# Patient Record
Sex: Female | Born: 1965
Health system: Southern US, Community
[De-identification: ages and names within clinical notes are randomized; demographics above are authoritative.]

## PROBLEM LIST (undated history)

## (undated) DIAGNOSIS — F32A Depression, unspecified: Secondary | ICD-10-CM

## (undated) DIAGNOSIS — G4733 Obstructive sleep apnea (adult) (pediatric): Secondary | ICD-10-CM

## (undated) DIAGNOSIS — G4730 Sleep apnea, unspecified: Secondary | ICD-10-CM

## (undated) DIAGNOSIS — Z87442 Personal history of urinary calculi: Secondary | ICD-10-CM

## (undated) DIAGNOSIS — M069 Rheumatoid arthritis, unspecified: Secondary | ICD-10-CM

## (undated) DIAGNOSIS — H409 Unspecified glaucoma: Secondary | ICD-10-CM

## (undated) DIAGNOSIS — G629 Polyneuropathy, unspecified: Secondary | ICD-10-CM

## (undated) DIAGNOSIS — E0500 Thyrotoxicosis with diffuse goiter without thyrotoxic crisis or storm: Secondary | ICD-10-CM

## (undated) DIAGNOSIS — I10 Essential (primary) hypertension: Secondary | ICD-10-CM

## (undated) DIAGNOSIS — Z8719 Personal history of other diseases of the digestive system: Secondary | ICD-10-CM

## (undated) DIAGNOSIS — Z8639 Personal history of other endocrine, nutritional and metabolic disease: Secondary | ICD-10-CM

## (undated) DIAGNOSIS — K3184 Gastroparesis: Secondary | ICD-10-CM

## (undated) DIAGNOSIS — R112 Nausea with vomiting, unspecified: Secondary | ICD-10-CM

## (undated) DIAGNOSIS — Z8619 Personal history of other infectious and parasitic diseases: Secondary | ICD-10-CM

## (undated) DIAGNOSIS — D509 Iron deficiency anemia, unspecified: Secondary | ICD-10-CM

## (undated) DIAGNOSIS — S62101A Fracture of unspecified carpal bone, right wrist, initial encounter for closed fracture: Secondary | ICD-10-CM

## (undated) DIAGNOSIS — F329 Major depressive disorder, single episode, unspecified: Secondary | ICD-10-CM

## (undated) DIAGNOSIS — K222 Esophageal obstruction: Secondary | ICD-10-CM

## (undated) DIAGNOSIS — K219 Gastro-esophageal reflux disease without esophagitis: Secondary | ICD-10-CM

## (undated) DIAGNOSIS — J45909 Unspecified asthma, uncomplicated: Secondary | ICD-10-CM

## (undated) DIAGNOSIS — E039 Hypothyroidism, unspecified: Secondary | ICD-10-CM

## (undated) DIAGNOSIS — Z9641 Presence of insulin pump (external) (internal): Secondary | ICD-10-CM

## (undated) DIAGNOSIS — E785 Hyperlipidemia, unspecified: Secondary | ICD-10-CM

## (undated) DIAGNOSIS — M810 Age-related osteoporosis without current pathological fracture: Secondary | ICD-10-CM

## (undated) DIAGNOSIS — E559 Vitamin D deficiency, unspecified: Secondary | ICD-10-CM

## (undated) DIAGNOSIS — J189 Pneumonia, unspecified organism: Secondary | ICD-10-CM

## (undated) DIAGNOSIS — Z9889 Other specified postprocedural states: Secondary | ICD-10-CM

## (undated) DIAGNOSIS — E119 Type 2 diabetes mellitus without complications: Secondary | ICD-10-CM

## (undated) DIAGNOSIS — E109 Type 1 diabetes mellitus without complications: Secondary | ICD-10-CM

## (undated) DIAGNOSIS — F419 Anxiety disorder, unspecified: Secondary | ICD-10-CM

## (undated) DIAGNOSIS — I739 Peripheral vascular disease, unspecified: Secondary | ICD-10-CM

## (undated) DIAGNOSIS — J449 Chronic obstructive pulmonary disease, unspecified: Secondary | ICD-10-CM

## (undated) DIAGNOSIS — R0600 Dyspnea, unspecified: Secondary | ICD-10-CM

## (undated) DIAGNOSIS — A048 Other specified bacterial intestinal infections: Secondary | ICD-10-CM

## (undated) DIAGNOSIS — R0609 Other forms of dyspnea: Secondary | ICD-10-CM

## (undated) DIAGNOSIS — N200 Calculus of kidney: Secondary | ICD-10-CM

## (undated) HISTORY — DX: Gastro-esophageal reflux disease without esophagitis: K21.9

## (undated) HISTORY — DX: Age-related osteoporosis without current pathological fracture: M81.0

## (undated) HISTORY — DX: Thyrotoxicosis with diffuse goiter without thyrotoxic crisis or storm: E05.00

## (undated) HISTORY — PX: FRACTURE SURGERY: SHX138

## (undated) HISTORY — PX: BREAST EXCISIONAL BIOPSY: SUR124

## (undated) HISTORY — DX: Hypothyroidism, unspecified: E03.9

## (undated) HISTORY — DX: Esophageal obstruction: K22.2

## (undated) HISTORY — DX: Vitamin D deficiency, unspecified: E55.9

## (undated) HISTORY — PX: UPPER GASTROINTESTINAL ENDOSCOPY: SHX188

## (undated) HISTORY — DX: Hyperlipidemia, unspecified: E78.5

## (undated) HISTORY — DX: Gastroparesis: K31.84

## (undated) HISTORY — DX: Calculus of kidney: N20.0

## (undated) HISTORY — DX: Sleep apnea, unspecified: G47.30

## (undated) HISTORY — PX: COLONOSCOPY: SHX174

## (undated) HISTORY — PX: BREAST LUMPECTOMY: SHX2

## (undated) HISTORY — PX: LAPAROSCOPIC ASSISTED VAGINAL HYSTERECTOMY: SHX5398

## (undated) HISTORY — DX: Essential (primary) hypertension: I10

## (undated) HISTORY — DX: Major depressive disorder, single episode, unspecified: F32.9

## (undated) HISTORY — DX: Depression, unspecified: F32.A

## (undated) HISTORY — DX: Polyneuropathy, unspecified: G62.9

## (undated) HISTORY — DX: Type 2 diabetes mellitus without complications: E11.9

## (undated) HISTORY — PX: BREAST SURGERY: SHX581

## (undated) HISTORY — DX: Anxiety disorder, unspecified: F41.9

## (undated) HISTORY — DX: Unspecified glaucoma: H40.9

## (undated) HISTORY — DX: Other specified bacterial intestinal infections: A04.8

## (undated) HISTORY — DX: Unspecified asthma, uncomplicated: J45.909

---

## 1998-05-18 ENCOUNTER — Other Ambulatory Visit: Admission: RE | Admit: 1998-05-18 | Discharge: 1998-05-18 | Payer: Self-pay | Admitting: Obstetrics and Gynecology

## 1998-07-08 ENCOUNTER — Ambulatory Visit (HOSPITAL_COMMUNITY): Admission: RE | Admit: 1998-07-08 | Discharge: 1998-07-08 | Payer: Self-pay | Admitting: Obstetrics and Gynecology

## 1999-07-09 ENCOUNTER — Inpatient Hospital Stay (HOSPITAL_COMMUNITY): Admission: EM | Admit: 1999-07-09 | Discharge: 1999-07-12 | Payer: Self-pay | Admitting: Emergency Medicine

## 1999-09-06 ENCOUNTER — Encounter: Admission: RE | Admit: 1999-09-06 | Discharge: 1999-09-06 | Payer: Self-pay | Admitting: Obstetrics and Gynecology

## 1999-09-06 ENCOUNTER — Encounter: Payer: Self-pay | Admitting: Obstetrics and Gynecology

## 1999-09-13 ENCOUNTER — Other Ambulatory Visit: Admission: RE | Admit: 1999-09-13 | Discharge: 1999-09-13 | Payer: Self-pay | Admitting: *Deleted

## 1999-09-18 ENCOUNTER — Ambulatory Visit (HOSPITAL_BASED_OUTPATIENT_CLINIC_OR_DEPARTMENT_OTHER): Admission: RE | Admit: 1999-09-18 | Discharge: 1999-09-18 | Payer: Self-pay | Admitting: *Deleted

## 1999-09-18 ENCOUNTER — Encounter (INDEPENDENT_AMBULATORY_CARE_PROVIDER_SITE_OTHER): Payer: Self-pay | Admitting: *Deleted

## 1999-09-27 ENCOUNTER — Other Ambulatory Visit: Admission: RE | Admit: 1999-09-27 | Discharge: 1999-09-27 | Payer: Self-pay | Admitting: Obstetrics and Gynecology

## 1999-10-17 ENCOUNTER — Encounter (INDEPENDENT_AMBULATORY_CARE_PROVIDER_SITE_OTHER): Payer: Self-pay | Admitting: Specialist

## 1999-10-17 ENCOUNTER — Other Ambulatory Visit: Admission: RE | Admit: 1999-10-17 | Discharge: 1999-10-17 | Payer: Self-pay | Admitting: Obstetrics and Gynecology

## 2000-02-29 ENCOUNTER — Encounter: Admission: RE | Admit: 2000-02-29 | Discharge: 2000-02-29 | Payer: Self-pay | Admitting: Internal Medicine

## 2000-02-29 ENCOUNTER — Encounter (HOSPITAL_BASED_OUTPATIENT_CLINIC_OR_DEPARTMENT_OTHER): Payer: Self-pay | Admitting: Internal Medicine

## 2000-04-22 ENCOUNTER — Other Ambulatory Visit: Admission: RE | Admit: 2000-04-22 | Discharge: 2000-04-22 | Payer: Self-pay | Admitting: Obstetrics and Gynecology

## 2000-10-17 ENCOUNTER — Other Ambulatory Visit: Admission: RE | Admit: 2000-10-17 | Discharge: 2000-10-17 | Payer: Self-pay | Admitting: Obstetrics and Gynecology

## 2001-02-26 ENCOUNTER — Other Ambulatory Visit: Admission: RE | Admit: 2001-02-26 | Discharge: 2001-02-26 | Payer: Self-pay | Admitting: Obstetrics and Gynecology

## 2001-04-09 ENCOUNTER — Encounter: Admission: RE | Admit: 2001-04-09 | Discharge: 2001-07-08 | Payer: Self-pay | Admitting: Internal Medicine

## 2001-12-02 ENCOUNTER — Other Ambulatory Visit: Admission: RE | Admit: 2001-12-02 | Discharge: 2001-12-02 | Payer: Self-pay | Admitting: Obstetrics and Gynecology

## 2003-01-08 ENCOUNTER — Other Ambulatory Visit: Admission: RE | Admit: 2003-01-08 | Discharge: 2003-01-08 | Payer: Self-pay | Admitting: Obstetrics and Gynecology

## 2004-03-10 ENCOUNTER — Other Ambulatory Visit: Admission: RE | Admit: 2004-03-10 | Discharge: 2004-03-10 | Payer: Self-pay | Admitting: Obstetrics and Gynecology

## 2004-06-11 HISTORY — PX: VAGINAL HYSTERECTOMY: SUR661

## 2004-06-22 ENCOUNTER — Encounter (INDEPENDENT_AMBULATORY_CARE_PROVIDER_SITE_OTHER): Payer: Self-pay | Admitting: *Deleted

## 2004-06-22 ENCOUNTER — Observation Stay (HOSPITAL_COMMUNITY): Admission: RE | Admit: 2004-06-22 | Discharge: 2004-06-23 | Payer: Self-pay | Admitting: Obstetrics and Gynecology

## 2005-06-14 ENCOUNTER — Other Ambulatory Visit: Admission: RE | Admit: 2005-06-14 | Discharge: 2005-06-14 | Payer: Self-pay | Admitting: Obstetrics and Gynecology

## 2008-06-11 DIAGNOSIS — N200 Calculus of kidney: Secondary | ICD-10-CM

## 2008-06-11 HISTORY — DX: Calculus of kidney: N20.0

## 2009-01-31 ENCOUNTER — Encounter: Admission: RE | Admit: 2009-01-31 | Discharge: 2009-01-31 | Payer: Self-pay | Admitting: Internal Medicine

## 2009-01-31 ENCOUNTER — Encounter (INDEPENDENT_AMBULATORY_CARE_PROVIDER_SITE_OTHER): Payer: Self-pay | Admitting: *Deleted

## 2009-02-23 ENCOUNTER — Encounter: Payer: Self-pay | Admitting: Internal Medicine

## 2009-02-28 ENCOUNTER — Ambulatory Visit: Payer: Self-pay | Admitting: Internal Medicine

## 2009-02-28 DIAGNOSIS — R1013 Epigastric pain: Secondary | ICD-10-CM | POA: Insufficient documentation

## 2009-02-28 DIAGNOSIS — R1310 Dysphagia, unspecified: Secondary | ICD-10-CM | POA: Insufficient documentation

## 2009-02-28 DIAGNOSIS — E1065 Type 1 diabetes mellitus with hyperglycemia: Secondary | ICD-10-CM | POA: Insufficient documentation

## 2009-02-28 DIAGNOSIS — K219 Gastro-esophageal reflux disease without esophagitis: Secondary | ICD-10-CM | POA: Insufficient documentation

## 2009-03-01 ENCOUNTER — Ambulatory Visit: Payer: Self-pay | Admitting: Internal Medicine

## 2009-03-02 ENCOUNTER — Telehealth (INDEPENDENT_AMBULATORY_CARE_PROVIDER_SITE_OTHER): Payer: Self-pay | Admitting: *Deleted

## 2009-03-03 ENCOUNTER — Encounter: Payer: Self-pay | Admitting: Internal Medicine

## 2009-03-06 ENCOUNTER — Emergency Department (HOSPITAL_COMMUNITY): Admission: EM | Admit: 2009-03-06 | Discharge: 2009-03-06 | Payer: Self-pay | Admitting: Emergency Medicine

## 2009-03-06 ENCOUNTER — Encounter (INDEPENDENT_AMBULATORY_CARE_PROVIDER_SITE_OTHER): Payer: Self-pay | Admitting: *Deleted

## 2009-03-07 ENCOUNTER — Telehealth: Payer: Self-pay | Admitting: Internal Medicine

## 2009-03-07 ENCOUNTER — Encounter: Payer: Self-pay | Admitting: Internal Medicine

## 2009-03-15 ENCOUNTER — Ambulatory Visit (HOSPITAL_COMMUNITY): Admission: RE | Admit: 2009-03-15 | Discharge: 2009-03-15 | Payer: Self-pay | Admitting: Internal Medicine

## 2009-03-17 ENCOUNTER — Telehealth (INDEPENDENT_AMBULATORY_CARE_PROVIDER_SITE_OTHER): Payer: Self-pay | Admitting: *Deleted

## 2009-03-22 ENCOUNTER — Ambulatory Visit: Payer: Self-pay | Admitting: Internal Medicine

## 2009-03-22 DIAGNOSIS — K3184 Gastroparesis: Secondary | ICD-10-CM | POA: Insufficient documentation

## 2009-03-22 DIAGNOSIS — R1319 Other dysphagia: Secondary | ICD-10-CM | POA: Insufficient documentation

## 2009-05-25 ENCOUNTER — Encounter: Admission: RE | Admit: 2009-05-25 | Discharge: 2009-05-25 | Payer: Self-pay | Admitting: Internal Medicine

## 2009-06-14 ENCOUNTER — Ambulatory Visit: Payer: Self-pay | Admitting: Internal Medicine

## 2009-06-28 ENCOUNTER — Encounter: Payer: Self-pay | Admitting: Internal Medicine

## 2010-01-07 ENCOUNTER — Inpatient Hospital Stay (HOSPITAL_COMMUNITY): Admission: EM | Admit: 2010-01-07 | Discharge: 2010-01-09 | Payer: Self-pay | Admitting: Emergency Medicine

## 2010-01-11 ENCOUNTER — Inpatient Hospital Stay (HOSPITAL_COMMUNITY): Admission: EM | Admit: 2010-01-11 | Discharge: 2010-01-13 | Payer: Self-pay | Admitting: Emergency Medicine

## 2010-02-09 DIAGNOSIS — E0500 Thyrotoxicosis with diffuse goiter without thyrotoxic crisis or storm: Secondary | ICD-10-CM

## 2010-02-09 DIAGNOSIS — E890 Postprocedural hypothyroidism: Secondary | ICD-10-CM

## 2010-02-09 HISTORY — DX: Postprocedural hypothyroidism: E89.0

## 2010-02-09 HISTORY — DX: Thyrotoxicosis with diffuse goiter without thyrotoxic crisis or storm: E05.00

## 2010-02-16 ENCOUNTER — Encounter (HOSPITAL_COMMUNITY)
Admission: RE | Admit: 2010-02-16 | Discharge: 2010-05-17 | Payer: Self-pay | Source: Home / Self Care | Admitting: Internal Medicine

## 2010-03-03 ENCOUNTER — Ambulatory Visit (HOSPITAL_COMMUNITY): Admission: RE | Admit: 2010-03-03 | Discharge: 2010-03-03 | Payer: Self-pay | Admitting: Internal Medicine

## 2010-03-21 ENCOUNTER — Encounter: Admission: RE | Admit: 2010-03-21 | Discharge: 2010-03-21 | Payer: Self-pay | Admitting: Obstetrics and Gynecology

## 2010-07-13 NOTE — Assessment & Plan Note (Signed)
Summary: GASTROPARESIS followup   History of Present Illness Visit Type: Follow-up Visit Primary GI MD: Yancey Flemings MD Primary Provider: Chilton Greathouse, MD Requesting Provider: NA Chief Complaint: follow up gastroparesis, pt states she is still vomiting, but not as bad History of Present Illness:   45 year old female with long-standing insulin requiring diabetes mellitus, hyperlipidemia, osteoporosis, and anxiety disorder. She has been followed in this office in recent months for diabetic gastroparesis and GERD. She presents today for followup. Her current GI medications include Protonix 40 mg b.i.d., promethazine 25 mg p.r.n. (generally takes twice per day), Zofran 4 mg p.r.n. (generally once per week if having difficulty with nausea work), and metoclopramide 10 mg before meals and at night. Since her last visit she reports improvement in symptoms. She continues with daily nausea. Rare episodes of vomiting. No palmar pain or reflux symptoms. No new problems. Her appetite is stable. She has lost about 3 pounds since her last visit in mid October. Her last hemoglobin A1c was 6.9% by her report. Some fluctuating blood sugars this week.   GI Review of Systems    Reports acid reflux, nausea, vomiting, and  weight loss.      Denies abdominal pain, belching, bloating, chest pain, dysphagia with liquids, dysphagia with solids, heartburn, loss of appetite, vomiting blood, and  weight gain.        Denies anal fissure, black tarry stools, change in bowel habit, constipation, diarrhea, diverticulosis, fecal incontinence, heme positive stool, hemorrhoids, irritable bowel syndrome, jaundice, light color stool, liver problems, rectal bleeding, and  rectal pain.    Current Medications (verified): 1)  Alprazolam 0.25 Mg Tabs (Alprazolam) .... Take As Needed 2)  Allegra 180 Mg Tabs (Fexofenadine Hcl) .... Take As Needed 3)  Promethazine Hcl 25 Mg Tabs (Promethazine Hcl) .... Take As Needed Nausea 4)  Novolog  Insulin Pump .... Use As Directed 5)  Cozaar 25 Mg Tabs (Losartan Potassium) .Marland Kitchen.. 1 Tablet By Mouth Once Daily 6)  Protonix 40 Mg Tbec (Pantoprazole Sodium) .... Two Times A Day 7)  Vitamin D (Ergocalciferol) 50000 Unit Caps (Ergocalciferol) .Marland Kitchen.. 1 Time Weekly 8)  Zofran 4 Mg Tabs (Ondansetron Hcl) .... Take 1 P.o. Every 4 Hrs. As Needed 9)  Metoclopramide Hcl 10 Mg Tabs (Metoclopramide Hcl) .... Take 1 P.o. One Half Hr. A.c. and At Bedtime  Allergies (verified): 1)  ! Lipitor (Atorvastatin) 2)  ! Sulfa 3)  ! Clindamycin  Past History:  Past Medical History: Reviewed history from 03/22/2009 and no changes required. Diabetes Anxiety Disorder Osteoporosis Hyperlipidemia GERD H-Pylori positive Vitamin D Deficiency gastroparesis Esophageal Stricture  Past Surgical History: Reviewed history from 02/28/2009 and no changes required. Hysterectomy  Family History: Reviewed history from 03/22/2009 and no changes required. Family History of Diabetes: Father No FH of Colon Cancer  Social History: Reviewed history from 03/22/2009 and no changes required. Occupation: Associate Professor Patient currently smokes. <1/2 ppd Alcohol Use - no Illicit Drug Use - no Patient does not get regular exercise.   Review of Systems       fatigue, headaches, anxiety, insomnia. All other systems reviewed and reported to be negative  Vital Signs:  Patient profile:   45 year old female Height:      59 inches Weight:      110 pounds BMI:     22.30 Pulse rate:   64 / minute Pulse rhythm:   regular BP sitting:   100 / 60  (left arm) Cuff size:   regular  Vitals Entered  By: Francee Piccolo CMA Duncan Dull) (June 14, 2009 3:41 PM)  Physical Exam  General:  Well developed, well nourished, no acute distress. Head:  Normocephalic and atraumatic. Eyes:  PERRLA, no icterus. Mouth:  No deformity or lesions, dentition normal.no thrush Lungs:  Clear throughout to auscultation. Heart:  Regular rate and  rhythm; no murmurs, rubs,  or bruits. Abdomen:  Soft, nontender and nondistended. No masses, hepatosplenomegaly or hernias noted. Normal bowel sounds.no succussion splash Pulses:  Normal pulses noted. Extremities:  no edema Neurologic:  Alert and  oriented x4; Skin:  no jaundice Psych:  Alert and cooperative. Normal mood and affect.   Impression & Recommendations:  Problem # 1:  GASTROPARESIS (ICD-536.3) Assessment New Diabetic gastroparesis. Patient continues with nausea and rare vomiting. Overall improved. I again discussed with her today the potential for serious neurologic side effects (i.e. tardive dyskinesia) with long-term metoclopramide use. I suggested that she might consider holding the medication to see if her condition deteriorates. I also offered her the opportunity to be seen at [facility] by Dr. Beverly Gust and associates (gastroparesis experts). At this point, she is most comfortable with, and prefers continuing her current medical regimen without change. As such, I would like to see her back for routine followup in about 6 months. She knows to contact the office in the interim for any questions or problems.  Problem # 2:  GERD (ICD-530.81) problems with pyrosis and epigastric pain resolved on proton pump inhibitor therapy. Continue proton pump inhibitor therapy.  Problem # 3:  DYSPHAGIA (ICD-787.29) dysphagia secondary to peptic stricture. Dysphagia resolved post esophageal dilation. Continue proton pump inhibitor therapy and repeat esophageal dilation as needed for recurrent dysphagia  Patient Instructions: 1)  copy: Dr. Larina Earthly

## 2010-07-13 NOTE — Medication Information (Signed)
Summary: Metoclopramide 90 d supply/[facility]  Metoclopramide 90 d supply/[facility]   Imported By: Lester [place] 06/29/2009 10:15:00  _____________________________________________________________________  External Attachment:    Type:   Image     Comment:   External Document

## 2010-08-24 LAB — HCG, SERUM, QUALITATIVE: Preg, Serum: NEGATIVE

## 2010-08-25 LAB — DIFFERENTIAL
Basophils Absolute: 0 10*3/uL (ref 0.0–0.1)
Basophils Relative: 0 % (ref 0–1)
Eosinophils Absolute: 0 10*3/uL (ref 0.0–0.7)
Eosinophils Relative: 0 % (ref 0–5)
Lymphocytes Relative: 5 % — ABNORMAL LOW (ref 12–46)
Lymphs Abs: 1.5 10*3/uL (ref 0.7–4.0)
Monocytes Absolute: 1.8 K/uL — ABNORMAL HIGH (ref 0.1–1.0)
Monocytes Relative: 6 % (ref 3–12)
Neutro Abs: 26.1 10*3/uL — ABNORMAL HIGH (ref 1.7–7.7)
Neutrophils Relative %: 89 % — ABNORMAL HIGH (ref 43–77)

## 2010-08-25 LAB — BASIC METABOLIC PANEL
BUN: 15 mg/dL (ref 6–23)
BUN: 5 mg/dL — ABNORMAL LOW (ref 6–23)
BUN: 5 mg/dL — ABNORMAL LOW (ref 6–23)
BUN: 7 mg/dL (ref 6–23)
BUN: 8 mg/dL (ref 6–23)
BUN: 8 mg/dL (ref 6–23)
CO2: 10 mEq/L — ABNORMAL LOW (ref 19–32)
CO2: 11 mEq/L — ABNORMAL LOW (ref 19–32)
CO2: 18 mEq/L — ABNORMAL LOW (ref 19–32)
CO2: 19 mEq/L (ref 19–32)
CO2: 19 mEq/L (ref 19–32)
CO2: 21 mEq/L (ref 19–32)
CO2: 23 mEq/L (ref 19–32)
Calcium: 7.6 mg/dL — ABNORMAL LOW (ref 8.4–10.5)
Calcium: 8.2 mg/dL — ABNORMAL LOW (ref 8.4–10.5)
Calcium: 8.2 mg/dL — ABNORMAL LOW (ref 8.4–10.5)
Calcium: 9.1 mg/dL (ref 8.4–10.5)
Chloride: 102 mEq/L (ref 96–112)
Chloride: 111 mEq/L (ref 96–112)
Chloride: 112 mEq/L (ref 96–112)
Chloride: 114 mEq/L — ABNORMAL HIGH (ref 96–112)
Chloride: 116 mEq/L — ABNORMAL HIGH (ref 96–112)
Chloride: 119 mEq/L — ABNORMAL HIGH (ref 96–112)
Creatinine, Ser: 0.47 mg/dL (ref 0.4–1.2)
Creatinine, Ser: 0.6 mg/dL (ref 0.4–1.2)
Creatinine, Ser: 0.67 mg/dL (ref 0.4–1.2)
Creatinine, Ser: 0.83 mg/dL (ref 0.4–1.2)
Creatinine, Ser: 1.14 mg/dL (ref 0.4–1.2)
GFR calc Af Amer: >60 mL/min (ref >60)
GFR calc Af Amer: >60 mL/min (ref >60)
GFR calc non Af Amer: >60 mL/min (ref >60)
GFR calc non Af Amer: >60 mL/min (ref >60)
Glucose, Bld: 121 mg/dL — ABNORMAL HIGH (ref 70–99)
Glucose, Bld: 139 mg/dL — ABNORMAL HIGH (ref 70–99)
Glucose, Bld: 163 mg/dL — ABNORMAL HIGH (ref 70–99)
Glucose, Bld: 241 mg/dL — ABNORMAL HIGH (ref 70–99)
Glucose, Bld: 241 mg/dL — ABNORMAL HIGH (ref 70–99)
Glucose, Bld: 313 mg/dL — ABNORMAL HIGH (ref 70–99)
Glucose, Bld: 574 mg/dL (ref 70–99)
Glucose, Bld: 81 mg/dL (ref 70–99)
Potassium: 3.7 mEq/L (ref 3.5–5.1)
Potassium: 3.9 mEq/L (ref 3.5–5.1)
Potassium: 4 mEq/L (ref 3.5–5.1)
Potassium: 4.1 mEq/L (ref 3.5–5.1)
Sodium: 137 mEq/L (ref 135–145)
Sodium: 138 mEq/L (ref 135–145)
Sodium: 138 mEq/L (ref 135–145)
Sodium: 139 mEq/L (ref 135–145)

## 2010-08-25 LAB — GLUCOSE, CAPILLARY
Glucose-Capillary: 102 mg/dL — ABNORMAL HIGH (ref 70–99)
Glucose-Capillary: 104 mg/dL — ABNORMAL HIGH (ref 70–99)
Glucose-Capillary: 108 mg/dL — ABNORMAL HIGH (ref 70–99)
Glucose-Capillary: 109 mg/dL — ABNORMAL HIGH (ref 70–99)
Glucose-Capillary: 185 mg/dL — ABNORMAL HIGH (ref 70–99)
Glucose-Capillary: 194 mg/dL — ABNORMAL HIGH (ref 70–99)
Glucose-Capillary: 197 mg/dL — ABNORMAL HIGH (ref 70–99)
Glucose-Capillary: 202 mg/dL — ABNORMAL HIGH (ref 70–99)
Glucose-Capillary: 226 mg/dL — ABNORMAL HIGH (ref 70–99)
Glucose-Capillary: 238 mg/dL — ABNORMAL HIGH (ref 70–99)
Glucose-Capillary: 238 mg/dL — ABNORMAL HIGH (ref 70–99)
Glucose-Capillary: 239 mg/dL — ABNORMAL HIGH (ref 70–99)
Glucose-Capillary: 466 mg/dL — ABNORMAL HIGH (ref 70–99)
Glucose-Capillary: 76 mg/dL (ref 70–99)
Glucose-Capillary: >600 mg/dL (ref 70–99)

## 2010-08-25 LAB — CBC
HCT: 29.2 % — ABNORMAL LOW (ref 36.0–46.0)
HCT: 30 % — ABNORMAL LOW (ref 36.0–46.0)
HCT: 40 % (ref 36.0–46.0)
Hemoglobin: 13.6 g/dL (ref 12.0–15.0)
Hemoglobin: 9.9 g/dL — ABNORMAL LOW (ref 12.0–15.0)
MCH: 30.5 pg (ref 26.0–34.0)
MCHC: 33.9 g/dL (ref 30.0–36.0)
MCHC: 34.1 g/dL (ref 30.0–36.0)
MCV: 88.3 fL (ref 78.0–100.0)
MCV: 89.3 fL (ref 78.0–100.0)
MCV: 89.5 fL (ref 78.0–100.0)
Platelets: 422 10*3/uL — ABNORMAL HIGH (ref 150–400)
RBC: 3.4 MIL/uL — ABNORMAL LOW (ref 3.87–5.11)
RBC: 4.47 MIL/uL (ref 3.87–5.11)
RDW: 12.4 % (ref 11.5–15.5)
RDW: 12.5 % (ref 11.5–15.5)
RDW: 12.7 % (ref 11.5–15.5)
WBC: 16.5 10*3/uL — ABNORMAL HIGH (ref 4.0–10.5)
WBC: 29.4 10*3/uL — ABNORMAL HIGH (ref 4.0–10.5)
WBC: 8.6 10*3/uL (ref 4.0–10.5)

## 2010-08-25 LAB — URINE MICROSCOPIC-ADD ON

## 2010-08-25 LAB — MRSA PCR SCREENING: MRSA by PCR: NEGATIVE

## 2010-08-25 LAB — URINALYSIS, ROUTINE W REFLEX MICROSCOPIC
Bilirubin Urine: NEGATIVE
Glucose, UA: >1000 mg/dL — AB
Ketones, ur: >80 mg/dL — AB
Leukocytes, UA: NEGATIVE
Nitrite: NEGATIVE
Protein, ur: NEGATIVE mg/dL
Specific Gravity, Urine: 1.022 (ref 1.005–1.030)
Urobilinogen, UA: 0.2 mg/dL (ref 0.0–1.0)
pH: 5 (ref 5.0–8.0)

## 2010-08-25 LAB — CARDIAC PANEL(CRET KIN+CKTOT+MB+TROPI)
CK, MB: 3.2 ng/mL (ref 0.3–4.0)
Relative Index: INVALID (ref 0.0–2.5)
Troponin I: 0.08 ng/mL — ABNORMAL HIGH (ref 0.00–0.06)

## 2010-08-25 LAB — BLOOD GAS, VENOUS: TCO2: 10 mmol/L (ref 0–100)

## 2010-08-25 LAB — BASIC METABOLIC PANEL WITH GFR
BUN: 19 mg/dL (ref 6–23)
Calcium: 9.5 mg/dL (ref 8.4–10.5)
GFR calc Af Amer: >60 mL/min (ref >60)
GFR calc non Af Amer: 52 mL/min — ABNORMAL LOW (ref >60)
Potassium: 4.8 meq/L (ref 3.5–5.1)

## 2010-08-25 LAB — CULTURE, BLOOD (ROUTINE X 2)

## 2010-08-26 LAB — GLUCOSE, CAPILLARY
Glucose-Capillary: 101 mg/dL — ABNORMAL HIGH (ref 70–99)
Glucose-Capillary: 121 mg/dL — ABNORMAL HIGH (ref 70–99)
Glucose-Capillary: 125 mg/dL — ABNORMAL HIGH (ref 70–99)
Glucose-Capillary: 136 mg/dL — ABNORMAL HIGH (ref 70–99)
Glucose-Capillary: 138 mg/dL — ABNORMAL HIGH (ref 70–99)
Glucose-Capillary: 155 mg/dL — ABNORMAL HIGH (ref 70–99)
Glucose-Capillary: 168 mg/dL — ABNORMAL HIGH (ref 70–99)
Glucose-Capillary: 172 mg/dL — ABNORMAL HIGH (ref 70–99)
Glucose-Capillary: 225 mg/dL — ABNORMAL HIGH (ref 70–99)
Glucose-Capillary: 288 mg/dL — ABNORMAL HIGH (ref 70–99)
Glucose-Capillary: 60 mg/dL — ABNORMAL LOW (ref 70–99)

## 2010-08-26 LAB — URINALYSIS, ROUTINE W REFLEX MICROSCOPIC
Glucose, UA: >1000 mg/dL — AB
Ketones, ur: >80 mg/dL — AB
Leukocytes, UA: NEGATIVE
Nitrite: NEGATIVE
Specific Gravity, Urine: 1.026 (ref 1.005–1.030)
pH: 6 (ref 5.0–8.0)

## 2010-08-26 LAB — BLOOD GAS, ARTERIAL
Acid-base deficit: 9.4 mmol/L — ABNORMAL HIGH (ref 0.0–2.0)
Drawn by: 235321
Patient temperature: 98.6
TCO2: 13.5 mmol/L (ref 0–100)
pH, Arterial: 7.344 — ABNORMAL LOW (ref 7.350–7.400)

## 2010-08-26 LAB — CBC
HCT: 36.3 % (ref 36.0–46.0)
Hemoglobin: 12.7 g/dL (ref 12.0–15.0)
MCH: 30.5 pg (ref 26.0–34.0)
MCV: 86.9 fL (ref 78.0–100.0)
Platelets: 265 10*3/uL (ref 150–400)
Platelets: 272 10*3/uL (ref 150–400)
RBC: 3.65 MIL/uL — ABNORMAL LOW (ref 3.87–5.11)
RBC: 4.17 MIL/uL (ref 3.87–5.11)
RDW: 12.3 % (ref 11.5–15.5)
WBC: 9.3 10*3/uL (ref 4.0–10.5)
WBC: 9.9 10*3/uL (ref 4.0–10.5)

## 2010-08-26 LAB — DIFFERENTIAL
Basophils Absolute: 0 10*3/uL (ref 0.0–0.1)
Basophils Relative: 0 % (ref 0–1)
Eosinophils Relative: 0 % (ref 0–5)
Monocytes Absolute: 0.4 10*3/uL (ref 0.1–1.0)
Neutro Abs: 7.3 10*3/uL (ref 1.7–7.7)

## 2010-08-26 LAB — COMPREHENSIVE METABOLIC PANEL
Albumin: 4 g/dL (ref 3.5–5.2)
Alkaline Phosphatase: 73 U/L (ref 39–117)
BUN: 7 mg/dL (ref 6–23)
CO2: 22 mEq/L (ref 19–32)
Chloride: 100 mEq/L (ref 96–112)
Creatinine, Ser: 0.69 mg/dL (ref 0.4–1.2)
GFR calc non Af Amer: >60 mL/min (ref >60)
Potassium: 3.8 mEq/L (ref 3.5–5.1)
Total Bilirubin: 1 mg/dL (ref 0.3–1.2)

## 2010-08-26 LAB — BASIC METABOLIC PANEL
BUN: 2 mg/dL — ABNORMAL LOW (ref 6–23)
CO2: 22 mEq/L (ref 19–32)
Calcium: 8.5 mg/dL (ref 8.4–10.5)
Creatinine, Ser: 0.63 mg/dL (ref 0.4–1.2)
GFR calc Af Amer: >60 mL/min (ref >60)
GFR calc non Af Amer: >60 mL/min (ref >60)
Glucose, Bld: 221 mg/dL — ABNORMAL HIGH (ref 70–99)
Potassium: 4 mEq/L (ref 3.5–5.1)

## 2010-08-26 LAB — RAPID URINE DRUG SCREEN, HOSP PERFORMED
Barbiturates: NOT DETECTED
Benzodiazepines: POSITIVE — AB

## 2010-08-26 LAB — LACTIC ACID, PLASMA: Lactic Acid, Venous: 1.2 mmol/L (ref 0.5–2.2)

## 2010-08-26 LAB — POCT CARDIAC MARKERS
CKMB, poc: 1.3 ng/mL (ref 1.0–8.0)
Troponin i, poc: <0.05 ng/mL (ref 0.00–0.09)

## 2010-08-26 LAB — LIPASE, BLOOD: Lipase: 24 U/L (ref 11–59)

## 2010-08-26 LAB — URINE MICROSCOPIC-ADD ON

## 2010-08-26 LAB — KETONES, QUALITATIVE

## 2010-09-15 LAB — BASIC METABOLIC PANEL
BUN: 10 mg/dL (ref 6–23)
Chloride: 103 mEq/L (ref 96–112)
Creatinine, Ser: 0.71 mg/dL (ref 0.4–1.2)
Glucose, Bld: 236 mg/dL — ABNORMAL HIGH (ref 70–99)
Potassium: 3.6 mEq/L (ref 3.5–5.1)

## 2010-09-15 LAB — GLUCOSE, CAPILLARY
Glucose-Capillary: 125 mg/dL — ABNORMAL HIGH (ref 70–99)
Glucose-Capillary: 130 mg/dL — ABNORMAL HIGH (ref 70–99)
Glucose-Capillary: 164 mg/dL — ABNORMAL HIGH (ref 70–99)
Glucose-Capillary: 165 mg/dL — ABNORMAL HIGH (ref 70–99)
Glucose-Capillary: 18 mg/dL — CL (ref 70–99)
Glucose-Capillary: 187 mg/dL — ABNORMAL HIGH (ref 70–99)
Glucose-Capillary: 353 mg/dL — ABNORMAL HIGH (ref 70–99)

## 2010-09-15 LAB — HEPATIC FUNCTION PANEL
Albumin: 4 g/dL (ref 3.5–5.2)
Alkaline Phosphatase: 72 U/L (ref 39–117)
Total Protein: 6.7 g/dL (ref 6.0–8.3)

## 2010-09-15 LAB — CBC
HCT: 42.3 % (ref 36.0–46.0)
MCHC: 33.5 g/dL (ref 30.0–36.0)
MCV: 91.1 fL (ref 78.0–100.0)
Platelets: 286 10*3/uL (ref 150–400)
RDW: 12.2 % (ref 11.5–15.5)
WBC: 7.9 10*3/uL (ref 4.0–10.5)

## 2010-09-15 LAB — URINALYSIS, ROUTINE W REFLEX MICROSCOPIC
Bilirubin Urine: NEGATIVE
Hgb urine dipstick: NEGATIVE
Ketones, ur: NEGATIVE mg/dL
Nitrite: NEGATIVE
Protein, ur: NEGATIVE mg/dL
Specific Gravity, Urine: 1.03 (ref 1.005–1.030)
Urobilinogen, UA: 0.2 mg/dL (ref 0.0–1.0)

## 2010-09-15 LAB — DIFFERENTIAL
Basophils Absolute: 0.1 10*3/uL (ref 0.0–0.1)
Basophils Relative: 1 % (ref 0–1)
Eosinophils Absolute: 0 10*3/uL (ref 0.0–0.7)
Eosinophils Relative: 1 % (ref 0–5)
Neutrophils Relative %: 66 % (ref 43–77)

## 2010-09-15 LAB — LIPASE, BLOOD: Lipase: 23 U/L (ref 11–59)

## 2010-09-15 LAB — URINE MICROSCOPIC-ADD ON

## 2010-10-27 NOTE — Op Note (Signed)
Ashley [name], Ashley [name]             ACCOUNT NO.:  [id number]   MEDICAL RECORD NO.:  [id number]          PATIENT TYPE:  OBV   LOCATION:  9399                          FACILITY:  [facility]   PHYSICIAN:  Duke Salvia. Marcelle Overlie, M.D.DATE OF BIRTH:  06-17-1965   DATE OF PROCEDURE:  06/22/2004  DATE OF DISCHARGE:                                 OPERATIVE REPORT   PREOPERATIVE DIAGNOSIS:  Abnormal uterine bleeding.   POSTOPERATIVE DIAGNOSIS:  Abnormal uterine bleeding.   PROCEDURE:  Laparoscopically assisted vaginal hysterectomy.   SURGEON:  Duke Salvia. Marcelle Overlie, M.D.   ASSISTANT:  Malva Limes, M.D.   SPECIMENS REMOVED:  Uterus.   ESTIMATED BLOOD LOSS:  200 mL.   DESCRIPTION OF PROCEDURE/FINDINGS:  The patient was taken to the operating  room.  After an adequate level of general endotracheal anesthesia was  obtained with the patient's legs in stirrups, the abdomen, perineum and  vagina were prepped and draped in the usual manner for a laparoscopy.  Bladder was drained.  A UA was carried out.  A Hulka tenaculum was  positioned.  Uterus mid position, normal size.  Adnexa negative.  Attention  directed to the abdomen where a 2 cm subumbilical incision was made.  The  Veress needle  was introduced without difficulty.  Its intra-abdominal  position was verified by pressure and water testing.  After a 2.5 liter  pneumoperitoneum was then created, laparoscopic trocar introduced without  difficulty.  Three fingerbreadths above the midline, a 5 mm trocar was  inserted for manipulation.  The pelvic findings were as follows:   The uterus itself was normal size.  The anterior space and cul-de-sac were  free and clear.  Bilateral adnexa unremarkable.  Upper abdomen normal.  After these findings were noted, the Gyrus PK coagulating/cutting instrument  was used to coagulate and cut the utero-ovarian pedicle on each side down to  and including the round ligament on each side.  This was hemostatic.  This  portion of the procedure was completed.  Attention directed to the vaginal  portion of the procedure.   The patient's legs were extended.  Weighted speculum was positioned.  Cervix  grasped with a tenaculum.  Cervicovaginal mucosa was incised.  Posterior  colpotomy performed without difficulty.  The bladder was advanced superiorly  with sharp and blunt dissection.  The LigaSure coagulating instrument was  then used to coagulate the uterosacral ligaments.  These were divided.  The  peritoneum was identified, entered without difficulty and the retractor was  positioned in the anterior space to gently elevate the bladder out of the  field.  Once this was completed, the cardinal ligament and uterine  vasculature pedicles were coagulated and cut.  The fundus of the uterus was  then delivered posteriorly.  The remaining pedicle was clamped, divided,  first free tie followed by suture ligature of Vicryl.  All major pedicles  were inspected and noted to be hemostatic.  The cuff was closed with a  running locked 2-0 Vicryl suture.  Prior to closure, sponge, needle and  instrument counts were reported as correct x2.  The vaginal mucosa was  then  closed right-to-left with interrupted 2-0 Monocryl sutures.  Foley catheter  positioned, draining clear urine.  The abdomen was reinsufflated.  There  were several small oozing vessels along the vaginal cuff which were  coagulated.  The pelvis was then irrigated with saline and aspirated, noted  to be hemostatic.  Gas pressure was let down into the 3 to 4 range.  Further  inspection revealed no bleeding.  Instruments were removed.  Gas allowed to  escape.  Defects closed with 4-0 Dexon subcuticular sutures and Dermabond.  She tolerated this well and went to the recovery room in good condition.     Rich   RMH/MEDQ  D:  06/22/2004  T:  06/22/2004  Job:  [id number]

## 2010-10-27 NOTE — Op Note (Signed)
[facility]  Patient:    Ashley [name]                     MRN: [id number] Proc. Date: 09/18/99 Adm. Date:  [id number] Disc. Date: [id number] Attending:  Fritzi Mandes                           Operative Report  PREOPERATIVE DIAGNOSIS:  Left breast mass.  POSTOPERATIVE DIAGNOSIS:  Left breast mass.  OPERATION PERFORMED:  Excisional left breast biopsy.  SURGEON:  Stephenie Acres, M.D.  ANESTHESIA:  Local MAC.  DESCRIPTION OF PROCEDURE:  The patient was taken to the operating room and placed in supine position.  After adequate anesthesia was induced using MAC technique, the left breast was prepped and draped in normal sterile fashion. Using 1% lidocaine local anesthesia, the skin and subcutaneous tissues overlying the 2 oclock region of the left breast was anesthetized.  A curvilinear incision was made dissected down.  The mass was excised down to a great deal of fibrous tissue and sent for pathologic evaluation.  Margins were marked.  Adequate hemostasis was ensured.  Skin was closed with subcuticular 4-0 Monocryl.  Steri-Strips and sterile dressing was applied.  The patient tolerated the procedure well and went to PACU in good condition. DD:  09/18/99 TD:  09/18/99 Job: [id number]

## 2010-10-27 NOTE — Discharge Summary (Signed)
Ashley [name], Ashley [name]             ACCOUNT NO.:  [id number]   MEDICAL RECORD NO.:  [id number]          PATIENT TYPE:  OBV   LOCATION:  9310                          [facility]ACILITY:  [facility]   PHYSICIAN:  Duke Salvia. Marcelle Overlie, M.D.DATE OF BIRTH:  [birth year]   DATE OF ADMISSION:  06/22/2004  DATE OF DISCHARGE:                                 DISCHARGE SUMMARY   DISCHARGE DIAGNOSES:  1.  Abnormal uterine bleeding.  2.  Laparoscopic-assisted vaginal hysterectomy this admission.   SUMMARY OF THE HISTORY AND PHYSICAL EXAMINATION:  Please see admission H&P  for details.  Briefly, a 45 year old G1 P1, previous tubal ligation with  persistent menorrhagia presents for hysterectomy.   HOSPITAL COURSE:  On June 22, 2004 under general anesthesia the patient  underwent LAVH.  EBL was 200 mL.  Her ovaries were unremarkable.  Because of  her insulin-dependent diabetes she was managed postoperatively with the  Glucommander protocol.  Early a.m. postoperative day #1 she had some  elevated sugars in the 300 range and was given a bolus of regular insulin.  She was very adept at managing her insulin pump and will be discharged on  her normal diet with increased CBG monitoring today.  She is familiar with  rebolusing to get her glucose back into the acceptable range.   At the time of discharge she was voiding without difficulty, was afebrile,  was tolerating a regular diet, and ambulating without difficulty.   LABORATORY DATA:  Preoperative hemoglobin was 14.5, postoperative was 10.8.  CMET on admission was normal except for glucose of 108, BUN was 5.  Blood  type is A negative, antibody screen was negative.  UPT was negative.  Urine  was normal except for a urine glucose that was elevated.   DISPOSITION:  The patient discharged on Hemocyte once daily, Tylox p.r.n.  pain.  Will return to the office in 1 week.  Advised to report any  incisional redness or drainage, increased pain or bleeding, or fever over  101.  She was also given specific instructions regarding diet, sex,  exercise.  She will resume her insulin pump while she is still here in the  hospital before discharge.   CONDITION:  Good.   ACTIVITY:  Graded increase.     Rich   RMH/MEDQ  D:  06/23/2004  T:  06/23/2004  Job:  [id number]

## 2010-10-27 NOTE — H&P (Signed)
NAMECHYENNE, Ashley [name]             ACCOUNT NO.:  [id number]   MEDICAL RECORD NO.:  [id number]          PATIENT TYPE:  AMB   LOCATION:  SDC                           [facility]ACILITY:  [facility]   PHYSICIAN:  Duke Salvia. Marcelle Overlie, M.D.DATE OF BIRTH:  08/14/1965   DATE OF ADMISSION:  DATE OF DISCHARGE:                                HISTORY & PHYSICAL   DATE OF SURGERY:  June 22, 2004.   CHIEF COMPLAINT:  Persistent abnormal uterine bleeding.   HISTORY OF PRESENT ILLNESS:  A 45 year old, G1, P1, with previous tubal  ligation and persistent menorrhagia presents for hysterectomy.  This patient  underwent [facility] March 23, 2004 that showed normal cavity.  There was a  simple cyst on the left ovary.  No other abnormalities were noted.  We have  reviewed other options such as OCPs, Mirena, Depo Provera or endometrial  ablation.  She would prefer to proceed with definitive hysterectomy.  This  procedure, including risks relative to bleeding, infection, transfusion,  adjacent organ injury, the possible need for open or additional surgery all  reviewed with her, which she understands and accepts.   PAST MEDICAL HISTORY:   ALLERGIES:  1.  CLINDAMYCIN.  2.  SULFA.   CURRENT MEDICATIONS:  Effexor, NovoLog insulin, Zocor, Actonel and Xanax  p.r.n.   REVIEW OF SYSTEMS:  Significant for diabetes, smokes one half PPD,  osteoporosis, currently on Fosamax.  At the time of her tubal in 1999, the  pelvic findings were unremarkable.   PAST SURGICAL HISTORY:  1.  Cesarean section in 1988.  2.  Excisional breast biopsy for benign disease.   PHYSICAL EXAMINATION:  VITAL SIGNS:  Temperature 98.2; blood pressure  120/72.  HEENT:  Unremarkable.  NECK:  Supple without masses.  LUNGS:  Clear.  CARDIOVASCULAR:  Regular rate and rhythm without murmurs, rubs or gallops.  BREASTS:  Without masses.  ABDOMEN:  Soft, flat and nontender.  PELVIC:  Normal external genitalia.  Vagina and cervix clear.  Uterus is in  mid  position, normal size.  Adnexa are negative.  EXTREMITIES:  Unremarkable.  NEUROLOGIC:  Unremarkable.   IMPRESSION:  Menorrhagia.   PLAN:  LAVH procedure and risks reviewed as above.     Rich   RMH/MEDQ  D:  06/20/2004  T:  06/20/2004  Job:  [id number]

## 2012-10-13 ENCOUNTER — Encounter: Payer: Self-pay | Admitting: Internal Medicine

## 2012-10-22 ENCOUNTER — Telehealth: Payer: Self-pay | Admitting: Internal Medicine

## 2012-10-22 ENCOUNTER — Ambulatory Visit (INDEPENDENT_AMBULATORY_CARE_PROVIDER_SITE_OTHER): Payer: BC Managed Care – PPO | Admitting: Gastroenterology

## 2012-10-22 VITALS — BP 108/70 | HR 100 | Ht 59.0 in | Wt 136.0 lb

## 2012-10-22 DIAGNOSIS — K3184 Gastroparesis: Secondary | ICD-10-CM

## 2012-10-22 DIAGNOSIS — R112 Nausea with vomiting, unspecified: Secondary | ICD-10-CM | POA: Insufficient documentation

## 2012-10-22 DIAGNOSIS — R197 Diarrhea, unspecified: Secondary | ICD-10-CM

## 2012-10-22 MED ORDER — METOCLOPRAMIDE HCL 10 MG PO TABS: ORAL_TABLET | ORAL | Status: DC

## 2012-10-22 MED ORDER — DEXLANSOPRAZOLE 60 MG PO CPDR: 60.0000 mg | DELAYED_RELEASE_CAPSULE | Freq: Every day | ORAL | Status: DC

## 2012-10-22 NOTE — Telephone Encounter (Signed)
Pt has been having nausea, vomiting, and diarrhea for 4 days now. Pt has been unresponsive to reglan. Requesting pt be seen sooner than scheduled appt. Pt scheduled to see Doug Sou PA today at 2:15pm. Malachi Bonds to fax records and notify pt of appt date and time.

## 2012-10-22 NOTE — Patient Instructions (Addendum)
Your physician has requested that you go to the basement for the following lab work before leaving today: C. Diff and H. Pylori antigen.  Stop Protonix.   We have sent the following medications to your pharmacy for you to pick up at your convenience: Reglan and Dexilant. Start Dexilant samples one tablet by mouth twice daily x 2 weeks then decrease back down to once daily.

## 2012-10-23 ENCOUNTER — Encounter: Payer: Self-pay | Admitting: Gastroenterology

## 2012-10-23 NOTE — Progress Notes (Signed)
10/23/2012 Ashley [name] [id number] 1965/12/30   HISTORY OF PRESENT ILLNESS:  Patient is a 47 year old female who is a patient of Dr. Lamar Sprinkles.  She has long-standing insulin dependent diabetes and diabetic gastroparesis.  She was previously taking Reglan 10 mg ACHS, but a while ago she had been feeling well for quite some time so discontinued the medication.  Now, for about two weeks, she has been experiencing severe reflux and burning in her esophagus along with nausea and vomiting.  She wanted to restart her Reglan, but her PCP requested to she wait until she was seen here in our office.  She is taking protonix 40 mg BID. Also has some epigastric abdominal pain as well. Last EGD was in 02/2009 at which time she had an esophageal stricture that was dilated, but was otherwise unremarkable.  CBC, CMP, and thyroid studies were normal on 5/7.  Also states that she has been having diarrhea for the past several days as well.  About 5 stools a day.  No blood.  Was on Augmentin within the last couple of months.  No recent travel or sick contacts.   Past Medical History  Diagnosis Date  . Esophageal stricture   . Gastroparesis   . Diabetes mellitus   . Hyperlipidemia   . Osteoporosis   . Anxiety   . GERD (gastroesophageal reflux disease)   . Helicobacter pylori infection   . Vitamin D deficiency   . Hypertension    Past Surgical History  Procedure Laterality Date  . Vaginal hysterectomy      reports that she has been smoking Cigarettes.  She has been smoking about 0.00 packs per day. She has never used smokeless tobacco. She reports that she does not drink alcohol or use illicit drugs. family history includes Diabetes in her father. Allergies  Allergen Reactions  . Atorvastatin   . Clindamycin   . Sulfonamide Derivatives       Outpatient Encounter Prescriptions as of 10/22/2012  Medication Sig Dispense Refill  . AMBULATORY NON FORMULARY MEDICATION NOVOLOG INSULIN PUMP USES AS DIRECTED       . desvenlafaxine (PRISTIQ) 100 MG 24 hr tablet Take 100 mg by mouth daily.      Marland Kitchen gabapentin (NEURONTIN) 300 MG capsule One capsule by mouth once daily      . losartan (COZAAR) 25 MG tablet One by mouth once daily      . metoprolol succinate (TOPROL-XL) 50 MG 24 hr tablet One tablet by mouth once daily      . SYNTHROID 88 MCG tablet One tablet by mouth once daily      . Vitamin D, Ergocalciferol, (DRISDOL) 50000 UNITS CAPS Take 50,000 Units by mouth every 7 (seven) days.      . [DISCONTINUED] pantoprazole (PROTONIX) 40 MG tablet One by mouth twice daily      . dexlansoprazole (DEXILANT) 60 MG capsule Take 1 capsule (60 mg total) by mouth daily.  30 capsule  11  . metoCLOPramide (REGLAN) 10 MG tablet Take one tablet by mouth before meals and at bedtime  120 tablet  5   No facility-administered encounter medications on file as of 10/22/2012.     REVIEW OF SYSTEMS  : All other systems reviewed and negative except where noted in the History of Present Illness.   PHYSICAL EXAM: BP 108/70  Pulse 100  Ht 4\' 11"  (1.499 m)  Wt 136 lb (61.689 kg)  BMI 27.45 kg/m2 General: Well developed white female in no acute  distress Head: Normocephalic and atraumatic Eyes:  sclerae anicteric,conjunctive pink. Ears: Normal auditory acuity Lungs: Clear throughout to auscultation Heart: Regular rate and rhythm Abdomen: Soft, non-distended. No masses or hepatomegaly noted. Normal bowel sounds.  Mild epigastric TTP without R/R/G. Musculoskeletal: Symmetrical with no gross deformities  Skin: No lesions on visible extremities Extremities: No edema  Neurological: Alert oriented x 4, grossly nonfocal Psychological:  Alert and cooperative. Normal mood and affect  ASSESSMENT AND PLAN: -Nausea and vomiting, likely secondary to GERD and gastroparesis. -Diarrhea, ? Gastroenteritis.  Had a course of Augmentin within the past couple of months.  *Will restart her Reglan 10 mg ACHS.  I spoke with her about her  about switching to Domperidone, but she said that she would just like to restart the Reglan for now.  She denies any side effects from the medication. *She is requesting that we check her for H pylori (I cannot find biopsies from her EGD in 2010), so we will check H pylori Ag. *She will discontinue her protonix and she will try Dexilant 60 mg instead.  She will take BID for 2 weeks, then decrease to once daily.   *Check stool for Cdiff.   **Of note, after patient left the office I did find the results of CLOTEST from 2010 that was negative.

## 2012-10-28 ENCOUNTER — Other Ambulatory Visit: Payer: BC Managed Care – PPO

## 2012-10-28 DIAGNOSIS — R112 Nausea with vomiting, unspecified: Secondary | ICD-10-CM

## 2012-10-28 DIAGNOSIS — R197 Diarrhea, unspecified: Secondary | ICD-10-CM

## 2012-10-28 DIAGNOSIS — K3184 Gastroparesis: Secondary | ICD-10-CM

## 2012-10-29 LAB — HELICOBACTER PYLORI  SPECIAL ANTIGEN

## 2012-10-29 LAB — CLOSTRIDIUM DIFFICILE BY PCR: Toxigenic C. Difficile by PCR: NOT DETECTED

## 2012-10-30 ENCOUNTER — Encounter: Payer: Self-pay | Admitting: Gastroenterology

## 2012-10-31 ENCOUNTER — Encounter: Payer: Self-pay | Admitting: Gastroenterology

## 2012-11-07 ENCOUNTER — Ambulatory Visit (INDEPENDENT_AMBULATORY_CARE_PROVIDER_SITE_OTHER): Payer: BC Managed Care – PPO | Admitting: Internal Medicine

## 2012-11-07 ENCOUNTER — Encounter: Payer: Self-pay | Admitting: Internal Medicine

## 2012-11-07 VITALS — BP 108/60 | HR 86 | Ht 59.5 in | Wt 136.2 lb

## 2012-11-07 DIAGNOSIS — E119 Type 2 diabetes mellitus without complications: Secondary | ICD-10-CM

## 2012-11-07 DIAGNOSIS — R197 Diarrhea, unspecified: Secondary | ICD-10-CM

## 2012-11-07 DIAGNOSIS — K219 Gastro-esophageal reflux disease without esophagitis: Secondary | ICD-10-CM

## 2012-11-07 DIAGNOSIS — R112 Nausea with vomiting, unspecified: Secondary | ICD-10-CM

## 2012-11-07 MED ORDER — METRONIDAZOLE 250 MG PO TABS: 250.0000 mg | ORAL_TABLET | Freq: Four times a day (QID) | ORAL | Status: DC

## 2012-11-07 MED ORDER — ONDANSETRON HCL 4 MG PO TABS: 4.0000 mg | ORAL_TABLET | ORAL | Status: DC | PRN

## 2012-11-07 NOTE — Progress Notes (Signed)
HISTORY OF PRESENT ILLNESS:  Ashley [name] is a 47 y.o. female with long-standing insulin requiring diabetes mellitus, hyperlipidemia, osteoporosis, GERD, anxiety, and hypertension. She presents today regarding problems with chronic nausea, intermittent vomiting, and intermittent diarrhea. She does have a history of diabetic gastroparesis. Evaluation in 2010 including EGD and gastric emptying scan. EGD revealed esophageal stricture which was dilated. Otherwise negative endoscopy. Gastric emptying scan showed 57% retention at 2 hours. She was treated medically and improved. She now states that he aforementioned symptoms have been present about 3 months. She reports chronic, near daily nausea. Regurgitation several times per day. Frank vomiting 2-3 times per week with recently ingested food contents. Abdominal bloating but no pain. In terms of diarrhea, 4-5 times per week. At other times, soft stools. She was seen by the GI physician assistant 16 days ago. She was given Dexilant samples which she took twice daily for one week. This helped burning. No impact on other symptoms. Recent blood work including thyroid studies have been unremarkable. She does report poor blood sugar control. Many times blood sugars in the 500 range. Her last hemoglobin A1c was 8.2. She smokes.  REVIEW OF SYSTEMS:  All non-GI ROS negative except for sinus and allergy, anxiety, depression, fatigue, increased thirst  Past Medical History  Diagnosis Date  . Esophageal stricture   . Gastroparesis   . Diabetes mellitus   . Hyperlipidemia   . Osteoporosis   . Anxiety   . GERD (gastroesophageal reflux disease)   . Helicobacter pylori infection   . Vitamin D deficiency   . Hypertension     Past Surgical History  Procedure Laterality Date  . Vaginal hysterectomy      Social History Ashley [name]  reports that she has been smoking Cigarettes.  She has been smoking about 0.00 packs per day. She has never used smokeless  tobacco. She reports that she does not drink alcohol or use illicit drugs.  family history includes Diabetes in her father.  Allergies  Allergen Reactions  . Atorvastatin   . Clindamycin   . Sulfonamide Derivatives        PHYSICAL EXAMINATION: Vital signs: BP 108/60  Pulse 86  Ht 4' 11.5" (1.511 m)  Wt 136 lb 4 oz (61.803 kg)  BMI 27.07 kg/m2 General: Well-developed, well-nourished, no acute distress HEENT: Sclerae are anicteric, conjunctiva pink. Oral mucosa intact Lungs: Clear Heart: Regular Abdomen: soft, obese, nontender, nondistended, no obvious ascites, no peritoneal signs, normal bowel sounds. No organomegaly. No succussion splash Extremities: No edema Psychiatric: alert and oriented x3. Cooperative   ASSESSMENT:  #1. Chronic nausea, regurgitation, and intermittent vomiting. Rule out diabetic gastroparesis. Rule out worsening GERD. Rule out Candida esophagitis #2. Chronic diarrhea. Negative stool studies. Rule out bacterial overgrowth #3. Multiple general medical problems including insulin requiring diabetes mellitus. Diabetes mellitus under poor control.  PLAN:  #1. Continue Dexilant 60 mg daily #2. Prescribe Zofran 4 mg every 4 hours when necessary nausea #3. Continue Reglan 4 times a day for now #4. Prescribe metronidazole 250 mg 4 times a day x10 days for possible bacterial overgrowth #5. Upper endoscopy to assess for other causes for her symptoms such as esophageal candidiasis.The nature of the procedure, as well as the risks, benefits, and alternatives were carefully and thoroughly reviewed with the patient. Ample time for discussion and questions allowed. The patient understood, was satisfied, and agreed to proceed. She will consult with Dr. Felipa Eth regarding adjustments in her insulin pump rate to avoid hypoglycemia as she  will be n.p.o. for several hours prior to the procedure. #6. Importance of good blood sugar control to help with GI symptoms cannot be over  stressed. She will work with Dr. Felipa Eth #7. Stop smoking

## 2012-11-07 NOTE — Patient Instructions (Addendum)
You have been scheduled for an endoscopy with propofol. Please follow written instructions given to you at your visit today.  If you use inhalers (even only as needed), please bring them with you on the day of your procedure.  Per Dr. Marina Goodell please discuss adjusting your insulin pump with Dr. Felipa Eth  Work on improving control of your diabetes with Dr. Felipa Eth  Your physician has requested that you go to www.startemmi.com and enter the access code given to you at your visit today. This web site gives a general overview about your procedure. However, you should still follow specific instructions given to you by our office regarding your preparation for the procedure.  We have sent the following medications to your pharmacy for you to pick up at your convenience:  Metronidazole and Zofran

## 2012-11-14 ENCOUNTER — Ambulatory Visit (AMBULATORY_SURGERY_CENTER): Payer: BC Managed Care – PPO | Admitting: Internal Medicine

## 2012-11-14 ENCOUNTER — Encounter: Payer: Self-pay | Admitting: Internal Medicine

## 2012-11-14 VITALS — BP 109/71 | HR 73 | Temp 98.3°F | Resp 15 | Ht 59.5 in | Wt 136.0 lb

## 2012-11-14 DIAGNOSIS — R112 Nausea with vomiting, unspecified: Secondary | ICD-10-CM

## 2012-11-14 DIAGNOSIS — D130 Benign neoplasm of esophagus: Secondary | ICD-10-CM

## 2012-11-14 DIAGNOSIS — K3184 Gastroparesis: Secondary | ICD-10-CM

## 2012-11-14 DIAGNOSIS — R197 Diarrhea, unspecified: Secondary | ICD-10-CM

## 2012-11-14 DIAGNOSIS — K219 Gastro-esophageal reflux disease without esophagitis: Secondary | ICD-10-CM

## 2012-11-14 MED ORDER — SODIUM CHLORIDE 0.9 % IV SOLN: 500.0000 mL | INTRAVENOUS | Status: DC

## 2012-11-14 NOTE — Progress Notes (Signed)
Report to pacu rn, vss, bbs=clear 

## 2012-11-14 NOTE — Progress Notes (Signed)
NO EGG OR SOY ALLERGY. EWM 

## 2012-11-14 NOTE — Progress Notes (Signed)
Patient did not experience any of the following events: a burn prior to discharge; a fall within the facility; wrong site/side/patient/procedure/implant event; or a hospital transfer or hospital admission upon discharge from the facility. (G8907) Patient did not have preoperative order for IV antibiotic SSI prophylaxis. (G8918)  

## 2012-11-14 NOTE — Patient Instructions (Addendum)

## 2012-11-14 NOTE — Progress Notes (Signed)
Called to room to assist during endoscopic procedure.  Patient ID and intended procedure confirmed with present staff. Received instructions for my participation in the procedure from the performing physician.  

## 2012-11-14 NOTE — Op Note (Signed)
[facility] [address] [place], [address]   ENDOSCOPY PROCEDURE REPORT  PATIENT: Ashley, [name]  MR#: [id number] BIRTHDATE: [birth year] , 47  yrs. old GENDER: Female ENDOSCOPIST: Roxy Cedar, MD REFERRED BY:  Chilton Greathouse, M.D. PROCEDURE DATE:  11/14/2012 PROCEDURE:  EGD w/ biopsy ASA CLASS:     Class III INDICATIONS:  Nausea.   Vomiting.  Recently seen in office. Doing somewhat better with medication adjustments MEDICATIONS: MAC sedation, administered by CRNA and propofol (Diprivan) 120mg  IV TOPICAL ANESTHETIC: none  DESCRIPTION OF PROCEDURE: After the risks benefits and alternatives of the procedure were thoroughly explained, informed consent was obtained.  The LB [id number] endoscope was introduced through the mouth and advanced to the second portion of the duodenum. Without limitations.  The instrument was slowly withdrawn as the mucosa was fully examined.      EXAM: 3mm esophageal nodule (at 20cm) c/w benign squamous papilloma. Removed with cold biopsy forceps.  Large caliber Distal esophageal ring.  Otherwise normal esophagus, stomach and duodenum. Retroflexed views revealed no abnormalities.     The scope was then withdrawn from the patient and the procedure completed.  COMPLICATIONS: There were no complications. ENDOSCOPIC IMPRESSION: 1. 3mm esophageal nodule (at 20cm) c/w benign squamous papilloma. Removed with cold biopsy forceps.  Large caliber 2. Distal esophageal ring. 3. Otherwise normal exam  RECOMMENDATIONS: 1. Continue current medications 2. Work on good diabetes control 3. Office follow up in 4-6 weeks  REPEAT EXAM:  eSigned:  Roxy Cedar, MD 11/14/2012 9:05 AM   HQ:[id number], MD and The Patient

## 2012-11-17 ENCOUNTER — Telehealth: Payer: Self-pay | Admitting: *Deleted

## 2012-11-17 NOTE — Telephone Encounter (Signed)
  Follow up Call-  Call back number 11/14/2012  Post procedure Call Back phone  # [phone]  Permission to leave phone message Yes     Patient questions:  Do you have a fever, pain , or abdominal swelling? no Pain Score  0 *  Have you tolerated food without any problems? yes  Have you been able to return to your normal activities? yes  Do you have any questions about your discharge instructions: Diet   no Medications  no Follow up visit  no  Do you have questions or concerns about your Care? no  Actions: * If pain score is 4 or above: No action needed, pain <4.

## 2012-11-18 ENCOUNTER — Encounter: Payer: Self-pay | Admitting: Internal Medicine

## 2012-12-15 ENCOUNTER — Encounter: Payer: Self-pay | Admitting: Internal Medicine

## 2012-12-15 ENCOUNTER — Ambulatory Visit (INDEPENDENT_AMBULATORY_CARE_PROVIDER_SITE_OTHER): Payer: BC Managed Care – PPO | Admitting: Internal Medicine

## 2012-12-15 VITALS — BP 98/70 | HR 76 | Ht 59.5 in | Wt 138.8 lb

## 2012-12-15 DIAGNOSIS — K219 Gastro-esophageal reflux disease without esophagitis: Secondary | ICD-10-CM

## 2012-12-15 DIAGNOSIS — R112 Nausea with vomiting, unspecified: Secondary | ICD-10-CM

## 2012-12-15 DIAGNOSIS — K3184 Gastroparesis: Secondary | ICD-10-CM

## 2012-12-15 DIAGNOSIS — R197 Diarrhea, unspecified: Secondary | ICD-10-CM

## 2012-12-15 NOTE — Patient Instructions (Addendum)
Please follow up with Dr. Perry as needed 

## 2012-12-15 NOTE — Progress Notes (Signed)
HISTORY OF PRESENT ILLNESS:  Ashley [name] is a 47 y.o. female with long-standing insulin requiring diabetes mellitus, hyperlipidemia, osteoporosis, anxiety, hypertension, GERD, and history of diabetic gastroparesis. She was seen in followup, in the office, 11/07/2012 after a recent visit with our extender. The chief complaint at that time was chronic nausea, regurgitation, intermittent vomiting, and chronic diarrhea. She was continued on Dexilant and prescribe Zofran, Reglan, and metronidazole. Importance of good blood sugar control was emphasized as well as discontinuing smoking. Upper endoscopy was performed 11/14/2012. This revealed a diminutive proximal esophageal nodule which was removed with cold biopsy forceps and found to be a benign squamous papilloma. There was an incidental distal esophageal ring. Examination was otherwise normal. She continued on therapies and follows up at this time. She does report that she is doing much better. No reflux symptoms. No vomiting. Still with some nausea, though not taking antiemetics. She continues on metoclopramide. She reports that her blood sugar control and is attempting to stop smoking. As well, her diarrhea has improved.  REVIEW OF SYSTEMS:  All non-GI ROS negative upon review  Past Medical History  Diagnosis Date  . Esophageal stricture   . Gastroparesis   . Diabetes mellitus   . Hyperlipidemia   . Osteoporosis   . Anxiety   . GERD (gastroesophageal reflux disease)   . Helicobacter pylori infection   . Vitamin D deficiency   . Hypertension     Past Surgical History  Procedure Laterality Date  . Vaginal hysterectomy    . Upper gastrointestinal endoscopy    . Breast lumpectomy      LEFT-BENIGN    Social History Ashley [name]  reports that she has been smoking Cigarettes.  She has been smoking about 0.00 packs per day. She has never used smokeless tobacco. She reports that she does not drink alcohol or use illicit  drugs.  family history includes Diabetes in her father.  There is no history of Colon cancer, and Esophageal cancer, and Rectal cancer, and Stomach cancer, .  Allergies  Allergen Reactions  . Clindamycin Hives  . Sulfonamide Derivatives Hives  . Atorvastatin Other (See Comments)    MUSCLE ACHES       PHYSICAL EXAMINATION: Vital signs: BP 98/70  Pulse 76  Ht 4' 11.5" (1.511 m)  Wt 138 lb 12.8 oz (62.959 kg)  BMI 27.58 kg/m2 General: Well-developed, well-nourished, no acute distress HEENT: Sclerae are anicteric, conjunctiva pink. Oral mucosa intact Lungs: Clear Heart: Regular Abdomen: soft, nontender, nondistended, no obvious ascites, no peritoneal signs, normal bowel sounds. No organomegaly. Extremities: No edema Psychiatric: alert and oriented x3. Cooperative   ASSESSMENT:  #1. GERD. Symptoms improved on Dexilant. #2. History of gastroparesis. Improved on metoclopramide #3. Diarrhea. Improve after empiric trial of metronidazole, for possible bacterial overgrowth. #4. Multiple medical problems including long-standing insulin requiring diabetes mellitus  PLAN:  #1. Reflux precautions #2. Continue Dexilant #3. Advised to wean off metoclopramide over the next few weeks #4. Blood sugar control and discontinuation of smoking again emphasized. #5. Return to the care of Dr. Felipa Eth. GI followup as needed

## 2013-01-12 ENCOUNTER — Ambulatory Visit: Payer: Self-pay

## 2013-01-19 ENCOUNTER — Ambulatory Visit: Payer: Self-pay

## 2013-02-04 ENCOUNTER — Ambulatory Visit: Payer: Self-pay

## 2013-08-31 ENCOUNTER — Other Ambulatory Visit: Payer: Self-pay | Admitting: Gastroenterology

## 2013-09-01 ENCOUNTER — Other Ambulatory Visit: Payer: Self-pay | Admitting: Otolaryngology

## 2013-09-03 ENCOUNTER — Encounter (HOSPITAL_BASED_OUTPATIENT_CLINIC_OR_DEPARTMENT_OTHER)
Admission: RE | Admit: 2013-09-03 | Discharge: 2013-09-03 | Disposition: A | Discharge status: Home / Self Care | Payer: BC Managed Care – PPO | Source: Ambulatory Visit | Attending: Otolaryngology | Admitting: Otolaryngology

## 2013-09-03 ENCOUNTER — Encounter (HOSPITAL_BASED_OUTPATIENT_CLINIC_OR_DEPARTMENT_OTHER): Payer: Self-pay | Admitting: *Deleted

## 2013-09-03 ENCOUNTER — Other Ambulatory Visit: Payer: Self-pay

## 2013-09-03 DIAGNOSIS — Z01812 Encounter for preprocedural laboratory examination: Secondary | ICD-10-CM | POA: Insufficient documentation

## 2013-09-03 DIAGNOSIS — Z01810 Encounter for preprocedural cardiovascular examination: Secondary | ICD-10-CM | POA: Insufficient documentation

## 2013-09-03 LAB — BASIC METABOLIC PANEL
BUN: 7 mg/dL (ref 6–23)
CALCIUM: 9.6 mg/dL (ref 8.4–10.5)
CO2: 25 mEq/L (ref 19–32)
CREATININE: 0.57 mg/dL (ref 0.50–1.10)
Chloride: 100 mEq/L (ref 96–112)
GFR calc Af Amer: >90 mL/min (ref >90)
GFR calc non Af Amer: >90 mL/min (ref >90)
GLUCOSE: 195 mg/dL — AB (ref 70–99)
Potassium: 3.7 mEq/L (ref 3.7–5.3)
SODIUM: 140 meq/L (ref 137–147)

## 2013-09-03 NOTE — Progress Notes (Signed)
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 Will come in for bmet-ekg

## 2013-09-04 ENCOUNTER — Encounter (HOSPITAL_BASED_OUTPATIENT_CLINIC_OR_DEPARTMENT_OTHER): Payer: Self-pay | Admitting: Anesthesiology

## 2013-09-07 ENCOUNTER — Ambulatory Visit (HOSPITAL_BASED_OUTPATIENT_CLINIC_OR_DEPARTMENT_OTHER): Payer: BC Managed Care – PPO | Admitting: Anesthesiology

## 2013-09-07 ENCOUNTER — Encounter (HOSPITAL_BASED_OUTPATIENT_CLINIC_OR_DEPARTMENT_OTHER)
Admission: RE | Disposition: A | Discharge status: Home / Self Care | Payer: Self-pay | Source: Ambulatory Visit | Attending: Otolaryngology

## 2013-09-07 ENCOUNTER — Ambulatory Visit (HOSPITAL_BASED_OUTPATIENT_CLINIC_OR_DEPARTMENT_OTHER)
Admission: RE | Admit: 2013-09-07 | Discharge: 2013-09-07 | Disposition: A | Discharge status: Home / Self Care | Payer: BC Managed Care – PPO | Source: Ambulatory Visit | Attending: Otolaryngology | Admitting: Otolaryngology

## 2013-09-07 ENCOUNTER — Encounter (HOSPITAL_BASED_OUTPATIENT_CLINIC_OR_DEPARTMENT_OTHER): Payer: BC Managed Care – PPO | Admitting: Anesthesiology

## 2013-09-07 ENCOUNTER — Encounter (HOSPITAL_BASED_OUTPATIENT_CLINIC_OR_DEPARTMENT_OTHER): Payer: Self-pay

## 2013-09-07 DIAGNOSIS — Z794 Long term (current) use of insulin: Secondary | ICD-10-CM | POA: Insufficient documentation

## 2013-09-07 DIAGNOSIS — K219 Gastro-esophageal reflux disease without esophagitis: Secondary | ICD-10-CM | POA: Insufficient documentation

## 2013-09-07 DIAGNOSIS — J3489 Other specified disorders of nose and nasal sinuses: Secondary | ICD-10-CM | POA: Insufficient documentation

## 2013-09-07 DIAGNOSIS — J342 Deviated nasal septum: Secondary | ICD-10-CM | POA: Insufficient documentation

## 2013-09-07 DIAGNOSIS — J343 Hypertrophy of nasal turbinates: Secondary | ICD-10-CM | POA: Insufficient documentation

## 2013-09-07 DIAGNOSIS — I10 Essential (primary) hypertension: Secondary | ICD-10-CM | POA: Insufficient documentation

## 2013-09-07 DIAGNOSIS — E119 Type 2 diabetes mellitus without complications: Secondary | ICD-10-CM | POA: Insufficient documentation

## 2013-09-07 DIAGNOSIS — F17200 Nicotine dependence, unspecified, uncomplicated: Secondary | ICD-10-CM | POA: Insufficient documentation

## 2013-09-07 DIAGNOSIS — Z9889 Other specified postprocedural states: Secondary | ICD-10-CM

## 2013-09-07 HISTORY — PX: NASAL SEPTOPLASTY W/ TURBINOPLASTY: SHX2070

## 2013-09-07 HISTORY — DX: Presence of insulin pump (external) (internal): Z96.41

## 2013-09-07 LAB — GLUCOSE, CAPILLARY
Glucose-Capillary: 134 mg/dL — ABNORMAL HIGH (ref 70–99)
Glucose-Capillary: 203 mg/dL — ABNORMAL HIGH (ref 70–99)

## 2013-09-07 LAB — POCT HEMOGLOBIN-HEMACUE: HEMOGLOBIN: 13.5 g/dL (ref 12.0–15.0)

## 2013-09-07 SURGERY — SEPTOPLASTY, NOSE, WITH NASAL TURBINATE REDUCTION
Anesthesia: General | Site: Nose | Laterality: Bilateral

## 2013-09-07 MED ORDER — OXYCODONE HCL 5 MG PO TABS: 5.0000 mg | ORAL_TABLET | Freq: Once | ORAL | Status: DC | PRN

## 2013-09-07 MED ORDER — MIDAZOLAM HCL 2 MG/2ML IJ SOLN
1.0000 mg | INTRAMUSCULAR | Status: DC | PRN
Start: 2013-09-07 — End: 2013-09-07

## 2013-09-07 MED ORDER — HYDROCODONE-ACETAMINOPHEN 5-325 MG PO TABS: 1.0000 | ORAL_TABLET | ORAL | Status: DC | PRN

## 2013-09-07 MED ORDER — FENTANYL CITRATE 0.05 MG/ML IJ SOLN
INTRAMUSCULAR | Status: DC | PRN
  Administered 2013-09-07: 100 ug via INTRAVENOUS

## 2013-09-07 MED ORDER — DEXAMETHASONE SODIUM PHOSPHATE 4 MG/ML IJ SOLN
INTRAMUSCULAR | Status: DC | PRN
  Administered 2013-09-07: 10 mg via INTRAVENOUS

## 2013-09-07 MED ORDER — HYDROMORPHONE HCL PF 1 MG/ML IJ SOLN
0.2500 mg | INTRAMUSCULAR | Status: DC | PRN
  Administered 2013-09-07: 0.5 mg via INTRAVENOUS
  Administered 2013-09-07: 0.25 mg via INTRAVENOUS

## 2013-09-07 MED ORDER — LACTATED RINGERS IV SOLN
INTRAVENOUS | Status: DC
  Administered 2013-09-07 (×2): via INTRAVENOUS

## 2013-09-07 MED ORDER — FENTANYL CITRATE 0.05 MG/ML IJ SOLN
INTRAMUSCULAR | Status: AC
  Filled 2013-09-07: qty 4

## 2013-09-07 MED ORDER — FENTANYL CITRATE 0.05 MG/ML IJ SOLN: 50.0000 ug | INTRAMUSCULAR | Status: DC | PRN

## 2013-09-07 MED ORDER — LIDOCAINE-EPINEPHRINE 1 %-1:100000 IJ SOLN
INTRAMUSCULAR | Status: AC
  Filled 2013-09-07: qty 1

## 2013-09-07 MED ORDER — ONDANSETRON HCL 4 MG/2ML IJ SOLN
INTRAMUSCULAR | Status: DC | PRN
  Administered 2013-09-07: 4 mg via INTRAVENOUS

## 2013-09-07 MED ORDER — SUCCINYLCHOLINE CHLORIDE 20 MG/ML IJ SOLN
INTRAMUSCULAR | Status: DC | PRN
  Administered 2013-09-07: 50 mg via INTRAVENOUS

## 2013-09-07 MED ORDER — HYDROMORPHONE HCL PF 1 MG/ML IJ SOLN
INTRAMUSCULAR | Status: AC
  Filled 2013-09-07: qty 1

## 2013-09-07 MED ORDER — MIDAZOLAM HCL 2 MG/2ML IJ SOLN
INTRAMUSCULAR | Status: AC
  Filled 2013-09-07: qty 2

## 2013-09-07 MED ORDER — MIDAZOLAM HCL 5 MG/5ML IJ SOLN
INTRAMUSCULAR | Status: DC | PRN
  Administered 2013-09-07: 2 mg via INTRAVENOUS

## 2013-09-07 MED ORDER — MUPIROCIN 2 % EX OINT
TOPICAL_OINTMENT | CUTANEOUS | Status: AC
  Filled 2013-09-07: qty 22

## 2013-09-07 MED ORDER — LIDOCAINE HCL (CARDIAC) 20 MG/ML IV SOLN
INTRAVENOUS | Status: DC | PRN
  Administered 2013-09-07: 40 mg via INTRAVENOUS

## 2013-09-07 MED ORDER — ONDANSETRON HCL 4 MG/2ML IJ SOLN: 4.0000 mg | Freq: Once | INTRAMUSCULAR | Status: DC | PRN

## 2013-09-07 MED ORDER — SCOPOLAMINE 1 MG/3DAYS TD PT72
MEDICATED_PATCH | TRANSDERMAL | Status: AC
  Filled 2013-09-07: qty 1

## 2013-09-07 MED ORDER — OXYCODONE HCL 5 MG/5ML PO SOLN: 5.0000 mg | Freq: Once | ORAL | Status: DC | PRN

## 2013-09-07 MED ORDER — OXYMETAZOLINE HCL 0.05 % NA SOLN
NASAL | Status: DC | PRN
  Administered 2013-09-07: 1 via NASAL

## 2013-09-07 MED ORDER — LIDOCAINE-EPINEPHRINE 1 %-1:100000 IJ SOLN
INTRAMUSCULAR | Status: DC | PRN
  Administered 2013-09-07: 2.5 mL

## 2013-09-07 MED ORDER — AMOXICILLIN 875 MG PO TABS: 875.0000 mg | ORAL_TABLET | Freq: Two times a day (BID) | ORAL | Status: DC

## 2013-09-07 MED ORDER — PROPOFOL 10 MG/ML IV BOLUS
INTRAVENOUS | Status: DC | PRN
  Administered 2013-09-07: 100 mg via INTRAVENOUS
  Administered 2013-09-07: 200 mg via INTRAVENOUS

## 2013-09-07 MED ORDER — MIDAZOLAM HCL 5 MG/5ML IJ SOLN: INTRAMUSCULAR | Status: DC | PRN

## 2013-09-07 MED ORDER — PHENYLEPHRINE HCL 10 MG/ML IJ SOLN
INTRAMUSCULAR | Status: DC | PRN
  Administered 2013-09-07: 100 ug via INTRAVENOUS

## 2013-09-07 SURGICAL SUPPLY — 32 items
ATTRACTOMAT 16X20 MAGNETIC DRP (DRAPES) IMPLANT
CANISTER SUCT 1200ML W/VALVE (MISCELLANEOUS) ×2 IMPLANT
COAGULATOR SUCT 8FR VV (MISCELLANEOUS) ×2 IMPLANT
DECANTER SPIKE VIAL GLASS SM (MISCELLANEOUS) IMPLANT
DRSG NASOPORE 8CM (GAUZE/BANDAGES/DRESSINGS) IMPLANT
DRSG TELFA 3X8 NADH (GAUZE/BANDAGES/DRESSINGS) IMPLANT
ELECT REM PT RETURN 9FT ADLT (ELECTROSURGICAL) ×2
ELECTRODE REM PT RTRN 9FT ADLT (ELECTROSURGICAL) ×1 IMPLANT
GLOVE BIO SURGEON STRL SZ7.5 (GLOVE) ×2 IMPLANT
GLOVE BIOGEL PI IND STRL 6.5 (GLOVE) ×1 IMPLANT
GLOVE BIOGEL PI INDICATOR 6.5 (GLOVE) ×1
GLOVE SURG SS PI 7.0 STRL IVOR (GLOVE) ×2 IMPLANT
GOWN STRL REUS W/ TWL LRG LVL3 (GOWN DISPOSABLE) ×3 IMPLANT
GOWN STRL REUS W/TWL LRG LVL3 (GOWN DISPOSABLE) ×3
NEEDLE HYPO 25X1 1.5 SAFETY (NEEDLE) ×2 IMPLANT
NS IRRIG 1000ML POUR BTL (IV SOLUTION) ×2 IMPLANT
PACK BASIN DAY SURGERY FS (CUSTOM PROCEDURE TRAY) ×2 IMPLANT
PACK ENT DAY SURGERY (CUSTOM PROCEDURE TRAY) ×2 IMPLANT
SLEEVE SCD COMPRESS KNEE MED (MISCELLANEOUS) ×2 IMPLANT
SOLUTION BUTLER CLEAR DIP (MISCELLANEOUS) ×2 IMPLANT
SPLINT NASAL DOYLE BI-VL (GAUZE/BANDAGES/DRESSINGS) ×2 IMPLANT
SPONGE GAUZE 2X2 8PLY STRL LF (GAUZE/BANDAGES/DRESSINGS) ×2 IMPLANT
SPONGE NEURO XRAY DETECT 1X3 (DISPOSABLE) ×2 IMPLANT
SUT CHROMIC 4 0 P 3 18 (SUTURE) ×2 IMPLANT
SUT PLAIN 4 0 ~~LOC~~ 1 (SUTURE) ×2 IMPLANT
SUT PROLENE 3 0 PS 2 (SUTURE) ×2 IMPLANT
SUT VIC AB 4-0 P-3 18XBRD (SUTURE) IMPLANT
SUT VIC AB 4-0 P3 18 (SUTURE)
TOWEL OR 17X24 6PK STRL BLUE (TOWEL DISPOSABLE) ×2 IMPLANT
TUBE SALEM SUMP 12R W/ARV (TUBING) IMPLANT
TUBE SALEM SUMP 16 FR W/ARV (TUBING) ×2 IMPLANT
YANKAUER SUCT BULB TIP NO VENT (SUCTIONS) ×2 IMPLANT

## 2013-09-07 NOTE — Anesthesia Postprocedure Evaluation (Signed)
  Anesthesia Post-op Note  Patient: Ashley [name]  Procedure(s) Performed: Procedure(s): SEPTOPLASTY, BILATERAL TURBINATE RESECTION  (Bilateral)  Patient Location: PACU  Anesthesia Type:General  Level of Consciousness: awake, alert  and oriented  Airway and Oxygen Therapy: Patient Spontanous Breathing  Post-op Pain: none  Post-op Assessment: Post-op Vital signs reviewed  Post-op Vital Signs: Reviewed  Complications: No apparent anesthesia complications

## 2013-09-07 NOTE — Anesthesia Procedure Notes (Signed)
Procedure Name: Intubation Date/Time: 09/07/2013 10:35 AM Performed by: Marrianne Mood Pre-anesthesia Checklist: Patient identified, Emergency Drugs available, Suction available, Patient being monitored and Timeout performed Patient Re-evaluated:Patient Re-evaluated prior to inductionOxygen Delivery Method: Circle System Utilized Preoxygenation: Pre-oxygenation with 100% oxygen Intubation Type: IV induction Ventilation: Mask ventilation without difficulty Laryngoscope Size: Miller and 3 Grade View: Grade II Tube type: Oral Tube size: 7.0 mm Number of attempts: 1 Airway Equipment and Method: stylet and oral airway Placement Confirmation: ETT inserted through vocal cords under direct vision,  positive ETCO2 and breath sounds checked- equal and bilateral Secured at: 21 cm Tube secured with: Tape Dental Injury: Teeth and Oropharynx as per pre-operative assessment

## 2013-09-07 NOTE — H&P (Signed)
H&P Update  Pt's original H&P dated 08/26/13 reviewed and placed in chart (to be scanned).  I personally examined the patient today.  No change in health. Proceed with septoplasty and bilateral inferior turbinate reduction.

## 2013-09-07 NOTE — Discharge Instructions (Addendum)

## 2013-09-07 NOTE — Op Note (Signed)
DATE OF PROCEDURE: 09/07/2013  OPERATIVE REPORT   SURGEON: Leta Baptist, MD   PREOPERATIVE DIAGNOSES:  1. Nasal septal deviation.  2. Bilateral inferior turbinate hypertrophy.  3. Chronic nasal obstruction.  POSTOPERATIVE DIAGNOSES:  1. Nasal septal deviation.  2. Bilateral inferior turbinate hypertrophy.  3. Chronic nasal obstruction.  PROCEDURE PERFORMED:  1. Septoplasty.  2. Bilateral partial inferior turbinate resection.   ANESTHESIA: General endotracheal tube anesthesia.   COMPLICATIONS: None.   ESTIMATED BLOOD LOSS: Less than 50 mL.   INDICATION FOR PROCEDURE: Ashley [name] is a 48 y.o. female with a history of chronic nasal obstruction. The patient was  treated with antihistamine, decongestant, and steroid nasal spray. However, the patient continues to be symptomatic. On examination, the patient was noted to have bilateral severe inferior turbinate hypertrophy and significant nasal septal deviation, causing significant nasal obstruction. Based on the above findings, the decision was made for the patient to undergo the above-stated procedure. The risks, benefits, alternatives, and details of the procedure were discussed with the patient. Questions were invited and answered. Informed consent was obtained.   DESCRIPTION OF PROCEDURE: The patient was taken to the operating room and placed supine on the operating table. General endotracheal tube anesthesia was administered by the anesthesiologist. The patient was positioned, and prepped and draped in the standard fashion for nasal surgery. Pledgets soaked with Afrin were placed in both nasal cavities for decongestion. The pledgets were subsequently removed. The above mentioned severe septal deviation was again noted. 1% lidocaine with 1:100,000 epinephrine was injected onto the nasal septum bilaterally. A hemitransfixion incision was made on the left side. The mucosal flap was carefully elevated on the left side. A cartilaginous incision  was made 1 cm superior to the caudal margin of the nasal septum. Mucosal flap was also elevated on the right side in the similar fashion. It should be noted that due to the severe septal deviation, the deviated portion of the cartilaginous and bony septum had to be removed in piecemeal fashion. Once the deviated portions were removed, a straight midline septum was achieved. The septum was then quilted with 4-0 plain gut sutures. The hemitransfixion incision was closed with interrupted 4-0 chromic sutures. Doyle splints were applied.   Prior to the [facility] splint application, the inferior one half of both hypertrophied inferior turbinate was crossclamped with a Kelly clamp. The inferior one half of each inferior turbinate was then resected with a pair of cross cutting scissors. Hemostasis was achieved with a suction cautery device.   The care of the patient was turned over to the anesthesiologist. The patient was awakened from anesthesia without difficulty. The patient was extubated and transferred to the recovery room in good condition.   OPERATIVE FINDINGS: Severe nasal septal deviation and bilateral inferior turbinate hypertrophy.   SPECIMEN: None.   FOLLOWUP CARE: The patient be discharged home once she is awake and alert. The patient will be placed on Vicodin 1-2 tablets p.o. q.6 hours p.r.n. pain, and amoxicillin 875 mg p.o. b.i.d. for 5 days. The patient will follow up in my office in approximately 1 week for splint removal.   Ashley Stambaugh Raynelle Bring, MD

## 2013-09-07 NOTE — Transfer of Care (Signed)
Immediate Anesthesia Transfer of Care Note  Patient: Ashley [name]  Procedure(s) Performed: Procedure(s): SEPTOPLASTY, BILATERAL TURBINATE RESECTION  (Bilateral)  Patient Location: PACU  Anesthesia Type:General  Level of Consciousness: awake, alert , oriented and patient cooperative  Airway & Oxygen Therapy: Patient Spontanous Breathing and Patient connected to face mask oxygen  Post-op Assessment: Report given to PACU RN and Post -op Vital signs reviewed and stable  Post vital signs: Reviewed and stable  Complications: No apparent anesthesia complications

## 2013-09-07 NOTE — Anesthesia Preprocedure Evaluation (Addendum)
Anesthesia Evaluation  Patient identified by MRN, date of birth, ID band Patient awake    Reviewed: Allergy & Precautions, H&P , NPO status , Patient's Chart, lab work & pertinent test results  History of Anesthesia Complications (+) PONV  Airway Mallampati: I TM Distance: >3 FB Neck ROM: Full    Dental  (+) Teeth Intact, Dental Advisory Given   Pulmonary Current Smoker,  breath sounds clear to auscultation        Cardiovascular hypertension, Pt. on medications and Pt. on home beta blockers Rhythm:Regular     Neuro/Psych    GI/Hepatic GERD-  Medicated and Controlled,  Endo/Other  diabetes, Well Controlled, Type 2, Insulin Dependent  Renal/GU      Musculoskeletal   Abdominal   Peds  Hematology   Anesthesia Other Findings   Reproductive/Obstetrics                          Anesthesia Physical Anesthesia Plan  ASA: III  Anesthesia Plan: General   Post-op Pain Management:    Induction: Intravenous  Airway Management Planned: Oral ETT  Additional Equipment:   Intra-op Plan:   Post-operative Plan: Extubation in OR  Informed Consent: I have reviewed the patients History and Physical, chart, labs and discussed the procedure including the risks, benefits and alternatives for the proposed anesthesia with the patient or authorized representative who has indicated his/her understanding and acceptance.   Dental advisory given  Plan Discussed with: CRNA, Anesthesiologist and Surgeon  Anesthesia Plan Comments:         Anesthesia Quick Evaluation

## 2013-09-08 ENCOUNTER — Encounter (HOSPITAL_BASED_OUTPATIENT_CLINIC_OR_DEPARTMENT_OTHER): Payer: Self-pay | Admitting: Otolaryngology

## 2013-10-09 ENCOUNTER — Other Ambulatory Visit: Payer: Self-pay | Admitting: Gastroenterology

## 2013-10-09 NOTE — Telephone Encounter (Signed)
Sent Dr. Henrene Pastor message regarding the refill of Reglan.  Will respond to patient when I hear from him.

## 2013-10-20 ENCOUNTER — Telehealth: Payer: Self-pay

## 2013-10-20 NOTE — Telephone Encounter (Signed)
Message copied by Audrea Muscat on Tue Oct 20, 2013  1:42 PM ------      Message from: Irene Shipper      Created: Fri Oct 09, 2013  1:48 PM       Do not refill. Covert this to phone note for the record. Thanks       ----- Message -----         From: Audrea Muscat, CMA         Sent: 10/09/2013   1:39 PM           To: Irene Shipper, MD            Patient requesting refill of Reglan.  Patient last seen for reflux and gastroparesis on 12/22/2012 at which time you advised her to wean off the Reglan over the upcoming weeks.  No showed next three appointments.  Please advise how you would like me to address this.       ------

## 2014-01-24 ENCOUNTER — Other Ambulatory Visit: Payer: Self-pay | Admitting: Internal Medicine

## 2014-05-14 ENCOUNTER — Telehealth: Payer: Self-pay

## 2014-05-14 MED ORDER — DEXLANSOPRAZOLE 60 MG PO CPDR: 1.0000 | DELAYED_RELEASE_CAPSULE | Freq: Every day | ORAL | Status: DC

## 2014-05-14 NOTE — Telephone Encounter (Signed)
Refilled Dexilant 

## 2014-08-18 ENCOUNTER — Other Ambulatory Visit: Payer: Self-pay | Admitting: Obstetrics and Gynecology

## 2014-08-19 LAB — CYTOLOGY - PAP

## 2014-09-30 ENCOUNTER — Encounter: Payer: Self-pay | Admitting: Internal Medicine

## 2014-09-30 ENCOUNTER — Ambulatory Visit (INDEPENDENT_AMBULATORY_CARE_PROVIDER_SITE_OTHER): Payer: BLUE CROSS/BLUE SHIELD | Admitting: Internal Medicine

## 2014-09-30 VITALS — BP 116/70 | HR 88 | Ht 59.0 in | Wt 137.5 lb

## 2014-09-30 DIAGNOSIS — K219 Gastro-esophageal reflux disease without esophagitis: Secondary | ICD-10-CM | POA: Diagnosis not present

## 2014-09-30 DIAGNOSIS — K3184 Gastroparesis: Secondary | ICD-10-CM

## 2014-09-30 DIAGNOSIS — R197 Diarrhea, unspecified: Secondary | ICD-10-CM

## 2014-09-30 DIAGNOSIS — R110 Nausea: Secondary | ICD-10-CM | POA: Diagnosis not present

## 2014-09-30 DIAGNOSIS — R112 Nausea with vomiting, unspecified: Secondary | ICD-10-CM | POA: Diagnosis not present

## 2014-09-30 DIAGNOSIS — E1143 Type 2 diabetes mellitus with diabetic autonomic (poly)neuropathy: Secondary | ICD-10-CM

## 2014-09-30 DIAGNOSIS — Z720 Tobacco use: Secondary | ICD-10-CM

## 2014-09-30 MED ORDER — RIFAXIMIN 550 MG PO TABS: 550.0000 mg | ORAL_TABLET | Freq: Two times a day (BID) | ORAL | Status: DC

## 2014-09-30 MED ORDER — ONDANSETRON HCL 4 MG PO TABS: 4.0000 mg | ORAL_TABLET | Freq: Three times a day (TID) | ORAL | Status: DC

## 2014-09-30 NOTE — Progress Notes (Signed)
HISTORY OF PRESENT ILLNESS:  Ashley [name] is a 49 y.o. female with greater than 20 year history of diabetes mellitus (now insulin requiring), hyperlipidemia, anxiety, hypothyroidism, osteoporosis, hypertension, GERD, and a history of diabetic gastroparesis. She was last evaluated in the office July 2014 after having had problems with vomiting and diarrhea. Upper endoscopy around that time revealed benign squamous papilloma the esophagus which was removed and incidental distal esophageal ring. She had been on Zofran, but subsequently switched to pantoprazole 40 mg twice daily due to insurance formulary preference. Her current history is that of 2 months of vomiting and diarrhea. She reports constant underlying nausea with postprandial vomiting 4-5 times per day. Generally within 30 minutes of her meal. She reinstituted metoclopramide in the morning and at lunch as well as Zofran prior to lunch. She also takes promethazine 25 mg at night. Despite this, symptoms persist. She has no nocturnal symptoms. He generally does not awaken with symptoms. She reports about 6 pound weight loss over this timeframe. Her weight is identical to her last office visit. Fortunately, she has had difficulty with her diabetes. Last hemoglobin A1c 9.2. Blood sugar this afternoon 500. She continues to smoke despite being advised against such. No pyrosis or water brash. She tells me that her thyroid function has been checked in recent months and is normal  REVIEW OF SYSTEMS:  All non-GI ROS negative except for sinus and allergy, anxiety, visual change, fatigue, night sweats, ankle swelling  Past Medical History  Diagnosis Date  . Esophageal stricture   . Gastroparesis   . Diabetes mellitus   . Hyperlipidemia   . Osteoporosis   . Anxiety   . GERD (gastroesophageal reflux disease)   . Helicobacter pylori infection   . Vitamin D deficiency   . Hypertension   . Insulin pump in place     Past Surgical History  Procedure  Laterality Date  . Upper gastrointestinal endoscopy    . Breast lumpectomy      LEFT-BENIGN  . Vaginal hysterectomy  2006  . Nasal septoplasty w/ turbinoplasty Bilateral 09/07/2013    Procedure: SEPTOPLASTY, BILATERAL TURBINATE RESECTION ;  Surgeon: Ascencion Dike, MD;  Location: [place];  Service: ENT;  Laterality: Bilateral;    Social History Ashley [name]  reports that she has been smoking Cigarettes.  She has been smoking about 0.50 packs per day. She has never used smokeless tobacco. She reports that she does not drink alcohol or use illicit drugs.  family history includes Diabetes in her father. There is no history of Colon cancer, Esophageal cancer, Rectal cancer, or Stomach cancer.  Allergies  Allergen Reactions  . Clindamycin Hives  . Sulfonamide Derivatives Hives  . Lipitor [Atorvastatin] Other (See Comments)    MUSCLE ACHES       PHYSICAL EXAMINATION: Vital signs: BP 116/70 mmHg  Pulse 88  Ht 4\' 11"  (1.499 m)  Wt 137 lb 8 oz (62.37 kg)  BMI 27.76 kg/m2 General: Well-developed, well-nourished, no acute distress. Overweight HEENT: Sclerae are anicteric, conjunctiva pink. Oral mucosa intact. No thrush Lungs: Clear Heart: Regular Abdomen: soft, nontender, obese, nondistended, no obvious ascites, no peritoneal signs, normal bowel sounds. No organomegaly. No succussion splash Extremities: No edema Psychiatric: alert and oriented x3. Cooperative   ASSESSMENT:  #1. Nausea and vomiting 2 months. Likely related to poorly controlled diabetes and gastroparesis #2. GERD. Ongoing. No pyrosis on twice a day PPI #3. Mild weight loss secondary to nausea and vomiting #4. Diarrhea. Possibly diabetic  diarrhea or bacterial overgrowth. Rule out infectious #5. Multiple medical problems including poorly controlled diabetes mellitus #6. Ongoing tobacco use   PLAN:  #1. Solid phase gastric emptying scan to assess gastroparesis severity. We will contact her with  the results #2. Obtain stool study for Clostridium difficile by PCR. We will contact her with the result #3. Advised to take Zofran 3 times a day running. Prescription submitted to her pharmacy today #4. Advised to pick up Imodium over-the-counter and take as directed for diarrhea #5. Prescribed Xifaxan 550 mg by mouth twice a day 2 weeks for possible bacterial overgrowth #6. Could blood sugar control. This is the most important measure. Highly stressed #7. Continue to take Phenergan 25 mg at night #8. Advised to stop smoking (again) # 9. Return to see me for GI office follow-up in 4-6 weeks  Excluding review of records and updated in Epic information, 45 minutes was spent face-to-face with this patient, of which greater than 50% involved with counseling her on her relevant problems as outlined above  A copy of this encounter has been sent to Dr. Dagmar Hait

## 2014-09-30 NOTE — Patient Instructions (Signed)
Your physician has requested that you go to the basement for the following lab work before leaving today:  C Diff  We have sent the following medications to your pharmacy for you to pick up at your convenience:  Zofran, Xifaxan  Use Phenergan at night.  Use Imodium as discussed.  Continue working on good blood sugar control  You have been scheduled for a gastric emptying scan at [facility] on 10/19/2014 at 9:30am. Please arrive at least 15 minutes prior to your appointment for registration. Please make certain not to have anything to eat or drink after midnight the night before your test. Hold all stomach medications (ex: Zofran, phenergan, Reglan) 48 hours prior to your test. If you need to reschedule your appointment, please contact radiology scheduling at [phone]. _____________________________________________________________________ A gastric-emptying study measures how long it takes for food to move through your stomach. There are several ways to measure stomach emptying. In the most common test, you eat food that contains a small amount of radioactive material. A scanner that detects the movement of the radioactive material is placed over your abdomen to monitor the rate at which food leaves your stomach. This test normally takes about 2 hours to complete. _____________________________________________________________________

## 2014-10-19 ENCOUNTER — Encounter (HOSPITAL_COMMUNITY)
Admission: RE | Admit: 2014-10-19 | Discharge: 2014-10-19 | Disposition: A | Discharge status: Home / Self Care | Payer: BLUE CROSS/BLUE SHIELD | Source: Ambulatory Visit | Attending: Internal Medicine | Admitting: Internal Medicine

## 2014-10-19 DIAGNOSIS — R112 Nausea with vomiting, unspecified: Secondary | ICD-10-CM | POA: Insufficient documentation

## 2014-10-19 DIAGNOSIS — K219 Gastro-esophageal reflux disease without esophagitis: Secondary | ICD-10-CM | POA: Diagnosis present

## 2014-10-19 DIAGNOSIS — R110 Nausea: Secondary | ICD-10-CM | POA: Diagnosis not present

## 2014-10-19 DIAGNOSIS — R197 Diarrhea, unspecified: Secondary | ICD-10-CM | POA: Diagnosis present

## 2014-10-19 MED ORDER — TECHNETIUM TC 99M SULFUR COLLOID
2.2000 | Freq: Once | INTRAVENOUS | Status: AC | PRN
  Administered 2014-10-19: 2.2 via ORAL

## 2014-11-01 ENCOUNTER — Ambulatory Visit (INDEPENDENT_AMBULATORY_CARE_PROVIDER_SITE_OTHER): Payer: BLUE CROSS/BLUE SHIELD | Admitting: Internal Medicine

## 2014-11-01 ENCOUNTER — Encounter: Payer: Self-pay | Admitting: Internal Medicine

## 2014-11-01 VITALS — BP 126/72 | HR 88 | Ht 59.5 in | Wt 140.2 lb

## 2014-11-01 DIAGNOSIS — R197 Diarrhea, unspecified: Secondary | ICD-10-CM | POA: Diagnosis not present

## 2014-11-01 DIAGNOSIS — R112 Nausea with vomiting, unspecified: Secondary | ICD-10-CM

## 2014-11-01 DIAGNOSIS — K219 Gastro-esophageal reflux disease without esophagitis: Secondary | ICD-10-CM | POA: Diagnosis not present

## 2014-11-01 DIAGNOSIS — K146 Glossodynia: Secondary | ICD-10-CM | POA: Diagnosis not present

## 2014-11-01 MED ORDER — CLOTRIMAZOLE 10 MG MT TROC: 10.0000 mg | Freq: Every day | OROMUCOSAL | Status: DC

## 2014-11-01 NOTE — Progress Notes (Signed)
HISTORY OF PRESENT ILLNESS:  Ashley [name] is a 49 y.o. female who was evaluated 09/30/2014 for nausea and vomiting, GERD, mild weight loss, and diarrhea. At that time she had ongoing tobacco abuse and poorly controlled diabetes. Gastric empty scan was obtained and found to be normal. Stool studies for Clostridium difficile requested but not performed by the patient. Fortunately, her diarrhea has resolved after she had been prescribed a course of Xifaxan for 2 weeks. She was advised to improve blood sugar control and stop smoking. She states her blood sugars have improved. She takes Zofran 3 times daily and Phenergan as needed. She continues with low-grade nausea but no vomiting. Chief complaint today is burning sensation in the tongue and throat. She has had thrush before.  REVIEW OF SYSTEMS:  All non-GI ROS negative except for sinus and allergy, fatigue, night sweats, excessive thirst, excessive urination  Past Medical History  Diagnosis Date  . Esophageal stricture   . Gastroparesis   . Diabetes mellitus   . Hyperlipidemia   . Osteoporosis   . Anxiety   . GERD (gastroesophageal reflux disease)   . Helicobacter pylori infection   . Vitamin D deficiency   . Hypertension   . Insulin pump in place     Past Surgical History  Procedure Laterality Date  . Upper gastrointestinal endoscopy    . Breast lumpectomy      LEFT-BENIGN  . Vaginal hysterectomy  2006  . Nasal septoplasty w/ turbinoplasty Bilateral 09/07/2013    Procedure: SEPTOPLASTY, BILATERAL TURBINATE RESECTION ;  Surgeon: Ascencion Dike, MD;  Location: [place];  Service: ENT;  Laterality: Bilateral;    Social History [name]  reports that she has been smoking Cigarettes.  She has been smoking about 0.50 packs per day. She has never used smokeless tobacco. She reports that she does not drink alcohol or use illicit drugs.  family history includes Diabetes in her father. There is no history of Colon  cancer, Esophageal cancer, Rectal cancer, or Stomach cancer.  Allergies  Allergen Reactions  . Clindamycin Hives  . Sulfonamide Derivatives Hives  . Lipitor [Atorvastatin] Other (See Comments)    MUSCLE ACHES       PHYSICAL EXAMINATION: Vital signs: BP 126/72 mmHg  Pulse 88  Ht 4' 11.5" (1.511 m)  Wt 140 lb 3.2 oz (63.594 kg)  BMI 27.85 kg/m2 General: Well-developed, well-nourished, no acute distress HEENT: Sclerae are anicteric, conjunctiva pink. Oral mucosa intact and minimally erythematous. No obvious thrush Lungs: Clear Heart: Regular Abdomen: soft, nontender, nondistended, no obvious ascites, no peritoneal signs, normal bowel sounds. No organomegaly. Extremities: No edema Psychiatric: alert and oriented x3. Cooperative   ASSESSMENT:  #1. Nausea. Improved but ongoing. No further vomiting. Likely due to improved blood sugar control. Normal gastric empty scan. EGD 2014 for the same revealed distal esophageal ring and benign squamous papilloma which was removed #2. Diarrhea. Resolved after Xifaxan. Possibly bacterial overgrowth #3. Tongue and pharyngeal burning. Mild erythema. Question early thrush in diabetic #4. GERD. On PPI   PLAN:  #1. Continue Zofran as needed as well as Phenergan #2. Continue improved control diabetes #3. Stop smoking #4. Prescribe Mycelex Trush 5 times daily for possible early thrush #5. Continue PPI for GERD  #6. Routine GI follow-up one year. Will be due for screening colonoscopy at age 60

## 2014-11-01 NOTE — Patient Instructions (Signed)
We have sent medications to your pharmacy for you to pick up at your convenience. CC:  Ravisankar AVVA MD

## 2015-01-14 ENCOUNTER — Encounter: Payer: Self-pay | Admitting: Internal Medicine

## 2015-04-20 ENCOUNTER — Telehealth (HOSPITAL_COMMUNITY): Payer: Self-pay | Admitting: *Deleted

## 2015-04-20 ENCOUNTER — Other Ambulatory Visit (HOSPITAL_COMMUNITY): Payer: Self-pay | Admitting: Internal Medicine

## 2015-04-20 DIAGNOSIS — R0789 Other chest pain: Secondary | ICD-10-CM

## 2015-04-26 ENCOUNTER — Ambulatory Visit (INDEPENDENT_AMBULATORY_CARE_PROVIDER_SITE_OTHER): Payer: 59 | Admitting: Internal Medicine

## 2015-04-26 ENCOUNTER — Encounter: Payer: Self-pay | Admitting: Internal Medicine

## 2015-04-26 VITALS — BP 134/78 | HR 87 | Ht 59.5 in | Wt 145.0 lb

## 2015-04-26 DIAGNOSIS — R0689 Other abnormalities of breathing: Secondary | ICD-10-CM

## 2015-04-26 DIAGNOSIS — R0600 Dyspnea, unspecified: Secondary | ICD-10-CM | POA: Diagnosis not present

## 2015-04-26 DIAGNOSIS — R0789 Other chest pain: Secondary | ICD-10-CM | POA: Diagnosis not present

## 2015-04-26 DIAGNOSIS — R490 Dysphonia: Secondary | ICD-10-CM | POA: Diagnosis not present

## 2015-04-26 LAB — NITRIC OXIDE: Nitric Oxide: 15

## 2015-04-26 NOTE — Patient Instructions (Signed)
ICD-9-CM ICD-10-CM   1. Dyspnea and respiratory abnormality 786.09 R06.00     R06.89   2. Chest tightness 786.59 R07.89   3. Hoarseness of voice 784.42 R49.0    No evidence of asthma Do CPST bike test  Refer Dr ENT Dr Benjamine Mola Keep up cardiology appt - give name of doctor to our office  Followup - after completing above - 2-4 weeks from now with me or my NP Tammy

## 2015-04-26 NOTE — Progress Notes (Signed)
Subjective:    Patient ID: Ashley [name], female    DOB: 08/31/1965, 49 y.o.   MRN: [id number]  HPI PCP Tivis Ringer, MD -  Allergist  - Dr Donneta Romberg - -referring  IOV 04/26/2015  Chief Complaint  Patient presents with  . Pulmonary Consult    Pt referred by Dr. Donneta Romberg for asthma. Pt c/o DOE, dry cough, chest pain when SOB.    Ashley [name] is a 49 year old female referred by Dr. Donneta Romberg. Patient reports a two-month history of insidious onset of shortness of breath associated with hoarse voice chest tightness and wheezing and some mild cough. Between these symptoms shortness of breath and chest tightness associated with hoarseness of voice other most significant. Symptoms are rated as severe. They have been progressive since onset. At the time of onset she was diagnosed with bilateral ear infections and acute bronchitis. She recollects being told she had a normal chest x-ray. Since then she's been treated with several rounds of antibiotics according to her history and steroid injection/prednisone. None of these have helped. Walking 50 feet, talking a lot, laughing makes symptoms worse. Resting makes them better. Drinking water does not make any difference. She does not feel a tickle in her throat. She is frustrated by her level of symptoms. And there for she's been referred here. She was given a trial of Brio inhaler by Dr. Donneta Romberg but this has not helped. Currently she is not on any. Last dose of steroids or antibiotics or inhaler was several weeks ago.  On the - 5. asthma control questionnaire is significant for symptoms -  she wakes up several times in the night because of asthma. When she wakes up she feels she has moderate symptoms because of asthma. She feels her activities of very slightly limited because of asthma. She experiences shortness of breath a very great deal because of asthma. She wheezes most of the time and she uses albuterol between 9 and 12 times for rescue on each day.  VERY SYMPTOMATCH with AVG SCOORE of 3.6  Exhaled nitric oxide today in our pulmonary office - is normal at 15 ppb and the presence of a beta blocker and also Singulair. But she is not on any asthma inhaler. Last pred or abx was several weeks ago  Walking desaturation test 185 feet 3 laps on room air: Did not desaturate. Pulse ox remained at 100% the entire time. HAD ADUIDBLE UPPER AIRWAY NOISE AFTER Walking  There is a mismatch between her symptoms which exaggerated compared to her exam nitric oxide which is normal   Past medical history review   - She continues to smoke -  CT scan of the chest 01/07/2010 done as pulmonary embolism protocol ruled out pulmonary embolism. The lung fields were personally visualized by me and they're clear.   - Most recent blood work in the [employer] system normal creatinine 09/03/2013 but anemic on a hemoglobin of 9.9 g percent August 2011  Review of the outside medical records sent by Dr. Donneta Romberg  Negative skin testing in 2015 -Never been on immunotherapy -Status post sinus surgery by ENT Dr Benjamine Mola  - several years ago according to the patient  -Office spirometry 01/21/2015 at the [facility] was normal - She has a diagnosis of vasomotor rhinitis and chronic allergic conjunctivitis associated with acid reflux and chronic cough    has a past medical history of Esophageal stricture; Gastroparesis; Diabetes mellitus ([place]); Hyperlipidemia; Osteoporosis; Anxiety; GERD (gastroesophageal reflux disease); Helicobacter pylori infection; Vitamin  D deficiency; Hypertension; and Insulin pump in place.   reports that she has been smoking Cigarettes.  She has a 12.5 pack-year smoking history. She has never used smokeless tobacco.  Past Surgical History  Procedure Laterality Date  . Upper gastrointestinal endoscopy    . Breast lumpectomy      LEFT-BENIGN  . Vaginal hysterectomy  2006  . Nasal septoplasty w/ turbinoplasty Bilateral 09/07/2013    Procedure:  SEPTOPLASTY, BILATERAL TURBINATE RESECTION ;  Surgeon: Ascencion Dike, MD;  Location: [place];  Service: ENT;  Laterality: Bilateral;    Allergies  Allergen Reactions  . Clindamycin Hives  . Sulfonamide Derivatives Hives  . Lipitor [Atorvastatin] Other (See Comments)    MUSCLE ACHES     There is no immunization history on file for this patient.  Family History  Problem Relation Age of Onset  . Diabetes Father   . Colon cancer Neg Hx   . Esophageal cancer Neg Hx   . Rectal cancer Neg Hx   . Stomach cancer Neg Hx      Current outpatient prescriptions:  .  ACCU-CHEK COMPACT PLUS test strip, 1 each by Other route as needed. , Disp: , Rfl:  .  ALPRAZolam (XANAX) 0.5 MG tablet, Take 0.5 mg by mouth 3 (three) times daily as needed. , Disp: , Rfl:  .  AMBULATORY NON FORMULARY MEDICATION, NOVOLOG INSULIN PUMP USES AS DIRECTED, Disp: , Rfl:  .  Azelastine HCl 0.15 % SOLN, Place 1 spray into the nose daily., Disp: , Rfl:  .  dexlansoprazole (DEXILANT) 60 MG capsule, Take 60 mg by mouth daily., Disp: , Rfl:  .  fluticasone (FLONASE) 50 MCG/ACT nasal spray, Place 1 spray into the nose daily. , Disp: , Rfl:  .  gabapentin (NEURONTIN) 300 MG capsule, Take 1 capsule by mouth at bedtime. , Disp: , Rfl:  .  levocetirizine (XYZAL) 5 MG tablet, Take 5 mg by mouth every evening., Disp: , Rfl:  .  levothyroxine (SYNTHROID, LEVOTHROID) 100 MCG tablet, Take 1 tablet by mouth daily., Disp: , Rfl:  .  losartan (COZAAR) 25 MG tablet, One by mouth once daily, Disp: , Rfl:  .  metoprolol succinate (TOPROL-XL) 50 MG 24 hr tablet, One tablet by mouth once daily, Disp: , Rfl:  .  montelukast (SINGULAIR) 10 MG tablet, Take 10 mg by mouth at bedtime., Disp: , Rfl:  .  NOVOLOG 100 UNIT/ML injection, Insulin Pump, Disp: , Rfl:  .  ondansetron (ZOFRAN) 4 MG tablet, Take 1 tablet (4 mg total) by mouth 3 (three) times daily. (Patient taking differently: Take 4 mg by mouth 3 (three) times daily as  needed. ), Disp: 90 tablet, Rfl: 3 .  pravastatin (PRAVACHOL) 20 MG tablet, Take 1 tablet by mouth at bedtime., Disp: , Rfl:  .  promethazine (PHENERGAN) 25 MG tablet, Take 1 tablet by mouth. Every 4-6 hours as needed, Disp: , Rfl:  .  venlafaxine XR (EFFEXOR-XR) 75 MG 24 hr capsule, Take 150 mg by mouth daily. , Disp: , Rfl:  .  Vitamin D, Ergocalciferol, (DRISDOL) 50000 UNITS CAPS, Take 50,000 Units by mouth every 7 (seven) days., Disp: , Rfl:      Review of Systems  Constitutional: Negative for fever and unexpected weight change.  HENT: Negative for congestion, dental problem, ear pain, nosebleeds, postnasal drip, rhinorrhea, sinus pressure, sneezing, sore throat and trouble swallowing.   Eyes: Negative for redness and itching.  Respiratory: Positive for cough and shortness of breath.  Negative for chest tightness and wheezing.   Cardiovascular: Negative for palpitations and leg swelling.  Gastrointestinal: Negative for nausea and vomiting.  Genitourinary: Negative for dysuria.  Musculoskeletal: Negative for joint swelling.  Skin: Negative for rash.  Neurological: Negative for headaches.  Hematological: Does not bruise/bleed easily.  Psychiatric/Behavioral: Negative for dysphoric mood. The patient is not nervous/anxious.        Objective:   Physical Exam  Constitutional: She is oriented to person, place, and time. She appears well-developed and well-nourished. No distress.  HENT:  Head: Normocephalic and atraumatic.  Right Ear: External ear normal.  Left Ear: External ear normal.  Mouth/Throat: Oropharynx is clear and moist. No oropharyngeal exudate.  Extremely hoarse voice' Audible ? Mild striidor noise after walking  Eyes: Conjunctivae and EOM are normal. Pupils are equal, round, and reactive to light. Right eye exhibits no discharge. Left eye exhibits no discharge. No scleral icterus.  Neck: Normal range of motion. Neck supple. No JVD present. No tracheal deviation present.  No thyromegaly present.  Cardiovascular: Normal rate, regular rhythm, normal heart sounds and intact distal pulses.  Exam reveals no gallop and no friction rub.   No murmur heard. Pulmonary/Chest: Effort normal and breath sounds normal. No respiratory distress. She has no wheezes. She has no rales. She exhibits no tenderness.  Abdominal: Soft. Bowel sounds are normal. She exhibits no distension and no mass. There is no tenderness. There is no rebound and no guarding.  Musculoskeletal: Normal range of motion. She exhibits no edema or tenderness.  Lymphadenopathy:    She has no cervical adenopathy.  Neurological: She is alert and oriented to person, place, and time. She has normal reflexes. No cranial nerve deficit. She exhibits normal muscle tone. Coordination normal.  Skin: Skin is warm and dry. No rash noted. She is not diaphoretic. No erythema. No pallor.  Psychiatric: She has a normal mood and affect. Her behavior is normal. Judgment and thought content normal.  Vitals reviewed.   Filed Vitals:   04/26/15 1538  BP: 134/78  Pulse: 87  Height: 4' 11.5" (1.511 m)  Weight: 145 lb (65.772 kg)  SpO2: 98%         Assessment & Plan:     ICD-9-CM ICD-10-CM   1. Dyspnea and respiratory abnormality 786.09 R06.00     R06.89   2. Chest tightness 786.59 R07.89   3. Hoarseness of voice 784.42 R49.0    My suspicion is that she has airway neuropathy the equivalent of which would be cyclical cough or irritable larynx syndrome but manifested as dyspnea chest tightness and hoarseness of voice. I base this on the fact she had rest per infection followed by her current symptoms and extreme hoarseness of voice which actually got worse after minimal exertion in the office.The treatment for this typically would be speech therapy and high-dose gabapentin. However given her diabetes and smoking history it is best to rule out actual pathology. Therefore I've asked her to proceed with a cardiology  appointment and to keep it. Also ordered cardiopulmonary stress test [I have assumed that her chest x-ray is normal based on her history and review of the outside medical record]. We'll also have ENT see her again in order to ensure there is  is no vocal cord pathology   PLAN  No evidence of asthma Do CPST bike test  Refer Dr ENT Dr Benjamine Mola Keep up cardiology appt - give name of doctor to our office  Followup - after completing above - 2-4  weeks from now with me or my NP Tammy    Dr. Brand Males, M.D., [facility].C.P Pulmonary and Critical Care Medicine Staff Physician [place] [facility] Pager: [phone], If no answer or between  15:00h - 7:00h: call [phone]  04/26/2015 4:22 PM

## 2015-04-26 NOTE — Addendum Note (Signed)
Addended by: Maurice March on: 04/26/2015 04:53 PM   Modules accepted: Orders

## 2015-04-28 ENCOUNTER — Ambulatory Visit (HOSPITAL_COMMUNITY): Payer: 59 | Attending: Cardiovascular Disease

## 2015-04-28 ENCOUNTER — Other Ambulatory Visit: Payer: Self-pay

## 2015-04-28 DIAGNOSIS — R0600 Dyspnea, unspecified: Secondary | ICD-10-CM | POA: Insufficient documentation

## 2015-04-28 DIAGNOSIS — R0789 Other chest pain: Secondary | ICD-10-CM | POA: Diagnosis not present

## 2015-04-28 DIAGNOSIS — F17200 Nicotine dependence, unspecified, uncomplicated: Secondary | ICD-10-CM | POA: Diagnosis not present

## 2015-04-28 DIAGNOSIS — R079 Chest pain, unspecified: Secondary | ICD-10-CM | POA: Diagnosis not present

## 2015-04-28 DIAGNOSIS — I340 Nonrheumatic mitral (valve) insufficiency: Secondary | ICD-10-CM | POA: Insufficient documentation

## 2015-04-28 DIAGNOSIS — I517 Cardiomegaly: Secondary | ICD-10-CM | POA: Insufficient documentation

## 2015-04-28 DIAGNOSIS — E119 Type 2 diabetes mellitus without complications: Secondary | ICD-10-CM | POA: Diagnosis not present

## 2015-05-03 ENCOUNTER — Ambulatory Visit (HOSPITAL_COMMUNITY): Payer: 59 | Attending: Internal Medicine

## 2015-05-03 DIAGNOSIS — R0600 Dyspnea, unspecified: Secondary | ICD-10-CM | POA: Diagnosis present

## 2015-05-03 DIAGNOSIS — R0789 Other chest pain: Secondary | ICD-10-CM

## 2015-05-03 DIAGNOSIS — R0689 Other abnormalities of breathing: Secondary | ICD-10-CM

## 2015-05-09 ENCOUNTER — Telehealth: Payer: Self-pay | Admitting: Internal Medicine

## 2015-05-09 NOTE — Telephone Encounter (Signed)
Auth# [id number] pt aware and has already talked to [facility]

## 2015-05-19 ENCOUNTER — Telehealth: Payer: Self-pay | Admitting: Internal Medicine

## 2015-05-19 NOTE — Telephone Encounter (Signed)
Called spoke w/ pt. She is requesting her CPST results. She also wants these sent over to her PCP attn: Webb Silversmith. Please advise Dr. Chase Caller. thanks

## 2015-05-20 NOTE — Telephone Encounter (Signed)
Pt aware that we are going to go ahead and send her CPST results to her PCP Dr Dagmar Hait. Records sent. Nothing further needed.

## 2015-05-20 NOTE — Telephone Encounter (Signed)
Yes send it to pcp Tivis Ringer, MD\ today -whatever docs she wants

## 2015-05-20 NOTE — Telephone Encounter (Signed)
She has an appt [phone] wih Tammy Parrett to discuss results. That is why I have not called her. You can read this result to her but needs to be put in contextt and that is whys he is seeing TP. When TP sees her the note with result wkill be sent to pcp AVVA,RAVISANKAR R, MD    Conclusion: The interpretation of this test is limited due to submaximal effort during the exercise. Based on available data, exercise testing with gas exchange demonstrates a moderate to severe functional impairment. There is some evidence that the patient's body habitus is contributing to her limitation.The patient does not appear to have any significant ventilatory or circulatory limitations,    Test, report and preliminary impression by: Landis Martins, MS, ACSM-RCEP 05/04/2015 11:05 AM  Edited, staffed and finalized by  Dr. Brand Males, M.D., F.C.C.P Pulmonary and Critical Care Medicine Staff Physician [place] [facility] Pager: [phone], If no answer or between 15:00h - 7:00h: call [phone]

## 2015-05-20 NOTE — Telephone Encounter (Signed)
Pt aware of rec's below. Requests that we go ahead and send results to Dr Dagmar Hait. Explained that these would be sent with OV notes from upcoming visit with TP. Pt states that her (FMLA) Leave runs out 05/28/15 and she will no longer be getting paid unless Dr Dagmar Hait produces results and a letter for her to have continued leave. Pt states that without these results Dr Dagmar Hait cannot write her letter. Please advise if you are okay with [place] sending the results before patient sees TP. Pt states that she plans to keep her upcoming visit with TP on 06/02/15. Please advise MR. Thanks.

## 2015-06-02 ENCOUNTER — Encounter: Payer: Self-pay | Admitting: Adult Health

## 2015-06-02 ENCOUNTER — Ambulatory Visit (INDEPENDENT_AMBULATORY_CARE_PROVIDER_SITE_OTHER): Payer: 59 | Admitting: Adult Health

## 2015-06-02 VITALS — BP 124/80 | HR 88 | Temp 98.7°F | Ht 59.0 in | Wt 147.0 lb

## 2015-06-02 DIAGNOSIS — R490 Dysphonia: Secondary | ICD-10-CM | POA: Diagnosis not present

## 2015-06-02 DIAGNOSIS — R0600 Dyspnea, unspecified: Secondary | ICD-10-CM

## 2015-06-02 DIAGNOSIS — R0689 Other abnormalities of breathing: Secondary | ICD-10-CM | POA: Diagnosis not present

## 2015-06-02 NOTE — Assessment & Plan Note (Signed)
Dyspnea -CPST shows submaximal effort w/ exercise , ? Deconditioning component  Ongoing workup with nml spirometry and low exhaled nitric oxide.  Does have upper airway irritation with GERD per ENT   Plan  Continue on current regimen Advance activity as tolerated GERD prevention .  Follow up Dr. Chase Caller in 3-4 months and As needed   Smoking cessation

## 2015-06-02 NOTE — Progress Notes (Signed)
   Subjective:    Patient ID: Ashley [name], female    DOB: April 13, 1966, 49 y.o.   MRN: [id number]  HPI 49 yo female seen for pulmonary consult on 04/26/15 for dyspnea with Dr. Chase Caller   TEST  Review of the outside medical records sent by Dr. Donneta Romberg Negative skin testing in 2015 -Never been on immunotherapy -Status post sinus surgery by ENT Dr Benjamine Mola - several years ago according to the patient  -Office spirometry [facility]/21/2015 [facility] was normal  CPST 05/03/15 , neg for card changes, submaximal effort during exercise, moderate to severe functional imprirment . Some evidence of body habitus contributing to her limitation  Echo 04/28/2015 >>nml EF .    06/02/2015 Follow up : Dyspnea  Patient returns for a one-month follow-up. Patient was seen last month for a pulmonary consult for ongoing dyspnea and cough with hoarseness after bronchitis for last 3-4 months .  Pt had an exhaled nitric oxide test that was low.  CXR reported as normal.  Seen by ENT, told GERD was contributing to her hoarsness.  Had echo that showed nml EF.  Set up for CPST that showed submaximal effort during exercise. Exercise testing with gas exchange demonstrates a moderate to severe functional impairment. There was some evidence that the patient's body habitus was contributing to her limitation.  On Beta blocker .  Patient continues smoke. We discussed smoking cessation Does feel that she is slowly starting to get better..     Review of Systems  Constitutional:   No  weight loss, night sweats,  Fevers, chills,  +fatigue, or  lassitude.  HEENT:   No headaches,  Difficulty swallowing,  Tooth/dental problems, or  Sore throat,                No sneezing, itching, ear ache, nasal congestion, post nasal drip,   CV:  No chest pain,  Orthopnea, PND, swelling in lower extremities, anasarca, dizziness, palpitations, syncope.   GI  No heartburn, indigestion, abdominal pain, nausea, vomiting, diarrhea,  change in bowel habits, loss of appetite, bloody stools.   Resp:  .  No chest wall deformity  Skin: no rash or lesions.  GU: no dysuria, change in color of urine, no urgency or frequency.  No flank pain, no hematuria   MS:  No joint pain or swelling.  No decreased range of motion.  No back pain.  Psych:  No change in mood or affect. No depression or anxiety.  No memory loss.          Objective:   Physical Exam GEN: A/Ox3; pleasant , NAD, overweight   HEENT:  [place]/AT,  EACs-clear, TMs-wnl, NOSE-clear, THROAT-clear, no lesions, no postnasal drip or exudate noted.   NECK:  Supple w/ fair ROM; no JVD; normal carotid impulses w/o bruits; no thyromegaly or nodules palpated; no lymphadenopathy.  RESP  Clear  P & A; w/o, wheezes/ rales/ or rhonchi.no accessory muscle use, no dullness to percussion  CARD:  RRR, no m/r/g  , no peripheral edema, pulses intact, no cyanosis or clubbing.  GI:   Soft & nt; nml bowel sounds; no organomegaly or masses detected.  Musco: Warm bil, no deformities or joint swelling noted.   Neuro: alert, no focal deficits noted.    Skin: Warm, no lesions or rashes '       Assessment & Plan:

## 2015-06-02 NOTE — Patient Instructions (Addendum)
Continue on current regimen Advance activity as tolerated GERD prevention .  Follow up Dr. Chase Caller in 3-4 months and As needed   Smoking cessation

## 2015-06-02 NOTE — Assessment & Plan Note (Signed)
ENT eval with reflux suspected GERD and Zantac

## 2015-07-19 ENCOUNTER — Emergency Department (HOSPITAL_COMMUNITY)
Admission: EM | Admit: 2015-07-19 | Discharge: 2015-07-20 | Disposition: A | Discharge status: Home / Self Care | Payer: 59 | Attending: Emergency Medicine | Admitting: Emergency Medicine

## 2015-07-19 ENCOUNTER — Encounter (HOSPITAL_COMMUNITY): Payer: Self-pay | Admitting: Emergency Medicine

## 2015-07-19 ENCOUNTER — Emergency Department (HOSPITAL_COMMUNITY): Payer: 59

## 2015-07-19 DIAGNOSIS — Y9289 Other specified places as the place of occurrence of the external cause: Secondary | ICD-10-CM | POA: Diagnosis not present

## 2015-07-19 DIAGNOSIS — Y9301 Activity, walking, marching and hiking: Secondary | ICD-10-CM | POA: Insufficient documentation

## 2015-07-19 DIAGNOSIS — S93601A Unspecified sprain of right foot, initial encounter: Secondary | ICD-10-CM | POA: Insufficient documentation

## 2015-07-19 DIAGNOSIS — Z8619 Personal history of other infectious and parasitic diseases: Secondary | ICD-10-CM | POA: Insufficient documentation

## 2015-07-19 DIAGNOSIS — Z7951 Long term (current) use of inhaled steroids: Secondary | ICD-10-CM | POA: Diagnosis not present

## 2015-07-19 DIAGNOSIS — Z79899 Other long term (current) drug therapy: Secondary | ICD-10-CM | POA: Insufficient documentation

## 2015-07-19 DIAGNOSIS — E119 Type 2 diabetes mellitus without complications: Secondary | ICD-10-CM | POA: Diagnosis not present

## 2015-07-19 DIAGNOSIS — K219 Gastro-esophageal reflux disease without esophagitis: Secondary | ICD-10-CM | POA: Insufficient documentation

## 2015-07-19 DIAGNOSIS — E785 Hyperlipidemia, unspecified: Secondary | ICD-10-CM | POA: Insufficient documentation

## 2015-07-19 DIAGNOSIS — F419 Anxiety disorder, unspecified: Secondary | ICD-10-CM | POA: Insufficient documentation

## 2015-07-19 DIAGNOSIS — S99921A Unspecified injury of right foot, initial encounter: Secondary | ICD-10-CM | POA: Diagnosis present

## 2015-07-19 DIAGNOSIS — I10 Essential (primary) hypertension: Secondary | ICD-10-CM | POA: Insufficient documentation

## 2015-07-19 DIAGNOSIS — F17210 Nicotine dependence, cigarettes, uncomplicated: Secondary | ICD-10-CM | POA: Diagnosis not present

## 2015-07-19 DIAGNOSIS — E559 Vitamin D deficiency, unspecified: Secondary | ICD-10-CM | POA: Diagnosis not present

## 2015-07-19 DIAGNOSIS — W108XXA Fall (on) (from) other stairs and steps, initial encounter: Secondary | ICD-10-CM | POA: Diagnosis not present

## 2015-07-19 DIAGNOSIS — Y998 Other external cause status: Secondary | ICD-10-CM | POA: Insufficient documentation

## 2015-07-19 NOTE — ED Notes (Signed)
Patient was walking downstairs and hit the gravel head on. She dont know what she done to the right foot. The foot and ankle is swollen. Left shin is scratched up but no pain.

## 2015-07-20 MED ORDER — TRAMADOL HCL 50 MG PO TABS: 50.0000 mg | ORAL_TABLET | Freq: Four times a day (QID) | ORAL | Status: DC | PRN

## 2015-07-20 NOTE — ED Notes (Addendum)
Went over discharge paperwork with patient. Patient signed out on downtime form. Vital signs and pain assessment completed.

## 2015-07-20 NOTE — ED Provider Notes (Signed)
CSN: [id number]     Arrival date & time 07/19/15  2259 History   First MD Initiated Contact with Patient 07/19/15 2356     Chief Complaint  Patient presents with  . Foot Injury     (Consider location/radiation/quality/duration/timing/severity/associated sxs/prior Treatment) HPI   Patient presents to the emergency department with complaints of falling down the stairs around 4 PM this evening. She denies hitting her head, injuring her neck or loss of consciousness. She complains of pain to the right midfoot. She has been able to walk on it but with discomfort. She has not had anything for pain prior to arrival.  Past Medical History  Diagnosis Date  . Esophageal stricture   . Gastroparesis   . Diabetes mellitus ([place])   . Hyperlipidemia   . Osteoporosis   . Anxiety   . GERD (gastroesophageal reflux disease)   . Helicobacter pylori infection   . Vitamin D deficiency   . Hypertension   . Insulin pump in place    Past Surgical History  Procedure Laterality Date  . Upper gastrointestinal endoscopy    . Breast lumpectomy      LEFT-BENIGN  . Vaginal hysterectomy  2006  . Nasal septoplasty w/ turbinoplasty Bilateral 09/07/2013    Procedure: SEPTOPLASTY, BILATERAL TURBINATE RESECTION ;  Surgeon: Ascencion Dike, MD;  Location: [facility];  Service: ENT;  Laterality: Bilateral;   Family History  Problem Relation Age of Onset  . Diabetes Father   . Colon cancer Neg Hx   . Esophageal cancer Neg Hx   . Rectal cancer Neg Hx   . Stomach cancer Neg Hx    Social History  Substance Use Topics  . Smoking status: Current Some Day Smoker -- 0.50 packs/day for 25 years    Types: Cigarettes  . Smokeless tobacco: Never Used     Comment: 6-8 CIGS A DAY  . Alcohol Use: No   OB History    No data available     Review of Systems  Review of Systems All other systems negative except as documented in the HPI. All pertinent positives and negatives as reviewed in the  HPI.   Allergies  Clindamycin; Sulfonamide derivatives; and Lipitor  Home Medications   Prior to Admission medications   Medication Sig Start Date End Date Taking? Authorizing Provider  ACCU-CHEK COMPACT PLUS test strip 1 each by Other route as needed.  10/20/12   Historical Provider, MD  albuterol (PROVENTIL) (2.5 MG/3ML) 0.083% nebulizer solution Use every 2 hours as needed for severe wheezing for the next 3 days then decrease to every 4-6 hours 03/18/15   Historical Provider, MD  ALPRAZolam Duanne Moron) 0.5 MG tablet Take 0.5 mg by mouth 3 (three) times daily as needed.  10/08/12   Historical Provider, MD  AMBULATORY NON [place] INSULIN PUMP USES AS DIRECTED    Historical Provider, MD  Azelastine HCl 0.15 % SOLN Place 1 spray into the nose daily. 08/03/14   Historical Provider, MD  budesonide (PULMICORT) 0.5 MG/2ML nebulizer solution Inhale 1 vial twice daily 03/23/15   Historical Provider, MD  dexlansoprazole (DEXILANT) 60 MG capsule Take 60 mg by mouth daily.    Historical Provider, MD  fluticasone (FLONASE) 50 MCG/ACT nasal spray Place 1 spray into the nose daily.  10/08/12   Historical Provider, MD  gabapentin (NEURONTIN) 300 MG capsule Take 1 capsule by mouth at bedtime.  06/23/14   Historical Provider, MD  levocetirizine (XYZAL) 5 MG tablet Take 5 mg  by mouth every evening.    Historical Provider, MD  levothyroxine (SYNTHROID, LEVOTHROID) 100 MCG tablet Take 1 tablet by mouth daily. 08/31/14   Historical Provider, MD  losartan (COZAAR) 25 MG tablet One by mouth once daily 10/08/12   Historical Provider, MD  metoprolol succinate (TOPROL-XL) 50 MG 24 hr tablet One tablet by mouth once daily 09/26/12   Historical Provider, MD  MINIVELLE 0.025 MG/24HR Apply 1 patch twice weekly as directed 03/29/15   Historical Provider, MD  montelukast (SINGULAIR) 10 MG tablet Take 10 mg by mouth at bedtime.    Historical Provider, MD  ondansetron (ZOFRAN) 4 MG tablet Take 1 tablet (4 mg total)  by mouth 3 (three) times daily. Patient taking differently: Take 4 mg by mouth 3 (three) times daily as needed.  09/30/14   Irene Shipper, MD  pravastatin (PRAVACHOL) 20 MG tablet Take 1 tablet by mouth at bedtime. 07/13/14   Historical Provider, MD  PROAIR HFA 108 (90 BASE) MCG/ACT inhaler Inhale 2 puffs into the lungs every 4 (four) hours as needed. 04/07/15   Historical Provider, MD  promethazine (PHENERGAN) 25 MG tablet Take 1 tablet by mouth. Every 4-6 hours as needed 08/30/14   Historical Provider, MD  traMADol (ULTRAM) 50 MG tablet Take 1 tablet (50 mg total) by mouth every 6 (six) hours as needed. 07/20/15   Carlinda Ohlson Carlota Raspberry, PA-C  venlafaxine XR (EFFEXOR-XR) 75 MG 24 hr capsule Take 150 mg by mouth daily.     Historical Provider, MD  Vitamin D, Ergocalciferol, (DRISDOL) 50000 UNITS CAPS Take 50,000 Units by mouth every 7 (seven) days.    Historical Provider, MD   BP 156/91 mmHg  Pulse 92  Temp(Src) 98.4 F (36.9 C) (Oral)  Resp 20  Ht 5\' 7"  (1.702 m)  Wt 111.131 kg  BMI 38.36 kg/m2  SpO2 98% Physical Exam  Constitutional: She appears well-developed and well-nourished. No distress.  HENT:  Head: Normocephalic and atraumatic.  Eyes: Pupils are equal, round, and reactive to light.  Neck: Normal range of motion. Neck supple.  Cardiovascular: Normal rate and regular rhythm.   Pulmonary/Chest: Effort normal.  Abdominal: Soft.  Musculoskeletal:       Right foot: There is decreased range of motion, tenderness and bony tenderness. There is no swelling, normal capillary refill, no crepitus, no deformity and no laceration.  Patient has tenderness across the midfoot, mild amount of ecchymosis. Achilles tendon is intact. She has full range of motion of ankle joint in all 5 toes. Cap refills less than 2 seconds in all 5 toes and her sensation is intact.  Neurological: She is alert.  Skin: Skin is warm and dry.  Nursing note and vitals reviewed.   ED Course  Procedures (including critical care  time) Labs Review Labs Reviewed - No data to display  Imaging Review Dg Ankle Complete Right  07/20/2015  CLINICAL DATA:  Status post fall down steps, with twisting injury to the right foot and ankle. Anterior and lateral ankle and foot swelling. Initial encounter. EXAM: RIGHT ANKLE - COMPLETE 3+ VIEW COMPARISON:  None. FINDINGS: There is no evidence of fracture or dislocation. The ankle mortise is intact; the interosseous space is within normal limits. No talar tilt or subluxation is seen. The joint spaces are preserved. Lateral soft tissue swelling is noted. IMPRESSION: No evidence of fracture or dislocation. Electronically Signed   By: Garald Balding M.D.   On: 07/20/2015 00:41   Dg Foot Complete Right  07/20/2015  CLINICAL DATA:  Initial evaluation for acute trauma, fall. EXAM: RIGHT FOOT COMPLETE - 3+ VIEW COMPARISON:  None. FINDINGS: There is question of a faint linear lucency through the base of the second metatarsal, which may reflect an acute nondisplaced fracture. This is not entirely certain, and may reflect overlapping osseous shadows. No other fracture or dislocation. There is no evidence of arthropathy or other focal bone abnormality. Soft tissue swelling at the dorsal midfoot. IMPRESSION: 1. Question linear lucency at the base of the second metatarsal, which may reflect an acute nondisplaced fracture versus artifact from overlapping osseous shadows. Correlation with site of pain recommended. 2. No other acute fracture or dislocation. 3. Soft tissue swelling at the dorsal midfoot. Electronically Signed   By: Jeannine Boga M.D.   On: 07/20/2015 00:46   I have personally reviewed and evaluated these images and lab results as part of my medical decision-making.   EKG Interpretation None      MDM   Final diagnoses:  Foot sprain, right, initial encounter    Pt seen during downtime.   Questionable fracture to the base of the second metatarsal. Pt placed in Cam Walker boot and  xrays. Referred to Ortho. Rx for Ultram. Pt declined pain medication in the ED.   Pt advised to follow up with orthopedics. Conservative therapy recommended and discussed. Patient will be discharged home & is agreeable with above plan. Returns precautions discussed. Pt appears safe for discharge.    Delos Haring, PA-C 07/21/15 0000  Merryl Hacker, MD 07/21/15 [phone]

## 2015-07-20 NOTE — ED Notes (Addendum)
Please refer to downtime paperwork from 0030-0200.

## 2015-07-20 NOTE — ED Notes (Signed)
Discharge instructions, follow up care, and rx x1 reviewed with patient. Patient verbalized understanding. 

## 2015-09-29 ENCOUNTER — Ambulatory Visit: Payer: 59 | Admitting: Internal Medicine

## 2015-12-01 ENCOUNTER — Other Ambulatory Visit: Payer: Self-pay | Admitting: Internal Medicine

## 2016-09-26 ENCOUNTER — Other Ambulatory Visit: Payer: Self-pay | Admitting: Podiatry

## 2016-09-26 DIAGNOSIS — M25571 Pain in right ankle and joints of right foot: Secondary | ICD-10-CM

## 2016-09-26 DIAGNOSIS — M65871 Other synovitis and tenosynovitis, right ankle and foot: Secondary | ICD-10-CM

## 2016-10-07 ENCOUNTER — Ambulatory Visit
Admission: RE | Admit: 2016-10-07 | Discharge: 2016-10-07 | Disposition: A | Discharge status: Home / Self Care | Payer: 59 | Source: Ambulatory Visit | Attending: Podiatry | Admitting: Podiatry

## 2016-10-07 DIAGNOSIS — M25571 Pain in right ankle and joints of right foot: Secondary | ICD-10-CM

## 2016-10-07 DIAGNOSIS — M65871 Other synovitis and tenosynovitis, right ankle and foot: Secondary | ICD-10-CM

## 2016-11-06 ENCOUNTER — Other Ambulatory Visit: Payer: Self-pay | Admitting: Obstetrics and Gynecology

## 2016-11-06 DIAGNOSIS — R928 Other abnormal and inconclusive findings on diagnostic imaging of breast: Secondary | ICD-10-CM

## 2016-11-09 ENCOUNTER — Ambulatory Visit
Admission: RE | Admit: 2016-11-09 | Discharge: 2016-11-09 | Disposition: A | Discharge status: Home / Self Care | Payer: 59 | Source: Ambulatory Visit | Attending: Obstetrics and Gynecology | Admitting: Obstetrics and Gynecology

## 2016-11-09 ENCOUNTER — Other Ambulatory Visit: Payer: Self-pay | Admitting: Obstetrics and Gynecology

## 2016-11-09 DIAGNOSIS — R928 Other abnormal and inconclusive findings on diagnostic imaging of breast: Secondary | ICD-10-CM

## 2017-01-29 ENCOUNTER — Encounter (HOSPITAL_COMMUNITY): Payer: Self-pay | Admitting: Emergency Medicine

## 2017-01-29 ENCOUNTER — Emergency Department (HOSPITAL_COMMUNITY)
Admission: EM | Admit: 2017-01-29 | Discharge: 2017-01-29 | Disposition: A | Discharge status: Home / Self Care | Payer: 59 | Attending: Emergency Medicine | Admitting: Emergency Medicine

## 2017-01-29 DIAGNOSIS — Z5321 Procedure and treatment not carried out due to patient leaving prior to being seen by health care provider: Secondary | ICD-10-CM | POA: Insufficient documentation

## 2017-01-29 DIAGNOSIS — R739 Hyperglycemia, unspecified: Secondary | ICD-10-CM | POA: Diagnosis not present

## 2017-01-29 LAB — URINALYSIS, ROUTINE W REFLEX MICROSCOPIC
Bilirubin Urine: NEGATIVE
Glucose, UA: >=500 mg/dL — AB
HGB URINE DIPSTICK: NEGATIVE
Ketones, ur: NEGATIVE mg/dL
LEUKOCYTES UA: NEGATIVE
Nitrite: NEGATIVE
PH: 6 (ref 5.0–8.0)
Protein, ur: NEGATIVE mg/dL
Specific Gravity, Urine: 1.014 (ref 1.005–1.030)

## 2017-01-29 LAB — BASIC METABOLIC PANEL
Anion gap: 10 (ref 5–15)
BUN: 17 mg/dL (ref 6–20)
CALCIUM: 9.1 mg/dL (ref 8.9–10.3)
CO2: 24 mmol/L (ref 22–32)
Chloride: 104 mmol/L (ref 101–111)
Creatinine, Ser: 0.74 mg/dL (ref 0.44–1.00)
GFR calc Af Amer: >60 mL/min (ref >60)
GFR calc non Af Amer: >60 mL/min (ref >60)
Glucose, Bld: 247 mg/dL — ABNORMAL HIGH (ref 65–99)
Potassium: 3.8 mmol/L (ref 3.5–5.1)
SODIUM: 138 mmol/L (ref 135–145)

## 2017-01-29 LAB — CBC
HCT: 38.2 % (ref 36.0–46.0)
Hemoglobin: 12.9 g/dL (ref 12.0–15.0)
MCH: 28.4 pg (ref 26.0–34.0)
MCHC: 33.8 g/dL (ref 30.0–36.0)
MCV: 84.1 fL (ref 78.0–100.0)
PLATELETS: 376 10*3/uL (ref 150–400)
RBC: 4.54 MIL/uL (ref 3.87–5.11)
RDW: 13.6 % (ref 11.5–15.5)
WBC: 18.7 10*3/uL — AB (ref 4.0–10.5)

## 2017-01-29 LAB — CBG MONITORING, ED: Glucose-Capillary: 226 mg/dL — ABNORMAL HIGH (ref 65–99)

## 2017-01-29 NOTE — ED Triage Notes (Signed)
Patient reports that she had steroid injection in her back on Thursday and since Friday her blood sugar has been running in 300-500s.  Patient has ketone strips and checked urine yesterday and was positive for ketones. Patient reports polyuria.

## 2017-03-14 ENCOUNTER — Other Ambulatory Visit: Payer: Self-pay | Admitting: Specialist

## 2017-03-14 DIAGNOSIS — M5417 Radiculopathy, lumbosacral region: Secondary | ICD-10-CM

## 2017-03-22 ENCOUNTER — Ambulatory Visit
Admission: RE | Admit: 2017-03-22 | Discharge: 2017-03-22 | Disposition: A | Discharge status: Home / Self Care | Payer: 59 | Source: Ambulatory Visit | Attending: Specialist | Admitting: Specialist

## 2017-03-22 DIAGNOSIS — M5417 Radiculopathy, lumbosacral region: Secondary | ICD-10-CM

## 2018-01-14 DIAGNOSIS — F418 Other specified anxiety disorders: Secondary | ICD-10-CM | POA: Diagnosis not present

## 2018-01-28 DIAGNOSIS — D649 Anemia, unspecified: Secondary | ICD-10-CM | POA: Diagnosis not present

## 2018-02-03 DIAGNOSIS — F418 Other specified anxiety disorders: Secondary | ICD-10-CM | POA: Diagnosis not present

## 2018-02-04 DIAGNOSIS — H9203 Otalgia, bilateral: Secondary | ICD-10-CM | POA: Diagnosis not present

## 2018-02-04 DIAGNOSIS — M722 Plantar fascial fibromatosis: Secondary | ICD-10-CM | POA: Diagnosis not present

## 2018-02-04 DIAGNOSIS — H6593 Unspecified nonsuppurative otitis media, bilateral: Secondary | ICD-10-CM | POA: Diagnosis not present

## 2018-02-04 DIAGNOSIS — Z6829 Body mass index (BMI) 29.0-29.9, adult: Secondary | ICD-10-CM | POA: Diagnosis not present

## 2018-02-04 DIAGNOSIS — R0683 Snoring: Secondary | ICD-10-CM | POA: Diagnosis not present

## 2018-02-13 DIAGNOSIS — H40053 Ocular hypertension, bilateral: Secondary | ICD-10-CM | POA: Diagnosis not present

## 2018-02-13 DIAGNOSIS — H2513 Age-related nuclear cataract, bilateral: Secondary | ICD-10-CM | POA: Diagnosis not present

## 2018-02-13 DIAGNOSIS — H05243 Constant exophthalmos, bilateral: Secondary | ICD-10-CM | POA: Diagnosis not present

## 2018-02-13 DIAGNOSIS — H16223 Keratoconjunctivitis sicca, not specified as Sjogren's, bilateral: Secondary | ICD-10-CM | POA: Diagnosis not present

## 2018-02-17 ENCOUNTER — Other Ambulatory Visit: Payer: Self-pay | Admitting: Podiatry

## 2018-02-17 ENCOUNTER — Ambulatory Visit: Payer: Medicare HMO | Admitting: Podiatry

## 2018-02-17 ENCOUNTER — Encounter: Payer: Self-pay | Admitting: Podiatry

## 2018-02-17 ENCOUNTER — Ambulatory Visit (INDEPENDENT_AMBULATORY_CARE_PROVIDER_SITE_OTHER): Payer: Medicare HMO

## 2018-02-17 VITALS — BP 134/73 | HR 77 | Resp 16

## 2018-02-17 DIAGNOSIS — M84374A Stress fracture, right foot, initial encounter for fracture: Secondary | ICD-10-CM

## 2018-02-17 DIAGNOSIS — M79671 Pain in right foot: Secondary | ICD-10-CM

## 2018-02-17 NOTE — Progress Notes (Signed)
   Subjective:    Patient ID: Ashley [name], female    DOB: [birth year], 52 y.o.   MRN: [id number]  HPI    Review of Systems  All other systems reviewed and are negative.      Objective:   Physical Exam        Assessment & Plan:

## 2018-02-17 NOTE — Patient Instructions (Signed)

## 2018-02-18 DIAGNOSIS — Z4681 Encounter for fitting and adjustment of insulin pump: Secondary | ICD-10-CM | POA: Diagnosis not present

## 2018-02-18 DIAGNOSIS — Z6828 Body mass index (BMI) 28.0-28.9, adult: Secondary | ICD-10-CM | POA: Diagnosis not present

## 2018-02-18 DIAGNOSIS — Z23 Encounter for immunization: Secondary | ICD-10-CM | POA: Diagnosis not present

## 2018-02-18 DIAGNOSIS — I10 Essential (primary) hypertension: Secondary | ICD-10-CM | POA: Diagnosis not present

## 2018-02-18 DIAGNOSIS — M810 Age-related osteoporosis without current pathological fracture: Secondary | ICD-10-CM | POA: Diagnosis not present

## 2018-02-18 DIAGNOSIS — Z794 Long term (current) use of insulin: Secondary | ICD-10-CM | POA: Diagnosis not present

## 2018-02-18 DIAGNOSIS — E1040 Type 1 diabetes mellitus with diabetic neuropathy, unspecified: Secondary | ICD-10-CM | POA: Diagnosis not present

## 2018-02-18 NOTE — Progress Notes (Signed)
Subjective:   Patient ID: Ashley [name], female   DOB: 52 y.o.   MRN: [id number]   HPI Patient presents stating the heel has been really bothering her and its been hurting for at least a month and she does not remember specific injury.  States that it is worse when she gets up in the morning and after periods of sitting and she smokes half pack per day and likes to be active   Review of Systems  All other systems reviewed and are negative.       Objective:  Physical Exam  Constitutional: She appears well-developed and well-nourished.  Cardiovascular: Intact distal pulses.  Pulmonary/Chest: Effort normal.  Musculoskeletal: Normal range of motion.  Neurological: She is alert.  Skin: Skin is warm.  Nursing note and vitals reviewed.   Neurovascular status intact muscle strength is adequate range of motion within normal limits with patient found to have exquisite discomfort plantar aspect right heel with the insertional point of the tendon into the calcaneus with inflammation fluid around the medial band and moderate depression of the arch     Assessment:  Acute plantar fasciitis right with inflammation fluid of the medial side of the heel at its attachment calcaneus     Plan:  H&P x-ray and condition reviewed and today I injected the plantar fascia 3 mg Kenalog 5 mg Xylocaine applied fascial brace gave instructions for physical therapy and reappoint for [place] to recheck along with shoe gear modification  X-ray indicates there is small spur with no indication of stress fracture arthritis

## 2018-02-19 ENCOUNTER — Telehealth: Payer: Self-pay | Admitting: *Deleted

## 2018-02-19 NOTE — Telephone Encounter (Signed)
Pt states the boot she was given by at her visit with Dr. Paulla Dolly may be the wrong size, it is digging in her leg, very painful and has an indention in the bone area when removed.

## 2018-02-19 NOTE — Telephone Encounter (Signed)
I told pt to remain in the boot until she could come in tomorrow to be refitted, to decrease activity to only ADL, have the boot on to stabilize the heel, but could take off when resting. Pt states understanding.

## 2018-02-21 ENCOUNTER — Other Ambulatory Visit: Payer: Self-pay | Admitting: Ophthalmology

## 2018-02-21 DIAGNOSIS — H05243 Constant exophthalmos, bilateral: Secondary | ICD-10-CM

## 2018-02-28 ENCOUNTER — Ambulatory Visit
Admission: RE | Admit: 2018-02-28 | Discharge: 2018-02-28 | Disposition: A | Discharge status: Home / Self Care | Payer: 59 | Source: Ambulatory Visit | Attending: Ophthalmology | Admitting: Ophthalmology

## 2018-02-28 DIAGNOSIS — H05243 Constant exophthalmos, bilateral: Secondary | ICD-10-CM

## 2018-02-28 MED ORDER — IOPAMIDOL (ISOVUE-300) INJECTION 61%
75.0000 mL | Freq: Once | INTRAVENOUS | Status: AC | PRN
  Administered 2018-02-28: 75 mL via INTRAVENOUS

## 2018-03-04 DIAGNOSIS — F418 Other specified anxiety disorders: Secondary | ICD-10-CM | POA: Diagnosis not present

## 2018-03-06 DIAGNOSIS — G5603 Carpal tunnel syndrome, bilateral upper limbs: Secondary | ICD-10-CM | POA: Diagnosis not present

## 2018-03-06 DIAGNOSIS — M545 Low back pain: Secondary | ICD-10-CM | POA: Diagnosis not present

## 2018-03-06 DIAGNOSIS — Z79899 Other long term (current) drug therapy: Secondary | ICD-10-CM | POA: Diagnosis not present

## 2018-03-06 DIAGNOSIS — M5412 Radiculopathy, cervical region: Secondary | ICD-10-CM | POA: Diagnosis not present

## 2018-03-06 DIAGNOSIS — G603 Idiopathic progressive neuropathy: Secondary | ICD-10-CM | POA: Diagnosis not present

## 2018-03-06 DIAGNOSIS — R202 Paresthesia of skin: Secondary | ICD-10-CM | POA: Diagnosis not present

## 2018-03-10 ENCOUNTER — Encounter: Payer: Self-pay | Admitting: Podiatry

## 2018-03-10 ENCOUNTER — Ambulatory Visit: Payer: Medicare HMO | Admitting: Podiatry

## 2018-03-10 DIAGNOSIS — M722 Plantar fascial fibromatosis: Secondary | ICD-10-CM | POA: Diagnosis not present

## 2018-03-10 MED ORDER — TRIAMCINOLONE ACETONIDE 10 MG/ML IJ SUSP
10.0000 mg | Freq: Once | INTRAMUSCULAR | Status: AC
  Administered 2018-03-10: 10 mg

## 2018-03-10 NOTE — Progress Notes (Signed)
Subjective:   Patient ID: Ashley [name], female   DOB: 52 y.o.   MRN: [id number]   HPI Patient states she is still getting pain in her right heel with some improvement but is worse when she gets up in the morning after sitting and is hard for her to wear the boot all day   ROS      Objective:  Physical Exam  Neurovascular status intact with exquisite discomfort plantar aspect right heel at the insertion calcaneus with pain mostly related to the progress of the fascia itself     Assessment:  Acute coronary pressure was still present right despite immobilization previous injection     Plan:  Injected the right plantar fascia 3 mg Kenalog 5 mg Xylocaine and dispensed night splint with all instructions on usage.  Reappoint for [place] to recheck again as needed over the few weeks

## 2018-03-28 DIAGNOSIS — F418 Other specified anxiety disorders: Secondary | ICD-10-CM | POA: Diagnosis not present

## 2018-03-31 ENCOUNTER — Encounter: Payer: Self-pay | Admitting: Neurology

## 2018-03-31 ENCOUNTER — Encounter: Payer: Self-pay | Admitting: Podiatry

## 2018-03-31 ENCOUNTER — Ambulatory Visit: Payer: Medicare HMO | Admitting: Podiatry

## 2018-03-31 DIAGNOSIS — M722 Plantar fascial fibromatosis: Secondary | ICD-10-CM | POA: Diagnosis not present

## 2018-04-02 ENCOUNTER — Encounter: Payer: Self-pay | Admitting: Neurology

## 2018-04-02 ENCOUNTER — Ambulatory Visit: Payer: PRIVATE HEALTH INSURANCE | Admitting: Neurology

## 2018-04-02 VITALS — BP 130/74 | HR 100 | Ht 59.5 in | Wt 153.2 lb

## 2018-04-02 DIAGNOSIS — R0683 Snoring: Secondary | ICD-10-CM

## 2018-04-02 DIAGNOSIS — J4541 Moderate persistent asthma with (acute) exacerbation: Secondary | ICD-10-CM

## 2018-04-02 DIAGNOSIS — J449 Chronic obstructive pulmonary disease, unspecified: Secondary | ICD-10-CM | POA: Diagnosis not present

## 2018-04-02 DIAGNOSIS — J4540 Moderate persistent asthma, uncomplicated: Secondary | ICD-10-CM | POA: Insufficient documentation

## 2018-04-02 DIAGNOSIS — G4719 Other hypersomnia: Secondary | ICD-10-CM | POA: Diagnosis not present

## 2018-04-02 DIAGNOSIS — G4733 Obstructive sleep apnea (adult) (pediatric): Secondary | ICD-10-CM | POA: Diagnosis not present

## 2018-04-02 DIAGNOSIS — R0689 Other abnormalities of breathing: Secondary | ICD-10-CM

## 2018-04-02 DIAGNOSIS — K3184 Gastroparesis: Secondary | ICD-10-CM

## 2018-04-02 DIAGNOSIS — G4721 Circadian rhythm sleep disorder, delayed sleep phase type: Secondary | ICD-10-CM | POA: Insufficient documentation

## 2018-04-02 DIAGNOSIS — G4730 Sleep apnea, unspecified: Secondary | ICD-10-CM | POA: Diagnosis not present

## 2018-04-02 DIAGNOSIS — R0600 Dyspnea, unspecified: Secondary | ICD-10-CM

## 2018-04-02 NOTE — Progress Notes (Signed)
SLEEP MEDICINE CLINIC   Provider:  Larey Seat, M D  Primary Care Physician:  Prince Solian, MD   Referring Provider: Prince Solian, MD    Chief Complaint  Patient presents with  . New Patient (Initial Visit)    Rm 11, alone  . Referred by Dr. Dagmar Hait    Snoring,witness apnea, no sleep study.     HPI:  Ashley [name] is a 52 y.o. female caucasian, left handed patient, seen on 04-02-2018 in a referral from Dr. Dagmar Hait for a sleep evaluation.   Chief complaint according to patient : " my poor sleep is interfering with my marriage " ; "My mind is racing and I go to bed at 2 AM, rise at 6 am"  " I am so very sleepy and fatigued, and I fall asleep at my kitchen table " .  Her spouse has told her that she has apnea at night that she snores loudly at night and she is excessively daytime sleepy.  Recently she went to a beach trip with her sister who also said the patient stop breathing while sleeping.  She would like to have a sleep study.   Sleep habits are as follows: she eats her last meal just before sleep at 1 AM- a microwaved frozen dinner- she cooks the meal for her family at 5-6 pm, and that's a meat and potato dinner. She just came into the habit after losing her job. She does all the housecleaning between 6 pm and midnight. She is wound up, not feeling ready to rest.  Bedroom shared with her husband, who watches TV in bed. She is asleep within minutes and stays asleep until husbands alarm rings at 6.30am. She doesn't eat breakfast, drinks coffee- and stays at home, she feels not like doing anything, blue.  Her husband reports her to snore loudly and she stops to breath, her mouth is dry, her throat raw. She averages 4-5 hours of sleep, and takes unscheduled naps of 1 hour when not active - and active she is only at night time, after dark- when her mind starts racing.    Sleep medical history and family sleep history: no family history of OSA . Personal history of obesity, DM  dx at age 53 , HTN, depression, fatigue, tearful in daytime  and hypomania in the evening.  She has chronic right heel pain she fractured her tarsals, sitting and resting actually relieves her of the pain.  Has bilateral ear pain, has a sore throat coughs at night,  Coughing also interrupt sleep, has been using Delsym is decongestant, is taking Xyzal daily and has seen Dr. Festus Holts for sinus surgery.  She has frequent bronchitis- asthma /COPD.  Social history: She  lost her work place due to disability after 32 years with the company at [employer]. Since she broke her foot in late 2017 she felt it never healed well, also due to diabetes. She is in neurological treatment and gets shots in her back (?).  She lost her mother to lung and liver cancer in 2017, too. She became severely depressed.  Son is 48. Married, unemployed/ disabled. Smoker- 1/2 ppd- ETOH none, caffeine - 1 cup of coffee in AM, drinks Dr Samson Frederic, 3-4 glasses a day, all in PM. 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   Review of Systems: Out of a complete 14 system review, the patient complains of only the following symptoms, and all other  reviewed systems are negative.  Epworth score 18/ 24 - extremely high  , Fatigue severity score 62/ 63 points , depression score  8/ 15 - also clinically significant ,     Social History   Socioeconomic History  . Marital status: Married    Spouse name: Not on file  . Number of children: Not on file  . Years of education: Not on file  . Highest education level: Not on file  Occupational History  . Occupation: Occupational psychologist  Social Needs  . Financial resource strain: Not on file  . Food insecurity:    Worry: Not on file    Inability: Not on file  . Transportation needs:    Medical: Not on file    Non-medical: Not on file  Tobacco Use  . Smoking status: Current Some Day Smoker    Packs/day: 0.50    Years: 25.00    Pack years: 12.50    Types:  Cigarettes  . Smokeless tobacco: Never Used  . Tobacco comment: 6-8 CIGS A DAY  Substance and Sexual Activity  . Alcohol use: No    Alcohol/week: 0.0 standard drinks  . Drug use: No  . Sexual activity: Not on file  Lifestyle  . Physical activity:    Days per week: Not on file    Minutes per session: Not on file  . Stress: Not on file  Relationships  . Social connections:    Talks on phone: Not on file    Gets together: Not on file    Attends religious service: Not on file    Active member of club or organization: Not on file    Attends meetings of clubs or organizations: Not on file    Relationship status: Not on file  . Intimate partner violence:    Fear of current or ex partner: Not on file    Emotionally abused: Not on file    Physically abused: Not on file    Forced sexual activity: Not on file  Other Topics Concern  . Not on file  Social History Narrative   Lives home with husband.  Education 12th grade.  Caffeine 4-5 drinks daily.    Family History  Problem Relation Age of Onset  . Diabetes Father   . Heart attack Father   . Alcoholism Father   . Cancer Mother   . Colon cancer Neg Hx   . Esophageal cancer Neg Hx   . Rectal cancer Neg Hx   . Stomach cancer Neg Hx     Past Medical History:  Diagnosis Date  . Anxiety   . Depression   . Diabetes mellitus (No Name)   . Esophageal stricture   . Gastroparesis   . GERD (gastroesophageal reflux disease)   . Graves disease 02/2010   diagnosed by radioactive iodine uptake and scan   . Helicobacter pylori infection   . Hyperlipidemia   . Hypertension   . Insulin pump in place   . Nephrolithiasis 2010   rt sided per GI  . Neuropathy    diabetic neuropathy  . Osteoporosis   . Vitamin D deficiency     Past Surgical History:  Procedure Laterality Date  . BREAST EXCISIONAL BIOPSY    . BREAST LUMPECTOMY     LEFT-BENIGN  . NASAL SEPTOPLASTY W/ TURBINOPLASTY Bilateral 09/07/2013   Procedure: SEPTOPLASTY, BILATERAL  TURBINATE RESECTION ;  Surgeon: Ascencion Dike, MD;  Location: [facility];  Service: ENT;  Laterality: Bilateral;  . UPPER  GASTROINTESTINAL ENDOSCOPY    . VAGINAL HYSTERECTOMY  2006    Current Outpatient Medications  Medication Sig Dispense Refill  . ACCU-CHEK COMPACT PLUS test strip 1 each by Other route as needed.     Marland Kitchen albuterol (PROVENTIL) (2.5 MG/3ML) 0.083% nebulizer solution Use every 2 hours as needed for severe wheezing for the next 3 days then decrease to every 4-6 hours  3  . ALPRAZolam (XANAX) 0.5 MG tablet Take 0.5 mg by mouth 3 (three) times daily as needed.     . AMBULATORY NON FORMULARY MEDICATION NOVOLOG INSULIN PUMP USES AS DIRECTED    . amLODipine (NORVASC) 5 MG tablet Take 5 mg by mouth daily.  3  . Azelastine HCl 0.15 % SOLN Place 1 spray into the nose daily.    . cyanocobalamin (,VITAMIN B-12,) 1000 MCG/ML injection INJECT 1 MILLILITER INTO THE MUSCLE ONCE A WEEK FOR 4 WEEKS THEN 1 MILLILITER ONCE A MONTH THERAFTER  4  . DULoxetine (CYMBALTA) 30 MG capsule TAKE ONE CAPSULE DAILY IN ADDITION TO 60 MG IN THE MORNING  1  . estradiol (CLIMARA - DOSED IN MG/24 HR) 0.05 mg/24hr patch Place 0.05 mg onto the skin. Twice weekly    . fluticasone (FLONASE) 50 MCG/ACT nasal spray Place 1 spray into the nose daily.     Marland Kitchen gabapentin (NEURONTIN) 300 MG capsule Take 1 capsule by mouth at bedtime.     . lansoprazole (PREVACID) 30 MG capsule Take 30 mg by mouth daily.  2  . latanoprost (XALATAN) 0.005 % ophthalmic solution Place 1 drop into both eyes at bedtime.  11  . levocetirizine (XYZAL) 5 MG tablet Take 5 mg by mouth every evening.    Marland Kitchen levothyroxine (SYNTHROID, LEVOTHROID) 112 MCG tablet TAKE 1 TABLET BY MOUTH EVERY DAY ON AN EMPTY STOMACH WITH GLASS OF WATER  0  . losartan (COZAAR) 100 MG tablet Take 100 mg by mouth daily.    . methocarbamol (ROBAXIN) 750 MG tablet Take 750 mg by mouth daily as needed.   0  . metoprolol succinate (TOPROL-XL) 50 MG 24 hr tablet One  tablet by mouth once daily    . MINIVELLE 0.025 MG/24HR Apply 1 patch twice weekly as directed  3  . montelukast (SINGULAIR) 10 MG tablet Take 10 mg by mouth at bedtime.    . ondansetron (ZOFRAN) 4 MG tablet Take 1 tablet (4 mg total) by mouth 3 (three) times daily. (Patient taking differently: Take 4 mg by mouth 3 (three) times daily as needed. ) 90 tablet 3  . pravastatin (PRAVACHOL) 20 MG tablet Take 1 tablet by mouth at bedtime.    Marland Kitchen PROAIR HFA 108 (90 BASE) MCG/ACT inhaler Inhale 2 puffs into the lungs every 4 (four) hours as needed.  11  . promethazine (PHENERGAN) 25 MG tablet Take 1 tablet by mouth. Every 4-6 hours as needed    . ranitidine (ZANTAC) 300 MG tablet TAKE 1 TABLET BY MOUTH EVERYDAY AT BEDTIME  3  . Vitamin D, Ergocalciferol, (DRISDOL) 50000 UNITS CAPS Take 50,000 Units by mouth every 7 (seven) days.     No current facility-administered medications for this visit.     Allergies as of 04/02/2018 - Review Complete 04/02/2018  Allergen Reaction Noted  . Clindamycin Hives 02/28/2009  . Other Hives 02/17/2018  . Sulfonamide derivatives Hives   . Lipitor [atorvastatin] Other (See Comments)   . Restasis [cyclosporine]  02/17/2018    Vitals: BP 130/74   Pulse 100   Ht 4' 11.5" (  1.511 m)   Wt 153 lb 3.2 oz (69.5 kg)   BMI 30.42 kg/m  Last Weight:  Wt Readings from Last 1 Encounters:  04/02/18 153 lb 3.2 oz (69.5 kg)   VWU:JWJX mass index is 30.42 kg/m.     Last Height:   Ht Readings from Last 1 Encounters:  04/02/18 4' 11.5" (1.511 m)    Physical exam:  General: The patient is awake, alert and appears not in acute distress. The patient is well groomed. Head: Normocephalic, atraumatic. Neck is supple. Mallampati 4- all reddened mucosa,  neck circumference:16" large .  Nasal airflow congested , small lower jaw. Cardiovascular:  Regular rate and rhythm *, without  murmurs or carotid bruit, and without distended neck veins. Respiratory: Lungs wheezing, rhonchi to  auscultation. Skin:  Without evidence of edema in both ankles. Trunk: BMI is 30.42 . Abdominal , truncal obesity - large neck. The patient's posture is stooped.  Neurologic exam : The patient is awake and alert, oriented to place and time.    Attention span & concentration ability appears normal.  Speech is fluent,  with dysphonia .  Mood and affect are depressed .  Cranial nerves: Pupils are equal and briskly reactive to light. Funduscopic exam was last done by Dr Katy Fitch in September.  Extraocular movements  in vertical and horizontal planes intact and without nystagmus.  Right ptosis. Visual fields by finger perimetry are intact. Hearing to finger rub intact.  Facial sensation intact to fine touch.  Facial motor strength is symmetric and tongue and uvula move midline. Shoulder shrug was symmetrical.   Motor exam:   Normal tone, muscle bulk and symmetric strength in all extremities. I deferred the ankle ROM.   Sensory:  Fine touch, pinprick and vibration were reduced in both feet and both ankles.Proprioception tested in the upper extremities was normal.  Coordination: Rapid alternating movements in the fingers/hands was non fluent- abrupt movements, " overshooting " . Finger-to-nose maneuver with evidence of dysmetria and coarse  tremor.  Gait and station: Patient walks without assistive device Deep tendon reflexes: deferred - she is reportedly in neurological treatment.     Assessment:  After physical and neurologic examination, review of laboratory studies,  Personal review of imaging studies, reports of other /same  Imaging studies, results of polysomnography and / or neurophysiology testing and pre-existing records as far as provided in visit., my assessment is   1)  Insomnia due to poor sleep hygiene, delayed sleep phase, caffeine abuse in Pm and inability to handle her ' racing mind" -  Anxiety, depression likely underlying causes.   2) OSA _ snoring and witnessed apnea- she has a  high risk for having obstructive sleep apnea based on her build not her overall body mass index.  The patient is obese but not morbidly obese by BMI, however her obesity is centered around the trunk chest and neck.  She has rather slender extremities.  She has been witnessed to have apnea and snore loudly, she is waking up partially due to coughing partially due to snoring.   3) she is at high risk of having hypercapnia, hypoxemia during sleep given her long-standing smoking history, Dr. Dagmar Hait has suspected a reactive airway disease,she has chronic wheezing and rhonchi.  Part of treatment here would be active smoking cessation may be was a medically guided program, may be using Chantix or Wellbutrin.   The patient was advised of the nature of the diagnosed disorder , the treatment options and the  risks for general health and wellness arising from not treating the condition.   I spent more than 50 minutes of face to face time with the patient.  Greater than 50% of time was spent in counseling and coordination of care. We have discussed the diagnosis and differential and I answered the patient's questions.    Plan:  Treatment plan and additional workup :  I ordered an attended sleep study with SPLIT at AHI 20, and with hypoxemia and hypercapnia measures I like to obtain the  peak CO2 in torr during NREM and REM sleep, can we get total time at or above 50 torr, please.   PCP to refer to depression treatment , Insomnia treatment - behavioral therapy which may help with smoking cessation as well. I gave her the 14 day boot-camp for insomniacs.          No follow-ups on file.   Larey Seat, MD 40/81/4481, 85:63 AM  Certified in Neurology by ABPN Certified in [place] by [facility] [address], [place], [place] [address]

## 2018-04-02 NOTE — Progress Notes (Signed)
Subjective:   Patient ID: Ashley [name], female   DOB: 52 y.o.   MRN: [id number]   HPI Patient states that she is improved with her heel with discomfort if she does too much walking but overall continuing to improve   ROS      Objective:  Physical Exam  Neurovascular status intact with patient's right heel improved with pain still noted upon deep palpation but overall quite a bit improved from previous visit     Assessment:  Acute plantar fasciitis right improving with mild discomfort noted     Plan:  Continue with physical therapy anti-inflammatory supportive shoes and ice therapy.  If symptoms were to persist or recur patient will be seen back and may require more advanced type treatments

## 2018-04-14 DIAGNOSIS — E109 Type 1 diabetes mellitus without complications: Secondary | ICD-10-CM | POA: Diagnosis not present

## 2018-04-17 DIAGNOSIS — E109 Type 1 diabetes mellitus without complications: Secondary | ICD-10-CM | POA: Diagnosis not present

## 2018-05-02 DIAGNOSIS — I10 Essential (primary) hypertension: Secondary | ICD-10-CM | POA: Diagnosis not present

## 2018-05-02 DIAGNOSIS — F418 Other specified anxiety disorders: Secondary | ICD-10-CM | POA: Diagnosis not present

## 2018-05-02 DIAGNOSIS — E0500 Thyrotoxicosis with diffuse goiter without thyrotoxic crisis or storm: Secondary | ICD-10-CM | POA: Diagnosis not present

## 2018-05-02 DIAGNOSIS — E1040 Type 1 diabetes mellitus with diabetic neuropathy, unspecified: Secondary | ICD-10-CM | POA: Diagnosis not present

## 2018-05-02 DIAGNOSIS — K219 Gastro-esophageal reflux disease without esophagitis: Secondary | ICD-10-CM | POA: Diagnosis not present

## 2018-05-02 DIAGNOSIS — Z6828 Body mass index (BMI) 28.0-28.9, adult: Secondary | ICD-10-CM | POA: Diagnosis not present

## 2018-05-02 DIAGNOSIS — J45909 Unspecified asthma, uncomplicated: Secondary | ICD-10-CM | POA: Diagnosis not present

## 2018-05-12 ENCOUNTER — Ambulatory Visit (INDEPENDENT_AMBULATORY_CARE_PROVIDER_SITE_OTHER): Payer: Medicare HMO | Admitting: Neurology

## 2018-05-12 DIAGNOSIS — J449 Chronic obstructive pulmonary disease, unspecified: Secondary | ICD-10-CM

## 2018-05-12 DIAGNOSIS — R0689 Other abnormalities of breathing: Secondary | ICD-10-CM

## 2018-05-12 DIAGNOSIS — G4733 Obstructive sleep apnea (adult) (pediatric): Secondary | ICD-10-CM | POA: Diagnosis not present

## 2018-05-12 DIAGNOSIS — G4721 Circadian rhythm sleep disorder, delayed sleep phase type: Secondary | ICD-10-CM

## 2018-05-12 DIAGNOSIS — K3184 Gastroparesis: Secondary | ICD-10-CM

## 2018-05-12 DIAGNOSIS — R0683 Snoring: Secondary | ICD-10-CM

## 2018-05-12 DIAGNOSIS — J4541 Moderate persistent asthma with (acute) exacerbation: Secondary | ICD-10-CM

## 2018-05-12 DIAGNOSIS — R0600 Dyspnea, unspecified: Secondary | ICD-10-CM

## 2018-05-12 DIAGNOSIS — G4719 Other hypersomnia: Secondary | ICD-10-CM

## 2018-05-12 DIAGNOSIS — G4730 Sleep apnea, unspecified: Secondary | ICD-10-CM

## 2018-05-19 DIAGNOSIS — R0683 Snoring: Secondary | ICD-10-CM | POA: Insufficient documentation

## 2018-05-19 NOTE — Addendum Note (Signed)
Addended by: Larey Seat on: 05/19/2018 11:25 AM   Modules accepted: Orders

## 2018-05-19 NOTE — Procedures (Signed)
PATIENT'S NAME:  Ashley [name], Ashley [name] DOB:      July 08, 1965      MR#:    [id number]     DATE OF RECORDING: 05/12/2018  AL REFERRING M.D.:  Michaell Cowing, MD Study Performed:   Baseline Polysomnogram HISTORY:  Ashley [name] is a 52 y.o. female Caucasian, left handed patient, seen on 04-02-2018 in a referral from Dr. Dagmar Hait. Chief complaint according to patient: "My poor sleep is interfering with my marriage" ; "My mind is racing and I go to bed at 2 AM, rise at 6 am" and : " I am so very sleepy and fatigued, I fall asleep at my kitchen table ".   She is excessively daytime sleepy.  Recently she went to a beach trip with her sister, who also said the patient stops breathing while sleeping. Her husband reports her to snore loudly and she stops to breath, her mouth is dry, her throat raw. She averages 4-5 hours of sleep, and takes unscheduled naps of 1 hour when not active - and active she is only at night time, after dark- when her mind starts racing.  Coughing also interrupt sleep, has been taking Xyzal daily and has seen Dr. Festus Holts for sinus surgery.  She has frequent bronchitis- asthma /COPD. Smoker- 1/2 ppd- caffeine user: 1 cup of coffee in AM, drinks Dr. Samson Frederic, 3-4 glasses a day, all in PM. 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  The patient endorsed the Epworth Sleepiness Scale at 18/24 points.   The patient's weight 152 pounds with a height of 59 (inches), resulting in a BMI of 30.7 kg/m2. The patient's neck circumference measured 16 inches.  CURRENT MEDICATIONS: Proventil, Xanax, Norvasc, Cymbalta, Climara, Flonase, Neurontin, Xalatan, Xyzal, Synthroid, Cozaar, Robaxin, Toprol, Singulair, Zofran, Pravachol, Phenergan, Zantac   PROCEDURE:  This is a multichannel digital polysomnogram utilizing the Somnostar 11.2 system.  Electrodes and sensors were applied and monitored per AASM Specifications.   EEG, EOG, Chin and Limb EMG, were sampled at 200 Hz.  ECG, Snore and  Nasal Pressure, Thermal Airflow, Respiratory Effort, CPAP Flow and Pressure, Oximetry was sampled at 50 Hz. Digital video and audio were recorded.      BASELINE STUDY: Lights Out was at 23:06 and Lights On at 05:00.  Total recording time (TRT) was 354.5 minutes, with a total sleep time (TST) of 321.5 minutes.   The patient's sleep latency was 13 minutes.  REM latency was 229.5 minutes.  The sleep efficiency was 90.7 %.     SLEEP ARCHITECTURE: WASO (Wake after sleep onset) was 23 minutes.  There were 24 minutes in Stage N1, 86 minutes Stage N2, 206.5 minutes Stage N3 and 5 minutes in Stage REM.  The percentage of Stage N1 was 7.5%, Stage N2 was 26.7%, Stage N3 was 64.2% and Stage R (REM sleep) was 1.6%.   RESPIRATORY ANALYSIS:  There were a total of 117 respiratory events:  6 obstructive apneas, 3 central apneas and 2 mixed apneas with a total of 11 apneas and an apnea index (AI) of 2.1 /hour. There were 106 hypopneas with a hypopnea index of 19.8 /hour. The patient also had 20 additional respiratory event related arousals (RERAs).  The total APNEA/HYPOPNEA INDEX (AHI) was 21.8 /hour and the total RESPIRATORY DISTURBANCE INDEX was 25.8 /hour.  6 events occurred in REM sleep and 205 events in NREM. The REM AHI was 0. 72 /hour, versus a non-REM AHI of 21/h. The patient spent  143 minutes of total sleep time in the supine position and 179 minutes in non-supine position with a  supine AHI of 20.1/h versus a non-supine AHI of 23.2.  OXYGEN SATURATION & C02:  The Wake baseline 02 saturation was 97%, with the lowest being 78%. Time spent below 89% saturation equaled 168 minutes.    PERIODIC LIMB MOVEMENTS: The arousals were noted as: 32 were spontaneous, 2 were associated with PLMs, and 40 were associated with respiratory events.  The patient had a total of 77 Periodic Limb Movements.  The Periodic Limb Movement (PLM) index was 14.4 and the PLM Arousal index was 0.4/hour.  Audio and video analysis did not show  any abnormal or unusual movements, behaviors, phonations or vocalizations.  EEG was abnormally slow for age, EKG was normal sinus rhythm. No nocturia.   IMPRESSION:  1. Moderate complex, mostly Obstructive Sleep Apnea (OSA)at AHI 21.8/h. AHI increased after 90 minutes of sleep. 2. Loud snoring with RERAs. 3. Prolonged and severe hypoxemia, Nadir SpO2 was 78, total sleep time with low oxygen was 168 minutes  of 321 min TST.  4. Likely OSA with COPD overlap. I like for patient to return for an attended sleep study with the opportunity to add oxygen in case that CPAP would treat only apnea, but may not help the hypoxemia component.    RECOMMENDATIONS:  1. Advise full-night, attended, CPAP titration study to optimize therapy.  O2 titration may be indicated.    I certify that I have reviewed the entire raw data recording prior to the issuance of this report in accordance with the Standards of Accreditation of the American Academy of Sleep Medicine (AASM)    Larey Seat, MD    05-19-2018  Diplomat, American Board of Psychiatry and Neurology  Diplomat, American Board of [place] Director, [employer] [employer]

## 2018-05-20 ENCOUNTER — Telehealth: Payer: Self-pay | Admitting: Neurology

## 2018-05-20 ENCOUNTER — Telehealth: Payer: Self-pay

## 2018-05-20 NOTE — Telephone Encounter (Signed)
Called patient to discuss sleep study results. No answer at this time. LVM for the patient to call back.   

## 2018-05-20 NOTE — Telephone Encounter (Signed)
Patient was returning [place], [place] phone call from earlier.

## 2018-05-20 NOTE — Telephone Encounter (Signed)
-----   Message from Larey Seat, MD sent at 05/19/2018 11:25 AM EST ----- IMPRESSION:  1. Moderate complex, mostly Obstructive Sleep Apnea (OSA)at AHI  21.8/h. AHI increased after 90 minutes of sleep. 2. Loud snoring with RERAs. 3. Prolonged and severe hypoxemia, Nadir SpO2 was 78, total sleep  time with low oxygen was 168 minutes of 321 min TST.  4. Likely OSA with COPD overlap. I like for patient to return for  an attended sleep study with the opportunity to add oxygen in  case that CPAP would treat only apnea, but may not help the  hypoxemia component.    RECOMMENDATIONS:  1. Advise full-night, attended, CPAP titration study to optimize  therapy. O2 titration may be indicated.

## 2018-05-20 NOTE — Telephone Encounter (Signed)
Pt returned call. I advised pt that Dr. Brett Fairy reviewed their sleep study results and found that have sleep apnea  and recommends that pt be treated with a cpap. Dr. Brett Fairy recommends that pt return for a repeat sleep study in order to properly titrate the cpap and ensure a good mask fit. Pt is agreeable to returning for a titration study. I advised pt that our sleep lab will file with pt's insurance and call pt to schedule the sleep study when we hear back from the pt's insurance regarding coverage of this sleep study. Pt verbalized understanding of results. Pt had no questions at this time but was encouraged to call back if questions arise.

## 2018-05-23 DIAGNOSIS — J45901 Unspecified asthma with (acute) exacerbation: Secondary | ICD-10-CM | POA: Diagnosis not present

## 2018-05-23 DIAGNOSIS — J019 Acute sinusitis, unspecified: Secondary | ICD-10-CM | POA: Diagnosis not present

## 2018-05-23 DIAGNOSIS — R05 Cough: Secondary | ICD-10-CM | POA: Diagnosis not present

## 2018-05-23 DIAGNOSIS — Z6828 Body mass index (BMI) 28.0-28.9, adult: Secondary | ICD-10-CM | POA: Diagnosis not present

## 2018-05-23 DIAGNOSIS — E1040 Type 1 diabetes mellitus with diabetic neuropathy, unspecified: Secondary | ICD-10-CM | POA: Diagnosis not present

## 2018-05-26 DIAGNOSIS — F418 Other specified anxiety disorders: Secondary | ICD-10-CM | POA: Diagnosis not present

## 2018-06-10 DIAGNOSIS — E1040 Type 1 diabetes mellitus with diabetic neuropathy, unspecified: Secondary | ICD-10-CM | POA: Diagnosis not present

## 2018-06-10 DIAGNOSIS — I10 Essential (primary) hypertension: Secondary | ICD-10-CM | POA: Diagnosis not present

## 2018-06-10 DIAGNOSIS — J45901 Unspecified asthma with (acute) exacerbation: Secondary | ICD-10-CM | POA: Diagnosis not present

## 2018-06-10 DIAGNOSIS — Z794 Long term (current) use of insulin: Secondary | ICD-10-CM | POA: Diagnosis not present

## 2018-06-17 DIAGNOSIS — E109 Type 1 diabetes mellitus without complications: Secondary | ICD-10-CM | POA: Diagnosis not present

## 2018-06-17 DIAGNOSIS — H2513 Age-related nuclear cataract, bilateral: Secondary | ICD-10-CM | POA: Diagnosis not present

## 2018-06-17 DIAGNOSIS — E0500 Thyrotoxicosis with diffuse goiter without thyrotoxic crisis or storm: Secondary | ICD-10-CM | POA: Diagnosis not present

## 2018-06-17 DIAGNOSIS — H40053 Ocular hypertension, bilateral: Secondary | ICD-10-CM | POA: Diagnosis not present

## 2018-06-17 DIAGNOSIS — H05243 Constant exophthalmos, bilateral: Secondary | ICD-10-CM | POA: Diagnosis not present

## 2018-06-17 DIAGNOSIS — H16223 Keratoconjunctivitis sicca, not specified as Sjogren's, bilateral: Secondary | ICD-10-CM | POA: Diagnosis not present

## 2018-06-19 ENCOUNTER — Ambulatory Visit (INDEPENDENT_AMBULATORY_CARE_PROVIDER_SITE_OTHER): Payer: Medicare HMO | Admitting: Neurology

## 2018-06-19 DIAGNOSIS — J4541 Moderate persistent asthma with (acute) exacerbation: Secondary | ICD-10-CM

## 2018-06-19 DIAGNOSIS — G4719 Other hypersomnia: Secondary | ICD-10-CM

## 2018-06-19 DIAGNOSIS — R0683 Snoring: Secondary | ICD-10-CM

## 2018-06-19 DIAGNOSIS — G4721 Circadian rhythm sleep disorder, delayed sleep phase type: Secondary | ICD-10-CM

## 2018-06-19 DIAGNOSIS — R0600 Dyspnea, unspecified: Secondary | ICD-10-CM

## 2018-06-19 DIAGNOSIS — G4733 Obstructive sleep apnea (adult) (pediatric): Secondary | ICD-10-CM | POA: Diagnosis not present

## 2018-06-19 DIAGNOSIS — R0689 Other abnormalities of breathing: Secondary | ICD-10-CM

## 2018-06-19 DIAGNOSIS — J449 Chronic obstructive pulmonary disease, unspecified: Secondary | ICD-10-CM

## 2018-06-29 NOTE — Procedures (Signed)
PATIENT'S NAME:  Ashley, [name] DOB:      January 11, 1966      MR#:    [id number]     DATE OF RECORDING: 06/19/2018  MR REFERRING M.D.:  Prince Solian, MD Study Performed:   Titration to positive airway pressure HISTORY:  Ashley [name] is a 53 y.o. female patient and smoker, who was seen on 04-02-2018 in a referral from Dr. Dagmar Hait. The Diagnostic Polysomnogram performed on 05/12/2018 revealed: Moderate Complex, mostly Obstructive Sleep Apnea (OSA) at AHI 21.8/h. and RDI 25.8/h, AHI increased after 90 minutes of sleep. Loud snoring with RERAs. Prolonged and severe hypoxemia, Nadir SpO2 was 78%, total sleep time with low oxygen was 168 minutes of 321 min TST.  Dx: Likely OSA with COPD overlap. I like for patient to return for an attended sleep study with the opportunity to add oxygen in case that CPAP would treat only apnea, but may not help the hypoxemia component. The patient endorsed the Epworth Sleepiness Scale at 18 points and the Fatigue Score at 62/63 points, which is highly significant. The patient's weight 153 pounds with a height of 59.5 (inches), resulting in a BMI of 30.7 kg/m2.The patient's neck circumference measured 16 inches.  CURRENT MEDICATIONS: Proventil, Xanax, Norvasc, Cymbalta, Climara, Flonase, Neurontin, Xalatan, Xyzal, Synthroid, Cozaar, Robaxin, Toprol, Singulair, Zofran, Pravachol, Phenergan, Zantac   PROCEDURE:  This is a multichannel digital polysomnogram utilizing the SomnoStar 11.2 system.  Electrodes and sensors were applied and monitored per AASM Specifications.   EEG, EOG, Chin and Limb EMG, were sampled at 200 Hz.  ECG, Snore and Nasal Pressure, Thermal Airflow, Respiratory Effort, CPAP Flow and Pressure, Oximetry was sampled at 50 Hz. Digital video and audio were recorded.      CPAP was initiated at 5 cmH20 with heated humidity per AASM split night standards and pressure was advanced to 12 cmH20 because of hypopneas, apneas and desaturations. Nadir was 87% and total sleep  time was 150.5 minutes. A FFM SIMPLUS in small size was used. At a PAP pressure of 12 cmH20, there was a reduction of the AHI to 2.4/h. with improvement of sleep apnea.  Lights Out was at 22:12 and Lights On at 04:52. Total recording time (TRT) was 400.5 minutes, with a total sleep time (TST) of 302.5 minutes. The patient's sleep latency was 90 minutes. REM latency was 129.5 minutes.  The sleep efficiency was 75.5 %.    SLEEP ARCHITECTURE: WASO (Wake after sleep onset) was 5 minutes.  There were 6.5 minutes in Stage N1, 223.5 minutes Stage N2, 39.5 minutes Stage N3 and 33 minutes in Stage REM.  The percentage of Stage N1 was 2.1%, Stage N2 was 73.9%, Stage N3 was 13.1% and Stage R (REM sleep) was 10.9%.   RESPIRATORY ANALYSIS:  There was a total of 22 respiratory events: 3 obstructive apneas, 9 central apneas and 0 mixed apneas with a total of 12 apneas and an apnea index (AI) of 2.4 /hour. There were 10 hypopneas with a hypopnea index of 2./hour. The patient also had 0 respiratory event related arousals (RERAs). The total APNEA/HYPOPNEA INDEX  (AHI) was 4.4 /hour and the total RESPIRATORY DISTURBANCE INDEX was 4.4 /hour  6 events occurred in REM sleep and 16 events in NREM. The REM AHI was 10.9 /hour versus a non-REM AHI of 3.6 /hour.  The patient spent 302.5 minutes of total sleep time in the supine position and 0 minutes in non-supine. The supine AHI was 4.4, versus a non-supine AHI of 0.0.  OXYGEN SATURATION & C02:  The baseline 02 saturation was 96%, with the lowest being 84%. Time spent below 89% saturation equaled 11 minutes.  PERIODIC LIMB MOVEMENTS: The arousals were noted as: 24 were spontaneous, 4 were associated with PLMs and 7 were associated with respiratory events. The patient had a total of 38 Periodic Limb Movements. The Periodic Limb Movement (PLM) index was 7.5 and the PLM Arousal index was .8 /hour.  Audio and video analysis did not show any abnormal or unusual movements, behaviors,  phonations or vocalizations. The patient took 2 bathroom breaks. Snoring was noted up to 10 cm CPAP.EKG was in keeping with normal sinus rhythm (NSR).  DIAGNOSIS: Central Sleep Apnea emerged under CPAP but could be controlled under 10 and 12 cm water pressure. The patient was fitted with a Small Simplus FFM mask.   PLANS/RECOMMENDATIONS: auto CPAP with heated humidity and a pressure setting form 6 through 15 cm water with 3 cm EPR.  1. Achieve and Maintain lean body weight. 2. CPAP therapy compliance is defined as 4 hours or more of nightly use. 3. The patient should avoid evening tobacco use, sedatives, hypnotics, and alcoholic beverage consumption   DISCUSSION: Post-study, the patient indicated that sleep was much better than usual.    A follow up appointment will be scheduled in the Sleep Clinic at [facility].   Please call [phone] with any questions.      I certify that I have reviewed the entire raw data recording prior to the issuance of this report in accordance with the Standards of Accreditation of the American Academy of Sleep Medicine (AASM)    Larey Seat, M.D.   06-29-2018 [facility] Diplomat, American Board of Psychiatry and Neurology  Diplomat, [place] [facility] Medical Director, [place] [facility] [employer]

## 2018-06-29 NOTE — Addendum Note (Signed)
Addended by: Larey Seat on: 06/29/2018 06:47 PM   Modules accepted: Orders

## 2018-06-30 ENCOUNTER — Telehealth: Payer: Self-pay | Admitting: Neurology

## 2018-06-30 NOTE — Telephone Encounter (Signed)
I called pt. I advised pt that Dr. Brett Fairy reviewed their sleep study results and found that pt was able to tolerate the CPAP at pressure of 12 cm water pressure. Dr. Brett Fairy recommends that pt starts auto CPAP 6-15 cm water pressure. I reviewed PAP compliance expectations with the pt. Pt is agreeable to starting a CPAP. I advised pt that an order will be sent to a DME, [facility], and [facility] will call the pt within about one week after they file with the pt's insurance. [facility] will show the pt how to use the machine, fit for masks, and troubleshoot the CPAP if needed. A follow up appt was made for insurance purposes with Dan Humphreys, NP on March 16,2020 at 12:45 pm. Pt verbalized understanding to arrive 15 minutes early and bring their CPAP. A letter with all of this information in it will be mailed to the pt as a reminder. I verified with the pt that the address we have on file is correct. Pt verbalized understanding of results. Pt had no questions at this time but was encouraged to call back if questions arise. I have sent the order to [facility] and have received confirmation that they have received the order.

## 2018-06-30 NOTE — Telephone Encounter (Signed)
-----   Message from Larey Seat, MD sent at 06/29/2018  6:47 PM EST ----- DIAGNOSIS: Central Sleep Apnea emerged under CPAP but could be  controlled under 10 and 12 cm water pressure. The patient was  fitted with a Small Simplus FFM mask.   PLANS/RECOMMENDATIONS: auto CPAP with heated humidity and a  pressure setting form 6 through 15 cm water with 3 cm EPR.  1. Achieve and Maintain lean body weight. 2. CPAP therapy compliance is defined as 4 hours or more of  nightly use. 3. The patient should avoid evening tobacco use, sedatives,  hypnotics, and alcoholic beverage consumption

## 2018-07-10 DIAGNOSIS — G4733 Obstructive sleep apnea (adult) (pediatric): Secondary | ICD-10-CM | POA: Diagnosis not present

## 2018-07-24 DIAGNOSIS — G603 Idiopathic progressive neuropathy: Secondary | ICD-10-CM | POA: Diagnosis not present

## 2018-07-24 DIAGNOSIS — M545 Low back pain: Secondary | ICD-10-CM | POA: Diagnosis not present

## 2018-07-24 DIAGNOSIS — R202 Paresthesia of skin: Secondary | ICD-10-CM | POA: Diagnosis not present

## 2018-07-24 DIAGNOSIS — R270 Ataxia, unspecified: Secondary | ICD-10-CM | POA: Diagnosis not present

## 2018-07-28 DIAGNOSIS — F418 Other specified anxiety disorders: Secondary | ICD-10-CM | POA: Diagnosis not present

## 2018-07-31 ENCOUNTER — Other Ambulatory Visit: Payer: Self-pay | Admitting: Specialist

## 2018-07-31 DIAGNOSIS — M79604 Pain in right leg: Secondary | ICD-10-CM

## 2018-07-31 DIAGNOSIS — M5417 Radiculopathy, lumbosacral region: Secondary | ICD-10-CM

## 2018-07-31 DIAGNOSIS — R600 Localized edema: Secondary | ICD-10-CM

## 2018-08-05 ENCOUNTER — Ambulatory Visit
Admission: RE | Admit: 2018-08-05 | Discharge: 2018-08-05 | Disposition: A | Discharge status: Home / Self Care | Payer: Medicare HMO | Source: Ambulatory Visit | Attending: Specialist | Admitting: Specialist

## 2018-08-05 DIAGNOSIS — R600 Localized edema: Secondary | ICD-10-CM

## 2018-08-05 DIAGNOSIS — M7989 Other specified soft tissue disorders: Secondary | ICD-10-CM | POA: Diagnosis not present

## 2018-08-05 DIAGNOSIS — M79604 Pain in right leg: Secondary | ICD-10-CM

## 2018-08-07 DIAGNOSIS — E109 Type 1 diabetes mellitus without complications: Secondary | ICD-10-CM | POA: Diagnosis not present

## 2018-08-09 ENCOUNTER — Ambulatory Visit
Admission: RE | Admit: 2018-08-09 | Discharge: 2018-08-09 | Disposition: A | Discharge status: Home / Self Care | Payer: Medicare HMO | Source: Ambulatory Visit | Attending: Specialist | Admitting: Specialist

## 2018-08-09 DIAGNOSIS — M545 Low back pain: Secondary | ICD-10-CM | POA: Diagnosis not present

## 2018-08-09 DIAGNOSIS — G4733 Obstructive sleep apnea (adult) (pediatric): Secondary | ICD-10-CM | POA: Diagnosis not present

## 2018-08-09 DIAGNOSIS — M5417 Radiculopathy, lumbosacral region: Secondary | ICD-10-CM

## 2018-08-11 ENCOUNTER — Ambulatory Visit: Payer: Medicare HMO | Admitting: Podiatry

## 2018-08-11 ENCOUNTER — Encounter: Payer: Self-pay | Admitting: Podiatry

## 2018-08-11 DIAGNOSIS — M722 Plantar fascial fibromatosis: Secondary | ICD-10-CM

## 2018-08-11 MED ORDER — TRIAMCINOLONE ACETONIDE 10 MG/ML IJ SUSP
10.0000 mg | Freq: Once | INTRAMUSCULAR | Status: AC
  Administered 2018-08-11: 10 mg

## 2018-08-13 ENCOUNTER — Telehealth: Payer: Self-pay | Admitting: Podiatry

## 2018-08-13 NOTE — Telephone Encounter (Signed)
I called pt and informed occasionally pt's may have a steroid flare of symptoms after receiving a steroid injection and it should decrease in 3-5 days, ice 15-20 minutes 3-4 times daily protecting the skin from the ice with a light cloth, wear supportive shoes and if tolerates OTC antiinflammatories like ibuprofen take as the package recommends. Pt states understanding.

## 2018-08-13 NOTE — Progress Notes (Signed)
Subjective:   Patient ID: Ashley [name], female   DOB: 53 y.o.   MRN: [id number]   HPI Patient presents with quite a bit of discomfort plantar aspect right heel stating that it seems like its coming back a little bit quicker than before   ROS      Objective:  Physical Exam  Neurovascular status intact with discomfort of an intense nature right plantar fascial with pain is worse when getting up in the morning after periods of sitting     Assessment:  Acute plantar fasciitis right     Plan:  H&P condition reviewed and injected the fascia 3 mg Kenalog 5 mg Xylocaine and then dispensed night splint with all instructions on usage and physical therapy along with it.  Reappoint to recheck again as needed

## 2018-08-13 NOTE — Telephone Encounter (Signed)
I saw Dr. Paulla Dolly on Monday and got an injection in my heel. It is worse now and I've been icing the area and I wore my splint. What else can I do to help it?

## 2018-08-21 DIAGNOSIS — M5412 Radiculopathy, cervical region: Secondary | ICD-10-CM | POA: Diagnosis not present

## 2018-08-21 DIAGNOSIS — G5603 Carpal tunnel syndrome, bilateral upper limbs: Secondary | ICD-10-CM | POA: Diagnosis not present

## 2018-08-21 DIAGNOSIS — M5417 Radiculopathy, lumbosacral region: Secondary | ICD-10-CM | POA: Diagnosis not present

## 2018-08-21 DIAGNOSIS — R600 Localized edema: Secondary | ICD-10-CM | POA: Diagnosis not present

## 2018-08-21 DIAGNOSIS — G603 Idiopathic progressive neuropathy: Secondary | ICD-10-CM | POA: Diagnosis not present

## 2018-08-21 DIAGNOSIS — G9050 Complex regional pain syndrome I, unspecified: Secondary | ICD-10-CM | POA: Diagnosis not present

## 2018-08-21 DIAGNOSIS — M79604 Pain in right leg: Secondary | ICD-10-CM | POA: Diagnosis not present

## 2018-08-25 ENCOUNTER — Ambulatory Visit: Payer: Self-pay | Admitting: Adult Health

## 2018-08-28 DIAGNOSIS — F418 Other specified anxiety disorders: Secondary | ICD-10-CM | POA: Diagnosis not present

## 2018-09-03 ENCOUNTER — Telehealth: Payer: Self-pay

## 2018-09-03 NOTE — Telephone Encounter (Signed)
Pt gave consent for tele visit and to file insurance.I called to verify with pt.

## 2018-09-03 NOTE — Telephone Encounter (Signed)
Left vm for patient to call back to schedule tvisit and get consent to file insurance and do visit. Explain the COVID 19 restrictions. LEft vm to call back.

## 2018-09-04 ENCOUNTER — Encounter: Payer: Self-pay | Admitting: Adult Health

## 2018-09-04 ENCOUNTER — Encounter (INDEPENDENT_AMBULATORY_CARE_PROVIDER_SITE_OTHER): Payer: Medicare HMO | Admitting: Adult Health

## 2018-09-04 ENCOUNTER — Other Ambulatory Visit: Payer: Self-pay

## 2018-09-04 ENCOUNTER — Telehealth: Payer: Self-pay | Admitting: Adult Health

## 2018-09-04 DIAGNOSIS — Z0289 Encounter for other administrative examinations: Secondary | ICD-10-CM

## 2018-09-04 NOTE — Telephone Encounter (Signed)
Per review of CPAP download report, she did not initiate use until 08/11/2018.  Please cancel today's telephone visit and reschedule her 2 weeks out for reevaluation of CPAP compliance as this is still within initial 90 days.

## 2018-09-04 NOTE — Telephone Encounter (Signed)
I called patient, I was unable to find availability 2 weeks out. I scheduled patient for April 29th, which is still in compliance period.

## 2018-09-08 DIAGNOSIS — G4733 Obstructive sleep apnea (adult) (pediatric): Secondary | ICD-10-CM | POA: Diagnosis not present

## 2018-09-09 DIAGNOSIS — Z4681 Encounter for fitting and adjustment of insulin pump: Secondary | ICD-10-CM | POA: Diagnosis not present

## 2018-09-09 DIAGNOSIS — Z794 Long term (current) use of insulin: Secondary | ICD-10-CM | POA: Diagnosis not present

## 2018-09-09 DIAGNOSIS — E1040 Type 1 diabetes mellitus with diabetic neuropathy, unspecified: Secondary | ICD-10-CM | POA: Diagnosis not present

## 2018-09-09 DIAGNOSIS — I10 Essential (primary) hypertension: Secondary | ICD-10-CM | POA: Diagnosis not present

## 2018-09-12 ENCOUNTER — Other Ambulatory Visit: Payer: Self-pay

## 2018-09-12 ENCOUNTER — Ambulatory Visit: Payer: Medicare HMO | Admitting: Podiatry

## 2018-09-12 ENCOUNTER — Encounter: Payer: Self-pay | Admitting: Podiatry

## 2018-09-12 VITALS — Temp 98.4°F

## 2018-09-12 DIAGNOSIS — M722 Plantar fascial fibromatosis: Secondary | ICD-10-CM

## 2018-09-12 MED ORDER — TRIAMCINOLONE ACETONIDE 10 MG/ML IJ SUSP
10.0000 mg | Freq: Once | INTRAMUSCULAR | Status: AC
  Administered 2018-09-12: 10 mg

## 2018-09-15 NOTE — Progress Notes (Signed)
Subjective:   Patient ID: Ashley [name], female   DOB: 53 y.o.   MRN: [id number]   HPI Patient states her heel has been hurting really bad and she admits that she has not been wearing the boot as we had discussed and it is just become acutely inflamed again   ROS      Objective:  Physical Exam  Neurovascular status intact with plantar fascial pain right that is still quite sore medial band and so far has not responded conservatively     Assessment:  Continuation of acute plantar fasciitis right     Plan:  Advised on anti-inflammatories and the importance of immobilization and will get a try one more injection even though I did discuss that ultimate surgical intervention may be necessary in this case.  Patient wants to try to avoid surgery and I did do sterile prep and injected the fascia 3 mg Kenalog 5 mg Xylocaine and patient will be seen back for [place] to recheck again in 3 weeks or earlier if needed

## 2018-09-17 DIAGNOSIS — I10 Essential (primary) hypertension: Secondary | ICD-10-CM | POA: Diagnosis not present

## 2018-09-17 DIAGNOSIS — E7849 Other hyperlipidemia: Secondary | ICD-10-CM | POA: Diagnosis not present

## 2018-09-17 DIAGNOSIS — E0500 Thyrotoxicosis with diffuse goiter without thyrotoxic crisis or storm: Secondary | ICD-10-CM | POA: Diagnosis not present

## 2018-09-17 DIAGNOSIS — E1040 Type 1 diabetes mellitus with diabetic neuropathy, unspecified: Secondary | ICD-10-CM | POA: Diagnosis not present

## 2018-09-22 DIAGNOSIS — F418 Other specified anxiety disorders: Secondary | ICD-10-CM | POA: Diagnosis not present

## 2018-09-22 DIAGNOSIS — E785 Hyperlipidemia, unspecified: Secondary | ICD-10-CM | POA: Diagnosis not present

## 2018-09-22 DIAGNOSIS — K3184 Gastroparesis: Secondary | ICD-10-CM | POA: Diagnosis not present

## 2018-09-22 DIAGNOSIS — K219 Gastro-esophageal reflux disease without esophagitis: Secondary | ICD-10-CM | POA: Diagnosis not present

## 2018-09-22 DIAGNOSIS — E1040 Type 1 diabetes mellitus with diabetic neuropathy, unspecified: Secondary | ICD-10-CM | POA: Diagnosis not present

## 2018-09-22 DIAGNOSIS — Z0000 Encounter for general adult medical examination without abnormal findings: Secondary | ICD-10-CM | POA: Diagnosis not present

## 2018-09-22 DIAGNOSIS — I10 Essential (primary) hypertension: Secondary | ICD-10-CM | POA: Diagnosis not present

## 2018-09-22 DIAGNOSIS — J45909 Unspecified asthma, uncomplicated: Secondary | ICD-10-CM | POA: Diagnosis not present

## 2018-09-22 DIAGNOSIS — F17200 Nicotine dependence, unspecified, uncomplicated: Secondary | ICD-10-CM | POA: Diagnosis not present

## 2018-09-23 DIAGNOSIS — E1140 Type 2 diabetes mellitus with diabetic neuropathy, unspecified: Secondary | ICD-10-CM | POA: Diagnosis not present

## 2018-09-26 DIAGNOSIS — M47816 Spondylosis without myelopathy or radiculopathy, lumbar region: Secondary | ICD-10-CM | POA: Diagnosis not present

## 2018-09-26 DIAGNOSIS — M5417 Radiculopathy, lumbosacral region: Secondary | ICD-10-CM | POA: Diagnosis not present

## 2018-10-03 ENCOUNTER — Encounter: Payer: Self-pay | Admitting: Podiatry

## 2018-10-03 ENCOUNTER — Other Ambulatory Visit: Payer: Self-pay

## 2018-10-03 ENCOUNTER — Ambulatory Visit: Payer: Medicare HMO | Admitting: Podiatry

## 2018-10-03 VITALS — Temp 97.3°F

## 2018-10-03 DIAGNOSIS — M722 Plantar fascial fibromatosis: Secondary | ICD-10-CM | POA: Diagnosis not present

## 2018-10-03 DIAGNOSIS — M79671 Pain in right foot: Secondary | ICD-10-CM | POA: Diagnosis not present

## 2018-10-03 NOTE — Progress Notes (Signed)
Subjective:   Patient ID: Ashley [name], female   DOB: 53 y.o.   MRN: [id number]   HPI Patient states foot is feeling 80% better and is really made a big progress over the last few weeks   ROS      Objective:  Physical Exam  Neurovascular status intact with significant diminishment of discomfort in the plantar aspect right heel at the insertional point tendon calcaneus     Assessment:  Improved fasciitis symptoms right     Plan:  Reviewed at great length condition and that hopefully this will hold off but it still possible surgery necessary.  Reappoint to recheck on an as-needed basis

## 2018-10-06 ENCOUNTER — Telehealth: Payer: Self-pay | Admitting: Family Medicine

## 2018-10-06 NOTE — Telephone Encounter (Signed)
I called and spoke with the patient regarding changing her visit to a phone visit due to COVID-19. She consented to billing insurance and I made her aware a nurse would be reaching out for chart details.

## 2018-10-07 ENCOUNTER — Encounter: Payer: Self-pay | Admitting: Family Medicine

## 2018-10-07 NOTE — Telephone Encounter (Signed)
LMVM for pt to return call if any changes to her chart if needs to be updated from 10-03-18 then call me back otherwise no need.

## 2018-10-08 ENCOUNTER — Encounter: Payer: Self-pay | Admitting: Family Medicine

## 2018-10-08 ENCOUNTER — Other Ambulatory Visit: Payer: Self-pay

## 2018-10-08 ENCOUNTER — Ambulatory Visit (INDEPENDENT_AMBULATORY_CARE_PROVIDER_SITE_OTHER): Payer: Medicare HMO | Admitting: Family Medicine

## 2018-10-08 DIAGNOSIS — J449 Chronic obstructive pulmonary disease, unspecified: Secondary | ICD-10-CM | POA: Diagnosis not present

## 2018-10-08 DIAGNOSIS — G4733 Obstructive sleep apnea (adult) (pediatric): Secondary | ICD-10-CM

## 2018-10-08 DIAGNOSIS — Z9989 Dependence on other enabling machines and devices: Secondary | ICD-10-CM | POA: Diagnosis not present

## 2018-10-08 NOTE — Progress Notes (Signed)
PATIENT: Ashley [name] DOB: 08-25-1965  REASON FOR VISIT: follow up HISTORY FROM: patient  Virtual Visit via Telephone Note  I connected with Ashley [name] on 10/08/18 at  2:00 PM EDT by telephone and verified that I am speaking with the correct person using two identifiers.   I discussed the limitations, risks, security and privacy concerns of performing an evaluation and management service by telephone and the availability of in person appointments. I also discussed with the patient that there may be a patient responsible charge related to this service. The patient expressed understanding and agreed to proceed.   History of Present Illness:  10/08/18 Ashley [name] is a 53 y.o. female for follow up of OSA on CPAP.  Ashley [name] reports that she is doing well on CPAP therapy.  She is continuing to get adjusted to using her machine every night.  She states that she uses it every night but does sometimes pull the machine off in the middle the night.  There are days that she does not get the complete 4-hour therapy recommended.  She has noticed that her mask leaks air around the nasal bridge.  She is using a fullface mask.  She feels that the head piece is a little flimsy and that maybe she could tighten this up.  Otherwise she is doing well without complaints.   09/08/2018 - 10/07/2018 0 - 10/07/2018 Usage days 30/30 days (100%) >= 4 hours 16 days (53%) < 4 hours 14 days (47%) Usage hours 142 hours 1 minutes Average usage (total days) 4 hours 44 minutes Average usage (days used) 4 hours 44 minutes Median usage (days used) 4 hours 17 minutes Total used hours (value since last reset - 10/07/2018) 271 hours AirSense 10 AutoSet For Her Serial number [id number] Mode AutoSet Min Pressure 6 cmH2O Max Pressure 15 cmH2O EPR Fulltime EPR level 3 Response Standard Therapy Pressure - cmH2O Median: 9.1 95th percentile: 12.1 Maximum: 13.2 Leaks - L/min Median: 9.9 95th percentile:  37.6 Maximum: 70.1 Events per hour AI: 3.2 HI: 0.5 AHI: 3.7 Apnea Index Central: 0.2 Obstructive: 1.7 Unknown: 1.2 RERA Index 0.8   History (copied from Ashley [name] note on 04/02/2018)  HPI:  Ashley [name] is a 53 y.o. female caucasian, left handed patient, seen on 04-02-2018 in a referral from Ashley. Dagmar [name] for a sleep evaluation.  Chief complaint according to patient : " my poor sleep is interfering with my marriage " ; "My mind is racing and I go to bed at 2 AM, rise at 6 am"  " I am so very sleepy and fatigued, and I fall asleep at my kitchen table " .  Her spouse has told her that she has apnea at night that she snores loudly at night and she is excessively daytime sleepy.  Recently she went to a beach trip with her sister who also said the patient stop breathing while sleeping.  She would like to have a sleep study.  Sleep habits are as follows: she eats her last meal just before sleep at 1 AM- a microwaved frozen dinner- she cooks the meal for her family at 5-6 pm, and that's a meat and potato dinner. She just came into the habit after losing her job. She does all the housecleaning between 6 pm and midnight. She is wound up, not feeling ready to rest.  Bedroom shared with her husband, who watches TV in bed. She is asleep within minutes and stays asleep until husbands alarm rings  at 6.30am. She doesn't eat breakfast, drinks coffee- and stays at home, she feels not like doing anything, blue.  Her husband reports her to snore loudly and she stops to breath, her mouth is dry, her throat raw. She averages 4-5 hours of sleep, and takes unscheduled naps of 1 hour when not active - and active she is only at night time, after dark- when her mind starts racing.    Sleep medical history and family sleep history: no family history of OSA . Personal history of obesity, DM dx at age 43 , HTN, depression, fatigue, tearful in daytime  and hypomania in the evening.  She has chronic right heel pain she  fractured her tarsals, sitting and resting actually relieves her of the pain.  Has bilateral ear pain, has a sore throat coughs at night,  Coughing also interrupt sleep, has been using Delsym is decongestant, is taking Xyzal daily and has seen Ashley. Festus Holts for sinus surgery.  She has frequent bronchitis- asthma /COPD.  Social history: She  lost her work place due to disability after 32 years with the company at [employer]. Since she broke her foot in late 2017 she felt it never healed well, also due to diabetes. She is in neurological treatment and gets shots in her back (?).  She lost her mother to lung and liver cancer in 2017, too. She became severely depressed.  Son is 33. Married, unemployed/ disabled. Smoker- 1/2 ppd- ETOH none, caffeine - 1 cup of coffee in AM, drinks Ashley Samson [name], 3-4 glasses a day, all in PM. 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    Observations/Objective:  Generalized: Well developed, in no acute distress  Mentation: Alert oriented to time, place, history taking. Follows all commands speech and language fluent   Assessment and Plan:  53 y.o. year old female  has a past medical history of Anxiety, Depression, Diabetes mellitus ([place]), Esophageal stricture, Gastroparesis, GERD (gastroesophageal reflux disease), Graves disease (25/0037), Helicobacter pylori infection, Hyperlipidemia, Hypertension, Insulin pump in place, Nephrolithiasis (2010), Neuropathy, Osteoporosis, and Vitamin D deficiency. with    ICD-10-CM   1. OSA and COPD overlap syndrome (HCC) G47.33 For home use only DME continuous positive airway pressure (CPAP)   J44.9   2. OSA on CPAP G47.33 For home use only DME continuous positive airway pressure (CPAP)   Z99.89    Ashley [name] is doing well on CPAP therapy.  She does note some leakage of air across the bridge of her nose.  She feels that she can tighten her head strap at home and does not need refitting at this time.  I have  advised that we will send an order for new supplies including mask and headgear.  If she is unable to get a comfortable fit without feeling that leak she was instructed to call our office.  She was encouraged to continue using CPAP therapy nightly and for greater than 4 hours each night.  She verbalizes understanding and agreement with this plan.  I have advised that we follow-up with her in about 8 weeks to repeat download and ensure her leak is improved.  Orders Placed This Encounter  Procedures   For home use only DME continuous positive airway pressure (CPAP)    All supplies please    Order Specific Question:   Patient has OSA or probable OSA    Answer:   Yes    Order Specific Question:   Is the  patient currently using CPAP in the home    Answer:   Yes    Order Specific Question:   Settings    Answer:   Other see comments    Order Specific Question:   CPAP supplies needed    Answer:   Mask, headgear, cushions, filters, heated tubing and water chamber    No orders of the defined types were placed in this encounter.    Follow Up Instructions:  I discussed the assessment and treatment plan with the patient. The patient was provided an opportunity to ask questions and all were answered. The patient agreed with the plan and demonstrated an understanding of the instructions.   The patient was advised to call back or seek an in-person evaluation if the symptoms worsen or if the condition fails to improve as anticipated.  I provided 25 minutes of non-face-to-face time during this encounter.  Patient is located at her place of residence during teleconference.  Provider is located at her place of residence.  Liane Comber, RN helped to facilitate visit.   Debbora Presto, NP

## 2018-10-09 DIAGNOSIS — G4733 Obstructive sleep apnea (adult) (pediatric): Secondary | ICD-10-CM | POA: Diagnosis not present

## 2018-10-17 DIAGNOSIS — R05 Cough: Secondary | ICD-10-CM | POA: Diagnosis not present

## 2018-10-17 DIAGNOSIS — J329 Chronic sinusitis, unspecified: Secondary | ICD-10-CM | POA: Diagnosis not present

## 2018-10-17 DIAGNOSIS — G4733 Obstructive sleep apnea (adult) (pediatric): Secondary | ICD-10-CM | POA: Diagnosis not present

## 2018-10-17 DIAGNOSIS — I10 Essential (primary) hypertension: Secondary | ICD-10-CM | POA: Diagnosis not present

## 2018-10-17 DIAGNOSIS — E1040 Type 1 diabetes mellitus with diabetic neuropathy, unspecified: Secondary | ICD-10-CM | POA: Diagnosis not present

## 2018-10-17 DIAGNOSIS — Z794 Long term (current) use of insulin: Secondary | ICD-10-CM | POA: Diagnosis not present

## 2018-10-23 DIAGNOSIS — M47817 Spondylosis without myelopathy or radiculopathy, lumbosacral region: Secondary | ICD-10-CM | POA: Diagnosis not present

## 2018-11-08 DIAGNOSIS — G4733 Obstructive sleep apnea (adult) (pediatric): Secondary | ICD-10-CM | POA: Diagnosis not present

## 2018-11-13 ENCOUNTER — Encounter: Payer: Medicare HMO | Admitting: Podiatry

## 2018-11-13 ENCOUNTER — Other Ambulatory Visit: Payer: Self-pay

## 2018-11-17 ENCOUNTER — Ambulatory Visit (INDEPENDENT_AMBULATORY_CARE_PROVIDER_SITE_OTHER): Payer: Medicare HMO

## 2018-11-17 ENCOUNTER — Ambulatory Visit (INDEPENDENT_AMBULATORY_CARE_PROVIDER_SITE_OTHER): Payer: Medicare HMO | Admitting: Podiatry

## 2018-11-17 ENCOUNTER — Encounter: Payer: Self-pay | Admitting: Podiatry

## 2018-11-17 ENCOUNTER — Other Ambulatory Visit: Payer: Self-pay

## 2018-11-17 VITALS — Temp 97.5°F

## 2018-11-17 DIAGNOSIS — M722 Plantar fascial fibromatosis: Secondary | ICD-10-CM | POA: Diagnosis not present

## 2018-11-17 NOTE — Patient Instructions (Signed)
Pre-Operative Instructions  Congratulations, you have decided to take an important step towards improving your quality of life.  You can be assured that the doctors and staff at [facility] will be with you every step of the way.  Here are some important things you should know:  1. Plan to be at the surgery center/hospital at least 1 (one) hour prior to your scheduled time, unless otherwise directed by the surgical center/hospital staff.  You must have a responsible adult accompany you, remain during the surgery and drive you home.  Make sure you have directions to the surgical center/hospital to ensure you arrive on time. 2. If you are having surgery at [facility] or [facility], you will need a copy of your medical history and physical form from your family physician within one month prior to the date of surgery. We will give you a form for your primary physician to complete.  3. We make every effort to accommodate the date you request for surgery.  However, there are times where surgery dates or times have to be moved.  We will contact you as soon as possible if a change in schedule is required.   4. No aspirin/ibuprofen for one week before surgery.  If you are on aspirin, any non-steroidal anti-inflammatory medications (Mobic, Aleve, Ibuprofen) should not be taken seven (7) days prior to your surgery.  You make take Tylenol for pain prior to surgery.  5. Medications - If you are taking daily heart and blood pressure medications, seizure, reflux, allergy, asthma, anxiety, pain or diabetes medications, make sure you notify the surgery center/hospital before the day of surgery so they can tell you which medications you should take or avoid the day of surgery. 6. No food or drink after midnight the night before surgery unless directed otherwise by surgical center/hospital staff. 7. No alcoholic beverages 24-hours prior to surgery.  No smoking 24-hours prior or 24-hours after  surgery. 8. Wear loose pants or shorts. They should be loose enough to fit over bandages, boots, and casts. 9. Don't wear slip-on shoes. Sneakers are preferred. 10. Bring your boot with you to the surgery center/hospital.  Also bring crutches or a walker if your physician has prescribed it for you.  If you do not have this equipment, it will be provided for you after surgery. 11. If you have not been contacted by the surgery center/hospital by the day before your surgery, call to confirm the date and time of your surgery. 12. Leave-time from work may vary depending on the type of surgery you have.  Appropriate arrangements should be made prior to surgery with your employer. 13. Prescriptions will be provided immediately following surgery by your doctor.  Fill these as soon as possible after surgery and take the medication as directed. Pain medications will not be refilled on weekends and must be approved by the doctor. 14. Remove nail polish on the operative foot and avoid getting pedicures prior to surgery. 15. Wash the night before surgery.  The night before surgery wash the foot and leg well with water and the antibacterial soap provided. Be sure to pay special attention to beneath the toenails and in between the toes.  Wash for at least three (3) minutes. Rinse thoroughly with water and dry well with a towel.  Perform this wash unless told not to do so by your physician.  Enclosed: 1 Ice pack (please put in freezer the night before surgery)   1 Hibiclens skin cleaner     Pre-op instructions  If you have any questions regarding the instructions, please do not hesitate to call our office.  [place]: [address], [place], [place] [address] -- [phone]  [place]: [address] [place], [place] [address] -- [phone]  [place]: [address]  [place], [place] [address] -- [phone]  [place]: [address], [address], [place], [place] [address] -- [phone]  Website:  [email or web address] 

## 2018-11-18 ENCOUNTER — Telehealth: Payer: Self-pay | Admitting: *Deleted

## 2018-11-18 DIAGNOSIS — R0989 Other specified symptoms and signs involving the circulatory and respiratory systems: Secondary | ICD-10-CM

## 2018-11-18 DIAGNOSIS — M84374A Stress fracture, right foot, initial encounter for fracture: Secondary | ICD-10-CM

## 2018-11-18 DIAGNOSIS — M79671 Pain in right foot: Secondary | ICD-10-CM

## 2018-11-18 NOTE — Telephone Encounter (Signed)
Dr. Paulla Dolly ordered ABI with TBI, arterials prior to surgery to r/o claudication prior to right heel surgery, and medical clearance from family doctor for diabetes. Faxed orders to [facility], and [place] [facility] Coordinator for medical clearance.

## 2018-11-19 ENCOUNTER — Telehealth: Payer: Self-pay | Admitting: *Deleted

## 2018-11-19 DIAGNOSIS — Z4681 Encounter for fitting and adjustment of insulin pump: Secondary | ICD-10-CM | POA: Diagnosis not present

## 2018-11-19 DIAGNOSIS — I10 Essential (primary) hypertension: Secondary | ICD-10-CM | POA: Diagnosis not present

## 2018-11-19 DIAGNOSIS — E1040 Type 1 diabetes mellitus with diabetic neuropathy, unspecified: Secondary | ICD-10-CM | POA: Diagnosis not present

## 2018-11-19 DIAGNOSIS — Z794 Long term (current) use of insulin: Secondary | ICD-10-CM | POA: Diagnosis not present

## 2018-11-19 NOTE — Telephone Encounter (Addendum)
"  I called my primary physician, Dr. Dagmar Hait,.  They called me back today and told me to talk to you and to fax over a surgical clearance for that they need.  Their fax number is [phone].  If you could fax that, I'd really appreciate it."

## 2018-11-19 NOTE — Progress Notes (Signed)
Subjective:   Patient ID: Ashley [name], female   DOB: 53 y.o.   MRN: [id number]   HPI Patient presents stating that her right heel has really been bothering her and her sugar at times is high and her last A1c is 8.2.  Patient states that heel gets quite sore and she does smoke a half a pack of cigarettes per day   ROS      Objective:  Physical Exam  I did note vascular status appears to be diminished at the current time and it was difficult to say as far as whether or not there is any cardiovascular issues.  Patient has exquisite discomfort medial aspect right heel insertional point tendon calcaneus     Assessment:  Patient has chronic plantar fasciitis right but I am concerned about the possibility for vascular issues and the fact that her sugars been running slightly high     Plan:  Reviewed condition discussed that I want her to stop smoking at the current time as best as possible and I do want her to have vascular studies done before I would consider endoscopic type surgery.  I did go ahead and I let her read a consent form today going over all possible complications associated with endoscopic surgery and if we get a good report on vascular flow into the right foot we get approval from her personal doctor we will proceed with endoscopic surgery.  She understands all risk and the fact it may not improve her problem or actually worsen any kind of arch pain or other problems she may half and that it takes 6 months to 1 year for complete healing

## 2018-11-21 DIAGNOSIS — M47816 Spondylosis without myelopathy or radiculopathy, lumbar region: Secondary | ICD-10-CM | POA: Diagnosis not present

## 2018-11-21 DIAGNOSIS — M5417 Radiculopathy, lumbosacral region: Secondary | ICD-10-CM | POA: Diagnosis not present

## 2018-11-24 ENCOUNTER — Encounter: Payer: Self-pay | Admitting: *Deleted

## 2018-11-24 NOTE — Progress Notes (Signed)
Per Dr. Paulla Dolly, I sent a surgical medical clearance request letter to Dr. Dagmar Hait.

## 2018-11-24 NOTE — Telephone Encounter (Signed)
I faxed a medical clearance letter to Dr. Dagmar Hait.

## 2018-11-24 NOTE — Telephone Encounter (Signed)
"  I spoke to Dr. Danna Hefty office and they said they have not received any forms to fill out for my clearance."  I sent Dr. Dagmar Hait a medical clearance letter this morning.  "You did, oh okay.  I'll follow up with Dr. Dagmar Hait.

## 2018-11-25 ENCOUNTER — Telehealth: Payer: Self-pay | Admitting: *Deleted

## 2018-11-25 DIAGNOSIS — R0989 Other specified symptoms and signs involving the circulatory and respiratory systems: Secondary | ICD-10-CM

## 2018-11-25 DIAGNOSIS — M79671 Pain in right foot: Secondary | ICD-10-CM

## 2018-11-25 NOTE — Telephone Encounter (Signed)
[place] - [facility] states put in new orders for B/L orders and she would take out the orders for just the right. Faxed B/L orders to [facility].

## 2018-11-25 NOTE — Telephone Encounter (Signed)
Pt called states Dr. Paulla Dolly wants her to have test on her right leg before the plantar fasciitis surgery, her back doctor wants her to have the test on the left leg.

## 2018-11-25 NOTE — Telephone Encounter (Signed)
Left message for Ashley [name] to call concerning adding a left leg to pt's vascular study of 12/16/2018.

## 2018-11-27 DIAGNOSIS — G4733 Obstructive sleep apnea (adult) (pediatric): Secondary | ICD-10-CM | POA: Diagnosis not present

## 2018-11-27 DIAGNOSIS — I10 Essential (primary) hypertension: Secondary | ICD-10-CM | POA: Diagnosis not present

## 2018-11-27 DIAGNOSIS — E785 Hyperlipidemia, unspecified: Secondary | ICD-10-CM | POA: Diagnosis not present

## 2018-11-27 DIAGNOSIS — J45909 Unspecified asthma, uncomplicated: Secondary | ICD-10-CM | POA: Diagnosis not present

## 2018-11-27 DIAGNOSIS — M722 Plantar fascial fibromatosis: Secondary | ICD-10-CM | POA: Diagnosis not present

## 2018-11-27 DIAGNOSIS — J302 Other seasonal allergic rhinitis: Secondary | ICD-10-CM | POA: Diagnosis not present

## 2018-11-27 DIAGNOSIS — Z794 Long term (current) use of insulin: Secondary | ICD-10-CM | POA: Diagnosis not present

## 2018-11-27 DIAGNOSIS — E0500 Thyrotoxicosis with diffuse goiter without thyrotoxic crisis or storm: Secondary | ICD-10-CM | POA: Diagnosis not present

## 2018-11-27 DIAGNOSIS — E1040 Type 1 diabetes mellitus with diabetic neuropathy, unspecified: Secondary | ICD-10-CM | POA: Diagnosis not present

## 2018-11-27 NOTE — Progress Notes (Signed)
This encounter was created in error - please disregard.

## 2018-12-02 ENCOUNTER — Telehealth: Payer: Self-pay

## 2018-12-02 ENCOUNTER — Telehealth: Payer: Self-pay | Admitting: *Deleted

## 2018-12-02 DIAGNOSIS — M47816 Spondylosis without myelopathy or radiculopathy, lumbar region: Secondary | ICD-10-CM | POA: Diagnosis not present

## 2018-12-02 DIAGNOSIS — E109 Type 1 diabetes mellitus without complications: Secondary | ICD-10-CM | POA: Diagnosis not present

## 2018-12-02 NOTE — Telephone Encounter (Signed)
"  I'm supposed to have surgery with Dr. Paulla Dolly for Plantar Fasciitis.  I went to my doctor about my A1c.  I can't do the surgery right now because my A1c is 8.0.  My doctor wants to know when it comes down to it, will Dr. Mellody Drown office do the pre-op labs or will I have to do it with my primary doctor.  If you could, at your earliest convenience, give me a call back."

## 2018-12-02 NOTE — Telephone Encounter (Signed)
Unable to get in contact with the patient to convert their office appt with Amy on 12/03/2018 into a mychart video visit. I left a voicemail asking the patient to return my call. Office number was provided.    If patient calls back please convert their office visit into a mychart video visit.

## 2018-12-03 ENCOUNTER — Ambulatory Visit: Payer: Medicare HMO | Admitting: Family Medicine

## 2018-12-04 NOTE — Telephone Encounter (Signed)
I left her a message that she needs to get the A1c done by her primary care physician.

## 2018-12-05 ENCOUNTER — Telehealth: Payer: Self-pay | Admitting: Podiatry

## 2018-12-05 NOTE — Telephone Encounter (Signed)
Having pain and would like something for pain.

## 2018-12-08 ENCOUNTER — Other Ambulatory Visit: Payer: Self-pay

## 2018-12-08 ENCOUNTER — Other Ambulatory Visit: Payer: Self-pay | Admitting: Podiatry

## 2018-12-08 ENCOUNTER — Telehealth: Payer: Self-pay

## 2018-12-08 MED ORDER — HYDROCODONE-ACETAMINOPHEN 10-325 MG PO TABS: 1.0000 | ORAL_TABLET | Freq: Three times a day (TID) | ORAL | 0 refills | Status: AC | PRN

## 2018-12-08 NOTE — Telephone Encounter (Signed)
Pt requested oxycodone 7.5mg /325. Pt Informed she has a 10mg  hydrocodone script available at the pharmacy. Pt also asked about adding her left leg added to her vascular study, pt was informed this would be done.

## 2018-12-08 NOTE — Progress Notes (Unsigned)
hyd

## 2018-12-09 DIAGNOSIS — G4733 Obstructive sleep apnea (adult) (pediatric): Secondary | ICD-10-CM | POA: Diagnosis not present

## 2018-12-09 DIAGNOSIS — I10 Essential (primary) hypertension: Secondary | ICD-10-CM | POA: Insufficient documentation

## 2018-12-09 DIAGNOSIS — E039 Hypothyroidism, unspecified: Secondary | ICD-10-CM | POA: Diagnosis present

## 2018-12-10 DIAGNOSIS — Z6829 Body mass index (BMI) 29.0-29.9, adult: Secondary | ICD-10-CM | POA: Diagnosis not present

## 2018-12-10 DIAGNOSIS — Z01419 Encounter for gynecological examination (general) (routine) without abnormal findings: Secondary | ICD-10-CM | POA: Diagnosis not present

## 2018-12-10 DIAGNOSIS — Z1231 Encounter for screening mammogram for malignant neoplasm of breast: Secondary | ICD-10-CM | POA: Diagnosis not present

## 2018-12-11 ENCOUNTER — Encounter: Payer: Self-pay | Admitting: Family Medicine

## 2018-12-11 ENCOUNTER — Other Ambulatory Visit: Payer: Self-pay

## 2018-12-11 ENCOUNTER — Ambulatory Visit (INDEPENDENT_AMBULATORY_CARE_PROVIDER_SITE_OTHER): Payer: Medicare HMO | Admitting: Family Medicine

## 2018-12-11 VITALS — BP 118/65 | HR 81 | Temp 98.4°F | Ht 59.5 in | Wt 150.6 lb

## 2018-12-11 DIAGNOSIS — G4733 Obstructive sleep apnea (adult) (pediatric): Secondary | ICD-10-CM

## 2018-12-11 DIAGNOSIS — Z9989 Dependence on other enabling machines and devices: Secondary | ICD-10-CM

## 2018-12-11 NOTE — Progress Notes (Signed)
PATIENT: Ashley [name] DOB: 24-Dec-1965  REASON FOR VISIT: follow up HISTORY FROM: patient  Chief Complaint  Patient presents with  . Follow-up    8 wk f/u. Alone. Rm 8. No new concerns at this time.      HISTORY OF PRESENT ILLNESS: Today 12/11/18 Ashley [name] is a 53 y.o. female here today for follow up of OSA on CPAP.  She reports that she is doing better.  She did get a new mask and headgear.  She feels that this is helped with her leak.  She still feels tired.  Compliance data dated 11/10/2018 through 12/09/2018 reveals that she is using her CPAP machine 30 out of the last 30 days for compliance of 100%.  23 of those days were used greater than 4 hours for compliance of 77%.  AHI was 2.6 on 6 to 15 cm of water and EPR of 3.  There was no significant leak.  She returns today for evaluation  HISTORY: (copied from my note on 10/08/2018)  Ashley [name] is a 53 y.o. female for follow up of OSA on CPAP.  Ashley [name] reports that she is doing well on CPAP therapy.  She is continuing to get adjusted to using her machine every night.  She states that she uses it every night but does sometimes pull the machine off in the middle the night.  There are days that she does not get the complete 4-hour therapy recommended.  She has noticed that her mask leaks air around the nasal bridge.  She is using a fullface mask.  She feels that the head piece is a little flimsy and that maybe she could tighten this up.  Otherwise she is doing well without complaints.  REVIEW OF SYSTEMS: Out of a complete 14 system review of symptoms, the patient complains only of the following symptoms, headache and all other reviewed systems are negative.  Epworth sleepiness scale: 25 Fatigue severity scale: 28  ALLERGIES: Allergies  Allergen Reactions  . Clindamycin Hives  . Other Hives  . Sulfonamide Derivatives Hives  . Lipitor [Atorvastatin] Other (See Comments)    MUSCLE ACHES  . Restasis [Cyclosporine]    Pt stated, "My eyes turned red on the inside and outside of eye; burning sensation"  . Topamax [Topiramate] Itching    HOME MEDICATIONS: Outpatient Medications Prior to Visit  Medication Sig Dispense Refill  . ACCU-CHEK COMPACT PLUS test strip 1 each by Other route as needed.     Marland Kitchen ACCU-CHEK SOFTCLIX LANCETS lancets     . albuterol (PROVENTIL) (2.5 MG/3ML) 0.083% nebulizer solution Use every 2 hours as needed for severe wheezing for the next 3 days then decrease to every 4-6 hours  3  . ALPRAZolam (XANAX) 0.5 MG tablet Take 0.5 mg by mouth 3 (three) times daily as needed.     . AMBULATORY NON FORMULARY MEDICATION NOVOLOG INSULIN PUMP USES AS DIRECTED    . amLODipine (NORVASC) 5 MG tablet Take 5 mg by mouth daily.  3  . Azelastine HCl 0.15 % SOLN Place 1 spray into the nose daily.    . cyanocobalamin (,VITAMIN B-12,) 1000 MCG/ML injection INJECT 1 MILLILITER INTO THE MUSCLE ONCE A WEEK FOR 4 WEEKS THEN 1 MILLILITER ONCE A MONTH THERAFTER  4  . DULoxetine (CYMBALTA) 30 MG capsule TAKE ONE CAPSULE DAILY IN ADDITION TO 60 MG IN THE MORNING  1  . ergocalciferol (VITAMIN D2) 1.25 MG (50000 UT) capsule Vitamin D2 1,250 mcg (50,000 unit)  capsule  TAKE ONE TABLET ONCE WEEKLY    . estradiol (CLIMARA - DOSED IN MG/24 HR) 0.05 mg/24hr patch Place 0.05 mg onto the skin. Twice weekly    . estradiol (ESTRACE) 0.5 MG tablet     . fluconazole (DIFLUCAN) 150 MG tablet     . fluticasone (FLONASE) 50 MCG/ACT nasal spray Place 1 spray into the nose daily.     Marland Kitchen gabapentin (NEURONTIN) 300 MG capsule Take 1 capsule by mouth at bedtime.     Marland Kitchen HYDROcodone-acetaminophen (NORCO) 10-325 MG tablet Take 1 tablet by mouth every 8 (eight) hours as needed for up to 5 days. 15 tablet 0  . HYDROcodone-acetaminophen (NORCO) 7.5-325 MG tablet     . lansoprazole (PREVACID) 30 MG capsule Take 30 mg by mouth daily.  2  . latanoprost (XALATAN) 0.005 % ophthalmic solution Place 1 drop into both eyes at bedtime.  11  .  levocetirizine (XYZAL) 5 MG tablet Take 5 mg by mouth every evening.    Marland Kitchen levothyroxine (SYNTHROID, LEVOTHROID) 112 MCG tablet TAKE 1 TABLET BY MOUTH EVERY DAY ON AN EMPTY STOMACH WITH GLASS OF WATER  0  . losartan (COZAAR) 100 MG tablet Take 100 mg by mouth daily.    . methocarbamol (ROBAXIN) 750 MG tablet Take 750 mg by mouth daily as needed.   0  . metoprolol succinate (TOPROL-XL) 50 MG 24 hr tablet One tablet by mouth once daily    . MINIVELLE 0.025 MG/24HR Apply 1 patch twice weekly as directed  3  . montelukast (SINGULAIR) 10 MG tablet Take 10 mg by mouth at bedtime.    Marland Kitchen NOVOLOG 100 UNIT/ML injection     . ondansetron (ZOFRAN) 4 MG tablet Take 1 tablet (4 mg total) by mouth 3 (three) times daily. (Patient taking differently: Take 4 mg by mouth 3 (three) times daily as needed. ) 90 tablet 3  . pravastatin (PRAVACHOL) 20 MG tablet Take 1 tablet by mouth at bedtime.    . pregabalin (LYRICA) 75 MG capsule TAKE 1 CAPSULE BY MOUTH THREE TIMES A DAY    . PROAIR HFA 108 (90 BASE) MCG/ACT inhaler Inhale 2 puffs into the lungs every 4 (four) hours as needed.  11  . promethazine (PHENERGAN) 25 MG tablet Take 1 tablet by mouth. Every 4-6 hours as needed    . ranitidine (ZANTAC) 300 MG tablet TAKE 1 TABLET BY MOUTH EVERYDAY AT BEDTIME  3  . Vitamin D, Ergocalciferol, (DRISDOL) 50000 UNITS CAPS Take 50,000 Units by mouth every 7 (seven) days.     No facility-administered medications prior to visit.     PAST MEDICAL HISTORY: Past Medical History:  Diagnosis Date  . Anxiety   . Depression   . Diabetes mellitus ([place])   . Esophageal stricture   . Gastroparesis   . GERD (gastroesophageal reflux disease)   . Graves disease 02/2010   diagnosed by radioactive iodine uptake and scan   . Helicobacter pylori infection   . Hyperlipidemia   . Hypertension   . Insulin pump in place   . Nephrolithiasis 2010   rt sided per GI  . Neuropathy    diabetic neuropathy  . Osteoporosis   . Vitamin D  deficiency     PAST SURGICAL HISTORY: Past Surgical History:  Procedure Laterality Date  . BREAST EXCISIONAL BIOPSY    . BREAST LUMPECTOMY     LEFT-BENIGN  . NASAL SEPTOPLASTY W/ TURBINOPLASTY Bilateral 09/07/2013   Procedure: SEPTOPLASTY, BILATERAL TURBINATE RESECTION ;  Surgeon:  Ascencion Dike, MD;  Location: [place];  Service: ENT;  Laterality: Bilateral;  . UPPER GASTROINTESTINAL ENDOSCOPY    . VAGINAL HYSTERECTOMY  2006    FAMILY HISTORY: Family History  Problem Relation Age of Onset  . Diabetes Father   . Heart attack Father   . Alcoholism Father   . Cancer Mother   . Colon cancer Neg Hx   . Esophageal cancer Neg Hx   . Rectal cancer Neg Hx   . Stomach cancer Neg Hx     SOCIAL HISTORY: Social History   Socioeconomic History  . Marital status: Married    Spouse name: Not on file  . Number of children: Not on file  . Years of education: Not on file  . Highest education level: Not on file  Occupational History  . Occupation: Occupational psychologist  Social Needs  . Financial resource strain: Not on file  . Food insecurity    Worry: Not on file    Inability: Not on file  . Transportation needs    Medical: Not on file    Non-medical: Not on file  Tobacco Use  . Smoking status: Current Some Day Smoker    Packs/day: 0.50    Years: 25.00    Pack years: 12.50    Types: Cigarettes  . Smokeless tobacco: Never Used  . Tobacco comment: 6-8 CIGS A DAY  Substance and Sexual Activity  . Alcohol use: No    Alcohol/week: 0.0 standard drinks  . Drug use: No  . Sexual activity: Not on file  Lifestyle  . Physical activity    Days per week: Not on file    Minutes per session: Not on file  . Stress: Not on file  Relationships  . Social Herbalist on phone: Not on file    Gets together: Not on file    Attends religious service: Not on file    Active member of club or organization: Not on file    Attends meetings of clubs or organizations: Not on file     Relationship status: Not on file  . Intimate partner violence    Fear of current or ex partner: Not on file    Emotionally abused: Not on file    Physically abused: Not on file    Forced sexual activity: Not on file  Other Topics Concern  . Not on file  Social History Narrative   Lives home with husband.  Education 12th grade.  Caffeine 4-5 drinks daily.      PHYSICAL EXAM  Vitals:   12/11/18 1243  BP: 118/65  Pulse: 81  Temp: 98.4 F (36.9 C)  TempSrc: Oral  Weight: 150 lb 9.6 oz (68.3 kg)  Height: 4' 11.5" (1.511 m)   Body mass index is 29.91 kg/m.  Generalized: Well developed, in no acute distress  Cardiology: normal rate and rhythm, no murmur noted Respiratory: Clear to auscultation bilaterally Neck circumference 16.25 inches Neurological examination  Mentation: Alert oriented to time, place, history taking. Follows all commands speech and language fluent Cranial nerve II-XII: Pupils were equal round reactive to light. Extraocular movements were full, visual field were full on confrontational test. Facial sensation and strength were normal. Uvula tongue midline. Head turning and shoulder shrug  were normal and symmetric. Motor: The motor testing reveals 5 over 5 strength of all 4 extremities. Good symmetric motor tone is noted throughout.  Gait and station: Gait is abnormal, patient limping due to left  foot pain   DIAGNOSTIC DATA (LABS, IMAGING, TESTING) - I reviewed patient records, labs, notes, testing and imaging myself where available.  No flowsheet data found.   Lab Results  Component Value Date   WBC 18.7 (H) 01/29/2017   HGB 12.9 01/29/2017   HCT 38.2 01/29/2017   MCV 84.1 01/29/2017   PLT 376 01/29/2017      Component Value Date/Time   NA 138 01/29/2017 1512   K 3.8 01/29/2017 1512   CL 104 01/29/2017 1512   CO2 24 01/29/2017 1512   GLUCOSE 247 (H) 01/29/2017 1512   BUN 17 01/29/2017 1512   CREATININE 0.74 01/29/2017 1512   CALCIUM 9.1  01/29/2017 1512   PROT 6.3 01/07/2010 1646   ALBUMIN 4.0 01/07/2010 1646   AST 14 01/07/2010 1646   ALT 19 01/07/2010 1646   ALKPHOS 73 01/07/2010 1646   BILITOT 1.0 01/07/2010 1646   GFRNONAA >60 01/29/2017 1512   GFRAA >60 01/29/2017 1512   No results found for: CHOL, HDL, LDLCALC, LDLDIRECT, TRIG, CHOLHDL No results found for: HGBA1C No results found for: VITAMINB12 Lab Results  Component Value Date   TSH 0.005 (L) 01/07/2010    ASSESSMENT AND PLAN 53 y.o. year old female  has a past medical history of Anxiety, Depression, Diabetes mellitus ([place]), Esophageal stricture, Gastroparesis, GERD (gastroesophageal reflux disease), Graves disease (21/1941), Helicobacter pylori infection, Hyperlipidemia, Hypertension, Insulin pump in place, Nephrolithiasis (2010), Neuropathy, Osteoporosis, and Vitamin D deficiency. here with     ICD-10-CM   1. OSA on CPAP  G47.33    Z99.89     [place] is doing much better since receiving new mask and headgear.  Week has been corrected on updated compliance report.  She is using her CPAP every night.  Greater than 4 hours usage is at 77%.  I have encouraged her to continue using CPAP nightly and for greater than 4 hours each night.  Risk of untreated sleep apnea discussed.  Educational materials provided in AVS.  I have advised one-year follow-up, sooner if needed.  She verbalizes understanding and agreement with this plan.   No orders of the defined types were placed in this encounter.    No orders of the defined types were placed in this encounter.     I spent 15 minutes with the patient. 50% of this time was spent counseling and educating patient on plan of care and medications.    Debbora Presto, FNP-C 12/11/2018, 1:16 PM [facility] [address], [place], [place] [address] [phone]

## 2018-12-11 NOTE — Patient Instructions (Signed)
Continue CPAP nightly and for greater than 4 hours each night.   Follow up annually, sooner if needed  Sleep Apnea Sleep apnea affects breathing during sleep. It causes breathing to stop for a short time or to become shallow. It can also increase the risk of:  Heart attack.  Stroke.  Being very overweight (obese).  Diabetes.  Heart failure.  Irregular heartbeat. The goal of treatment is to help you breathe normally again. What are the causes? There are three kinds of sleep apnea:  Obstructive sleep apnea. This is caused by a blocked or collapsed airway.  Central sleep apnea. This happens when the brain does not send the right signals to the muscles that control breathing.  Mixed sleep apnea. This is a combination of obstructive and central sleep apnea. The most common cause of this condition is a collapsed or blocked airway. This can happen if:  Your throat muscles are too relaxed.  Your tongue and tonsils are too large.  You are overweight.  Your airway is too small. What increases the risk?  Being overweight.  Smoking.  Having a small airway.  Being older.  Being female.  Drinking alcohol.  Taking medicines to calm yourself (sedatives or tranquilizers).  Having family members with the condition. What are the signs or symptoms?  Trouble staying asleep.  Being sleepy or tired during the day.  Getting angry a lot.  Loud snoring.  Headaches in the morning.  Not being able to focus your mind (concentrate).  Forgetting things.  Less interest in sex.  Mood swings.  Personality changes.  Feelings of sadness (depression).  Waking up a lot during the night to pee (urinate).  Dry mouth.  Sore throat. How is this diagnosed?  Your medical history.  A physical exam.  A test that is done when you are sleeping (sleep study). The test is most often done in a sleep lab but may also be done at home. How is this treated?   Sleeping on your side.   Using a medicine to get rid of mucus in your nose (decongestant).  Avoiding the use of alcohol, medicines to help you relax, or certain pain medicines (narcotics).  Losing weight, if needed.  Changing your diet.  Not smoking.  Using a machine to open your airway while you sleep, such as: ? An oral appliance. This is a mouthpiece that shifts your lower jaw forward. ? A CPAP device. This device blows air through a mask when you breathe out (exhale). ? An EPAP device. This has valves that you put in each nostril. ? A BPAP device. This device blows air through a mask when you breathe in (inhale) and breathe out.  Having surgery if other treatments do not work. It is important to get treatment for sleep apnea. Without treatment, it can lead to:  High blood pressure.  Coronary artery disease.  In men, not being able to have an erection (impotence).  Reduced thinking ability. Follow these instructions at home: Lifestyle  Make changes that your doctor recommends.  Eat a healthy diet.  Lose weight if needed.  Avoid alcohol, medicines to help you relax, and some pain medicines.  Do not use any products that contain nicotine or tobacco, such as cigarettes, e-cigarettes, and chewing tobacco. If you need help quitting, ask your doctor. General instructions  Take over-the-counter and prescription medicines only as told by your doctor.  If you were given a machine to use while you sleep, use it only as told  by your doctor.  If you are having surgery, make sure to tell your doctor you have sleep apnea. You may need to bring your device with you.  Keep all follow-up visits as told by your doctor. This is important. Contact a doctor if:  The machine that you were given to use during sleep bothers you or does not seem to be working.  You do not get better.  You get worse. Get help right away if:  Your chest hurts.  You have trouble breathing in enough air.  You have an  uncomfortable feeling in your back, arms, or stomach.  You have trouble talking.  One side of your body feels weak.  A part of your face is hanging down. These symptoms may be an emergency. Do not wait to see if the symptoms will go away. Get medical help right away. Call your local emergency services (911 in the [place] Do not drive yourself to the hospital. Summary  This condition affects breathing during sleep.  The most common cause is a collapsed or blocked airway.  The goal of treatment is to help you breathe normally while you sleep. This information is not intended to replace advice given to you by your health care provider. Make sure you discuss any questions you have with your health care provider. Document Released: 03/06/2008 Document Revised: 03/14/2018 Document Reviewed: 01/21/2018 Elsevier Patient Education  [place]. CPAP and BPAP Information CPAP and BPAP are methods of helping a person breathe with the use of air pressure. CPAP stands for "continuous positive airway pressure." BPAP stands for "bi-level positive airway pressure." In both methods, air is blown through your nose or mouth and into your air passages to help you breathe well. CPAP and BPAP use different amounts of pressure to blow air. With CPAP, the amount of pressure stays the same while you breathe in and out. With BPAP, the amount of pressure is increased when you breathe in (inhale) so that you can take larger breaths. Your health care provider will recommend whether CPAP or BPAP would be more helpful for you. Why are CPAP and BPAP treatments used? CPAP or BPAP can be helpful if you have:  Sleep apnea.  Chronic obstructive pulmonary disease (COPD).  Heart failure.  Medical conditions that weaken the muscles of the chest including muscular dystrophy, or neurological diseases such as amyotrophic lateral sclerosis (ALS).  Other problems that cause breathing to be weak, abnormal, or difficult.  CPAP is most commonly used for obstructive sleep apnea (OSA) to keep the airways from collapsing when the muscles relax during sleep. How is CPAP or BPAP administered? Both CPAP and BPAP are provided by a small machine with a flexible plastic tube that attaches to a plastic mask. You wear the mask. Air is blown through the mask into your nose or mouth. The amount of pressure that is used to blow the air can be adjusted on the machine. Your health care provider will determine the pressure setting that should be used based on your individual needs. When should CPAP or BPAP be used? In most cases, the mask only needs to be worn during sleep. Generally, the mask needs to be worn throughout the night and during any daytime naps. People with certain medical conditions may also need to wear the mask at other times when they are awake. Follow instructions from your health care provider about when to use the machine. What are some tips for using the mask?   Because the  mask needs to be snug, some people feel trapped or closed-in (claustrophobic) when first using the mask. If you feel this way, you may need to get used to the mask. One way to do this is by holding the mask loosely over your nose or mouth and then gradually applying the mask more snugly. You can also gradually increase the amount of time that you use the mask.  Masks are available in various types and sizes. Some fit over your mouth and nose while others fit over just your nose. If your mask does not fit well, talk with your health care provider about getting a different one.  If you are using a mask that fits over your nose and you tend to breathe through your mouth, a chin strap may be applied to help keep your mouth closed.  The CPAP and BPAP machines have alarms that may sound if the mask comes off or develops a leak.  If you have trouble with the mask, it is very important that you talk with your health care provider about finding a way  to make the mask easier to tolerate. Do not stop using the mask. Stopping the use of the mask could have a negative impact on your health. What are some tips for using the machine?  Place your CPAP or BPAP machine on a secure table or stand near an electrical outlet.  Know where the on/off switch is located on the machine.  Follow instructions from your health care provider about how to set the pressure on your machine and when you should use it.  Do not eat or drink while the CPAP or BPAP machine is on. Food or fluids could get pushed into your lungs by the pressure of the CPAP or BPAP.  Do not smoke. Tobacco smoke residue can damage the machine.  For home use, CPAP and BPAP machines can be rented or purchased through home health care companies. Many different brands of machines are available. Renting a machine before purchasing may help you find out which particular machine works well for you.  Keep the CPAP or BPAP machine and attachments clean. Ask your health care provider for specific instructions. Get help right away if:  You have redness or open areas around your nose or mouth where the mask fits.  You have trouble using the CPAP or BPAP machine.  You cannot tolerate wearing the CPAP or BPAP mask.  You have pain, discomfort, and bloating in your abdomen. Summary  CPAP and BPAP are methods of helping a person breathe with the use of air pressure.  Both CPAP and BPAP are provided by a small machine with a flexible plastic tube that attaches to a plastic mask.  If you have trouble with the mask, it is very important that you talk with your health care provider about finding a way to make the mask easier to tolerate. This information is not intended to replace advice given to you by your health care provider. Make sure you discuss any questions you have with your health care provider. Document Released: 02/24/2004 Document Revised: 09/17/2018 Document Reviewed: 04/16/2016  Elsevier Patient Education  2020 [employer].

## 2018-12-16 ENCOUNTER — Other Ambulatory Visit: Payer: Self-pay

## 2018-12-16 ENCOUNTER — Ambulatory Visit (HOSPITAL_COMMUNITY)
Admission: RE | Admit: 2018-12-16 | Discharge: 2018-12-16 | Disposition: A | Discharge status: Home / Self Care | Payer: Medicare HMO | Source: Ambulatory Visit | Attending: Cardiology | Admitting: Cardiology

## 2018-12-16 DIAGNOSIS — H0520 Unspecified exophthalmos: Secondary | ICD-10-CM | POA: Diagnosis not present

## 2018-12-16 DIAGNOSIS — H16223 Keratoconjunctivitis sicca, not specified as Sjogren's, bilateral: Secondary | ICD-10-CM | POA: Diagnosis not present

## 2018-12-16 DIAGNOSIS — M79671 Pain in right foot: Secondary | ICD-10-CM | POA: Diagnosis not present

## 2018-12-16 DIAGNOSIS — H25813 Combined forms of age-related cataract, bilateral: Secondary | ICD-10-CM | POA: Diagnosis not present

## 2018-12-16 DIAGNOSIS — E109 Type 1 diabetes mellitus without complications: Secondary | ICD-10-CM | POA: Diagnosis not present

## 2018-12-16 DIAGNOSIS — R0989 Other specified symptoms and signs involving the circulatory and respiratory systems: Secondary | ICD-10-CM | POA: Diagnosis not present

## 2018-12-16 DIAGNOSIS — H40053 Ocular hypertension, bilateral: Secondary | ICD-10-CM | POA: Diagnosis not present

## 2018-12-16 DIAGNOSIS — E0500 Thyrotoxicosis with diffuse goiter without thyrotoxic crisis or storm: Secondary | ICD-10-CM | POA: Diagnosis not present

## 2018-12-23 DIAGNOSIS — E1040 Type 1 diabetes mellitus with diabetic neuropathy, unspecified: Secondary | ICD-10-CM | POA: Diagnosis not present

## 2018-12-24 ENCOUNTER — Telehealth: Payer: Self-pay | Admitting: Cardiovascular Disease

## 2018-12-24 NOTE — Telephone Encounter (Signed)
I called pt to confirm her appt with Dr Gwenlyn Found on 12-25-18.         COVID-19 Pre-Screening Questions:   In the past 7 to 10 days have you had a cough,  shortness of breath, headache, congestion, fever (100 or greater) body aches, chills, sore throat, or sudden loss of taste or sense of smell? no  Have you been around anyone with known Covid 19.  Have you been around anyone who is awaiting Covid 19 test results in the past 7 to 10 days? no Have you been around anyone who has been exposed to Covid 19, or has mentioned symptoms of Covid 19 within the past 7 to 10 days?  no If you have any concerns/questions  about symptoms patients report during screening (either on the phone or at threshold). Contact the provider seeing the patient or DOD for further guidance.  If neither are available contact a member of the leadership team.

## 2018-12-25 ENCOUNTER — Encounter: Payer: Self-pay | Admitting: Cardiovascular Disease

## 2018-12-25 ENCOUNTER — Ambulatory Visit (INDEPENDENT_AMBULATORY_CARE_PROVIDER_SITE_OTHER): Payer: Medicare HMO | Admitting: Cardiovascular Disease

## 2018-12-25 ENCOUNTER — Other Ambulatory Visit: Payer: Self-pay

## 2018-12-25 VITALS — BP 142/78 | HR 114 | Temp 98.4°F | Ht 59.5 in | Wt 152.0 lb

## 2018-12-25 DIAGNOSIS — I739 Peripheral vascular disease, unspecified: Secondary | ICD-10-CM

## 2018-12-25 DIAGNOSIS — Z01812 Encounter for preprocedural laboratory examination: Secondary | ICD-10-CM

## 2018-12-25 DIAGNOSIS — E782 Mixed hyperlipidemia: Secondary | ICD-10-CM

## 2018-12-25 DIAGNOSIS — R0600 Dyspnea, unspecified: Secondary | ICD-10-CM | POA: Diagnosis not present

## 2018-12-25 DIAGNOSIS — I10 Essential (primary) hypertension: Secondary | ICD-10-CM

## 2018-12-25 DIAGNOSIS — E785 Hyperlipidemia, unspecified: Secondary | ICD-10-CM | POA: Insufficient documentation

## 2018-12-25 MED ORDER — ASPIRIN EC 81 MG PO TBEC: 81.0000 mg | DELAYED_RELEASE_TABLET | Freq: Once | ORAL | 0 refills | Status: AC

## 2018-12-25 NOTE — Progress Notes (Signed)
12/25/2018 Ashley [name]   1965/11/28  [id number]  Primary Physician Prince Solian, MD Primary Cardiologist: Lorretta Harp MD [name], [place]  HPI:  Ashley [name] is a 53 y.o. moderately overweight married Caucasian female mother of 1 son, grandmother to one grandchild referred by Dr. Paulla Dolly for peripheral vascular valuation because of poorly palpable pulses and leg pain.  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  She has a history of ongoing tobacco abuse of 4 cigarettes a day, treated hypertension, diabetes and hyperlipidemia.  Her father died of a myocardial infarction at age 82.  She is never had a heart attack or stroke and denies chest pain or shortness of breath.  She has been on insulin pump for last 26 years.  She has had bilateral lower extremity lifestyle and claudication for last 2 years with recent lower extremity Dopplers performed in our office 12/16/2018 revealing a right ABI 0.64 and a left 0.55.  She had high-grade bilateral iliac disease.   Current Meds  Medication Sig  . ACCU-CHEK COMPACT PLUS test strip 1 each by Other route as needed.   Marland Kitchen ACCU-CHEK SOFTCLIX LANCETS lancets   . albuterol (PROVENTIL) (2.5 MG/3ML) 0.083% nebulizer solution Use every 2 hours as needed for severe wheezing for the next 3 days then decrease to every 4-6 hours  . ALPRAZolam (XANAX) 0.5 MG tablet Take 0.5 mg by mouth 3 (three) times daily as needed.   . AMBULATORY NON FORMULARY MEDICATION NOVOLOG INSULIN PUMP USES AS DIRECTED  . amLODipine (NORVASC) 5 MG tablet Take 5 mg by mouth daily.  . Azelastine HCl 0.15 % SOLN Place 1 spray into the nose daily.  . cyanocobalamin (,VITAMIN B-12,) 1000 MCG/ML injection INJECT 1 MILLILITER INTO THE MUSCLE ONCE A WEEK FOR 4 WEEKS THEN 1 MILLILITER ONCE A MONTH THERAFTER  . DULoxetine (CYMBALTA) 30 MG capsule TAKE ONE CAPSULE DAILY IN ADDITION TO 60 MG IN THE MORNING  . fluconazole (DIFLUCAN) 150  MG tablet   . fluticasone (FLONASE) 50 MCG/ACT nasal spray Place 1 spray into the nose daily.   Marland Kitchen gabapentin (NEURONTIN) 300 MG capsule Take 1 capsule by mouth at bedtime.   . lansoprazole (PREVACID) 30 MG capsule Take 30 mg by mouth daily.  Marland Kitchen latanoprost (XALATAN) 0.005 % ophthalmic solution Place 1 drop into both eyes at bedtime.  Marland Kitchen levocetirizine (XYZAL) 5 MG tablet Take 5 mg by mouth every evening.  Marland Kitchen levothyroxine (SYNTHROID, LEVOTHROID) 112 MCG tablet TAKE 1 TABLET BY MOUTH EVERY DAY ON AN EMPTY STOMACH WITH GLASS OF WATER  . losartan (COZAAR) 100 MG tablet Take 100 mg by mouth daily.  . methocarbamol (ROBAXIN) 750 MG tablet Take 750 mg by mouth daily as needed.   . metoprolol succinate (TOPROL-XL) 50 MG 24 hr tablet One tablet by mouth once daily  . MINIVELLE 0.025 MG/24HR Apply 1 patch twice weekly as directed  . montelukast (SINGULAIR) 10 MG tablet Take 10 mg by mouth at bedtime.  Marland Kitchen NOVOLOG 100 UNIT/ML injection   . pregabalin (LYRICA) 75 MG capsule TAKE 1 CAPSULE BY MOUTH THREE TIMES A DAY  . PROAIR HFA 108 (90 BASE) MCG/ACT inhaler Inhale 2 puffs into the lungs every 4 (four) hours as needed.  . promethazine (PHENERGAN) 25 MG tablet Take 1 tablet by mouth. Every 4-6 hours as needed  . Vitamin D, Ergocalciferol, (DRISDOL) 50000 UNITS CAPS Take 50,000 Units by mouth every 7 (  seven) days.  . [DISCONTINUED] ergocalciferol (VITAMIN D2) 1.25 MG (50000 UT) capsule Vitamin D2 1,250 mcg (50,000 unit) capsule  TAKE ONE TABLET ONCE WEEKLY  . [DISCONTINUED] estradiol (CLIMARA - DOSED IN MG/24 HR) 0.05 mg/24hr patch Place 0.05 mg onto the skin. Twice weekly  . [DISCONTINUED] estradiol (ESTRACE) 0.5 MG tablet   . [DISCONTINUED] HYDROcodone-acetaminophen (NORCO) 7.5-325 MG tablet   . [DISCONTINUED] ondansetron (ZOFRAN) 4 MG tablet Take 1 tablet (4 mg total) by mouth 3 (three) times daily. (Patient taking differently: Take 4 mg by mouth 3 (three) times daily as needed. )  . [DISCONTINUED]  pravastatin (PRAVACHOL) 20 MG tablet Take 1 tablet by mouth at bedtime.  . [DISCONTINUED] ranitidine (ZANTAC) 300 MG tablet TAKE 1 TABLET BY MOUTH EVERYDAY AT BEDTIME     Allergies  Allergen Reactions  . Clindamycin Hives  . Other Hives  . Sulfonamide Derivatives Hives  . Lipitor [Atorvastatin] Other (See Comments)    MUSCLE ACHES  . Restasis [Cyclosporine]     Pt stated, "My eyes turned red on the inside and outside of eye; burning sensation"  . Topamax [Topiramate] Itching    Social History   Socioeconomic History  . Marital status: Married    Spouse name: Not on file  . Number of children: Not on file  . Years of education: Not on file  . Highest education level: Not on file  Occupational History  . Occupation: Occupational psychologist  Social Needs  . Financial resource strain: Not on file  . Food insecurity    Worry: Not on file    Inability: Not on file  . Transportation needs    Medical: Not on file    Non-medical: Not on file  Tobacco Use  . Smoking status: Current Some Day Smoker    Packs/day: 0.50    Years: 25.00    Pack years: 12.50    Types: Cigarettes  . Smokeless tobacco: Never Used  . Tobacco comment: 6-8 CIGS A DAY  Substance and Sexual Activity  . Alcohol use: No    Alcohol/week: 0.0 standard drinks  . Drug use: No  . Sexual activity: Not on file  Lifestyle  . Physical activity    Days per week: Not on file    Minutes per session: Not on file  . Stress: Not on file  Relationships  . Social Herbalist on phone: Not on file    Gets together: Not on file    Attends religious service: Not on file    Active member of club or organization: Not on file    Attends meetings of clubs or organizations: Not on file    Relationship status: Not on file  . Intimate partner violence    Fear of current or ex partner: Not on file    Emotionally abused: Not on file    Physically abused: Not on file    Forced sexual activity: Not on file  Other Topics  Concern  . Not on file  Social History Narrative   Lives home with husband.  Education 12th grade.  Caffeine 4-5 drinks daily.     Review of Systems: General: negative for chills, fever, night sweats or weight changes.  Cardiovascular: negative for chest pain, dyspnea on exertion, edema, orthopnea, palpitations, paroxysmal nocturnal dyspnea or shortness of breath Dermatological: negative for rash Respiratory: negative for cough or wheezing Urologic: negative for hematuria Abdominal: negative for nausea, vomiting, diarrhea, bright red blood per rectum, melena, or hematemesis Neurologic: negative  for visual changes, syncope, or dizziness All other systems reviewed and are otherwise negative except as noted above.    Blood pressure (!) 142/78, pulse (!) 114, temperature 98.4 F (36.9 C), height 4' 11.5" (1.511 m), weight 152 lb (68.9 kg), SpO2 94 %.  General appearance: alert and no distress Neck: no adenopathy, no carotid bruit, no JVD, supple, symmetrical, trachea midline and thyroid not enlarged, symmetric, no tenderness/mass/nodules Lungs: clear to auscultation bilaterally Heart: regular rate and rhythm, S1, S2 normal, no murmur, click, rub or gallop Extremities: extremities normal, atraumatic, no cyanosis or edema Pulses: Diminished pulses bilaterally Skin: Skin color, texture, turgor normal. No rashes or lesions Neurologic: Alert and oriented X 3, normal strength and tone. Normal symmetric reflexes. Normal coordination and gait  EKG sinus tachycardia 102 without ST or T wave changes.  I personally reviewed this EKG.  ASSESSMENT AND PLAN:   Essential hypertension History of essential hypertension with blood pressure measured today at 142/78.  She is on amlodipine, metoprolol and losartan.  Hyperlipidemia History of hyperlipidemia on Crestor followed by her PCP.  Her most recent lipid profile performed 09/17/2018 revealed a total cholesterol 202, LDL 131 and HDL 44.  She may be a  candidate for [place]atha.  Peripheral arterial disease ([place]) Ms. Koplin was referred to me by Dr. Paulla Dolly for PAD.  She has had lifestyle limiting claudication for 2 years.  Recent Dopplers performed in our office 12/16/2018 revealed a right ABI 0.64 and a left ABI 0.55 with what appears to be high-grade bilateral iliac stenosis.  She will require angiography and potential endovascular therapy for lifestyle limiting claudication.      Lorretta Harp MD FACP,FACC,FAHA, [facility] 12/25/2018 2:49 PM

## 2018-12-25 NOTE — Patient Instructions (Addendum)
    [facility] [employer] [place] [address] Dept: [phone] Loc: [place]  12/25/2018  You are scheduled for a Peripheral Angiogram on Thursday, July 23 with Dr. Quay Burow.  1. Please arrive at the [facility] (Main Entrance A) at [facility]: [address] [place], [place] [address] at 5:30 AM (This time is two hours before your procedure to ensure your preparation). Free valet parking service is available.   Special note: Every effort is made to have your procedure done on time. Please understand that emergencies sometimes delay scheduled procedures.  2. Diet: Do not eat solid foods after midnight.  The patient may have clear liquids until 5am upon the day of the procedure.  3. Labs: You will need to have blood drawn today:  Go to:  [facility] at [address] [address], [place], [place] [address] FOR YOUR BASIC METABOLIC PANEL, COMPLETE BLOOD COUNT, AND THYROID STIMULATING HORMONE LAB WORK. NO APPOINTMENT IS NEEDED. YOU MUST HAVE THIS LAB WORK DONE BEFORE GOING TO GET YOUR COVID-19 TEST DONE BECAUSE YOU WILL NEED TO QUARANTINE YOURSELF AFTER THE COVID-19 TEST UNTIL THE DAY OF YOUR [place].  Go to: [facility]  [address] [place], [place] [address] FOR YOUR COVID-19 TEST. YOU MUST HAVE YOUR COVID-19 TEST COMPLETED 3 DAYS PRIOR TO YOUR UPCOMING PROCEDURE/TEST. YOU HAVE AN APPOINTMENT ON 12/29/2018 AT 2:30PM. YOU WILL ALSO NEED TO QUARANTINE YOURSELF AFTER THE COVID-19 TEST UNTIL THE DAY OF YOUR PROCEDURE/TEST. RESULTS WILL  BE POSTED IN OUR SYSTEM IN 48 HOURS OR LESS.    4. Medication instructions in preparation for your procedure:  DO NOT TAKE YOUR NOVOLOG ON THE MORNING OF THE PROCEDURE  On the morning of your procedure, take your Aspirin and any morning medicines NOT listed  above.  You may use sips of water.   5. Plan for one night stay--bring personal belongings. 6. Bring a current list of your medications and current insurance cards. 7. You MUST have a responsible person to drive you home. 8. Someone MUST be with you the first 24 hours after you arrive home or your discharge will be delayed. 9. Please wear clothes that are easy to get on and off and wear slip-on shoes.   TESTS: Your physician has requested that you have an aorta/iliac duplex. During this test, an ultrasound is used to evaluate blood flow to the aorta and iliac arteries. Allow one hour for this exam. Do not eat after midnight the day before and avoid carbonated beverages. TO BE SCHEDULED FOR 1 WEEK AFTER YOUR PROCEDURE  Your physician has requested that you have an ankle brachial index (ABI). During this test an ultrasound and blood pressure cuff are used to evaluate the arteries that supply the arms and legs with blood. Allow thirty minutes for this exam. There are no restrictions or special instructions. TO BE SCHEDULE FOR 1 WEEK AFTER YOUR PROCEDURE   FOLLOW UP:  PLEASE FOLLOW UP WITH DR. Gwenlyn Found 2-3 WEEKS AFTER YOUR PROCEDURE.  Thank you for allowing [place] to care for you!   -- [place] [facility]

## 2018-12-25 NOTE — H&P (View-Only) (Signed)
12/25/2018 Ashley [name]   06-18-1965  [id number]  Primary Physician Prince Solian, MD Primary Cardiologist: Lorretta Harp MD [name], [place]  HPI:  Ashley [name] is a 53 y.o. moderately overweight married Caucasian female mother of 1 son, grandmother to one grandchild referred by Dr. Paulla Dolly for peripheral vascular valuation because of poorly palpable pulses and leg pain.  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  She has a history of ongoing tobacco abuse of 4 cigarettes a day, treated hypertension, diabetes and hyperlipidemia.  Her father died of a myocardial infarction at age 21.  She is never had a heart attack or stroke and denies chest pain or shortness of breath.  She has been on insulin pump for last 26 years.  She has had bilateral lower extremity lifestyle and claudication for last 2 years with recent lower extremity Dopplers performed in our office 12/16/2018 revealing a right ABI 0.64 and a left 0.55.  She had high-grade bilateral iliac disease.   Current Meds  Medication Sig  . ACCU-CHEK COMPACT PLUS test strip 1 each by Other route as needed.   Marland Kitchen ACCU-CHEK SOFTCLIX LANCETS lancets   . albuterol (PROVENTIL) (2.5 MG/3ML) 0.083% nebulizer solution Use every 2 hours as needed for severe wheezing for the next 3 days then decrease to every 4-6 hours  . ALPRAZolam (XANAX) 0.5 MG tablet Take 0.5 mg by mouth 3 (three) times daily as needed.   . AMBULATORY NON FORMULARY MEDICATION NOVOLOG INSULIN PUMP USES AS DIRECTED  . amLODipine (NORVASC) 5 MG tablet Take 5 mg by mouth daily.  . Azelastine HCl 0.15 % SOLN Place 1 spray into the nose daily.  . cyanocobalamin (,VITAMIN B-12,) 1000 MCG/ML injection INJECT 1 MILLILITER INTO THE MUSCLE ONCE A WEEK FOR 4 WEEKS THEN 1 MILLILITER ONCE A MONTH THERAFTER  . DULoxetine (CYMBALTA) 30 MG capsule TAKE ONE CAPSULE DAILY IN ADDITION TO 60 MG IN THE MORNING  . fluconazole (DIFLUCAN) 150  MG tablet   . fluticasone (FLONASE) 50 MCG/ACT nasal spray Place 1 spray into the nose daily.   Marland Kitchen gabapentin (NEURONTIN) 300 MG capsule Take 1 capsule by mouth at bedtime.   . lansoprazole (PREVACID) 30 MG capsule Take 30 mg by mouth daily.  Marland Kitchen latanoprost (XALATAN) 0.005 % ophthalmic solution Place 1 drop into both eyes at bedtime.  Marland Kitchen levocetirizine (XYZAL) 5 MG tablet Take 5 mg by mouth every evening.  Marland Kitchen levothyroxine (SYNTHROID, LEVOTHROID) 112 MCG tablet TAKE 1 TABLET BY MOUTH EVERY DAY ON AN EMPTY STOMACH WITH GLASS OF WATER  . losartan (COZAAR) 100 MG tablet Take 100 mg by mouth daily.  . methocarbamol (ROBAXIN) 750 MG tablet Take 750 mg by mouth daily as needed.   . metoprolol succinate (TOPROL-XL) 50 MG 24 hr tablet One tablet by mouth once daily  . MINIVELLE 0.025 MG/24HR Apply 1 patch twice weekly as directed  . montelukast (SINGULAIR) 10 MG tablet Take 10 mg by mouth at bedtime.  Marland Kitchen NOVOLOG 100 UNIT/ML injection   . pregabalin (LYRICA) 75 MG capsule TAKE 1 CAPSULE BY MOUTH THREE TIMES A DAY  . PROAIR HFA 108 (90 BASE) MCG/ACT inhaler Inhale 2 puffs into the lungs every 4 (four) hours as needed.  . promethazine (PHENERGAN) 25 MG tablet Take 1 tablet by mouth. Every 4-6 hours as needed  . Vitamin D, Ergocalciferol, (DRISDOL) 50000 UNITS CAPS Take 50,000 Units by mouth every 7 (  seven) days.  . [DISCONTINUED] ergocalciferol (VITAMIN D2) 1.25 MG (50000 UT) capsule Vitamin D2 1,250 mcg (50,000 unit) capsule  TAKE ONE TABLET ONCE WEEKLY  . [DISCONTINUED] estradiol (CLIMARA - DOSED IN MG/24 HR) 0.05 mg/24hr patch Place 0.05 mg onto the skin. Twice weekly  . [DISCONTINUED] estradiol (ESTRACE) 0.5 MG tablet   . [DISCONTINUED] HYDROcodone-acetaminophen (NORCO) 7.5-325 MG tablet   . [DISCONTINUED] ondansetron (ZOFRAN) 4 MG tablet Take 1 tablet (4 mg total) by mouth 3 (three) times daily. (Patient taking differently: Take 4 mg by mouth 3 (three) times daily as needed. )  . [DISCONTINUED]  pravastatin (PRAVACHOL) 20 MG tablet Take 1 tablet by mouth at bedtime.  . [DISCONTINUED] ranitidine (ZANTAC) 300 MG tablet TAKE 1 TABLET BY MOUTH EVERYDAY AT BEDTIME     Allergies  Allergen Reactions  . Clindamycin Hives  . Other Hives  . Sulfonamide Derivatives Hives  . Lipitor [Atorvastatin] Other (See Comments)    MUSCLE ACHES  . Restasis [Cyclosporine]     Pt stated, "My eyes turned red on the inside and outside of eye; burning sensation"  . Topamax [Topiramate] Itching    Social History   Socioeconomic History  . Marital status: Married    Spouse name: Not on file  . Number of children: Not on file  . Years of education: Not on file  . Highest education level: Not on file  Occupational History  . Occupation: Occupational psychologist  Social Needs  . Financial resource strain: Not on file  . Food insecurity    Worry: Not on file    Inability: Not on file  . Transportation needs    Medical: Not on file    Non-medical: Not on file  Tobacco Use  . Smoking status: Current Some Day Smoker    Packs/day: 0.50    Years: 25.00    Pack years: 12.50    Types: Cigarettes  . Smokeless tobacco: Never Used  . Tobacco comment: 6-8 CIGS A DAY  Substance and Sexual Activity  . Alcohol use: No    Alcohol/week: 0.0 standard drinks  . Drug use: No  . Sexual activity: Not on file  Lifestyle  . Physical activity    Days per week: Not on file    Minutes per session: Not on file  . Stress: Not on file  Relationships  . Social Herbalist on phone: Not on file    Gets together: Not on file    Attends religious service: Not on file    Active member of club or organization: Not on file    Attends meetings of clubs or organizations: Not on file    Relationship status: Not on file  . Intimate partner violence    Fear of current or ex partner: Not on file    Emotionally abused: Not on file    Physically abused: Not on file    Forced sexual activity: Not on file  Other Topics  Concern  . Not on file  Social History Narrative   Lives home with husband.  Education 12th grade.  Caffeine 4-5 drinks daily.     Review of Systems: General: negative for chills, fever, night sweats or weight changes.  Cardiovascular: negative for chest pain, dyspnea on exertion, edema, orthopnea, palpitations, paroxysmal nocturnal dyspnea or shortness of breath Dermatological: negative for rash Respiratory: negative for cough or wheezing Urologic: negative for hematuria Abdominal: negative for nausea, vomiting, diarrhea, bright red blood per rectum, melena, or hematemesis Neurologic: negative  for visual changes, syncope, or dizziness All other systems reviewed and are otherwise negative except as noted above.    Blood pressure (!) 142/78, pulse (!) 114, temperature 98.4 F (36.9 C), height 4' 11.5" (1.511 m), weight 152 lb (68.9 kg), SpO2 94 %.  General appearance: alert and no distress Neck: no adenopathy, no carotid bruit, no JVD, supple, symmetrical, trachea midline and thyroid not enlarged, symmetric, no tenderness/mass/nodules Lungs: clear to auscultation bilaterally Heart: regular rate and rhythm, S1, S2 normal, no murmur, click, rub or gallop Extremities: extremities normal, atraumatic, no cyanosis or edema Pulses: Diminished pulses bilaterally Skin: Skin color, texture, turgor normal. No rashes or lesions Neurologic: Alert and oriented X 3, normal strength and tone. Normal symmetric reflexes. Normal coordination and gait  EKG sinus tachycardia 102 without ST or T wave changes.  I personally reviewed this EKG.  ASSESSMENT AND PLAN:   Essential hypertension History of essential hypertension with blood pressure measured today at 142/78.  She is on amlodipine, metoprolol and losartan.  Hyperlipidemia History of hyperlipidemia on Crestor followed by her PCP.  Her most recent lipid profile performed 09/17/2018 revealed a total cholesterol 202, LDL 131 and HDL 44.  She may be a  candidate for Repatha.  Peripheral arterial disease ([place]) Ashley [name] was referred to me by Dr. Paulla Dolly for PAD.  She has had lifestyle limiting claudication for 2 years.  Recent Dopplers performed in our office 12/16/2018 revealed a right ABI 0.64 and a left ABI 0.55 with what appears to be high-grade bilateral iliac stenosis.  She will require angiography and potential endovascular therapy for lifestyle limiting claudication.      Lorretta Harp MD FACP,FACC,FAHA, [facility] 12/25/2018 2:49 PM

## 2018-12-25 NOTE — Assessment & Plan Note (Signed)
History of essential hypertension with blood pressure measured today at 142/78.  She is on amlodipine, metoprolol and losartan.

## 2018-12-25 NOTE — Assessment & Plan Note (Signed)
History of hyperlipidemia on Crestor followed by her PCP.  Her most recent lipid profile performed 09/17/2018 revealed a total cholesterol 202, LDL 131 and HDL 44.  She may be a candidate for Repatha.

## 2018-12-25 NOTE — Assessment & Plan Note (Signed)
Ms. Benedicto was referred to me by Dr. Paulla Dolly for PAD.  She has had lifestyle limiting claudication for 2 years.  Recent Dopplers performed in our office 12/16/2018 revealed a right ABI 0.64 and a left ABI 0.55 with what appears to be high-grade bilateral iliac stenosis.  She will require angiography and potential endovascular therapy for lifestyle limiting claudication.

## 2018-12-26 DIAGNOSIS — M47816 Spondylosis without myelopathy or radiculopathy, lumbar region: Secondary | ICD-10-CM | POA: Diagnosis not present

## 2018-12-26 DIAGNOSIS — M5417 Radiculopathy, lumbosacral region: Secondary | ICD-10-CM | POA: Diagnosis not present

## 2018-12-26 LAB — BASIC METABOLIC PANEL
BUN/Creatinine Ratio: 8 — ABNORMAL LOW (ref 9–23)
BUN: 6 mg/dL (ref 6–24)
CO2: 19 mmol/L — ABNORMAL LOW (ref 20–29)
Calcium: 9.6 mg/dL (ref 8.7–10.2)
Chloride: 105 mmol/L (ref 96–106)
Creatinine, Ser: 0.71 mg/dL (ref 0.57–1.00)
GFR calc Af Amer: 112 mL/min/{1.73_m2} (ref >59)
GFR calc non Af Amer: 98 mL/min/{1.73_m2} (ref >59)
Glucose: 155 mg/dL — ABNORMAL HIGH (ref 65–99)
Potassium: 3.9 mmol/L (ref 3.5–5.2)
Sodium: 145 mmol/L — ABNORMAL HIGH (ref 134–144)

## 2018-12-26 LAB — CBC
Hematocrit: 38.8 % (ref 34.0–46.6)
Hemoglobin: 12.4 g/dL (ref 11.1–15.9)
MCH: 26.2 pg — ABNORMAL LOW (ref 26.6–33.0)
MCHC: 32 g/dL (ref 31.5–35.7)
MCV: 82 fL (ref 79–97)
Platelets: 388 10*3/uL (ref 150–450)
RBC: 4.74 x10E6/uL (ref 3.77–5.28)
RDW: 13.6 % (ref 11.7–15.4)
WBC: 13.3 10*3/uL — ABNORMAL HIGH (ref 3.4–10.8)

## 2018-12-26 LAB — TSH: TSH: 6.8 u[IU]/mL — ABNORMAL HIGH (ref 0.450–4.500)

## 2018-12-29 ENCOUNTER — Other Ambulatory Visit: Payer: Self-pay

## 2018-12-29 ENCOUNTER — Other Ambulatory Visit (HOSPITAL_COMMUNITY)
Admission: RE | Admit: 2018-12-29 | Discharge: 2018-12-29 | Disposition: A | Discharge status: Home / Self Care | Payer: Medicare HMO | Source: Ambulatory Visit | Attending: Cardiovascular Disease | Admitting: Cardiovascular Disease

## 2018-12-29 DIAGNOSIS — Z1159 Encounter for screening for other viral diseases: Secondary | ICD-10-CM | POA: Diagnosis not present

## 2018-12-30 DIAGNOSIS — I739 Peripheral vascular disease, unspecified: Secondary | ICD-10-CM | POA: Diagnosis not present

## 2018-12-30 DIAGNOSIS — K219 Gastro-esophageal reflux disease without esophagitis: Secondary | ICD-10-CM | POA: Diagnosis not present

## 2018-12-30 DIAGNOSIS — E1040 Type 1 diabetes mellitus with diabetic neuropathy, unspecified: Secondary | ICD-10-CM | POA: Diagnosis not present

## 2018-12-30 DIAGNOSIS — F17200 Nicotine dependence, unspecified, uncomplicated: Secondary | ICD-10-CM | POA: Diagnosis not present

## 2018-12-30 DIAGNOSIS — I10 Essential (primary) hypertension: Secondary | ICD-10-CM | POA: Diagnosis not present

## 2018-12-30 DIAGNOSIS — E785 Hyperlipidemia, unspecified: Secondary | ICD-10-CM | POA: Diagnosis not present

## 2018-12-30 LAB — SARS CORONAVIRUS 2 (TAT 6-24 HRS): SARS Coronavirus 2: NEGATIVE

## 2018-12-31 ENCOUNTER — Telehealth: Payer: Self-pay | Admitting: *Deleted

## 2018-12-31 ENCOUNTER — Other Ambulatory Visit: Payer: Self-pay

## 2018-12-31 DIAGNOSIS — I739 Peripheral vascular disease, unspecified: Secondary | ICD-10-CM

## 2018-12-31 NOTE — Telephone Encounter (Addendum)
Pt contacted pre-abdominal aortogram  scheduled at [facility] for: Thursday January 01, 2019 7:30 AM Verified arrival time and place: [place] Entrance A at: 5:30 AM  Covid-19 test date: 12/29/18  No solid food after midnight prior to cath, clear liquids until 5 AM day of procedure. Contrast allergy: no  Hold: Insulin pump-pt will manage based on glucose readings.  Except hold medications AM meds can be  taken pre-cath with sip of water including: ASA 81 mg   Confirm patient has responsible person to drive home post procedure and observe 24 hours after arriving home: yes  Due to Covid-19 pandemic, only one support person will be allowed with patient. Must be the same support person for that patient's entire stay. On arrival the support person will be required to wear a mask and will be screened, including having temporal temperature checked (below 100.4 to be cleared). They will be required to wait in the [facility] waiting room for the duration of the procedure. To limit exposure, MDs will continue to call support person after the procedure instead of speaking with them in consult room.   Patients are required to wear a mask when they enter the hospital.       COVID-19 Pre-Screening Questions:  . In the past 7 to 10 days have you had a cough,  shortness of breath, headache, congestion, fever (100 or greater) body aches, chills, sore throat, or sudden loss of taste or sense of smell? no . Have you been around anyone with known Covid 19? no . Have you been around anyone who is awaiting Covid 19 test results in the past 7 to 10 days? no . Have you been around anyone who has been exposed to Covid 19, or has mentioned symptoms of Covid 19 within the past 7 to 10 days? no     I reviewed procedure/mask/visitor instructions with patient, reviewed Covid-19 screening questions, pt verbalized understanding, thanked me for call.

## 2019-01-01 ENCOUNTER — Encounter (HOSPITAL_COMMUNITY): Payer: Self-pay | Admitting: Cardiovascular Disease

## 2019-01-01 ENCOUNTER — Ambulatory Visit (HOSPITAL_COMMUNITY)
Admission: RE | Admit: 2019-01-01 | Discharge: 2019-01-02 | Disposition: A | Discharge status: Home / Self Care | Payer: Medicare HMO | Source: Ambulatory Visit | Attending: Cardiovascular Disease | Admitting: Cardiovascular Disease

## 2019-01-01 ENCOUNTER — Other Ambulatory Visit: Payer: Self-pay

## 2019-01-01 ENCOUNTER — Encounter (HOSPITAL_COMMUNITY)
Admission: RE | Disposition: A | Discharge status: Home / Self Care | Payer: Medicare HMO | Source: Ambulatory Visit | Attending: Cardiovascular Disease

## 2019-01-01 DIAGNOSIS — Z79899 Other long term (current) drug therapy: Secondary | ICD-10-CM | POA: Insufficient documentation

## 2019-01-01 DIAGNOSIS — Z6830 Body mass index (BMI) 30.0-30.9, adult: Secondary | ICD-10-CM | POA: Diagnosis not present

## 2019-01-01 DIAGNOSIS — Z794 Long term (current) use of insulin: Secondary | ICD-10-CM | POA: Insufficient documentation

## 2019-01-01 DIAGNOSIS — I10 Essential (primary) hypertension: Secondary | ICD-10-CM | POA: Diagnosis not present

## 2019-01-01 DIAGNOSIS — Z882 Allergy status to sulfonamides status: Secondary | ICD-10-CM | POA: Diagnosis not present

## 2019-01-01 DIAGNOSIS — E663 Overweight: Secondary | ICD-10-CM | POA: Insufficient documentation

## 2019-01-01 DIAGNOSIS — F17210 Nicotine dependence, cigarettes, uncomplicated: Secondary | ICD-10-CM | POA: Insufficient documentation

## 2019-01-01 DIAGNOSIS — E785 Hyperlipidemia, unspecified: Secondary | ICD-10-CM | POA: Diagnosis present

## 2019-01-01 DIAGNOSIS — I700 Atherosclerosis of aorta: Secondary | ICD-10-CM | POA: Diagnosis not present

## 2019-01-01 DIAGNOSIS — Z79890 Hormone replacement therapy (postmenopausal): Secondary | ICD-10-CM | POA: Diagnosis not present

## 2019-01-01 DIAGNOSIS — Z9641 Presence of insulin pump (external) (internal): Secondary | ICD-10-CM | POA: Insufficient documentation

## 2019-01-01 DIAGNOSIS — E1151 Type 2 diabetes mellitus with diabetic peripheral angiopathy without gangrene: Secondary | ICD-10-CM | POA: Insufficient documentation

## 2019-01-01 DIAGNOSIS — I70213 Atherosclerosis of native arteries of extremities with intermittent claudication, bilateral legs: Secondary | ICD-10-CM | POA: Diagnosis not present

## 2019-01-01 DIAGNOSIS — Z7902 Long term (current) use of antithrombotics/antiplatelets: Secondary | ICD-10-CM | POA: Insufficient documentation

## 2019-01-01 DIAGNOSIS — G4733 Obstructive sleep apnea (adult) (pediatric): Secondary | ICD-10-CM

## 2019-01-01 DIAGNOSIS — Z8249 Family history of ischemic heart disease and other diseases of the circulatory system: Secondary | ICD-10-CM | POA: Diagnosis not present

## 2019-01-01 DIAGNOSIS — Z7982 Long term (current) use of aspirin: Secondary | ICD-10-CM | POA: Diagnosis not present

## 2019-01-01 DIAGNOSIS — Z881 Allergy status to other antibiotic agents status: Secondary | ICD-10-CM | POA: Insufficient documentation

## 2019-01-01 DIAGNOSIS — Z9989 Dependence on other enabling machines and devices: Secondary | ICD-10-CM

## 2019-01-01 DIAGNOSIS — I70313 Atherosclerosis of unspecified type of bypass graft(s) of the extremities with intermittent claudication, bilateral legs: Secondary | ICD-10-CM | POA: Diagnosis not present

## 2019-01-01 DIAGNOSIS — I739 Peripheral vascular disease, unspecified: Secondary | ICD-10-CM | POA: Diagnosis present

## 2019-01-01 HISTORY — PX: PERIPHERAL VASCULAR INTERVENTION: CATH118257

## 2019-01-01 HISTORY — PX: ABDOMINAL AORTOGRAM W/LOWER EXTREMITY: CATH118223

## 2019-01-01 LAB — GLUCOSE, CAPILLARY
Glucose-Capillary: 143 mg/dL — ABNORMAL HIGH (ref 70–99)
Glucose-Capillary: 46 mg/dL — ABNORMAL LOW (ref 70–99)
Glucose-Capillary: 116 mg/dL — ABNORMAL HIGH (ref 70–99)
Glucose-Capillary: 63 mg/dL — ABNORMAL LOW (ref 70–99)
Glucose-Capillary: 273 mg/dL — ABNORMAL HIGH (ref 70–99)
Glucose-Capillary: 290 mg/dL — ABNORMAL HIGH (ref 70–99)
Glucose-Capillary: 258 mg/dL — ABNORMAL HIGH (ref 70–99)
Glucose-Capillary: 150 mg/dL — ABNORMAL HIGH (ref 70–99)

## 2019-01-01 LAB — POCT ACTIVATED CLOTTING TIME
Activated Clotting Time: 268 seconds
Activated Clotting Time: 235 seconds
Activated Clotting Time: 175 seconds
Activated Clotting Time: 186 seconds

## 2019-01-01 SURGERY — ABDOMINAL AORTOGRAM W/LOWER EXTREMITY
Anesthesia: LOCAL | Laterality: Bilateral

## 2019-01-01 MED ORDER — METOPROLOL SUCCINATE ER 50 MG PO TB24
50.0000 mg | ORAL_TABLET | Freq: Every day | ORAL | Status: DC
  Administered 2019-01-02: 50 mg via ORAL
  Filled 2019-01-01: qty 1

## 2019-01-01 MED ORDER — SODIUM CHLORIDE 0.9 % IV SOLN: 250.0000 mL | INTRAVENOUS | Status: DC | PRN

## 2019-01-01 MED ORDER — DULOXETINE HCL 60 MG PO CPEP
60.0000 mg | ORAL_CAPSULE | ORAL | Status: DC
  Administered 2019-01-02: 60 mg via ORAL
  Filled 2019-01-01: qty 1

## 2019-01-01 MED ORDER — CLOPIDOGREL BISULFATE 75 MG PO TABS
75.0000 mg | ORAL_TABLET | Freq: Every day | ORAL | Status: DC
  Administered 2019-01-02: 75 mg via ORAL
  Filled 2019-01-01: qty 1

## 2019-01-01 MED ORDER — IODIXANOL 320 MG/ML IV SOLN
INTRAVENOUS | Status: DC | PRN
  Administered 2019-01-01: 160 mL via INTRA_ARTERIAL

## 2019-01-01 MED ORDER — FENTANYL CITRATE (PF) 100 MCG/2ML IJ SOLN
INTRAMUSCULAR | Status: DC | PRN
  Administered 2019-01-01: 25 ug via INTRAVENOUS

## 2019-01-01 MED ORDER — PROMETHAZINE HCL 25 MG PO TABS: 25.0000 mg | ORAL_TABLET | ORAL | Status: DC | PRN

## 2019-01-01 MED ORDER — SODIUM CHLORIDE 0.9% FLUSH
3.0000 mL | Freq: Two times a day (BID) | INTRAVENOUS | Status: DC
  Administered 2019-01-01 – 2019-01-02 (×2): 3 mL via INTRAVENOUS

## 2019-01-01 MED ORDER — AZELASTINE HCL 0.1 % NA SOLN
1.0000 | Freq: Every day | NASAL | Status: DC
  Filled 2019-01-01: qty 30

## 2019-01-01 MED ORDER — SODIUM CHLORIDE 0.9 % IV SOLN: INTRAVENOUS | Status: AC

## 2019-01-01 MED ORDER — LABETALOL HCL 5 MG/ML IV SOLN: 10.0000 mg | INTRAVENOUS | Status: DC | PRN

## 2019-01-01 MED ORDER — ALPRAZOLAM 0.5 MG PO TABS
0.5000 mg | ORAL_TABLET | Freq: Three times a day (TID) | ORAL | Status: DC | PRN
  Administered 2019-01-01: 0.5 mg via ORAL
  Filled 2019-01-01: qty 1

## 2019-01-01 MED ORDER — SODIUM CHLORIDE 0.9% FLUSH: 3.0000 mL | INTRAVENOUS | Status: DC | PRN

## 2019-01-01 MED ORDER — SODIUM CHLORIDE 0.9% FLUSH: 3.0000 mL | Freq: Two times a day (BID) | INTRAVENOUS | Status: DC

## 2019-01-01 MED ORDER — ASPIRIN EC 81 MG PO TBEC
81.0000 mg | DELAYED_RELEASE_TABLET | Freq: Every day | ORAL | Status: DC
  Administered 2019-01-02: 81 mg via ORAL
  Filled 2019-01-01: qty 1

## 2019-01-01 MED ORDER — CLOPIDOGREL BISULFATE 300 MG PO TABS
ORAL_TABLET | ORAL | Status: DC | PRN
  Administered 2019-01-01: 300 mg via ORAL

## 2019-01-01 MED ORDER — FAMOTIDINE 20 MG PO TABS
20.0000 mg | ORAL_TABLET | Freq: Every day | ORAL | Status: DC
  Administered 2019-01-01: 20 mg via ORAL
  Filled 2019-01-01: qty 1

## 2019-01-01 MED ORDER — HEPARIN SODIUM (PORCINE) 1000 UNIT/ML IJ SOLN
INTRAMUSCULAR | Status: AC
  Filled 2019-01-01: qty 1

## 2019-01-01 MED ORDER — MIDAZOLAM HCL 2 MG/2ML IJ SOLN
INTRAMUSCULAR | Status: DC | PRN
  Administered 2019-01-01: 1 mg via INTRAVENOUS

## 2019-01-01 MED ORDER — AMLODIPINE BESYLATE 5 MG PO TABS
5.0000 mg | ORAL_TABLET | Freq: Two times a day (BID) | ORAL | Status: DC
  Administered 2019-01-01 – 2019-01-02 (×2): 5 mg via ORAL
  Filled 2019-01-01 (×2): qty 1

## 2019-01-01 MED ORDER — ROSUVASTATIN CALCIUM 20 MG PO TABS
20.0000 mg | ORAL_TABLET | Freq: Every day | ORAL | Status: DC
  Administered 2019-01-01: 20 mg via ORAL
  Filled 2019-01-01: qty 1

## 2019-01-01 MED ORDER — LEVOCETIRIZINE DIHYDROCHLORIDE 5 MG PO TABS: 5.0000 mg | ORAL_TABLET | ORAL | Status: DC

## 2019-01-01 MED ORDER — LEVOTHYROXINE SODIUM 112 MCG PO TABS
112.0000 ug | ORAL_TABLET | Freq: Every day | ORAL | Status: DC
  Administered 2019-01-02: 112 ug via ORAL
  Filled 2019-01-01: qty 1

## 2019-01-01 MED ORDER — GABAPENTIN 300 MG PO CAPS
300.0000 mg | ORAL_CAPSULE | Freq: Two times a day (BID) | ORAL | Status: DC
  Filled 2019-01-01: qty 1

## 2019-01-01 MED ORDER — METHOCARBAMOL 750 MG PO TABS: 750.0000 mg | ORAL_TABLET | Freq: Three times a day (TID) | ORAL | Status: DC | PRN

## 2019-01-01 MED ORDER — BUSPIRONE HCL 5 MG PO TABS
15.0000 mg | ORAL_TABLET | Freq: Two times a day (BID) | ORAL | Status: DC
  Administered 2019-01-01 – 2019-01-02 (×2): 15 mg via ORAL
  Filled 2019-01-01 (×2): qty 3

## 2019-01-01 MED ORDER — VITAMIN D (ERGOCALCIFEROL) 1.25 MG (50000 UNIT) PO CAPS: 50000.0000 [IU] | ORAL_CAPSULE | ORAL | Status: DC

## 2019-01-01 MED ORDER — ONDANSETRON HCL 4 MG/2ML IJ SOLN: 4.0000 mg | Freq: Four times a day (QID) | INTRAMUSCULAR | Status: DC | PRN

## 2019-01-01 MED ORDER — INSULIN PUMP
Freq: Three times a day (TID) | SUBCUTANEOUS | Status: DC
  Administered 2019-01-01: 13.3 via SUBCUTANEOUS
  Administered 2019-01-01: 4.9 via SUBCUTANEOUS
  Administered 2019-01-01: 7.5 via SUBCUTANEOUS
  Administered 2019-01-02: 09:00:00 5.6 via SUBCUTANEOUS
  Administered 2019-01-02: 6.2 via SUBCUTANEOUS
  Filled 2019-01-01: qty 1

## 2019-01-01 MED ORDER — HEPARIN SODIUM (PORCINE) 1000 UNIT/ML IJ SOLN
INTRAMUSCULAR | Status: DC | PRN
  Administered 2019-01-01: 7000 [IU] via INTRAVENOUS

## 2019-01-01 MED ORDER — FENTANYL CITRATE (PF) 100 MCG/2ML IJ SOLN
INTRAMUSCULAR | Status: AC
  Filled 2019-01-01: qty 2

## 2019-01-01 MED ORDER — GABAPENTIN 300 MG PO CAPS: 300.0000 mg | ORAL_CAPSULE | ORAL | Status: DC

## 2019-01-01 MED ORDER — LOSARTAN POTASSIUM 50 MG PO TABS
100.0000 mg | ORAL_TABLET | Freq: Every day | ORAL | Status: DC
  Administered 2019-01-01: 100 mg via ORAL
  Filled 2019-01-01: qty 2

## 2019-01-01 MED ORDER — LATANOPROST 0.005 % OP SOLN
1.0000 [drp] | Freq: Every day | OPHTHALMIC | Status: DC
  Administered 2019-01-01: 1 [drp] via OPHTHALMIC
  Filled 2019-01-01: qty 2.5

## 2019-01-01 MED ORDER — LIDOCAINE HCL (PF) 1 % IJ SOLN
INTRAMUSCULAR | Status: DC | PRN
  Administered 2019-01-01 (×2): 30 mL via INTRADERMAL

## 2019-01-01 MED ORDER — SODIUM CHLORIDE 0.9 % WEIGHT BASED INFUSION
3.0000 mL/kg/h | INTRAVENOUS | Status: DC
  Administered 2019-01-01: 3 mL/kg/h via INTRAVENOUS

## 2019-01-01 MED ORDER — MORPHINE SULFATE (PF) 2 MG/ML IV SOLN: 2.0000 mg | INTRAVENOUS | Status: DC | PRN

## 2019-01-01 MED ORDER — PANTOPRAZOLE SODIUM 20 MG PO TBEC
20.0000 mg | DELAYED_RELEASE_TABLET | Freq: Every day | ORAL | Status: DC
  Administered 2019-01-02: 20 mg via ORAL
  Filled 2019-01-01: qty 1

## 2019-01-01 MED ORDER — HEPARIN (PORCINE) IN NACL 1000-0.9 UT/500ML-% IV SOLN
INTRAVENOUS | Status: DC | PRN
  Administered 2019-01-01 (×2): 500 mL

## 2019-01-01 MED ORDER — CYANOCOBALAMIN 1000 MCG/ML IJ SOLN: 1000.0000 ug | INTRAMUSCULAR | Status: DC

## 2019-01-01 MED ORDER — SODIUM CHLORIDE 0.9 % WEIGHT BASED INFUSION: 1.0000 mL/kg/h | INTRAVENOUS | Status: DC

## 2019-01-01 MED ORDER — CLOPIDOGREL BISULFATE 300 MG PO TABS
ORAL_TABLET | ORAL | Status: AC
  Filled 2019-01-01: qty 1

## 2019-01-01 MED ORDER — MIDAZOLAM HCL 2 MG/2ML IJ SOLN
INTRAMUSCULAR | Status: AC
  Filled 2019-01-01: qty 2

## 2019-01-01 MED ORDER — LIDOCAINE HCL (PF) 1 % IJ SOLN
INTRAMUSCULAR | Status: AC
  Filled 2019-01-01: qty 30

## 2019-01-01 MED ORDER — HEPARIN (PORCINE) IN NACL 1000-0.9 UT/500ML-% IV SOLN
INTRAVENOUS | Status: AC
  Filled 2019-01-01: qty 1000

## 2019-01-01 MED ORDER — DULOXETINE HCL 30 MG PO CPEP
30.0000 mg | ORAL_CAPSULE | ORAL | Status: DC
  Administered 2019-01-02: 30 mg via ORAL
  Filled 2019-01-01: qty 1

## 2019-01-01 MED ORDER — ACETAMINOPHEN 325 MG PO TABS: 650.0000 mg | ORAL_TABLET | ORAL | Status: DC | PRN

## 2019-01-01 MED ORDER — GABAPENTIN 300 MG PO CAPS
600.0000 mg | ORAL_CAPSULE | Freq: Every day | ORAL | Status: DC
  Administered 2019-01-01: 600 mg via ORAL
  Filled 2019-01-01: qty 2

## 2019-01-01 MED ORDER — ASPIRIN 81 MG PO CHEW: 81.0000 mg | CHEWABLE_TABLET | ORAL | Status: DC

## 2019-01-01 MED ORDER — ALBUTEROL SULFATE HFA 108 (90 BASE) MCG/ACT IN AERS: 2.0000 | INHALATION_SPRAY | RESPIRATORY_TRACT | Status: DC | PRN

## 2019-01-01 MED ORDER — HYDRALAZINE HCL 20 MG/ML IJ SOLN: 5.0000 mg | INTRAMUSCULAR | Status: DC | PRN

## 2019-01-01 MED ORDER — HYDROCODONE-ACETAMINOPHEN 10-325 MG PO TABS
1.0000 | ORAL_TABLET | Freq: Four times a day (QID) | ORAL | Status: DC | PRN
  Administered 2019-01-02: 1 via ORAL
  Filled 2019-01-01: qty 1

## 2019-01-01 MED ORDER — ALBUTEROL SULFATE (2.5 MG/3ML) 0.083% IN NEBU: 2.5000 mg | INHALATION_SOLUTION | RESPIRATORY_TRACT | Status: DC | PRN

## 2019-01-01 MED ORDER — MONTELUKAST SODIUM 10 MG PO TABS
10.0000 mg | ORAL_TABLET | Freq: Every day | ORAL | Status: DC
  Administered 2019-01-01: 10 mg via ORAL
  Filled 2019-01-01: qty 1

## 2019-01-01 SURGICAL SUPPLY — 21 items
BALLN MUSTANG 4.0X40 75 (BALLOONS) ×4
BALLOON MUSTANG 4.0X40 75 (BALLOONS) ×2 IMPLANT
CATH ANGIO 5F PIGTAIL 65CM (CATHETERS) ×2 IMPLANT
CATH SOFT-VU 4F 65 STRAIGHT (CATHETERS) ×1 IMPLANT
CATH SOFT-VU STRAIGHT 4F 65CM (CATHETERS) ×1
DEVICE CONTINUOUS FLUSH (MISCELLANEOUS) ×2 IMPLANT
KIT ENCORE 26 ADVANTAGE (KITS) ×4 IMPLANT
KIT PV (KITS) ×2 IMPLANT
SHEATH BRITE TIP 7FR 35CM (SHEATH) ×4 IMPLANT
SHEATH PINNACLE 5F 10CM (SHEATH) ×2 IMPLANT
SHEATH PINNACLE 7F 10CM (SHEATH) ×4 IMPLANT
STENT VIABAHN 7X29X80 VBX (Permanent Stent) ×4 IMPLANT
STOPCOCK MORSE 400PSI 3WAY (MISCELLANEOUS) ×2 IMPLANT
SYR MEDRAD MARK 7 150ML (SYRINGE) ×2 IMPLANT
TAPE VIPERTRACK RADIOPAQ (MISCELLANEOUS) ×1 IMPLANT
TAPE VIPERTRACK RADIOPAQUE (MISCELLANEOUS) ×1
TRANSDUCER W/STOPCOCK (MISCELLANEOUS) ×2 IMPLANT
TRAY PV CATH (CUSTOM PROCEDURE TRAY) ×2 IMPLANT
TUBING CIL FLEX 10 FLL-RA (TUBING) ×2 IMPLANT
WIRE HI TORQ VERSACORE J 260CM (WIRE) ×2 IMPLANT
WIRE HITORQ VERSACORE ST 145CM (WIRE) ×4 IMPLANT

## 2019-01-01 NOTE — Interval H&P Note (Signed)
History and Physical Interval Note:  01/01/2019 7:40 AM  Ashley [name]  has presented today for surgery, with the diagnosis of PAD.  The various methods of treatment have been discussed with the patient and family. After consideration of risks, benefits and other options for treatment, the patient has consented to  Procedure(s): ABDOMINAL AORTOGRAM W/LOWER EXTREMITY (Bilateral) as a surgical intervention.  The patient's history has been reviewed, patient examined, no change in status, stable for surgery.  I have reviewed the patient's chart and labs.  Questions were answered to the patient's satisfaction.     Quay Burow

## 2019-01-01 NOTE — Progress Notes (Signed)
Inpatient Diabetes Program Recommendations  AACE/ADA: New Consensus Statement on Inpatient Glycemic Control (2015)  Target Ranges:  Prepandial:   less than 140 mg/dL      Peak postprandial:   less than 180 mg/dL (1-2 hours)      Critically ill patients:  140 - 180 mg/dL   Lab Results  Component Value Date   GLUCAP 290 (H) 01/01/2019    Review of Glycemic Control  Diabetes history: DM1 Outpatient Diabetes medications: Insulin pump Current orders for Inpatient glycemic control: Insulin pump  Do not have insulin pump settings (MD office closed) Had hypo of 46, 63 mg/dL this am. Likely from being NPO. 4.9 units bolused at 13:28 for blood sugar of 273 mg/dL.   Inpatient Diabetes Program Recommendations:     Secure text to RN requesting insulin pump contract to be signed by pt, assessment completed every shift by RN, and record boluses when pt enters on insulin pump.  Will f/u on 7/24.  Thank you. Lorenda Peck, RD, LDN, CDE Inpatient Diabetes Coordinator [phone]

## 2019-01-01 NOTE — Progress Notes (Signed)
Site area: rt groin fa sheath Site Prior to Removal:  Level 0 Pressure Applied For: 25 minutes Manual:   yes Patient Status During Pull:  stable Post Pull Site:  Level 0 Post Pull Instructions Given:  yes Post Pull Pulses Present: rt pt dopplered Dressing Applied:  Gauze and tegaderm Bedrest begins @  Comments:

## 2019-01-01 NOTE — Progress Notes (Signed)
CBG 46. Patient asymptomatic. Drank 4oz OJ. Will recheck in 15-20 minutes.

## 2019-01-01 NOTE — Progress Notes (Signed)
BP 132/64

## 2019-01-01 NOTE — Progress Notes (Signed)
Site area: left groin fa sheath Site Prior to Removal:  Level 0 Pressure Applied For: 25 minutes Manual:   yes Patient Status During Pull:  stable Post Pull Site:  Level  0 Post Pull Instructions Given:  yes Post Pull Pulses Present: left pt dopplered Dressing Applied:  Gauze and tegaderm Bedrest begins @ [id number] Comments:

## 2019-01-02 DIAGNOSIS — I10 Essential (primary) hypertension: Secondary | ICD-10-CM | POA: Diagnosis not present

## 2019-01-02 DIAGNOSIS — Z881 Allergy status to other antibiotic agents status: Secondary | ICD-10-CM | POA: Diagnosis not present

## 2019-01-02 DIAGNOSIS — I739 Peripheral vascular disease, unspecified: Secondary | ICD-10-CM | POA: Diagnosis not present

## 2019-01-02 DIAGNOSIS — Z794 Long term (current) use of insulin: Secondary | ICD-10-CM | POA: Diagnosis not present

## 2019-01-02 DIAGNOSIS — Z8249 Family history of ischemic heart disease and other diseases of the circulatory system: Secondary | ICD-10-CM | POA: Diagnosis not present

## 2019-01-02 DIAGNOSIS — E1151 Type 2 diabetes mellitus with diabetic peripheral angiopathy without gangrene: Secondary | ICD-10-CM | POA: Diagnosis not present

## 2019-01-02 DIAGNOSIS — E785 Hyperlipidemia, unspecified: Secondary | ICD-10-CM | POA: Diagnosis not present

## 2019-01-02 DIAGNOSIS — Z79899 Other long term (current) drug therapy: Secondary | ICD-10-CM | POA: Diagnosis not present

## 2019-01-02 DIAGNOSIS — I70213 Atherosclerosis of native arteries of extremities with intermittent claudication, bilateral legs: Secondary | ICD-10-CM | POA: Diagnosis not present

## 2019-01-02 DIAGNOSIS — F17210 Nicotine dependence, cigarettes, uncomplicated: Secondary | ICD-10-CM | POA: Diagnosis not present

## 2019-01-02 LAB — GLUCOSE, CAPILLARY
Glucose-Capillary: 228 mg/dL — ABNORMAL HIGH (ref 70–99)
Glucose-Capillary: 168 mg/dL — ABNORMAL HIGH (ref 70–99)
Glucose-Capillary: 193 mg/dL — ABNORMAL HIGH (ref 70–99)

## 2019-01-02 MED ORDER — CLOPIDOGREL BISULFATE 75 MG PO TABS: 75.0000 mg | ORAL_TABLET | Freq: Every day | ORAL | 3 refills | Status: DC

## 2019-01-02 MED ORDER — ASPIRIN 81 MG PO TBEC: 81.0000 mg | DELAYED_RELEASE_TABLET | Freq: Every day | ORAL | 3 refills | Status: DC

## 2019-01-02 MED FILL — ASPIRIN LOW DOSE 81 MG TBEC: 81 | 90 days supply | Qty: 90 | Fill #0

## 2019-01-02 MED FILL — CLOPIDOGREL 75 MG TABLET: 75 | 90 days supply | Qty: 90 | Fill #0

## 2019-01-02 NOTE — Discharge Instructions (Signed)
PLEASE REMEMBER TO BRING ALL OF YOUR MEDICATIONS TO EACH OF YOUR FOLLOW-UP OFFICE VISITS.  PLEASE ATTEND ALL SCHEDULED FOLLOW-UP APPOINTMENTS.   Activity: Increase activity slowly as tolerated. You may shower, but no soaking baths (or swimming) for 1 week. No driving for 24 hours. No lifting over 5 lbs for 1 week. No sexual activity for 1 week.   You May Return to Work: in 1 week (if applicable)  Wound Care: You may wash cath site gently with soap and water. Keep cath site clean and dry. If you notice pain, swelling, bleeding or pus at your cath site, please call [phone].     DRINK PLENTY OF FLUIDS FOR THE NEXT 2-3 DAYS TO KEEP HYDRATED.  Femoral Site Care This sheet gives you information about how to care for yourself after your procedure. Your health care provider may also give you more specific instructions. If you have problems or questions, contact your health care provider. What can I expect after the procedure? After the procedure, it is common to have:  Bruising that usually fades within 1-2 weeks.  Tenderness at the site. Follow these instructions at home: Wound care  Follow instructions from your health care provider about how to take care of your insertion site. Make sure you: ? Wash your hands with soap and water before you change your bandage (dressing). If soap and water are not available, use hand sanitizer. ? Change your dressing as told by your health care provider. ? Leave stitches (sutures), skin glue, or adhesive strips in place. These skin closures may need to stay in place for 2 weeks or longer. If adhesive strip edges start to loosen and curl up, you may trim the loose edges. Do not remove adhesive strips completely unless your health care provider tells you to do that.  Do not take baths, swim, or use a hot tub until your health care provider approves.  You may shower 24-48 hours after the procedure or as told by your health care provider. ? Gently wash  the site with plain soap and water. ? Pat the area dry with a clean towel. ? Do not rub the site. This may cause bleeding.  Do not apply powder or lotion to the site. Keep the site clean and dry.  Check your femoral site every day for signs of infection. Check for: ? Redness, swelling, or pain. ? Fluid or blood. ? Warmth. ? Pus or a bad smell. Activity  For the first 2-3 days after your procedure, or as long as directed: ? Avoid climbing stairs as much as possible. ? Do not squat.  Do not lift anything that is heavier than 10 lb (4.5 kg), or the limit that you are told, until your health care provider says that it is safe.  Rest as directed. ? Avoid sitting for a long time without moving. Get up to take short walks every 1-2 hours.  Do not drive for 24 hours if you were given a medicine to help you relax (sedative). General instructions  Take over-the-counter and prescription medicines only as told by your health care provider.  Keep all follow-up visits as told by your health care provider. This is important. Contact a health care provider if you have:  A fever or chills.  You have redness, swelling, or pain around your insertion site. Get help right away if:  The catheter insertion area swells very fast.  You pass out.  You suddenly start to sweat or your skin gets clammy.  The catheter insertion area is bleeding, and the bleeding does not stop when you hold steady pressure on the area.  The area near or just beyond the catheter insertion site becomes pale, cool, tingly, or numb. These symptoms may represent a serious problem that is an emergency. Do not wait to see if the symptoms will go away. Get medical help right away. Call your local emergency services (911 in the [place] Do not drive yourself to the hospital. Summary  After the procedure, it is common to have bruising that usually fades within 1-2 weeks.  Check your femoral site every day for signs of  infection.  Do not lift anything that is heavier than 10 lb (4.5 kg), or the limit that you are told, until your health care provider says that it is safe. This information is not intended to replace advice given to you by your health care provider. Make sure you discuss any questions you have with your health care provider. Document Released: 01/29/2014 Document Revised: 06/10/2017 Document Reviewed: 06/10/2017 Elsevier Patient Education  2020 [employer].

## 2019-01-02 NOTE — Discharge Summary (Addendum)
Discharge Summary    Patient ID: Ashley [name],  MRN: [id number], DOB/AGE: 1966/01/01 53 y.o.  Admit date: 01/01/2019 Discharge date: 01/02/2019  Primary Care Provider: Prince Solian Primary Cardiologist: Quay Burow, MD  Discharge Diagnoses    Principal Problem:   Peripheral arterial disease ([place]) Active Problems:   OSA on CPAP   Essential hypertension   Hyperlipidemia   Claudication in peripheral vascular disease (HCC)   Allergies Allergies  Allergen Reactions  . Clindamycin Hives  . Other Hives    Tylox   . Sulfonamide Derivatives Hives  . Lipitor [Atorvastatin] Other (See Comments)    MUSCLE ACHES  . Restasis [Cyclosporine]     Pt stated, "My eyes turned red on the inside and outside of eye; burning sensation"  . Topamax [Topiramate] Itching    Diagnostic Studies/Procedures    PV angiogram/intervention 01/01/2019: Procedures Performed:               1.  Abdominal aortogram               2.  Bilateral iliac angiogram               3.  PTA and covered stenting using VBX stents origin of both iliac arteries using "kissing stent technique" _____________ Final Impression: Successful distal abdominal aorta, bilateral iliac artery ostial PTA and covered stenting using VBX stents with an excellent angiographic result.  The sheath will be removed once ACT falls below 170 pressure held.  Patient be hydrated overnight and discharged home in the morning.  We will get lower extremity arterial Doppler studies in our [facility] line office next week and I will see her back 2 to 3 weeks thereafter.   History of Present Illness     53 y.o. moderately overweight married Caucasian female mother of 1 son, grandmother to one grandchild referred by Dr. Paulla Dolly for peripheral vascular valuation because of poorly palpable pulses and leg pain.  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  She has a history of ongoing tobacco abuse of 4  cigarettes a day, treated hypertension, diabetes and hyperlipidemia.  Her father died of a myocardial infarction at age 1.  She is never had a heart attack or stroke and denies chest pain or shortness of breath.  She has been on insulin pump for last 26 years.  She has had bilateral lower extremity lifestyle and claudication for last 2 years with recent lower extremity Dopplers performed in our office 12/16/2018 revealing a right ABI 0.64 and a left 0.55.  She had high-grade bilateral iliac disease.  Hospital Course     Consultants: None   1. PAD: patient presented for PV angiography with possible intervention after outpatient study suggested high grade bilateral iliac stenosis. She underwent successful distal abdominal aorta, bilateral iliac artery ostial PTA and covered stenting using VBX stents with excellent angiographic results. She tolerated to procedure well without complications. She was started on aspirin and plavix for DAPT. Cath site is stable on the day of discharge.  - Continue aspirin and plavix - Continue statin - Follow-up ABIs and  Aorta/IVC/Iliacs [place] scheduled for 01/15/2019 - Follow-up with Dr. Gwenlyn Found scheduled for 01/21/2019  2. HTN: BP stable this admission - Continue amlodipine, losartan, and metoprolol succinate  3. HLD: LDL 131 on FLP 09/2018. - Continue crestor - Could consider referral to lipid clinic for PSK9-I consideration  4. Tobacco abuse: she continues to smoke  4 cigarettes per day.  - Continue to encourage smoking cessation  _____________  Discharge Vitals Blood pressure 129/63, pulse 98, temperature 98.2 F (36.8 C), temperature source Oral, resp. rate 18, height 4\' 11"  (1.499 m), weight 68.4 kg, SpO2 97 %.  Filed Weights   01/01/19 0609 01/02/19 0618  Weight: 68.9 kg 68.4 kg   Physical exam on the day of discharge:  GEN: laying in bed in no acute distress.   Neck: No JVD, no carotid bruits Cardiac: RRR, no murmurs, rubs, or gallops. Bilateral groin cath  sites C/D/I without hematoma, ecchymosis, or bruits.  Respiratory: Clear to auscultation bilaterally, no wheezes/ rales/ rhonchi GI: NABS, Soft, nontender, non-distended  MS: trace LE edema; No deformity. Neuro:  Nonfocal, moving all extremities spontaneously Psych: Normal affect      Labs & Radiologic Studies    CBC No results for input(s): WBC, NEUTROABS, HGB, HCT, MCV, PLT in the last 72 hours. Basic Metabolic Panel No results for input(s): NA, K, CL, CO2, GLUCOSE, BUN, CREATININE, CALCIUM, MG, PHOS in the last 72 hours. Liver Function Tests No results for input(s): AST, ALT, ALKPHOS, BILITOT, PROT, ALBUMIN in the last 72 hours. No results for input(s): LIPASE, AMYLASE in the last 72 hours. Cardiac Enzymes No results for input(s): CKTOTAL, CKMB, CKMBINDEX, TROPONINI in the last 72 hours. BNP Invalid input(s): POCBNP D-Dimer No results for input(s): DDIMER in the last 72 hours. Hemoglobin A1C No results for input(s): HGBA1C in the last 72 hours. Fasting Lipid Panel No results for input(s): CHOL, HDL, LDLCALC, TRIG, CHOLHDL, LDLDIRECT in the last 72 hours. Thyroid Function Tests No results for input(s): TSH, T4TOTAL, T3FREE, THYROIDAB in the last 72 hours.  Invalid input(s): FREET3 _____________  Vas [place] Le Art Seg Multi (segm&le Reynauds)  Result Date: 12/18/2018 LOWER EXTREMITY DOPPLER STUDY Indications: Claudication, and Patient referred from Dr. Paulla Dolly for decreased              pulses. Patient has painful plantar fasciitis of the right foot and              surgery was being planned but due to diminished pulses, arterial              studies were ordered. Upon talking with patient, she has leg pain              with walking as few as 200 yards, which has been present about a              year and a half. She thought the pain might be coming from her              back. It is relieved with rest but she wishes she could walk              further. High Risk Factors: Hypertension,  hyperlipidemia, Diabetes, current smoker.  Performing Technologist: Mariane Masters RVT  Examination Guidelines: A complete evaluation includes at minimum, Doppler waveform signals and systolic blood pressure reading at the level of bilateral brachial, anterior tibial, and posterior tibial arteries, when vessel segments are accessible. Bilateral testing is considered an integral part of a complete examination. Photoelectric Plethysmograph (PPG) waveforms and toe systolic pressure readings are included as required and additional duplex testing as needed. Limited examinations for reoccurring indications may be performed as noted.  ABI Findings: +---------+------------------+-----+----------+--------+ Right    Rt Pressure (mmHg)IndexWaveform  Comment  +---------+------------------+-----+----------+--------+ Brachial 152                                       +---------+------------------+-----+----------+--------+  CFA                             monophasic         +---------+------------------+-----+----------+--------+ Popliteal                       monophasic         +---------+------------------+-----+----------+--------+ ATA      78                0.51 monophasic         +---------+------------------+-----+----------+--------+ PTA      98                0.64 monophasic         +---------+------------------+-----+----------+--------+ PERO     85                0.56 monophasic         +---------+------------------+-----+----------+--------+ DP                              monophasic         +---------+------------------+-----+----------+--------+ Great Toe102               0.67                    +---------+------------------+-----+----------+--------+ +---------+------------------+-----+----------+-------+ Left     Lt Pressure (mmHg)IndexWaveform  Comment +---------+------------------+-----+----------+-------+ Brachial 140                                       +---------+------------------+-----+----------+-------+ CFA                             monophasic        +---------+------------------+-----+----------+-------+ Popliteal                       monophasic        +---------+------------------+-----+----------+-------+ ATA      82                0.54 monophasic        +---------+------------------+-----+----------+-------+ PTA      84                0.55 monophasic        +---------+------------------+-----+----------+-------+ PERO     62                0.41 monophasic        +---------+------------------+-----+----------+-------+ Great Toe69                0.45                   +---------+------------------+-----+----------+-------+ +-------+-----------+-----------+------------+------------+ ABI/TBIToday's ABIToday's TBIPrevious ABIPrevious TBI +-------+-----------+-----------+------------+------------+ Right  0.64       0.67                                +-------+-----------+-----------+------------+------------+ Left   0.55       0.45                                +-------+-----------+-----------+------------+------------+ TOES Findings: +----------+---------------+--------+-------+ Right ToesPressure (mmHg)WaveformComment +----------+---------------+--------+-------+  1st Digit                Abnormal        +----------+---------------+--------+-------+ 2nd Digit                Abnormal        +----------+---------------+--------+-------+ 3rd Digit                Abnormal        +----------+---------------+--------+-------+ 4th Digit                Abnormal        +----------+---------------+--------+-------+ 5th Digit                Abnormal        +----------+---------------+--------+-------+ +---------+---------------+--------+-------+ Left ToesPressure (mmHg)WaveformComment +---------+---------------+--------+-------+ 1st Digit               Abnormal         +---------+---------------+--------+-------+ 2nd Digit               Abnormal        +---------+---------------+--------+-------+ 3rd Digit               Abnormal        +---------+---------------+--------+-------+ 4th Digit               Abnormal        +---------+---------------+--------+-------+ 5th Digit               Abnormal        +---------+---------------+--------+-------+   Summary: Right: Resting right ankle-brachial index indicates moderate right lower extremity arterial disease. The right toe-brachial index is abnormal. Left: Resting left ankle-brachial index indicates moderate left lower extremity arterial disease. The left toe-brachial index is abnormal.  *See table(s) above for measurements and observations. See arterial duplex report.  Vascular consult recommended. Patient scheduled with Dr. Gwenlyn Found on 12/25/18. Electronically signed by Ida Rogue MD on 12/18/2018 at 10:40:18 PM.    Final    Vas [place] Lower Extremity Arterial Duplex  Result Date: 12/18/2018 LOWER EXTREMITY ARTERIAL DUPLEX STUDY Indications: Patient referred from Dr. Paulla Dolly for decreased pulses. Patient has              painful plantar fasciitis of the right foot and surgery was being              planned but due to diminished pulses, arterial studies were              ordered. Upon talking with patient, she has leg pain with walking              as few as 200 yards, which has been present about a year and a              half. She thought the pain might be coming from her back. It is              relieved with rest but she wishes she could walk further. High Risk Factors: Hypertension, hyperlipidemia, Diabetes, current smoker.  Current ABI: Today's ABIs are 0.64 on the right and 0.55 on the left. Limitations: Technically challenging and difficult imaging for abdominal portion              of exam due to patient body habitus, dense bowel gas, patient not              NPO (late afternoon study) Comparison Study: No  previous available for comparison Performing Technologist: Mariane Masters RVT  Examination Guidelines: A complete evaluation includes B-mode imaging, spectral Doppler, color Doppler, and power Doppler as needed of all accessible portions of each vessel. Bilateral testing is considered an integral part of a complete examination. Limited examinations for reoccurring indications may be performed as noted.  +----------+--------+-----+--------+----------+----------------------------+ RIGHT     PSV cm/sRatioStenosisWaveform  Comments                     +----------+--------+-----+--------+----------+----------------------------+ CIA Prox  335                  monophasic                             +----------+--------+-----+--------+----------+----------------------------+ CIA Mid   207                  monophasic                             +----------+--------+-----+--------+----------+----------------------------+ CIA Distal746          >50%    monophasicsevere/high-grade stenosis   +----------+--------+-----+--------+----------+----------------------------+ EIA Prox  338          >50%    monophasicturbulent flow post-stenosis +----------+--------+-----+--------+----------+----------------------------+ EIA Mid   122                  monophasic                             +----------+--------+-----+--------+----------+----------------------------+ EIA Distal122                  monophasic                             +----------+--------+-----+--------+----------+----------------------------+ CFA Prox  76                   monophasic                             +----------+--------+-----+--------+----------+----------------------------+ DFA       50                   monophasic                             +----------+--------+-----+--------+----------+----------------------------+ SFA Prox  63                   monophasic                              +----------+--------+-----+--------+----------+----------------------------+ SFA Mid   63                   monophasic                             +----------+--------+-----+--------+----------+----------------------------+ SFA Distal50                   monophasic                             +----------+--------+-----+--------+----------+----------------------------+ POP Prox  35  monophasic                             +----------+--------+-----+--------+----------+----------------------------+ POP Distal36                   monophasic                             +----------+--------+-----+--------+----------+----------------------------+ TP Trunk  45                   monophasic                             +----------+--------+-----+--------+----------+----------------------------+  +----------+--------+-----+-------------+----------+--------------------------+ LEFT      PSV cm/sRatioStenosis     Waveform  Comments                   +----------+--------+-----+-------------+----------+--------------------------+ CIA Prox  248                       monophasic                           +----------+--------+-----+-------------+----------+--------------------------+ CIA Mid                                       not visualized             +----------+--------+-----+-------------+----------+--------------------------+ CIA Distal760          >50% stenosis          severe/high-grade stenosis +----------+--------+-----+-------------+----------+--------------------------+ EIA Prox  77                        monophasicturbulent flow             +----------+--------+-----+-------------+----------+--------------------------+ EIA Mid   41                        monophasic                           +----------+--------+-----+-------------+----------+--------------------------+ EIA Distal87                        monophasic                            +----------+--------+-----+-------------+----------+--------------------------+ CFA Prox  79                        monophasic                           +----------+--------+-----+-------------+----------+--------------------------+ DFA       45                        monophasic                           +----------+--------+-----+-------------+----------+--------------------------+ SFA Prox  60                        monophasic                           +----------+--------+-----+-------------+----------+--------------------------+  SFA Mid   52                        monophasic                           +----------+--------+-----+-------------+----------+--------------------------+ SFA Distal31                        monophasic                           +----------+--------+-----+-------------+----------+--------------------------+ POP Prox  24                        monophasic                           +----------+--------+-----+-------------+----------+--------------------------+ POP Distal31                        monophasic                           +----------+--------+-----+-------------+----------+--------------------------+ TP Trunk  40                        monophasic                           +----------+--------+-----+-------------+----------+--------------------------+  Aorta: +--------+-------+----------+----------+----------+--------+-----+         AP (cm)Trans (cm)PSV (cm/s)Waveform  ThrombusShape +--------+-------+----------+----------+----------+--------+-----+ Proximal       2.00      72        monophasic              +--------+-------+----------+----------+----------+--------+-----+ Mid     2.10   2.10                                        +--------+-------+----------+----------+----------+--------+-----+ Distal                   65        monophasic               +--------+-------+----------+----------+----------+--------+-----+  Summary: Technically challenging abdominal examination. Unable to accurately visualize all aorta-iliac segments due to reasons mentioned above. Elevated velocities appear to be in the bilateral common iliac arteries but cannot rule out additional stenosis/occlusions of the aorta and/or external iliac arteries.  Right: Severe, high-grade >50% stenosis of the right common iliac artery with turbulent flow in the external iliac artery and severely dampened, monophasic waveforms throughout the rest of the right lower extremity. Left: Severe, high-grade >50% stenosis of the left common iliac artery with severely dampened, monophasic waveforms in the external iliac artery and throughout the rest of the left lower extremity.  See table(s) above for measurements and observations. See ABI report. Vascular consult recommended. Patient taken to check-out and scheduled with Dr. Gwenlyn Found on 12/25/18. Electronically signed by Ida Rogue MD on 12/18/2018 at 8:48:13 PM.    Final    Disposition   Patient was seen and examined by Dr. Harrington Challenger who deemed patient as stable for discharge. Follow-up has been arranged. Discharge medications as listed below.   Follow-up Plans & Appointments  Follow-up Information    Lorretta Harp, MD Follow up on 01/21/2019.   Specialties: Cardiology, Radiology Why: Please arrive 15 minutes early for your post-procedure follow-up appointment at 11:30am on 01/21/2019. Contact information: [address] [place] [address] [phone]        [facility] Follow up on 01/15/2019.   Specialty: Cardiology Why: Please arrive 15 minutes early for your post-procedure imaging appointment at 10:00am on 01/15/2019 Contact information: [address] [place] [phone]           Discharge Medications   Allergies as of 01/02/2019      Reactions    Clindamycin Hives   Other Hives   Tylox    Sulfonamide Derivatives Hives   Lipitor [atorvastatin] Other (See Comments)   MUSCLE ACHES   Restasis [cyclosporine]    Pt stated, "My eyes turned red on the inside and outside of eye; burning sensation"   Topamax [topiramate] Itching      Medication List    TAKE these medications   Accu-Chek Compact Plus test strip Generic drug: glucose blood 1 each by Other route as needed.   Accu-Chek Softclix Lancets lancets   ALPRAZolam 0.5 MG tablet Commonly known as: XANAX Take 0.5 mg by mouth 3 (three) times daily as needed for anxiety.   AMBULATORY NON FORMULARY MEDICATION NOVOLOG INSULIN PUMP USES AS DIRECTED   amLODipine 5 MG tablet Commonly known as: NORVASC Take 5 mg by mouth 2 (two) times a day.   aspirin 81 MG EC tablet Take 1 tablet (81 mg total) by mouth daily. Start taking on: January 03, 2019   Azelastine HCl 0.15 % Soln Place 1 spray into the nose daily.   busPIRone 15 MG tablet Commonly known as: BUSPAR Take 15 mg by mouth 2 (two) times daily.   clopidogrel 75 MG tablet Commonly known as: PLAVIX Take 1 tablet (75 mg total) by mouth daily with breakfast. Start taking on: January 03, 2019   cyanocobalamin 1000 MCG/ML injection Commonly known as: (VITAMIN B-12) Inject 1,000 mcg into the muscle every 30 (thirty) days.   DULoxetine 60 MG capsule Commonly known as: CYMBALTA Take 60 mg by mouth every morning. Takes 90 mg total   DULoxetine 30 MG capsule Commonly known as: CYMBALTA Take 30 mg by mouth every morning. Takes 90 mg total   famotidine 20 MG tablet Commonly known as: PEPCID Take 20 mg by mouth at bedtime.   fluticasone 50 MCG/ACT nasal spray Commonly known as: FLONASE Place 1 spray into the nose daily.   gabapentin 300 MG capsule Commonly known as: NEURONTIN Take 300-600 mg by mouth See admin instructions. 300 mg twice daily, and 600 mg at bedtime   HYDROcodone-acetaminophen 10-325 MG tablet Commonly  known as: NORCO Take 1 tablet by mouth every 6 (six) hours as needed for moderate pain.   lansoprazole 30 MG capsule Commonly known as: PREVACID Take 30 mg by mouth daily.   latanoprost 0.005 % ophthalmic solution Commonly known as: XALATAN Place 1 drop into both eyes at bedtime.   levothyroxine 112 MCG tablet Commonly known as: SYNTHROID Take 112 mcg by mouth daily before breakfast.   losartan 100 MG tablet Commonly known as: COZAAR Take 100 mg by mouth at bedtime.   methocarbamol 750 MG tablet Commonly known as: ROBAXIN Take 750 mg by mouth every 8 (eight) hours as needed for muscle spasms.   metoprolol succinate 50 MG 24 hr tablet Commonly known as: TOPROL-XL  Take 50 mg by mouth daily. One tablet by mouth once daily   montelukast 10 MG tablet Commonly known as: SINGULAIR Take 10 mg by mouth at bedtime.   NovoLOG 100 UNIT/ML injection Generic drug: insulin aspart Inject into the skin See admin instructions. Pt has Insulin pump   pregabalin 75 MG capsule Commonly known as: LYRICA Take 75 mg by mouth.   albuterol (2.5 MG/3ML) 0.083% nebulizer solution Commonly known as: PROVENTIL Take 2.5 mg by nebulization every 4 (four) hours as needed for wheezing or shortness of breath.   ProAir HFA 108 (90 Base) MCG/ACT inhaler Generic drug: albuterol Inhale 2 puffs into the lungs every 4 (four) hours as needed for wheezing or shortness of breath.   promethazine 25 MG tablet Commonly known as: PHENERGAN Take 25 mg by mouth every 4 (four) hours as needed for nausea or vomiting. Every 4-6 hours as needed   rosuvastatin 20 MG tablet Commonly known as: CRESTOR Take 20 mg by mouth at bedtime.   Vitamin D (Ergocalciferol) 1.25 MG (50000 UT) Caps capsule Commonly known as: DRISDOL Take 50,000 Units by mouth every 7 (seven) days.   Xyzal 5 MG tablet Generic drug: levocetirizine Take 5 mg by mouth every morning.         Outstanding Labs/Studies   Post-PV procedure ABIs  and Ultrasound scheduled for 01/15/2019.   Duration of Discharge Encounter   Greater than 30 minutes including physician time.  Signed, Abigail Butts PA-C 01/02/2019, 9:54 AM   Patient seen and examined  I agree with findings as noted by K Kroeger above  Pt s/p PTA to Lower extremities ON exaM Lungs CTA    Cardiac RRR   No S3 Abd is supple   Ext   Bilateral groins without swelling or bruits   2+ R DP  1+LDP pulses  Triv edema  Plan as noted above   F/U USN in 2 wks Counselled on smoking cessation  Dorris Carnes MD

## 2019-01-02 NOTE — Plan of Care (Signed)
  Problem: Clinical Measurements: Goal: Ability to maintain clinical measurements within normal limits will improve Outcome: Progressing   Problem: Clinical Measurements: Goal: Cardiovascular complication will be avoided Outcome: Adequate for Discharge   Problem: Nutrition: Goal: Adequate nutrition will be maintained Outcome: Adequate for Discharge

## 2019-01-05 ENCOUNTER — Telehealth: Payer: Self-pay | Admitting: Cardiovascular Disease

## 2019-01-05 NOTE — Telephone Encounter (Signed)
New message    Pt c/o swelling: STAT is pt has developed SOB within 24 hours  1) How much weight have you gained and in what time span? No   2) If swelling, where is the swelling located? Right leg above foot   3) Are you currently taking a fluid pill?no   4) Are you currently SOB? No, patient is running a fever 100.1   5) Do you have a log of your daily weights (if so, list)? No   6) Have you gained 3 pounds in a day or 5 pounds in a week? No   7) Have you traveled recently? No

## 2019-01-05 NOTE — Telephone Encounter (Signed)
Spoke to patient she stated she had stents put in legs last Thursday.Stated she woke up yesterday with a fever 100.8.Stated she had chills.Stated she has bandaids on both groins,no drainage on band aids and no redness. Stated she has a area above right ankle appox the size of a half dollar that is red and hot to touch.Stated she continues to have a fever 100.1 and area above right ankle red and hot to touch.Advised to go to ED.Trish notified.

## 2019-01-08 ENCOUNTER — Telehealth: Payer: Self-pay | Admitting: Cardiovascular Disease

## 2019-01-08 DIAGNOSIS — G4733 Obstructive sleep apnea (adult) (pediatric): Secondary | ICD-10-CM | POA: Diagnosis not present

## 2019-01-08 NOTE — Telephone Encounter (Signed)
New Message       Pt c/o swelling: STAT is pt has developed SOB within 24 hours  1) How much weight have you gained and in what time span? No, just started after she had surgery a week ago   2) If swelling, where is the swelling located? Both feet   3) Are you currently taking a fluid pill? No   4) Are you currently SOB? No   Do you have a log of your daily weights (if so, list)?  No, pt said she lossed 2.5 pounds while in the hospital   5) Have you gained 3 pounds in a day or 5 pounds in a week? No   6) Have you traveled recently? No

## 2019-01-08 NOTE — Telephone Encounter (Signed)
Spoke to patient she stated since she had a pv stent recently she has had increased swelling in both feet.Stated she cannot get tennis shoes on.No sob.She does not eat salt.Appointment scheduled with Fabian Sharp PA 01/09/19 at 11:00 am.

## 2019-01-08 NOTE — Progress Notes (Addendum)
Cardiology Office Note:    Date:  01/09/2019   ID:  Ashley [name], DOB 03/16/1966, MRN [id number]  PCP:  Prince Solian, MD  Cardiologist:  Quay Burow, MD   Referring MD: Prince Solian, MD   Chief Complaint  Patient presents with  . Follow-up    lower extremity swelling    History of Present Illness:    Ashley [name] is a 53 y.o. female with a hx of PAD, HTN, OSA on CPAP, COPD, HLD, DM, current smoker, GERD, and gastroparesis. She was referred to Dr. Gwenlyn Found for evaluation of PAD. Right ABI 0.64 and left ABI 0.55, she had high-grade bilateral iliac disease and subsequently underwent successful distal abdominal aorta, bilateral iliac artery ostial PTA and covered stenting using VBX stents with excellent angiographic results. She tolerated to procedure well without complications.  She was discharged on 01/02/2019 on aspirin and Plavix for DAPT.  Cath site was stable on the day of discharge.  Hypertension is managed with amlodipine, losartan, and metoprolol.  Hyperlipidemia is managed with Crestor, although may be considered for PSK 9 inhibitor.  She returns today for post hospital follow-up.  She did call to report that she had some lower extremity swelling, redness, and warmth that started Friday evening on the day of discharge. She also had a fever of 100.5 on Monday 01/05/19. She had no other symptoms. She was advised to go to the hospital for a COVID test, but she did not. The redness in her LE went away on Wed 01/07/19. She has not had a fever since. She states the swelling is down in the morning, but progressively worsens throughout the day. She does tell me that she tried to take 20 mg and then 40 mg lasix from her mother-in-law without resolution of swelling.  She is compliant on ASA and plavix. No bleeding problems. No SOB, orthopnea, CP, palpitations or syncope.     Past Medical History:  Diagnosis Date  . Anxiety   . Depression   . Diabetes mellitus ([place])   .  Esophageal stricture   . Gastroparesis   . GERD (gastroesophageal reflux disease)   . Graves disease 02/2010   diagnosed by radioactive iodine uptake and scan   . Helicobacter pylori infection   . Hyperlipidemia   . Hypertension   . Insulin pump in place   . Nephrolithiasis 2010   rt sided per GI  . Neuropathy    diabetic neuropathy  . Osteoporosis   . Vitamin D deficiency     Past Surgical History:  Procedure Laterality Date  . ABDOMINAL AORTOGRAM W/LOWER EXTREMITY Bilateral 01/01/2019   Procedure: ABDOMINAL AORTOGRAM W/LOWER EXTREMITY;  Surgeon: Lorretta Harp, MD;  Location: [facility];  Service: Cardiovascular;  Laterality: Bilateral;  . BREAST EXCISIONAL BIOPSY    . BREAST LUMPECTOMY     LEFT-BENIGN  . NASAL SEPTOPLASTY W/ TURBINOPLASTY Bilateral 09/07/2013   Procedure: SEPTOPLASTY, BILATERAL TURBINATE RESECTION ;  Surgeon: Ascencion Dike, MD;  Location: [facility];  Service: ENT;  Laterality: Bilateral;  . PERIPHERAL VASCULAR INTERVENTION Bilateral 01/01/2019   Procedure: PERIPHERAL VASCULAR INTERVENTION;  Surgeon: Lorretta Harp, MD;  Location: [facility];  Service: Cardiovascular;  Laterality: Bilateral;  . UPPER GASTROINTESTINAL ENDOSCOPY    . VAGINAL HYSTERECTOMY  2006    Current Medications: Current Meds  Medication Sig  . ACCU-CHEK COMPACT PLUS test strip 1 each by Other route as needed.   Marland Kitchen ACCU-CHEK SOFTCLIX LANCETS lancets   .  albuterol (PROVENTIL) (2.5 MG/3ML) 0.083% nebulizer solution Take 2.5 mg by nebulization every 4 (four) hours as needed for wheezing or shortness of breath.   . ALPRAZolam (XANAX) 0.5 MG tablet Take 0.5 mg by mouth 3 (three) times daily as needed for anxiety.   . AMBULATORY NON FORMULARY MEDICATION NOVOLOG INSULIN PUMP USES AS DIRECTED  . amLODipine (NORVASC) 5 MG tablet Take 5 mg by mouth 2 (two) times a day.   Marland Kitchen aspirin EC 81 MG EC tablet Take 1 tablet (81 mg total) by mouth daily.  . Azelastine HCl 0.15  % SOLN Place 1 spray into the nose daily.  . busPIRone (BUSPAR) 15 MG tablet Take 15 mg by mouth 2 (two) times daily.  . clopidogrel (PLAVIX) 75 MG tablet Take 1 tablet (75 mg total) by mouth daily with breakfast.  . cyanocobalamin (,VITAMIN B-12,) 1000 MCG/ML injection Inject 1,000 mcg into the muscle every 30 (thirty) days.   . DULoxetine (CYMBALTA) 30 MG capsule Take 30 mg by mouth every morning. Takes 90 mg total  . DULoxetine (CYMBALTA) 60 MG capsule Take 60 mg by mouth every morning. Takes 90 mg total  . famotidine (PEPCID) 20 MG tablet Take 20 mg by mouth at bedtime.  . fluticasone (FLONASE) 50 MCG/ACT nasal spray Place 1 spray into the nose daily.   Marland Kitchen gabapentin (NEURONTIN) 300 MG capsule Take 300-600 mg by mouth See admin instructions. 300 mg twice daily, and 600 mg at bedtime  . HYDROcodone-acetaminophen (NORCO) 10-325 MG tablet Take 1 tablet by mouth every 6 (six) hours as needed for moderate pain.  Marland Kitchen lansoprazole (PREVACID) 30 MG capsule Take 30 mg by mouth daily.  Marland Kitchen latanoprost (XALATAN) 0.005 % ophthalmic solution Place 1 drop into both eyes at bedtime.  Marland Kitchen levocetirizine (XYZAL) 5 MG tablet Take 5 mg by mouth every morning.   Marland Kitchen levothyroxine (SYNTHROID, LEVOTHROID) 112 MCG tablet Take 112 mcg by mouth daily before breakfast.   . losartan (COZAAR) 100 MG tablet Take 100 mg by mouth at bedtime.   . methocarbamol (ROBAXIN) 750 MG tablet Take 750 mg by mouth every 8 (eight) hours as needed for muscle spasms.   . metoprolol succinate (TOPROL-XL) 50 MG 24 hr tablet Take 50 mg by mouth daily. One tablet by mouth once daily   . montelukast (SINGULAIR) 10 MG tablet Take 10 mg by mouth at bedtime.  Marland Kitchen NOVOLOG 100 UNIT/ML injection Inject into the skin See admin instructions. Pt has Insulin pump  . pregabalin (LYRICA) 75 MG capsule Take 75 mg by mouth.   Marland Kitchen PROAIR HFA 108 (90 BASE) MCG/ACT inhaler Inhale 2 puffs into the lungs every 4 (four) hours as needed for wheezing or shortness of breath.    . promethazine (PHENERGAN) 25 MG tablet Take 25 mg by mouth every 4 (four) hours as needed for nausea or vomiting. Every 4-6 hours as needed  . rosuvastatin (CRESTOR) 20 MG tablet Take 20 mg by mouth at bedtime.  . Vitamin D, Ergocalciferol, (DRISDOL) 50000 UNITS CAPS Take 50,000 Units by mouth every 7 (seven) days.     Allergies:   Clindamycin, Other, Sulfonamide derivatives, Lipitor [atorvastatin], Restasis [cyclosporine], and Topamax [topiramate]   Social History   Socioeconomic History  . Marital status: Married    Spouse name: Not on file  . Number of children: Not on file  . Years of education: Not on file  . Highest education level: Not on file  Occupational History  . Occupation: Occupational psychologist  Social Needs  .  Financial resource strain: Not on file  . Food insecurity    Worry: Not on file    Inability: Not on file  . Transportation needs    Medical: Not on file    Non-medical: Not on file  Tobacco Use  . Smoking status: Current Some Day Smoker    Packs/day: 0.50    Years: 25.00    Pack years: 12.50    Types: Cigarettes  . Smokeless tobacco: Never Used  . Tobacco comment: 6-8 CIGS A DAY  Substance and Sexual Activity  . Alcohol use: No    Alcohol/week: 0.0 standard drinks  . Drug use: No  . Sexual activity: Not on file  Lifestyle  . Physical activity    Days per week: Not on file    Minutes per session: Not on file  . Stress: Not on file  Relationships  . Social Herbalist on phone: Not on file    Gets together: Not on file    Attends religious service: Not on file    Active member of club or organization: Not on file    Attends meetings of clubs or organizations: Not on file    Relationship status: Not on file  Other Topics Concern  . Not on file  Social History Narrative   Lives home with husband.  Education 12th grade.  Caffeine 4-5 drinks daily.     Family History: The patient's family history includes Alcoholism in her father; Cancer in  her mother; Diabetes in her father; Heart attack in her father. There is no history of Colon cancer, Esophageal cancer, Rectal cancer, or Stomach cancer.  ROS:   Please see the history of present illness.     All other systems reviewed and are negative.  EKGs/Labs/Other Studies Reviewed:    The following studies were reviewed today:  PV angiogram/intervention 01/01/2019: Procedures Performed: 1. Abdominal aortogram 2. Bilateral iliac angiogram 3. PTA and covered stenting using VBX stents origin of both iliac arteries using "kissing stent technique" _____________ Final Impression:Successful distal abdominal aorta, bilateral iliac artery ostial PTA and covered stenting using VBX stents with an excellent angiographic result. The sheath will be removed once ACT falls below 170 pressure held. Patient be hydrated overnight and discharged home in the morning. We will get lower extremity arterial Doppler studies in our [facility] line office next week and I will see her back 2 to 3 weeks thereafter.     EKG:  EKG is ordered today.  Sinus rhythm with HR 85  Recent Labs: 12/25/2018: BUN 6; Creatinine, Ser 0.71; Hemoglobin 12.4; Platelets 388; Potassium 3.9; Sodium 145; TSH 6.800  Recent Lipid Panel No results found for: CHOL, TRIG, HDL, CHOLHDL, VLDL, LDLCALC, LDLDIRECT  Physical Exam:    VS:  BP 124/64   Pulse 89   Temp 97.9 F (36.6 C)   Ht 4' 11.5" (1.511 m)   Wt 153 lb 6.4 oz (69.6 kg)   SpO2 98%   BMI 30.46 kg/m     Wt Readings from Last 3 Encounters:  01/09/19 153 lb 6.4 oz (69.6 kg)  01/02/19 150 lb 12.8 oz (68.4 kg)  12/25/18 152 lb (68.9 kg)     GEN:  Well nourished, well developed in no acute distress HEENT: Normal NECK: + mild JVD LYMPHATICS: No lymphadenopathy CARDIAC: RRR, no murmurs, rubs, gallops RESPIRATORY:  Clear to auscultation without rales, wheezing or rhonchi  ABDOMEN: Soft, non-tender, non-distended  MUSCULOSKELETAL:  Trace to 1+ LE edema; No deformity +  distal pulses bilaterally, good color, bilateral groin sites C/D/I without hematoma SKIN: Warm and dry NEUROLOGIC:  Alert and oriented x 3 PSYCHIATRIC:  Normal affect   ASSESSMENT:    1. Swelling of lower extremity   2. Mixed hyperlipidemia   3. Peripheral arterial disease ([place])   4. Essential hypertension    PLAN:    In order of problems listed above:  PAD Post PTCA and stent bilateral iliac arteries Lower extremity swelling and redness, but no pain Continue aspirin and Plavix Advised her to weigh daily. She thinks her dry weight is near 150 lbs. She has gained three lbs since discharge. Swelling is better in the morning and worsens throughout the day. The redness has resolved. I have a lower suspicion for cellulitis since her swelling and redness was bilateral. I also have a lower suspicion for DVT, but she did just have a hospitalization, although brief. Will draw basic labs to include CBC, CMP, and BNP. Will start 40 mg lasix and 20 mEq K daily for 5 days to see if this improves her swelling. I will also order a echo to evaluate structure and function and LE [place] to rule out DVT. She will start weighing daily - I will call her dry weight 150 lbs. She is 153 lbs today, which is a new weight gain for her since discharge.   Hyperlipidemia Will obtain lipid panel - not fasting On crestor 20 mg for 2 months. Last lipid profile on 09/17/18: Total chol: 202 HDL 44 LDL 131 Triglycerides 133   Peripheral neuropathy Continues with pain and tingling in feet, but consistent with neuropathy.   Hypertension Pressure has been well-controlled on losartan, toprol, and amlodipine.    Follow up as scheduled with Dr. Gwenlyn Found.   Medication Adjustments/Labs and Tests Ordered: Current medicines are reviewed at length with the patient today.  Concerns regarding medicines are outlined above.  Orders Placed This Encounter  Procedures  .  Brain natriuretic peptide  . Comprehensive metabolic panel  . CBC  . Lipid panel  . EKG 12-Lead  . ECHOCARDIOGRAM COMPLETE   Meds ordered this encounter  Medications  . furosemide (LASIX) 40 MG tablet    Sig: Take 1 tablet (40 mg total) by mouth daily. For 5 days    Dispense:  30 tablet    Refill:  0  . potassium chloride SA (K-DUR) 20 MEQ tablet    Sig: Take 1 tablet (20 mEq total) by mouth daily. For 5 days    Dispense:  30 tablet    Refill:  0    Signed, Ledora Bottcher, [place]  01/09/2019 11:52 AM    [facility]

## 2019-01-09 ENCOUNTER — Other Ambulatory Visit: Payer: Self-pay

## 2019-01-09 ENCOUNTER — Ambulatory Visit (INDEPENDENT_AMBULATORY_CARE_PROVIDER_SITE_OTHER): Payer: Medicare HMO | Admitting: Physician Assistant

## 2019-01-09 ENCOUNTER — Encounter: Payer: Self-pay | Admitting: Physician Assistant

## 2019-01-09 VITALS — BP 124/64 | HR 89 | Temp 97.9°F | Ht 59.5 in | Wt 153.4 lb

## 2019-01-09 DIAGNOSIS — M7989 Other specified soft tissue disorders: Secondary | ICD-10-CM | POA: Diagnosis not present

## 2019-01-09 DIAGNOSIS — I739 Peripheral vascular disease, unspecified: Secondary | ICD-10-CM

## 2019-01-09 DIAGNOSIS — E782 Mixed hyperlipidemia: Secondary | ICD-10-CM | POA: Diagnosis not present

## 2019-01-09 DIAGNOSIS — I10 Essential (primary) hypertension: Secondary | ICD-10-CM

## 2019-01-09 DIAGNOSIS — R0600 Dyspnea, unspecified: Secondary | ICD-10-CM | POA: Diagnosis not present

## 2019-01-09 MED ORDER — POTASSIUM CHLORIDE CRYS ER 20 MEQ PO TBCR: 20.0000 meq | EXTENDED_RELEASE_TABLET | Freq: Every day | ORAL | 0 refills | Status: DC

## 2019-01-09 MED ORDER — FUROSEMIDE 40 MG PO TABS: 40.0000 mg | ORAL_TABLET | Freq: Every day | ORAL | 0 refills | Status: DC

## 2019-01-09 NOTE — Patient Instructions (Addendum)
Medication Instructions:  Take Furosemide 40 mg daily for 5 days.  Take Potassium 20 mEq daily for 5 days.  If you need a refill on your cardiac medications before your next appointment, please call your pharmacy.   Lab work: Your physician recommends that you return for lab work today: CBC, CMET, BNP, Lipid Panel  If you have labs (blood work) drawn today and your tests are completely normal, you will receive your results only by: Marland Kitchen MyChart Message (if you have MyChart) OR . A paper copy in the mail If you have any lab test that is abnormal or we need to change your treatment, we will call you to review the results.  Testing/Procedures: Your physician has requested that you have a lower extremity venous duplex. This test is an ultrasound of the veins in the legs or arms. It looks at venous blood flow that carries blood from the heart to the legs or arms. Allow one hour for a Lower Venous exam. Allow thirty minutes for an Upper Venous exam. There are no restrictions or special instructions.  Your physician has requested that you have an echocardiogram. Echocardiography is a painless test that uses sound waves to create images of your heart. It provides your doctor with information about the size and shape of your heart and how well your heart's chambers and valves are working. This procedure takes approximately one hour. There are no restrictions for this procedure.   Follow-Up: At [facility], you and your health needs are our priority.  As part of our continuing mission to provide you with exceptional heart care, we have created designated Provider Care Teams.  These Care Teams include your primary Cardiologist (physician) and Advanced Practice Providers (APPs -  Physician Assistants and Nurse Practitioners) who all work together to provide you with the care you need, when you need it. . Please keep your follow-up appointment with Dr. Gwenlyn Found on 01/21/19.   Any Other Special Instructions  Will Be Listed Below (If Applicable). Your physician recommends that you weigh, daily, at the same time every day, and in the same amount of clothing. Please record your daily weights on the handout provided and bring it to your next appointment.   Your physician has requested that you regularly monitor and record your blood pressure readings at home. Please use the same machine at the same time of day to check your readings and record them to bring to your follow-up visit.

## 2019-01-10 LAB — LIPID PANEL
Chol/HDL Ratio: 2.8 ratio (ref 0.0–4.4)
Cholesterol, Total: 112 mg/dL (ref 100–199)
HDL: 40 mg/dL (ref >39)
LDL Calculated: 50 mg/dL (ref 0–99)
Triglycerides: 110 mg/dL (ref 0–149)
VLDL Cholesterol Cal: 22 mg/dL (ref 5–40)

## 2019-01-10 LAB — CBC
Hematocrit: 36.2 % (ref 34.0–46.6)
Hemoglobin: 11.6 g/dL (ref 11.1–15.9)
MCH: 26.2 pg — ABNORMAL LOW (ref 26.6–33.0)
MCHC: 32 g/dL (ref 31.5–35.7)
MCV: 82 fL (ref 79–97)
Platelets: 388 10*3/uL (ref 150–450)
RBC: 4.42 x10E6/uL (ref 3.77–5.28)
RDW: 13.5 % (ref 11.7–15.4)
WBC: 8.9 10*3/uL (ref 3.4–10.8)

## 2019-01-10 LAB — COMPREHENSIVE METABOLIC PANEL
ALT: 11 IU/L (ref 0–32)
AST: 13 IU/L (ref 0–40)
Albumin/Globulin Ratio: 2.1 (ref 1.2–2.2)
Albumin: 4.5 g/dL (ref 3.8–4.9)
Alkaline Phosphatase: 121 IU/L — ABNORMAL HIGH (ref 39–117)
BUN/Creatinine Ratio: 11 (ref 9–23)
BUN: 9 mg/dL (ref 6–24)
Bilirubin Total: <0.2 mg/dL (ref 0.0–1.2)
CO2: 22 mmol/L (ref 20–29)
Calcium: 9.7 mg/dL (ref 8.7–10.2)
Chloride: 103 mmol/L (ref 96–106)
Creatinine, Ser: 0.82 mg/dL (ref 0.57–1.00)
GFR calc Af Amer: 94 mL/min/{1.73_m2} (ref >59)
GFR calc non Af Amer: 82 mL/min/{1.73_m2} (ref >59)
Globulin, Total: 2.1 g/dL (ref 1.5–4.5)
Glucose: 108 mg/dL — ABNORMAL HIGH (ref 65–99)
Potassium: 4 mmol/L (ref 3.5–5.2)
Sodium: 145 mmol/L — ABNORMAL HIGH (ref 134–144)
Total Protein: 6.6 g/dL (ref 6.0–8.5)

## 2019-01-10 LAB — BRAIN NATRIURETIC PEPTIDE: BNP: 10.6 pg/mL (ref 0.0–100.0)

## 2019-01-12 ENCOUNTER — Other Ambulatory Visit: Payer: Self-pay

## 2019-01-12 ENCOUNTER — Ambulatory Visit (HOSPITAL_COMMUNITY)
Admission: RE | Admit: 2019-01-12 | Discharge: 2019-01-12 | Disposition: A | Discharge status: Home / Self Care | Payer: Medicare HMO | Source: Ambulatory Visit | Attending: Physician Assistant | Admitting: Physician Assistant

## 2019-01-12 DIAGNOSIS — M7989 Other specified soft tissue disorders: Secondary | ICD-10-CM | POA: Diagnosis not present

## 2019-01-13 ENCOUNTER — Encounter: Payer: Self-pay | Admitting: Physician Assistant

## 2019-01-15 ENCOUNTER — Ambulatory Visit (HOSPITAL_COMMUNITY)
Admission: RE | Admit: 2019-01-15 | Discharge: 2019-01-15 | Disposition: A | Discharge status: Home / Self Care | Payer: Medicare HMO | Source: Ambulatory Visit | Attending: Cardiology | Admitting: Cardiology

## 2019-01-15 ENCOUNTER — Ambulatory Visit (HOSPITAL_BASED_OUTPATIENT_CLINIC_OR_DEPARTMENT_OTHER): Payer: Medicare HMO

## 2019-01-15 ENCOUNTER — Other Ambulatory Visit (HOSPITAL_COMMUNITY): Payer: Self-pay | Admitting: Cardiovascular Disease

## 2019-01-15 ENCOUNTER — Other Ambulatory Visit: Payer: Self-pay

## 2019-01-15 ENCOUNTER — Ambulatory Visit (HOSPITAL_BASED_OUTPATIENT_CLINIC_OR_DEPARTMENT_OTHER)
Admission: RE | Admit: 2019-01-15 | Discharge: 2019-01-15 | Disposition: A | Discharge status: Home / Self Care | Payer: Medicare HMO | Source: Ambulatory Visit | Attending: Cardiovascular Disease | Admitting: Cardiovascular Disease

## 2019-01-15 DIAGNOSIS — M7989 Other specified soft tissue disorders: Secondary | ICD-10-CM | POA: Insufficient documentation

## 2019-01-15 DIAGNOSIS — E669 Obesity, unspecified: Secondary | ICD-10-CM | POA: Diagnosis not present

## 2019-01-15 DIAGNOSIS — I313 Pericardial effusion (noninflammatory): Secondary | ICD-10-CM | POA: Diagnosis not present

## 2019-01-15 DIAGNOSIS — Z8673 Personal history of transient ischemic attack (TIA), and cerebral infarction without residual deficits: Secondary | ICD-10-CM | POA: Insufficient documentation

## 2019-01-15 DIAGNOSIS — I739 Peripheral vascular disease, unspecified: Secondary | ICD-10-CM | POA: Diagnosis not present

## 2019-01-15 DIAGNOSIS — J449 Chronic obstructive pulmonary disease, unspecified: Secondary | ICD-10-CM | POA: Insufficient documentation

## 2019-01-15 DIAGNOSIS — Z95828 Presence of other vascular implants and grafts: Secondary | ICD-10-CM

## 2019-01-15 DIAGNOSIS — E785 Hyperlipidemia, unspecified: Secondary | ICD-10-CM | POA: Insufficient documentation

## 2019-01-15 DIAGNOSIS — F17210 Nicotine dependence, cigarettes, uncomplicated: Secondary | ICD-10-CM | POA: Diagnosis not present

## 2019-01-15 DIAGNOSIS — E1151 Type 2 diabetes mellitus with diabetic peripheral angiopathy without gangrene: Secondary | ICD-10-CM | POA: Diagnosis not present

## 2019-01-15 DIAGNOSIS — I10 Essential (primary) hypertension: Secondary | ICD-10-CM | POA: Diagnosis not present

## 2019-01-16 ENCOUNTER — Other Ambulatory Visit: Payer: Self-pay | Admitting: *Deleted

## 2019-01-16 ENCOUNTER — Telehealth: Payer: Self-pay | Admitting: *Deleted

## 2019-01-16 DIAGNOSIS — I739 Peripheral vascular disease, unspecified: Secondary | ICD-10-CM

## 2019-01-16 NOTE — Telephone Encounter (Signed)
Will address at the follow-up appointment.

## 2019-01-16 NOTE — Telephone Encounter (Signed)
-----   Message from Lorretta Harp, MD sent at 01/15/2019  2:46 PM EDT ----- Normalization ABI status post bilateral iliac "kissing stenting".  Repeat 6 months

## 2019-01-16 NOTE — Telephone Encounter (Signed)
Spoke with pt, aware of berry's recommendations.

## 2019-01-16 NOTE — Telephone Encounter (Signed)
Spoke with pt, regarding dopplers results. She reports swelling in the right foot and leg, smaller amount in the left leg. She was given furosemide 40 mg and potassium by angela duke and that really does not make a difference. Her feet are fine in the morning when she gets up and then by the end of the day she is not able to wear shoes. This started prior to her procedure and is worse now. She denies SOB or orthopnea. She has a follow up appointment 8/12 with dr berry. Will forward to dr berry to review and advise.

## 2019-01-19 ENCOUNTER — Other Ambulatory Visit: Payer: Self-pay | Admitting: *Deleted

## 2019-01-19 DIAGNOSIS — I739 Peripheral vascular disease, unspecified: Secondary | ICD-10-CM

## 2019-01-21 ENCOUNTER — Encounter: Payer: Self-pay | Admitting: Cardiovascular Disease

## 2019-01-21 ENCOUNTER — Other Ambulatory Visit: Payer: Self-pay

## 2019-01-21 ENCOUNTER — Ambulatory Visit (INDEPENDENT_AMBULATORY_CARE_PROVIDER_SITE_OTHER): Payer: Medicare HMO | Admitting: Cardiovascular Disease

## 2019-01-21 DIAGNOSIS — I739 Peripheral vascular disease, unspecified: Secondary | ICD-10-CM | POA: Diagnosis not present

## 2019-01-21 DIAGNOSIS — E782 Mixed hyperlipidemia: Secondary | ICD-10-CM

## 2019-01-21 DIAGNOSIS — I10 Essential (primary) hypertension: Secondary | ICD-10-CM | POA: Diagnosis not present

## 2019-01-21 MED ORDER — HYDROCHLOROTHIAZIDE 12.5 MG PO CAPS: 12.5000 mg | ORAL_CAPSULE | Freq: Every day | ORAL | 3 refills | Status: DC

## 2019-01-21 MED ORDER — AMLODIPINE BESYLATE 5 MG PO TABS: 5.0000 mg | ORAL_TABLET | Freq: Every day | ORAL | 3 refills | Status: DC

## 2019-01-21 NOTE — Assessment & Plan Note (Signed)
History of hyperlipidemia on Crestor with lipid profile performed 01/09/2019 revealing total cholesterol 112, LDL 50 and HDL 40

## 2019-01-21 NOTE — Assessment & Plan Note (Signed)
History of essential hypertension with blood pressure measured today at 122/68.  She is on amlodipine 5 twice daily in addition to losartan and metoprolol.  She does have some lower extremity edema.  I am going to cut her amlodipine to 5 mg once a day.  Months ago and add hydrochlorothiazide 12.5 mg a day and we will check a basic metabolic panel in 10 days.

## 2019-01-21 NOTE — Patient Instructions (Addendum)
Medication Instructions:  Your physician has recommended you make the following change in your medication:   START HYDROCHLOROTHIAZIDE 12.5 MG BY MOUTH DAILY  DECREASE YOUR AMLODIPINE TO 5 MG BY MOUTH A DAY  If you need a refill on your cardiac medications before your next appointment, please call your pharmacy.   Lab work: Your physician recommends that you return for lab work IN 7-10 DAYS: BASIC METABOLIC PANEL  If you have labs (blood work) drawn today and your tests are completely normal, you will receive your results only by: Marland Kitchen MyChart Message (if you have MyChart) OR . A paper copy in the mail If you have any lab test that is abnormal or we need to change your treatment, we will call you to review the results.  Testing/Procedures: Your physician has requested that you have an aorta/iliac duplex. During this test, an ultrasound is used to evaluate blood flow to the aorta and iliac arteries. Allow one hour for this exam. Do not eat after midnight the day before and avoid carbonated beverages. TO BE SCHEDULED FOR 12 MONTHS  Your physician has requested that you have an ankle brachial index (ABI). During this test an ultrasound and blood pressure cuff are used to evaluate the arteries that supply the arms and legs with blood. Allow thirty minutes for this exam. There are no restrictions or special instructions. TO BE SCHEDULED FOR 12 MONTHS  Follow-Up: At [facility], you and your health needs are our priority.  As part of our continuing mission to provide you with exceptional heart care, we have created designated Provider Care Teams.  These Care Teams include your primary Cardiologist (physician) and Advanced Practice Providers (APPs -  Physician Assistants and Nurse Practitioners) who all work together to provide you with the care you need, when you need it. . You will need a follow up appointment in 3 months with Dr. Quay Burow.  Please call our office 2 months in advance to  schedule this/each appointment.

## 2019-01-21 NOTE — Assessment & Plan Note (Signed)
History of peripheral arterial disease with 2-year history of back and leg pain being treated by an orthopedic surgeon and/or neurosurgeon with Dopplers performed 12/16/2018 revealing ABIs in the 0.6 range bilaterally suggesting high-grade bilateral iliac disease.  She underwent angiography by myself 01/01/2019 revealing a high-grade distal abdominal aortic stenosis just proximal to the iliac bifurcation.  I performed bilateral iliac covered stenting using VBX covered stents rebuilding her aortic bifurcation.  Her follow-up Dopplers showed normal ABIs.  Claudication has resolved.  She does have some have some mild swelling in her lower extremities possibly related to the procedure.  I am going to put her on low-dose diuretic and decrease her amlodipine dose.  She is on dual antiplatelet therapy.  We will get lower extremity arterial Doppler studies on her 12 months.

## 2019-01-21 NOTE — Progress Notes (Signed)
01/21/2019 Ashley [name]   1965-12-02  [id number]  Primary Physician Prince Solian, MD Primary Cardiologist: Lorretta Harp MD [name], [place]  HPI:  Ashley [name] is a 53 y.o.  moderately overweight married Caucasian female mother of 1 son, grandmother to one grandchild referred by Dr. Paulla Dolly for peripheral vascular valuation because of poorly palpable pulses and leg pain.  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  She has a history of ongoing tobacco abuse of 4 cigarettes a day, treated hypertension, diabetes and hyperlipidemia.  Her father died of a myocardial infarction at age 60.  She is never had a heart attack or stroke and denies chest pain or shortness of breath.  She has been on insulin pump for last 26 years.  She has had bilateral lower extremity lifestyle and claudication for last 2 years with recent lower extremity Dopplers performed in our office 12/16/2018 revealing a right ABI 0.64 and a left 0.55.  She had high-grade bilateral iliac disease.  I performed peripheral angiography on her 01/01/2019 revealing high-grade distal abdominal aortic stenosis.  I performed bilateral PTA and covered stenting using "kissing stent technique and rebuilding her aortic bifurcation.  Her follow-up Dopplers normalized and her claudication has resolved.  She does have some swelling in her lower extremities potentially related to the increased blood flow as result of her intervention.  I am going to put her on low-dose diuretic and decrease her amlodipine dose.  She remains on dual antiplatelet therapy.  She has cut down her tobacco abuse now smoking only 2 cigarettes a day.    Current Meds  Medication Sig  . ACCU-CHEK COMPACT PLUS test strip 1 each by Other route as needed.   Marland Kitchen ACCU-CHEK SOFTCLIX LANCETS lancets   . albuterol (PROVENTIL) (2.5 MG/3ML) 0.083% nebulizer solution Take 2.5 mg by nebulization every 4 (four) hours as needed  for wheezing or shortness of breath.   . ALPRAZolam (XANAX) 0.5 MG tablet Take 0.5 mg by mouth 3 (three) times daily as needed for anxiety.   . AMBULATORY NON FORMULARY MEDICATION NOVOLOG INSULIN PUMP USES AS DIRECTED  . amLODipine (NORVASC) 5 MG tablet Take 5 mg by mouth 2 (two) times a day.   Marland Kitchen aspirin EC 81 MG EC tablet Take 1 tablet (81 mg total) by mouth daily.  . Azelastine HCl 0.15 % SOLN Place 1 spray into the nose daily.  . busPIRone (BUSPAR) 15 MG tablet Take 15 mg by mouth 2 (two) times daily.  . clopidogrel (PLAVIX) 75 MG tablet Take 1 tablet (75 mg total) by mouth daily with breakfast.  . cyanocobalamin (,VITAMIN B-12,) 1000 MCG/ML injection Inject 1,000 mcg into the muscle every 30 (thirty) days.   . DULoxetine (CYMBALTA) 30 MG capsule Take 30 mg by mouth every morning. Takes 90 mg total  . DULoxetine (CYMBALTA) 60 MG capsule Take 60 mg by mouth every morning. Takes 90 mg total  . famotidine (PEPCID) 20 MG tablet Take 20 mg by mouth at bedtime.  . fluticasone (FLONASE) 50 MCG/ACT nasal spray Place 1 spray into the nose daily.   . furosemide (LASIX) 40 MG tablet Take 1 tablet (40 mg total) by mouth daily. For 5 days  . HYDROcodone-acetaminophen (NORCO) 10-325 MG tablet Take 1 tablet by mouth every 6 (six) hours as needed for moderate pain.  Marland Kitchen lansoprazole (PREVACID) 30 MG capsule Take 30 mg by mouth daily.  Marland Kitchen latanoprost (  XALATAN) 0.005 % ophthalmic solution Place 1 drop into both eyes at bedtime.  Marland Kitchen levocetirizine (XYZAL) 5 MG tablet Take 5 mg by mouth every morning.   Marland Kitchen levothyroxine (SYNTHROID, LEVOTHROID) 112 MCG tablet Take 112 mcg by mouth daily before breakfast.   . losartan (COZAAR) 100 MG tablet Take 100 mg by mouth at bedtime.   . methocarbamol (ROBAXIN) 750 MG tablet Take 750 mg by mouth every 8 (eight) hours as needed for muscle spasms.   . metoprolol succinate (TOPROL-XL) 50 MG 24 hr tablet Take 50 mg by mouth daily. One tablet by mouth once daily   . montelukast  (SINGULAIR) 10 MG tablet Take 10 mg by mouth at bedtime.  Marland Kitchen NOVOLOG 100 UNIT/ML injection Inject into the skin See admin instructions. Pt has Insulin pump  . potassium chloride SA (K-DUR) 20 MEQ tablet Take 1 tablet (20 mEq total) by mouth daily. For 5 days  . pregabalin (LYRICA) 75 MG capsule Take 75 mg by mouth.   Marland Kitchen PROAIR HFA 108 (90 BASE) MCG/ACT inhaler Inhale 2 puffs into the lungs every 4 (four) hours as needed for wheezing or shortness of breath.   . promethazine (PHENERGAN) 25 MG tablet Take 25 mg by mouth every 4 (four) hours as needed for nausea or vomiting. Every 4-6 hours as needed  . rosuvastatin (CRESTOR) 20 MG tablet Take 20 mg by mouth at bedtime.  . Vitamin D, Ergocalciferol, (DRISDOL) 50000 UNITS CAPS Take 50,000 Units by mouth every 7 (seven) days.     Allergies  Allergen Reactions  . Clindamycin Hives  . Other Hives    Tylox   . Sulfonamide Derivatives Hives  . Lipitor [Atorvastatin] Other (See Comments)    MUSCLE ACHES  . Restasis [Cyclosporine]     Pt stated, "My eyes turned red on the inside and outside of eye; burning sensation"  . Topamax [Topiramate] Itching    Social History   Socioeconomic History  . Marital status: Married    Spouse name: Not on file  . Number of children: Not on file  . Years of education: Not on file  . Highest education level: Not on file  Occupational History  . Occupation: Occupational psychologist  Social Needs  . Financial resource strain: Not on file  . Food insecurity    Worry: Not on file    Inability: Not on file  . Transportation needs    Medical: Not on file    Non-medical: Not on file  Tobacco Use  . Smoking status: Current Some Day Smoker    Packs/day: 0.50    Years: 25.00    Pack years: 12.50    Types: Cigarettes  . Smokeless tobacco: Never Used  . Tobacco comment: 6-8 CIGS A DAY  Substance and Sexual Activity  . Alcohol use: No    Alcohol/week: 0.0 standard drinks  . Drug use: No  . Sexual activity: Not on file   Lifestyle  . Physical activity    Days per week: Not on file    Minutes per session: Not on file  . Stress: Not on file  Relationships  . Social Herbalist on phone: Not on file    Gets together: Not on file    Attends religious service: Not on file    Active member of club or organization: Not on file    Attends meetings of clubs or organizations: Not on file    Relationship status: Not on file  . Intimate partner violence  Fear of current or ex partner: Not on file    Emotionally abused: Not on file    Physically abused: Not on file    Forced sexual activity: Not on file  Other Topics Concern  . Not on file  Social History Narrative   Lives home with husband.  Education 12th grade.  Caffeine 4-5 drinks daily.     Review of Systems: General: negative for chills, fever, night sweats or weight changes.  Cardiovascular: negative for chest pain, dyspnea on exertion, edema, orthopnea, palpitations, paroxysmal nocturnal dyspnea or shortness of breath Dermatological: negative for rash Respiratory: negative for cough or wheezing Urologic: negative for hematuria Abdominal: negative for nausea, vomiting, diarrhea, bright red blood per rectum, melena, or hematemesis Neurologic: negative for visual changes, syncope, or dizziness All other systems reviewed and are otherwise negative except as noted above.    Blood pressure 122/68, pulse 85, temperature 97.7 F (36.5 C), temperature source Temporal, height 4' 11.5" (1.511 m), weight 150 lb 9.6 oz (68.3 kg).  General appearance: alert and no distress Neck: no adenopathy, no carotid bruit, no JVD, supple, symmetrical, trachea midline and thyroid not enlarged, symmetric, no tenderness/mass/nodules Lungs: clear to auscultation bilaterally Heart: regular rate and rhythm, S1, S2 normal, no murmur, click, rub or gallop Extremities: 1+ edema bilaterally Pulses: 2+ and symmetric Skin: Mild erythema both lower extremities  Neurologic: Alert and oriented X 3, normal strength and tone. Normal symmetric reflexes. Normal coordination and gait  EKG not performed today  ASSESSMENT AND PLAN:   Essential hypertension History of essential hypertension with blood pressure measured today at 122/68.  She is on amlodipine 5 twice daily in addition to losartan and metoprolol.  She does have some lower extremity edema.  I am going to cut her amlodipine to 5 mg once a day.  Months ago and add hydrochlorothiazide 12.5 mg a day and we will check a basic metabolic panel in 10 days.  Hyperlipidemia History of hyperlipidemia on Crestor with lipid profile performed 01/09/2019 revealing total cholesterol 112, LDL 50 and HDL 40  Claudication in peripheral vascular disease ([place]) History of peripheral arterial disease with 2-year history of back and leg pain being treated by an orthopedic surgeon and/or neurosurgeon with Dopplers performed 12/16/2018 revealing ABIs in the 0.6 range bilaterally suggesting high-grade bilateral iliac disease.  She underwent angiography by myself 01/01/2019 revealing a high-grade distal abdominal aortic stenosis just proximal to the iliac bifurcation.  I performed bilateral iliac covered stenting using VBX covered stents rebuilding her aortic bifurcation.  Her follow-up Dopplers showed normal ABIs.  Claudication has resolved.  She does have some have some mild swelling in her lower extremities possibly related to the procedure.  I am going to put her on low-dose diuretic and decrease her amlodipine dose.  She is on dual antiplatelet therapy.  We will get lower extremity arterial Doppler studies on her 12 months.      Lorretta Harp MD FACP,FACC,FAHA, [facility] 01/21/2019 12:19 PM

## 2019-01-22 ENCOUNTER — Other Ambulatory Visit: Payer: Self-pay | Admitting: Cardiovascular Disease

## 2019-01-22 NOTE — Telephone Encounter (Signed)
°*  STAT* If patient is at the pharmacy, call can be transferred to refill team.   1. Which medications need to be refilled? (please list name of each medication and dose if known)  Hydrochlorothiazide  2. Which pharmacy/location (including street and city if local pharmacy) is medication to be sent to [facility]- [phone]  3. Do they need a 30 day or 90 day supply? 30 days

## 2019-01-23 MED ORDER — HYDROCHLOROTHIAZIDE 12.5 MG PO CAPS: 12.5000 mg | ORAL_CAPSULE | Freq: Every day | ORAL | 6 refills | Status: DC

## 2019-01-23 NOTE — Telephone Encounter (Signed)
LVM letting patient know that I sent a 30 supply of her Hydrochloroth[facility]ide to [facility] on [address].

## 2019-01-30 DIAGNOSIS — I10 Essential (primary) hypertension: Secondary | ICD-10-CM | POA: Diagnosis not present

## 2019-01-31 LAB — BASIC METABOLIC PANEL
BUN/Creatinine Ratio: 13 (ref 9–23)
BUN: 11 mg/dL (ref 6–24)
CO2: 22 mmol/L (ref 20–29)
Calcium: 9.8 mg/dL (ref 8.7–10.2)
Chloride: 103 mmol/L (ref 96–106)
Creatinine, Ser: 0.83 mg/dL (ref 0.57–1.00)
GFR calc Af Amer: 93 mL/min/{1.73_m2} (ref >59)
GFR calc non Af Amer: 81 mL/min/{1.73_m2} (ref >59)
Glucose: 116 mg/dL — ABNORMAL HIGH (ref 65–99)
Potassium: 4.3 mmol/L (ref 3.5–5.2)
Sodium: 142 mmol/L (ref 134–144)

## 2019-02-02 ENCOUNTER — Encounter (HOSPITAL_COMMUNITY): Payer: Medicare HMO

## 2019-02-02 ENCOUNTER — Other Ambulatory Visit: Payer: Self-pay

## 2019-02-02 MED ORDER — POTASSIUM CHLORIDE CRYS ER 20 MEQ PO TBCR: 20.0000 meq | EXTENDED_RELEASE_TABLET | Freq: Every day | ORAL | 1 refills | Status: DC

## 2019-02-02 MED ORDER — FUROSEMIDE 40 MG PO TABS: 40.0000 mg | ORAL_TABLET | Freq: Every day | ORAL | 1 refills | Status: DC

## 2019-02-03 DIAGNOSIS — M25522 Pain in left elbow: Secondary | ICD-10-CM | POA: Diagnosis not present

## 2019-02-03 DIAGNOSIS — M778 Other enthesopathies, not elsewhere classified: Secondary | ICD-10-CM | POA: Diagnosis not present

## 2019-02-03 DIAGNOSIS — I739 Peripheral vascular disease, unspecified: Secondary | ICD-10-CM | POA: Diagnosis not present

## 2019-02-04 DIAGNOSIS — G4733 Obstructive sleep apnea (adult) (pediatric): Secondary | ICD-10-CM | POA: Diagnosis not present

## 2019-02-08 DIAGNOSIS — G4733 Obstructive sleep apnea (adult) (pediatric): Secondary | ICD-10-CM | POA: Diagnosis not present

## 2019-02-23 DIAGNOSIS — H2512 Age-related nuclear cataract, left eye: Secondary | ICD-10-CM | POA: Diagnosis not present

## 2019-02-23 DIAGNOSIS — H25812 Combined forms of age-related cataract, left eye: Secondary | ICD-10-CM | POA: Diagnosis not present

## 2019-02-23 DIAGNOSIS — H25012 Cortical age-related cataract, left eye: Secondary | ICD-10-CM | POA: Diagnosis not present

## 2019-02-26 DIAGNOSIS — Z794 Long term (current) use of insulin: Secondary | ICD-10-CM | POA: Diagnosis not present

## 2019-02-26 DIAGNOSIS — E1040 Type 1 diabetes mellitus with diabetic neuropathy, unspecified: Secondary | ICD-10-CM | POA: Diagnosis not present

## 2019-02-26 DIAGNOSIS — Z4681 Encounter for fitting and adjustment of insulin pump: Secondary | ICD-10-CM | POA: Diagnosis not present

## 2019-02-26 DIAGNOSIS — I10 Essential (primary) hypertension: Secondary | ICD-10-CM | POA: Diagnosis not present

## 2019-02-26 DIAGNOSIS — Z23 Encounter for immunization: Secondary | ICD-10-CM | POA: Diagnosis not present

## 2019-03-09 DIAGNOSIS — H25811 Combined forms of age-related cataract, right eye: Secondary | ICD-10-CM | POA: Diagnosis not present

## 2019-03-09 DIAGNOSIS — H2511 Age-related nuclear cataract, right eye: Secondary | ICD-10-CM | POA: Diagnosis not present

## 2019-03-09 DIAGNOSIS — H25011 Cortical age-related cataract, right eye: Secondary | ICD-10-CM | POA: Diagnosis not present

## 2019-03-11 DIAGNOSIS — G4733 Obstructive sleep apnea (adult) (pediatric): Secondary | ICD-10-CM | POA: Diagnosis not present

## 2019-03-13 ENCOUNTER — Other Ambulatory Visit: Payer: Self-pay | Admitting: Cardiovascular Disease

## 2019-03-13 MED ORDER — CLOPIDOGREL BISULFATE 75 MG PO TABS: 75.0000 mg | ORAL_TABLET | Freq: Every day | ORAL | 3 refills | Status: DC

## 2019-03-13 NOTE — Telephone Encounter (Signed)
Pt's medication was sent to pt's pharmacy as requested. Confirmation received.  °

## 2019-04-10 DIAGNOSIS — H3581 Retinal edema: Secondary | ICD-10-CM | POA: Diagnosis not present

## 2019-04-10 DIAGNOSIS — H30033 Focal chorioretinal inflammation, peripheral, bilateral: Secondary | ICD-10-CM | POA: Insufficient documentation

## 2019-04-10 DIAGNOSIS — E109 Type 1 diabetes mellitus without complications: Secondary | ICD-10-CM | POA: Diagnosis not present

## 2019-04-10 DIAGNOSIS — G4733 Obstructive sleep apnea (adult) (pediatric): Secondary | ICD-10-CM | POA: Diagnosis not present

## 2019-04-10 DIAGNOSIS — Z961 Presence of intraocular lens: Secondary | ICD-10-CM | POA: Diagnosis not present

## 2019-04-17 DIAGNOSIS — H30033 Focal chorioretinal inflammation, peripheral, bilateral: Secondary | ICD-10-CM | POA: Diagnosis not present

## 2019-04-24 ENCOUNTER — Ambulatory Visit (INDEPENDENT_AMBULATORY_CARE_PROVIDER_SITE_OTHER): Payer: Medicare HMO | Admitting: Cardiovascular Disease

## 2019-04-24 ENCOUNTER — Encounter: Payer: Self-pay | Admitting: Cardiovascular Disease

## 2019-04-24 ENCOUNTER — Other Ambulatory Visit: Payer: Self-pay

## 2019-04-24 VITALS — BP 120/72 | HR 86 | Ht 59.5 in | Wt 153.0 lb

## 2019-04-24 DIAGNOSIS — I739 Peripheral vascular disease, unspecified: Secondary | ICD-10-CM | POA: Diagnosis not present

## 2019-04-24 DIAGNOSIS — E782 Mixed hyperlipidemia: Secondary | ICD-10-CM

## 2019-04-24 DIAGNOSIS — I10 Essential (primary) hypertension: Secondary | ICD-10-CM

## 2019-04-24 NOTE — Addendum Note (Signed)
Addended by: Cain Sieve on: 04/24/2019 10:30 AM   Modules accepted: Orders

## 2019-04-24 NOTE — Patient Instructions (Addendum)
Medication Instructions:  Your physician recommends that you continue on your current medications as directed. Please refer to the Current Medication list given to you today.  If you need a refill on your cardiac medications before your next appointment, please call your pharmacy.   Lab work: NONE  Testing/Procedures: Your physician has requested that you have a lower extremity arterial exercise duplex with ABI's in one year. During this test, exercise and ultrasound are used to evaluate arterial blood flow in the legs. Allow one hour for this exam. There are no restrictions or special instructions.   Follow-Up: At [facility], you and your health needs are our priority.  As part of our continuing mission to provide you with exceptional heart care, we have created designated Provider Care Teams.  These Care Teams include your primary Cardiologist (physician) and Advanced Practice Providers (APPs -  Physician Assistants and Nurse Practitioners) who all work together to provide you with the care you need, when you need it. You may see Quay Burow, MD or one of the following Advanced Practice Providers on your designated Care Team:    Kerin Ransom, PA-C  [place], [place]  Coletta Memos, [place]   Your physician wants you to follow-up in: one year after ultrasounds. You will receive a reminder letter in the mail two months in advance. If you don't receive a letter, please call our office to schedule the follow-up appointment.

## 2019-04-24 NOTE — Assessment & Plan Note (Signed)
History essential hypertension blood pressure measured today 120/72.  She is on hydrochlorothiazide, losartan and metoprolol.

## 2019-04-24 NOTE — Assessment & Plan Note (Signed)
History of hyperlipidemia on statin therapy with lipid profile performed 02/26/2019 revealing total cholesterol of 163, LDL 78 and HDL 35.

## 2019-04-24 NOTE — Assessment & Plan Note (Signed)
History of peripheral arterial disease status post bilateral iliac covered stenting by myself 01/01/2019 with VBX stents for severe aortoiliac disease.  This was done because of back and leg pain.  Her symptoms completely resolved and her Dopplers normalized after that.  She currently denies claudication.

## 2019-04-24 NOTE — Progress Notes (Signed)
04/24/2019 Ashley [name]   Nov 19, 1965  [id number]  Primary Physician Ashley Solian, MD Primary Cardiologist: Ashley Harp MD Ashley [name], [place]  HPI:  Ashley [name] is a 53 y.o. moderately overweight married Caucasian female mother of 1 son, grandmother to one grandchild referred by Dr. Paulla [name] for peripheral vascular valuation because of poorly palpable pulses and leg pain.  I last saw her in the office 01/21/2019. 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 She has a history of ongoing tobacco abuse of 4 cigarettes a day, treated hypertension, diabetes and hyperlipidemia. Her father died of a myocardial infarction at age 49. She is never had a heart attack or stroke and denies chest pain or shortness of breath. She has been on insulin pump for last 26 years. She has had bilateral lower extremity lifestyle and claudication for last 2 years prior to her intervention treated as orthopedic or neurovascular pain, with recent lower extremity Dopplers performed in our office 12/16/2018 revealing a right ABI 0.64 and a left 0.55. She had high-grade bilateral iliac disease.  I performed peripheral angiography on her 01/01/2019 revealing high-grade distal abdominal aortic stenosis.  I performed bilateral PTA and covered stenting using "kissing stent technique and rebuilding her aortic bifurcation.  Her follow-up Dopplers normalized and her claudication has resolved.  She does have some swelling in her lower extremities potentially related to the increased blood flow as result of her intervention.  I am going to put her on low-dose diuretic and decrease her amlodipine dose.  She remains on dual antiplatelet therapy.  Continues to smoke however.  Since I saw her in the office 3 months ago she continues to do well.  She denies chest pain, shortness of breath or claudication.  Current Meds  Medication Sig  . ACCU-CHEK COMPACT PLUS test strip 1  each by Other route as needed.   Marland Kitchen ACCU-CHEK SOFTCLIX LANCETS lancets   . albuterol (PROVENTIL) (2.5 MG/3ML) 0.083% nebulizer solution Take 2.5 mg by nebulization every 4 (four) hours as needed for wheezing or shortness of breath.   . ALPRAZolam (XANAX) 0.5 MG tablet Take 0.5 mg by mouth 3 (three) times daily as needed for anxiety.   . AMBULATORY NON FORMULARY MEDICATION NOVOLOG INSULIN PUMP USES AS DIRECTED  . amLODipine (NORVASC) 5 MG tablet Take 1 tablet (5 mg total) by mouth daily.  Marland Kitchen aspirin EC 81 MG EC tablet Take 1 tablet (81 mg total) by mouth daily.  . Azelastine HCl 0.15 % SOLN Place 1 spray into the nose daily.  . busPIRone (BUSPAR) 15 MG tablet Take 15 mg by mouth 2 (two) times daily.  . clopidogrel (PLAVIX) 75 MG tablet Take 1 tablet (75 mg total) by mouth daily with breakfast.  . cyanocobalamin (,VITAMIN B-12,) 1000 MCG/ML injection Inject 1,000 mcg into the muscle every 30 (thirty) days.   . DULoxetine (CYMBALTA) 30 MG capsule Take 30 mg by mouth every morning. Takes 90 mg total  . DULoxetine (CYMBALTA) 60 MG capsule Take 60 mg by mouth every morning. Takes 90 mg total  . famotidine (PEPCID) 20 MG tablet Take 20 mg by mouth at bedtime.  . fluticasone (FLONASE) 50 MCG/ACT nasal spray Place 1 spray into the nose daily.   . hydrochlorothiazide (MICROZIDE) 12.5 MG capsule Take 1 capsule (12.5 mg total) by mouth daily.  Marland Kitchen HYDROcodone-acetaminophen (NORCO) 10-325 MG tablet Take 1 tablet by mouth every 6 (six) hours as  needed for moderate pain.  Marland Kitchen lansoprazole (PREVACID) 30 MG capsule Take 30 mg by mouth daily.  Marland Kitchen latanoprost (XALATAN) 0.005 % ophthalmic solution Place 1 drop into both eyes at bedtime.  Marland Kitchen levocetirizine (XYZAL) 5 MG tablet Take 5 mg by mouth every morning.   Marland Kitchen levothyroxine (SYNTHROID, LEVOTHROID) 112 MCG tablet Take 112 mcg by mouth daily before breakfast.   . losartan (COZAAR) 100 MG tablet Take 100 mg by mouth at bedtime.   . methocarbamol (ROBAXIN) 750 MG tablet  Take 750 mg by mouth every 8 (eight) hours as needed for muscle spasms.   . metoprolol succinate (TOPROL-XL) 50 MG 24 hr tablet Take 50 mg by mouth daily. One tablet by mouth once daily   . montelukast (SINGULAIR) 10 MG tablet Take 10 mg by mouth at bedtime.  Marland Kitchen NOVOLOG 100 UNIT/ML injection Inject into the skin See admin instructions. Pt has Insulin pump  . pregabalin (LYRICA) 75 MG capsule Take 75 mg by mouth.   Marland Kitchen PROAIR HFA 108 (90 BASE) MCG/ACT inhaler Inhale 2 puffs into the lungs every 4 (four) hours as needed for wheezing or shortness of breath.   . promethazine (PHENERGAN) 25 MG tablet Take 25 mg by mouth every 4 (four) hours as needed for nausea or vomiting. Every 4-6 hours as needed  . rosuvastatin (CRESTOR) 20 MG tablet Take 20 mg by mouth at bedtime.  . Vitamin D, Ergocalciferol, (DRISDOL) 50000 UNITS CAPS Take 50,000 Units by mouth every 7 (seven) days.     Allergies  Allergen Reactions  . Clindamycin Hives  . Other Hives    Tylox   . Sulfonamide Derivatives Hives  . Lipitor [Atorvastatin] Other (See Comments)    MUSCLE ACHES  . Restasis [Cyclosporine]     Pt stated, "My eyes turned red on the inside and outside of eye; burning sensation"  . Topamax [Topiramate] Itching    Social History   Socioeconomic History  . Marital status: Married    Spouse name: Not on file  . Number of children: Not on file  . Years of education: Not on file  . Highest education level: Not on file  Occupational History  . Occupation: Occupational psychologist  Social Needs  . Financial resource strain: Not on file  . Food insecurity    Worry: Not on file    Inability: Not on file  . Transportation needs    Medical: Not on file    Non-medical: Not on file  Tobacco Use  . Smoking status: Current Some Day Smoker    Packs/day: 0.50    Years: 25.00    Pack years: 12.50    Types: Cigarettes  . Smokeless tobacco: Never Used  . Tobacco comment: 6-8 CIGS A DAY  Substance and Sexual Activity  .  Alcohol use: No    Alcohol/week: 0.0 standard drinks  . Drug use: No  . Sexual activity: Not on file  Lifestyle  . Physical activity    Days per week: Not on file    Minutes per session: Not on file  . Stress: Not on file  Relationships  . Social Herbalist on phone: Not on file    Gets together: Not on file    Attends religious service: Not on file    Active member of club or organization: Not on file    Attends meetings of clubs or organizations: Not on file    Relationship status: Not on file  . Intimate partner violence  Fear of current or ex partner: Not on file    Emotionally abused: Not on file    Physically abused: Not on file    Forced sexual activity: Not on file  Other Topics Concern  . Not on file  Social History Narrative   Lives home with husband.  Education 12th grade.  Caffeine 4-5 drinks daily.     Review of Systems: General: negative for chills, fever, night sweats or weight changes.  Cardiovascular: negative for chest pain, dyspnea on exertion, edema, orthopnea, palpitations, paroxysmal nocturnal dyspnea or shortness of breath Dermatological: negative for rash Respiratory: negative for cough or wheezing Urologic: negative for hematuria Abdominal: negative for nausea, vomiting, diarrhea, bright red blood per rectum, melena, or hematemesis Neurologic: negative for visual changes, syncope, or dizziness All other systems reviewed and are otherwise negative except as noted above.    Blood pressure 120/72, pulse 86, height 4' 11.5" (1.511 m), weight 153 lb (69.4 kg), SpO2 96 %.  General appearance: alert and no distress Neck: no adenopathy, no carotid bruit, no JVD, supple, symmetrical, trachea midline and thyroid not enlarged, symmetric, no tenderness/mass/nodules Lungs: clear to auscultation bilaterally Heart: regular rate and rhythm, S1, S2 normal, no murmur, click, rub or gallop Extremities: extremities normal, atraumatic, no cyanosis or edema  Pulses: 2+ and symmetric Skin: Skin color, texture, turgor normal. No rashes or lesions Neurologic: Alert and oriented X 3, normal strength and tone. Normal symmetric reflexes. Normal coordination and gait  EKG not performed today  ASSESSMENT AND PLAN:   Essential hypertension History essential hypertension blood pressure measured today 120/72.  She is on hydrochlorothiazide, losartan and metoprolol.  Hyperlipidemia History of hyperlipidemia on statin therapy with lipid profile performed 02/26/2019 revealing total cholesterol of 163, LDL 78 and HDL 35.  Peripheral arterial disease ([facility]) History of peripheral arterial disease status post bilateral iliac covered stenting by myself 01/01/2019 with VBX stents for severe aortoiliac disease.  This was done because of back and leg pain.  Her symptoms completely resolved and her Dopplers normalized after that.  She currently denies claudication.      Ashley Harp MD FACP,FACC,FAHA, [facility] 04/24/2019 10:26 AM

## 2019-04-27 DIAGNOSIS — E109 Type 1 diabetes mellitus without complications: Secondary | ICD-10-CM | POA: Diagnosis not present

## 2019-04-28 DIAGNOSIS — H3581 Retinal edema: Secondary | ICD-10-CM | POA: Diagnosis not present

## 2019-04-28 DIAGNOSIS — H30033 Focal chorioretinal inflammation, peripheral, bilateral: Secondary | ICD-10-CM | POA: Diagnosis not present

## 2019-05-11 DIAGNOSIS — I10 Essential (primary) hypertension: Secondary | ICD-10-CM | POA: Diagnosis not present

## 2019-05-11 DIAGNOSIS — F418 Other specified anxiety disorders: Secondary | ICD-10-CM | POA: Diagnosis not present

## 2019-05-11 DIAGNOSIS — E0500 Thyrotoxicosis with diffuse goiter without thyrotoxic crisis or storm: Secondary | ICD-10-CM | POA: Diagnosis not present

## 2019-05-11 DIAGNOSIS — H15003 Unspecified scleritis, bilateral: Secondary | ICD-10-CM | POA: Diagnosis not present

## 2019-05-11 DIAGNOSIS — E1040 Type 1 diabetes mellitus with diabetic neuropathy, unspecified: Secondary | ICD-10-CM | POA: Diagnosis not present

## 2019-05-11 DIAGNOSIS — E785 Hyperlipidemia, unspecified: Secondary | ICD-10-CM | POA: Diagnosis not present

## 2019-05-11 DIAGNOSIS — I739 Peripheral vascular disease, unspecified: Secondary | ICD-10-CM | POA: Diagnosis not present

## 2019-05-11 DIAGNOSIS — G4733 Obstructive sleep apnea (adult) (pediatric): Secondary | ICD-10-CM | POA: Diagnosis not present

## 2019-05-11 DIAGNOSIS — F17200 Nicotine dependence, unspecified, uncomplicated: Secondary | ICD-10-CM | POA: Diagnosis not present

## 2019-05-11 DIAGNOSIS — K3184 Gastroparesis: Secondary | ICD-10-CM | POA: Diagnosis not present

## 2019-05-26 DIAGNOSIS — H3581 Retinal edema: Secondary | ICD-10-CM | POA: Diagnosis not present

## 2019-05-26 DIAGNOSIS — H30033 Focal chorioretinal inflammation, peripheral, bilateral: Secondary | ICD-10-CM | POA: Diagnosis not present

## 2019-06-03 DIAGNOSIS — R05 Cough: Secondary | ICD-10-CM | POA: Diagnosis not present

## 2019-06-03 DIAGNOSIS — Z9189 Other specified personal risk factors, not elsewhere classified: Secondary | ICD-10-CM | POA: Diagnosis not present

## 2019-06-08 DIAGNOSIS — G4733 Obstructive sleep apnea (adult) (pediatric): Secondary | ICD-10-CM | POA: Diagnosis not present

## 2019-06-10 DIAGNOSIS — G4733 Obstructive sleep apnea (adult) (pediatric): Secondary | ICD-10-CM | POA: Diagnosis not present

## 2019-06-23 DIAGNOSIS — Z961 Presence of intraocular lens: Secondary | ICD-10-CM | POA: Diagnosis not present

## 2019-06-23 DIAGNOSIS — H3581 Retinal edema: Secondary | ICD-10-CM | POA: Diagnosis not present

## 2019-06-23 DIAGNOSIS — E109 Type 1 diabetes mellitus without complications: Secondary | ICD-10-CM | POA: Diagnosis not present

## 2019-06-23 DIAGNOSIS — H30033 Focal chorioretinal inflammation, peripheral, bilateral: Secondary | ICD-10-CM | POA: Diagnosis not present

## 2019-07-08 ENCOUNTER — Ambulatory Visit: Payer: Medicare HMO | Admitting: Nurse Practitioner

## 2019-07-08 ENCOUNTER — Other Ambulatory Visit (INDEPENDENT_AMBULATORY_CARE_PROVIDER_SITE_OTHER): Payer: Medicare HMO

## 2019-07-08 ENCOUNTER — Encounter: Payer: Self-pay | Admitting: Nurse Practitioner

## 2019-07-08 ENCOUNTER — Other Ambulatory Visit: Payer: Self-pay

## 2019-07-08 VITALS — BP 122/74 | HR 76 | Temp 98.0°F | Ht 59.0 in | Wt 153.4 lb

## 2019-07-08 DIAGNOSIS — R112 Nausea with vomiting, unspecified: Secondary | ICD-10-CM

## 2019-07-08 DIAGNOSIS — K219 Gastro-esophageal reflux disease without esophagitis: Secondary | ICD-10-CM

## 2019-07-08 DIAGNOSIS — Z1211 Encounter for screening for malignant neoplasm of colon: Secondary | ICD-10-CM

## 2019-07-08 DIAGNOSIS — Z01818 Encounter for other preprocedural examination: Secondary | ICD-10-CM | POA: Diagnosis not present

## 2019-07-08 DIAGNOSIS — M069 Rheumatoid arthritis, unspecified: Secondary | ICD-10-CM | POA: Diagnosis not present

## 2019-07-08 LAB — COMPREHENSIVE METABOLIC PANEL
ALT: 21 U/L (ref 0–35)
AST: 16 U/L (ref 0–37)
Albumin: 4.6 g/dL (ref 3.5–5.2)
Alkaline Phosphatase: 104 U/L (ref 39–117)
BUN: 11 mg/dL (ref 6–23)
CO2: 29 mEq/L (ref 19–32)
Calcium: 10.1 mg/dL (ref 8.4–10.5)
Chloride: 105 mEq/L (ref 96–112)
Creatinine, Ser: 0.84 mg/dL (ref 0.40–1.20)
GFR: 70.66 mL/min (ref >60.00)
Glucose, Bld: 90 mg/dL (ref 70–99)
Potassium: 4.8 mEq/L (ref 3.5–5.1)
Sodium: 141 mEq/L (ref 135–145)
Total Bilirubin: 0.3 mg/dL (ref 0.2–1.2)
Total Protein: 7.3 g/dL (ref 6.0–8.3)

## 2019-07-08 LAB — CBC WITH DIFFERENTIAL/PLATELET
Basophils Absolute: 0.2 10*3/uL — ABNORMAL HIGH (ref 0.0–0.1)
Basophils Relative: 1.4 % (ref 0.0–3.0)
Eosinophils Absolute: 0.4 10*3/uL (ref 0.0–0.7)
Eosinophils Relative: 3.2 % (ref 0.0–5.0)
HCT: 38.6 % (ref 36.0–46.0)
Hemoglobin: 12.4 g/dL (ref 12.0–15.0)
Lymphocytes Relative: 38.9 % (ref 12.0–46.0)
Lymphs Abs: 4.4 10*3/uL — ABNORMAL HIGH (ref 0.7–4.0)
MCHC: 32.2 g/dL (ref 30.0–36.0)
MCV: 85.6 fl (ref 78.0–100.0)
Monocytes Absolute: 0.8 10*3/uL (ref 0.1–1.0)
Monocytes Relative: 6.6 % (ref 3.0–12.0)
Neutro Abs: 5.7 10*3/uL (ref 1.4–7.7)
Neutrophils Relative %: 49.9 % (ref 43.0–77.0)
Platelets: 332 10*3/uL (ref 150.0–400.0)
RBC: 4.51 Mil/uL (ref 3.87–5.11)
RDW: 16.9 % — ABNORMAL HIGH (ref 11.5–15.5)
WBC: 11.4 10*3/uL — ABNORMAL HIGH (ref 4.0–10.5)

## 2019-07-08 LAB — HIGH SENSITIVITY CRP: CRP, High Sensitivity: 3.95 mg/L (ref 0.000–5.000)

## 2019-07-08 LAB — LIPASE: Lipase: 14 U/L (ref 11.0–59.0)

## 2019-07-08 MED ORDER — ONDANSETRON HCL 4 MG PO TABS: 4.0000 mg | ORAL_TABLET | Freq: Three times a day (TID) | ORAL | 0 refills | Status: DC | PRN

## 2019-07-08 MED ORDER — NA SULFATE-K SULFATE-MG SULF 17.5-3.13-1.6 GM/177ML PO SOLN: 1.0000 | Freq: Once | ORAL | 0 refills | Status: AC

## 2019-07-08 NOTE — Patient Instructions (Addendum)
If you are age 54 or older, your body mass index should be between 23-30. Your Body mass index is 30.98 kg/m. If this is out of the aforementioned range listed, please consider follow up with your Primary Care Provider.  If you are age 42 or younger, your body mass index should be between 19-25. Your Body mass index is 30.98 kg/m. If this is out of the aformentioned range listed, please consider follow up with your Primary Care Provider.     You will be contaced by our office prior to your procedure for directions on holding your Plavix.  If you do not hear from our office 1 week prior to your scheduled procedure, please call [phone] to discuss.   You will be contaced by our office prior to your procedure for directions on holding your Plavix.  If you do not hear from our office 1 week prior to your scheduled procedure, please call [phone] to discuss.  We will contact you in regards to your insulin instructions, If you do not hear from [place] within the week before your procedure please call the office.  We have sent the following medications to your pharmacy for you to pick up at your convenience: Ondasetron 4 mg  Continue taking famotadine 20 mg 2 tablets  before bed  You have been scheduled for an abdominal ultrasound at [facility] (1st floor of hospital) on 07/15/2019 at 9:30am. Please arrive 15 minutes prior to your appointment for registration. Make certain not to have anything to eat or drink 6 hours prior to your appointment. Should you need to reschedule your appointment, please contact radiology at [phone]. This test typically takes about 30 minutes to perform.  Due to recent changes in healthcare laws, you may see the results of your imaging and laboratory studies on MyChart before your provider has had a chance to review them.  We understand that in some cases there may be results that are confusing or concerning to you. Not all laboratory results come back in  the same time frame and the provider may be waiting for multiple results in order to interpret others.  Please give [place] 48 hours in order for your provider to thoroughly review all the results before contacting the office for clarification of your results.

## 2019-07-08 NOTE — Progress Notes (Signed)
07/08/2019 Ashley [name] [id number] 1966/01/21    HISTORY OF PRESENT ILLNESS: Ashley [name] is a 54 year old female with a past medical history of anxiety, depression, asthma, DM on an insulin pump x 26 years, diabetic neuropathy, gastroparesis, peripheral artery disease s/p right and left iliac stents by Dr. Quay Burow 01/01/2019 on Plavix and ASA, sleep apnea uses Cpap, rheumatoid arthritis with occular involvement on Methotrexate, glaucoma, hypothyroidism, esophageal stricture and GERD. Past hysterectomy and benign left breast lumpectomy. She presents today for further evaluation for nausea and vomiting which started 3 1/2 weeks ago. She vomits shortly after she eats. Her glucose levels drop as she is unable to eat consistently. She is on an insulin pump. She has been taking  Promethazine twice daily 1 hour before eating for the past week with improvement. She used Regan in the past which she stated was ineffective. She reported her last HgA1C was 8.7%.She last vomited 3 days ago. No further vomiting since then but she continues to have fairly constant nausea. She has intermittent upper abdominal pain. No severe abdominal pain.  Intermittent heartburn. No dysphagia. She takes Lansoprazole 30mg  in the am and Famotidine 20mg  at bed time. She is passing a normal formed brown stool daily. She had 2 episodes of non-bloody diarrhea on 1/22 which resolved. No rectal bleeding or black stools. She's never had a colonoscopy. She was previously on Estrogen po for menopausal related sweats. She stopped the estrogen 4 weeks ago but her sweats recurred so she restarted the estrogen 2 days ago. No weight loss. Weight stable at 153lbs.  No fever or chills. No family history of gastric or colorectal cancer. Mother died at the age of 69 secondary to lung/liver cancer.    EGD 11/14/2012 by Dr. Henrene Pastor: -25mm esophageal nodule at 20cm c/w benign squamous papilloma -Distal esophageal ring  EGD  03/01/2009: -stricture distal esophagus, s/p dilation 33F -otherwise normal examination s/p CLO bx   Past Medical History:  Diagnosis Date  . Anxiety   . Asthma   . Depression   . Diabetes mellitus ([place])   . Esophageal stricture   . Gastroparesis   . GERD (gastroesophageal reflux disease)   . Glaucoma   . Graves disease 02/2010   diagnosed by radioactive iodine uptake and scan   . Helicobacter pylori infection   . Hyperlipidemia   . Hypertension   . Hypothyroidism   . Insulin pump in place   . Nephrolithiasis 2010   rt sided per GI  . Neuropathy    diabetic neuropathy  . Osteoporosis   . Sleep apnea with use of continuous positive airway pressure (CPAP)   . Vitamin D deficiency    Past Surgical History:  Procedure Laterality Date  . ABDOMINAL AORTOGRAM W/LOWER EXTREMITY Bilateral 01/01/2019   Procedure: ABDOMINAL AORTOGRAM W/LOWER EXTREMITY;  Surgeon: Lorretta Harp, MD;  Location: [facility];  Service: Cardiovascular;  Laterality: Bilateral;  . BREAST EXCISIONAL BIOPSY    . BREAST LUMPECTOMY Left    LEFT-BENIGN  . NASAL SEPTOPLASTY W/ TURBINOPLASTY Bilateral 09/07/2013   Procedure: SEPTOPLASTY, BILATERAL TURBINATE RESECTION ;  Surgeon: Ascencion Dike, MD;  Location: [facility];  Service: ENT;  Laterality: Bilateral;  . PERIPHERAL VASCULAR INTERVENTION Bilateral 01/01/2019   Procedure: PERIPHERAL VASCULAR INTERVENTION;  Surgeon: Lorretta Harp, MD;  Location: [facility];  Service: Cardiovascular;  Laterality: Bilateral;  . UPPER GASTROINTESTINAL ENDOSCOPY    . VAGINAL HYSTERECTOMY  2006  reports that she has been smoking cigarettes. She has a 12.50 pack-year smoking history. She has never used smokeless tobacco. She reports that she does not drink alcohol or use drugs. family history includes Alcoholism in her father; Diabetes in her father; Heart attack in her father; Irritable bowel syndrome in her sister; Liver cancer in her mother; Lung  cancer in her mother. Allergies  Allergen Reactions  . Clindamycin Hives  . Other Hives    Tylox   . Sulfonamide Derivatives Hives  . Lipitor [Atorvastatin] Other (See Comments)    MUSCLE ACHES  . Restasis [Cyclosporine]     Pt stated, "My eyes turned red on the inside and outside of eye; burning sensation"  . Topamax [Topiramate] Itching      Outpatient Encounter Medications as of 07/08/2019  Medication Sig  . ACCU-CHEK COMPACT PLUS test strip 1 each by Other route as needed.   Marland Kitchen ACCU-CHEK SOFTCLIX LANCETS lancets   . albuterol (PROVENTIL) (2.5 MG/3ML) 0.083% nebulizer solution Take 2.5 mg by nebulization every 4 (four) hours as needed for wheezing or shortness of breath.   . ALPRAZolam (XANAX) 0.5 MG tablet Take 0.5 mg by mouth 3 (three) times daily as needed for anxiety.   . AMBULATORY NON FORMULARY MEDICATION NOVOLOG INSULIN PUMP USES AS DIRECTED  . amLODipine (NORVASC) 5 MG tablet Take 1 tablet (5 mg total) by mouth daily.  Marland Kitchen aspirin EC 81 MG EC tablet Take 1 tablet (81 mg total) by mouth daily.  . Azelastine HCl 0.15 % SOLN Place 1 spray into the nose daily.  . busPIRone (BUSPAR) 15 MG tablet Take 15 mg by mouth 2 (two) times daily.  . clopidogrel (PLAVIX) 75 MG tablet Take 1 tablet (75 mg total) by mouth daily with breakfast.  . cyanocobalamin (,VITAMIN B-12,) 1000 MCG/ML injection Inject 1,000 mcg into the muscle every 30 (thirty) days.   . DULoxetine (CYMBALTA) 30 MG capsule Take 30 mg by mouth every morning. Takes 90 mg total  . DULoxetine (CYMBALTA) 60 MG capsule Take 60 mg by mouth every morning. Takes 90 mg total  . famotidine (PEPCID) 20 MG tablet Take 20 mg by mouth at bedtime.  . fluticasone (FLONASE) 50 MCG/ACT nasal spray Place 1 spray into the nose daily.   . folic acid (FOLVITE) 1 MG tablet Take 1 tablet by mouth daily.  . lansoprazole (PREVACID) 30 MG capsule Take 30 mg by mouth daily.  Marland Kitchen latanoprost (XALATAN) 0.005 % ophthalmic solution Place 1 drop into both  eyes at bedtime.  Marland Kitchen levocetirizine (XYZAL) 5 MG tablet Take 5 mg by mouth every morning.   Marland Kitchen levothyroxine (SYNTHROID, LEVOTHROID) 112 MCG tablet Take 112 mcg by mouth daily before breakfast.   . losartan (COZAAR) 100 MG tablet Take 100 mg by mouth at bedtime.   . methotrexate (RHEUMATREX) 2.5 MG tablet 5 tablets by moth twice a day once a week  . metoprolol succinate (TOPROL-XL) 50 MG 24 hr tablet Take 50 mg by mouth daily. One tablet by mouth once daily   . montelukast (SINGULAIR) 10 MG tablet Take 10 mg by mouth at bedtime.  Marland Kitchen NOVOLOG 100 UNIT/ML injection Inject into the skin See admin instructions. Pt has Insulin pump  . pregabalin (LYRICA) 75 MG capsule Take 150 mg by mouth at bedtime.   Marland Kitchen PROAIR HFA 108 (90 BASE) MCG/ACT inhaler Inhale 2 puffs into the lungs every 4 (four) hours as needed for wheezing or shortness of breath.   . promethazine (PHENERGAN) 25 MG tablet  Take 25 mg by mouth every 4 (four) hours as needed for nausea or vomiting. Every 4-6 hours as needed  . rosuvastatin (CRESTOR) 20 MG tablet Take 20 mg by mouth at bedtime.  . Vitamin D, Ergocalciferol, (DRISDOL) 50000 UNITS CAPS Take 50,000 Units by mouth every 7 (seven) days.  . [DISCONTINUED] hydrochlorothiazide (MICROZIDE) 12.5 MG capsule Take 1 capsule (12.5 mg total) by mouth daily.  . [DISCONTINUED] HYDROcodone-acetaminophen (NORCO) 10-325 MG tablet Take 1 tablet by mouth every 6 (six) hours as needed for moderate pain.  . [DISCONTINUED] methocarbamol (ROBAXIN) 750 MG tablet Take 750 mg by mouth every 8 (eight) hours as needed for muscle spasms.    No facility-administered encounter medications on file as of 07/08/2019.    Review of Systems: Gen: + fatgue. Denies fever, sweats or chills. No weight loss.  CV: Denies chest pain, palpitations or edema. Resp: Denies cough, shortness of breath of hemoptysis.  GI: See HPI.  GU : Denies urinary burning, blood in urine, increased urinary frequency or incontinence. MS: Denies  joint pain, muscles aches or weakness. Derm: Denies rash, itchiness, skin lesions or unhealing ulcers. Psych: + anxiety.  Heme: Denies bruising, bleeding. Neuro:  +headaches, no dizziness or paresthesias. Endo:  DM on insulin pump, increased thirst.   PHYSICAL EXAM: BP 122/74 (BP Location: Left Arm, Patient Position: Sitting, Cuff Size: Normal)   Pulse 76   Temp 98 F (36.7 C)   Ht 4\' 11"  (1.499 m) Comment: height measured without shoes  Wt 153 lb 6 oz (69.6 kg)   BMI 30.98 kg/m  General: Well developed 54 year old female in no acute distress. Head: Normocephalic and atraumatic. Eyes:  Sclerae non-icteric, conjunctive pink. Ears: Normal auditory acuity. Mouth: Few missing dentition. No ulcers or lesions.  Neck: Supple, no lymphadenopathy or thyromegaly.  Lungs: Clear bilaterally to auscultation without wheezes, crackles or rhonchi. Heart: Regular rate and rhythm. No murmur, rub or gallop appreciated.  Abdomen: Soft, nontender, non distended. No masses. No hepatosplenomegaly. Normoactive bowel sounds x 4 quadrants. Insulin pump in use.  Rectal: Deferred.  Musculoskeletal: Symmetrical with no gross deformities. Skin: Warm and dry. No rash or lesions on visible extremities. Extremities: Trace edema to ankles bilaterally.  Neurological: Alert oriented x 4, no focal deficits.  Psychological:  Alert and cooperative. Normal mood and affect.  ASSESSMENT AND PLAN:  60. 53 year old female with DM on an insulin pump, history of reflux, distal esophageal stricture and gastroparesis presents with N/V and upper abdominal pain -Abdominal sonogram  -Ondansetron 4mg  ODT 1 Q 6 to 8 hrs PRN -Ok to continue Promethazine bid as PRN -EGD benefits and risks discussed including risk with sedation, risk of bleeding, infection and perforation -Increase Famotidine 40mg  at bed time. Continue Lansoprazole 30mg  Q am.  -Our office will contact Dr. Gwenlyn Found to verify Plavix instructions prior to EGD/colonoscopy   -Our office will contact patient's endocrinologist to verify insulin pump instructions prior to EGD/colonoscopy  -Consider trial of Domperidone for gastroparesis if EGD negative  -Patient to call our office if her symptoms worsen -CBC, CMP, CRP and Lipase  -Avoid fatty foods  2. Colon cancer screening -Colonoscopy benefits and risks discussed including risk with sedation, risk of bleeding, infection and perforation  3. Peripheral arterial disease, a high-grade distal abdominal aortic stenosis proximal to the iliac bifurcation. S/P bilateral iliac stent by Dr. Gwenlyn Found 01/01/2019 on Plavix and ASA  Further follow up to be determined after the above evaluation completed   CC:  Avva, Ravisankar,  MD

## 2019-07-09 NOTE — Progress Notes (Signed)
Assessment and plan this medically complex patient reviewed

## 2019-07-11 DIAGNOSIS — G4733 Obstructive sleep apnea (adult) (pediatric): Secondary | ICD-10-CM | POA: Diagnosis not present

## 2019-07-15 ENCOUNTER — Ambulatory Visit (HOSPITAL_COMMUNITY)
Admission: RE | Admit: 2019-07-15 | Discharge: 2019-07-15 | Disposition: A | Discharge status: Home / Self Care | Payer: Medicare HMO | Source: Ambulatory Visit | Attending: Nurse Practitioner | Admitting: Nurse Practitioner

## 2019-07-15 ENCOUNTER — Other Ambulatory Visit: Payer: Self-pay

## 2019-07-15 DIAGNOSIS — R112 Nausea with vomiting, unspecified: Secondary | ICD-10-CM | POA: Insufficient documentation

## 2019-07-15 DIAGNOSIS — K219 Gastro-esophageal reflux disease without esophagitis: Secondary | ICD-10-CM

## 2019-07-17 ENCOUNTER — Encounter: Payer: Self-pay | Admitting: General Surgery

## 2019-07-17 ENCOUNTER — Telehealth: Payer: Self-pay | Admitting: General Surgery

## 2019-07-17 ENCOUNTER — Telehealth: Payer: Self-pay | Admitting: Nurse Practitioner

## 2019-07-17 NOTE — Telephone Encounter (Signed)
[facility] Pre-operative Risk Assessment     Request for surgical clearance:     Endoscopy Procedure  What type of surgery is being performed?     Colonoscopy  When is this surgery scheduled?     07/23/2019  What type of clearance is required ?   Pharmacy  Are there any medications that need to be held prior to surgery and how long? 2 days plavix  Practice name and name of physician performing surgery?      [facility]  What is your office phone and fax number?      Phone- [phone]  [phone]  Anesthesia type (None, local, MAC, general) ?       MAC

## 2019-07-17 NOTE — Telephone Encounter (Signed)
Pt is scheduled for a procedure next Thursday and wants to know when she needs to stop taking plavix. Pls call her.

## 2019-07-17 NOTE — Telephone Encounter (Signed)
Ashley Burow, MD 04/24/2019 ASSESSMENT AND PLAN:   Essential hypertension History essential hypertension blood pressure measured today 120/72.  She is on hydrochlorothiazide, losartan and metoprolol.  Hyperlipidemia History of hyperlipidemia on statin therapy with lipid profile performed 02/26/2019 revealing total cholesterol of 163, LDL 78 and HDL 35.  Peripheral arterial disease (HCC) History of peripheral arterial disease status post bilateral iliac covered stenting by myself 01/01/2019 with VBX stents for severe aortoiliac disease.  This was done because of back and leg pain.  Her symptoms completely resolved and her Dopplers normalized after that.  She currently denies claudication.  Will route to Dr Gwenlyn Found to address holding Plavix x 2 days prior to Colonoscopy.   Rosaria Ferries, PA-C 07/17/2019 5:23 PM

## 2019-07-17 NOTE — Telephone Encounter (Signed)
Per Kenna Gilbert call Gretta Cool at [facility] and leave a message with her nurse [place] regarding her insullin pump for colonoscopy on 07/23/2019.   Left voice mail for [place] to call me in reference to the patients pump.

## 2019-07-18 NOTE — Telephone Encounter (Signed)
OK to hold anti platelet Rx for GI procedure 

## 2019-07-20 ENCOUNTER — Encounter: Payer: Self-pay | Admitting: General Surgery

## 2019-07-20 NOTE — Patient Instructions (Signed)
We have received notification from [place], [facility] regarding your insulin pump and how to proceed with your insulin dosage during your Colonoscopy preparation.  This is Ashley [name] treatment plan verbatim from her fax: MD FOLLOW UP DETAILS: When she starts all liquid diet run nasal of  80% (-20%) until 3 hours after procedure. Do not bolus unless BG >200. Treat hypoglycemia with Sprite.  If you have any questions please contact our office at [phone]

## 2019-07-20 NOTE — Telephone Encounter (Signed)
   Primary Cardiologist: Quay Burow, MD  Chart reviewed as part of pre-operative protocol coverage. Patient is followed by Dr. Gwenlyn Found for PAD. She was last seen by Dr. Gwenlyn Found on 04/24/2019 at which time she was doing well.   Per Dr. Gwenlyn Found, [place] to hold Plavix as requested prior to colonoscopy. Should be restarted as soon as able following procedure.   I will route this recommendation to the requesting party via Epic fax function and remove from pre-op pool.  Please call with questions.  Darreld Mclean, PA-C 07/20/2019, 10:00 AM

## 2019-07-20 NOTE — Telephone Encounter (Signed)
Notified the patient to hold her Plavix 2 days prior to her Colonoscopy. The patient verbalized understanding.

## 2019-07-20 NOTE — Progress Notes (Signed)
Patient did not get the instuctions through my chart so they were emailed to her at [name]. The patient received them and read back instructions for inuslin pump.

## 2019-07-20 NOTE — Telephone Encounter (Signed)
Received a faxed note from [place] regarding instructions for the patients insullin pump. Contacted the patient and advise her that I would send the Note to her via My chart so that she is able to reference what [place] established for her pump. Patient verbalized understanding and will send a note back when she has read it.

## 2019-07-21 ENCOUNTER — Other Ambulatory Visit: Payer: Self-pay | Admitting: Internal Medicine

## 2019-07-21 ENCOUNTER — Ambulatory Visit (INDEPENDENT_AMBULATORY_CARE_PROVIDER_SITE_OTHER): Payer: Medicare HMO

## 2019-07-21 DIAGNOSIS — Z1159 Encounter for screening for other viral diseases: Secondary | ICD-10-CM

## 2019-07-21 LAB — SARS CORONAVIRUS 2 (TAT 6-24 HRS): SARS Coronavirus 2: NEGATIVE

## 2019-07-23 ENCOUNTER — Other Ambulatory Visit: Payer: Self-pay

## 2019-07-23 ENCOUNTER — Ambulatory Visit (AMBULATORY_SURGERY_CENTER): Payer: Medicare HMO | Admitting: Internal Medicine

## 2019-07-23 ENCOUNTER — Encounter: Payer: Self-pay | Admitting: Internal Medicine

## 2019-07-23 VITALS — BP 124/61 | HR 71 | Temp 97.3°F | Resp 17 | Ht 59.0 in | Wt 153.0 lb

## 2019-07-23 DIAGNOSIS — Z1211 Encounter for screening for malignant neoplasm of colon: Secondary | ICD-10-CM

## 2019-07-23 DIAGNOSIS — J45909 Unspecified asthma, uncomplicated: Secondary | ICD-10-CM | POA: Diagnosis not present

## 2019-07-23 DIAGNOSIS — D120 Benign neoplasm of cecum: Secondary | ICD-10-CM | POA: Diagnosis not present

## 2019-07-23 DIAGNOSIS — D122 Benign neoplasm of ascending colon: Secondary | ICD-10-CM

## 2019-07-23 DIAGNOSIS — E119 Type 2 diabetes mellitus without complications: Secondary | ICD-10-CM | POA: Diagnosis not present

## 2019-07-23 DIAGNOSIS — K219 Gastro-esophageal reflux disease without esophagitis: Secondary | ICD-10-CM

## 2019-07-23 DIAGNOSIS — D123 Benign neoplasm of transverse colon: Secondary | ICD-10-CM

## 2019-07-23 DIAGNOSIS — R112 Nausea with vomiting, unspecified: Secondary | ICD-10-CM

## 2019-07-23 DIAGNOSIS — G4733 Obstructive sleep apnea (adult) (pediatric): Secondary | ICD-10-CM | POA: Diagnosis not present

## 2019-07-23 DIAGNOSIS — I10 Essential (primary) hypertension: Secondary | ICD-10-CM | POA: Diagnosis not present

## 2019-07-23 DIAGNOSIS — I739 Peripheral vascular disease, unspecified: Secondary | ICD-10-CM | POA: Diagnosis not present

## 2019-07-23 MED ORDER — SODIUM CHLORIDE 0.9 % IV SOLN: 500.0000 mL | Freq: Once | INTRAVENOUS | Status: DC

## 2019-07-23 NOTE — Op Note (Signed)
[name] Patient Name: Ashley [name] Procedure Date: 07/23/2019 8:19 AM MRN: [id number] Endoscopist: Docia Chuck. Ashley [name] , MD Age: 54 Referring MD:  Date of Birth: [birth year] Gender: Female Account #: [id number] Procedure:                Upper GI endoscopy Indications:              Nausea with vomiting Medicines:                Monitored Anesthesia Care Procedure:                Pre-Anesthesia Assessment:                           - Prior to the procedure, a History and Physical                            was performed, and patient medications and                            allergies were reviewed. The patient's tolerance of                            previous anesthesia was also reviewed. The risks                            and benefits of the procedure and the sedation                            options and risks were discussed with the patient.                            All questions were answered, and informed consent                            was obtained. Prior Anticoagulants: The patient has                            taken Plavix (clopidogrel), last dose was 3 days                            prior to procedure. ASA Grade Assessment: III - A                            patient with severe systemic disease. After                            reviewing the risks and benefits, the patient was                            deemed in satisfactory condition to undergo the                            procedure.  After obtaining informed consent, the endoscope was                            passed under direct vision. Throughout the                            procedure, the patient's blood pressure, pulse, and                            oxygen saturations were monitored continuously. The                            Endoscope was introduced through the mouth, and                            advanced to the second part of duodenum. The upper           GI endoscopy was accomplished without difficulty.                            The patient tolerated the procedure well. Scope In: Scope Out: Findings:                 The esophagus was normal.                           The stomach was normal.                           The examined duodenum was normal.                           The cardia and gastric fundus were normal on                            retroflexion. Complications:            No immediate complications. Estimated Blood Loss:     Estimated blood loss: none. Impression:               1. Normal EGD                           2. Nausea and vomiting. Recommendation:           - Patient has a contact number available for                            emergencies. The signs and symptoms of potential                            delayed complications were discussed with the                            patient. Return to normal activities tomorrow.                            Written discharge instructions were provided to  the                            patient.                           - Eat smaller portions more frequently.                           - Continue present medications.                           - Resume Plavix (clopidogrel) at prior dose today.                           -Good blood sugar control important                           -Exercise and weight loss                           -Schedule solid-phase gastric emptying scan "rule                            out gastroparesis" [name] N. Ashley Pastor, MD 07/23/2019 9:19:12 AM This report has been signed electronically.

## 2019-07-23 NOTE — Progress Notes (Signed)
Called to room to assist during endoscopic procedure.  Patient ID and intended procedure confirmed with present staff. Received instructions for my participation in the procedure from the performing physician.  

## 2019-07-23 NOTE — Patient Instructions (Addendum)
Resume Plavix (clopidogrel) today at prior dose.   Eat smaller portions more frequently. Good blood sugar control is important. Exercise and weight loss is encouraged.  My office will schedule you for a solid-phase gastric emptying scan to "rule out gastroparesis."  Handouts provided on polyps and and hemorrhoids.   YOU HAD AN ENDOSCOPIC PROCEDURE TODAY AT [facility]:   Refer to the procedure report that was given to you for any specific questions about what was found during the examination.  If the procedure report does not answer your questions, please call your gastroenterologist to clarify.  If you requested that your care partner not be given the details of your procedure findings, then the procedure report has been included in a sealed envelope for you to review at your convenience later.  YOU SHOULD EXPECT: Some feelings of bloating in the abdomen. Passage of more gas than usual.  Walking can help get rid of the air that was put into your GI tract during the procedure and reduce the bloating. If you had a lower endoscopy (such as a colonoscopy or flexible sigmoidoscopy) you may notice spotting of blood in your stool or on the toilet paper. If you underwent a bowel prep for your procedure, you may not have a normal bowel movement for a few days.  Please Note:  You might notice some irritation and congestion in your nose or some drainage.  This is from the oxygen used during your procedure.  There is no need for concern and it should clear up in a day or so.  SYMPTOMS TO REPORT IMMEDIATELY:   Following lower endoscopy (colonoscopy or flexible sigmoidoscopy):  Excessive amounts of blood in the stool  Significant tenderness or worsening of abdominal pains  Swelling of the abdomen that is new, acute  Fever of 100F or higher   Following upper endoscopy (EGD)  Vomiting of blood or coffee ground material  New chest pain or pain under the shoulder blades  Painful or  persistently difficult swallowing  New shortness of breath  Fever of 100F or higher  Black, tarry-looking stools  For urgent or emergent issues, a gastroenterologist can be reached at any hour by calling [phone].   DIET:  We do recommend a small meal at first, but then you may proceed to your regular diet.  Drink plenty of fluids but you should avoid alcoholic beverages for 24 hours.  ACTIVITY:  You should plan to take it easy for the rest of today and you should NOT DRIVE or use heavy machinery until tomorrow (because of the sedation medicines used during the test).    FOLLOW UP: Our staff will call the number listed on your records 48-72 hours following your procedure to check on you and address any questions or concerns that you may have regarding the information given to you following your procedure. If we do not reach you, we will leave a message.  We will attempt to reach you two times.  During this call, we will ask if you have developed any symptoms of COVID 19. If you develop any symptoms (ie: fever, flu-like symptoms, shortness of breath, cough etc.) before then, please call [phone].  If you test positive for Covid 19 in the 2 weeks post procedure, please call and report this information to [place].    If any biopsies were taken you will be contacted by phone or by letter within the next 1-3 weeks.  Please call us at [phone] if you  have not heard about the biopsies in 3 weeks.    SIGNATURES/CONFIDENTIALITY: You and/or your care partner have signed paperwork which will be entered into your electronic medical record.  These signatures attest to the fact that that the information above on your After Visit Summary has been reviewed and is understood.  Full responsibility of the confidentiality of this discharge information lies with you and/or your care-partner.

## 2019-07-23 NOTE — Progress Notes (Signed)
Patient's CBG on arrival to recovery was 73. Patient wearing insulin pump during procedure. Patient alert and suspended insulin pump at that time. Patient remained in recovery asymptomatic. RN rechecked CBG prior to discharge and it was 52. Patient given 6 oz of orange juice, insulin pump still suspended. Rehecked CBG after 15 minutes and it was 46. Patient remained asymptomatic and insulin pump remained suspended. Patient given another 6oz of orange juice with graham crackers and peanut butter. Rechecked CBG after 15 minutes and it was 91. Patient reported feeling well at time of discharge and insulin pump was still suspended. RN instructed patient and her care partner that the patient needed to eat a meal as soon as possible after leaving. They both verbalized understanding.

## 2019-07-23 NOTE — Op Note (Signed)
[facility] Patient Name: Ashley [name] Procedure Date: 07/23/2019 8:20 AM MRN: [id number] Endoscopist: Docia Chuck. Henrene Pastor , MD Age: 54 Referring MD:  Date of Birth: [birth year] Gender: Female Account #: [id number] Procedure:                Colonoscopy with cold snare polypectomy x 5 Indications:              Screening for colorectal malignant neoplasm Medicines:                Monitored Anesthesia Care Procedure:                Pre-Anesthesia Assessment:                           - Prior to the procedure, a History and Physical                            was performed, and patient medications and                            allergies were reviewed. The patient's tolerance of                            previous anesthesia was also reviewed. The risks                            and benefits of the procedure and the sedation                            options and risks were discussed with the patient.                            All questions were answered, and informed consent                            was obtained. Prior Anticoagulants: The patient has                            taken Plavix (clopidogrel), last dose was 3 days                            prior to procedure. ASA Grade Assessment: III - A                            patient with severe systemic disease. After                            reviewing the risks and benefits, the patient was                            deemed in satisfactory condition to undergo the                            procedure.  After obtaining informed consent, the colonoscope                            was passed under direct vision. Throughout the                            procedure, the patient's blood pressure, pulse, and                            oxygen saturations were monitored continuously. The                            Colonoscope was introduced through the anus and                            advanced to the the  cecum, identified by                            appendiceal orifice and ileocecal valve. The                            ileocecal valve, appendiceal orifice, and rectum                            were photographed. The quality of the bowel                            preparation was excellent. The colonoscopy was                            performed without difficulty. The patient tolerated                            the procedure well. The bowel preparation used was                            SUPREP via split dose instruction. Scope In: 8:37:12 AM Scope Out: 8:54:33 AM Scope Withdrawal Time: 0 hours 13 minutes 41 seconds  Total Procedure Duration: 0 hours 17 minutes 21 seconds  Findings:                 Five polyps were found in the transverse colon,                            ascending colon and cecum. The polyps were 1 to 8                            mm in size. These polyps were removed with a cold                            snare. Resection and retrieval were complete.                           External and internal hemorrhoids were  found during                            retroflexion.                           The exam was otherwise without abnormality on                            direct and retroflexion views. Complications:            No immediate complications. Estimated blood loss:                            None. Estimated Blood Loss:     Estimated blood loss: none. Impression:               - Five 1 to 8 mm polyps in the transverse colon, in                            the ascending colon and in the cecum, removed with                            a cold snare. Resected and retrieved.                           - External and internal hemorrhoids.                           - The examination was otherwise normal on direct                            and retroflexion views. Recommendation:           - Repeat colonoscopy in 3 years for surveillance.                           -  Resume Plavix (clopidogrel) today at prior dose.                           - Patient has a contact number available for                            emergencies. The signs and symptoms of potential                            delayed complications were discussed with the                            patient. Return to normal activities tomorrow.                            Written discharge instructions were provided to the                            patient.                           -  Resume previous diet.                           - Continue present medications.                           - Await pathology results. Docia Chuck. Henrene Pastor, MD 07/23/2019 9:15:55 AM This report has been signed electronically.

## 2019-07-23 NOTE — Progress Notes (Signed)
Report to PACU, RN, vss, BBS= Clear.  

## 2019-07-23 NOTE — Progress Notes (Signed)
Temp JB Vitals by DT

## 2019-07-24 ENCOUNTER — Other Ambulatory Visit: Payer: Self-pay

## 2019-07-24 ENCOUNTER — Telehealth: Payer: Self-pay

## 2019-07-24 DIAGNOSIS — R112 Nausea with vomiting, unspecified: Secondary | ICD-10-CM

## 2019-07-24 NOTE — Telephone Encounter (Signed)
Pt scheduled for GES at [facility] 08/06/19 at 11am, pt to arrive there at 10:45am. Pt to be NPO after 7am and hold stomach meds after midnight. Pt aware of appt but has another appt. Pt given the phone number [phone] to reschedule the appointment to a date that works better for her.

## 2019-07-27 ENCOUNTER — Telehealth: Payer: Self-pay

## 2019-07-27 ENCOUNTER — Encounter: Payer: Self-pay | Admitting: Internal Medicine

## 2019-07-27 NOTE — Telephone Encounter (Signed)
  Follow up Call-  Call back number 07/23/2019  Post procedure Call Back phone  # [phone]  Permission to leave phone message Yes  Some recent data might be hidden     Patient questions:  Do you have a fever, pain , or abdominal swelling? No. Pain Score  0 *  Have you tolerated food without any problems? Yes.    Have you been able to return to your normal activities? Yes.    Do you have any questions about your discharge instructions: Diet   No. Medications  No. Follow up visit  No.  Do you have questions or concerns about your Care? No.  Actions: * If pain score is 4 or above: No action needed, pain <4.  1. Have you developed a fever since your procedure? no  2.   Have you had an respiratory symptoms (SOB or cough) since your procedure? no  3.   Have you tested positive for COVID 19 since your procedure no  4.   Have you had any family members/close contacts diagnosed with the COVID 19 since your procedure?  no   If yes to any of these questions please route to Joylene John, RN and Alphonsa Gin, Therapist, sports.

## 2019-07-27 NOTE — Telephone Encounter (Signed)
1st follow up call made.  NALM 

## 2019-08-03 DIAGNOSIS — E109 Type 1 diabetes mellitus without complications: Secondary | ICD-10-CM | POA: Diagnosis not present

## 2019-08-06 ENCOUNTER — Ambulatory Visit (HOSPITAL_COMMUNITY): Payer: Medicare HMO

## 2019-08-12 ENCOUNTER — Ambulatory Visit (HOSPITAL_COMMUNITY)
Admission: RE | Admit: 2019-08-12 | Discharge: 2019-08-12 | Disposition: A | Discharge status: Home / Self Care | Payer: Medicare HMO | Source: Ambulatory Visit | Attending: Internal Medicine | Admitting: Internal Medicine

## 2019-08-12 ENCOUNTER — Other Ambulatory Visit: Payer: Self-pay

## 2019-08-12 DIAGNOSIS — K3184 Gastroparesis: Secondary | ICD-10-CM | POA: Diagnosis not present

## 2019-08-12 DIAGNOSIS — R112 Nausea with vomiting, unspecified: Secondary | ICD-10-CM | POA: Diagnosis not present

## 2019-08-12 MED ORDER — TECHNETIUM TC 99M SULFUR COLLOID
2.0000 | Freq: Once | INTRAVENOUS | Status: AC | PRN
  Administered 2019-08-12: 2 via INTRAVENOUS

## 2019-08-14 ENCOUNTER — Other Ambulatory Visit: Payer: Self-pay

## 2019-08-14 MED ORDER — METOCLOPRAMIDE HCL 10 MG PO TABS: ORAL_TABLET | ORAL | 2 refills | Status: DC

## 2019-08-17 ENCOUNTER — Telehealth: Payer: Self-pay | Admitting: Internal Medicine

## 2019-08-17 NOTE — Telephone Encounter (Signed)
Patient is returning your call.  

## 2019-08-27 ENCOUNTER — Telehealth: Payer: Self-pay | Admitting: Nurse Practitioner

## 2019-08-27 NOTE — Telephone Encounter (Signed)
Please advise if it is okay to refill her Ondansetron and increase the famotadine from 20 to 40mg  per patient request

## 2019-08-28 MED ORDER — FAMOTIDINE 40 MG PO TABS: 40.0000 mg | ORAL_TABLET | Freq: Every day | ORAL | 1 refills | Status: DC

## 2019-08-28 MED ORDER — ONDANSETRON HCL 8 MG PO TABS: ORAL_TABLET | ORAL | 1 refills | Status: DC

## 2019-08-28 NOTE — Telephone Encounter (Signed)
Ok to refill Ondansetron 8mg  odt 1 po Q 6 to 8hrs PRN N/V # 30, 1 refil. Famotidine 40mg  1 po Q HS. # 90, 1 RF thx

## 2019-09-01 DIAGNOSIS — Z961 Presence of intraocular lens: Secondary | ICD-10-CM | POA: Diagnosis not present

## 2019-09-01 DIAGNOSIS — E109 Type 1 diabetes mellitus without complications: Secondary | ICD-10-CM | POA: Diagnosis not present

## 2019-09-01 DIAGNOSIS — H30033 Focal chorioretinal inflammation, peripheral, bilateral: Secondary | ICD-10-CM | POA: Diagnosis not present

## 2019-09-01 DIAGNOSIS — Z79899 Other long term (current) drug therapy: Secondary | ICD-10-CM | POA: Diagnosis not present

## 2019-09-01 DIAGNOSIS — H3581 Retinal edema: Secondary | ICD-10-CM | POA: Diagnosis not present

## 2019-09-02 NOTE — Progress Notes (Signed)
error 

## 2019-09-03 DIAGNOSIS — Z4681 Encounter for fitting and adjustment of insulin pump: Secondary | ICD-10-CM | POA: Diagnosis not present

## 2019-09-03 DIAGNOSIS — I10 Essential (primary) hypertension: Secondary | ICD-10-CM | POA: Diagnosis not present

## 2019-09-03 DIAGNOSIS — F418 Other specified anxiety disorders: Secondary | ICD-10-CM | POA: Diagnosis not present

## 2019-09-03 DIAGNOSIS — Z794 Long term (current) use of insulin: Secondary | ICD-10-CM | POA: Diagnosis not present

## 2019-09-03 DIAGNOSIS — E1040 Type 1 diabetes mellitus with diabetic neuropathy, unspecified: Secondary | ICD-10-CM | POA: Diagnosis not present

## 2019-09-04 DIAGNOSIS — E1040 Type 1 diabetes mellitus with diabetic neuropathy, unspecified: Secondary | ICD-10-CM | POA: Diagnosis not present

## 2019-09-04 DIAGNOSIS — I10 Essential (primary) hypertension: Secondary | ICD-10-CM | POA: Diagnosis not present

## 2019-09-04 DIAGNOSIS — K3184 Gastroparesis: Secondary | ICD-10-CM | POA: Diagnosis not present

## 2019-09-04 DIAGNOSIS — Z0289 Encounter for other administrative examinations: Secondary | ICD-10-CM | POA: Diagnosis not present

## 2019-09-04 DIAGNOSIS — F418 Other specified anxiety disorders: Secondary | ICD-10-CM | POA: Diagnosis not present

## 2019-09-23 ENCOUNTER — Encounter: Payer: Self-pay | Admitting: Internal Medicine

## 2019-09-23 ENCOUNTER — Ambulatory Visit: Payer: Medicare HMO | Admitting: Internal Medicine

## 2019-09-23 VITALS — BP 90/60 | HR 92 | Temp 98.5°F | Ht 59.0 in | Wt 154.0 lb

## 2019-09-23 DIAGNOSIS — K3184 Gastroparesis: Secondary | ICD-10-CM | POA: Diagnosis not present

## 2019-09-23 DIAGNOSIS — R110 Nausea: Secondary | ICD-10-CM | POA: Diagnosis not present

## 2019-09-23 DIAGNOSIS — K219 Gastro-esophageal reflux disease without esophagitis: Secondary | ICD-10-CM | POA: Diagnosis not present

## 2019-09-23 DIAGNOSIS — R21 Rash and other nonspecific skin eruption: Secondary | ICD-10-CM | POA: Diagnosis not present

## 2019-09-23 MED ORDER — LANSOPRAZOLE 30 MG PO CPDR: 30.0000 mg | DELAYED_RELEASE_CAPSULE | Freq: Two times a day (BID) | ORAL | 3 refills | Status: DC

## 2019-09-23 NOTE — Progress Notes (Signed)
HISTORY OF PRESENT ILLNESS:  Ashley [name] is a 54 y.o. female with multiple significant medical problems evaluated in this office by the GI nurse practitioner July 08, 2019 regarding nausea and vomiting.  See that dictation for details.  Abdominal ultrasound was performed July 15, 2019.  This was unremarkable.  Colonoscopy and upper endoscopy were performed July 23, 2019.  Colonoscopy revealed 5 adenomatous colon polyps and hemorrhoids.  Follow-up in 3 years recommended.  Upper endoscopy was entirely normal.  Blood work revealed normal comprehensive metabolic panel including liver test.  Unremarkable CBC with hemoglobin 12.4.  Last hemoglobin A1c 8.4.  A solid-phase gastric emptying scan was performed August 12, 2019.  This was mildly abnormal at 2 hours and 4 hours.  She was prescribed metoclopramide 10 mg to be taken 15 minutes before meals.  Also advised to eat smaller meal size.  Good diabetic control emphasized.  Follow-up at this time recommended.  Patient tells me that metoclopramide makes no difference in symptoms.  She tells me that she has not had vomiting in several weeks.  Nausea is nearly constant.  Zofran does not seem to be particularly effective.  Phenergan is effective but results in somnolence.  Her weight is up 1 pound since her last visit.  She is on multiple medications.  She takes methotrexate once per week and possibly feels that problems with nausea have worsened since initiating that particular therapy.  She is on Prevacid once daily and famotidine at night.  She has completed her vaccination series.  REVIEW OF SYSTEMS:  All non-GI ROS negative unless otherwise stated in the HPI except for allergies and new skin rash lower extremities  Past Medical History:  Diagnosis Date  . Anxiety   . Asthma   . Depression   . Diabetes mellitus ([place])   . Esophageal stricture   . Gastroparesis   . GERD (gastroesophageal reflux disease)   . Glaucoma   . Graves disease 02/2010    diagnosed by radioactive iodine uptake and scan   . Helicobacter pylori infection   . Hyperlipidemia   . Hypertension   . Hypothyroidism   . Insulin pump in place   . Nephrolithiasis 2010   rt sided per GI  . Neuropathy    diabetic neuropathy  . Osteoporosis   . Sleep apnea   . Sleep apnea with use of continuous positive airway pressure (CPAP)   . Vitamin D deficiency     Past Surgical History:  Procedure Laterality Date  . ABDOMINAL AORTOGRAM W/LOWER EXTREMITY Bilateral 01/01/2019   Procedure: ABDOMINAL AORTOGRAM W/LOWER EXTREMITY;  Surgeon: Lorretta Harp, MD;  Location: [facility];  Service: Cardiovascular;  Laterality: Bilateral;  . BREAST EXCISIONAL BIOPSY    . BREAST LUMPECTOMY Left    LEFT-BENIGN  . NASAL SEPTOPLASTY W/ TURBINOPLASTY Bilateral 09/07/2013   Procedure: SEPTOPLASTY, BILATERAL TURBINATE RESECTION ;  Surgeon: Ascencion Dike, MD;  Location: [facility];  Service: ENT;  Laterality: Bilateral;  . PERIPHERAL VASCULAR INTERVENTION Bilateral 01/01/2019   Procedure: PERIPHERAL VASCULAR INTERVENTION;  Surgeon: Lorretta Harp, MD;  Location: [facility];  Service: Cardiovascular;  Laterality: Bilateral;  . UPPER GASTROINTESTINAL ENDOSCOPY    . VAGINAL HYSTERECTOMY  2006    Social History BEZA STEPPE  reports that she has been smoking cigarettes. She has a 12.50 pack-year smoking history. She has never used smokeless tobacco. She reports that she does not drink alcohol or use drugs.  family history includes Alcoholism  in her father; Diabetes in her father; Heart attack in her father; Irritable bowel syndrome in her sister; Liver cancer in her mother; Lung cancer in her mother.  Allergies  Allergen Reactions  . Clindamycin Hives  . Other Hives    Tylox   . Sulfonamide Derivatives Hives  . Lipitor [Atorvastatin] Other (See Comments)    MUSCLE ACHES  . Restasis [Cyclosporine]     Pt stated, "My eyes turned red on the inside and  outside of eye; burning sensation"  . Topamax [Topiramate] Itching       PHYSICAL EXAMINATION: Vital signs: BP 90/60   Pulse 92   Temp 98.5 F (36.9 C)   Ht '4\' 11"'  (1.499 m)   Wt 154 lb (69.9 kg)   BMI 31.10 kg/m   Constitutional: Pleasant, unhealthy appearing, no acute distress Psychiatric: alert and oriented x3, cooperative Eyes: extraocular movements intact, anicteric, conjunctiva pink Mouth: oral pharynx moist, no lesions Neck: supple no lymphadenopathy Cardiovascular: heart regular rate and rhythm, no murmur Lungs: clear to auscultation bilaterally Abdomen: soft, nontender, nondistended, no obvious ascites, no peritoneal signs, normal bowel sounds, no organomegaly.  No succussion splash Rectal: Omitted Extremities: no clubbing, cyanosis, or lower extremity edema bilaterally Skin: Pinpoint or petechial skin rash of the lower extremities bilaterally Neuro: No focal deficits.  Cranial nerves intact  ASSESSMENT:  1.  Chronic nausea with occasional vomiting.  Her issues may be multifactorial including known GERD, mild diabetic gastroparesis, suboptimal diabetic control, and polypharmacy. 2.  Recent EGD normal 3.  Gastroparesis on gastric emptying scan 4.  Obesity 5.  Multiple colon polyps, adenomatous. 6.  Multiple medical problems  7.  New petechial rash lower extremities.  Advised to consult PCP  PLAN:  1.  Reflux precautions 2.  Stop Reglan as this is not working 3.  Increase lansoprazole to 30 mg twice daily.  Prescription revised.  Medication risks reviewed 4.  Discussed with prescribing physician the possibility of methotrexate induced gastric upset 5.  Smaller meals, weight loss 6.  Zofran as needed for nausea 7.  Surveillance colonoscopy 3 years 8.  Resume care with PCP.  Interval follow-up as needed A total time of 30 minutes was spent preparing to see the patient, reviewing a myriad of tests and studies as outlined, obtaining history, performing comprehensive  physical exam, counseling the patient regarding her above listed issues, ordering medications, and documenting information in the clinical health record

## 2019-09-23 NOTE — Patient Instructions (Signed)
We have sent the following medications to your pharmacy for you to pick up at your convenience:  Prevacid.  Discontinue Reglan  Talk to your PCP about Methotrexate

## 2019-09-28 DIAGNOSIS — F418 Other specified anxiety disorders: Secondary | ICD-10-CM | POA: Diagnosis not present

## 2019-10-02 DIAGNOSIS — Z0000 Encounter for general adult medical examination without abnormal findings: Secondary | ICD-10-CM | POA: Diagnosis not present

## 2019-10-02 DIAGNOSIS — M810 Age-related osteoporosis without current pathological fracture: Secondary | ICD-10-CM | POA: Diagnosis not present

## 2019-10-02 DIAGNOSIS — E0500 Thyrotoxicosis with diffuse goiter without thyrotoxic crisis or storm: Secondary | ICD-10-CM | POA: Diagnosis not present

## 2019-10-02 DIAGNOSIS — E1040 Type 1 diabetes mellitus with diabetic neuropathy, unspecified: Secondary | ICD-10-CM | POA: Diagnosis not present

## 2019-10-02 DIAGNOSIS — G4733 Obstructive sleep apnea (adult) (pediatric): Secondary | ICD-10-CM | POA: Diagnosis not present

## 2019-10-02 DIAGNOSIS — E7849 Other hyperlipidemia: Secondary | ICD-10-CM | POA: Diagnosis not present

## 2019-10-09 DIAGNOSIS — I10 Essential (primary) hypertension: Secondary | ICD-10-CM | POA: Diagnosis not present

## 2019-10-09 DIAGNOSIS — F17200 Nicotine dependence, unspecified, uncomplicated: Secondary | ICD-10-CM | POA: Diagnosis not present

## 2019-10-09 DIAGNOSIS — Z0000 Encounter for general adult medical examination without abnormal findings: Secondary | ICD-10-CM | POA: Diagnosis not present

## 2019-10-09 DIAGNOSIS — I739 Peripheral vascular disease, unspecified: Secondary | ICD-10-CM | POA: Diagnosis not present

## 2019-10-09 DIAGNOSIS — F418 Other specified anxiety disorders: Secondary | ICD-10-CM | POA: Diagnosis not present

## 2019-10-09 DIAGNOSIS — G4733 Obstructive sleep apnea (adult) (pediatric): Secondary | ICD-10-CM | POA: Diagnosis not present

## 2019-10-09 DIAGNOSIS — E785 Hyperlipidemia, unspecified: Secondary | ICD-10-CM | POA: Diagnosis not present

## 2019-10-09 DIAGNOSIS — K3184 Gastroparesis: Secondary | ICD-10-CM | POA: Diagnosis not present

## 2019-10-09 DIAGNOSIS — R82998 Other abnormal findings in urine: Secondary | ICD-10-CM | POA: Diagnosis not present

## 2019-10-09 DIAGNOSIS — E1040 Type 1 diabetes mellitus with diabetic neuropathy, unspecified: Secondary | ICD-10-CM | POA: Diagnosis not present

## 2019-10-15 ENCOUNTER — Other Ambulatory Visit: Payer: Self-pay | Admitting: Nurse Practitioner

## 2019-10-19 DIAGNOSIS — H30033 Focal chorioretinal inflammation, peripheral, bilateral: Secondary | ICD-10-CM | POA: Diagnosis not present

## 2019-10-19 DIAGNOSIS — Z79899 Other long term (current) drug therapy: Secondary | ICD-10-CM | POA: Diagnosis not present

## 2019-10-26 DIAGNOSIS — F418 Other specified anxiety disorders: Secondary | ICD-10-CM | POA: Diagnosis not present

## 2019-10-26 DIAGNOSIS — I10 Essential (primary) hypertension: Secondary | ICD-10-CM | POA: Diagnosis not present

## 2019-11-06 DIAGNOSIS — E109 Type 1 diabetes mellitus without complications: Secondary | ICD-10-CM | POA: Diagnosis not present

## 2019-11-10 DIAGNOSIS — H3581 Retinal edema: Secondary | ICD-10-CM | POA: Diagnosis not present

## 2019-11-10 DIAGNOSIS — Z961 Presence of intraocular lens: Secondary | ICD-10-CM | POA: Diagnosis not present

## 2019-11-10 DIAGNOSIS — Z79899 Other long term (current) drug therapy: Secondary | ICD-10-CM | POA: Diagnosis not present

## 2019-11-10 DIAGNOSIS — E109 Type 1 diabetes mellitus without complications: Secondary | ICD-10-CM | POA: Diagnosis not present

## 2019-11-10 DIAGNOSIS — H30033 Focal chorioretinal inflammation, peripheral, bilateral: Secondary | ICD-10-CM | POA: Diagnosis not present

## 2019-11-10 HISTORY — PX: CATARACT EXTRACTION W/ INTRAOCULAR LENS  IMPLANT, BILATERAL: SHX1307

## 2019-11-12 DIAGNOSIS — J45901 Unspecified asthma with (acute) exacerbation: Secondary | ICD-10-CM | POA: Diagnosis not present

## 2019-11-12 DIAGNOSIS — J45909 Unspecified asthma, uncomplicated: Secondary | ICD-10-CM | POA: Diagnosis not present

## 2019-11-12 DIAGNOSIS — J019 Acute sinusitis, unspecified: Secondary | ICD-10-CM | POA: Diagnosis not present

## 2019-11-12 DIAGNOSIS — R05 Cough: Secondary | ICD-10-CM | POA: Diagnosis not present

## 2019-11-27 ENCOUNTER — Other Ambulatory Visit: Payer: Self-pay

## 2019-11-27 MED ORDER — LANSOPRAZOLE 30 MG PO CPDR: 30.0000 mg | DELAYED_RELEASE_CAPSULE | Freq: Two times a day (BID) | ORAL | 2 refills | Status: DC

## 2019-12-04 ENCOUNTER — Other Ambulatory Visit: Payer: Self-pay

## 2019-12-04 MED ORDER — LANSOPRAZOLE 30 MG PO CPDR: 30.0000 mg | DELAYED_RELEASE_CAPSULE | Freq: Two times a day (BID) | ORAL | 2 refills | Status: DC

## 2019-12-04 NOTE — Progress Notes (Signed)
Refill sent to [facility].

## 2019-12-11 ENCOUNTER — Telehealth: Payer: Self-pay | Admitting: Cardiovascular Disease

## 2019-12-11 NOTE — Telephone Encounter (Signed)
Spoke to pt who report yesterday she noticed dark red blood in her urine and experiencing back pain. Last night it was only a faint amount and this morning urine was back clear. Pt report see is still having back pain and feels she has a UTI. Pt state she contacted PCP but he is out of town and was instructed to go to ER. Pt state she does not want to go that route and wondered if Dr. Gwenlyn Found could prescribe her an antibiotic. Nurse informed pt that MD doesn't typically prescribe medication for a UTI and advised to be seen at an Urgent care. Pt verbalized understanding.  Pt also report she occasionally experience swelling in her legs and feet after she's been up and moving throughout the day. She denies symptoms now but wanted to schedule and appointment for further evaluations. Appointment scheduled for 7/9 with Dr. Gwenlyn Found.

## 2019-12-11 NOTE — Telephone Encounter (Signed)
Patient has bleeding while urinating. She states she knows it is a UTI. She called her PCP but he is out today and they advised her to go to the ED. She does not want to do this and wants to know if Dr. Gwenlyn Found can send her in a prescription. Please advise.

## 2019-12-15 DIAGNOSIS — Z794 Long term (current) use of insulin: Secondary | ICD-10-CM | POA: Diagnosis not present

## 2019-12-15 DIAGNOSIS — Z4681 Encounter for fitting and adjustment of insulin pump: Secondary | ICD-10-CM | POA: Diagnosis not present

## 2019-12-15 DIAGNOSIS — E1040 Type 1 diabetes mellitus with diabetic neuropathy, unspecified: Secondary | ICD-10-CM | POA: Diagnosis not present

## 2019-12-15 DIAGNOSIS — N200 Calculus of kidney: Secondary | ICD-10-CM | POA: Diagnosis not present

## 2019-12-15 DIAGNOSIS — I10 Essential (primary) hypertension: Secondary | ICD-10-CM | POA: Diagnosis not present

## 2019-12-16 ENCOUNTER — Ambulatory Visit: Payer: Medicare HMO | Admitting: Family Medicine

## 2019-12-18 ENCOUNTER — Ambulatory Visit: Payer: Medicare HMO | Admitting: Cardiovascular Disease

## 2019-12-21 DIAGNOSIS — N958 Other specified menopausal and perimenopausal disorders: Secondary | ICD-10-CM | POA: Diagnosis not present

## 2019-12-21 DIAGNOSIS — Z01419 Encounter for gynecological examination (general) (routine) without abnormal findings: Secondary | ICD-10-CM | POA: Diagnosis not present

## 2019-12-21 DIAGNOSIS — M816 Localized osteoporosis [Lequesne]: Secondary | ICD-10-CM | POA: Diagnosis not present

## 2019-12-21 DIAGNOSIS — D649 Anemia, unspecified: Secondary | ICD-10-CM | POA: Diagnosis not present

## 2019-12-21 DIAGNOSIS — Z1231 Encounter for screening mammogram for malignant neoplasm of breast: Secondary | ICD-10-CM | POA: Diagnosis not present

## 2019-12-22 ENCOUNTER — Other Ambulatory Visit (HOSPITAL_COMMUNITY): Payer: Self-pay | Admitting: Cardiovascular Disease

## 2019-12-22 DIAGNOSIS — Z95828 Presence of other vascular implants and grafts: Secondary | ICD-10-CM

## 2019-12-22 DIAGNOSIS — I739 Peripheral vascular disease, unspecified: Secondary | ICD-10-CM

## 2019-12-23 ENCOUNTER — Other Ambulatory Visit: Payer: Self-pay | Admitting: Obstetrics and Gynecology

## 2019-12-23 DIAGNOSIS — R928 Other abnormal and inconclusive findings on diagnostic imaging of breast: Secondary | ICD-10-CM

## 2019-12-24 ENCOUNTER — Other Ambulatory Visit: Payer: Self-pay | Admitting: Cardiovascular Disease

## 2019-12-30 ENCOUNTER — Ambulatory Visit
Admission: RE | Admit: 2019-12-30 | Discharge: 2019-12-30 | Disposition: A | Discharge status: Home / Self Care | Payer: Medicare HMO | Source: Ambulatory Visit | Attending: Obstetrics and Gynecology | Admitting: Obstetrics and Gynecology

## 2019-12-30 ENCOUNTER — Other Ambulatory Visit: Payer: Self-pay

## 2019-12-30 ENCOUNTER — Ambulatory Visit: Admission: RE | Admit: 2019-12-30 | Payer: Medicare HMO | Source: Ambulatory Visit

## 2019-12-30 ENCOUNTER — Other Ambulatory Visit: Payer: Self-pay | Admitting: Obstetrics and Gynecology

## 2019-12-30 DIAGNOSIS — R928 Other abnormal and inconclusive findings on diagnostic imaging of breast: Secondary | ICD-10-CM

## 2019-12-30 DIAGNOSIS — R922 Inconclusive mammogram: Secondary | ICD-10-CM | POA: Diagnosis not present

## 2019-12-30 DIAGNOSIS — R921 Mammographic calcification found on diagnostic imaging of breast: Secondary | ICD-10-CM

## 2020-01-04 ENCOUNTER — Ambulatory Visit
Admission: RE | Admit: 2020-01-04 | Discharge: 2020-01-04 | Disposition: A | Discharge status: Home / Self Care | Payer: Medicare HMO | Source: Ambulatory Visit | Attending: Obstetrics and Gynecology | Admitting: Obstetrics and Gynecology

## 2020-01-04 ENCOUNTER — Other Ambulatory Visit: Payer: Self-pay

## 2020-01-04 DIAGNOSIS — N6011 Diffuse cystic mastopathy of right breast: Secondary | ICD-10-CM | POA: Diagnosis not present

## 2020-01-04 DIAGNOSIS — R921 Mammographic calcification found on diagnostic imaging of breast: Secondary | ICD-10-CM

## 2020-01-08 DIAGNOSIS — J45901 Unspecified asthma with (acute) exacerbation: Secondary | ICD-10-CM | POA: Diagnosis not present

## 2020-01-08 DIAGNOSIS — Z20818 Contact with and (suspected) exposure to other bacterial communicable diseases: Secondary | ICD-10-CM | POA: Diagnosis not present

## 2020-01-08 DIAGNOSIS — R05 Cough: Secondary | ICD-10-CM | POA: Diagnosis not present

## 2020-01-08 DIAGNOSIS — J019 Acute sinusitis, unspecified: Secondary | ICD-10-CM | POA: Diagnosis not present

## 2020-01-11 DIAGNOSIS — J069 Acute upper respiratory infection, unspecified: Secondary | ICD-10-CM | POA: Diagnosis not present

## 2020-01-11 DIAGNOSIS — J40 Bronchitis, not specified as acute or chronic: Secondary | ICD-10-CM | POA: Diagnosis not present

## 2020-01-11 DIAGNOSIS — I10 Essential (primary) hypertension: Secondary | ICD-10-CM | POA: Diagnosis not present

## 2020-01-11 DIAGNOSIS — Z1152 Encounter for screening for COVID-19: Secondary | ICD-10-CM | POA: Diagnosis not present

## 2020-01-11 DIAGNOSIS — F17200 Nicotine dependence, unspecified, uncomplicated: Secondary | ICD-10-CM | POA: Diagnosis not present

## 2020-01-11 DIAGNOSIS — R05 Cough: Secondary | ICD-10-CM | POA: Diagnosis not present

## 2020-01-27 DIAGNOSIS — Z79899 Other long term (current) drug therapy: Secondary | ICD-10-CM | POA: Diagnosis not present

## 2020-01-27 DIAGNOSIS — H30033 Focal chorioretinal inflammation, peripheral, bilateral: Secondary | ICD-10-CM | POA: Diagnosis not present

## 2020-02-02 ENCOUNTER — Ambulatory Visit (HOSPITAL_BASED_OUTPATIENT_CLINIC_OR_DEPARTMENT_OTHER)
Admission: RE | Admit: 2020-02-02 | Discharge: 2020-02-02 | Disposition: A | Discharge status: Home / Self Care | Payer: Medicare HMO | Source: Ambulatory Visit | Attending: Cardiovascular Disease | Admitting: Cardiovascular Disease

## 2020-02-02 ENCOUNTER — Ambulatory Visit (HOSPITAL_COMMUNITY)
Admission: RE | Admit: 2020-02-02 | Discharge: 2020-02-02 | Disposition: A | Discharge status: Home / Self Care | Payer: Medicare HMO | Source: Ambulatory Visit | Attending: Cardiovascular Disease | Admitting: Cardiovascular Disease

## 2020-02-02 ENCOUNTER — Other Ambulatory Visit: Payer: Self-pay

## 2020-02-02 ENCOUNTER — Encounter (HOSPITAL_COMMUNITY): Payer: Medicare HMO

## 2020-02-02 ENCOUNTER — Other Ambulatory Visit (HOSPITAL_COMMUNITY): Payer: Self-pay | Admitting: Cardiovascular Disease

## 2020-02-02 DIAGNOSIS — Z95828 Presence of other vascular implants and grafts: Secondary | ICD-10-CM | POA: Insufficient documentation

## 2020-02-02 DIAGNOSIS — I739 Peripheral vascular disease, unspecified: Secondary | ICD-10-CM

## 2020-02-04 ENCOUNTER — Telehealth: Payer: Self-pay | Admitting: Cardiovascular Disease

## 2020-02-04 NOTE — Telephone Encounter (Signed)
Spoke to patient appointment scheduled with Dr.Berry 9/15 at 4:30 pm to discuss aorta doppler results.

## 2020-02-04 NOTE — Telephone Encounter (Signed)
Patient returning call for vascular test results. 

## 2020-02-09 DIAGNOSIS — H30033 Focal chorioretinal inflammation, peripheral, bilateral: Secondary | ICD-10-CM | POA: Diagnosis not present

## 2020-02-09 DIAGNOSIS — Z79899 Other long term (current) drug therapy: Secondary | ICD-10-CM | POA: Diagnosis not present

## 2020-02-09 DIAGNOSIS — H3581 Retinal edema: Secondary | ICD-10-CM | POA: Diagnosis not present

## 2020-02-09 DIAGNOSIS — Z961 Presence of intraocular lens: Secondary | ICD-10-CM | POA: Diagnosis not present

## 2020-02-09 DIAGNOSIS — E109 Type 1 diabetes mellitus without complications: Secondary | ICD-10-CM | POA: Diagnosis not present

## 2020-02-23 ENCOUNTER — Telehealth: Payer: Self-pay | Admitting: Internal Medicine

## 2020-02-23 DIAGNOSIS — R899 Unspecified abnormal finding in specimens from other organs, systems and tissues: Secondary | ICD-10-CM | POA: Diagnosis not present

## 2020-02-23 NOTE — Telephone Encounter (Signed)
Pt is requesting a medication refill on her PEPCID 90 day supply to her mail order pharmacy [facility]

## 2020-02-24 ENCOUNTER — Encounter: Payer: Self-pay | Admitting: Cardiovascular Disease

## 2020-02-24 ENCOUNTER — Other Ambulatory Visit: Payer: Self-pay

## 2020-02-24 ENCOUNTER — Ambulatory Visit (INDEPENDENT_AMBULATORY_CARE_PROVIDER_SITE_OTHER): Payer: Medicare HMO | Admitting: Cardiovascular Disease

## 2020-02-24 DIAGNOSIS — I739 Peripheral vascular disease, unspecified: Secondary | ICD-10-CM | POA: Diagnosis not present

## 2020-02-24 DIAGNOSIS — Z720 Tobacco use: Secondary | ICD-10-CM

## 2020-02-24 DIAGNOSIS — E782 Mixed hyperlipidemia: Secondary | ICD-10-CM | POA: Diagnosis not present

## 2020-02-24 DIAGNOSIS — I10 Essential (primary) hypertension: Secondary | ICD-10-CM

## 2020-02-24 MED ORDER — FAMOTIDINE 40 MG PO TABS
40.0000 mg | ORAL_TABLET | Freq: Every day | ORAL | 3 refills | Status: DC
Start: 2020-02-24 — End: 2020-07-06

## 2020-02-24 NOTE — Assessment & Plan Note (Signed)
History of essential hypertension a blood pressure measured today 118/78.  She is on amlodipine 5 mg p.o. twice daily as well as metoprolol and losartan.  I suspect the amlodipine may be contributing to her lower extreme extremity edema and I would recommend that she cut this in half and add hydrochlorothiazide but will leave this up to her primary care physician.

## 2020-02-24 NOTE — Patient Instructions (Signed)
Medication Instructions:  No Changes In Medications at this time. *If you need a refill on your cardiac medications before your next appointment, please call your pharmacy*  Lab Work: None Ordered At This Time.  If you have labs (blood work) drawn today and your tests are completely normal, you will receive your results only by: Marland Kitchen MyChart Message (if you have MyChart) OR . A paper copy in the mail If you have any lab test that is abnormal or we need to change your treatment, we will call you to review the results.  Testing/Procedures: Your physician has requested that you have a lower extremity arterial duplex in one year. This test is an ultrasound of the arteries in the legs. It looks at arterial blood flow in the legs. Allow one hour for Lower and Upper Arterial scans. There are no restrictions or special instructions  Your physician has requested that you have an ankle brachial index (ABI). During this test an ultrasound and blood pressure cuff are used to evaluate the arteries that supply the arms and legs with blood. Allow thirty minutes for this exam. There are no restrictions or special instructions.  Follow-Up: At [facility], you and your health needs are our priority.  As part of our continuing mission to provide you with exceptional heart care, we have created designated Provider Care Teams.  These Care Teams include your primary Cardiologist (physician) and Advanced Practice Providers (APPs -  Physician Assistants and Nurse Practitioners) who all work together to provide you with the care you need, when you need it.    Your next appointment:   1 year(s)  The format for your next appointment:   In Person  Provider:   Quay Burow, MD

## 2020-02-24 NOTE — Progress Notes (Signed)
02/24/2020 Ashley [name]   05/27/1966  [id number]  Primary Physician Prince Solian, MD Primary Cardiologist: Lorretta Harp MD [name], [place]  HPI:  Ashley [name] is a 54 y.o.  moderately overweight married Caucasian female mother of 1 son, grandmother to one grandchild referred by Dr. Paulla Dolly for peripheral vascular valuation because of poorly palpable pulses and leg pain.  I last saw her in the office 04/24/2019. 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 She has a history of ongoing tobacco abuse of 4 cigarettes a day, treated hypertension, diabetes and hyperlipidemia. Her father died of a myocardial infarction at age 39. She is never had a heart attack or stroke and denies chest pain or shortness of breath. She has been on insulin pump for last 26 years. She has had bilateral lower extremity lifestyle and claudication for last 2 years prior to her intervention treated as orthopedic or neurovascular pain, with recent lower extremity Dopplers performed in our office 12/16/2018 revealing a right ABI 0.64 and a left 0.55. She had high-grade bilateral iliac disease.  I performed peripheral angiography on her 01/01/2019 revealing high-grade distal abdominal aortic stenosis. I performed bilateral PTA and covered stenting using "kissing stent technique and rebuilding her aortic bifurcation. Her follow-up Dopplers normalized and her claudication has resolved. She does have some swelling in her lower extremities potentially related to the increased blood flow as result of her intervention. I am going to put her on low-dose diuretic and decrease her amlodipine dose. She remains on dual antiplatelet therapy.  Continues to smoke however.  Since I saw her in the office a year ago she continues to do well.  She does continue to smoke a half a pack a day.  She denies chest pain shortness of breath or claudication.  She does have some  intermittent lower extremity edema.  Her primary care physician recently doubled her amlodipine.  Lower extremity arterial Doppler studies recently performed 02/02/2020 revealed normal ABIs with increased velocities in her left iliac artery.   Current Meds  Medication Sig  . ACCU-CHEK COMPACT PLUS test strip 1 each by Other route as needed.   Marland Kitchen ACCU-CHEK SOFTCLIX LANCETS lancets   . albuterol (PROVENTIL) (2.5 MG/3ML) 0.083% nebulizer solution Take 2.5 mg by nebulization every 4 (four) hours as needed for wheezing or shortness of breath.   . ALPRAZolam (XANAX) 0.5 MG tablet Take 0.5 mg by mouth 3 (three) times daily as needed for anxiety.   . AMBULATORY NON FORMULARY MEDICATION NOVOLOG INSULIN PUMP USES AS DIRECTED  . amLODipine (NORVASC) 5 MG tablet Take 1 tablet (5 mg total) by mouth daily. (Patient taking differently: Take 5 mg by mouth in the morning and at bedtime. )  . aspirin EC 81 MG EC tablet Take 1 tablet (81 mg total) by mouth daily.  . Azelastine HCl 0.15 % SOLN Place 1 spray into the nose daily.  . busPIRone (BUSPAR) 15 MG tablet Take 15 mg by mouth 2 (two) times daily.  . clopidogrel (PLAVIX) 75 MG tablet TAKE 1 TABLET (75 MG TOTAL) BY MOUTH DAILY WITH BREAKFAST.  . cyanocobalamin (,VITAMIN B-12,) 1000 MCG/ML injection Inject 1,000 mcg into the muscle every 30 (thirty) days.   . DULoxetine (CYMBALTA) 30 MG capsule Take 30 mg by mouth every morning. Takes 90 mg total  . DULoxetine (CYMBALTA) 60 MG capsule Take 60 mg by mouth every morning. Takes 90 mg total  .  famotidine (PEPCID) 40 MG tablet Take 1 tablet (40 mg total) by mouth at bedtime.  . fluticasone (FLONASE) 50 MCG/ACT nasal spray Place 1 spray into the nose daily.   . folic acid (FOLVITE) 1 MG tablet Take 1 tablet by mouth daily.  . Insulin Human (INSULIN PUMP) SOLN Inject into the skin.  Marland Kitchen lansoprazole (PREVACID) 30 MG capsule Take 1 capsule (30 mg total) by mouth in the morning and at bedtime.  Marland Kitchen latanoprost (XALATAN)  0.005 % ophthalmic solution Place 1 drop into both eyes at bedtime.  Marland Kitchen levocetirizine (XYZAL) 5 MG tablet Take 5 mg by mouth every morning.   Marland Kitchen levothyroxine (SYNTHROID, LEVOTHROID) 112 MCG tablet Take 112 mcg by mouth daily before breakfast.   . losartan (COZAAR) 100 MG tablet Take 100 mg by mouth at bedtime.   . methotrexate (RHEUMATREX) 2.5 MG tablet 5 tablets by moth twice a day once a week  . metoprolol (TOPROL-XL) 200 MG 24 hr tablet Take 200 mg by mouth daily. One tablet by mouth once daily   . montelukast (SINGULAIR) 10 MG tablet Take 10 mg by mouth at bedtime.  Marland Kitchen NOVOLOG 100 UNIT/ML injection Inject into the skin See admin instructions. Pt has Insulin pump  . ondansetron (ZOFRAN-ODT) 8 MG disintegrating tablet DISSOLVE 1 TABLET ON THE TONGUE AND SWALLOW EVERY 6 TO 8 HOURS AS NEEDED FOR NAUSEA AND VOMITING  . pregabalin (LYRICA) 75 MG capsule Take 150 mg by mouth at bedtime.   Marland Kitchen PROAIR HFA 108 (90 BASE) MCG/ACT inhaler Inhale 2 puffs into the lungs every 4 (four) hours as needed for wheezing or shortness of breath.   . promethazine (PHENERGAN) 25 MG tablet Take 25 mg by mouth every 4 (four) hours as needed for nausea or vomiting. Every 4-6 hours as needed  . rosuvastatin (CRESTOR) 20 MG tablet Take 20 mg by mouth at bedtime.  . Vitamin D, Ergocalciferol, (DRISDOL) 50000 UNITS CAPS Take 50,000 Units by mouth every 7 (seven) days.     Allergies  Allergen Reactions  . Clindamycin Hives  . Other Hives    Tylox   . Sulfonamide Derivatives Hives  . Lipitor [Atorvastatin] Other (See Comments)    MUSCLE ACHES  . Restasis [Cyclosporine]     Pt stated, "My eyes turned red on the inside and outside of eye; burning sensation"  . Topamax [Topiramate] Itching    Social History   Socioeconomic History  . Marital status: Married    Spouse name: Not on file  . Number of children: 1  . Years of education: Not on file  . Highest education level: Not on file  Occupational History  . Not on  file  Tobacco Use  . Smoking status: Current Some Day Smoker    Packs/day: 0.50    Years: 25.00    Pack years: 12.50    Types: Cigarettes  . Smokeless tobacco: Never Used  . Tobacco comment: 6-8 CIGS A DAY  Vaping Use  . Vaping Use: Never used  Substance and Sexual Activity  . Alcohol use: No    Alcohol/week: 0.0 standard drinks  . Drug use: No  . Sexual activity: Not on file  Other Topics Concern  . Not on file  Social History Narrative   Lives home with husband.  Education 12th grade.  Caffeine 4-5 drinks daily.   Social Determinants of Health   Financial Resource Strain:   . Difficulty of Paying Living Expenses: Not on file  Food Insecurity:   . Worried  About Running Out of Food in the Last Year: Not on file  . Ran Out of Food in the Last Year: Not on file  Transportation Needs:   . Lack of Transportation (Medical): Not on file  . Lack of Transportation (Non-Medical): Not on file  Physical Activity:   . Days of Exercise per Week: Not on file  . Minutes of Exercise per Session: Not on file  Stress:   . Feeling of Stress : Not on file  Social Connections:   . Frequency of Communication with Friends and Family: Not on file  . Frequency of Social Gatherings with Friends and Family: Not on file  . Attends Religious Services: Not on file  . Active Member of Clubs or Organizations: Not on file  . Attends Archivist Meetings: Not on file  . Marital Status: Not on file  Intimate Partner Violence:   . Fear of Current or Ex-Partner: Not on file  . Emotionally Abused: Not on file  . Physically Abused: Not on file  . Sexually Abused: Not on file     Review of Systems: General: negative for chills, fever, night sweats or weight changes.  Cardiovascular: negative for chest pain, dyspnea on exertion, edema, orthopnea, palpitations, paroxysmal nocturnal dyspnea or shortness of breath Dermatological: negative for rash Respiratory: negative for cough or  wheezing Urologic: negative for hematuria Abdominal: negative for nausea, vomiting, diarrhea, bright red blood per rectum, melena, or hematemesis Neurologic: negative for visual changes, syncope, or dizziness All other systems reviewed and are otherwise negative except as noted above.    Blood pressure 118/78, pulse 77, height 4\' 11"  (1.499 m), weight 151 lb (68.5 kg), SpO2 98 %.  General appearance: alert and no distress Neck: no adenopathy, no carotid bruit, no JVD, supple, symmetrical, trachea midline and thyroid not enlarged, symmetric, no tenderness/mass/nodules Lungs: clear to auscultation bilaterally Heart: regular rate and rhythm, S1, S2 normal, no murmur, click, rub or gallop Extremities: extremities normal, atraumatic, no cyanosis or edema Pulses: 2+ and symmetric Skin: Skin color, texture, turgor normal. No rashes or lesions Neurologic: Alert and oriented X 3, normal strength and tone. Normal symmetric reflexes. Normal coordination and gait  EKG sinus rhythm at 77 without ST or T wave changes.  I personally reviewed this EKG.  ASSESSMENT AND PLAN:   Hyperlipidemia History of hyperlipidemia on statin therapy with lipid profile performed 10/02/2019 revealing a total cholesterol of 122, LDL 65 HDL 36.  Essential hypertension History of essential hypertension a blood pressure measured today 118/78.  She is on amlodipine 5 mg p.o. twice daily as well as metoprolol and losartan.  I suspect the amlodipine may be contributing to her lower extreme extremity edema and I would recommend that she cut this in half and add hydrochlorothiazide but will leave this up to her primary care physician.  Claudication in peripheral vascular disease ([place]) History of PAD status post bilateral iliac PTA and covered stenting using "kissing stent technique for claudication 01/01/2019.  Afterwards her claudication resolved her Dopplers normalized.  Recent lower extremity arterial Doppler suggest some  progression of disease in the left iliac although her ABIs have remained stable and she still denies claudication.  Tobacco abuse Continue tobacco abuse of 1/2 pack/day recalcitrant risk factor modification.      Lorretta Harp MD FACP,FACC,FAHA, [facility] 02/24/2020 5:08 PM

## 2020-02-24 NOTE — Assessment & Plan Note (Signed)
History of PAD status post bilateral iliac PTA and covered stenting using "kissing stent technique for claudication 01/01/2019.  Afterwards her claudication resolved her Dopplers normalized.  Recent lower extremity arterial Doppler suggest some progression of disease in the left iliac although her ABIs have remained stable and she still denies claudication.

## 2020-02-24 NOTE — Assessment & Plan Note (Signed)
Continue tobacco abuse of 1/2 pack/day recalcitrant risk factor modification.

## 2020-02-24 NOTE — Assessment & Plan Note (Signed)
History of hyperlipidemia on statin therapy with lipid profile performed 10/02/2019 revealing a total cholesterol of 122, LDL 65 HDL 36.

## 2020-02-24 NOTE — Telephone Encounter (Signed)
Refilled Pepcid

## 2020-02-25 ENCOUNTER — Other Ambulatory Visit: Payer: Self-pay | Admitting: Cardiovascular Disease

## 2020-03-01 ENCOUNTER — Ambulatory Visit: Payer: Medicare HMO | Admitting: Family Medicine

## 2020-04-08 DIAGNOSIS — K219 Gastro-esophageal reflux disease without esophagitis: Secondary | ICD-10-CM | POA: Diagnosis not present

## 2020-04-08 DIAGNOSIS — K3184 Gastroparesis: Secondary | ICD-10-CM | POA: Diagnosis not present

## 2020-04-08 DIAGNOSIS — F418 Other specified anxiety disorders: Secondary | ICD-10-CM | POA: Diagnosis not present

## 2020-04-08 DIAGNOSIS — G4733 Obstructive sleep apnea (adult) (pediatric): Secondary | ICD-10-CM | POA: Diagnosis not present

## 2020-04-08 DIAGNOSIS — F17200 Nicotine dependence, unspecified, uncomplicated: Secondary | ICD-10-CM | POA: Diagnosis not present

## 2020-04-08 DIAGNOSIS — E785 Hyperlipidemia, unspecified: Secondary | ICD-10-CM | POA: Diagnosis not present

## 2020-04-08 DIAGNOSIS — I10 Essential (primary) hypertension: Secondary | ICD-10-CM | POA: Diagnosis not present

## 2020-04-08 DIAGNOSIS — E1040 Type 1 diabetes mellitus with diabetic neuropathy, unspecified: Secondary | ICD-10-CM | POA: Diagnosis not present

## 2020-04-08 DIAGNOSIS — I739 Peripheral vascular disease, unspecified: Secondary | ICD-10-CM | POA: Diagnosis not present

## 2020-04-25 DIAGNOSIS — F17200 Nicotine dependence, unspecified, uncomplicated: Secondary | ICD-10-CM | POA: Diagnosis not present

## 2020-04-25 DIAGNOSIS — I10 Essential (primary) hypertension: Secondary | ICD-10-CM | POA: Diagnosis not present

## 2020-04-25 DIAGNOSIS — J069 Acute upper respiratory infection, unspecified: Secondary | ICD-10-CM | POA: Diagnosis not present

## 2020-04-25 DIAGNOSIS — R059 Cough, unspecified: Secondary | ICD-10-CM | POA: Diagnosis not present

## 2020-04-25 DIAGNOSIS — Z1152 Encounter for screening for COVID-19: Secondary | ICD-10-CM | POA: Diagnosis not present

## 2020-04-25 DIAGNOSIS — J029 Acute pharyngitis, unspecified: Secondary | ICD-10-CM | POA: Diagnosis not present

## 2020-04-27 DIAGNOSIS — E109 Type 1 diabetes mellitus without complications: Secondary | ICD-10-CM | POA: Diagnosis not present

## 2020-05-05 ENCOUNTER — Emergency Department (HOSPITAL_COMMUNITY): Payer: Medicare HMO

## 2020-05-05 ENCOUNTER — Emergency Department (HOSPITAL_COMMUNITY)
Admission: EM | Admit: 2020-05-05 | Discharge: 2020-05-05 | Disposition: A | Discharge status: Home / Self Care | Payer: Medicare HMO | Attending: Emergency Medicine | Admitting: Emergency Medicine

## 2020-05-05 ENCOUNTER — Encounter (HOSPITAL_COMMUNITY): Payer: Self-pay | Admitting: *Deleted

## 2020-05-05 ENCOUNTER — Other Ambulatory Visit: Payer: Self-pay

## 2020-05-05 DIAGNOSIS — J432 Centrilobular emphysema: Secondary | ICD-10-CM | POA: Diagnosis not present

## 2020-05-05 DIAGNOSIS — A419 Sepsis, unspecified organism: Secondary | ICD-10-CM | POA: Diagnosis not present

## 2020-05-05 DIAGNOSIS — R911 Solitary pulmonary nodule: Secondary | ICD-10-CM | POA: Diagnosis not present

## 2020-05-05 DIAGNOSIS — R404 Transient alteration of awareness: Secondary | ICD-10-CM | POA: Diagnosis not present

## 2020-05-05 DIAGNOSIS — E039 Hypothyroidism, unspecified: Secondary | ICD-10-CM | POA: Insufficient documentation

## 2020-05-05 DIAGNOSIS — E162 Hypoglycemia, unspecified: Secondary | ICD-10-CM | POA: Diagnosis not present

## 2020-05-05 DIAGNOSIS — R29810 Facial weakness: Secondary | ICD-10-CM | POA: Diagnosis not present

## 2020-05-05 DIAGNOSIS — J069 Acute upper respiratory infection, unspecified: Secondary | ICD-10-CM | POA: Diagnosis not present

## 2020-05-05 DIAGNOSIS — Z79899 Other long term (current) drug therapy: Secondary | ICD-10-CM | POA: Diagnosis not present

## 2020-05-05 DIAGNOSIS — R070 Pain in throat: Secondary | ICD-10-CM | POA: Diagnosis present

## 2020-05-05 DIAGNOSIS — F17210 Nicotine dependence, cigarettes, uncomplicated: Secondary | ICD-10-CM | POA: Diagnosis not present

## 2020-05-05 DIAGNOSIS — N200 Calculus of kidney: Secondary | ICD-10-CM | POA: Diagnosis not present

## 2020-05-05 DIAGNOSIS — E11649 Type 2 diabetes mellitus with hypoglycemia without coma: Secondary | ICD-10-CM | POA: Diagnosis not present

## 2020-05-05 DIAGNOSIS — Z90710 Acquired absence of both cervix and uterus: Secondary | ICD-10-CM | POA: Diagnosis not present

## 2020-05-05 DIAGNOSIS — E1140 Type 2 diabetes mellitus with diabetic neuropathy, unspecified: Secondary | ICD-10-CM | POA: Diagnosis not present

## 2020-05-05 DIAGNOSIS — R4182 Altered mental status, unspecified: Secondary | ICD-10-CM | POA: Diagnosis not present

## 2020-05-05 DIAGNOSIS — E161 Other hypoglycemia: Secondary | ICD-10-CM | POA: Diagnosis not present

## 2020-05-05 DIAGNOSIS — R059 Cough, unspecified: Secondary | ICD-10-CM | POA: Diagnosis not present

## 2020-05-05 DIAGNOSIS — R4020 Unspecified coma: Secondary | ICD-10-CM | POA: Diagnosis not present

## 2020-05-05 DIAGNOSIS — R109 Unspecified abdominal pain: Secondary | ICD-10-CM | POA: Diagnosis not present

## 2020-05-05 DIAGNOSIS — R519 Headache, unspecified: Secondary | ICD-10-CM | POA: Diagnosis not present

## 2020-05-05 DIAGNOSIS — I700 Atherosclerosis of aorta: Secondary | ICD-10-CM | POA: Diagnosis not present

## 2020-05-05 DIAGNOSIS — T1490XA Injury, unspecified, initial encounter: Secondary | ICD-10-CM

## 2020-05-05 DIAGNOSIS — Z20822 Contact with and (suspected) exposure to covid-19: Secondary | ICD-10-CM | POA: Insufficient documentation

## 2020-05-05 LAB — COMPREHENSIVE METABOLIC PANEL
ALT: 32 U/L (ref 0–44)
AST: 21 U/L (ref 15–41)
Albumin: 3.9 g/dL (ref 3.5–5.0)
Alkaline Phosphatase: 71 U/L (ref 38–126)
Anion gap: 9 (ref 5–15)
BUN: 17 mg/dL (ref 6–20)
CO2: 24 mmol/L (ref 22–32)
Calcium: 8.5 mg/dL — ABNORMAL LOW (ref 8.9–10.3)
Chloride: 107 mmol/L (ref 98–111)
Creatinine, Ser: 0.74 mg/dL (ref 0.44–1.00)
GFR, Estimated: >60 mL/min (ref >60)
Glucose, Bld: 82 mg/dL (ref 70–99)
Potassium: 4.5 mmol/L (ref 3.5–5.1)
Sodium: 140 mmol/L (ref 135–145)
Total Bilirubin: 0.5 mg/dL (ref 0.3–1.2)
Total Protein: 6.1 g/dL — ABNORMAL LOW (ref 6.5–8.1)

## 2020-05-05 LAB — URINALYSIS, ROUTINE W REFLEX MICROSCOPIC
Bilirubin Urine: NEGATIVE
Glucose, UA: 150 mg/dL — AB
Hgb urine dipstick: NEGATIVE
Ketones, ur: NEGATIVE mg/dL
Leukocytes,Ua: NEGATIVE
Nitrite: NEGATIVE
Protein, ur: NEGATIVE mg/dL
Specific Gravity, Urine: 1.014 (ref 1.005–1.030)
pH: 5 (ref 5.0–8.0)

## 2020-05-05 LAB — CBC WITH DIFFERENTIAL/PLATELET
Abs Immature Granulocytes: 0.21 10*3/uL — ABNORMAL HIGH (ref 0.00–0.07)
Basophils Absolute: 0.1 10*3/uL (ref 0.0–0.1)
Basophils Relative: 0 %
Eosinophils Absolute: 0 10*3/uL (ref 0.0–0.5)
Eosinophils Relative: 0 %
HCT: 43.9 % (ref 36.0–46.0)
Hemoglobin: 14.2 g/dL (ref 12.0–15.0)
Immature Granulocytes: 1 %
Lymphocytes Relative: 10 %
Lymphs Abs: 2.4 10*3/uL (ref 0.7–4.0)
MCH: 30.4 pg (ref 26.0–34.0)
MCHC: 32.3 g/dL (ref 30.0–36.0)
MCV: 94 fL (ref 80.0–100.0)
Monocytes Absolute: 1.8 10*3/uL — ABNORMAL HIGH (ref 0.1–1.0)
Monocytes Relative: 8 %
Neutro Abs: 19.3 10*3/uL — ABNORMAL HIGH (ref 1.7–7.7)
Neutrophils Relative %: 81 %
Platelets: 226 10*3/uL (ref 150–400)
RBC: 4.67 MIL/uL (ref 3.87–5.11)
RDW: 15.2 % (ref 11.5–15.5)
WBC: 23.9 10*3/uL — ABNORMAL HIGH (ref 4.0–10.5)
nRBC: 0 % (ref 0.0–0.2)

## 2020-05-05 LAB — RESPIRATORY PANEL BY PCR

## 2020-05-05 LAB — APTT: aPTT: 36 seconds (ref 24–36)

## 2020-05-05 LAB — PROTIME-INR
INR: 2.2 — ABNORMAL HIGH (ref 0.8–1.2)
Prothrombin Time: 23.7 seconds — ABNORMAL HIGH (ref 11.4–15.2)

## 2020-05-05 LAB — I-STAT VENOUS BLOOD GAS, ED
Acid-Base Excess: 1 mmol/L (ref 0.0–2.0)
Bicarbonate: 29.6 mmol/L — ABNORMAL HIGH (ref 20.0–28.0)
Calcium, Ion: 1.2 mmol/L (ref 1.15–1.40)
HCT: 46 % (ref 36.0–46.0)
Hemoglobin: 15.6 g/dL — ABNORMAL HIGH (ref 12.0–15.0)
O2 Saturation: 93 %
Potassium: 6.1 mmol/L — ABNORMAL HIGH (ref 3.5–5.1)
Sodium: 138 mmol/L (ref 135–145)
TCO2: 32 mmol/L (ref 22–32)
pCO2, Ven: 64.4 mmHg — ABNORMAL HIGH (ref 44.0–60.0)
pH, Ven: 7.271 (ref 7.250–7.430)
pO2, Ven: 77 mmHg — ABNORMAL HIGH (ref 32.0–45.0)

## 2020-05-05 LAB — I-STAT BETA HCG BLOOD, ED (MC, WL, AP ONLY): I-stat hCG, quantitative: <5 m[IU]/mL (ref <5)

## 2020-05-05 LAB — LACTIC ACID, PLASMA
Lactic Acid, Venous: 2.6 mmol/L (ref 0.5–1.9)
Lactic Acid, Venous: 1 mmol/L (ref 0.5–1.9)

## 2020-05-05 LAB — CBG MONITORING, ED
Glucose-Capillary: 63 mg/dL — ABNORMAL LOW (ref 70–99)
Glucose-Capillary: 317 mg/dL — ABNORMAL HIGH (ref 70–99)
Glucose-Capillary: 120 mg/dL — ABNORMAL HIGH (ref 70–99)

## 2020-05-05 LAB — RESP PANEL BY RT-PCR (FLU A&B, COVID) ARPGX2
Influenza A by PCR: NEGATIVE
Influenza B by PCR: NEGATIVE
SARS Coronavirus 2 by RT PCR: NEGATIVE

## 2020-05-05 LAB — ACETAMINOPHEN LEVEL: Acetaminophen (Tylenol), Serum: <10 ug/mL — ABNORMAL LOW (ref 10–30)

## 2020-05-05 LAB — MONONUCLEOSIS SCREEN: Mono Screen: NEGATIVE

## 2020-05-05 LAB — TROPONIN I (HIGH SENSITIVITY)
Troponin I (High Sensitivity): 8 ng/L (ref <18)
Troponin I (High Sensitivity): 11 ng/L (ref <18)

## 2020-05-05 LAB — ETHANOL: Alcohol, Ethyl (B): <10 mg/dL (ref <10)

## 2020-05-05 LAB — RAPID URINE DRUG SCREEN, HOSP PERFORMED
Amphetamines: NOT DETECTED
Barbiturates: NOT DETECTED
Benzodiazepines: POSITIVE — AB
Cocaine: NOT DETECTED
Opiates: POSITIVE — AB
Tetrahydrocannabinol: NOT DETECTED

## 2020-05-05 LAB — LIPASE, BLOOD: Lipase: 21 U/L (ref 11–51)

## 2020-05-05 LAB — GROUP A STREP BY PCR: Group A Strep by PCR: NOT DETECTED

## 2020-05-05 LAB — SALICYLATE LEVEL: Salicylate Lvl: <7 mg/dL — ABNORMAL LOW (ref 7.0–30.0)

## 2020-05-05 LAB — TSH: TSH: 3.662 u[IU]/mL (ref 0.350–4.500)

## 2020-05-05 MED ORDER — SODIUM CHLORIDE 0.9 % IV BOLUS
1000.0000 mL | Freq: Once | INTRAVENOUS | Status: AC
  Administered 2020-05-05: 1000 mL via INTRAVENOUS

## 2020-05-05 MED ORDER — SODIUM CHLORIDE 0.9 % IV BOLUS
500.0000 mL | Freq: Once | INTRAVENOUS | Status: AC
  Administered 2020-05-05: 500 mL via INTRAVENOUS

## 2020-05-05 MED ORDER — IOHEXOL 300 MG/ML  SOLN
100.0000 mL | Freq: Once | INTRAMUSCULAR | Status: AC | PRN
  Administered 2020-05-05: 100 mL via INTRAVENOUS

## 2020-05-05 MED ORDER — INSULIN ASPART 100 UNIT/ML ~~LOC~~ SOLN
15.0000 [IU] | Freq: Once | SUBCUTANEOUS | Status: AC
  Administered 2020-05-05: 15 [IU] via SUBCUTANEOUS

## 2020-05-05 MED ORDER — SODIUM CHLORIDE 0.9 % IV SOLN
2.0000 g | Freq: Once | INTRAVENOUS | Status: AC
  Administered 2020-05-05: 2 g via INTRAVENOUS
  Filled 2020-05-05: qty 2

## 2020-05-05 MED ORDER — LACTATED RINGERS IV SOLN: INTRAVENOUS | Status: DC

## 2020-05-05 MED ORDER — SODIUM CHLORIDE 0.9 % IV SOLN
2.0000 g | Freq: Three times a day (TID) | INTRAVENOUS | Status: DC
  Administered 2020-05-05: 2 g via INTRAVENOUS
  Filled 2020-05-05: qty 2

## 2020-05-05 MED ORDER — METRONIDAZOLE IN NACL 5-0.79 MG/ML-% IV SOLN
500.0000 mg | Freq: Once | INTRAVENOUS | Status: AC
  Administered 2020-05-05: 500 mg via INTRAVENOUS
  Filled 2020-05-05: qty 100

## 2020-05-05 MED ORDER — VANCOMYCIN HCL IN DEXTROSE 1-5 GM/200ML-% IV SOLN
1000.0000 mg | Freq: Once | INTRAVENOUS | Status: AC
  Administered 2020-05-05: 1000 mg via INTRAVENOUS
  Filled 2020-05-05: qty 200

## 2020-05-05 MED ORDER — ACETAMINOPHEN 325 MG PO TABS
650.0000 mg | ORAL_TABLET | Freq: Once | ORAL | Status: AC
  Administered 2020-05-05: 650 mg via ORAL
  Filled 2020-05-05: qty 2

## 2020-05-05 MED ORDER — VANCOMYCIN HCL 750 MG/150ML IV SOLN
750.0000 mg | Freq: Two times a day (BID) | INTRAVENOUS | Status: DC
  Filled 2020-05-05: qty 150

## 2020-05-05 NOTE — ED Provider Notes (Signed)
Emergency Department Provider Note   I have reviewed the triage vital signs and the nursing notes.   HISTORY  Chief Complaint Altered Mental Status   HPI Ashley [name] is a 54 y.o. female with PMH of IDDM, HLD, HTN, and Graves disease presents to the ED after being found unresponsive by husband.  Patient tells me that she has been on multiple antibiotics with 2 weeks of sore throat, earache, upper respiratory infection symptoms.  She is currently taking Levaquin.  She is noticed some associated elevated blood sugars at home despite a well-functioning insulin pump.  She denies having chest discomfort.  She does not recall the events surrounding the EMS call.  EMS arrived on scene to find the patient posturing and sonorous.  They noted unequal pupils and blood sugar of 47.  She was given 1 mg of glucagon IM and blood sugar improved.  Mental status improved in route.   Patient denies any history of seizure.  She states that in the setting of her URI symptoms she has not been eating well or drinking fluids.  She suspects subjective fever at home but cannot give a clear timeline regarding this.  She is not having headache.  She is experiencing some cramping in the legs and is feeling cold.    Past Medical History:  Diagnosis Date  . Anxiety   . Asthma   . Depression   . Diabetes mellitus ([place])   . Esophageal stricture   . Gastroparesis   . GERD (gastroesophageal reflux disease)   . Glaucoma   . Graves disease 02/2010   diagnosed by radioactive iodine uptake and scan   . Helicobacter pylori infection   . Hyperlipidemia   . Hypertension   . Hypothyroidism   . Insulin pump in place   . Nephrolithiasis 2010   rt sided per GI  . Neuropathy    diabetic neuropathy  . Osteoporosis   . Sleep apnea   . Sleep apnea with use of continuous positive airway pressure (CPAP)   . Vitamin D deficiency     Patient Active Problem List   Diagnosis Date Noted  . Tobacco abuse 02/24/2020    . Claudication in peripheral vascular disease ([place]) 01/01/2019  . Essential hypertension 12/25/2018  . Hyperlipidemia 12/25/2018  . Peripheral arterial disease ([place]) 12/25/2018  . OSA on CPAP 10/08/2018  . Loud snoring 05/19/2018  . OSA and COPD overlap syndrome ([place]) 04/02/2018  . Excessive daytime sleepiness 04/02/2018  . Delayed sleep phase syndrome 04/02/2018  . Moderate persistent asthma with exacerbation 04/02/2018  . Dyspnea and respiratory abnormality 04/26/2015  . Chest tightness 04/26/2015  . Hoarseness of voice 04/26/2015  . Nausea with vomiting 10/22/2012  . Diarrhea 10/22/2012  . GASTROPARESIS 03/22/2009  . DYSPHAGIA 03/22/2009  . DIAB W/O MENTION COMP TYPE I [JUV TYPE] UNCNTRL 02/28/2009  . GERD 02/28/2009  . DYSPHAGIA UNSPECIFIED 02/28/2009  . ABDOMINAL PAIN-EPIGASTRIC 02/28/2009    Past Surgical History:  Procedure Laterality Date  . ABDOMINAL AORTOGRAM W/LOWER EXTREMITY Bilateral 01/01/2019   Procedure: ABDOMINAL AORTOGRAM W/LOWER EXTREMITY;  Surgeon: Lorretta Harp, MD;  Location: [facility];  Service: Cardiovascular;  Laterality: Bilateral;  . BREAST EXCISIONAL BIOPSY    . BREAST LUMPECTOMY Left    LEFT-BENIGN  . NASAL SEPTOPLASTY W/ TURBINOPLASTY Bilateral 09/07/2013   Procedure: SEPTOPLASTY, BILATERAL TURBINATE RESECTION ;  Surgeon: Ascencion Dike, MD;  Location: [place];  Service: ENT;  Laterality: Bilateral;  . PERIPHERAL VASCULAR INTERVENTION Bilateral  01/01/2019   Procedure: PERIPHERAL VASCULAR INTERVENTION;  Surgeon: Lorretta Harp, MD;  Location: [place] [facility];  Service: Cardiovascular;  Laterality: Bilateral;  . UPPER GASTROINTESTINAL ENDOSCOPY    . VAGINAL HYSTERECTOMY  2006    Allergies Mirtazapine, Clindamycin, Other, Sulfonamide derivatives, Lipitor [atorvastatin], Restasis [cyclosporine], and Topamax [topiramate]  Family History  Problem Relation Age of Onset  . Diabetes Father   . Heart attack Father   .  Alcoholism Father   . Lung cancer Mother   . Liver cancer Mother   . Irritable bowel syndrome Sister   . Colon cancer Neg Hx   . Esophageal cancer Neg Hx   . Rectal cancer Neg Hx   . Stomach cancer Neg Hx     Social History Social History   Tobacco Use  . Smoking status: Current Some Day Smoker    Packs/day: 0.50    Years: 25.00    Pack years: 12.50    Types: Cigarettes  . Smokeless tobacco: Never Used  . Tobacco comment: 6-8 CIGS A DAY  Vaping Use  . Vaping Use: Never used  Substance Use Topics  . Alcohol use: No    Alcohol/week: 0.0 standard drinks  . Drug use: No    Review of Systems  Constitutional: Subjective fever/chills and weakness (generalized). Episode of unresponsiveness.  Eyes: No visual changes. ENT: mild sore throat and earache.  Cardiovascular: Denies chest pain. Respiratory: Denies shortness of breath. Positive cough.  Gastrointestinal: No abdominal pain.  No nausea, no vomiting.  No diarrhea.  No constipation. Genitourinary: Negative for dysuria. Musculoskeletal: Negative for back pain. Diffuse muscle aches.  Skin: Negative for rash. Neurological: Negative for headaches, focal weakness or numbness.  10-point ROS otherwise negative.  ____________________________________________   PHYSICAL EXAM:  VITAL SIGNS: ED Triage Vitals [05/05/20 0038]  Enc Vitals Group     BP 139/85     Pulse Rate 82     Resp 16     Temp (!) 97.1 F (36.2 C)     Temp Source Oral     SpO2 100 %   Constitutional: Opens eyes to voice. Provides history and participates with exam. No acute distress.  Eyes: Conjunctivae are normal. PERRL (53mm b/l). EOMI.  Head: Atraumatic. Nose: Mild congestion/rhinnorhea. Mouth/Throat: Mucous membranes are dry. Oropharynx non-erythematous. Neck: No stridor.   Cardiovascular: Normal rate, regular rhythm. Good peripheral circulation. Grossly normal heart sounds.   Respiratory: Normal respiratory effort.  No retractions. Lungs  CTAB. Gastrointestinal: Soft and nontender. No distention.  Musculoskeletal: No lower extremity tenderness nor edema. No gross deformities of extremities. Neurologic:  Normal speech and language. No gross focal neurologic deficits are appreciated. 5/5 strength in the B/L upper and lower extremities.  Skin:  Skin is warm, dry and intact. No rash noted.  ____________________________________________   LABS (all labs ordered are listed, but only abnormal results are displayed)  Labs Reviewed  ACETAMINOPHEN LEVEL - Abnormal; Notable for the following components:      Result Value   Acetaminophen (Tylenol), Serum <10 (*)    All other components within normal limits  SALICYLATE LEVEL - Abnormal; Notable for the following components:   Salicylate Lvl <2.6 (*)    All other components within normal limits  LACTIC ACID, PLASMA - Abnormal; Notable for the following components:   Lactic Acid, Venous 2.6 (*)    All other components within normal limits  CBC WITH DIFFERENTIAL/PLATELET - Abnormal; Notable for the following components:   WBC 23.9 (*)  Neutro Abs 19.3 (*)    Monocytes Absolute 1.8 (*)    Abs Immature Granulocytes 0.21 (*)    All other components within normal limits  URINALYSIS, ROUTINE W REFLEX MICROSCOPIC - Abnormal; Notable for the following components:   Glucose, UA 150 (*)    All other components within normal limits  RAPID URINE DRUG SCREEN, HOSP PERFORMED - Abnormal; Notable for the following components:   Opiates POSITIVE (*)    Benzodiazepines POSITIVE (*)    All other components within normal limits  COMPREHENSIVE METABOLIC PANEL - Abnormal; Notable for the following components:   Calcium 8.5 (*)    Total Protein 6.1 (*)    All other components within normal limits  PROTIME-INR - Abnormal; Notable for the following components:   Prothrombin Time 23.7 (*)    INR 2.2 (*)    All other components within normal limits  I-STAT VENOUS BLOOD GAS, ED - Abnormal; Notable  for the following components:   pCO2, Ven 64.4 (*)    pO2, Ven 77.0 (*)    Bicarbonate 29.6 (*)    Potassium 6.1 (*)    Hemoglobin 15.6 (*)    All other components within normal limits  CBG MONITORING, ED - Abnormal; Notable for the following components:   Glucose-Capillary 63 (*)    All other components within normal limits  CBG MONITORING, ED - Abnormal; Notable for the following components:   Glucose-Capillary 317 (*)    All other components within normal limits  CBG MONITORING, ED - Abnormal; Notable for the following components:   Glucose-Capillary 120 (*)    All other components within normal limits  CULTURE, BLOOD (ROUTINE X 2)  CULTURE, BLOOD (ROUTINE X 2)  URINE CULTURE  RESP PANEL BY RT-PCR (FLU A&B, COVID) ARPGX2  GROUP A STREP BY PCR  RESPIRATORY PANEL BY PCR  ETHANOL  LACTIC ACID, PLASMA  TSH  LIPASE, BLOOD  APTT  MONONUCLEOSIS SCREEN  I-STAT BETA HCG BLOOD, ED ([facility], [facility], AP ONLY)  TROPONIN I (HIGH SENSITIVITY)  TROPONIN I (HIGH SENSITIVITY)   ____________________________________________  EKG   EKG Interpretation  Date/Time:  Thursday May 05 2020 01:27:27 EST Ventricular Rate:  83 PR Interval:    QRS Duration: 97 QT Interval:  385 QTC Calculation: 453 R Axis:   75 Text Interpretation: Sinus rhythm Probable left atrial enlargement No STEMI Confirmed by Nanda Quinton [phone]) on 05/05/2020 1:43:20 AM Also confirmed by Nanda Quinton [phone]), editor Hattie Perch ([id number])  on 05/06/2020 7:00:44 AM       ____________________________________________  RADIOLOGY  CXR reviewed.   CT chest, abdomen, and pelvis reviewed.  ____________________________________________   PROCEDURES  Procedure(s) performed:   Procedures  CRITICAL CARE Performed by: Margette Fast Total critical care time: 35 minutes Critical care time was exclusive of separately billable procedures and treating other patients. Critical care was necessary to treat or prevent  imminent or life-threatening deterioration. Critical care was time spent personally by me on the following activities: development of treatment plan with patient and/or surrogate as well as nursing, discussions with consultants, evaluation of patient's response to treatment, examination of patient, obtaining history from patient or surrogate, ordering and performing treatments and interventions, ordering and review of laboratory studies, ordering and review of radiographic studies, pulse oximetry and re-evaluation of patient's condition.  Nanda Quinton, MD Emergency Medicine  ____________________________________________   INITIAL IMPRESSION / ASSESSMENT AND PLAN / ED COURSE  Pertinent labs & imaging results that were available during my care of the patient  were reviewed by me and considered in my medical decision making (see chart for details).   Patient presents emergency department after being found unresponsive at home.  She has had upper respiratory infection symptoms and currently on Levaquin along with steroid with minimal improvement.  Found to be hypoglycemic with EMS but responded to IM glucagon.  Insulin pump removed by EMS in the setting of hypoglycemia.  Plan for sepsis work-up along with VBG, CT head. CXR with no acute process.   Labs reviewed. Lactate elevated and abx given per sepsis protocol. Suspect that episode of unresponsiveness was related to hypoglycemia. No DKA by labs. CT head negative for acute process.   Care transferred to Dr. Karle Starch pending CT imaging.   I reviewed all nursing notes, vitals, pertinent old records, EKGs, labs, imaging (as available).  ____________________________________________  FINAL CLINICAL IMPRESSION(S) / ED DIAGNOSES  Final diagnoses:  Viral URI with cough  Hypoglycemia     MEDICATIONS GIVEN DURING THIS VISIT:  Medications  sodium chloride 0.9 % bolus 500 mL (0 mLs Intravenous Stopped 05/05/20 0253)  sodium chloride 0.9 % bolus 1,000  mL (0 mLs Intravenous Stopped 05/05/20 0659)  ceFEPIme (MAXIPIME) 2 g in sodium chloride 0.9 % 100 mL IVPB (0 g Intravenous Stopped 05/05/20 0455)  metroNIDAZOLE (FLAGYL) IVPB 500 mg (0 mg Intravenous Stopped 05/05/20 0605)  vancomycin (VANCOCIN) IVPB 1000 mg/200 mL premix (0 mg Intravenous Stopped 05/05/20 0659)  iohexol (OMNIPAQUE) 300 MG/ML solution 100 mL (100 mLs Intravenous Contrast Given 05/05/20 0721)  insulin aspart (novoLOG) injection 15 Units (15 Units Subcutaneous Given 05/05/20 0932)  acetaminophen (TYLENOL) tablet 650 mg (650 mg Oral Given 05/05/20 1000)    Note:  This document was prepared using Dragon voice recognition software and may include unintentional dictation errors.  Nanda Quinton, MD, [facility] Emergency Medicine    Simcha Farrington, [facility], MD 05/10/20 1019

## 2020-05-05 NOTE — ED Notes (Signed)
Lunch Tray Ordered @ 1029. 

## 2020-05-05 NOTE — ED Notes (Signed)
Ordered Carb Modified Diet

## 2020-05-05 NOTE — Progress Notes (Signed)
Pharmacy Antibiotic Note  Ashley [name] is a 54 y.o. female admitted on 05/05/2020 with sepsisPharmacy has been consulted for vancomycin and cefepime dosing. Vancomycin 1gm and cefepime 2gm ordered in ED   Plan: Continue cefepime 2gm IV q8 hours Continue vancomycin 750 mg IV q12 hours F/u renal function, cultures and clinical course  Height: 4\' 11"  (149.9 cm) Weight: 67.6 kg (149 lb) IBW/kg (Calculated) : 43.2  Temp (24hrs), Avg:97.4 F (36.3 C), Min:97.1 F (36.2 C), Max:97.7 F (36.5 C)  Recent Labs  Lab 05/05/20 0143 05/05/20 0251 05/05/20 0409  WBC 23.9*  --   --   CREATININE  --  0.74  --   LATICACIDVEN 2.6*  --  1.0    Estimated Creatinine Clearance: 67.3 mL/min (by C-G formula based on SCr of 0.74 mg/dL).    Allergies  Allergen Reactions  . Mirtazapine Rash    And hives  . Clindamycin Hives  . Other Hives    Tylox   . Sulfonamide Derivatives Hives  . Lipitor [Atorvastatin] Other (See Comments)    MUSCLE ACHES  . Restasis [Cyclosporine]     Pt stated, "My eyes turned red on the inside and outside of eye; burning sensation"  . Topamax [Topiramate] Itching    Thank you for allowing pharmacy to be a part of this patient's care.  Beverlee Nims 05/05/2020 6:51 AM

## 2020-05-05 NOTE — ED Provider Notes (Signed)
Care of the patient assumed at the change of shift. Brought to the ED for AMS, noted to be hypoglycemic with EMS, mental status is back to baseline now. ED workup showed elevated WBC concerning for sepsis. No definite source identified, patients symptoms have been sore throat and ear pain. Had neg strep swab by PCP, given Augmentin without improvement after 5 days so switched to Levaquin also without improvement. She has also been taking steroids for the last several days which caused her blood glucose to be elevated (which subsequently made her turn her insulin pump up) and likely explains her leukocytosis.   CT CAP neg for signs of infection. Throat with erythema, induration but no exudate. Anterior cervical lymph nodes swollen.   Will add recheck strep, add mono and check full RVP to see if a source can be identified. Patient given broad spectrum Abx during the night but does not want to stay in the hospital if possible   8:49 AM Recheck glucose is now elevated. She states she would normally take 15Units of novolog for this level. Will also order he breakfast as she is hungry.   10:18 AM Monospot is neg.   2:12 PM Strep neg. There was a delay in getting RVP run as patient's Covid specimen from earlier in her visit was not in the proper transport medium to run RVP. Per [employer], results should be done in about 25 minutes.   3:08 PM RVP negative. Patient still eager to go home. Advised to watch her glucose carefully, continue with supportive care and follow up with PCP. RTED for any other concerns.    Truddie Hidden, MD 05/05/20 [phone]

## 2020-05-05 NOTE — ED Triage Notes (Signed)
Pt from home by EMS for AMS. Pt has been feeling sick and has been on antibiotics for sore throat and ear pain. Tonight, husband called EMS for pt being unresponsive. On ems arrival, pt was posturing with snoring respirations, and unequal pupils (l pupil 6, r pupil 4 both nonreactive), cbg at 47. Pt does wear an insulin pump which was removed, 1mg  glucagon given IM. After 45 minutes, pt was A&Ox3. Remains lethargic and slowly answers questions on arrival. Pt cold to touch. Also c/o cramping in legs

## 2020-05-05 NOTE — ED Notes (Signed)
CBG 64 pt given orange juice

## 2020-05-05 NOTE — ED Notes (Signed)
Date and time results received: 05/05/20  (use smartphrase ".now" to insert current time)  Test: Lactic Critical Value: 2.6  Name of Provider Notified: Long

## 2020-05-05 NOTE — ED Notes (Signed)
PT transported to CT>

## 2020-05-06 LAB — URINE CULTURE: Culture: NO GROWTH

## 2020-05-09 DIAGNOSIS — H30033 Focal chorioretinal inflammation, peripheral, bilateral: Secondary | ICD-10-CM | POA: Diagnosis not present

## 2020-05-09 DIAGNOSIS — Z79899 Other long term (current) drug therapy: Secondary | ICD-10-CM | POA: Diagnosis not present

## 2020-05-10 LAB — CULTURE, BLOOD (ROUTINE X 2)
Culture: NO GROWTH
Culture: NO GROWTH
Special Requests: ADEQUATE
Special Requests: ADEQUATE

## 2020-05-17 DIAGNOSIS — H30033 Focal chorioretinal inflammation, peripheral, bilateral: Secondary | ICD-10-CM | POA: Diagnosis not present

## 2020-05-17 DIAGNOSIS — H3581 Retinal edema: Secondary | ICD-10-CM | POA: Diagnosis not present

## 2020-05-17 DIAGNOSIS — Z79899 Other long term (current) drug therapy: Secondary | ICD-10-CM | POA: Diagnosis not present

## 2020-05-17 DIAGNOSIS — Z961 Presence of intraocular lens: Secondary | ICD-10-CM | POA: Diagnosis not present

## 2020-05-17 DIAGNOSIS — E109 Type 1 diabetes mellitus without complications: Secondary | ICD-10-CM | POA: Diagnosis not present

## 2020-05-19 DIAGNOSIS — Z794 Long term (current) use of insulin: Secondary | ICD-10-CM | POA: Diagnosis not present

## 2020-05-19 DIAGNOSIS — E1040 Type 1 diabetes mellitus with diabetic neuropathy, unspecified: Secondary | ICD-10-CM | POA: Diagnosis not present

## 2020-05-19 DIAGNOSIS — I10 Essential (primary) hypertension: Secondary | ICD-10-CM | POA: Diagnosis not present

## 2020-05-19 DIAGNOSIS — Z4681 Encounter for fitting and adjustment of insulin pump: Secondary | ICD-10-CM | POA: Diagnosis not present

## 2020-05-19 DIAGNOSIS — D72829 Elevated white blood cell count, unspecified: Secondary | ICD-10-CM | POA: Diagnosis not present

## 2020-06-20 ENCOUNTER — Inpatient Hospital Stay
Admission: EM | Admit: 2020-06-20 | Discharge: 2020-07-06 | DRG: 870 | Disposition: A | Discharge status: Home Health | Payer: Medicare HMO | Attending: Internal Medicine | Admitting: Internal Medicine

## 2020-06-20 ENCOUNTER — Other Ambulatory Visit: Payer: Self-pay

## 2020-06-20 ENCOUNTER — Emergency Department: Payer: Medicare HMO

## 2020-06-20 DIAGNOSIS — F17210 Nicotine dependence, cigarettes, uncomplicated: Secondary | ICD-10-CM | POA: Diagnosis present

## 2020-06-20 DIAGNOSIS — R451 Restlessness and agitation: Secondary | ICD-10-CM | POA: Diagnosis not present

## 2020-06-20 DIAGNOSIS — E0810 Diabetes mellitus due to underlying condition with ketoacidosis without coma: Secondary | ICD-10-CM | POA: Diagnosis not present

## 2020-06-20 DIAGNOSIS — Z881 Allergy status to other antibiotic agents status: Secondary | ICD-10-CM

## 2020-06-20 DIAGNOSIS — J9601 Acute respiratory failure with hypoxia: Secondary | ICD-10-CM

## 2020-06-20 DIAGNOSIS — E876 Hypokalemia: Secondary | ICD-10-CM | POA: Diagnosis not present

## 2020-06-20 DIAGNOSIS — Z8616 Personal history of COVID-19: Secondary | ICD-10-CM

## 2020-06-20 DIAGNOSIS — A4189 Other specified sepsis: Secondary | ICD-10-CM | POA: Diagnosis not present

## 2020-06-20 DIAGNOSIS — E669 Obesity, unspecified: Secondary | ICD-10-CM | POA: Diagnosis present

## 2020-06-20 DIAGNOSIS — J1282 Pneumonia due to coronavirus disease 2019: Secondary | ICD-10-CM | POA: Diagnosis present

## 2020-06-20 DIAGNOSIS — Z800 Family history of malignant neoplasm of digestive organs: Secondary | ICD-10-CM

## 2020-06-20 DIAGNOSIS — R609 Edema, unspecified: Secondary | ICD-10-CM | POA: Diagnosis not present

## 2020-06-20 DIAGNOSIS — K219 Gastro-esophageal reflux disease without esophagitis: Secondary | ICD-10-CM | POA: Diagnosis present

## 2020-06-20 DIAGNOSIS — R0902 Hypoxemia: Secondary | ICD-10-CM | POA: Diagnosis not present

## 2020-06-20 DIAGNOSIS — Z7902 Long term (current) use of antithrombotics/antiplatelets: Secondary | ICD-10-CM

## 2020-06-20 DIAGNOSIS — F32A Depression, unspecified: Secondary | ICD-10-CM | POA: Diagnosis present

## 2020-06-20 DIAGNOSIS — R531 Weakness: Secondary | ICD-10-CM

## 2020-06-20 DIAGNOSIS — R6521 Severe sepsis with septic shock: Secondary | ICD-10-CM | POA: Diagnosis not present

## 2020-06-20 DIAGNOSIS — G319 Degenerative disease of nervous system, unspecified: Secondary | ICD-10-CM | POA: Diagnosis not present

## 2020-06-20 DIAGNOSIS — Z95820 Peripheral vascular angioplasty status with implants and grafts: Secondary | ICD-10-CM

## 2020-06-20 DIAGNOSIS — K3184 Gastroparesis: Secondary | ICD-10-CM | POA: Diagnosis present

## 2020-06-20 DIAGNOSIS — R0689 Other abnormalities of breathing: Secondary | ICD-10-CM | POA: Diagnosis not present

## 2020-06-20 DIAGNOSIS — Z452 Encounter for adjustment and management of vascular access device: Secondary | ICD-10-CM | POA: Diagnosis not present

## 2020-06-20 DIAGNOSIS — E1165 Type 2 diabetes mellitus with hyperglycemia: Secondary | ICD-10-CM | POA: Diagnosis not present

## 2020-06-20 DIAGNOSIS — E039 Hypothyroidism, unspecified: Secondary | ICD-10-CM | POA: Diagnosis present

## 2020-06-20 DIAGNOSIS — J80 Acute respiratory distress syndrome: Secondary | ICD-10-CM | POA: Diagnosis present

## 2020-06-20 DIAGNOSIS — R918 Other nonspecific abnormal finding of lung field: Secondary | ICD-10-CM | POA: Diagnosis not present

## 2020-06-20 DIAGNOSIS — Z9989 Dependence on other enabling machines and devices: Secondary | ICD-10-CM

## 2020-06-20 DIAGNOSIS — Z79890 Hormone replacement therapy (postmenopausal): Secondary | ICD-10-CM

## 2020-06-20 DIAGNOSIS — G9349 Other encephalopathy: Secondary | ICD-10-CM | POA: Diagnosis not present

## 2020-06-20 DIAGNOSIS — J440 Chronic obstructive pulmonary disease with acute lower respiratory infection: Secondary | ICD-10-CM | POA: Diagnosis present

## 2020-06-20 DIAGNOSIS — R7401 Elevation of levels of liver transaminase levels: Secondary | ICD-10-CM | POA: Diagnosis not present

## 2020-06-20 DIAGNOSIS — Z87442 Personal history of urinary calculi: Secondary | ICD-10-CM

## 2020-06-20 DIAGNOSIS — E1143 Type 2 diabetes mellitus with diabetic autonomic (poly)neuropathy: Secondary | ICD-10-CM | POA: Diagnosis present

## 2020-06-20 DIAGNOSIS — G9340 Encephalopathy, unspecified: Secondary | ICD-10-CM | POA: Diagnosis not present

## 2020-06-20 DIAGNOSIS — L89616 Pressure-induced deep tissue damage of right heel: Secondary | ICD-10-CM

## 2020-06-20 DIAGNOSIS — R1312 Dysphagia, oropharyngeal phase: Secondary | ICD-10-CM | POA: Diagnosis present

## 2020-06-20 DIAGNOSIS — G4733 Obstructive sleep apnea (adult) (pediatric): Secondary | ICD-10-CM

## 2020-06-20 DIAGNOSIS — I2699 Other pulmonary embolism without acute cor pulmonale: Secondary | ICD-10-CM | POA: Diagnosis not present

## 2020-06-20 DIAGNOSIS — Z6829 Body mass index (BMI) 29.0-29.9, adult: Secondary | ICD-10-CM

## 2020-06-20 DIAGNOSIS — R0602 Shortness of breath: Secondary | ICD-10-CM | POA: Diagnosis not present

## 2020-06-20 DIAGNOSIS — N179 Acute kidney failure, unspecified: Secondary | ICD-10-CM | POA: Diagnosis present

## 2020-06-20 DIAGNOSIS — E785 Hyperlipidemia, unspecified: Secondary | ICD-10-CM | POA: Diagnosis present

## 2020-06-20 DIAGNOSIS — I10 Essential (primary) hypertension: Secondary | ICD-10-CM | POA: Diagnosis not present

## 2020-06-20 DIAGNOSIS — J4540 Moderate persistent asthma, uncomplicated: Secondary | ICD-10-CM

## 2020-06-20 DIAGNOSIS — I739 Peripheral vascular disease, unspecified: Secondary | ICD-10-CM | POA: Diagnosis not present

## 2020-06-20 DIAGNOSIS — E1142 Type 2 diabetes mellitus with diabetic polyneuropathy: Secondary | ICD-10-CM | POA: Diagnosis present

## 2020-06-20 DIAGNOSIS — G9341 Metabolic encephalopathy: Secondary | ICD-10-CM | POA: Diagnosis present

## 2020-06-20 DIAGNOSIS — Z7983 Long term (current) use of bisphosphonates: Secondary | ICD-10-CM

## 2020-06-20 DIAGNOSIS — R509 Fever, unspecified: Secondary | ICD-10-CM

## 2020-06-20 DIAGNOSIS — J90 Pleural effusion, not elsewhere classified: Secondary | ICD-10-CM | POA: Diagnosis not present

## 2020-06-20 DIAGNOSIS — R778 Other specified abnormalities of plasma proteins: Secondary | ICD-10-CM | POA: Diagnosis present

## 2020-06-20 DIAGNOSIS — A419 Sepsis, unspecified organism: Secondary | ICD-10-CM

## 2020-06-20 DIAGNOSIS — J9690 Respiratory failure, unspecified, unspecified whether with hypoxia or hypercapnia: Secondary | ICD-10-CM | POA: Diagnosis not present

## 2020-06-20 DIAGNOSIS — H409 Unspecified glaucoma: Secondary | ICD-10-CM | POA: Diagnosis present

## 2020-06-20 DIAGNOSIS — M069 Rheumatoid arthritis, unspecified: Secondary | ICD-10-CM | POA: Diagnosis present

## 2020-06-20 DIAGNOSIS — Z801 Family history of malignant neoplasm of trachea, bronchus and lung: Secondary | ICD-10-CM

## 2020-06-20 DIAGNOSIS — Z882 Allergy status to sulfonamides status: Secondary | ICD-10-CM

## 2020-06-20 DIAGNOSIS — E1110 Type 2 diabetes mellitus with ketoacidosis without coma: Secondary | ICD-10-CM | POA: Diagnosis present

## 2020-06-20 DIAGNOSIS — R000 Tachycardia, unspecified: Secondary | ICD-10-CM | POA: Diagnosis not present

## 2020-06-20 DIAGNOSIS — Z978 Presence of other specified devices: Secondary | ICD-10-CM | POA: Diagnosis not present

## 2020-06-20 DIAGNOSIS — Z888 Allergy status to other drugs, medicaments and biological substances status: Secondary | ICD-10-CM

## 2020-06-20 DIAGNOSIS — E1151 Type 2 diabetes mellitus with diabetic peripheral angiopathy without gangrene: Secondary | ICD-10-CM | POA: Diagnosis present

## 2020-06-20 DIAGNOSIS — U071 COVID-19: Secondary | ICD-10-CM | POA: Diagnosis present

## 2020-06-20 DIAGNOSIS — E0811 Diabetes mellitus due to underlying condition with ketoacidosis with coma: Secondary | ICD-10-CM | POA: Diagnosis not present

## 2020-06-20 DIAGNOSIS — J189 Pneumonia, unspecified organism: Secondary | ICD-10-CM | POA: Diagnosis not present

## 2020-06-20 DIAGNOSIS — R4182 Altered mental status, unspecified: Secondary | ICD-10-CM | POA: Diagnosis not present

## 2020-06-20 DIAGNOSIS — Z90710 Acquired absence of both cervix and uterus: Secondary | ICD-10-CM

## 2020-06-20 DIAGNOSIS — E860 Dehydration: Secondary | ICD-10-CM | POA: Diagnosis present

## 2020-06-20 DIAGNOSIS — M810 Age-related osteoporosis without current pathological fracture: Secondary | ICD-10-CM | POA: Diagnosis present

## 2020-06-20 DIAGNOSIS — R0603 Acute respiratory distress: Secondary | ICD-10-CM | POA: Diagnosis not present

## 2020-06-20 DIAGNOSIS — Z9641 Presence of insulin pump (external) (internal): Secondary | ICD-10-CM | POA: Diagnosis present

## 2020-06-20 DIAGNOSIS — Z0189 Encounter for other specified special examinations: Secondary | ICD-10-CM

## 2020-06-20 DIAGNOSIS — Z8249 Family history of ischemic heart disease and other diseases of the circulatory system: Secondary | ICD-10-CM

## 2020-06-20 DIAGNOSIS — Z833 Family history of diabetes mellitus: Secondary | ICD-10-CM

## 2020-06-20 DIAGNOSIS — Z794 Long term (current) use of insulin: Secondary | ICD-10-CM

## 2020-06-20 DIAGNOSIS — J441 Chronic obstructive pulmonary disease with (acute) exacerbation: Secondary | ICD-10-CM | POA: Diagnosis present

## 2020-06-20 DIAGNOSIS — E559 Vitamin D deficiency, unspecified: Secondary | ICD-10-CM | POA: Diagnosis present

## 2020-06-20 DIAGNOSIS — E870 Hyperosmolality and hypernatremia: Secondary | ICD-10-CM | POA: Diagnosis not present

## 2020-06-20 DIAGNOSIS — Z4682 Encounter for fitting and adjustment of non-vascular catheter: Secondary | ICD-10-CM | POA: Diagnosis not present

## 2020-06-20 DIAGNOSIS — F10231 Alcohol dependence with withdrawal delirium: Secondary | ICD-10-CM | POA: Diagnosis not present

## 2020-06-20 DIAGNOSIS — Z79899 Other long term (current) drug therapy: Secondary | ICD-10-CM

## 2020-06-20 DIAGNOSIS — Z7982 Long term (current) use of aspirin: Secondary | ICD-10-CM

## 2020-06-20 HISTORY — DX: Personal history of COVID-19: Z86.16

## 2020-06-20 LAB — CBC WITH DIFFERENTIAL/PLATELET
Abs Immature Granulocytes: 0.02 10*3/uL (ref 0.00–0.07)
Basophils Absolute: 0 10*3/uL (ref 0.0–0.1)
Basophils Relative: 0 %
Eosinophils Absolute: 0 10*3/uL (ref 0.0–0.5)
Eosinophils Relative: 0 %
HCT: 38.6 % (ref 36.0–46.0)
Hemoglobin: 13.1 g/dL (ref 12.0–15.0)
Immature Granulocytes: 1 %
Lymphocytes Relative: 11 %
Lymphs Abs: 0.5 10*3/uL — ABNORMAL LOW (ref 0.7–4.0)
MCH: 30.4 pg (ref 26.0–34.0)
MCHC: 33.9 g/dL (ref 30.0–36.0)
MCV: 89.6 fL (ref 80.0–100.0)
Monocytes Absolute: 0.2 10*3/uL (ref 0.1–1.0)
Monocytes Relative: 4 %
Neutro Abs: 3.7 10*3/uL (ref 1.7–7.7)
Neutrophils Relative %: 84 %
Platelets: 173 10*3/uL (ref 150–400)
RBC: 4.31 MIL/uL (ref 3.87–5.11)
RDW: 14.4 % (ref 11.5–15.5)
WBC: 4.3 10*3/uL (ref 4.0–10.5)
nRBC: 0 % (ref 0.0–0.2)

## 2020-06-20 LAB — BETA-HYDROXYBUTYRIC ACID: Beta-Hydroxybutyric Acid: 3.99 mmol/L — ABNORMAL HIGH (ref 0.05–0.27)

## 2020-06-20 LAB — COMPREHENSIVE METABOLIC PANEL
ALT: 44 U/L (ref 0–44)
AST: 75 U/L — ABNORMAL HIGH (ref 15–41)
Albumin: 4.3 g/dL (ref 3.5–5.0)
Alkaline Phosphatase: 59 U/L (ref 38–126)
Anion gap: 20 — ABNORMAL HIGH (ref 5–15)
BUN: 22 mg/dL — ABNORMAL HIGH (ref 6–20)
CO2: 19 mmol/L — ABNORMAL LOW (ref 22–32)
Calcium: 8.9 mg/dL (ref 8.9–10.3)
Chloride: 92 mmol/L — ABNORMAL LOW (ref 98–111)
Creatinine, Ser: 1.12 mg/dL — ABNORMAL HIGH (ref 0.44–1.00)
GFR, Estimated: 58 mL/min — ABNORMAL LOW (ref >60)
Glucose, Bld: 290 mg/dL — ABNORMAL HIGH (ref 70–99)
Potassium: 3.4 mmol/L — ABNORMAL LOW (ref 3.5–5.1)
Sodium: 131 mmol/L — ABNORMAL LOW (ref 135–145)
Total Bilirubin: 1.4 mg/dL — ABNORMAL HIGH (ref 0.3–1.2)
Total Protein: 7.7 g/dL (ref 6.5–8.1)

## 2020-06-20 LAB — LACTIC ACID, PLASMA: Lactic Acid, Venous: 1.1 mmol/L (ref 0.5–1.9)

## 2020-06-20 MED ORDER — LACTATED RINGERS IV BOLUS
1000.0000 mL | Freq: Once | INTRAVENOUS | Status: AC
  Administered 2020-06-21: 1000 mL via INTRAVENOUS

## 2020-06-20 MED ORDER — SODIUM CHLORIDE 0.9 % IV BOLUS
1000.0000 mL | Freq: Once | INTRAVENOUS | Status: AC
  Administered 2020-06-20: 1000 mL via INTRAVENOUS

## 2020-06-20 MED ORDER — DEXTROSE IN LACTATED RINGERS 5 % IV SOLN: INTRAVENOUS | Status: DC

## 2020-06-20 MED ORDER — DEXTROSE 50 % IV SOLN
0.0000 mL | INTRAVENOUS | Status: DC | PRN
  Filled 2020-06-20: qty 50

## 2020-06-20 MED ORDER — INSULIN REGULAR(HUMAN) IN NACL 100-0.9 UT/100ML-% IV SOLN
INTRAVENOUS | Status: DC
  Filled 2020-06-20: qty 100

## 2020-06-20 MED ORDER — POTASSIUM CHLORIDE 10 MEQ/100ML IV SOLN
10.0000 meq | INTRAVENOUS | Status: AC
  Administered 2020-06-21: 10 meq via INTRAVENOUS
  Filled 2020-06-20 (×3): qty 100

## 2020-06-20 MED ORDER — SODIUM CHLORIDE 0.9 % IV SOLN: 100.0000 mg | Freq: Every day | INTRAVENOUS | Status: DC

## 2020-06-20 MED ORDER — LACTATED RINGERS IV SOLN: INTRAVENOUS | Status: DC

## 2020-06-20 MED ORDER — SODIUM CHLORIDE 0.9 % IV SOLN: 200.0000 mg | Freq: Once | INTRAVENOUS | Status: DC

## 2020-06-20 NOTE — ED Triage Notes (Signed)
PT to ER via EMS from home.  Pt here with husband.  Pt states Covid positive 1-2 weeks ago.  When asked why she is here today pt answers "same old, same old".  Pt moaning constantly and answers questions in one word answers.  Pt denies pain.

## 2020-06-20 NOTE — ED Triage Notes (Signed)
First Nurse Note: Arrives via [facility] Covid + x 2 weeks.  C/O fever and body aches.  CBG:  295.  HR 120-130's

## 2020-06-21 ENCOUNTER — Encounter: Payer: Self-pay | Admitting: Internal Medicine

## 2020-06-21 ENCOUNTER — Inpatient Hospital Stay: Payer: Medicare HMO

## 2020-06-21 ENCOUNTER — Other Ambulatory Visit: Payer: Self-pay

## 2020-06-21 DIAGNOSIS — G9341 Metabolic encephalopathy: Secondary | ICD-10-CM | POA: Diagnosis present

## 2020-06-21 DIAGNOSIS — G4733 Obstructive sleep apnea (adult) (pediatric): Secondary | ICD-10-CM | POA: Diagnosis present

## 2020-06-21 DIAGNOSIS — N179 Acute kidney failure, unspecified: Secondary | ICD-10-CM | POA: Diagnosis present

## 2020-06-21 DIAGNOSIS — E039 Hypothyroidism, unspecified: Secondary | ICD-10-CM | POA: Diagnosis present

## 2020-06-21 DIAGNOSIS — E870 Hyperosmolality and hypernatremia: Secondary | ICD-10-CM | POA: Diagnosis not present

## 2020-06-21 DIAGNOSIS — I739 Peripheral vascular disease, unspecified: Secondary | ICD-10-CM | POA: Diagnosis not present

## 2020-06-21 DIAGNOSIS — E1110 Type 2 diabetes mellitus with ketoacidosis without coma: Secondary | ICD-10-CM | POA: Diagnosis not present

## 2020-06-21 DIAGNOSIS — I10 Essential (primary) hypertension: Secondary | ICD-10-CM | POA: Diagnosis not present

## 2020-06-21 DIAGNOSIS — R0603 Acute respiratory distress: Secondary | ICD-10-CM | POA: Diagnosis not present

## 2020-06-21 DIAGNOSIS — E1142 Type 2 diabetes mellitus with diabetic polyneuropathy: Secondary | ICD-10-CM | POA: Diagnosis present

## 2020-06-21 DIAGNOSIS — L89616 Pressure-induced deep tissue damage of right heel: Secondary | ICD-10-CM | POA: Diagnosis not present

## 2020-06-21 DIAGNOSIS — E785 Hyperlipidemia, unspecified: Secondary | ICD-10-CM | POA: Diagnosis present

## 2020-06-21 DIAGNOSIS — J440 Chronic obstructive pulmonary disease with acute lower respiratory infection: Secondary | ICD-10-CM | POA: Diagnosis present

## 2020-06-21 DIAGNOSIS — U071 COVID-19: Secondary | ICD-10-CM

## 2020-06-21 DIAGNOSIS — A4189 Other specified sepsis: Secondary | ICD-10-CM | POA: Diagnosis not present

## 2020-06-21 DIAGNOSIS — J9601 Acute respiratory failure with hypoxia: Secondary | ICD-10-CM | POA: Diagnosis not present

## 2020-06-21 DIAGNOSIS — F17210 Nicotine dependence, cigarettes, uncomplicated: Secondary | ICD-10-CM | POA: Diagnosis present

## 2020-06-21 DIAGNOSIS — Z978 Presence of other specified devices: Secondary | ICD-10-CM | POA: Diagnosis not present

## 2020-06-21 DIAGNOSIS — E0810 Diabetes mellitus due to underlying condition with ketoacidosis without coma: Secondary | ICD-10-CM | POA: Diagnosis not present

## 2020-06-21 DIAGNOSIS — E1151 Type 2 diabetes mellitus with diabetic peripheral angiopathy without gangrene: Secondary | ICD-10-CM | POA: Diagnosis present

## 2020-06-21 DIAGNOSIS — M069 Rheumatoid arthritis, unspecified: Secondary | ICD-10-CM | POA: Diagnosis present

## 2020-06-21 DIAGNOSIS — H409 Unspecified glaucoma: Secondary | ICD-10-CM | POA: Diagnosis present

## 2020-06-21 DIAGNOSIS — J80 Acute respiratory distress syndrome: Secondary | ICD-10-CM | POA: Diagnosis present

## 2020-06-21 DIAGNOSIS — J189 Pneumonia, unspecified organism: Secondary | ICD-10-CM | POA: Diagnosis not present

## 2020-06-21 DIAGNOSIS — R4182 Altered mental status, unspecified: Secondary | ICD-10-CM | POA: Diagnosis not present

## 2020-06-21 DIAGNOSIS — G319 Degenerative disease of nervous system, unspecified: Secondary | ICD-10-CM | POA: Diagnosis not present

## 2020-06-21 DIAGNOSIS — J9690 Respiratory failure, unspecified, unspecified whether with hypoxia or hypercapnia: Secondary | ICD-10-CM | POA: Diagnosis not present

## 2020-06-21 DIAGNOSIS — R609 Edema, unspecified: Secondary | ICD-10-CM | POA: Diagnosis not present

## 2020-06-21 DIAGNOSIS — E0811 Diabetes mellitus due to underlying condition with ketoacidosis with coma: Secondary | ICD-10-CM | POA: Diagnosis not present

## 2020-06-21 DIAGNOSIS — E1143 Type 2 diabetes mellitus with diabetic autonomic (poly)neuropathy: Secondary | ICD-10-CM | POA: Diagnosis present

## 2020-06-21 DIAGNOSIS — Z4682 Encounter for fitting and adjustment of non-vascular catheter: Secondary | ICD-10-CM | POA: Diagnosis not present

## 2020-06-21 DIAGNOSIS — R6521 Severe sepsis with septic shock: Secondary | ICD-10-CM | POA: Diagnosis not present

## 2020-06-21 DIAGNOSIS — G9349 Other encephalopathy: Secondary | ICD-10-CM | POA: Diagnosis not present

## 2020-06-21 DIAGNOSIS — R918 Other nonspecific abnormal finding of lung field: Secondary | ICD-10-CM | POA: Diagnosis not present

## 2020-06-21 DIAGNOSIS — F10231 Alcohol dependence with withdrawal delirium: Secondary | ICD-10-CM | POA: Diagnosis not present

## 2020-06-21 DIAGNOSIS — K219 Gastro-esophageal reflux disease without esophagitis: Secondary | ICD-10-CM | POA: Diagnosis present

## 2020-06-21 DIAGNOSIS — J441 Chronic obstructive pulmonary disease with (acute) exacerbation: Secondary | ICD-10-CM | POA: Diagnosis present

## 2020-06-21 DIAGNOSIS — I2699 Other pulmonary embolism without acute cor pulmonale: Secondary | ICD-10-CM | POA: Diagnosis not present

## 2020-06-21 DIAGNOSIS — R531 Weakness: Secondary | ICD-10-CM | POA: Diagnosis not present

## 2020-06-21 DIAGNOSIS — R451 Restlessness and agitation: Secondary | ICD-10-CM | POA: Diagnosis not present

## 2020-06-21 DIAGNOSIS — J1282 Pneumonia due to coronavirus disease 2019: Secondary | ICD-10-CM | POA: Diagnosis present

## 2020-06-21 DIAGNOSIS — K3184 Gastroparesis: Secondary | ICD-10-CM | POA: Diagnosis not present

## 2020-06-21 DIAGNOSIS — Z452 Encounter for adjustment and management of vascular access device: Secondary | ICD-10-CM | POA: Diagnosis not present

## 2020-06-21 DIAGNOSIS — R0902 Hypoxemia: Secondary | ICD-10-CM | POA: Diagnosis not present

## 2020-06-21 DIAGNOSIS — G9340 Encephalopathy, unspecified: Secondary | ICD-10-CM | POA: Diagnosis not present

## 2020-06-21 DIAGNOSIS — J90 Pleural effusion, not elsewhere classified: Secondary | ICD-10-CM | POA: Diagnosis not present

## 2020-06-21 DIAGNOSIS — J4540 Moderate persistent asthma, uncomplicated: Secondary | ICD-10-CM | POA: Diagnosis present

## 2020-06-21 LAB — CBC WITH DIFFERENTIAL/PLATELET
Abs Immature Granulocytes: 0.07 10*3/uL (ref 0.00–0.07)
Basophils Absolute: 0 10*3/uL (ref 0.0–0.1)
Basophils Relative: 0 %
Eosinophils Absolute: 0 10*3/uL (ref 0.0–0.5)
Eosinophils Relative: 0 %
HCT: 30.7 % — ABNORMAL LOW (ref 36.0–46.0)
Hemoglobin: 10.6 g/dL — ABNORMAL LOW (ref 12.0–15.0)
Immature Granulocytes: 2 %
Lymphocytes Relative: 15 %
Lymphs Abs: 0.7 10*3/uL (ref 0.7–4.0)
MCH: 31.1 pg (ref 26.0–34.0)
MCHC: 34.5 g/dL (ref 30.0–36.0)
MCV: 90 fL (ref 80.0–100.0)
Monocytes Absolute: 0.3 10*3/uL (ref 0.1–1.0)
Monocytes Relative: 7 %
Neutro Abs: 3.6 10*3/uL (ref 1.7–7.7)
Neutrophils Relative %: 76 %
Platelets: 132 10*3/uL — ABNORMAL LOW (ref 150–400)
RBC: 3.41 MIL/uL — ABNORMAL LOW (ref 3.87–5.11)
RDW: 14.6 % (ref 11.5–15.5)
Smear Review: NORMAL
WBC: 4.8 10*3/uL (ref 4.0–10.5)
nRBC: 0 % (ref 0.0–0.2)

## 2020-06-21 LAB — BLOOD GAS, ARTERIAL
Acid-Base Excess: 3.3 mmol/L — ABNORMAL HIGH (ref 0.0–2.0)
Acid-base deficit: 6.4 mmol/L — ABNORMAL HIGH (ref 0.0–2.0)
Bicarbonate: 13.9 mmol/L — ABNORMAL LOW (ref 20.0–28.0)
Bicarbonate: 24.7 mmol/L (ref 20.0–28.0)
FIO2: 0.21
FIO2: 0.4
O2 Saturation: 94.4 %
O2 Saturation: 94.4 %
Patient temperature: 37
Patient temperature: 39.2
pCO2 arterial: <19 mmHg — CL (ref 32.0–48.0)
pCO2 arterial: 30 mmHg — ABNORMAL LOW (ref 32.0–48.0)
pH, Arterial: 7.52 — ABNORMAL HIGH (ref 7.350–7.450)
pH, Arterial: 7.54 — ABNORMAL HIGH (ref 7.350–7.450)
pO2, Arterial: 64 mmHg — ABNORMAL LOW (ref 83.0–108.0)
pO2, Arterial: 71 mmHg — ABNORMAL LOW (ref 83.0–108.0)

## 2020-06-21 LAB — BASIC METABOLIC PANEL
Anion gap: 15 (ref 5–15)
Anion gap: 12 (ref 5–15)
BUN: 16 mg/dL (ref 6–20)
BUN: 16 mg/dL (ref 6–20)
CO2: 20 mmol/L — ABNORMAL LOW (ref 22–32)
CO2: 23 mmol/L (ref 22–32)
Calcium: 8 mg/dL — ABNORMAL LOW (ref 8.9–10.3)
Calcium: 8.1 mg/dL — ABNORMAL LOW (ref 8.9–10.3)
Chloride: 102 mmol/L (ref 98–111)
Chloride: 105 mmol/L (ref 98–111)
Creatinine, Ser: 0.99 mg/dL (ref 0.44–1.00)
Creatinine, Ser: 0.94 mg/dL (ref 0.44–1.00)
GFR, Estimated: >60 mL/min (ref >60)
GFR, Estimated: >60 mL/min (ref >60)
Glucose, Bld: 268 mg/dL — ABNORMAL HIGH (ref 70–99)
Glucose, Bld: 192 mg/dL — ABNORMAL HIGH (ref 70–99)
Potassium: 3.5 mmol/L (ref 3.5–5.1)
Potassium: 3.5 mmol/L (ref 3.5–5.1)
Sodium: 137 mmol/L (ref 135–145)
Sodium: 140 mmol/L (ref 135–145)

## 2020-06-21 LAB — URINALYSIS, COMPLETE (UACMP) WITH MICROSCOPIC
Bacteria, UA: NONE SEEN
Bilirubin Urine: NEGATIVE
Glucose, UA: >=500 mg/dL — AB
Ketones, ur: 80 mg/dL — AB
Leukocytes,Ua: NEGATIVE
Nitrite: NEGATIVE
Protein, ur: 100 mg/dL — AB
Specific Gravity, Urine: 1.017 (ref 1.005–1.030)
pH: 5 (ref 5.0–8.0)

## 2020-06-21 LAB — CBG MONITORING, ED
Glucose-Capillary: 407 mg/dL — ABNORMAL HIGH (ref 70–99)
Glucose-Capillary: 394 mg/dL — ABNORMAL HIGH (ref 70–99)
Glucose-Capillary: 415 mg/dL — ABNORMAL HIGH (ref 70–99)
Glucose-Capillary: 270 mg/dL — ABNORMAL HIGH (ref 70–99)
Glucose-Capillary: 330 mg/dL — ABNORMAL HIGH (ref 70–99)
Glucose-Capillary: 192 mg/dL — ABNORMAL HIGH (ref 70–99)
Glucose-Capillary: 246 mg/dL — ABNORMAL HIGH (ref 70–99)
Glucose-Capillary: 287 mg/dL — ABNORMAL HIGH (ref 70–99)
Glucose-Capillary: 169 mg/dL — ABNORMAL HIGH (ref 70–99)
Glucose-Capillary: 266 mg/dL — ABNORMAL HIGH (ref 70–99)
Glucose-Capillary: 186 mg/dL — ABNORMAL HIGH (ref 70–99)
Glucose-Capillary: 184 mg/dL — ABNORMAL HIGH (ref 70–99)
Glucose-Capillary: 181 mg/dL — ABNORMAL HIGH (ref 70–99)
Glucose-Capillary: 153 mg/dL — ABNORMAL HIGH (ref 70–99)
Glucose-Capillary: 138 mg/dL — ABNORMAL HIGH (ref 70–99)
Glucose-Capillary: 228 mg/dL — ABNORMAL HIGH (ref 70–99)

## 2020-06-21 LAB — LACTATE DEHYDROGENASE: LDH: 453 U/L — ABNORMAL HIGH (ref 98–192)

## 2020-06-21 LAB — CREATININE, SERUM
Creatinine, Ser: 1.15 mg/dL — ABNORMAL HIGH (ref 0.44–1.00)
GFR, Estimated: 57 mL/min — ABNORMAL LOW (ref >60)

## 2020-06-21 LAB — CBC
MCV: 91.6 fL (ref 80.0–100.0)
RDW: 14.7 % (ref 11.5–15.5)
nRBC: 0.4 % — ABNORMAL HIGH (ref 0.0–0.2)

## 2020-06-21 LAB — ABO/RH: ABO/RH(D): A NEG

## 2020-06-21 LAB — HEMOGLOBIN A1C
Hgb A1c MFr Bld: 8.2 % — ABNORMAL HIGH (ref 4.8–5.6)
Mean Plasma Glucose: 188.64 mg/dL

## 2020-06-21 LAB — RESP PANEL BY RT-PCR (FLU A&B, COVID) ARPGX2
Influenza A by PCR: NEGATIVE
Influenza B by PCR: NEGATIVE
SARS Coronavirus 2 by RT PCR: POSITIVE — AB

## 2020-06-21 LAB — C-REACTIVE PROTEIN: CRP: 6.9 mg/dL — ABNORMAL HIGH (ref <1.0)

## 2020-06-21 LAB — TROPONIN I (HIGH SENSITIVITY): Troponin I (High Sensitivity): 76 ng/L — ABNORMAL HIGH (ref <18)

## 2020-06-21 LAB — FERRITIN: Ferritin: 877 ng/mL — ABNORMAL HIGH (ref 11–307)

## 2020-06-21 LAB — BRAIN NATRIURETIC PEPTIDE: B Natriuretic Peptide: 128.8 pg/mL — ABNORMAL HIGH (ref 0.0–100.0)

## 2020-06-21 LAB — HIV ANTIBODY (ROUTINE TESTING W REFLEX): HIV Screen 4th Generation wRfx: NONREACTIVE

## 2020-06-21 LAB — FIBRINOGEN: Fibrinogen: 620 mg/dL — ABNORMAL HIGH (ref 210–475)

## 2020-06-21 LAB — PROCALCITONIN: Procalcitonin: 1.43 ng/mL

## 2020-06-21 LAB — LACTIC ACID, PLASMA
Lactic Acid, Venous: 2.2 mmol/L (ref 0.5–1.9)
Lactic Acid, Venous: 2.6 mmol/L (ref 0.5–1.9)

## 2020-06-21 LAB — D-DIMER, QUANTITATIVE: D-Dimer, Quant: 1.32 ug/mL-FEU — ABNORMAL HIGH (ref 0.00–0.50)

## 2020-06-21 LAB — HEPATITIS B SURFACE ANTIGEN: Hepatitis B Surface Ag: NONREACTIVE

## 2020-06-21 MED ORDER — HALOPERIDOL 2 MG PO TABS
2.0000 mg | ORAL_TABLET | Freq: Four times a day (QID) | ORAL | Status: DC | PRN
  Administered 2020-06-21: 2 mg via ORAL
  Filled 2020-06-21 (×2): qty 1

## 2020-06-21 MED ORDER — SODIUM CHLORIDE 0.9 % IV SOLN
100.0000 mg | Freq: Every day | INTRAVENOUS | Status: AC
  Administered 2020-06-21 – 2020-06-24 (×4): 100 mg via INTRAVENOUS
  Filled 2020-06-21: qty 100
  Filled 2020-06-21 (×3): qty 20

## 2020-06-21 MED ORDER — GUAIFENESIN-DM 100-10 MG/5ML PO SYRP: 10.0000 mL | ORAL_SOLUTION | ORAL | Status: DC | PRN

## 2020-06-21 MED ORDER — VANCOMYCIN HCL IN DEXTROSE 1-5 GM/200ML-% IV SOLN
1000.0000 mg | Freq: Once | INTRAVENOUS | Status: AC
  Administered 2020-06-21: 1000 mg via INTRAVENOUS
  Filled 2020-06-21: qty 200

## 2020-06-21 MED ORDER — POTASSIUM CHLORIDE 10 MEQ/100ML IV SOLN
10.0000 meq | INTRAVENOUS | Status: AC
  Administered 2020-06-21 (×3): 10 meq via INTRAVENOUS
  Filled 2020-06-21: qty 100

## 2020-06-21 MED ORDER — MORPHINE SULFATE (PF) 2 MG/ML IV SOLN
INTRAVENOUS | Status: AC
  Administered 2020-06-21: 2 mg via INTRAVENOUS
  Filled 2020-06-21: qty 1

## 2020-06-21 MED ORDER — LORAZEPAM 2 MG/ML IJ SOLN
0.5000 mg | Freq: Once | INTRAMUSCULAR | Status: AC
  Administered 2020-06-21: 0.5 mg via INTRAVENOUS
  Filled 2020-06-21: qty 1

## 2020-06-21 MED ORDER — INSULIN REGULAR(HUMAN) IN NACL 100-0.9 UT/100ML-% IV SOLN
INTRAVENOUS | Status: DC
  Administered 2020-06-21: 8 [IU]/h via INTRAVENOUS

## 2020-06-21 MED ORDER — ACETAMINOPHEN 325 MG PO TABS
650.0000 mg | ORAL_TABLET | Freq: Four times a day (QID) | ORAL | Status: DC | PRN
  Administered 2020-06-22: 650 mg via ORAL
  Filled 2020-06-21: qty 2

## 2020-06-21 MED ORDER — KETOROLAC TROMETHAMINE 15 MG/ML IJ SOLN
15.0000 mg | Freq: Four times a day (QID) | INTRAMUSCULAR | Status: DC | PRN
  Administered 2020-06-21: 15 mg via INTRAVENOUS
  Filled 2020-06-21 (×3): qty 1

## 2020-06-21 MED ORDER — MORPHINE SULFATE (PF) 2 MG/ML IV SOLN
2.0000 mg | INTRAVENOUS | Status: DC | PRN
  Administered 2020-06-21 – 2020-06-23 (×2): 2 mg via INTRAVENOUS
  Filled 2020-06-21 (×2): qty 1

## 2020-06-21 MED ORDER — SODIUM CHLORIDE 0.9 % IV SOLN: 100.0000 mg | Freq: Every day | INTRAVENOUS | Status: DC

## 2020-06-21 MED ORDER — LACTATED RINGERS IV BOLUS
1000.0000 mL | Freq: Once | INTRAVENOUS | Status: AC
  Administered 2020-06-21: 1000 mL via INTRAVENOUS

## 2020-06-21 MED ORDER — SODIUM CHLORIDE 0.9 % IV SOLN: 500.0000 mg | INTRAVENOUS | Status: DC

## 2020-06-21 MED ORDER — ZINC SULFATE 220 (50 ZN) MG PO CAPS
220.0000 mg | ORAL_CAPSULE | Freq: Every day | ORAL | Status: DC
  Administered 2020-06-22 – 2020-06-24 (×2): 220 mg via ORAL
  Filled 2020-06-21 (×4): qty 1

## 2020-06-21 MED ORDER — SODIUM CHLORIDE 0.9 % IV SOLN: 200.0000 mg | Freq: Once | INTRAVENOUS | Status: DC

## 2020-06-21 MED ORDER — GADOBUTROL 1 MMOL/ML IV SOLN
6.0000 mL | Freq: Once | INTRAVENOUS | Status: AC | PRN
  Administered 2020-06-21: 6 mL via INTRAVENOUS

## 2020-06-21 MED ORDER — ENOXAPARIN SODIUM 40 MG/0.4ML ~~LOC~~ SOLN
40.0000 mg | SUBCUTANEOUS | Status: DC
  Administered 2020-06-21 – 2020-06-24 (×4): 40 mg via SUBCUTANEOUS
  Filled 2020-06-21 (×4): qty 0.4

## 2020-06-21 MED ORDER — ACETAMINOPHEN 650 MG RE SUPP
650.0000 mg | Freq: Four times a day (QID) | RECTAL | Status: DC | PRN
  Administered 2020-06-21 – 2020-06-23 (×4): 650 mg via RECTAL
  Filled 2020-06-21 (×4): qty 1

## 2020-06-21 MED ORDER — SODIUM CHLORIDE 0.9 % IV SOLN: 2.0000 g | INTRAVENOUS | Status: DC

## 2020-06-21 MED ORDER — ASCORBIC ACID 500 MG PO TABS
500.0000 mg | ORAL_TABLET | Freq: Every day | ORAL | Status: DC
  Administered 2020-06-22 – 2020-06-24 (×2): 500 mg via ORAL
  Filled 2020-06-21 (×4): qty 1

## 2020-06-21 MED ORDER — PIPERACILLIN-TAZOBACTAM 3.375 G IVPB 30 MIN
3.3750 g | Freq: Once | INTRAVENOUS | Status: AC
  Administered 2020-06-21: 3.375 g via INTRAVENOUS
  Filled 2020-06-21: qty 50

## 2020-06-21 MED ORDER — CEFTRIAXONE SODIUM 2 G IJ SOLR: 2.0000 g | INTRAMUSCULAR | Status: DC

## 2020-06-21 MED ORDER — HALOPERIDOL LACTATE 5 MG/ML IJ SOLN
2.0000 mg | Freq: Four times a day (QID) | INTRAMUSCULAR | Status: DC | PRN
  Filled 2020-06-21: qty 1

## 2020-06-21 MED ORDER — ACETAMINOPHEN 650 MG RE SUPP
975.0000 mg | Freq: Once | RECTAL | Status: AC
  Administered 2020-06-21: 975 mg via RECTAL
  Filled 2020-06-21: qty 1

## 2020-06-21 MED ORDER — VANCOMYCIN HCL 500 MG/100ML IV SOLN
500.0000 mg | Freq: Once | INTRAVENOUS | Status: AC
  Administered 2020-06-21: 500 mg via INTRAVENOUS
  Filled 2020-06-21: qty 100

## 2020-06-21 MED ORDER — INSULIN GLARGINE 100 UNIT/ML ~~LOC~~ SOLN
20.0000 [IU] | SUBCUTANEOUS | Status: DC
  Administered 2020-06-21: 20 [IU] via SUBCUTANEOUS
  Filled 2020-06-21: qty 0.2

## 2020-06-21 MED ORDER — SODIUM CHLORIDE 0.9 % IV SOLN
2.0000 g | Freq: Two times a day (BID) | INTRAVENOUS | Status: DC
  Administered 2020-06-21 – 2020-06-22 (×3): 2 g via INTRAVENOUS
  Filled 2020-06-21 (×4): qty 2

## 2020-06-21 MED ORDER — VANCOMYCIN HCL IN DEXTROSE 1-5 GM/200ML-% IV SOLN
1000.0000 mg | INTRAVENOUS | Status: DC
  Administered 2020-06-22: 1000 mg via INTRAVENOUS
  Filled 2020-06-21: qty 200

## 2020-06-21 MED ORDER — SODIUM CHLORIDE 0.9 % IV SOLN
200.0000 mg | Freq: Once | INTRAVENOUS | Status: AC
  Administered 2020-06-21: 200 mg via INTRAVENOUS
  Filled 2020-06-21: qty 200

## 2020-06-21 MED ORDER — ALPRAZOLAM 0.25 MG PO TABS: 0.2500 mg | ORAL_TABLET | Freq: Three times a day (TID) | ORAL | Status: DC | PRN

## 2020-06-21 MED ORDER — VANCOMYCIN HCL 1500 MG/300ML IV SOLN
1500.0000 mg | Freq: Once | INTRAVENOUS | Status: DC
  Filled 2020-06-21: qty 300

## 2020-06-21 MED ORDER — HALOPERIDOL LACTATE 5 MG/ML IJ SOLN
INTRAMUSCULAR | Status: AC
  Administered 2020-06-21: 2 mg via INTRAMUSCULAR
  Filled 2020-06-21: qty 1

## 2020-06-21 MED ORDER — DEXTROSE IN LACTATED RINGERS 5 % IV SOLN: INTRAVENOUS | Status: DC

## 2020-06-21 MED ORDER — INSULIN ASPART 100 UNIT/ML ~~LOC~~ SOLN
0.0000 [IU] | SUBCUTANEOUS | Status: DC
  Administered 2020-06-21: 5 [IU] via SUBCUTANEOUS
  Administered 2020-06-21: 3 [IU] via SUBCUTANEOUS
  Administered 2020-06-21: 1 [IU] via SUBCUTANEOUS
  Administered 2020-06-22: 5 [IU] via SUBCUTANEOUS
  Administered 2020-06-22: 3 [IU] via SUBCUTANEOUS
  Administered 2020-06-22: 9 [IU] via SUBCUTANEOUS
  Filled 2020-06-21 (×5): qty 1

## 2020-06-21 MED ORDER — IOHEXOL 350 MG/ML SOLN
75.0000 mL | Freq: Once | INTRAVENOUS | Status: AC | PRN
  Administered 2020-06-21: 75 mL via INTRAVENOUS

## 2020-06-21 MED ORDER — DEXTROSE 50 % IV SOLN
0.0000 mL | INTRAVENOUS | Status: DC | PRN
  Administered 2020-06-23: 20 mL via INTRAVENOUS

## 2020-06-21 MED ORDER — HYDROCOD POLST-CPM POLST ER 10-8 MG/5ML PO SUER: 5.0000 mL | Freq: Two times a day (BID) | ORAL | Status: DC | PRN

## 2020-06-21 MED ORDER — FUROSEMIDE 10 MG/ML IJ SOLN
40.0000 mg | Freq: Once | INTRAMUSCULAR | Status: AC
  Administered 2020-06-21: 40 mg via INTRAVENOUS
  Filled 2020-06-21: qty 4

## 2020-06-21 MED ORDER — LACTATED RINGERS IV SOLN: INTRAVENOUS | Status: DC

## 2020-06-21 NOTE — ED Notes (Signed)
Pt slightly agitated, given haldol in preparation for MRI of brain.

## 2020-06-21 NOTE — ED Notes (Signed)
Pt awake, sinus tach on monitor. meds given.  Mitts in place on hands.  telesitter with pt

## 2020-06-21 NOTE — ED Notes (Signed)
Patient returned from MRI, placed back on cardiac and oxygen monitoring, patient awake to verbal stimuli but is resting comfortable. RR 40.

## 2020-06-21 NOTE — ED Notes (Signed)
telesitter remains with patient, bed alarm and mittens remain on patient. Patient resting comfortably. Vital signs improving, MD aware.

## 2020-06-21 NOTE — ED Notes (Signed)
fsbs 288

## 2020-06-21 NOTE — ED Notes (Signed)
Patient to MRI.

## 2020-06-21 NOTE — ED Notes (Signed)
MD aware of patient status, repeat CXR and ABG ordered, RT notified. Patient fluids paused.

## 2020-06-21 NOTE — ED Notes (Signed)
telesitter set up

## 2020-06-21 NOTE — ED Notes (Signed)
RT at bedside to collect ABG 

## 2020-06-21 NOTE — ED Notes (Signed)
Resumed care from robin rn.  Pt awake.  Sinus tach on monitor.  telesitter with pt.  siderails up x 2  Iv in place

## 2020-06-21 NOTE — H&P (Addendum)
History and Physical    Ashley [name]:[id number] DOB: August 21, 1965 DOA: 06/20/2020  PCP: Prince Solian, MD   Patient coming from: Home  I have personally briefly reviewed patient's old medical records in [place]  Chief Complaint: Fever, body aches cough, weakness, covid positive x 1-2 weeks, confusion  HPI: Ashley [name] is a 55 y.o. female with medical history significant for Diabetes complicated by gastroparesis and neuropathy and peripheral artery disease on insulin pump for several years, OSA on CPAP, PAD with history of biiliac stents, moderate persistent asthma, OSA on CPAP, obesity, nicotine dependence, HTN, hypothyroidism, osteoporosis and chronic pain who tested positive for COVID just over a week ago was brought in to the emergency room with a 3-day complaint of cough and shortness of breath, fever and body aches and generalized weakness and not acting herself.  She denies chest pain, vomiting and diarrhea abdominal pain.  She received 1 of 2 of the COVID-19 series. ED course: On arrival she was afebrile but tachycardic and tachypneic with O2 sats 95% on room air, BP 138/85.  Blood work revealed blood sugar of 290 with anion gap of 20, creatinine 1.12.  Beta hydroxybutyric acid 3.99.  Subsequent blood glucose was 407.  Lactic acid 1.1, WBC normal EKG as interpreted by me: Sinus tachycardia at 134 with no acute ST-T wave changes Chest x-ray: COVID-19 pneumonia  Patient started on insulin infusion per Endo tool as well as remdesivir.  Hospitalist consulted for admission.  She is  Review of Systems: Unable to obtain due to altered mental status   Past Medical History:  Diagnosis Date  . Anxiety   . Asthma   . Depression   . Diabetes mellitus ([place])   . Esophageal stricture   . Gastroparesis   . GERD (gastroesophageal reflux disease)   . Glaucoma   . Graves disease 02/2010   diagnosed by radioactive iodine uptake and scan   . Helicobacter pylori infection    . Hyperlipidemia   . Hypertension   . Hypothyroidism   . Insulin pump in place   . Nephrolithiasis 2010   rt sided per GI  . Neuropathy    diabetic neuropathy  . Osteoporosis   . Sleep apnea   . Sleep apnea with use of continuous positive airway pressure (CPAP)   . Vitamin D deficiency     Past Surgical History:  Procedure Laterality Date  . ABDOMINAL AORTOGRAM W/LOWER EXTREMITY Bilateral 01/01/2019   Procedure: ABDOMINAL AORTOGRAM W/LOWER EXTREMITY;  Surgeon: Lorretta Harp, MD;  Location: [facility];  Service: Cardiovascular;  Laterality: Bilateral;  . BREAST EXCISIONAL BIOPSY    . BREAST LUMPECTOMY Left    LEFT-BENIGN  . NASAL SEPTOPLASTY W/ TURBINOPLASTY Bilateral 09/07/2013   Procedure: SEPTOPLASTY, BILATERAL TURBINATE RESECTION ;  Surgeon: Ascencion Dike, MD;  Location: [facility];  Service: ENT;  Laterality: Bilateral;  . PERIPHERAL VASCULAR INTERVENTION Bilateral 01/01/2019   Procedure: PERIPHERAL VASCULAR INTERVENTION;  Surgeon: Lorretta Harp, MD;  Location: [facility];  Service: Cardiovascular;  Laterality: Bilateral;  . UPPER GASTROINTESTINAL ENDOSCOPY    . VAGINAL HYSTERECTOMY  2006     reports that she has been smoking cigarettes. She has a 12.50 pack-year smoking history. She has never used smokeless tobacco. She reports that she does not drink alcohol and does not use drugs.  Allergies  Allergen Reactions  . Mirtazapine Rash    And hives  . Clindamycin Hives  . Other Hives  Tylox   . Sulfonamide Derivatives Hives  . Lipitor [Atorvastatin] Other (See Comments)    MUSCLE ACHES  . Restasis [Cyclosporine]     Pt stated, "My eyes turned red on the inside and outside of eye; burning sensation"  . Topamax [Topiramate] Itching    Family History  Problem Relation Age of Onset  . Diabetes Father   . Heart attack Father   . Alcoholism Father   . Lung cancer Mother   . Liver cancer Mother   . Irritable bowel syndrome Sister    . Colon cancer Neg Hx   . Esophageal cancer Neg Hx   . Rectal cancer Neg Hx   . Stomach cancer Neg Hx       Prior to Admission medications   Medication Sig Start Date End Date Taking? Authorizing Provider  ACCU-CHEK COMPACT PLUS test strip 1 each by Other route as needed.  10/20/12   [provider]  ACCU-CHEK SOFTCLIX LANCETS lancets  05/19/18   [provider]  albuterol (PROVENTIL) (2.5 MG/3ML) 0.083% nebulizer solution Take 2.5 mg by nebulization every 4 (four) hours as needed for wheezing or shortness of breath.  03/18/15   [provider]  ALPRAZolam Duanne Moron) 0.5 MG tablet Take 0.5 mg by mouth 3 (three) times daily as needed for anxiety.  10/08/12   [provider]  AMBULATORY NON [place] INSULIN PUMP USES AS DIRECTED    [provider]  amLODipine (NORVASC) 5 MG tablet Take 1 tablet (5 mg total) by mouth daily. Patient taking differently: Take 5 mg by mouth in the morning and at bedtime.  01/21/19   Lorretta Harp, MD  aspirin EC 81 MG EC tablet Take 1 tablet (81 mg total) by mouth daily. 01/03/19   Kroeger, Lorelee Cover., PA-C  Azelastine HCl 0.15 % SOLN Place 1 spray into the nose daily. 08/03/14   [provider]  clopidogrel (PLAVIX) 75 MG tablet TAKE 1 TABLET (75 MG TOTAL) BY MOUTH DAILY WITH BREAKFAST. 12/24/19   Lorretta Harp, MD  cyanocobalamin (,VITAMIN B-12,) 1000 MCG/ML injection Inject 1,000 mcg into the muscle every 30 (thirty) days.  12/05/17   [provider]  DULoxetine (CYMBALTA) 30 MG capsule Take 30 mg by mouth every morning. Takes 90 mg total 01/22/18   [provider]  DULoxetine (CYMBALTA) 60 MG capsule Take 60 mg by mouth every morning. Takes 90 mg total    [provider]  estradiol (ESTRACE) 0.5 MG tablet Take 0.5 mg by mouth daily. 04/07/20   [provider]  famotidine (PEPCID) 40 MG tablet Take 1 tablet (40 mg total) by mouth at bedtime. 02/24/20   Irene Shipper, MD  fluticasone [facility]) 50 MCG/ACT nasal spray Place 1 spray into the nose daily.  10/08/12   [provider]  folic acid (FOLVITE) 1 MG tablet Take 1 tablet by mouth daily. 06/23/19   [provider]  hydrochlorothiazide (MICROZIDE) 12.5 MG capsule TAKE 1 CAPSULE EVERY DAY 02/25/20   Lorretta Harp, MD  HYDROMET 5-1.5 MG/5ML syrup Take 5 mLs by mouth every 6 (six) hours as needed for cough. 05/02/20   [provider]  ibandronate (BONIVA) 150 MG tablet Take 150 mg by mouth every 30 (thirty) days. 03/23/20   [provider]  Insulin Human (INSULIN PUMP) SOLN Inject into the skin.    [provider]  lansoprazole (PREVACID) 30 MG capsule Take 1 capsule (30 mg total) by mouth in the morning  and at bedtime. 12/04/19   Irene Shipper, MD  latanoprost (XALATAN) 0.005 % ophthalmic solution Place 1 drop into both eyes at bedtime. 12/26/17   [provider]  levocetirizine (XYZAL) 5 MG tablet Take 5 mg by mouth every morning.     [provider]  levothyroxine (SYNTHROID, LEVOTHROID) 112 MCG tablet Take 112 mcg by mouth daily before breakfast.  01/07/18   [provider]  losartan (COZAAR) 100 MG tablet Take 100 mg by mouth at bedtime.     [provider]  methotrexate (RHEUMATREX) 2.5 MG tablet 5 tablets by moth twice a day once a week 06/23/19   [provider]  metoprolol (TOPROL-XL) 200 MG 24 hr tablet Take 200 mg by mouth daily. One tablet by mouth once daily  09/26/12   [provider]  montelukast (SINGULAIR) 10 MG tablet Take 10 mg by mouth at bedtime.    [provider]  NOVOLOG 100 UNIT/ML injection Inject into the skin See admin instructions. Pt has Insulin pump 08/06/18   [provider]  ondansetron (ZOFRAN-ODT) 8 MG disintegrating tablet DISSOLVE 1 TABLET ON THE TONGUE AND SWALLOW EVERY 6 TO 8 HOURS AS NEEDED FOR NAUSEA AND VOMITING 10/15/19   Noralyn Pick, NP   pregabalin (LYRICA) 75 MG capsule Take 150 mg by mouth at bedtime.  07/24/18   [provider]  PROAIR HFA 108 (90 BASE) MCG/ACT inhaler Inhale 2 puffs into the lungs every 4 (four) hours as needed for wheezing or shortness of breath.  04/07/15   [provider]  promethazine (PHENERGAN) 25 MG tablet Take 25 mg by mouth every 4 (four) hours as needed for nausea or vomiting. Every 4-6 hours as needed 08/30/14   [provider]  rosuvastatin (CRESTOR) 20 MG tablet Take 20 mg by mouth at bedtime.    [provider]  Vitamin D, Ergocalciferol, (DRISDOL) 50000 UNITS CAPS Take 50,000 Units by mouth every 7 (seven) days.    [provider]    Physical Exam: Vitals:   06/20/20 1657 06/20/20 2026 06/20/20 2347 06/21/20 0050  BP: 123/74 138/85 (!) 166/71 (!) 175/80  Pulse: (!) 115 (!) 119 (!) 132 (!) 138  Resp: (!) 22 (!) 21 (!) 28 (!) 28  Temp: 98.4 F (36.9 C) 98 F (36.7 C)    TempSrc: Oral Oral    SpO2: 94% 95% 95% 96%  Weight:      Height:         Vitals:   06/20/20 1657 06/20/20 2026 06/20/20 2347 06/21/20 0050  BP: 123/74 138/85 (!) 166/71 (!) 175/80  Pulse: (!) 115 (!) 119 (!) 132 (!) 138  Resp: (!) 22 (!) 21 (!) 28 (!) 28  Temp: 98.4 F (36.9 C) 98 F (36.7 C)    TempSrc: Oral Oral    SpO2: 94% 95% 95% 96%  Weight:      Height:          Constitutional:  Ill-appearing.  Awake, disoriented, confused, with mittens and sitter at bedside. HEENT:      Head: Normocephalic and atraumatic.         Eyes: PERLA, EOMI, Conjunctivae are normal. Sclera is non-icteric.       Mouth/Throat: Mucous membranes are moist.       Neck: Supple with no signs of meningismus. Cardiovascular:  Tachycardic. No murmurs, gallops, or rubs. 2+ symmetrical distal pulses are present . No JVD. No LE edema Respiratory:  Tachypneic .Lungs sounds diminished bilaterally. No  wheezes, crackles, or rhonchi.  Gastrointestinal: Soft, non tender, and non distended with  positive bowel sounds.  Genitourinary: No CVA tenderness. Musculoskeletal: Nontender with normal range of motion in all extremities. No cyanosis, or erythema of extremities. Neurologic:  Face is symmetric. Moving all extremities. No gross focal neurologic deficits . Skin: Skin is warm, dry.  No rash or ulcers Psychiatric: Anxious, restless  Labs on Admission: I have personally reviewed following labs and imaging studies  CBC: Recent Labs  Lab 06/20/20 1437  WBC 4.3  NEUTROABS 3.7  HGB 13.1  HCT 38.6  MCV 89.6  PLT [id number]   Basic Metabolic Panel: Recent Labs  Lab 06/20/20 1437  NA 131*  K 3.4*  CL 92*  CO2 19*  GLUCOSE 290*  BUN 22*  CREATININE 1.12*  CALCIUM 8.9   GFR: Estimated Creatinine Clearance: 48 mL/min (A) (by C-G formula based on SCr of 1.12 mg/dL (H)). Liver Function Tests: Recent Labs  Lab 06/20/20 1437  AST 75*  ALT 44  ALKPHOS 59  BILITOT 1.4*  PROT 7.7  ALBUMIN 4.3   No results for input(s): LIPASE, AMYLASE in the last 168 hours. No results for input(s): AMMONIA in the last 168 hours. Coagulation Profile: No results for input(s): INR, PROTIME in the last 168 hours. Cardiac Enzymes: No results for input(s): CKTOTAL, CKMB, CKMBINDEX, TROPONINI in the last 168 hours. BNP (last 3 results) No results for input(s): PROBNP in the last 8760 hours. HbA1C: No results for input(s): HGBA1C in the last 72 hours. CBG: Recent Labs  Lab 06/21/20 0044  GLUCAP 407*   Lipid Profile: No results for input(s): CHOL, HDL, LDLCALC, TRIG, CHOLHDL, LDLDIRECT in the last 72 hours. Thyroid Function Tests: No results for input(s): TSH, T4TOTAL, FREET4, T3FREE, THYROIDAB in the last 72 hours. Anemia Panel: No results for input(s): VITAMINB12, FOLATE, FERRITIN, TIBC, IRON, RETICCTPCT in the last 72 hours. Urine analysis:    Component Value Date/Time   COLORURINE YELLOW 05/05/2020 0052   APPEARANCEUR CLEAR 05/05/2020 0052   LABSPEC 1.014 05/05/2020 0052   PHURINE  5.0 05/05/2020 0052   GLUCOSEU 150 (A) 05/05/2020 0052   HGBUR NEGATIVE 05/05/2020 0052   BILIRUBINUR NEGATIVE 05/05/2020 0052   KETONESUR NEGATIVE 05/05/2020 0052   PROTEINUR NEGATIVE 05/05/2020 0052   UROBILINOGEN 0.2 01/11/2010 1909   NITRITE NEGATIVE 05/05/2020 0052   LEUKOCYTESUR NEGATIVE 05/05/2020 0052    Radiological Exams on Admission: DG Chest 2 View  Result Date: 06/20/2020 CLINICAL DATA:  Shortness of breath, COVID positive. Fever and body aches. EXAM: CHEST - 2 VIEW COMPARISON:  05/05/2020 chest x-ray and CT chest. FINDINGS: Patient is slightly rotated. Trachea is midline. Heart size normal. Basilar dependent peribronchovascular airspace opacities. No pleural fluid. IMPRESSION: COVID-19 pneumonia. Electronically Signed   By: Lorin Picket M.D.   On: 06/20/2020 15:35     Assessment/Plan 55 year old female with a history of DM complicated by gastroparesis, neuropathy, peripheral artery disease on insulin pump for several years, OSA on CPAP, PAD with history of biiliac stents, moderate persistent asthma, OSA on CPAP, obesity, nicotine dependence, HTN, hypothyroidism, osteoporosis and chronic pain who tested positive for COVID just over a week ago was brought in to the emergency room with a 3-day complaint of cough and shortness of breath, fever and body aches and generalized weakness and altered mental status.      DKA (diabetic ketoacidosis) ([place]) - Patient presents with blood sugar 290 but with anion gap of 20, elevated beta hydroxybutyric acid of 3.99 - Received IV  fluid bolus - Continue insulin infusion per Endo tool - Follow A1c    Pneumonia due to COVID-19 virus Secondary bacterial infection - Patient symptomatic for COVID with cough, shortness of breath, fever, body aches and generalized malaise with chest x-ray typical for COVID - Patient is not hypoxic - We will give remdesivir though of doubtful benefit given report of positive test 1 to 2 weeks prior - No  steroids as she is not yet hypoxic - Antitussives, albuterol as needed, vitamins - Supplemental oxygen if needed - Addendum: Following admission, patient became febrile, procalcitonin elevated started on IV antibiotics  Acute metabolic encephalopathy, suspect COVID encephalopathy - Suspect DKA in combination with COVID encephalopathy - Fall and aspiration precautions - Neurologic checks - Continue bedside sitter    Gastroparesis - Not appears acutely active.  No vomiting    Moderate persistent asthma - Continue home inhalers with albuterol as needed    OSA on CPAP - Nighttime CPAP if desired    Essential hypertension - Continue home antihypertensives    Peripheral arterial disease ([facility]) - Continue antiplatelets and statins    Hypothyroidism - Continue levothyroxine      DVT prophylaxis: Lovenox  Code Status: full code  Family Communication:  none  Disposition Plan: Back to previous home environment Consults called: none  Status:.At the time of admission, it appears that the appropriate admission status for this patient is INPATIENT. This is judged to be reasonable and necessary in order to provide the required intensity of service to ensure the patient's safety given the presenting symptoms, physical exam findings, and initial radiographic and laboratory data in the context of their  Comorbid conditions.   Patient requires inpatient status due to high intensity of service, high risk for further deterioration and high frequency of surveillance required.   I certify that at the point of admission it is my clinical judgment that the patient will require inpatient hospital care spanning beyond [place] MD [facility]     06/21/2020, 1:27 AM

## 2020-06-21 NOTE — ED Provider Notes (Addendum)
[facility] Emergency Department Provider Note   ____________________________________________   Event Date/Time   First MD Initiated Contact with Patient 06/20/20 2314     (approximate)  I have reviewed the triage vital signs and the nursing notes.   HISTORY  Chief Complaint Weakness and Covid Positive    HPI Ashley [name] is a 55 y.o. female brought to the ED via EMS from home with a chief complaint of generalized weakness, malaise, fever, cough, shortness of breath and body aches.  Patient has a history of diabetes with insulin pump, Graves' disease, GERD, hypertension who states she tested positive for COVID 1 to 2 weeks ago.  Complaints of the above symptoms x2 to 3 days.  Denies chest pain, abdominal pain, nausea, vomiting or diarrhea.  Patient is partially vaccinated against [id number] with 1/2 shots.     Past Medical History:  Diagnosis Date  . Anxiety   . Asthma   . Depression   . Diabetes mellitus ([place])   . Esophageal stricture   . Gastroparesis   . GERD (gastroesophageal reflux disease)   . Glaucoma   . Graves disease 02/2010   diagnosed by radioactive iodine uptake and scan   . Helicobacter pylori infection   . Hyperlipidemia   . Hypertension   . Hypothyroidism   . Insulin pump in place   . Nephrolithiasis 2010   rt sided per GI  . Neuropathy    diabetic neuropathy  . Osteoporosis   . Sleep apnea   . Sleep apnea with use of continuous positive airway pressure (CPAP)   . Vitamin D deficiency     Patient Active Problem List   Diagnosis Date Noted  . DKA (diabetic ketoacidosis) ([place]) 06/21/2020  . Rheumatoid arthritis ([place]) 06/21/2020  . Pneumonia due to COVID-19 virus 06/21/2020  . Tobacco abuse 02/24/2020  . Claudication in peripheral vascular disease ([place]) 01/01/2019  . Essential hypertension 12/25/2018  . Hyperlipidemia 12/25/2018  . Peripheral arterial disease ([place]) 12/25/2018  . Hypothyroidism 12/09/2018  .  Hypertensive disorder 12/09/2018  . OSA on CPAP 10/08/2018  . Loud snoring 05/19/2018  . OSA and COPD overlap syndrome ([place]) 04/02/2018  . Excessive daytime sleepiness 04/02/2018  . Delayed sleep phase syndrome 04/02/2018  . Moderate persistent asthma 04/02/2018  . Dyspnea and respiratory abnormality 04/26/2015  . Chest tightness 04/26/2015  . Hoarseness of voice 04/26/2015  . Nausea with vomiting 10/22/2012  . Diarrhea 10/22/2012  . Gastroparesis 03/22/2009  . DYSPHAGIA 03/22/2009  . DIAB W/O MENTION COMP TYPE I [JUV TYPE] UNCNTRL 02/28/2009  . GERD 02/28/2009  . DYSPHAGIA UNSPECIFIED 02/28/2009  . ABDOMINAL PAIN-EPIGASTRIC 02/28/2009    Past Surgical History:  Procedure Laterality Date  . ABDOMINAL AORTOGRAM W/LOWER EXTREMITY Bilateral 01/01/2019   Procedure: ABDOMINAL AORTOGRAM W/LOWER EXTREMITY;  Surgeon: Lorretta Harp, MD;  Location: [facility];  Service: Cardiovascular;  Laterality: Bilateral;  . BREAST EXCISIONAL BIOPSY    . BREAST LUMPECTOMY Left    LEFT-BENIGN  . NASAL SEPTOPLASTY W/ TURBINOPLASTY Bilateral 09/07/2013   Procedure: SEPTOPLASTY, BILATERAL TURBINATE RESECTION ;  Surgeon: Ascencion Dike, MD;  Location: [facility];  Service: ENT;  Laterality: Bilateral;  . PERIPHERAL VASCULAR INTERVENTION Bilateral 01/01/2019   Procedure: PERIPHERAL VASCULAR INTERVENTION;  Surgeon: Lorretta Harp, MD;  Location: [facility];  Service: Cardiovascular;  Laterality: Bilateral;  . UPPER GASTROINTESTINAL ENDOSCOPY    . VAGINAL HYSTERECTOMY  2006    Prior to Admission medications   Medication  Sig Start Date End Date Taking? Authorizing Provider  ACCU-CHEK COMPACT PLUS test strip 1 each by Other route as needed.  10/20/12   [provider]  ACCU-CHEK SOFTCLIX LANCETS lancets  05/19/18   [provider]  albuterol (PROVENTIL) (2.5 MG/3ML) 0.083% nebulizer solution Take 2.5 mg by nebulization every 4 (four) hours as needed for wheezing  or shortness of breath.  03/18/15   [provider]  ALPRAZolam Duanne Moron) 0.5 MG tablet Take 0.5 mg by mouth 3 (three) times daily as needed for anxiety.  10/08/12   [provider]  AMBULATORY NON [place] INSULIN PUMP USES AS DIRECTED    [provider]  amLODipine (NORVASC) 5 MG tablet Take 1 tablet (5 mg total) by mouth daily. Patient taking differently: Take 5 mg by mouth in the morning and at bedtime.  01/21/19   Lorretta Harp, MD  aspirin EC 81 MG EC tablet Take 1 tablet (81 mg total) by mouth daily. 01/03/19   Kroeger, Lorelee Cover., PA-C  Azelastine HCl 0.15 % SOLN Place 1 spray into the nose daily. 08/03/14   [provider]  clopidogrel (PLAVIX) 75 MG tablet TAKE 1 TABLET (75 MG TOTAL) BY MOUTH DAILY WITH BREAKFAST. 12/24/19   Lorretta Harp, MD  cyanocobalamin (,VITAMIN B-12,) 1000 MCG/ML injection Inject 1,000 mcg into the muscle every 30 (thirty) days.  12/05/17   [provider]  DULoxetine (CYMBALTA) 30 MG capsule Take 30 mg by mouth every morning. Takes 90 mg total 01/22/18   [provider]  DULoxetine (CYMBALTA) 60 MG capsule Take 60 mg by mouth every morning. Takes 90 mg total    [provider]  estradiol (ESTRACE) 0.5 MG tablet Take 0.5 mg by mouth daily. 04/07/20   [provider]  famotidine (PEPCID) 40 MG tablet Take 1 tablet (40 mg total) by mouth at bedtime. 02/24/20   Irene Shipper, MD  fluticasone [facility]) 50 MCG/ACT nasal spray Place 1 spray into the nose daily.  10/08/12   [provider]  folic acid (FOLVITE) 1 MG tablet Take 1 tablet by mouth daily. 06/23/19   [provider]  hydrochlorothiazide (MICROZIDE) 12.5 MG capsule TAKE 1 CAPSULE EVERY DAY 02/25/20   Lorretta Harp, MD  HYDROMET 5-1.5 MG/5ML syrup Take 5 mLs by mouth every 6 (six) hours as needed for cough. 05/02/20   [provider]  ibandronate (BONIVA) 150 MG tablet Take 150 mg by mouth every  30 (thirty) days. 03/23/20   [provider]  Insulin Human (INSULIN PUMP) SOLN Inject into the skin.    [provider]  lansoprazole (PREVACID) 30 MG capsule Take 1 capsule (30 mg total) by mouth in the morning and at bedtime. 12/04/19   Irene Shipper, MD  latanoprost (XALATAN) 0.005 % ophthalmic solution Place 1 drop into both eyes at bedtime. 12/26/17   [provider]  levocetirizine (XYZAL) 5 MG tablet Take 5 mg by mouth every morning.     [provider]  levothyroxine (SYNTHROID, LEVOTHROID) 112 MCG tablet Take 112 mcg by mouth daily before breakfast.  01/07/18   [provider]  losartan (COZAAR) 100 MG tablet Take 100 mg by mouth at bedtime.     [provider]  methotrexate (RHEUMATREX) 2.5 MG tablet 5 tablets by moth twice a day once a week 06/23/19   [provider]  metoprolol (TOPROL-XL) 200 MG 24 hr tablet Take 200 mg by mouth daily. One tablet by mouth  once daily  09/26/12   [provider]  montelukast (SINGULAIR) 10 MG tablet Take 10 mg by mouth at bedtime.    [provider]  NOVOLOG 100 UNIT/ML injection Inject into the skin See admin instructions. Pt has Insulin pump 08/06/18   [provider]  ondansetron (ZOFRAN-ODT) 8 MG disintegrating tablet DISSOLVE 1 TABLET ON THE TONGUE AND SWALLOW EVERY 6 TO 8 HOURS AS NEEDED FOR NAUSEA AND VOMITING 10/15/19   Noralyn Pick, NP  pregabalin (LYRICA) 75 MG capsule Take 150 mg by mouth at bedtime.  07/24/18   [provider]  PROAIR HFA 108 (90 BASE) MCG/ACT inhaler Inhale 2 puffs into the lungs every 4 (four) hours as needed for wheezing or shortness of breath.  04/07/15   [provider]  promethazine (PHENERGAN) 25 MG tablet Take 25 mg by mouth every 4 (four) hours as needed for nausea or vomiting. Every 4-6 hours as needed 08/30/14   [provider]  rosuvastatin (CRESTOR) 20 MG tablet Take 20 mg by mouth at bedtime.     [provider]  Vitamin D, Ergocalciferol, (DRISDOL) 50000 UNITS CAPS Take 50,000 Units by mouth every 7 (seven) days.    [provider]    Allergies Mirtazapine, Clindamycin, Other, Sulfonamide derivatives, Lipitor [atorvastatin], Restasis [cyclosporine], and Topamax [topiramate]  Family History  Problem Relation Age of Onset  . Diabetes Father   . Heart attack Father   . Alcoholism Father   . Lung cancer Mother   . Liver cancer Mother   . Irritable bowel syndrome Sister   . Colon cancer Neg Hx   . Esophageal cancer Neg Hx   . Rectal cancer Neg Hx   . Stomach cancer Neg Hx     Social History Social History   Tobacco Use  . Smoking status: Current Some Day Smoker    Packs/day: 0.50    Years: 25.00    Pack years: 12.50    Types: Cigarettes  . Smokeless tobacco: Never Used  . Tobacco comment: 6-8 CIGS A DAY  Vaping Use  . Vaping Use: Never used  Substance Use Topics  . Alcohol use: No    Alcohol/week: 0.0 standard drinks  . Drug use: No    Review of Systems  Constitutional: Positive for fever, body aches, generalized malaise and weakness Eyes: No visual changes. ENT: No sore throat. Cardiovascular: Denies chest pain. Respiratory: Positive for cough and shortness of breath. Gastrointestinal: No abdominal pain.  No nausea, no vomiting.  No diarrhea.  No constipation. Genitourinary: Negative for dysuria. Musculoskeletal: Negative for back pain. Skin: Negative for rash. Neurological: Negative for headaches, focal weakness or numbness.   ____________________________________________   PHYSICAL EXAM:  VITAL SIGNS: ED Triage Vitals  Enc Vitals Group     BP 06/20/20 1428 (!) 150/67     Pulse Rate 06/20/20 1428 (!) 123     Resp 06/20/20 1428 (!) 22     Temp 06/20/20 1428 98.9 F (37.2 C)     Temp Source 06/20/20 1428 Oral     SpO2 06/20/20 1428 94 %     Weight 06/20/20 1432 149 lb (67.6 kg)     Height 06/20/20 1432 4\' 11"  (1.499 m)      Head Circumference --      Peak Flow --      Pain Score 06/20/20 1432 0     Pain Loc --      Pain Edu? --      Excl. in  GC? --     Constitutional: Alert and oriented.  Ill appearing and in moderate acute distress. Eyes: Conjunctivae are normal. PERRL. EOMI. Head: Atraumatic. Nose: No congestion/rhinnorhea. Mouth/Throat: Mucous membranes are mildly dry.   Neck: No stridor.   Cardiovascular: Tachycardic rate, regular rhythm. Grossly normal heart sounds.  Good peripheral circulation. Respiratory: Increased respiratory effort consistent with Kussmaul breathing.  No retractions. Lungs slightly diminished bilaterally. Gastrointestinal: Soft and nontender to light or deep palpation. No distention. No abdominal bruits. No CVA tenderness. Musculoskeletal: No lower extremity tenderness nor edema.  No joint effusions. Neurologic:  Normal speech and language. No gross focal neurologic deficits are appreciated.  Skin:  Skin is warm, dry and intact. No rash noted.  No petechiae. Psychiatric: Mood and affect are normal. Speech and behavior are normal.  ____________________________________________   LABS (all labs ordered are listed, but only abnormal results are displayed)  Labs Reviewed  RESP PANEL BY RT-PCR (FLU A&B, COVID) ARPGX2 - Abnormal; Notable for the following components:      Result Value   SARS Coronavirus 2 by RT PCR POSITIVE (*)    All other components within normal limits  COMPREHENSIVE METABOLIC PANEL - Abnormal; Notable for the following components:   Sodium 131 (*)    Potassium 3.4 (*)    Chloride 92 (*)    CO2 19 (*)    Glucose, Bld 290 (*)    BUN 22 (*)    Creatinine, Ser 1.12 (*)    AST 75 (*)    Total Bilirubin 1.4 (*)    GFR, Estimated 58 (*)    Anion gap 20 (*)    All other components within normal limits  CBC WITH DIFFERENTIAL/PLATELET - Abnormal; Notable for the following components:   Lymphs Abs 0.5 (*)    All other components within normal limits   BETA-HYDROXYBUTYRIC ACID - Abnormal; Notable for the following components:   Beta-Hydroxybutyric Acid 3.99 (*)    All other components within normal limits  BRAIN NATRIURETIC PEPTIDE - Abnormal; Notable for the following components:   B Natriuretic Peptide 128.8 (*)    All other components within normal limits  C-REACTIVE PROTEIN - Abnormal; Notable for the following components:   CRP 6.9 (*)    All other components within normal limits  FERRITIN - Abnormal; Notable for the following components:   Ferritin 877 (*)    All other components within normal limits  FIBRINOGEN - Abnormal; Notable for the following components:   Fibrinogen 620 (*)    All other components within normal limits  LACTATE DEHYDROGENASE - Abnormal; Notable for the following components:   LDH 453 (*)    All other components within normal limits  BLOOD GAS, ARTERIAL - Abnormal; Notable for the following components:   pH, Arterial 7.52 (*)    pCO2 arterial <19.0 (*)    pO2, Arterial 64 (*)    Bicarbonate 13.9 (*)    Acid-base deficit 6.4 (*)    All other components within normal limits  CBC - Abnormal; Notable for the following components:   HCT 35.9 (*)    Platelets 144 (*)    nRBC 0.4 (*)    All other components within normal limits  CREATININE, SERUM - Abnormal; Notable for the following components:   Creatinine, Ser 1.15 (*)    GFR, Estimated 57 (*)    All other components within normal limits  D-DIMER, QUANTITATIVE (NOT AT [facility]) - Abnormal; Notable for the following components:   D-Dimer, Quant 1.32 (*)  All other components within normal limits  CBG MONITORING, ED - Abnormal; Notable for the following components:   Glucose-Capillary 407 (*)    All other components within normal limits  CBG MONITORING, ED - Abnormal; Notable for the following components:   Glucose-Capillary 415 (*)    All other components within normal limits  CBG MONITORING, ED - Abnormal; Notable for the following components:    Glucose-Capillary 394 (*)    All other components within normal limits  CBG MONITORING, ED - Abnormal; Notable for the following components:   Glucose-Capillary 330 (*)    All other components within normal limits  CBG MONITORING, ED - Abnormal; Notable for the following components:   Glucose-Capillary 270 (*)    All other components within normal limits  CBG MONITORING, ED - Abnormal; Notable for the following components:   Glucose-Capillary 192 (*)    All other components within normal limits  TROPONIN I (HIGH SENSITIVITY) - Abnormal; Notable for the following components:   Troponin I (High Sensitivity) 76 (*)    All other components within normal limits  LACTIC ACID, PLASMA  PROCALCITONIN  URINALYSIS, COMPLETE (UACMP) WITH MICROSCOPIC  HIV ANTIBODY (ROUTINE TESTING W REFLEX)  HEPATITIS B SURFACE ANTIGEN  HEMOGLOBIN A1C  POC SARS CORONAVIRUS 2 AG -  ED  ABO/RH   ____________________________________________  EKG  ED ECG REPORT I, Crystallee Werden J, the attending physician, personally viewed and interpreted this ECG.   Date: 06/21/2020  EKG Time: 0025  Rate: 134  Rhythm: sinus tachycardia  Axis: Normal  Intervals:nonspecific intraventricular conduction delay  ST&T Change: Nonspecific  ____________________________________________  RADIOLOGY I, Ward Boissonneault J, personally viewed and evaluated these images (plain radiographs) as part of my medical decision making, as well as reviewing the written report by the radiologist.  ED MD interpretation: COVID-19 pneumonia  Official radiology report(s): DG Chest 2 View  Result Date: 06/20/2020 CLINICAL DATA:  Shortness of breath, COVID positive. Fever and body aches. EXAM: CHEST - 2 VIEW COMPARISON:  05/05/2020 chest x-ray and CT chest. FINDINGS: Patient is slightly rotated. Trachea is midline. Heart size normal. Basilar dependent peribronchovascular airspace opacities. No pleural fluid. IMPRESSION: COVID-19 pneumonia. Electronically Signed    By: Lorin Picket M.D.   On: 06/20/2020 15:35    ____________________________________________   PROCEDURES  Procedure(s) performed (including Critical Care):  .1-3 Lead EKG Interpretation Performed by: Paulette Blanch, MD Authorized by: Paulette Blanch, MD     Interpretation: abnormal     ECG rate:  130   ECG rate assessment: tachycardic     Rhythm: sinus tachycardia     Ectopy: none     Conduction: normal   Comments:     Patient placed on cardiac monitor to evaluate for arrhythmias   CRITICAL CARE Performed by: Paulette Blanch   Total critical care time: 60 minutes  Critical care time was exclusive of separately billable procedures and treating other patients.  Critical care was necessary to treat or prevent imminent or life-threatening deterioration.  Critical care was time spent personally by me on the following activities: development of treatment plan with patient and/or surrogate as well as nursing, discussions with consultants, evaluation of patient's response to treatment, examination of patient, obtaining history from patient or surrogate, ordering and performing treatments and interventions, ordering and review of laboratory studies, ordering and review of radiographic studies, pulse oximetry and re-evaluation of patient's condition.   ____________________________________________   INITIAL IMPRESSION / ASSESSMENT AND PLAN / ED COURSE  As part of my medical  decision making, I reviewed the following data within the [place] notes reviewed and incorporated, Labs reviewed, EKG interpreted, Old chart reviewed, Radiograph reviewed, Discussed with admitting physician and Notes from prior ED visits (05/05/2020 ED visit)  55 year old IDDM who tested positive for COVID 1 to 2 weeks ago presenting with generalized weakness, body aches, fever, cough, shortness of breath and elevated blood sugar.  Differential diagnosis includes but is not limited to  COVID-19 pneumonia, DKA, metabolic derangement, ACS, PE, etc.  Laboratory results concerning for DKA with glucose 290, AG 20, beta hydroxybutyrate 3.99.  X-ray shows signs of COVID-19 pneumonia.  Patient cannot produce results of her COVID-19 test so we will retest her here.  Has received 1 L normal saline bolus.  Currently receiving 1 L LR bolus.  We will place on DKA protocol, add Remdesivir.  We will discuss utility of IV Solu-Medrol with hospitalist services given patient's admitting diagnosis of DKA.  Have asked patient to turn off her insulin pump.  Clinical Course as of 06/21/20 0635  Tue Jun 21, 2020  0156 ABG and elevated troponin noted which is likely secondary to demand ischemia. [JS]  0635 Temperature 105 F.  Rectal Tylenol will be administered.  Noted elevated procalcitonin with normal lactic acid.  Will initiate broad-spectrum IV antibiotics. [JS]    Clinical Course User Index [JS] Paulette Blanch, MD     ____________________________________________   FINAL CLINICAL IMPRESSION(S) / ED DIAGNOSES  Final diagnoses:  Generalized weakness  Dehydration  Diabetic ketoacidosis without coma associated with type 2 diabetes mellitus ([place])  Pneumonia due to COVID-19 virus  Sepsis, due to unspecified organism, unspecified whether acute organ dysfunction present ([place])  Fever, unspecified fever cause     ED Discharge Orders    None      *Please note:  Ashley [name] was evaluated in Emergency Department on 06/21/2020 for the symptoms described in the history of present illness. She was evaluated in the context of the global COVID-19 pandemic, which necessitated consideration that the patient might be at risk for infection with the SARS-CoV-2 virus that causes COVID-19. Institutional protocols and algorithms that pertain to the evaluation of patients at risk for COVID-19 are in a state of rapid change based on information released by regulatory bodies including the CDC and federal and  state organizations. These policies and algorithms were followed during the patient's care in the ED.  Some ED evaluations and interventions may be delayed as a result of limited staffing during and the pandemic.*   Note:  This document was prepared using Dragon voice recognition software and may include unintentional dictation errors.   Paulette Blanch, MD 06/21/20 0507    Paulette Blanch, MD 06/21/20 [phone]

## 2020-06-21 NOTE — Progress Notes (Signed)
Inpatient Diabetes Program Recommendations  AACE/ADA: New Consensus Statement on Inpatient Glycemic Control   Target Ranges:  Prepandial:   less than 140 mg/dL      Peak postprandial:   less than 180 mg/dL (1-2 hours)      Critically ill patients:  140 - 180 mg/dL   Results for Ashley [name], Ashley [name] (MRN [id number]) as of 06/21/2020 07:42  Ref. Range 06/21/2020 02:01 06/21/2020 02:28 06/21/2020 03:33 06/21/2020 04:36 06/21/2020 06:02 06/21/2020 07:20  Glucose-Capillary Latest Ref Range: 70 - 99 mg/dL 415 (H) 394 (H) 330 (H) 270 (H) 192 (H) 246 (H)  Results for Ashley [name], Ashley [name] (MRN [id number]) as of 06/21/2020 07:42  Ref. Range 06/20/2020 14:37  Beta-Hydroxybutyric Acid Latest Ref Range: 0.05 - 0.27 mmol/L 3.99 (H)  Glucose Latest Ref Range: 70 - 99 mg/dL 290 (H)   Review of Glycemic Control  Diabetes history: DM1 Outpatient Diabetes medications: Insulin Pump with Novolog Current orders for Inpatient glycemic control: IV insulin  Inpatient Diabetes Program Recommendations:    Insulin: IV insulin should be continued until acidosis has completely resolved as evidenced by CO2 >20, AG <10-12, and beta-hydroxybutyric acid less than 0.5). Once acidosis is cleared and MD is ready to transition from IV to SQ insulin, please consider ordering Lantus 20 units Q24H, CBGs Q4H, Novolog 0-9 units Q4H, and if patient is ordered a diet and eating please order Novolog 3 units TID with meals for meal coverage if patient eats at least 50% of meals.  NOTE: Patient is currently in the Emergency Department being admitted with DKA, COVID, and pneumonia. Per note by K. Rosana Berger, RN at 2:43 am today, patient's insulin pump was placed in belongings bag.  Patient is currently ordered IV insulin which should be continued until acidosis has completely cleared.   Thanks, Barnie Alderman, RN, MSN, CDE Diabetes Coordinator Inpatient Diabetes Program [phone] (Team Pager from 8am to 5pm)

## 2020-06-21 NOTE — ED Notes (Signed)
Patients son Fritz Pickerel updated on plan of care. MD at bedside.

## 2020-06-21 NOTE — ED Notes (Signed)
Per endotool con with 8 units of insulin infusion

## 2020-06-21 NOTE — Consult Note (Signed)
Pharmacy Antibiotic Note  Ashley [name] is a 55 y.o. female admitted on 06/20/2020 with sepsis.  Pharmacy has been consulted for cefepime and vancomycin dosing. Tmax of 105.3. WBC 5.1, procal 1.43. COVID+ on remdesivir. Received pip/tazo 1/11 @0649   Plan: Will start cefepime 2 g q12H  Will give vancomycin loading dose of 1500 mg x 1 followed by vancomycin 1000 mg q24H. Predicted AUC of 569 (Goal AUC 400-550). Scr used 1.15. Vd: 0.72. Monitor patient's renal function as Scr is trending up. Plan to order vancomycin level in the next 4-5 days.   Height: 4\' 11"  (149.9 cm) Weight: 67.6 kg (149 lb) IBW/kg (Calculated) : 43.2  Temp (24hrs), Avg:100.3 F (37.9 C), Min:98 F (36.7 C), Max:105.3 F (40.7 C)  Recent Labs  Lab 06/20/20 1437 06/21/20 0323  WBC 4.3 5.1  CREATININE 1.12* 1.15*  LATICACIDVEN 1.1  --     Estimated Creatinine Clearance: 46.8 mL/min (A) (by C-G formula based on SCr of 1.15 mg/dL (H)).    Allergies  Allergen Reactions  . Mirtazapine Rash    And hives  . Clindamycin Hives  . Other Hives    Tylox   . Sulfonamide Derivatives Hives  . Lipitor [Atorvastatin] Other (See Comments)    MUSCLE ACHES  . Restasis [Cyclosporine]     Pt stated, "My eyes turned red on the inside and outside of eye; burning sensation"  . Topamax [Topiramate] Itching    Antimicrobials this admission: 1/11 cefepime >>  1/11 vancomycin >>   Dose adjustments this admission: None   Microbiology results: None  Thank you for allowing pharmacy to be a part of this patient's care.  Oswald Hillock 06/21/2020 8:04 AM

## 2020-06-21 NOTE — ED Notes (Signed)
Pt insulin pump placed in pt belongings

## 2020-06-21 NOTE — ED Notes (Signed)
Tele sitter remains with patient, mittents and bed alarm in place. Patient easily redirectable and calm.

## 2020-06-21 NOTE — Progress Notes (Addendum)
This is a nonbillable note Brief hospitalist update note  55 year old female extensive past medical history as per medical record who presents for lethargy, subjective fevers at home, decreased level of consciousness.  Found to have multifocal pneumonia in the setting of COVID-19 infection.  Also with elevated procalcitonin concerning for bacterial pulm infection.  Also presented with DKA/HHS.  She has been on intravenous insulin infusion via Endo tool protocol.  Anion gap closed.  Glycemic control improved.  Will transition off of IV insulin.  Patient has been altered and tachypneic.  New oxygen requirement.  Likely fluid overload in the setting of aggressive fluid resuscitation.  We will hold all IV fluids at this time.  Transition off IV insulin.  Advance diet as tolerated.  Continue remdesivir for COVID-pneumonia.  Continue broad-spectrum antibiotics for suspected bacterial coinfection.  Patient is critically ill with numerous medical comorbidities.  At this time she is appropriate for admission to stepdown unit.  If her clinical situation improves may be able to downgrade this there is a scarcity of bed space in the stepdown unit at this time.  Case discussed with neurology consultant.  Had question regarding possible LP given AMS and fevers.  Recommend MRI brain.  Will order and follow up as appropriate.  Ralene Muskrat MD

## 2020-06-21 NOTE — Progress Notes (Signed)
Remdesivir - Pharmacy Brief Note   O:  ALT: 44  CXR:  SpO2: 96 % on    A/P:  Remdesivir 200 mg IVPB once followed by 100 mg IVPB daily x 4 days.   Suhail Peloquin D 06/21/2020 1:28 AM

## 2020-06-21 NOTE — ED Notes (Signed)
Phlebotomy at bedside, able to collect 1 set of cultures.

## 2020-06-22 DIAGNOSIS — E1110 Type 2 diabetes mellitus with ketoacidosis without coma: Secondary | ICD-10-CM | POA: Diagnosis not present

## 2020-06-22 LAB — COMPREHENSIVE METABOLIC PANEL
ALT: 41 U/L (ref 0–44)
AST: 86 U/L — ABNORMAL HIGH (ref 15–41)
Albumin: 3.3 g/dL — ABNORMAL LOW (ref 3.5–5.0)
Alkaline Phosphatase: 60 U/L (ref 38–126)
Anion gap: 10 (ref 5–15)
BUN: 21 mg/dL — ABNORMAL HIGH (ref 6–20)
CO2: 26 mmol/L (ref 22–32)
Calcium: 8.3 mg/dL — ABNORMAL LOW (ref 8.9–10.3)
Chloride: 109 mmol/L (ref 98–111)
Creatinine, Ser: 0.93 mg/dL (ref 0.44–1.00)
GFR, Estimated: >60 mL/min (ref >60)
Glucose, Bld: 260 mg/dL — ABNORMAL HIGH (ref 70–99)
Potassium: 3.5 mmol/L (ref 3.5–5.1)
Sodium: 145 mmol/L (ref 135–145)
Total Bilirubin: 0.9 mg/dL (ref 0.3–1.2)
Total Protein: 6.6 g/dL (ref 6.5–8.1)

## 2020-06-22 LAB — CBC WITH DIFFERENTIAL/PLATELET
Abs Immature Granulocytes: 0.12 10*3/uL — ABNORMAL HIGH (ref 0.00–0.07)
Basophils Absolute: 0 10*3/uL (ref 0.0–0.1)
Basophils Relative: 0 %
Eosinophils Absolute: 0 10*3/uL (ref 0.0–0.5)
Eosinophils Relative: 0 %
HCT: 37.2 % (ref 36.0–46.0)
Hemoglobin: 12.8 g/dL (ref 12.0–15.0)
Immature Granulocytes: 1 %
Lymphocytes Relative: 13 %
Lymphs Abs: 1.1 10*3/uL (ref 0.7–4.0)
MCH: 31.3 pg (ref 26.0–34.0)
MCHC: 34.4 g/dL (ref 30.0–36.0)
MCV: 91 fL (ref 80.0–100.0)
Monocytes Absolute: 0.4 10*3/uL (ref 0.1–1.0)
Monocytes Relative: 5 %
Neutro Abs: 7.3 10*3/uL (ref 1.7–7.7)
Neutrophils Relative %: 81 %
Platelets: 151 10*3/uL (ref 150–400)
RBC: 4.09 MIL/uL (ref 3.87–5.11)
RDW: 15.1 % (ref 11.5–15.5)
WBC: 9 10*3/uL (ref 4.0–10.5)
nRBC: 0 % (ref 0.0–0.2)

## 2020-06-22 LAB — GLUCOSE, CAPILLARY
Glucose-Capillary: 171 mg/dL — ABNORMAL HIGH (ref 70–99)
Glucose-Capillary: 260 mg/dL — ABNORMAL HIGH (ref 70–99)
Glucose-Capillary: 279 mg/dL — ABNORMAL HIGH (ref 70–99)
Glucose-Capillary: 337 mg/dL — ABNORMAL HIGH (ref 70–99)
Glucose-Capillary: 364 mg/dL — ABNORMAL HIGH (ref 70–99)

## 2020-06-22 LAB — MAGNESIUM: Magnesium: 2.2 mg/dL (ref 1.7–2.4)

## 2020-06-22 LAB — CBG MONITORING, ED
Glucose-Capillary: 234 mg/dL — ABNORMAL HIGH (ref 70–99)
Glucose-Capillary: 288 mg/dL — ABNORMAL HIGH (ref 70–99)

## 2020-06-22 LAB — LACTIC ACID, PLASMA: Lactic Acid, Venous: 2.3 mmol/L (ref 0.5–1.9)

## 2020-06-22 LAB — MRSA PCR SCREENING: MRSA by PCR: NEGATIVE

## 2020-06-22 LAB — FIBRIN DERIVATIVES D-DIMER (ARMC ONLY): Fibrin derivatives D-dimer (ARMC): 1967.11 ng/mL (FEU) — ABNORMAL HIGH (ref 0.00–499.00)

## 2020-06-22 LAB — C-REACTIVE PROTEIN: CRP: 9.4 mg/dL — ABNORMAL HIGH (ref <1.0)

## 2020-06-22 MED ORDER — METHYLPREDNISOLONE SODIUM SUCC 40 MG IJ SOLR
40.0000 mg | Freq: Two times a day (BID) | INTRAMUSCULAR | Status: DC
  Administered 2020-06-22 – 2020-06-26 (×9): 40 mg via INTRAVENOUS
  Filled 2020-06-22 (×8): qty 1

## 2020-06-22 MED ORDER — CHLORHEXIDINE GLUCONATE CLOTH 2 % EX PADS
6.0000 | MEDICATED_PAD | Freq: Every day | CUTANEOUS | Status: DC
  Administered 2020-06-22 – 2020-07-03 (×11): 6 via TOPICAL
  Filled 2020-06-22: qty 6

## 2020-06-22 MED ORDER — VANCOMYCIN HCL 750 MG/150ML IV SOLN
750.0000 mg | Freq: Two times a day (BID) | INTRAVENOUS | Status: DC
  Administered 2020-06-22 – 2020-06-23 (×2): 750 mg via INTRAVENOUS
  Filled 2020-06-22 (×5): qty 150

## 2020-06-22 MED ORDER — INSULIN GLARGINE 100 UNIT/ML ~~LOC~~ SOLN
26.0000 [IU] | SUBCUTANEOUS | Status: DC
  Administered 2020-06-22: 26 [IU] via SUBCUTANEOUS
  Filled 2020-06-22 (×3): qty 0.26

## 2020-06-22 MED ORDER — INSULIN ASPART 100 UNIT/ML ~~LOC~~ SOLN
0.0000 [IU] | SUBCUTANEOUS | Status: DC
  Administered 2020-06-22: 11 [IU] via SUBCUTANEOUS
  Administered 2020-06-22: 3 [IU] via SUBCUTANEOUS
  Administered 2020-06-22 – 2020-06-23 (×2): 8 [IU] via SUBCUTANEOUS
  Administered 2020-06-23: 5 [IU] via SUBCUTANEOUS
  Filled 2020-06-22 (×5): qty 1

## 2020-06-22 MED ORDER — SODIUM CHLORIDE 0.9 % IV SOLN
2.0000 g | Freq: Three times a day (TID) | INTRAVENOUS | Status: DC
  Administered 2020-06-22 – 2020-06-23 (×2): 2 g via INTRAVENOUS
  Filled 2020-06-22 (×6): qty 2

## 2020-06-22 MED ORDER — FUROSEMIDE 10 MG/ML IJ SOLN
40.0000 mg | Freq: Once | INTRAMUSCULAR | Status: AC
  Administered 2020-06-22: 40 mg via INTRAVENOUS
  Filled 2020-06-22: qty 4

## 2020-06-22 MED ORDER — METOPROLOL TARTRATE 5 MG/5ML IV SOLN: 5.0000 mg | INTRAVENOUS | Status: DC | PRN

## 2020-06-22 NOTE — ED Notes (Signed)
telesitter with pt.  siderails up x 2  Sinus tach on monitor.

## 2020-06-22 NOTE — ED Notes (Signed)
Due to limited access lab contacted to collect blood cultures

## 2020-06-22 NOTE — Consult Note (Addendum)
Pharmacy Antibiotic Note  Ashley [name] is a 55 y.o. female admitted on 06/20/2020 with sepsis.  Pharmacy has been consulted for cefepime and vancomycin dosing. Tmax of 105.3. WBC 5.1, procal 1.43. COVID+ on remdesivir. Received pip/tazo 1/11 @0649    Today, 06/22/2020 Day #2 antibiotics  SCR WNL  WBC WNL  Remains febrile - ? From COVID vs other cause  Increasing oxygen needs, now HFNC  Blood cx unrevealing  MRSA PCR neg  Plan:  Adjust vancomycin to 750mg  IV q12h (goal AUC 400-600)  Using SCr 0.93 mg/dl and total body weight for est CrCl 72 ml/min for calculated AUC 499 (trough 14.8 mcg/ml)  Change cefepime to 2gm IV q8h  As predict CrCl is >75ml/min (suspect height underestimating CrCl)   Monitor patient's renal function  Plan to order vancomycin level in the next 4-5 days if continued  Height: 4\' 11"  (149.9 cm) Weight: 65.5 kg (144 lb 6.4 oz) IBW/kg (Calculated) : 43.2  Temp (24hrs), Avg:101.2 F (38.4 C), Min:98.3 F (36.8 C), Max:102.9 F (39.4 C)  Recent Labs  Lab 06/20/20 1437 06/21/20 0323 06/21/20 0755 06/21/20 1114 06/21/20 1434 06/22/20 0359 06/22/20 0833  WBC 4.3 5.1 4.8  --   --  9.0  --   CREATININE 1.12* 1.15* 0.99  --  0.94 0.93  --   LATICACIDVEN 1.1  --  2.2* 2.6*  --   --  2.3*    Estimated Creatinine Clearance: 56.9 mL/min (by C-G formula based on SCr of 0.93 mg/dL).    Allergies  Allergen Reactions  . Mirtazapine Rash    And hives  . Clindamycin Hives  . Other Hives    Tylox   . Sulfonamide Derivatives Hives  . Lipitor [Atorvastatin] Other (See Comments)    MUSCLE ACHES  . Restasis [Cyclosporine]     Pt stated, "My eyes turned red on the inside and outside of eye; burning sensation"  . Topamax [Topiramate] Itching    Antimicrobials this admission: 1/11 cefepime >>  1/11 vancomycin >>   Dose adjustments this admission: None   Microbiology results: 1/11 Bcx x 1 set: NGTD 1/12 MRSA PCR: neg  Thank you for allowing  pharmacy to be a part of this patient's care.  Doreene Eland, PharmD, BCPS.   Work Cell: [phone] 06/22/2020 11:36 AM

## 2020-06-22 NOTE — Progress Notes (Signed)
PROGRESS NOTE    Ashley [name]  [id number] DOB: 09-22-1965 DOA: 06/20/2020 PCP: Prince Solian, MD   Brief Narrative:  55 year old female extensive past medical history as per medical record who presents for lethargy, subjective fevers at home, decreased level of consciousness.  Found to have multifocal pneumonia in the setting of COVID-19 infection.  Also with elevated procalcitonin concerning for bacterial pulm infection.  Also presented with DKA/HHS.  She has been on intravenous insulin infusion via Endo tool protocol.  Anion gap closed.  Glycemic control improved.  Will transition off of IV insulin.  Patient has been altered and tachypneic.  New oxygen requirement.  Likely fluid overload in the setting of aggressive fluid resuscitation.  We will hold all IV fluids at this time.  Transition off IV insulin.  Advance diet as tolerated.  Continue remdesivir for COVID-pneumonia.  Continue broad-spectrum antibiotics for suspected bacterial coinfection  Was evaluated in the stepdown unit.  Remains febrile, responsive to Tylenol or Toradol.  Tolerating p.o.  Mental status appears to be slowly improving.  MRI reassuring.   Assessment & Plan:   Principal Problem:   DKA (diabetic ketoacidosis) ([place]) Active Problems:   Gastroparesis   Moderate persistent asthma   OSA on CPAP   Essential hypertension   Peripheral arterial disease (HCC)   Hypothyroidism   Rheumatoid arthritis (HCC)   Pneumonia due to COVID-19 virus  Multifocal pneumonia secondary to COVID-19 Suspected bacterial coinfection Patient tested +1 to 2 weeks prior to admission Per her husband she has been altered for 6 to 8 days prior to coming in Endorsing shortness of breath, fever, body aches, generalized malaise Chest CT significant for multifocal infiltrates, negative for PE Plan: Continue remdesivir, day 2/5 Start empiric Solu-Medrol, 40 mg IV twice daily Continue empiric broad-spectrum antibiotics vancomycin and  Zosyn Bronchodilator Antitussives as needed Supplemental oxygen, wean as tolerated Lasix 40 mg IV x1, reassess daily for diuretic needed tolerance  Acute metabolic encephalopathy Unclear etiology Possibly COVID encephalopathy MRI brain reassuring Patient continues to spike fevers Mental status improving Plan: Continue COVID treatment as above Frequent reorienting measures Monitor mental status carefully If acute change will reinvolve neurology and consider possible LP  Hyperglycemic hyperosmolar state/diabetic ketoacidosis, resolved Insulin-dependent diabetes mellitus with hyperglycemia Patient on insulin pump Presented with markedly elevated blood glucose Insulin pump has been stopped Anion gap closed after Endo tool protocol Plan: Subcutaneous basal bolus regimen Carb modified diet Diabetes coordinator consult  Gastroparesis Not appears acutely active.  No vomiting  Moderate persistent asthma Continue home inhalers with albuterol as needed  OSA on CPAP Nighttime CPAP prn  Essential hypertension Continue home antihypertensives  Peripheral arterial disease (HCC) Continue antiplatelets and statins  Hypothyroidism Continue levothyroxine   DVT prophylaxis: SQ Lovenox Code Status: Full Family Communication: Spoke with husband Barbaraann Rondo [phone] on 1/11.  Left voicemail on 1/12 Disposition Plan: Status is: Inpatient  Remains inpatient appropriate because:Inpatient level of care appropriate due to severity of illness   Dispo: The patient is from: Home              Anticipated d/c is to: Home              Anticipated d/c date is: > 3 days              Patient currently is not medically stable to d/c.  Remains hypoxic, encephalopathic, febrile in the setting of COVID-19 pneumonia and possible bacterial coinfection.  Several days prior to disposition planning.  Consultants:   None  Procedures:   Done  Antimicrobials:    Remdesivir  Vancomycin  Zosyn   Subjective: Seen and examined.  Remains lethargic and somewhat tremulous but mentating clearly.  Mental status appears proved reasonable.  Objective: Vitals:   06/22/20 0900 06/22/20 0940 06/22/20 1000 06/22/20 1100  BP: 137/71  140/66 138/68  Pulse: (!) 144  (!) 137 (!) 132  Resp: (!) 44  (!) 40 (!) 44  Temp:    98.3 F (36.8 C)  TempSrc:    Oral  SpO2: 92% 92% 91% 92%  Weight:      Height:        Intake/Output Summary (Last 24 hours) at 06/22/2020 1246 Last data filed at 06/22/2020 1155 Gross per 24 hour  Intake 63 ml  Output 1000 ml  Net -937 ml   Filed Weights   06/20/20 1432 06/22/20 0622  Weight: 67.6 kg 65.5 kg    Examination:  General exam: Tremulous, mild distress Respiratory system: Coarse crackles bilaterally.  Normal work of breathing.  Tachypneic.  8 L Cardiovascular system: Tachycardic, regular rhythm, no murmurs Gastrointestinal system: Abdomen is nondistended, soft and nontender. No organomegaly or masses felt. Normal bowel sounds heard. Central nervous system: Alert and oriented. No focal neurological deficits. Extremities: Symmetric 5 x 5 power. Skin: No rashes, lesions or ulcers Psychiatry: Judgement and insight appear impaired. Mood & affect flattened.     Data Reviewed: I have personally reviewed following labs and imaging studies  CBC: Recent Labs  Lab 06/20/20 1437 06/21/20 0323 06/21/20 0755 06/22/20 0359  WBC 4.3 5.1 4.8 9.0  NEUTROABS 3.7  --  3.6 7.3  HGB 13.1 12.1 10.6* 12.8  HCT 38.6 35.9* 30.7* 37.2  MCV 89.6 91.6 90.0 91.0  PLT 173 144* 132* [id number]   Basic Metabolic Panel: Recent Labs  Lab 06/20/20 1437 06/21/20 0323 06/21/20 0755 06/21/20 1434 06/22/20 0359  NA 131*  --  137 140 145  K 3.4*  --  3.5 3.5 3.5  CL 92*  --  102 105 109  CO2 19*  --  20* 23 26  GLUCOSE 290*  --  268* 192* 260*  BUN 22*  --  16 16 21*  CREATININE 1.12* 1.15* 0.99 0.94 0.93  CALCIUM 8.9  --  8.0*  8.1* 8.3*  MG  --   --   --   --  2.2   GFR: Estimated Creatinine Clearance: 56.9 mL/min (by C-G formula based on SCr of 0.93 mg/dL). Liver Function Tests: Recent Labs  Lab 06/20/20 1437 06/22/20 0359  AST 75* 86*  ALT 44 41  ALKPHOS 59 60  BILITOT 1.4* 0.9  PROT 7.7 6.6  ALBUMIN 4.3 3.3*   No results for input(s): LIPASE, AMYLASE in the last 168 hours. No results for input(s): AMMONIA in the last 168 hours. Coagulation Profile: No results for input(s): INR, PROTIME in the last 168 hours. Cardiac Enzymes: No results for input(s): CKTOTAL, CKMB, CKMBINDEX, TROPONINI in the last 168 hours. BNP (last 3 results) No results for input(s): PROBNP in the last 8760 hours. HbA1C: Recent Labs    06/21/20 0323  HGBA1C 8.2*   CBG: Recent Labs  Lab 06/21/20 2347 06/22/20 0349 06/22/20 0621 06/22/20 0737 06/22/20 1138  GLUCAP 288* 234* 260* 279* 364*   Lipid Profile: No results for input(s): CHOL, HDL, LDLCALC, TRIG, CHOLHDL, LDLDIRECT in the last 72 hours. Thyroid Function Tests: No results for input(s): TSH, T4TOTAL, FREET4, T3FREE, THYROIDAB in the last 72 hours. Anemia Panel: Recent Labs  06/21/20 0046  FERRITIN 877*   Sepsis Labs: Recent Labs  Lab 06/20/20 1437 06/21/20 0046 06/21/20 0755 06/21/20 1114 06/22/20 0833  PROCALCITON  --  1.43  --   --   --   LATICACIDVEN 1.1  --  2.2* 2.6* 2.3*    Recent Results (from the past 240 hour(s))  Resp Panel by RT-PCR (Flu A&B, Covid) Nasopharyngeal Swab     Status: Abnormal   Collection Time: 06/21/20  3:28 AM   Specimen: Nasopharyngeal Swab; Nasopharyngeal(NP) swabs in vial transport medium  Result Value Ref Range Status   SARS Coronavirus 2 by RT PCR POSITIVE (A) NEGATIVE Final    Comment: RESULT CALLED TO, READ BACK BY AND VERIFIED WITH:  RAQUEL DAVID AT 0508 06/21/20 SDR (NOTE) SARS-CoV-2 target nucleic acids are DETECTED.  The SARS-CoV-2 RNA is generally detectable in upper respiratory specimens during the  acute phase of infection. Positive results are indicative of the presence of the identified virus, but do not rule out bacterial infection or co-infection with other pathogens not detected by the test. Clinical correlation with patient history and other diagnostic information is necessary to determine patient infection status. The expected result is Negative.  Fact Sheet for Patients: EntrepreneurPulse.com.au  Fact Sheet for Healthcare Providers: IncredibleEmployment.be  This test is not yet approved or cleared by the [place] FDA and  has been authorized for detection and/or diagnosis of SARS-CoV-2 by FDA under an Emergency Use Authorization (EUA).  This EUA will remain in effect (meaning this test can be  used) for the duration of  the COVID-19 declaration under Section 564(b)(1) of the Act, 21 U.S.C. section 360bbb-3(b)(1), unless the authorization is terminated or revoked sooner.     Influenza A by PCR NEGATIVE NEGATIVE Final   Influenza B by PCR NEGATIVE NEGATIVE Final    Comment: (NOTE) The Xpert Xpress SARS-CoV-2/FLU/RSV plus assay is intended as an aid in the diagnosis of influenza from Nasopharyngeal swab specimens and should not be used as a sole basis for treatment. Nasal washings and aspirates are unacceptable for Xpert Xpress SARS-CoV-2/FLU/RSV testing.  Fact Sheet for Patients: EntrepreneurPulse.com.au  Fact Sheet for Healthcare Providers: IncredibleEmployment.be  This test is not yet approved or cleared by the [place] FDA and has been authorized for detection and/or diagnosis of SARS-CoV-2 by FDA under an Emergency Use Authorization (EUA). This EUA will remain in effect (meaning this test can be used) for the duration of the COVID-19 declaration under Section 564(b)(1) of the Act, 21 U.S.C. section 360bbb-3(b)(1), unless the authorization is terminated or revoked.  Performed at  [facility], [address] [place], [place] [address]   CULTURE, BLOOD (ROUTINE X 2) w Reflex to ID Panel     Status: None (Preliminary result)   Collection Time: 06/21/20 11:14 AM   Specimen: BLOOD  Result Value Ref Range Status   Specimen Description BLOOD LEFT ARM  Final   Special Requests   Final    BOTTLES DRAWN AEROBIC AND ANAEROBIC Blood Culture adequate volume   Culture   Final    NO GROWTH < 24 HOURS Performed at [facility], [address] [place], [place] [address]    Report Status PENDING  Incomplete  MRSA PCR Screening     Status: None   Collection Time: 06/22/20  6:40 AM   Specimen: Nasal Mucosa; Nasopharyngeal  Result Value Ref Range Status   MRSA by PCR NEGATIVE NEGATIVE Final    Comment:  The GeneXpert MRSA Assay (FDA approved for NASAL specimens only), is one component of a comprehensive MRSA colonization surveillance program. It is not intended to diagnose MRSA infection nor to guide or monitor treatment for MRSA infections. Performed at [facility], [address] [place], [place] [address]          Radiology Studies: DG Chest 2 View  Result Date: 06/20/2020 CLINICAL DATA:  Shortness of breath, COVID positive. Fever and body aches. EXAM: CHEST - 2 VIEW COMPARISON:  05/05/2020 chest x-ray and CT chest. FINDINGS: Patient is slightly rotated. Trachea is midline. Heart size normal. Basilar dependent peribronchovascular airspace opacities. No pleural fluid. IMPRESSION: COVID-19 pneumonia. Electronically Signed   By: Lorin Picket M.D.   On: 06/20/2020 15:35   CT HEAD WO CONTRAST  Result Date: 06/21/2020 CLINICAL DATA:  Altered mental status. EXAM: CT HEAD WITHOUT CONTRAST TECHNIQUE: Contiguous axial images were obtained from the base of the skull through the vertex without intravenous contrast. COMPARISON:  May 05, 2020. FINDINGS: Brain: Mild chronic ischemic white matter disease is noted. No mass effect or  midline shift is noted. Ventricular size is within normal limits. There is no evidence of mass lesion, hemorrhage or acute infarction. Vascular: No hyperdense vessel or unexpected calcification. Skull: Normal. Negative for fracture or focal lesion. Sinuses/Orbits: No acute finding. Other: None. IMPRESSION: Mild chronic ischemic white matter disease. No acute intracranial abnormality seen. Electronically Signed   By: Marijo Conception M.D.   On: 06/21/2020 12:29   CT ANGIO CHEST PE W OR WO CONTRAST  Result Date: 06/21/2020 CLINICAL DATA:  Suspected pulmonary embolism in a patient with lethargy and decreased level of consciousness recently shown to be positive for COVID 19 infection EXAM: CT ANGIOGRAPHY CHEST WITH CONTRAST TECHNIQUE: Multidetector CT imaging of the chest was performed using the standard protocol during bolus administration of intravenous contrast. Multiplanar CT image reconstructions and MIPs were obtained to evaluate the vascular anatomy. CONTRAST:  37mL OMNIPAQUE IOHEXOL 350 MG/ML SOLN COMPARISON:  05/05/2020 FINDINGS: Cardiovascular: Normal caliber thoracic aorta. Calcified and noncalcified atheromatous plaque in the thoracic aorta. Heart size is normal without pericardial effusion. Lung base assessment markedly limited by respiratory motion. Extensive motion artifact limits assessment without signs of central or lobar level pulmonary embolism. Mediastinum/Nodes: Scattered lymph nodes throughout the chest none with pathologic enlargement, increased size since previous imaging. Fullness of bilateral hilar nodal tissue. Mildly patulous appearance of the esophagus. No thoracic inlet adenopathy or axillary adenopathy. Lungs/Pleura: Diffuse areas of consolidative change, nodularity and ground-glass. No sign of pleural effusion. Findings show midlung and basilar predominance. Airways are patent. Upper Abdomen: Imaged portions of liver, spleen, pancreas, adrenal glands and kidneys without signs of acute  process. Assessment of gastrointestinal structures limited as well based on coverage. Musculoskeletal: No acute bone finding or destructive bone process. Levoconvex scoliotic curvature in the upper thoracic spine as on the prior study. Review of the MIP images confirms the above findings. IMPRESSION: 1. Extensive motion artifact limits assessment without signs of central or lobar level pulmonary embolism. 2. Diffuse areas of consolidative change, nodularity and ground-glass. Findings show midlung and basilar predominance. Findings of multifocal pneumonia in the setting of COVID-19 infection. 3. Mild mediastinal and hilar nodal enlargement likely reactive. 4. Aortic atherosclerosis. Aortic Atherosclerosis (ICD10-I70.0). Electronically Signed   By: Zetta Bills M.D.   On: 06/21/2020 16:30   MR BRAIN W WO CONTRAST  Result Date: 06/21/2020 CLINICAL DATA:  Mental status change.  COVID-19 positive EXAM: MRI HEAD WITHOUT  AND WITH CONTRAST TECHNIQUE: Multiplanar, multiecho pulse sequences of the brain and surrounding structures were obtained without and with intravenous contrast. CONTRAST:  39mL GADAVIST GADOBUTROL 1 MMOL/ML IV SOLN COMPARISON:  CT head 06/21/2020.  MRI head 05/25/2009 FINDINGS: Brain: Negative for acute infarct. Mild atrophy with progression since 2010. Mild white matter changes with scattered subcortical white matter hyperintensities most prominent in the right parietal lobe. This was present previously and is unchanged from 2010. Negative for hemorrhage or mass. Normal enhancement postcontrast administration. Postcontrast images degraded by motion. Vascular: Normal arterial flow voids Skull and upper cervical spine: No focal skeletal lesion. Sinuses/Orbits: Mild mucosal edema paranasal sinuses. Bilateral cataract extraction Other: None IMPRESSION: No acute abnormality. Mild white matter changes similar to 2010. Mild atrophy with progression since 2010. Electronically Signed   By: Franchot Gallo M.D.    On: 06/21/2020 21:01   DG Chest Port 1 View  Result Date: 06/21/2020 CLINICAL DATA:  Weakness.  Recent COVID-19 positive EXAM: PORTABLE CHEST 1 VIEW COMPARISON:  June 20, 2020 FINDINGS: There are now multiple areas of airspace opacity throughout the lung bases as well as in the right upper lobe and to a lesser extent left upper lobe and mid lung regions. Heart is upper normal in size with pulmonary vascularity normal. No adenopathy. No bone lesions. IMPRESSION: Multiple foci of airspace opacity consistent with multifocal pneumonia, likely of atypical organism etiology. There has been significant increase in areas of opacity compared to 1 day prior. Heart upper normal in size. No adenopathy evident. Electronically Signed   By: Lowella Grip III M.D.   On: 06/21/2020 14:42        Scheduled Meds: . vitamin C  500 mg Oral Daily  . Chlorhexidine Gluconate Cloth  6 each Topical Daily  . enoxaparin (LOVENOX) injection  40 mg Subcutaneous Q24H  . insulin aspart  0-9 Units Subcutaneous Q4H  . insulin glargine  26 Units Subcutaneous Q24H  . methylPREDNISolone (SOLU-MEDROL) injection  40 mg Intravenous Q12H  . zinc sulfate  220 mg Oral Daily   Continuous Infusions: . ceFEPime (MAXIPIME) IV    . remdesivir 100 mg in NS 100 mL 100 mg (06/22/20 0941)  . vancomycin       LOS: 1 day    Time spent: 25 minutes    Sidney Ace, MD [facility] Pager [phone]  If 7PM-7AM, please contact night-coverage 06/22/2020, 12:46 PM

## 2020-06-22 NOTE — Progress Notes (Signed)
Inpatient Diabetes Program Recommendations  AACE/ADA: New Consensus Statement on Inpatient Glycemic Control   Target Ranges:  Prepandial:   less than 140 mg/dL      Peak postprandial:   less than 180 mg/dL (1-2 hours)      Critically ill patients:  140 - 180 mg/dL  Results for RIELLY, BRUNN (MRN [id number]) as of 06/22/2020 07:26  Ref. Range 06/22/2020 03:49  Glucose-Capillary Latest Ref Range: 70 - 99 mg/dL 234 (H)  Novolog 3 units   Results for ANYLA, ISRAELSON (MRN [id number]) as of 06/22/2020 07:26  Ref. Range 06/21/2020 08:30 06/21/2020 10:30 06/21/2020 11:38 06/21/2020 12:29 06/21/2020 13:45 06/21/2020 14:50 06/21/2020 16:01 06/21/2020 16:25 06/21/2020 17:22 06/21/2020 19:36 06/21/2020 23:47  Glucose-Capillary Latest Ref Range: 70 - 99 mg/dL 287 (H) 169 (H) 266 (H) 186 (H) 184 (H) 181 (H) 153 (H)     Lantus 20 units 138 (H)  Novolog 1 units 228 (H)  Novolog 3 units 288 (H)  Novolog 5 units   Review of Glycemic Control  Diabetes history: DM1 (makes NO insulin; requires basal, correction, and carb coverage insulin) Outpatient Diabetes medications: Insulin Pump with Novolog Current orders for Inpatient glycemic control: Lantus 20 units Q24H, Novolog 0-9 units Q4H  Inpatient Diabetes Program Recommendations:    Insulin: Please consider increasing Lantus to 26 units Q24H and adding Novolog 4 units TID with meals for meal coverage if patient eats at least 50% of meals.  Thanks, Barnie Alderman, RN, MSN, CDE Diabetes Coordinator Inpatient Diabetes Program [phone] (Team Pager from 8am to 5pm)

## 2020-06-22 NOTE — ED Notes (Signed)
Lab contacted this RN to notify that blue top for d dimer was hemolyzed. RN notified lab they would need to complete the recollect due to limited access.

## 2020-06-22 NOTE — ED Notes (Signed)
Call received from telesitter to notify RN that pt had removed mitten and arm brace off of rt arm. RN reapplied slint and repositioned pt. Pt reported being cold, RN provided warm blankets at this time.

## 2020-06-22 NOTE — ED Notes (Signed)
telesitter with pt.  Pt sleeping.

## 2020-06-22 NOTE — ED Notes (Signed)
RT at bedside to place pt on high flow with humidity at this time. RR elevated and oxygen saturation is between 91-93% on 6L at this time.

## 2020-06-22 NOTE — ED Notes (Signed)
MD Mansy notified pt on 2L and RN verified humidification needs to be added to pt. MD states that if needed RT can increase oxygen flow rate in needed to maintain saturation above 92%

## 2020-06-23 ENCOUNTER — Inpatient Hospital Stay: Payer: Medicare HMO

## 2020-06-23 ENCOUNTER — Inpatient Hospital Stay: Payer: Self-pay

## 2020-06-23 DIAGNOSIS — U071 COVID-19: Secondary | ICD-10-CM | POA: Diagnosis not present

## 2020-06-23 DIAGNOSIS — J80 Acute respiratory distress syndrome: Secondary | ICD-10-CM

## 2020-06-23 DIAGNOSIS — J1282 Pneumonia due to coronavirus disease 2019: Secondary | ICD-10-CM

## 2020-06-23 DIAGNOSIS — J9601 Acute respiratory failure with hypoxia: Secondary | ICD-10-CM

## 2020-06-23 DIAGNOSIS — E0811 Diabetes mellitus due to underlying condition with ketoacidosis with coma: Secondary | ICD-10-CM

## 2020-06-23 DIAGNOSIS — K3184 Gastroparesis: Secondary | ICD-10-CM

## 2020-06-23 DIAGNOSIS — E0810 Diabetes mellitus due to underlying condition with ketoacidosis without coma: Secondary | ICD-10-CM | POA: Diagnosis not present

## 2020-06-23 LAB — CBC WITH DIFFERENTIAL/PLATELET
Abs Immature Granulocytes: 0.09 10*3/uL — ABNORMAL HIGH (ref 0.00–0.07)
Basophils Absolute: 0 10*3/uL (ref 0.0–0.1)
Basophils Relative: 0 %
Eosinophils Absolute: 0 10*3/uL (ref 0.0–0.5)
Eosinophils Relative: 0 %
HCT: 37.7 % (ref 36.0–46.0)
Hemoglobin: 12.6 g/dL (ref 12.0–15.0)
Immature Granulocytes: 1 %
Lymphocytes Relative: 12 %
Lymphs Abs: 0.9 10*3/uL (ref 0.7–4.0)
MCH: 30.6 pg (ref 26.0–34.0)
MCHC: 33.4 g/dL (ref 30.0–36.0)
MCV: 91.5 fL (ref 80.0–100.0)
Monocytes Absolute: 0.5 10*3/uL (ref 0.1–1.0)
Monocytes Relative: 7 %
Neutro Abs: 5.8 10*3/uL (ref 1.7–7.7)
Neutrophils Relative %: 80 %
Platelets: 196 10*3/uL (ref 150–400)
RBC: 4.12 MIL/uL (ref 3.87–5.11)
RDW: 15.4 % (ref 11.5–15.5)
WBC: 7.2 10*3/uL (ref 4.0–10.5)
nRBC: 0 % (ref 0.0–0.2)

## 2020-06-23 LAB — AMMONIA: Ammonia: 24 umol/L (ref 9–35)

## 2020-06-23 LAB — MAGNESIUM: Magnesium: 2.6 mg/dL — ABNORMAL HIGH (ref 1.7–2.4)

## 2020-06-23 LAB — GLUCOSE, CAPILLARY
Glucose-Capillary: 102 mg/dL — ABNORMAL HIGH (ref 70–99)
Glucose-Capillary: 111 mg/dL — ABNORMAL HIGH (ref 70–99)
Glucose-Capillary: 132 mg/dL — ABNORMAL HIGH (ref 70–99)
Glucose-Capillary: 147 mg/dL — ABNORMAL HIGH (ref 70–99)
Glucose-Capillary: 169 mg/dL — ABNORMAL HIGH (ref 70–99)
Glucose-Capillary: 172 mg/dL — ABNORMAL HIGH (ref 70–99)
Glucose-Capillary: 215 mg/dL — ABNORMAL HIGH (ref 70–99)
Glucose-Capillary: 281 mg/dL — ABNORMAL HIGH (ref 70–99)
Glucose-Capillary: 285 mg/dL — ABNORMAL HIGH (ref 70–99)
Glucose-Capillary: 292 mg/dL — ABNORMAL HIGH (ref 70–99)
Glucose-Capillary: 335 mg/dL — ABNORMAL HIGH (ref 70–99)
Glucose-Capillary: 377 mg/dL — ABNORMAL HIGH (ref 70–99)
Glucose-Capillary: 69 mg/dL — ABNORMAL LOW (ref 70–99)
Glucose-Capillary: 78 mg/dL (ref 70–99)

## 2020-06-23 LAB — COMPREHENSIVE METABOLIC PANEL
ALT: 35 U/L (ref 0–44)
AST: 57 U/L — ABNORMAL HIGH (ref 15–41)
Albumin: 3.3 g/dL — ABNORMAL LOW (ref 3.5–5.0)
Alkaline Phosphatase: 65 U/L (ref 38–126)
Anion gap: 12 (ref 5–15)
BUN: 30 mg/dL — ABNORMAL HIGH (ref 6–20)
CO2: 27 mmol/L (ref 22–32)
Calcium: 8.7 mg/dL — ABNORMAL LOW (ref 8.9–10.3)
Chloride: 113 mmol/L — ABNORMAL HIGH (ref 98–111)
Creatinine, Ser: 0.95 mg/dL (ref 0.44–1.00)
GFR, Estimated: >60 mL/min (ref >60)
Glucose, Bld: 211 mg/dL — ABNORMAL HIGH (ref 70–99)
Potassium: 3.4 mmol/L — ABNORMAL LOW (ref 3.5–5.1)
Sodium: 152 mmol/L — ABNORMAL HIGH (ref 135–145)
Total Bilirubin: 1.1 mg/dL (ref 0.3–1.2)
Total Protein: 6.8 g/dL (ref 6.5–8.1)

## 2020-06-23 LAB — C-REACTIVE PROTEIN: CRP: 12.3 mg/dL — ABNORMAL HIGH (ref <1.0)

## 2020-06-23 LAB — FIBRIN DERIVATIVES D-DIMER (ARMC ONLY): Fibrin derivatives D-dimer (ARMC): 1389.88 ng/mL (FEU) — ABNORMAL HIGH (ref 0.00–499.00)

## 2020-06-23 MED ORDER — VECURONIUM BROMIDE 10 MG IV SOLR: 10.0000 mg | Freq: Once | INTRAVENOUS | Status: AC

## 2020-06-23 MED ORDER — FREE WATER
100.0000 mL | Freq: Four times a day (QID) | Status: DC
  Administered 2020-06-23 – 2020-06-24 (×3): 100 mL

## 2020-06-23 MED ORDER — METOPROLOL TARTRATE 25 MG PO TABS
12.5000 mg | ORAL_TABLET | Freq: Two times a day (BID) | ORAL | Status: DC
  Administered 2020-06-23 – 2020-06-24 (×3): 12.5 mg via ORAL
  Filled 2020-06-23 (×3): qty 1

## 2020-06-23 MED ORDER — INSULIN REGULAR(HUMAN) IN NACL 100-0.9 UT/100ML-% IV SOLN
INTRAVENOUS | Status: DC
  Administered 2020-06-23: 14 [IU]/h via INTRAVENOUS
  Filled 2020-06-23: qty 100

## 2020-06-23 MED ORDER — FENTANYL CITRATE (PF) 100 MCG/2ML IJ SOLN: 200.0000 ug | Freq: Once | INTRAMUSCULAR | Status: AC

## 2020-06-23 MED ORDER — SODIUM CHLORIDE 0.9% FLUSH
10.0000 mL | INTRAVENOUS | Status: DC | PRN
Start: 2020-06-23 — End: 2020-07-06
  Administered 2020-06-24: 10 mL

## 2020-06-23 MED ORDER — METOPROLOL TARTRATE 25 MG PO TABS: 25.0000 mg | ORAL_TABLET | Freq: Two times a day (BID) | ORAL | Status: DC

## 2020-06-23 MED ORDER — NOREPINEPHRINE 4 MG/250ML-% IV SOLN
0.0000 ug/min | INTRAVENOUS | Status: DC
  Administered 2020-06-24: 6 ug/min via INTRAVENOUS
  Administered 2020-06-24: 4 ug/min via INTRAVENOUS
  Administered 2020-06-25: 2 ug/min via INTRAVENOUS
  Filled 2020-06-23 (×4): qty 250

## 2020-06-23 MED ORDER — DEXTROSE 5 % IV SOLN: INTRAVENOUS | Status: DC

## 2020-06-23 MED ORDER — NOREPINEPHRINE 4 MG/250ML-% IV SOLN
INTRAVENOUS | Status: AC
  Administered 2020-06-23: 5 ug/min via INTRAVENOUS
  Filled 2020-06-23: qty 250

## 2020-06-23 MED ORDER — FUROSEMIDE 10 MG/ML IJ SOLN
60.0000 mg | Freq: Once | INTRAMUSCULAR | Status: AC
  Administered 2020-06-23: 60 mg via INTRAVENOUS
  Filled 2020-06-23: qty 6

## 2020-06-23 MED ORDER — INSULIN GLARGINE 100 UNIT/ML ~~LOC~~ SOLN
30.0000 [IU] | Freq: Every day | SUBCUTANEOUS | Status: DC
  Administered 2020-06-23 – 2020-06-29 (×7): 30 [IU] via SUBCUTANEOUS
  Filled 2020-06-23 (×10): qty 0.3

## 2020-06-23 MED ORDER — ROCURONIUM BROMIDE 50 MG/5ML IV SOLN
INTRAVENOUS | Status: AC
  Filled 2020-06-23: qty 1

## 2020-06-23 MED ORDER — INSULIN ASPART 100 UNIT/ML ~~LOC~~ SOLN
0.0000 [IU] | SUBCUTANEOUS | Status: DC
  Administered 2020-06-23: 20 [IU] via SUBCUTANEOUS
  Filled 2020-06-23: qty 1

## 2020-06-23 MED ORDER — SODIUM CHLORIDE 0.9% FLUSH
10.0000 mL | Freq: Two times a day (BID) | INTRAVENOUS | Status: DC
  Administered 2020-06-23 – 2020-07-02 (×19): 10 mL
  Administered 2020-07-03: 20 mL
  Administered 2020-07-03: 22:00:00 10 mL
  Administered 2020-07-04: 20 mL
  Administered 2020-07-04 – 2020-07-06 (×4): 10 mL

## 2020-06-23 MED ORDER — INSULIN ASPART 100 UNIT/ML ~~LOC~~ SOLN
0.0000 [IU] | SUBCUTANEOUS | Status: DC
  Administered 2020-06-23: 2 [IU] via SUBCUTANEOUS
  Administered 2020-06-24: 5 [IU] via SUBCUTANEOUS
  Administered 2020-06-24 (×2): 8 [IU] via SUBCUTANEOUS
  Administered 2020-06-24 – 2020-06-25 (×2): 5 [IU] via SUBCUTANEOUS
  Administered 2020-06-25: 3 [IU] via SUBCUTANEOUS
  Administered 2020-06-25 (×2): 5 [IU] via SUBCUTANEOUS
  Administered 2020-06-25: 3 [IU] via SUBCUTANEOUS
  Administered 2020-06-25: 8 [IU] via SUBCUTANEOUS
  Administered 2020-06-26: 5 [IU] via SUBCUTANEOUS
  Administered 2020-06-26: 3 [IU] via SUBCUTANEOUS
  Administered 2020-06-26 – 2020-06-27 (×5): 5 [IU] via SUBCUTANEOUS
  Administered 2020-06-27: 2 [IU] via SUBCUTANEOUS
  Administered 2020-06-27: 5 [IU] via SUBCUTANEOUS
  Administered 2020-06-27: 8 [IU] via SUBCUTANEOUS
  Administered 2020-06-27 – 2020-06-28 (×3): 5 [IU] via SUBCUTANEOUS
  Administered 2020-06-28: 3 [IU] via SUBCUTANEOUS
  Administered 2020-06-28: 2 [IU] via SUBCUTANEOUS
  Administered 2020-06-28 (×2): 5 [IU] via SUBCUTANEOUS
  Administered 2020-06-29 (×2): 3 [IU] via SUBCUTANEOUS
  Administered 2020-06-29 (×2): 5 [IU] via SUBCUTANEOUS
  Administered 2020-06-29: 8 [IU] via SUBCUTANEOUS
  Administered 2020-06-29 – 2020-06-30 (×4): 3 [IU] via SUBCUTANEOUS
  Administered 2020-06-30: 5 [IU] via SUBCUTANEOUS
  Administered 2020-07-01: 8 [IU] via SUBCUTANEOUS
  Administered 2020-07-01: 2 [IU] via SUBCUTANEOUS
  Administered 2020-07-01: 8 [IU] via SUBCUTANEOUS
  Administered 2020-07-01 (×2): 2 [IU] via SUBCUTANEOUS
  Administered 2020-07-01: 5 [IU] via SUBCUTANEOUS
  Administered 2020-07-02: 2 [IU] via SUBCUTANEOUS
  Administered 2020-07-02: 15 [IU] via SUBCUTANEOUS
  Administered 2020-07-02 (×3): 5 [IU] via SUBCUTANEOUS
  Administered 2020-07-02: 11 [IU] via SUBCUTANEOUS
  Administered 2020-07-03: 8 [IU] via SUBCUTANEOUS
  Administered 2020-07-03: 3 [IU] via SUBCUTANEOUS
  Administered 2020-07-03 (×3): 2 [IU] via SUBCUTANEOUS
  Administered 2020-07-04: 5 [IU] via SUBCUTANEOUS
  Administered 2020-07-04 (×2): 2 [IU] via SUBCUTANEOUS
  Administered 2020-07-04: 8 [IU] via SUBCUTANEOUS
  Administered 2020-07-04: 13:00:00 3 [IU] via SUBCUTANEOUS
  Administered 2020-07-05: 15 [IU] via SUBCUTANEOUS
  Administered 2020-07-05 (×2): 8 [IU] via SUBCUTANEOUS
  Administered 2020-07-05 (×3): 3 [IU] via SUBCUTANEOUS
  Administered 2020-07-06: 15 [IU] via SUBCUTANEOUS
  Administered 2020-07-06: 3 [IU] via SUBCUTANEOUS
  Administered 2020-07-06: 10:00:00 5 [IU] via SUBCUTANEOUS
  Filled 2020-06-23 (×72): qty 1

## 2020-06-23 MED ORDER — MIDAZOLAM HCL 2 MG/2ML IJ SOLN
INTRAMUSCULAR | Status: AC
  Administered 2020-06-23: 4 mg via INTRAVENOUS
  Filled 2020-06-23: qty 4

## 2020-06-23 MED ORDER — MIDAZOLAM 50MG/50ML (1MG/ML) PREMIX INFUSION
0.5000 mg/h | INTRAVENOUS | Status: DC
  Administered 2020-06-23: 0.5 mg/h via INTRAVENOUS
  Administered 2020-06-24: 2 mg/h via INTRAVENOUS
  Administered 2020-06-24 – 2020-06-25 (×2): 3 mg/h via INTRAVENOUS
  Administered 2020-06-26: 5 mg/h via INTRAVENOUS
  Administered 2020-06-27: 4 mg/h via INTRAVENOUS
  Filled 2020-06-23 (×6): qty 50

## 2020-06-23 MED ORDER — DEXMEDETOMIDINE HCL IN NACL 400 MCG/100ML IV SOLN
0.4000 ug/kg/h | INTRAVENOUS | Status: DC
  Administered 2020-06-23: 0.4 ug/kg/h via INTRAVENOUS
  Administered 2020-06-27: 0.8 ug/kg/h via INTRAVENOUS
  Administered 2020-06-27: 0.4 ug/kg/h via INTRAVENOUS
  Filled 2020-06-23 (×2): qty 100

## 2020-06-23 MED ORDER — DEXTROSE 50 % IV SOLN: 0.0000 mL | INTRAVENOUS | Status: DC | PRN

## 2020-06-23 MED ORDER — FENTANYL 2500MCG IN NS 250ML (10MCG/ML) PREMIX INFUSION
INTRAVENOUS | Status: AC
  Filled 2020-06-23: qty 250

## 2020-06-23 MED ORDER — LACTATED RINGERS IV BOLUS
500.0000 mL | Freq: Once | INTRAVENOUS | Status: AC
  Administered 2020-06-23: 500 mL via INTRAVENOUS

## 2020-06-23 MED ORDER — MIDAZOLAM HCL 2 MG/2ML IJ SOLN: 4.0000 mg | Freq: Once | INTRAMUSCULAR | Status: AC

## 2020-06-23 MED ORDER — FENTANYL CITRATE (PF) 100 MCG/2ML IJ SOLN
INTRAMUSCULAR | Status: AC
  Filled 2020-06-23: qty 2

## 2020-06-23 MED ORDER — PROPOFOL 1000 MG/100ML IV EMUL
INTRAVENOUS | Status: AC
  Filled 2020-06-23: qty 100

## 2020-06-23 MED ORDER — ETOMIDATE 2 MG/ML IV SOLN
INTRAVENOUS | Status: AC
  Filled 2020-06-23: qty 10

## 2020-06-23 MED ORDER — INSULIN ASPART 100 UNIT/ML ~~LOC~~ SOLN
4.0000 [IU] | Freq: Three times a day (TID) | SUBCUTANEOUS | Status: DC
  Administered 2020-06-23 (×2): 4 [IU] via SUBCUTANEOUS
  Filled 2020-06-23 (×2): qty 1

## 2020-06-23 MED ORDER — FENTANYL 2500MCG IN NS 250ML (10MCG/ML) PREMIX INFUSION
0.0000 ug/h | INTRAVENOUS | Status: DC
  Administered 2020-06-23: 50 ug/h via INTRAVENOUS
  Administered 2020-06-24 – 2020-06-25 (×2): 150 ug/h via INTRAVENOUS
  Administered 2020-06-25: 175 ug/h via INTRAVENOUS
  Administered 2020-06-26 – 2020-06-27 (×2): 200 ug/h via INTRAVENOUS
  Administered 2020-06-27: 75 ug/h via INTRAVENOUS
  Administered 2020-06-28 (×2): 200 ug/h via INTRAVENOUS
  Administered 2020-06-29: 50 ug/h via INTRAVENOUS
  Administered 2020-06-29: 350 ug/h via INTRAVENOUS
  Filled 2020-06-23 (×12): qty 250

## 2020-06-23 MED ORDER — FENTANYL CITRATE (PF) 100 MCG/2ML IJ SOLN
INTRAMUSCULAR | Status: AC
  Administered 2020-06-23: 200 ug via INTRAVENOUS
  Filled 2020-06-23: qty 4

## 2020-06-23 MED ORDER — AMPICILLIN-SULBACTAM SODIUM 3 (2-1) G IJ SOLR
3.0000 g | Freq: Four times a day (QID) | INTRAMUSCULAR | Status: AC
  Administered 2020-06-23 – 2020-06-29 (×24): 3 g via INTRAVENOUS
  Filled 2020-06-23: qty 8
  Filled 2020-06-23 (×3): qty 3
  Filled 2020-06-23: qty 8
  Filled 2020-06-23: qty 3
  Filled 2020-06-23: qty 8
  Filled 2020-06-23 (×4): qty 3
  Filled 2020-06-23 (×4): qty 8
  Filled 2020-06-23 (×3): qty 3
  Filled 2020-06-23: qty 8
  Filled 2020-06-23: qty 3
  Filled 2020-06-23 (×5): qty 8
  Filled 2020-06-23: qty 3

## 2020-06-23 MED ORDER — VECURONIUM BROMIDE 10 MG IV SOLR
INTRAVENOUS | Status: AC
  Administered 2020-06-23: 10 mg via INTRAVENOUS
  Filled 2020-06-23: qty 10

## 2020-06-23 MED ORDER — INSULIN GLARGINE 100 UNIT/ML ~~LOC~~ SOLN
30.0000 [IU] | SUBCUTANEOUS | Status: DC
  Administered 2020-06-23: 30 [IU] via SUBCUTANEOUS
  Filled 2020-06-23: qty 0.3

## 2020-06-23 NOTE — Progress Notes (Signed)
Patient with rectal temperature 100.9.  Tylenol 650 mg suppository administered.

## 2020-06-23 NOTE — Progress Notes (Signed)
Patient critically ill.  Hypoxic, encephalopathic, febrile in setting of COVID19 pneumonia.  DKA resolved, on SQ regimen.  Remains encephalopathic, tachypneic, increased agitation.  Discussed with PCCM.  Patient with severe respiratory distress and lung parenchymal damage.  Starting precedex gtt.  High risk for intubation.  PCCM team to assume primary care at this time. [facility] hospitalist service to sign off.  Please contact [facility] flow manager when patient stable for transfer to medical floor.  Ralene Muskrat MD  No charge

## 2020-06-23 NOTE — Procedures (Signed)
Endotracheal Intubation: Patient required placement of an artificial airway secondary to Respiratory Failure  Consent: Emergent.   Hand washing performed prior to starting the procedure.   Medications administered for sedation prior to procedure:  Midazolam 4 mg IV,  Vecuronium 20 mg IV, Fentanyl 200 mcg IV.    A time out procedure was called and correct patient, name, & ID confirmed. Needed supplies and equipment were assembled and checked to include ETT, 10 ml syringe, Glidescope, Mac and Miller blades, suction, oxygen and bag mask valve, end tidal CO2 monitor.   Patient was positioned to align the mouth and pharynx to facilitate visualization of the glottis.   Heart rate, SpO2 and blood pressure was continuously monitored during the procedure. Pre-oxygenation was conducted prior to intubation and endotracheal tube was placed through the vocal cords into the trachea.     The artificial airway was placed under direct visualization via glidescope route using a 8.5 ETT on the first attempt.  ETT was secured at 23 cm mark.  Placement was confirmed by auscuitation of lungs with good breath sounds bilaterally and no stomach sounds.  Condensation was noted on endotracheal tube.   Pulse ox 98%.  CO2 detector in place with appropriate color change.   Complications: None .   Operator: Ivana Nicastro.   Chest radiograph ordered and pending.   Comments: OGT placed via glidescope.  Corrin Parker, M.D.  [facility]  Medical Director [place] Director [facility] Cardio-Pulmonary Department

## 2020-06-23 NOTE — Progress Notes (Signed)
Inpatient Diabetes Program Recommendations  AACE/ADA: New Consensus Statement on Inpatient Glycemic Control   Target Ranges:  Prepandial:   less than 140 mg/dL      Peak postprandial:   less than 180 mg/dL (1-2 hours)      Critically ill patients:  140 - 180 mg/dL  Results for Ashley [name], Ashley [name] (MRN [id number]) as of 06/23/2020 07:06  Ref. Range 06/23/2020 02:22  Glucose Latest Ref Range: 70 - 99 mg/dL 211 (H)   Results for [name], [name] (MRN [id number]) as of 06/23/2020 07:06  Ref. Range 06/22/2020 06:21 06/22/2020 07:37 06/22/2020 11:38 06/22/2020 15:32 06/22/2020 23:43 06/23/2020 00:12  Glucose-Capillary Latest Ref Range: 70 - 99 mg/dL 260 (H) 279 (H) 364 (H) 337 (H) 171 (H) 169 (H)   Review of Glycemic Control  Diabetes history: DM1 (makes NO insulin; requires basal, correction, and carb coverage insulin) Outpatient Diabetes medications: Insulin Pump with Novolog Current orders for Inpatient glycemic control: Lantus 26 units Q24H, Novolog 0-15 units Q4H; Solumedrol 40 mg Q12H  Inpatient Diabetes Program Recommendations:    Insulin: If steroids are continued as ordered, please consider increasing Lantus to 30 units Q24H. Please consider ordering Novolog 4 units TID with meals for meal coverage if patient eats at least 50% of meals.  NOTE: Patient has Type1 DM so makes no insulin. If patient is eating, she requires insulin to cover carbohydrates. Patient is currently ordered steroids which are contributing to hyperglycemia.  Thanks, Barnie Alderman, RN, MSN, CDE Diabetes Coordinator Inpatient Diabetes Program [phone] (Team Pager from 8am to 5pm)

## 2020-06-23 NOTE — Progress Notes (Signed)
Peripherally Inserted Central Catheter Placement  The IV Nurse has discussed with the patient and/or persons authorized to consent for the patient, the purpose of this procedure and the potential benefits and risks involved with this procedure.  The benefits include less needle sticks, lab draws from the catheter, and the patient may be discharged home with the catheter. Risks include, but not limited to, infection, bleeding, blood clot (thrombus formation), and puncture of an artery; nerve damage and irregular heartbeat and possibility to perform a PICC exchange if needed/ordered by physician.  Alternatives to this procedure were also discussed.  Bard Power PICC patient education guide, fact sheet on infection prevention and patient information card has been provided to patient /or left at bedside.  Telephone consent obtained from spouse d/t sedation.  PICC Placement Documentation  PICC Double Lumen 30/16/01 PICC Right Basilic 35 cm 1 cm (Active)  Indication for Insertion or Continuance of Line Vasoactive infusions;Prolonged intravenous therapies 06/23/20 1640  Exposed Catheter (cm) 1 cm 06/23/20 1640  Site Assessment Clean;Dry;Intact 06/23/20 1640  Lumen #1 Status Flushed;Saline locked;Blood return noted 06/23/20 1640  Lumen #2 Status Flushed;Saline locked;Blood return noted 06/23/20 1640  Dressing Type Transparent 06/23/20 1640  Dressing Status Clean;Dry;Intact 06/23/20 1640  Antimicrobial disc in place? Yes 06/23/20 1640  Safety Lock Not Applicable 09/32/35 5732  Line Care Connections checked and tightened 06/23/20 1640  Line Adjustment (NICU/IV Team Only) No 06/23/20 1640  Dressing Intervention New dressing 06/23/20 1640  Dressing Change Due 06/30/20 06/23/20 1640       Rolena Infante 06/23/2020, 4:41 PM

## 2020-06-23 NOTE — Progress Notes (Signed)
Patient restless, agitated, pulling oxygen off face, confused with respiratory rate 30-32.   02 SATs dropping into 70's.  Dr. Mortimer Fries made aware.  Precedex to be sarted.

## 2020-06-23 NOTE — Progress Notes (Signed)
Chloe RN notified of PICC successful. Aware to remove all PIV's and change IV tubing as per protocol.

## 2020-06-23 NOTE — Progress Notes (Signed)
Patient intubated with 8.5 ETT, 22 at the lip.  Condensation noted in ETT, positive color change on c02 detector, equal bilateral breath sounds.

## 2020-06-23 NOTE — Consult Note (Signed)
NAME:  Ashley [name], MRN:  [id number], DOB:  16-Sep-1965, LOS: 2 ADMISSION DATE:  06/20/2020, CONSULTATION DATE:  06/23/2020 REFERRING MD:  Dr. Priscella Mann, CHIEF COMPLAINT:  Altered Mental Status, COVID-19 Pneumonia  Brief History:  55 y.o. Female admitted with Acute Metabolic Encephalopathy in the setting of COVID-19 Pneumonia & DKA/HHS.  History of Present Illness:  55 year old female extensive past medical history as per medical record who presents for lethargy, subjective fevers at home, decreased level of consciousness. Found to have multifocal pneumonia in the setting of COVID-19 infection. Also with elevated procalcitonin concerning for bacterial pulm infection. Also presented with DKA/HHS. She has been on intravenous insulin infusion via Endo tool protocol. Anion gap closed. Glycemic control improved. Will transition off of IV insulin.  Patient has been altered and tachypneic. New oxygen requirement. Patient with progressive hypoxia Increased agitation Patient with severe resp distress and lung damage   Past Medical History:   Active Ambulatory Problems    Diagnosis Date Noted  . DIAB W/O MENTION COMP TYPE I [JUV TYPE] UNCNTRL 02/28/2009  . GERD 02/28/2009  . Gastroparesis 03/22/2009  . DYSPHAGIA UNSPECIFIED 02/28/2009  . DYSPHAGIA 03/22/2009  . ABDOMINAL PAIN-EPIGASTRIC 02/28/2009  . Nausea with vomiting 10/22/2012  . Diarrhea 10/22/2012  . Dyspnea and respiratory abnormality 04/26/2015  . Chest tightness 04/26/2015  . Hoarseness of voice 04/26/2015  . OSA and COPD overlap syndrome ([place]) 04/02/2018  . Excessive daytime sleepiness 04/02/2018  . Delayed sleep phase syndrome 04/02/2018  . Moderate persistent asthma 04/02/2018  . Loud snoring 05/19/2018  . OSA on CPAP 10/08/2018  . Essential hypertension 12/25/2018  . Hyperlipidemia 12/25/2018  . Peripheral arterial disease ([place]) 12/25/2018  . Claudication in peripheral vascular disease ([place]) 01/01/2019  .  Tobacco abuse 02/24/2020  . Hypertensive disorder 12/09/2018   Resolved Ambulatory Problems    Diagnosis Date Noted  . No Resolved Ambulatory Problems   Past Medical History:  Diagnosis Date  . Anxiety   . Asthma   . Depression   . Diabetes mellitus ([place])   . Esophageal stricture   . GERD (gastroesophageal reflux disease)   . Glaucoma   . Graves disease 02/2010  . Helicobacter pylori infection   . Hypertension   . Hypothyroidism   . Insulin pump in place   . Nephrolithiasis 2010  . Neuropathy   . Osteoporosis   . Sleep apnea   . Sleep apnea with use of continuous positive airway pressure (CPAP)   . Vitamin D deficiency      Significant Hospital Events:  1/10: Presented to ED; Hospitalist admit to [facility] 1/13: PCCM consult  Consults:  Hospitalist (primary service) Neurology PCCM Diabetes Coordinator  Procedures:  N/A  Significant Diagnostic Tests:  1/11: CT Head w/o Contrast>>Mild chronic ischemic white matter disease. No acute intracranial abnormality seen. 1/11: CTA Chest>>1. Extensive motion artifact limits assessment without signs of central or lobar level pulmonary embolism. 2. Diffuse areas of consolidative change, nodularity and ground-glass. Findings show midlung and basilar predominance. Findings of multifocal pneumonia in the setting of COVID-19 infection. 3. Mild mediastinal and hilar nodal enlargement likely reactive. 4. Aortic atherosclerosis. 1/11: MR Brain w/o contrast>>No acute abnormality. Mild white matter changes similar to 2010. Mild atrophy with progression since 2010.  Micro Data:  1/11: SARS-CoV-2 PCR>> Positive 1/11: Influenza A&B PCR>>negative  Antimicrobials:   Anti-infectives (From admission, onward)   Start     Dose/Rate Route Frequency Ordered Stop   06/22/20 2200  ceFEPIme (MAXIPIME) 2 g in sodium chloride 0.9 %  100 mL IVPB        2 g 200 mL/hr over 30 Minutes Intravenous Every 8 hours 06/22/20 1138     06/22/20 2200   vancomycin (VANCOREADY) IVPB 750 mg/150 mL        750 mg 150 mL/hr over 60 Minutes Intravenous Every 12 hours 06/22/20 1138     06/22/20 1000  remdesivir 100 mg in sodium chloride 0.9 % 100 mL IVPB  Status:  Discontinued       "Followed by" Linked Group Details   100 mg 200 mL/hr over 30 Minutes Intravenous Daily 06/21/20 0126 06/21/20 0630   06/22/20 1000  vancomycin (VANCOCIN) IVPB 1000 mg/200 mL premix  Status:  Discontinued        1,000 mg 200 mL/hr over 60 Minutes Intravenous Every 24 hours 06/21/20 0816 06/22/20 1139   06/21/20 1200  ceFEPIme (MAXIPIME) 2 g in sodium chloride 0.9 % 100 mL IVPB  Status:  Discontinued        2 g 200 mL/hr over 30 Minutes Intravenous Every 12 hours 06/21/20 0816 06/22/20 1139   06/21/20 1000  remdesivir 100 mg in sodium chloride 0.9 % 100 mL IVPB  Status:  Discontinued       "Followed by" Linked Group Details   100 mg 200 mL/hr over 30 Minutes Intravenous Daily 06/20/20 2359 06/21/20 0002   06/21/20 1000  remdesivir 100 mg in sodium chloride 0.9 % 100 mL IVPB       "Followed by" Linked Group Details   100 mg 200 mL/hr over 30 Minutes Intravenous Daily 06/21/20 0002 06/25/20 0959   06/21/20 1000  cefTRIAXone (ROCEPHIN) 2 g in sodium chloride 0.9 % 100 mL IVPB  Status:  Discontinued        2 g 200 mL/hr over 30 Minutes Intravenous Every 24 hours 06/21/20 0639 06/21/20 0723   06/21/20 1000  azithromycin (ZITHROMAX) 500 mg in sodium chloride 0.9 % 250 mL IVPB  Status:  Discontinued        500 mg 250 mL/hr over 60 Minutes Intravenous Every 24 hours 06/21/20 0639 06/21/20 0723   06/21/20 0945  vancomycin (VANCOREADY) IVPB 500 mg/100 mL        500 mg 100 mL/hr over 60 Minutes Intravenous  Once 06/21/20 0932 06/21/20 1126   06/21/20 0900  vancomycin (VANCOREADY) IVPB 1500 mg/300 mL  Status:  Discontinued        1,500 mg 150 mL/hr over 120 Minutes Intravenous  Once 06/21/20 0816 06/21/20 0932   06/21/20 0645  vancomycin (VANCOCIN) IVPB 1000 mg/200 mL  premix        1,000 mg 200 mL/hr over 60 Minutes Intravenous  Once 06/21/20 6962 06/21/20 0834   06/21/20 0645  piperacillin-tazobactam (ZOSYN) IVPB 3.375 g        3.375 g 100 mL/hr over 30 Minutes Intravenous  Once 06/21/20 9528 06/21/20 0716   06/21/20 0645  cefTRIAXone (ROCEPHIN) 2 g in sodium chloride 0.9 % 100 mL IVPB  Status:  Discontinued        2 g 200 mL/hr over 30 Minutes Intravenous Every 24 hours 06/21/20 0634 06/21/20 0634   06/21/20 0645  azithromycin (ZITHROMAX) 500 mg in sodium chloride 0.9 % 250 mL IVPB  Status:  Discontinued        500 mg 250 mL/hr over 60 Minutes Intravenous Every 24 hours 06/21/20 0634 06/21/20 0634   06/21/20 0130  remdesivir 200 mg in sodium chloride 0.9% 250 mL IVPB  Status:  Discontinued       "  Followed by" Linked Group Details   200 mg 580 mL/hr over 30 Minutes Intravenous Once 06/21/20 0126 06/21/20 0630   06/21/20 0015  remdesivir 200 mg in sodium chloride 0.9% 250 mL IVPB       "Followed by" Linked Group Details   200 mg 580 mL/hr over 30 Minutes Intravenous Once 06/21/20 0002 06/21/20 0251   06/21/20 0000  remdesivir 200 mg in sodium chloride 0.9% 250 mL IVPB  Status:  Discontinued       "Followed by" Linked Group Details   200 mg 580 mL/hr over 30 Minutes Intravenous Once 06/20/20 2359 06/21/20 0002        Objective   Blood pressure (!) 161/84, pulse (!) 134, temperature 99.6 F (37.6 C), temperature source Oral, resp. rate (!) 39, height 4\' 11"  (1.499 m), weight 65.5 kg, SpO2 94 %.        Intake/Output Summary (Last 24 hours) at 06/23/2020 1138 Last data filed at 06/23/2020 0600 Gross per 24 hour  Intake 250.72 ml  Output 2450 ml  Net -2199.28 ml   Filed Weights   06/20/20 1432 06/22/20 0622  Weight: 67.6 kg 65.5 kg    PHYSICAL EXAMINATION:  GENERAL:critically ill appearing, +resp distress HEAD: Normocephalic, atraumatic.  EYES: Pupils equal, round, reactive to light.  No scleral icterus.  MOUTH: Moist mucosal  membrane. NECK: Supple. No thyromegaly. No nodules. No JVD.  PULMONARY: +rhonchi, +wheezing CARDIOVASCULAR: S1 and S2. Regular rate and rhythm. No murmurs, rubs, or gallops.  GASTROINTESTINAL: Soft, nontender, -distended. Positive bowel sounds.  MUSCULOSKELETAL: No swelling, clubbing, or edema.  NEUROLOGIC: awake, agitated and confused SKIN:intact,warm,dry  Labs   CBC: Recent Labs  Lab 06/20/20 1437 06/21/20 0323 06/21/20 0755 06/22/20 0359 06/23/20 0222  WBC 4.3 5.1 4.8 9.0 7.2  NEUTROABS 3.7  --  3.6 7.3 5.8  HGB 13.1 12.1 10.6* 12.8 12.6  HCT 38.6 35.9* 30.7* 37.2 37.7  MCV 89.6 91.6 90.0 91.0 91.5  PLT 173 144* 132* 151 [id number]    Basic Metabolic Panel: Recent Labs  Lab 06/20/20 1437 06/21/20 0323 06/21/20 0755 06/21/20 1434 06/22/20 0359 06/23/20 0222  NA 131*  --  137 140 145 152*  K 3.4*  --  3.5 3.5 3.5 3.4*  CL 92*  --  102 105 109 113*  CO2 19*  --  20* 23 26 27   GLUCOSE 290*  --  268* 192* 260* 211*  BUN 22*  --  16 16 21* 30*  CREATININE 1.12* 1.15* 0.99 0.94 0.93 0.95  CALCIUM 8.9  --  8.0* 8.1* 8.3* 8.7*  MG  --   --   --   --  2.2 2.6*   GFR: Estimated Creatinine Clearance: 55.7 mL/min (by C-G formula based on SCr of 0.95 mg/dL). Recent Labs  Lab 06/20/20 1437 06/21/20 0046 06/21/20 0323 06/21/20 0755 06/21/20 1114 06/22/20 0359 06/22/20 0833 06/23/20 0222  PROCALCITON  --  1.43  --   --   --   --   --   --   WBC 4.3  --  5.1 4.8  --  9.0  --  7.2  LATICACIDVEN 1.1  --   --  2.2* 2.6*  --  2.3*  --     Liver Function Tests: Recent Labs  Lab 06/20/20 1437 06/22/20 0359 06/23/20 0222  AST 75* 86* 57*  ALT 44 41 35  ALKPHOS 59 60 65  BILITOT 1.4* 0.9 1.1  PROT 7.7 6.6 6.8  ALBUMIN 4.3 3.3* 3.3*  No results for input(s): LIPASE, AMYLASE in the last 168 hours. Recent Labs  Lab 06/23/20 0832  AMMONIA 24    ABG    Component Value Date/Time   PHART 7.54 (H) 06/21/2020 1411   PCO2ART 30 (L) 06/21/2020 1411   PO2ART 71 (L)  06/21/2020 1411   HCO3 24.7 06/21/2020 1411   TCO2 32 05/05/2020 0200   ACIDBASEDEF 6.4 (H) 06/21/2020 0120   O2SAT 94.4 06/21/2020 1411     Coagulation Profile: No results for input(s): INR, PROTIME in the last 168 hours.  Cardiac Enzymes: No results for input(s): CKTOTAL, CKMB, CKMBINDEX, TROPONINI in the last 168 hours.  HbA1C: Hgb A1c MFr Bld  Date/Time Value Ref Range Status  06/21/2020 03:23 AM 8.2 (H) 4.8 - 5.6 % Final    Comment:    (NOTE) Pre diabetes:          5.7%-6.4%  Diabetes:              >6.4%  Glycemic control for   <7.0% adults with diabetes     CBG: Recent Labs  Lab 06/22/20 1925 06/22/20 2343 06/23/20 0012 06/23/20 0330 06/23/20 0808  GLUCAP 285* 171* 169* 215* 281*    Review of Systems:   REVIEW OF SYSTEMS  PATIENT IS UNABLE TO PROVIDE COMPLETE REVIEW OF SYSTEM S DUE TO SEVERE CRITICAL ILLNESS AND ENCEPHALOPATHY   Past Medical History:  She,  has a past medical history of Anxiety, Asthma, Depression, Diabetes mellitus (HCC), Esophageal stricture, Gastroparesis, GERD (gastroesophageal reflux disease), Glaucoma, Graves disease (02/2010), Helicobacter pylori infection, Hyperlipidemia, Hypertension, Hypothyroidism, Insulin pump in place, Nephrolithiasis (2010), Neuropathy, Osteoporosis, Sleep apnea, Sleep apnea with use of continuous positive airway pressure (CPAP), and Vitamin D deficiency.   Surgical History:   Past Surgical History:  Procedure Laterality Date  . ABDOMINAL AORTOGRAM W/LOWER EXTREMITY Bilateral 01/01/2019   Procedure: ABDOMINAL AORTOGRAM W/LOWER EXTREMITY;  Surgeon: Runell Gess, MD;  Location: [facility] INVASIVE CV LAB;  Service: Cardiovascular;  Laterality: Bilateral;  . BREAST EXCISIONAL BIOPSY    . BREAST LUMPECTOMY Left    LEFT-BENIGN  . NASAL SEPTOPLASTY W/ TURBINOPLASTY Bilateral 09/07/2013   Procedure: SEPTOPLASTY, BILATERAL TURBINATE RESECTION ;  Surgeon: Darletta Moll, MD;  Location: [facility];  Service:  ENT;  Laterality: Bilateral;  . PERIPHERAL VASCULAR INTERVENTION Bilateral 01/01/2019   Procedure: PERIPHERAL VASCULAR INTERVENTION;  Surgeon: Runell Gess, MD;  Location: [facility] INVASIVE CV LAB;  Service: Cardiovascular;  Laterality: Bilateral;  . UPPER GASTROINTESTINAL ENDOSCOPY    . VAGINAL HYSTERECTOMY  2006     Social History:   reports that she has been smoking cigarettes. She has a 12.50 pack-year smoking history. She has never used smokeless tobacco. She reports that she does not drink alcohol and does not use drugs.   Family History:  Her family history includes Alcoholism in her father; Diabetes in her father; Heart attack in her father; Irritable bowel syndrome in her sister; Liver cancer in her mother; Lung cancer in her mother. There is no history of Colon cancer, Esophageal cancer, Rectal cancer, or Stomach cancer.   Allergies Allergies  Allergen Reactions  . Mirtazapine Rash    And hives  . Clindamycin Hives  . Other Hives    Tylox   . Sulfonamide Derivatives Hives  . Lipitor [Atorvastatin] Other (See Comments)    MUSCLE ACHES  . Restasis [Cyclosporine]     Pt stated, "My eyes turned red on the inside and outside of eye; burning sensation"  .  Topamax [Topiramate] Itching     Home Medications  Prior to Admission medications   Medication Sig Start Date End Date Taking? Authorizing Provider  albuterol (PROVENTIL) (2.5 MG/3ML) 0.083% nebulizer solution Take 2.5 mg by nebulization every 4 (four) hours as needed for wheezing or shortness of breath.  03/18/15  Yes [provider]  ALPRAZolam Duanne Moron) 0.5 MG tablet Take 0.5 mg by mouth 3 (three) times daily as needed for anxiety.  10/08/12  Yes [provider]  amLODipine (NORVASC) 5 MG tablet Take 1 tablet (5 mg total) by mouth daily. Patient taking differently: Take 5 mg by mouth in the morning and at bedtime. 01/21/19  Yes Lorretta Harp, MD  busPIRone (BUSPAR) 15 MG tablet Take 15 mg by mouth 2 (two)  times daily. 05/25/20  Yes [provider]  clopidogrel (PLAVIX) 75 MG tablet TAKE 1 TABLET (75 MG TOTAL) BY MOUTH DAILY WITH BREAKFAST. 12/24/19  Yes Lorretta Harp, MD  DULoxetine (CYMBALTA) 30 MG capsule Take 30 mg by mouth every morning. Takes 90 mg total 01/22/18  Yes [provider]  DULoxetine (CYMBALTA) 60 MG capsule Take 60 mg by mouth every morning. Takes 90 mg total   Yes [provider]  estradiol (ESTRACE) 0.5 MG tablet Take 0.5 mg by mouth daily. 04/07/20  Yes [provider]  famotidine (PEPCID) 40 MG tablet Take 1 tablet (40 mg total) by mouth at bedtime. 02/24/20  Yes Irene Shipper, MD  folic acid (FOLVITE) 1 MG tablet Take 1 tablet by mouth daily. 06/23/19  Yes [provider]  hydrochlorothiazide (MICROZIDE) 12.5 MG capsule TAKE 1 CAPSULE EVERY DAY Patient taking differently: Take 12.5 mg by mouth daily. 02/25/20  Yes Lorretta Harp, MD  ibandronate (BONIVA) 150 MG tablet Take 150 mg by mouth every 30 (thirty) days. 03/23/20  Yes [provider]  Insulin Human (INSULIN PUMP) SOLN Inject into the skin.   Yes [provider]  lansoprazole (PREVACID) 30 MG capsule Take 1 capsule (30 mg total) by mouth in the morning and at bedtime. 12/04/19  Yes Irene Shipper, MD  latanoprost (XALATAN) 0.005 % ophthalmic solution Place 1 drop into both eyes at bedtime. 12/26/17  Yes [provider]  levocetirizine (XYZAL) 5 MG tablet Take 5 mg by mouth every morning.    Yes [provider]  levothyroxine (SYNTHROID, LEVOTHROID) 112 MCG tablet Take 112 mcg by mouth daily before breakfast.  01/07/18  Yes [provider]  losartan (COZAAR) 100 MG tablet Take 100 mg by mouth at bedtime.    Yes [provider]  methotrexate (RHEUMATREX) 2.5 MG tablet 5 tablets by moth twice a day once a week 06/23/19  Yes [provider]  metoprolol (TOPROL-XL) 200 MG 24 hr tablet Take 200 mg by mouth daily. One tablet  by mouth once daily  09/26/12  Yes [provider]  montelukast (SINGULAIR) 10 MG tablet Take 10 mg by mouth at bedtime.   Yes [provider]  NOVOLOG 100 UNIT/ML injection Inject into the skin See admin instructions. Pt has Insulin pump 08/06/18  Yes [provider]  pregabalin (LYRICA) 75 MG capsule Take 150 mg by mouth at bedtime.  07/24/18  Yes [provider]  promethazine (PHENERGAN) 25 MG tablet Take 25 mg by mouth every 4 (four) hours as needed for nausea or vomiting. Every 4-6 hours as needed 08/30/14  Yes [provider]  rosuvastatin (CRESTOR) 20 MG tablet Take 20 mg by mouth at  bedtime.   Yes [provider]  Vitamin D, Ergocalciferol, (DRISDOL) 50000 UNITS CAPS Take 50,000 Units by mouth every 7 (seven) days.   Yes [provider]  ACCU-CHEK COMPACT PLUS test strip 1 each by Other route as needed.  10/20/12   [provider]  ACCU-CHEK SOFTCLIX LANCETS lancets  05/19/18   [provider]  AMBULATORY NON [employer] INSULIN PUMP USES AS DIRECTED    [provider]      Assessment & Plan:  55 yo obese white female with acute and severe resp failure with acute and severe ARDS with b/l pneumonia due to COVID pneumonia with acute COPD exacerbation  Severe COVID-19 infection, ARDS and pneumonia/pneumonitis Continue IV steroids  IV remdisivir Continue proning as tolerated due to severe hypoxia   Maintain airborne and contact precautions  As needed bronchodilators (MDI) Vitamin C and zinc Antitussives High risk for intubation   ENCEPHALOPATHY   PROBABLE ACUTE DIASTOLIC CARDIAC FAILURE-  -oxygen as needed -Lasix as tolerated  ELECTROLYTES -follow labs as needed -replace as needed -pharmacy consultation and following   DVT/GI PRX ordered and assessed TRANSFUSIONS AS NEEDED MONITOR FSBS I Assessed the need for Labs I Assessed the need for Foley I Assessed the need for  Central Venous Line Family Discussion when available I Assessed the need for Mobilization I made an Assessment of medications to be adjusted accordingly Safety Risk assessment completed  CASE DISCUSSED IN MULTIDISCIPLINARY ROUNDS WITH ICU TEAM    Critical Care Time devoted to patient care services described in this note is 65  minutes.   Overall, patient is critically ill, prognosis is guarded.  Patient with Multiorgan failure and at high risk for cardiac arrest and death.    Corrin Parker, M.D.  [facility]  Medical Director [place] Director [facility] Cardio-Pulmonary Department

## 2020-06-24 ENCOUNTER — Inpatient Hospital Stay (HOSPITAL_COMMUNITY)
Admit: 2020-06-24 | Discharge: 2020-06-24 | Disposition: A | Discharge status: Home / Self Care | Payer: Medicare HMO | Attending: Internal Medicine | Admitting: Internal Medicine

## 2020-06-24 ENCOUNTER — Inpatient Hospital Stay: Payer: Medicare HMO

## 2020-06-24 DIAGNOSIS — R0603 Acute respiratory distress: Secondary | ICD-10-CM | POA: Diagnosis not present

## 2020-06-24 LAB — CBC WITH DIFFERENTIAL/PLATELET
Abs Immature Granulocytes: 0.13 10*3/uL — ABNORMAL HIGH (ref 0.00–0.07)
Basophils Absolute: 0 10*3/uL (ref 0.0–0.1)
Basophils Relative: 0 %
Eosinophils Absolute: 0 10*3/uL (ref 0.0–0.5)
Eosinophils Relative: 0 %
HCT: 33.7 % — ABNORMAL LOW (ref 36.0–46.0)
Hemoglobin: 11.2 g/dL — ABNORMAL LOW (ref 12.0–15.0)
Immature Granulocytes: 1 %
Lymphocytes Relative: 9 %
Lymphs Abs: 1.6 10*3/uL (ref 0.7–4.0)
MCH: 30.9 pg (ref 26.0–34.0)
MCHC: 33.2 g/dL (ref 30.0–36.0)
MCV: 93.1 fL (ref 80.0–100.0)
Monocytes Absolute: 1 10*3/uL (ref 0.1–1.0)
Monocytes Relative: 6 %
Neutro Abs: 15.1 10*3/uL — ABNORMAL HIGH (ref 1.7–7.7)
Neutrophils Relative %: 84 %
Platelets: 321 10*3/uL (ref 150–400)
RBC: 3.62 MIL/uL — ABNORMAL LOW (ref 3.87–5.11)
RDW: 15.8 % — ABNORMAL HIGH (ref 11.5–15.5)
Smear Review: NORMAL
WBC: 17.9 10*3/uL — ABNORMAL HIGH (ref 4.0–10.5)
nRBC: 0.2 % (ref 0.0–0.2)

## 2020-06-24 LAB — ECHOCARDIOGRAM COMPLETE
AR max vel: 2.71 cm2
AV Area VTI: 2.44 cm2
AV Area mean vel: 2.58 cm2
AV Mean grad: 6 mmHg
AV Peak grad: 12.4 mmHg
Ao pk vel: 1.76 m/s
Area-P 1/2: 3 cm2
Calc EF: 62.1 %
Height: 59 in
MV VTI: 3.05 cm2
S' Lateral: 2.6 cm
Single Plane A2C EF: 62.5 %
Single Plane A4C EF: 61.5 %
Weight: 2310.42 oz

## 2020-06-24 LAB — GLUCOSE, CAPILLARY
Glucose-Capillary: 101 mg/dL — ABNORMAL HIGH (ref 70–99)
Glucose-Capillary: 87 mg/dL (ref 70–99)
Glucose-Capillary: 107 mg/dL — ABNORMAL HIGH (ref 70–99)
Glucose-Capillary: 204 mg/dL — ABNORMAL HIGH (ref 70–99)
Glucose-Capillary: 297 mg/dL — ABNORMAL HIGH (ref 70–99)
Glucose-Capillary: 259 mg/dL — ABNORMAL HIGH (ref 70–99)
Glucose-Capillary: 220 mg/dL — ABNORMAL HIGH (ref 70–99)

## 2020-06-24 LAB — COMPREHENSIVE METABOLIC PANEL
ALT: 26 U/L (ref 0–44)
AST: 42 U/L — ABNORMAL HIGH (ref 15–41)
Albumin: 2.9 g/dL — ABNORMAL LOW (ref 3.5–5.0)
Alkaline Phosphatase: 76 U/L (ref 38–126)
Anion gap: 11 (ref 5–15)
BUN: 46 mg/dL — ABNORMAL HIGH (ref 6–20)
CO2: 30 mmol/L (ref 22–32)
Calcium: 8.4 mg/dL — ABNORMAL LOW (ref 8.9–10.3)
Chloride: 118 mmol/L — ABNORMAL HIGH (ref 98–111)
Creatinine, Ser: 1.25 mg/dL — ABNORMAL HIGH (ref 0.44–1.00)
GFR, Estimated: 51 mL/min — ABNORMAL LOW (ref >60)
Glucose, Bld: 78 mg/dL (ref 70–99)
Potassium: 3 mmol/L — ABNORMAL LOW (ref 3.5–5.1)
Sodium: 159 mmol/L — ABNORMAL HIGH (ref 135–145)
Total Bilirubin: 0.7 mg/dL (ref 0.3–1.2)
Total Protein: 5.9 g/dL — ABNORMAL LOW (ref 6.5–8.1)

## 2020-06-24 LAB — C-REACTIVE PROTEIN: CRP: 4.5 mg/dL — ABNORMAL HIGH (ref <1.0)

## 2020-06-24 LAB — BASIC METABOLIC PANEL
Anion gap: 13 (ref 5–15)
BUN: 41 mg/dL — ABNORMAL HIGH (ref 6–20)
CO2: 28 mmol/L (ref 22–32)
Calcium: 7.9 mg/dL — ABNORMAL LOW (ref 8.9–10.3)
Chloride: 112 mmol/L — ABNORMAL HIGH (ref 98–111)
Creatinine, Ser: 1.12 mg/dL — ABNORMAL HIGH (ref 0.44–1.00)
GFR, Estimated: 58 mL/min — ABNORMAL LOW (ref >60)
Glucose, Bld: 280 mg/dL — ABNORMAL HIGH (ref 70–99)
Potassium: 3.8 mmol/L (ref 3.5–5.1)
Sodium: 153 mmol/L — ABNORMAL HIGH (ref 135–145)

## 2020-06-24 LAB — MAGNESIUM: Magnesium: 2.6 mg/dL — ABNORMAL HIGH (ref 1.7–2.4)

## 2020-06-24 LAB — PROCALCITONIN: Procalcitonin: 0.7 ng/mL

## 2020-06-24 LAB — FIBRIN DERIVATIVES D-DIMER (ARMC ONLY): Fibrin derivatives D-dimer (ARMC): 1121.33 ng/mL (FEU) — ABNORMAL HIGH (ref 0.00–499.00)

## 2020-06-24 LAB — PHOSPHORUS: Phosphorus: 1.7 mg/dL — ABNORMAL LOW (ref 2.5–4.6)

## 2020-06-24 MED ORDER — FREE WATER
200.0000 mL | Freq: Four times a day (QID) | Status: DC
  Administered 2020-06-24 – 2020-06-26 (×8): 200 mL

## 2020-06-24 MED ORDER — ZINC SULFATE 220 (50 ZN) MG PO CAPS
220.0000 mg | ORAL_CAPSULE | Freq: Every day | ORAL | Status: DC
  Administered 2020-06-25 – 2020-06-28 (×4): 220 mg
  Filled 2020-06-24 (×4): qty 1

## 2020-06-24 MED ORDER — CHLORHEXIDINE GLUCONATE 0.12% ORAL RINSE (MEDLINE KIT)
15.0000 mL | Freq: Two times a day (BID) | OROMUCOSAL | Status: DC
  Administered 2020-06-24 – 2020-06-30 (×14): 15 mL via OROMUCOSAL

## 2020-06-24 MED ORDER — ASCORBIC ACID 500 MG PO TABS
500.0000 mg | ORAL_TABLET | Freq: Every day | ORAL | Status: DC
  Administered 2020-06-25 – 2020-06-28 (×4): 500 mg
  Filled 2020-06-24 (×4): qty 1

## 2020-06-24 MED ORDER — PANTOPRAZOLE SODIUM 40 MG IV SOLR
40.0000 mg | INTRAVENOUS | Status: DC
  Administered 2020-06-24 – 2020-06-30 (×7): 40 mg via INTRAVENOUS
  Filled 2020-06-24 (×7): qty 40

## 2020-06-24 MED ORDER — HEPARIN SODIUM (PORCINE) 5000 UNIT/ML IJ SOLN: 5000.0000 [IU] | Freq: Three times a day (TID) | INTRAMUSCULAR | Status: DC

## 2020-06-24 MED ORDER — ADULT MULTIVITAMIN W/MINERALS CH
1.0000 | ORAL_TABLET | Freq: Every day | ORAL | Status: DC
  Administered 2020-06-25 – 2020-06-30 (×6): 1
  Filled 2020-06-24 (×6): qty 1

## 2020-06-24 MED ORDER — VITAL AF 1.2 CAL PO LIQD
1000.0000 mL | ORAL | Status: DC
  Administered 2020-06-24 – 2020-06-30 (×4): 1000 mL

## 2020-06-24 MED ORDER — PROSOURCE TF PO LIQD
45.0000 mL | Freq: Three times a day (TID) | ORAL | Status: DC
  Administered 2020-06-24 – 2020-06-30 (×18): 45 mL
  Filled 2020-06-24 (×23): qty 45

## 2020-06-24 MED ORDER — METOPROLOL TARTRATE 25 MG PO TABS
12.5000 mg | ORAL_TABLET | Freq: Two times a day (BID) | ORAL | Status: DC
  Administered 2020-06-24 – 2020-06-25 (×2): 12.5 mg
  Filled 2020-06-24 (×3): qty 1

## 2020-06-24 MED ORDER — ENOXAPARIN SODIUM 40 MG/0.4ML ~~LOC~~ SOLN
40.0000 mg | SUBCUTANEOUS | Status: DC
  Administered 2020-06-25 – 2020-07-06 (×12): 40 mg via SUBCUTANEOUS
  Filled 2020-06-24 (×12): qty 0.4

## 2020-06-24 MED ORDER — POTASSIUM PHOSPHATES 15 MMOLE/5ML IV SOLN
30.0000 mmol | Freq: Once | INTRAVENOUS | Status: AC
  Administered 2020-06-24: 30 mmol via INTRAVENOUS
  Filled 2020-06-24: qty 10

## 2020-06-24 MED ORDER — ACETAMINOPHEN 325 MG PO TABS: 650.0000 mg | ORAL_TABLET | Freq: Four times a day (QID) | ORAL | Status: DC | PRN

## 2020-06-24 MED ORDER — POTASSIUM CHLORIDE 2 MEQ/ML IV SOLN
INTRAVENOUS | Status: AC
  Filled 2020-06-24 (×3): qty 1000

## 2020-06-24 MED ORDER — ORAL CARE MOUTH RINSE
15.0000 mL | OROMUCOSAL | Status: DC
  Administered 2020-06-24 – 2020-06-30 (×61): 15 mL via OROMUCOSAL

## 2020-06-24 MED ORDER — VITAL HIGH PROTEIN PO LIQD: 1000.0000 mL | ORAL | Status: DC

## 2020-06-24 NOTE — Progress Notes (Signed)
NAME:  Ashley [name], MRN:  [id number], DOB:  April 07, 1966, LOS: 3 ADMISSION DATE:  06/20/2020, CONSULTATION DATE:  06/23/2020 REFERRING MD:  Dr. Priscella Mann, CHIEF COMPLAINT:  Altered Mental Status, COVID-19 Pneumonia  Brief History:  55 y.o. Female admitted with Acute Metabolic Encephalopathy in the setting of COVID-19 Pneumonia & DKA/HHS.  History of Present Illness:  55 year old female extensive past medical history as per medical record who presents for lethargy, subjective fevers at home, decreased level of consciousness. Found to have multifocal pneumonia in the setting of COVID-19 infection. Also with elevated procalcitonin concerning for bacterial pulm infection. Also presented with DKA/HHS. She has been on intravenous insulin infusion via Endo tool protocol. Anion gap closed. Glycemic control improved. Will transition off of IV insulin.   06/24/20- patient on MV critically ill.  Labwork this am with hypernatremia, aki, transaminitis, hypokalemia. CXR with bilateral air space opacification worse at RLL.   Weaning levophed.  I spoke with husband Meika Macaraeg and reviewed care plan with him, he will be coming in this afternoon to see patient.   Past Medical History:   Active Ambulatory Problems    Diagnosis Date Noted  . DIAB W/O MENTION COMP TYPE I [JUV TYPE] UNCNTRL 02/28/2009  . GERD 02/28/2009  . Gastroparesis 03/22/2009  . DYSPHAGIA UNSPECIFIED 02/28/2009  . DYSPHAGIA 03/22/2009  . ABDOMINAL PAIN-EPIGASTRIC 02/28/2009  . Nausea with vomiting 10/22/2012  . Diarrhea 10/22/2012  . Dyspnea and respiratory abnormality 04/26/2015  . Chest tightness 04/26/2015  . Hoarseness of voice 04/26/2015  . OSA and COPD overlap syndrome ([place]) 04/02/2018  . Excessive daytime sleepiness 04/02/2018  . Delayed sleep phase syndrome 04/02/2018  . Moderate persistent asthma 04/02/2018  . Loud snoring 05/19/2018  . OSA on CPAP 10/08/2018  . Essential hypertension 12/25/2018  .  Hyperlipidemia 12/25/2018  . Peripheral arterial disease ([place]) 12/25/2018  . Claudication in peripheral vascular disease ([place]) 01/01/2019  . Tobacco abuse 02/24/2020  . Hypertensive disorder 12/09/2018   Resolved Ambulatory Problems    Diagnosis Date Noted  . No Resolved Ambulatory Problems   Past Medical History:  Diagnosis Date  . Anxiety   . Asthma   . Depression   . Diabetes mellitus ([place])   . Esophageal stricture   . GERD (gastroesophageal reflux disease)   . Glaucoma   . Graves disease 02/2010  . Helicobacter pylori infection   . Hypertension   . Hypothyroidism   . Insulin pump in place   . Nephrolithiasis 2010  . Neuropathy   . Osteoporosis   . Sleep apnea   . Sleep apnea with use of continuous positive airway pressure (CPAP)   . Vitamin D deficiency      Significant Hospital Events:  1/10: Presented to ED; Hospitalist admit to [facility] 1/13: PCCM consult  Consults:  Hospitalist (primary service) Neurology PCCM Diabetes Coordinator  Procedures:  N/A  Significant Diagnostic Tests:  1/11: CT Head w/o Contrast>>Mild chronic ischemic white matter disease. No acute intracranial abnormality seen. 1/11: CTA Chest>>1. Extensive motion artifact limits assessment without signs of central or lobar level pulmonary embolism. 2. Diffuse areas of consolidative change, nodularity and ground-glass. Findings show midlung and basilar predominance. Findings of multifocal pneumonia in the setting of COVID-19 infection. 3. Mild mediastinal and hilar nodal enlargement likely reactive. 4. Aortic atherosclerosis. 1/11: MR Brain w/o contrast>>No acute abnormality. Mild white matter changes similar to 2010. Mild atrophy with progression since 2010.  Micro Data:  1/11: SARS-CoV-2 PCR>> Positive 1/11: Influenza A&B PCR>>negative  Antimicrobials:  Anti-infectives (From admission, onward)   Start     Dose/Rate Route Frequency Ordered Stop   06/23/20 1800   Ampicillin-Sulbactam (UNASYN) 3 g in sodium chloride 0.9 % 100 mL IVPB        3 g 200 mL/hr over 30 Minutes Intravenous Every 6 hours 06/23/20 1519     06/22/20 2200  ceFEPIme (MAXIPIME) 2 g in sodium chloride 0.9 % 100 mL IVPB  Status:  Discontinued        2 g 200 mL/hr over 30 Minutes Intravenous Every 8 hours 06/22/20 1138 06/23/20 1519   06/22/20 2200  vancomycin (VANCOREADY) IVPB 750 mg/150 mL  Status:  Discontinued        750 mg 150 mL/hr over 60 Minutes Intravenous Every 12 hours 06/22/20 1138 06/23/20 1519   06/22/20 1000  remdesivir 100 mg in sodium chloride 0.9 % 100 mL IVPB  Status:  Discontinued       "Followed by" Linked Group Details   100 mg 200 mL/hr over 30 Minutes Intravenous Daily 06/21/20 0126 06/21/20 0630   06/22/20 1000  vancomycin (VANCOCIN) IVPB 1000 mg/200 mL premix  Status:  Discontinued        1,000 mg 200 mL/hr over 60 Minutes Intravenous Every 24 hours 06/21/20 0816 06/22/20 1139   06/21/20 1200  ceFEPIme (MAXIPIME) 2 g in sodium chloride 0.9 % 100 mL IVPB  Status:  Discontinued        2 g 200 mL/hr over 30 Minutes Intravenous Every 12 hours 06/21/20 0816 06/22/20 1139   06/21/20 1000  remdesivir 100 mg in sodium chloride 0.9 % 100 mL IVPB  Status:  Discontinued       "Followed by" Linked Group Details   100 mg 200 mL/hr over 30 Minutes Intravenous Daily 06/20/20 2359 06/21/20 0002   06/21/20 1000  remdesivir 100 mg in sodium chloride 0.9 % 100 mL IVPB       "Followed by" Linked Group Details   100 mg 200 mL/hr over 30 Minutes Intravenous Daily 06/21/20 0002 06/25/20 0959   06/21/20 1000  cefTRIAXone (ROCEPHIN) 2 g in sodium chloride 0.9 % 100 mL IVPB  Status:  Discontinued        2 g 200 mL/hr over 30 Minutes Intravenous Every 24 hours 06/21/20 0639 06/21/20 0723   06/21/20 1000  azithromycin (ZITHROMAX) 500 mg in sodium chloride 0.9 % 250 mL IVPB  Status:  Discontinued        500 mg 250 mL/hr over 60 Minutes Intravenous Every 24 hours 06/21/20 0639  06/21/20 0723   06/21/20 0945  vancomycin (VANCOREADY) IVPB 500 mg/100 mL        500 mg 100 mL/hr over 60 Minutes Intravenous  Once 06/21/20 0932 06/21/20 1126   06/21/20 0900  vancomycin (VANCOREADY) IVPB 1500 mg/300 mL  Status:  Discontinued        1,500 mg 150 mL/hr over 120 Minutes Intravenous  Once 06/21/20 0816 06/21/20 0932   06/21/20 0645  vancomycin (VANCOCIN) IVPB 1000 mg/200 mL premix        1,000 mg 200 mL/hr over 60 Minutes Intravenous  Once 06/21/20 0632 06/21/20 0834   06/21/20 0645  piperacillin-tazobactam (ZOSYN) IVPB 3.375 g        3.375 g 100 mL/hr over 30 Minutes Intravenous  Once 06/21/20 0632 06/21/20 0716   06/21/20 0645  cefTRIAXone (ROCEPHIN) 2 g in sodium chloride 0.9 % 100 mL IVPB  Status:  Discontinued        2 g  200 mL/hr over 30 Minutes Intravenous Every 24 hours 06/21/20 0634 06/21/20 0634   06/21/20 0645  azithromycin (ZITHROMAX) 500 mg in sodium chloride 0.9 % 250 mL IVPB  Status:  Discontinued        500 mg 250 mL/hr over 60 Minutes Intravenous Every 24 hours 06/21/20 0634 06/21/20 0634   06/21/20 0130  remdesivir 200 mg in sodium chloride 0.9% 250 mL IVPB  Status:  Discontinued       "Followed by" Linked Group Details   200 mg 580 mL/hr over 30 Minutes Intravenous Once 06/21/20 0126 06/21/20 0630   06/21/20 0015  remdesivir 200 mg in sodium chloride 0.9% 250 mL IVPB       "Followed by" Linked Group Details   200 mg 580 mL/hr over 30 Minutes Intravenous Once 06/21/20 0002 06/21/20 0251   06/21/20 0000  remdesivir 200 mg in sodium chloride 0.9% 250 mL IVPB  Status:  Discontinued       "Followed by" Linked Group Details   200 mg 580 mL/hr over 30 Minutes Intravenous Once 06/20/20 2359 06/21/20 0002        Objective   Blood pressure (!) 111/56, pulse 75, temperature 99.32 F (37.4 C), resp. rate 16, height 4\' 11"  (1.499 m), weight 65.5 kg, SpO2 99 %.    Vent Mode: PRVC FiO2 (%):  [40 %-100 %] 40 % Set Rate:  [16 bmp] 16 bmp Vt Set:  [480 mL]  480 mL PEEP:  [8 cmH20] 8 cmH20   Intake/Output Summary (Last 24 hours) at 06/24/2020 0937 Last data filed at 06/24/2020 1027 Gross per 24 hour  Intake 1434.03 ml  Output 1525 ml  Net -90.97 ml   Filed Weights   06/20/20 1432 06/22/20 0622  Weight: 67.6 kg 65.5 kg    PHYSICAL EXAMINATION:  GENERAL:critically ill appearing, +resp distress HEAD: Normocephalic, atraumatic.  EYES: Pupils equal, round, reactive to light.  No scleral icterus.  MOUTH: Moist mucosal membrane. NECK: Supple. No thyromegaly. No nodules. No JVD.  PULMONARY: +rhonchi, CARDIOVASCULAR: S1 and S2. Regular rate and rhythm. No murmurs, rubs, or gallops.  GASTROINTESTINAL: Soft, nontender, -distended. Positive bowel sounds.  MUSCULOSKELETAL: No swelling, clubbing, or edema.  NEUROLOGIC: awake, agitated and confused SKIN:intact,warm,dry  Labs   CBC: Recent Labs  Lab 06/20/20 1437 06/21/20 0323 06/21/20 0755 06/22/20 0359 06/23/20 0222 06/24/20 0334  WBC 4.3 5.1 4.8 9.0 7.2 17.9*  NEUTROABS 3.7  --  3.6 7.3 5.8 15.1*  HGB 13.1 12.1 10.6* 12.8 12.6 11.2*  HCT 38.6 35.9* 30.7* 37.2 37.7 33.7*  MCV 89.6 91.6 90.0 91.0 91.5 93.1  PLT 173 144* 132* 151 196 253    Basic Metabolic Panel: Recent Labs  Lab 06/21/20 0755 06/21/20 1434 06/22/20 0359 06/23/20 0222 06/24/20 0334  NA 137 140 145 152* 159*  K 3.5 3.5 3.5 3.4* 3.0*  CL 102 105 109 113* 118*  CO2 20* 23 26 27 30   GLUCOSE 268* 192* 260* 211* 78  BUN 16 16 21* 30* 46*  CREATININE 0.99 0.94 0.93 0.95 1.25*  CALCIUM 8.0* 8.1* 8.3* 8.7* 8.4*  MG  --   --  2.2 2.6* 2.6*  PHOS  --   --   --   --  1.7*   GFR: Estimated Creatinine Clearance: 42.3 mL/min (A) (by C-G formula based on SCr of 1.25 mg/dL (H)). Recent Labs  Lab 06/20/20 1437 06/21/20 0046 06/21/20 0323 06/21/20 0755 06/21/20 1114 06/22/20 0359 06/22/20 0833 06/23/20 0222 06/24/20 [place]  1.43  --   --   --   --   --   --   --   WBC 4.3  --    < > 4.8  --  9.0   --  7.2 17.9*  LATICACIDVEN 1.1  --   --  2.2* 2.6*  --  2.3*  --   --    < > = values in this interval not displayed.    Liver Function Tests: Recent Labs  Lab 06/20/20 1437 06/22/20 0359 06/23/20 0222 06/24/20 0334  AST 75* 86* 57* 42*  ALT 44 41 35 26  ALKPHOS 59 60 65 76  BILITOT 1.4* 0.9 1.1 0.7  PROT 7.7 6.6 6.8 5.9*  ALBUMIN 4.3 3.3* 3.3* 2.9*   No results for input(s): LIPASE, AMYLASE in the last 168 hours. Recent Labs  Lab 06/23/20 0832  AMMONIA 24    ABG    Component Value Date/Time   PHART 7.45 06/23/2020 2100   PCO2ART 45 06/23/2020 2100   PO2ART 243 (H) 06/23/2020 2100   HCO3 31.3 (H) 06/23/2020 2100   TCO2 32 05/05/2020 0200   ACIDBASEDEF 6.4 (H) 06/21/2020 0120   O2SAT 99.9 06/23/2020 2100     Coagulation Profile: No results for input(s): INR, PROTIME in the last 168 hours.  Cardiac Enzymes: No results for input(s): CKTOTAL, CKMB, CKMBINDEX, TROPONINI in the last 168 hours.  HbA1C: Hgb A1c MFr Bld  Date/Time Value Ref Range Status  06/21/2020 03:23 AM 8.2 (H) 4.8 - 5.6 % Final    Comment:    (NOTE) Pre diabetes:          5.7%-6.4%  Diabetes:              >6.4%  Glycemic control for   <7.0% adults with diabetes     CBG: Recent Labs  Lab 06/23/20 1855 06/23/20 1930 06/23/20 2302 06/24/20 0412 06/24/20 0738  GLUCAP 111* 102* 132* 87 107*    Review of Systems:   REVIEW OF SYSTEMS  PATIENT IS UNABLE TO PROVIDE COMPLETE REVIEW OF SYSTEM S DUE TO SEVERE CRITICAL ILLNESS AND ENCEPHALOPATHY   Past Medical History:  She,  has a past medical history of Anxiety, Asthma, Depression, Diabetes mellitus ([place]), Esophageal stricture, Gastroparesis, GERD (gastroesophageal reflux disease), Glaucoma, Graves disease ([id number]), Helicobacter pylori infection, Hyperlipidemia, Hypertension, Hypothyroidism, Insulin pump in place, Nephrolithiasis (2010), Neuropathy, Osteoporosis, Sleep apnea, Sleep apnea with use of continuous positive airway pressure  (CPAP), and Vitamin D deficiency.   Surgical History:   Past Surgical History:  Procedure Laterality Date  . ABDOMINAL AORTOGRAM W/LOWER EXTREMITY Bilateral 01/01/2019   Procedure: ABDOMINAL AORTOGRAM W/LOWER EXTREMITY;  Surgeon: Lorretta Harp, MD;  Location: [facility];  Service: Cardiovascular;  Laterality: Bilateral;  . BREAST EXCISIONAL BIOPSY    . BREAST LUMPECTOMY Left    LEFT-BENIGN  . NASAL SEPTOPLASTY W/ TURBINOPLASTY Bilateral 09/07/2013   Procedure: SEPTOPLASTY, BILATERAL TURBINATE RESECTION ;  Surgeon: Ascencion Dike, MD;  Location: [place] [facility];  Service: ENT;  Laterality: Bilateral;  . PERIPHERAL VASCULAR INTERVENTION Bilateral 01/01/2019   Procedure: PERIPHERAL VASCULAR INTERVENTION;  Surgeon: Lorretta Harp, MD;  Location: [facility];  Service: Cardiovascular;  Laterality: Bilateral;  . UPPER GASTROINTESTINAL ENDOSCOPY    . VAGINAL HYSTERECTOMY  2006     Social History:   reports that she has been smoking cigarettes. She has a 12.50 pack-year smoking history. She has never used smokeless tobacco. She reports that she does not drink  alcohol and does not use drugs.   Family History:  Her family history includes Alcoholism in her father; Diabetes in her father; Heart attack in her father; Irritable bowel syndrome in her sister; Liver cancer in her mother; Lung cancer in her mother. There is no history of Colon cancer, Esophageal cancer, Rectal cancer, or Stomach cancer.   Allergies Allergies  Allergen Reactions  . Mirtazapine Rash    And hives  . Clindamycin Hives  . Other Hives    Tylox   . Sulfonamide Derivatives Hives  . Lipitor [Atorvastatin] Other (See Comments)    MUSCLE ACHES  . Restasis [Cyclosporine]     Pt stated, "My eyes turned red on the inside and outside of eye; burning sensation"  . Topamax [Topiramate] Itching     Home Medications  Prior to Admission medications   Medication Sig Start Date End Date Taking?  Authorizing Provider  albuterol (PROVENTIL) (2.5 MG/3ML) 0.083% nebulizer solution Take 2.5 mg by nebulization every 4 (four) hours as needed for wheezing or shortness of breath.  03/18/15  Yes [provider]  ALPRAZolam Duanne Moron) 0.5 MG tablet Take 0.5 mg by mouth 3 (three) times daily as needed for anxiety.  10/08/12  Yes [provider]  amLODipine (NORVASC) 5 MG tablet Take 1 tablet (5 mg total) by mouth daily. Patient taking differently: Take 5 mg by mouth in the morning and at bedtime. 01/21/19  Yes Lorretta Harp, MD  busPIRone (BUSPAR) 15 MG tablet Take 15 mg by mouth 2 (two) times daily. 05/25/20  Yes [provider]  clopidogrel (PLAVIX) 75 MG tablet TAKE 1 TABLET (75 MG TOTAL) BY MOUTH DAILY WITH BREAKFAST. 12/24/19  Yes Lorretta Harp, MD  DULoxetine (CYMBALTA) 30 MG capsule Take 30 mg by mouth every morning. Takes 90 mg total 01/22/18  Yes [provider]  DULoxetine (CYMBALTA) 60 MG capsule Take 60 mg by mouth every morning. Takes 90 mg total   Yes [provider]  estradiol (ESTRACE) 0.5 MG tablet Take 0.5 mg by mouth daily. 04/07/20  Yes [provider]  famotidine (PEPCID) 40 MG tablet Take 1 tablet (40 mg total) by mouth at bedtime. 02/24/20  Yes Irene Shipper, MD  folic acid (FOLVITE) 1 MG tablet Take 1 tablet by mouth daily. 06/23/19  Yes [provider]  hydrochlorothiazide (MICROZIDE) 12.5 MG capsule TAKE 1 CAPSULE EVERY DAY Patient taking differently: Take 12.5 mg by mouth daily. 02/25/20  Yes Lorretta Harp, MD  ibandronate (BONIVA) 150 MG tablet Take 150 mg by mouth every 30 (thirty) days. 03/23/20  Yes [provider]  Insulin Human (INSULIN PUMP) SOLN Inject into the skin.   Yes [provider]  lansoprazole (PREVACID) 30 MG capsule Take 1 capsule (30 mg total) by mouth in the morning and at bedtime. 12/04/19  Yes Irene Shipper, MD  latanoprost (XALATAN) 0.005 % ophthalmic solution Place 1 drop  into both eyes at bedtime. 12/26/17  Yes [provider]  levocetirizine (XYZAL) 5 MG tablet Take 5 mg by mouth every morning.    Yes [provider]  levothyroxine (SYNTHROID, LEVOTHROID) 112 MCG tablet Take 112 mcg by mouth daily before breakfast.  01/07/18  Yes [provider]  losartan (COZAAR) 100 MG tablet Take 100 mg by mouth at bedtime.    Yes [provider]  methotrexate (RHEUMATREX) 2.5 MG tablet 5 tablets by moth twice a day once a week 06/23/19  Yes [provider]  metoprolol (TOPROL-XL)  200 MG 24 hr tablet Take 200 mg by mouth daily. One tablet by mouth once daily  09/26/12  Yes [provider]  montelukast (SINGULAIR) 10 MG tablet Take 10 mg by mouth at bedtime.   Yes [provider]  NOVOLOG 100 UNIT/ML injection Inject into the skin See admin instructions. Pt has Insulin pump 08/06/18  Yes [provider]  pregabalin (LYRICA) 75 MG capsule Take 150 mg by mouth at bedtime.  07/24/18  Yes [provider]  promethazine (PHENERGAN) 25 MG tablet Take 25 mg by mouth every 4 (four) hours as needed for nausea or vomiting. Every 4-6 hours as needed 08/30/14  Yes [provider]  rosuvastatin (CRESTOR) 20 MG tablet Take 20 mg by mouth at bedtime.   Yes [provider]  Vitamin D, Ergocalciferol, (DRISDOL) 50000 UNITS CAPS Take 50,000 Units by mouth every 7 (seven) days.   Yes [provider]  ACCU-CHEK COMPACT PLUS test strip 1 each by Other route as needed.  10/20/12   [provider]  ACCU-CHEK SOFTCLIX LANCETS lancets  05/19/18   [provider]  AMBULATORY NON [place] INSULIN PUMP USES AS DIRECTED    [provider]      Assessment & Plan:  55 yo obese white female with acute and severe resp failure with acute and severe ARDS with b/l pneumonia due to COVID pneumonia with acute COPD exacerbation  Severe COVID-19 infection, ARDS and  pneumonia/pneumonitis -Remdesevir antiviral - pharmacy protocol 5 d -vitamin C -zinc -decadron 6mg  IV daily  -Diuresis - Lasix 40 IV daily - monitor UOP - utilize external urinary catheter if possible - prone if patient can tolerate  -d/c hepatotoxic medications while on remdesevir -supportive care with ICU telemetry monitoring -PT/OT when possible -procalcitonin, CRP and ferritin trending   Hypernatremia, hypophasphatemia, hypokalemia -  D5LR with 20meq additive at 75cc/hr  - kphos  - phamD consult -electorlytes    ENCEPHALOPATHY   PROBABLE ACUTE DIASTOLIC CARDIAC FAILURE-  -oxygen as needed -Lasix as tolerated  ELECTROLYTES -follow labs as needed -replace as needed -pharmacy consultation and following   DVT/GI PRX ordered and assessed TRANSFUSIONS AS NEEDED MONITOR FSBS I Assessed the need for Labs I Assessed the need for Foley I Assessed the need for Central Venous Line Family Discussion when available I Assessed the need for Mobilization I made an Assessment of medications to be adjusted accordingly Safety Risk assessment completed  CASE DISCUSSED IN MULTIDISCIPLINARY ROUNDS WITH ICU TEAM    Critical Care Time devoted to patient care services described in this note is 32  minutes.   Overall, patient is critically ill, prognosis is guarded.  Patient with Multiorgan failure and at high risk for cardiac arrest and death.     Ottie Glazier, M.D.  [facility] [place]

## 2020-06-24 NOTE — Progress Notes (Signed)
*  PRELIMINARY RESULTS* Echocardiogram 2D Echocardiogram has been performed.  Ashley [name] 06/24/2020, 9:07 AM

## 2020-06-24 NOTE — Progress Notes (Signed)
Initial Nutrition Assessment  DOCUMENTATION CODES:   Not applicable  INTERVENTION:  Initiate Vital AF 1.2 Cal at 10 mL/hr and advance by 10 mL/hr every 12 hours to goal rate of 40 mL/hr (960 mL goal daily volume). Also provide PROSource TF 45 mL TID per tube. Provides 1272 kcal, 105 grams of protein, 778 mL H2O daily.  Provide MVI daily per tube.  Monitor magnesium, potassium, and phosphorus daily for at least 3 days, MD to replete as needed, as pt is at risk for refeeding syndrome.  NUTRITION DIAGNOSIS:   Inadequate oral intake related to inability to eat as evidenced by NPO status.  GOAL:   Patient will meet greater than or equal to 90% of their needs  MONITOR:   Vent status,Labs,Weight trends,TF tolerance,I & O's  REASON FOR ASSESSMENT:   Ventilator,Consult Enteral/tube feeding initiation and management  ASSESSMENT:   55 year old female with PMHx of DM, gastroparesis, esophageal stricture, HLD, OP, anxiety, GERD, H pylori infection, vitamin D deficiency, HTN, nephrolithiasis, Graves disease, depression, asthma, glaucoma, hypothyroidism, sleep apnea admitted with COVID-19 PNA, DKA, encephalopathy.   1/13 intubated  Patient is currently intubated on ventilator support MV: 7.8 L/min Temp (24hrs), Avg:98.6 F (37 C), Min:95.9 F (35.5 C), Max:99.68 F (37.6 C)  Medications reviewed and include: vitamin C 500 mg daily, free water 200 mL Q6hrs, Novolog 0-15 units Q4hrs, Lantus 30 units QHS, Solu-medrol 40 mg Q12hrs IV, zinc sulfate 220 mg daily, Unasyn, fentanyl gtt, Versed gtt, norepinephrine gtt at 6 mcg/min, KPhos 30 mmol IV.  Labs reviewed: CBG 87-204, Sodium 159, Potassium 3, Chloride 118, BUN 46, Creatinine 1.25, Phosphorus 1.7, Magnesium 2.6.  I/O: 1525 mL UOP yesterday (1 mL/kg/hr)  Weight trend: 65.5 kg on 1/12 (144.4 lbs); pt was 67.6 kg on 05/05/2020 so she has lost 2.1 kg (3.1% body weight) over almost 2 months, which is not significant for time  frame  Enteral Access: 16 Fr. OGT placed 1/13; terminates in stomach (pt likely with J-shaped stomach) per abdominal x-ray 1/13  Discussed with RN and on rounds. Plan is to begin tube feeds today. Will plan to start after KPhos repletion finished.  NUTRITION - FOCUSED PHYSICAL EXAM:  Flowsheet Row Most Recent Value  Orbital Region No depletion  Upper Arm Region No depletion  Thoracic and Lumbar Region No depletion  Buccal Region Unable to assess  Temple Region No depletion  Clavicle Bone Region No depletion  Clavicle and Acromion Bone Region No depletion  Scapular Bone Region Unable to assess  Dorsal Hand No depletion  Patellar Region Mild depletion  Anterior Thigh Region Mild depletion  Posterior Calf Region Mild depletion  Edema (RD Assessment) None  Hair Reviewed  Eyes Unable to assess  Mouth Unable to assess  Skin Reviewed  Nails Reviewed     Diet Order:   Diet Order    None     EDUCATION NEEDS:   No education needs have been identified at this time  Skin:  Skin Assessment: Reviewed RN Assessment  Last BM:  Unknown/PTA  Height:   Ht Readings from Last 1 Encounters:  06/22/20 4\' 11"  (1.499 m)   Weight:   Wt Readings from Last 1 Encounters:  06/22/20 65.5 kg   Ideal Body Weight:  44.7 kg  BMI:  Body mass index is 29.17 kg/m.  Estimated Nutritional Needs:   Kcal:  1258  Protein:  100-110 grams  Fluid:  >/= 2 L/day  Jacklynn Barnacle, MS, RD, LDN Pager number available on Amion

## 2020-06-24 NOTE — Progress Notes (Signed)
PHARMACY CONSULT NOTE - FOLLOW UP  Pharmacy Consult for Electrolyte Monitoring and Replacement   Recent Labs: Potassium (mmol/L)  Date Value  06/24/2020 3.8   Magnesium (mg/dL)  Date Value  06/24/2020 2.6 (H)   Calcium (mg/dL)  Date Value  06/24/2020 7.9 (L)   Albumin (g/dL)  Date Value  06/24/2020 2.9 (L)  01/09/2019 4.5   Phosphorus (mg/dL)  Date Value  06/24/2020 1.7 (L)   Sodium (mmol/L)  Date Value  06/24/2020 153 (H)  01/30/2019 142     Assessment: 55 year old female admitted with metabolic encephalopathy in setting of Covid and DKA/HHS. Patient required intubation 1/13. She has been on and off of the insulin drip. Currently remains off drip on subcu regimen. Pharmacy to manage electrolytes.  Patient on D5 with 40 K at 75 ml/hr x 24 hrs.  Goal of Therapy:  Electrolytes WNL  Plan:  Patient currently receiving K phos 30 mmol x 1 and started on D5 with 40 K at 75 ml/hr x 24 hrs for worsening hypernatremia and hypokalemia. Patient is also on free water flushes. Will recheck BMP at 2000 to reassess potassium following replacement and trend Na and replace as indicated. Plan to follow all electrolytes with morning labs.  Repeat K 3.8, Na 153 - follow up with AM labs  Paulina Fusi, PharmD, BCPS 06/24/2020 9:52 PM

## 2020-06-24 NOTE — Progress Notes (Addendum)
PHARMACY CONSULT NOTE - FOLLOW UP  Pharmacy Consult for Electrolyte Monitoring and Replacement   Recent Labs: Potassium (mmol/L)  Date Value  06/24/2020 3.0 (L)   Magnesium (mg/dL)  Date Value  06/24/2020 2.6 (H)   Calcium (mg/dL)  Date Value  06/24/2020 8.4 (L)   Albumin (g/dL)  Date Value  06/24/2020 2.9 (L)  01/09/2019 4.5   Phosphorus (mg/dL)  Date Value  06/24/2020 1.7 (L)   Sodium (mmol/L)  Date Value  06/24/2020 159 (H)  01/30/2019 142     Assessment: 55 year old female admitted with metabolic encephalopathy in setting of Covid and DKA/HHS. Patient required intubation 1/13. She has been on and off of the insulin drip. Currently remains off drip on subcu regimen. Pharmacy to manage electrolytes.  Patient on D5 with 40 K at 75 ml/hr x 24 hrs.  Goal of Therapy:  Electrolytes WNL  Plan:  Patient currently receiving K phos 30 mmol x 1 and started on D5 with 40 K at 75 ml/hr x 24 hrs for worsening hypernatremia and hypokalemia. Patient is also on free water flushes. Will recheck BMP at 2000 to reassess potassium following replacement and trend Na and replace as indicated. Plan to follow all electrolytes with morning labs.  Tawnya Crook ,PharmD Clinical Pharmacist 06/24/2020 3:22 PM

## 2020-06-25 ENCOUNTER — Inpatient Hospital Stay: Payer: Medicare HMO

## 2020-06-25 LAB — COMPREHENSIVE METABOLIC PANEL
ALT: 21 U/L (ref 0–44)
AST: 24 U/L (ref 15–41)
Albumin: 2.5 g/dL — ABNORMAL LOW (ref 3.5–5.0)
Alkaline Phosphatase: 74 U/L (ref 38–126)
Anion gap: 12 (ref 5–15)
BUN: 36 mg/dL — ABNORMAL HIGH (ref 6–20)
CO2: 27 mmol/L (ref 22–32)
Calcium: 8.1 mg/dL — ABNORMAL LOW (ref 8.9–10.3)
Chloride: 113 mmol/L — ABNORMAL HIGH (ref 98–111)
Creatinine, Ser: 0.93 mg/dL (ref 0.44–1.00)
GFR, Estimated: >60 mL/min (ref >60)
Glucose, Bld: 195 mg/dL — ABNORMAL HIGH (ref 70–99)
Potassium: 4 mmol/L (ref 3.5–5.1)
Sodium: 152 mmol/L — ABNORMAL HIGH (ref 135–145)
Total Bilirubin: 0.5 mg/dL (ref 0.3–1.2)
Total Protein: 5.3 g/dL — ABNORMAL LOW (ref 6.5–8.1)

## 2020-06-25 LAB — FIBRIN DERIVATIVES D-DIMER (ARMC ONLY): Fibrin derivatives D-dimer (ARMC): 728.92 ng/mL (FEU) — ABNORMAL HIGH (ref 0.00–499.00)

## 2020-06-25 LAB — CBC WITH DIFFERENTIAL/PLATELET
Abs Immature Granulocytes: 0.12 10*3/uL — ABNORMAL HIGH (ref 0.00–0.07)
Basophils Absolute: 0 10*3/uL (ref 0.0–0.1)
Basophils Relative: 0 %
Eosinophils Absolute: 0 10*3/uL (ref 0.0–0.5)
Eosinophils Relative: 0 %
HCT: 30.1 % — ABNORMAL LOW (ref 36.0–46.0)
Hemoglobin: 9.7 g/dL — ABNORMAL LOW (ref 12.0–15.0)
Immature Granulocytes: 1 %
Lymphocytes Relative: 10 %
Lymphs Abs: 1.5 10*3/uL (ref 0.7–4.0)
MCH: 30.7 pg (ref 26.0–34.0)
MCHC: 32.2 g/dL (ref 30.0–36.0)
MCV: 95.3 fL (ref 80.0–100.0)
Monocytes Absolute: 1 10*3/uL (ref 0.1–1.0)
Monocytes Relative: 7 %
Neutro Abs: 12.1 10*3/uL — ABNORMAL HIGH (ref 1.7–7.7)
Neutrophils Relative %: 82 %
Platelets: 339 10*3/uL (ref 150–400)
RBC: 3.16 MIL/uL — ABNORMAL LOW (ref 3.87–5.11)
RDW: 15.8 % — ABNORMAL HIGH (ref 11.5–15.5)
Smear Review: NORMAL
WBC: 14.6 10*3/uL — ABNORMAL HIGH (ref 4.0–10.5)
nRBC: 0.1 % (ref 0.0–0.2)

## 2020-06-25 LAB — GLUCOSE, CAPILLARY
Glucose-Capillary: 173 mg/dL — ABNORMAL HIGH (ref 70–99)
Glucose-Capillary: 190 mg/dL — ABNORMAL HIGH (ref 70–99)
Glucose-Capillary: 203 mg/dL — ABNORMAL HIGH (ref 70–99)
Glucose-Capillary: 204 mg/dL — ABNORMAL HIGH (ref 70–99)
Glucose-Capillary: 217 mg/dL — ABNORMAL HIGH (ref 70–99)
Glucose-Capillary: 253 mg/dL — ABNORMAL HIGH (ref 70–99)

## 2020-06-25 LAB — C-REACTIVE PROTEIN: CRP: 1.8 mg/dL — ABNORMAL HIGH (ref <1.0)

## 2020-06-25 LAB — PROCALCITONIN: Procalcitonin: 0.13 ng/mL

## 2020-06-25 LAB — MAGNESIUM: Magnesium: 2.4 mg/dL (ref 1.7–2.4)

## 2020-06-25 LAB — PHOSPHORUS: Phosphorus: 3.7 mg/dL (ref 2.5–4.6)

## 2020-06-25 MED ORDER — MIDAZOLAM BOLUS VIA INFUSION
2.0000 mg | Freq: Once | INTRAVENOUS | Status: AC
  Administered 2020-06-25: 2 mg via INTRAVENOUS

## 2020-06-25 NOTE — Progress Notes (Signed)
PHARMACY CONSULT NOTE - FOLLOW UP  Pharmacy Consult for Electrolyte Monitoring and Replacement   Recent Labs: Potassium (mmol/L)  Date Value  06/25/2020 4.0   Magnesium (mg/dL)  Date Value  06/25/2020 2.4   Calcium (mg/dL)  Date Value  06/25/2020 8.1 (L)   Albumin (g/dL)  Date Value  06/25/2020 2.5 (L)  01/09/2019 4.5   Phosphorus (mg/dL)  Date Value  06/25/2020 3.7   Sodium (mmol/L)  Date Value  06/25/2020 152 (H)  01/30/2019 142     Assessment: 55 year old female admitted with metabolic encephalopathy in setting of Covid and DKA/HHS. Patient required intubation 1/13. She has been on and off of the insulin drip. Currently remains off drip on subcu regimen. Pharmacy to manage electrolytes.  Patient on D5 with 40 K at 75 ml/hr x 24 hrs.  Goal of Therapy:  Electrolytes WNL  Plan:  K 4.0  Mag 2.4  Phos 3.7  Scr 0.93  Na 152 -no replacement at this time. F/u with am labs  Chinita Greenland PharmD Clinical Pharmacist 06/25/2020

## 2020-06-25 NOTE — Progress Notes (Signed)
Inpatient Diabetes Program Recommendations  AACE/ADA: New Consensus Statement on Inpatient Glycemic Control (2015)  Target Ranges:  Prepandial:   less than 140 mg/dL      Peak postprandial:   less than 180 mg/dL (1-2 hours)      Critically ill patients:  140 - 180 mg/dL   Lab Results  Component Value Date   GLUCAP 253 (H) 06/25/2020   HGBA1C 8.2 (H) 06/21/2020    Review of Glycemic Control  Diabetes history: DM1 Outpatient Diabetes medications: Insulin pump with Novolog Current orders for Inpatient glycemic control: Lantus 30 units QHS, Novolog 0-15 units Q4H On Solumedrol 40 mg Q12H  CBGs above goal. Needs TF coverage.  Inpatient Diabetes Program Recommendations:     Add Novolog 3 units Q4H for TF coverage.  Continue to follow.  Thank you. Lorenda Peck, RD, LDN, CDE Inpatient Diabetes Coordinator [phone]

## 2020-06-25 NOTE — Progress Notes (Signed)
Attempted WUA. Pt pulled at tube, very agitated and combative. Unable to follow simple commands. Ventilator dyssynchrony and sats drop. Sedation resumed and sedation bolus administered.

## 2020-06-25 NOTE — Progress Notes (Signed)
NAME:  Ashley [name], MRN:  [id number], DOB:  1966-05-02, LOS: 4 ADMISSION DATE:  06/20/2020, CONSULTATION DATE:  06/23/2020 REFERRING MD:  Dr. Georgeann Oppenheim, CHIEF COMPLAINT:  Altered Mental Status, COVID-19 Pneumonia  Brief History:  55 y.o. Female admitted with Acute Metabolic Encephalopathy in the setting of COVID-19 Pneumonia & DKA/HHS.  History of Present Illness:  55 year old female extensive past medical history as per medical record who presents for lethargy, subjective fevers at home, decreased level of consciousness. Found to have multifocal pneumonia in the setting of COVID-19 infection. Also with elevated procalcitonin concerning for bacterial pulm infection. Also presented with DKA/HHS. She has been on intravenous insulin infusion via Endo tool protocol. Anion gap closed. Glycemic control improved. Will transition off of IV insulin.   06/24/20- patient on MV critically ill.  Labwork this am with hypernatremia, aki, transaminitis, hypokalemia. CXR with bilateral air space opacification worse at RLL.   Weaning levophed.  I spoke with husband Braeli Purves and reviewed care plan with him, he will be coming in this afternoon to see patient.  06/25/20- patient failed SBT today. WUA required ativan due to aggitation.   Past Medical History:   Active Ambulatory Problems    Diagnosis Date Noted  . DIAB W/O MENTION COMP TYPE I [JUV TYPE] UNCNTRL 02/28/2009  . GERD 02/28/2009  . Gastroparesis 03/22/2009  . DYSPHAGIA UNSPECIFIED 02/28/2009  . DYSPHAGIA 03/22/2009  . ABDOMINAL PAIN-EPIGASTRIC 02/28/2009  . Nausea with vomiting 10/22/2012  . Diarrhea 10/22/2012  . Dyspnea and respiratory abnormality 04/26/2015  . Chest tightness 04/26/2015  . Hoarseness of voice 04/26/2015  . OSA and COPD overlap syndrome (HCC) 04/02/2018  . Excessive daytime sleepiness 04/02/2018  . Delayed sleep phase syndrome 04/02/2018  . Moderate persistent asthma 04/02/2018  . Loud snoring 05/19/2018  .  OSA on CPAP 10/08/2018  . Essential hypertension 12/25/2018  . Hyperlipidemia 12/25/2018  . Peripheral arterial disease (HCC) 12/25/2018  . Claudication in peripheral vascular disease (HCC) 01/01/2019  . Tobacco abuse 02/24/2020  . Hypertensive disorder 12/09/2018   Resolved Ambulatory Problems    Diagnosis Date Noted  . No Resolved Ambulatory Problems   Past Medical History:  Diagnosis Date  . Anxiety   . Asthma   . Depression   . Diabetes mellitus (HCC)   . Esophageal stricture   . GERD (gastroesophageal reflux disease)   . Glaucoma   . Graves disease 02/2010  . Helicobacter pylori infection   . Hypertension   . Hypothyroidism   . Insulin pump in place   . Nephrolithiasis 2010  . Neuropathy   . Osteoporosis   . Sleep apnea   . Sleep apnea with use of continuous positive airway pressure (CPAP)   . Vitamin D deficiency      Significant Hospital Events:  1/10: Presented to ED; Hospitalist admit to [facility] 1/13: PCCM consult  Consults:  Hospitalist (primary service) Neurology PCCM Diabetes Coordinator  Procedures:  N/A  Significant Diagnostic Tests:  1/11: CT Head w/o Contrast>>Mild chronic ischemic white matter disease. No acute intracranial abnormality seen. 1/11: CTA Chest>>1. Extensive motion artifact limits assessment without signs of central or lobar level pulmonary embolism. 2. Diffuse areas of consolidative change, nodularity and ground-glass. Findings show midlung and basilar predominance. Findings of multifocal pneumonia in the setting of COVID-19 infection. 3. Mild mediastinal and hilar nodal enlargement likely reactive. 4. Aortic atherosclerosis. 1/11: MR Brain w/o contrast>>No acute abnormality. Mild white matter changes similar to 2010. Mild atrophy with progression since 2010.  Micro Data:  1/11: SARS-CoV-2 PCR>> Positive 1/11: Influenza A&B PCR>>negative  Antimicrobials:   Anti-infectives (From admission, onward)   Start      Dose/Rate Route Frequency Ordered Stop   06/23/20 1800  Ampicillin-Sulbactam (UNASYN) 3 g in sodium chloride 0.9 % 100 mL IVPB        3 g 200 mL/hr over 30 Minutes Intravenous Every 6 hours 06/23/20 1519     06/22/20 2200  ceFEPIme (MAXIPIME) 2 g in sodium chloride 0.9 % 100 mL IVPB  Status:  Discontinued        2 g 200 mL/hr over 30 Minutes Intravenous Every 8 hours 06/22/20 1138 06/23/20 1519   06/22/20 2200  vancomycin (VANCOREADY) IVPB 750 mg/150 mL  Status:  Discontinued        750 mg 150 mL/hr over 60 Minutes Intravenous Every 12 hours 06/22/20 1138 06/23/20 1519   06/22/20 1000  remdesivir 100 mg in sodium chloride 0.9 % 100 mL IVPB  Status:  Discontinued       "Followed by" Linked Group Details   100 mg 200 mL/hr over 30 Minutes Intravenous Daily 06/21/20 0126 06/21/20 0630   06/22/20 1000  vancomycin (VANCOCIN) IVPB 1000 mg/200 mL premix  Status:  Discontinued        1,000 mg 200 mL/hr over 60 Minutes Intravenous Every 24 hours 06/21/20 0816 06/22/20 1139   06/21/20 1200  ceFEPIme (MAXIPIME) 2 g in sodium chloride 0.9 % 100 mL IVPB  Status:  Discontinued        2 g 200 mL/hr over 30 Minutes Intravenous Every 12 hours 06/21/20 0816 06/22/20 1139   06/21/20 1000  remdesivir 100 mg in sodium chloride 0.9 % 100 mL IVPB  Status:  Discontinued       "Followed by" Linked Group Details   100 mg 200 mL/hr over 30 Minutes Intravenous Daily 06/20/20 2359 06/21/20 0002   06/21/20 1000  remdesivir 100 mg in sodium chloride 0.9 % 100 mL IVPB       "Followed by" Linked Group Details   100 mg 200 mL/hr over 30 Minutes Intravenous Daily 06/21/20 0002 06/24/20 0948   06/21/20 1000  cefTRIAXone (ROCEPHIN) 2 g in sodium chloride 0.9 % 100 mL IVPB  Status:  Discontinued        2 g 200 mL/hr over 30 Minutes Intravenous Every 24 hours 06/21/20 0639 06/21/20 0723   06/21/20 1000  azithromycin (ZITHROMAX) 500 mg in sodium chloride 0.9 % 250 mL IVPB  Status:  Discontinued        500 mg 250 mL/hr over  60 Minutes Intravenous Every 24 hours 06/21/20 0639 06/21/20 0723   06/21/20 0945  vancomycin (VANCOREADY) IVPB 500 mg/100 mL        500 mg 100 mL/hr over 60 Minutes Intravenous  Once 06/21/20 0932 06/21/20 1126   06/21/20 0900  vancomycin (VANCOREADY) IVPB 1500 mg/300 mL  Status:  Discontinued        1,500 mg 150 mL/hr over 120 Minutes Intravenous  Once 06/21/20 0816 06/21/20 0932   06/21/20 0645  vancomycin (VANCOCIN) IVPB 1000 mg/200 mL premix        1,000 mg 200 mL/hr over 60 Minutes Intravenous  Once 06/21/20 0632 06/21/20 0834   06/21/20 0645  piperacillin-tazobactam (ZOSYN) IVPB 3.375 g        3.375 g 100 mL/hr over 30 Minutes Intravenous  Once 06/21/20 0632 06/21/20 0716   06/21/20 0645  cefTRIAXone (ROCEPHIN) 2 g in sodium chloride 0.9 % 100 mL IVPB  Status:  Discontinued        2 g 200 mL/hr over 30 Minutes Intravenous Every 24 hours 06/21/20 0634 06/21/20 0634   06/21/20 0645  azithromycin (ZITHROMAX) 500 mg in sodium chloride 0.9 % 250 mL IVPB  Status:  Discontinued        500 mg 250 mL/hr over 60 Minutes Intravenous Every 24 hours 06/21/20 0634 06/21/20 0634   06/21/20 0130  remdesivir 200 mg in sodium chloride 0.9% 250 mL IVPB  Status:  Discontinued       "Followed by" Linked Group Details   200 mg 580 mL/hr over 30 Minutes Intravenous Once 06/21/20 0126 06/21/20 0630   06/21/20 0015  remdesivir 200 mg in sodium chloride 0.9% 250 mL IVPB       "Followed by" Linked Group Details   200 mg 580 mL/hr over 30 Minutes Intravenous Once 06/21/20 0002 06/21/20 0251   06/21/20 0000  remdesivir 200 mg in sodium chloride 0.9% 250 mL IVPB  Status:  Discontinued       "Followed by" Linked Group Details   200 mg 580 mL/hr over 30 Minutes Intravenous Once 06/20/20 2359 06/21/20 0002        Objective   Blood pressure 136/65, pulse 88, temperature 99.14 F (37.3 C), resp. rate 17, height 4\' 11"  (1.499 m), weight 63.4 kg, SpO2 95 %.    Vent Mode: PRVC FiO2 (%):  [40 %] 40 % Set  Rate:  [16 bmp] 16 bmp Vt Set:  [480 mL] 480 mL PEEP:  [5 cmH20-8 cmH20] 5 cmH20 Plateau Pressure:  [24 cmH20-25 cmH20] 24 cmH20   Intake/Output Summary (Last 24 hours) at 06/25/2020 1408 Last data filed at 06/25/2020 1133 Gross per 24 hour  Intake 4227.22 ml  Output 1035 ml  Net 3192.22 ml   Filed Weights   06/20/20 1432 06/22/20 0622 06/25/20 0500  Weight: 67.6 kg 65.5 kg 63.4 kg    PHYSICAL EXAMINATION:  GENERAL:critically ill appearing, +resp distress HEAD: Normocephalic, atraumatic.  EYES: Pupils equal, round, reactive to light.  No scleral icterus.  MOUTH: Moist mucosal membrane. NECK: Supple. No thyromegaly. No nodules. No JVD.  PULMONARY: +rhonchi, CARDIOVASCULAR: S1 and S2. Regular rate and rhythm. No murmurs, rubs, or gallops.  GASTROINTESTINAL: Soft, nontender, -distended. Positive bowel sounds.  MUSCULOSKELETAL: No swelling, clubbing, or edema.  NEUROLOGIC: awake, agitated and confused SKIN:intact,warm,dry  Labs   CBC: Recent Labs  Lab 06/21/20 0755 06/22/20 0359 06/23/20 0222 06/24/20 0334 06/25/20 0614  WBC 4.8 9.0 7.2 17.9* 14.6*  NEUTROABS 3.6 7.3 5.8 15.1* 12.1*  HGB 10.6* 12.8 12.6 11.2* 9.7*  HCT 30.7* 37.2 37.7 33.7* 30.1*  MCV 90.0 91.0 91.5 93.1 95.3  PLT 132* 151 196 321 [id number]    Basic Metabolic Panel: Recent Labs  Lab 06/22/20 0359 06/23/20 0222 06/24/20 0334 06/24/20 2029 06/25/20 0614  NA 145 152* 159* 153* 152*  K 3.5 3.4* 3.0* 3.8 4.0  CL 109 113* 118* 112* 113*  CO2 26 27 30 28 27   GLUCOSE 260* 211* 78 280* 195*  BUN 21* 30* 46* 41* 36*  CREATININE 0.93 0.95 1.25* 1.12* 0.93  CALCIUM 8.3* 8.7* 8.4* 7.9* 8.1*  MG 2.2 2.6* 2.6*  --  2.4  PHOS  --   --  1.7*  --  3.7   GFR: Estimated Creatinine Clearance: 56 mL/min (by C-G formula based on SCr of 0.93 mg/dL). Recent Labs  Lab 06/20/20 1437 06/21/20 0046 06/21/20 0755 06/21/20 1114 06/22/20 0359 06/22/20 0833 06/23/20 0222 06/24/20  4098 06/25/20 0614  PROCALCITON  --   1.43  --   --   --   --   --  0.70 0.13  WBC 4.3  --  4.8  --  9.0  --  7.2 17.9* 14.6*  LATICACIDVEN 1.1  --  2.2* 2.6*  --  2.3*  --   --   --     Liver Function Tests: Recent Labs  Lab 06/20/20 1437 06/22/20 0359 06/23/20 0222 06/24/20 0334 06/25/20 0614  AST 75* 86* 57* 42* 24  ALT 44 41 35 26 21  ALKPHOS 59 60 65 76 74  BILITOT 1.4* 0.9 1.1 0.7 0.5  PROT 7.7 6.6 6.8 5.9* 5.3*  ALBUMIN 4.3 3.3* 3.3* 2.9* 2.5*   No results for input(s): LIPASE, AMYLASE in the last 168 hours. Recent Labs  Lab 06/23/20 0832  AMMONIA 24    ABG    Component Value Date/Time   PHART 7.45 06/23/2020 2100   PCO2ART 45 06/23/2020 2100   PO2ART 243 (H) 06/23/2020 2100   HCO3 31.3 (H) 06/23/2020 2100   TCO2 32 05/05/2020 0200   ACIDBASEDEF 6.4 (H) 06/21/2020 0120   O2SAT 99.9 06/23/2020 2100     Coagulation Profile: No results for input(s): INR, PROTIME in the last 168 hours.  Cardiac Enzymes: No results for input(s): CKTOTAL, CKMB, CKMBINDEX, TROPONINI in the last 168 hours.  HbA1C: Hgb A1c MFr Bld  Date/Time Value Ref Range Status  06/21/2020 03:23 AM 8.2 (H) 4.8 - 5.6 % Final    Comment:    (NOTE) Pre diabetes:          5.7%-6.4%  Diabetes:              >6.4%  Glycemic control for   <7.0% adults with diabetes     CBG: Recent Labs  Lab 06/24/20 1944 06/24/20 2319 06/25/20 0304 06/25/20 0806 06/25/20 1122  GLUCAP 259* 220* 173* 217* 253*    Review of Systems:   REVIEW OF SYSTEMS  PATIENT IS UNABLE TO PROVIDE COMPLETE REVIEW OF SYSTEM S DUE TO SEVERE CRITICAL ILLNESS AND ENCEPHALOPATHY   Past Medical History:  She,  has a past medical history of Anxiety, Asthma, Depression, Diabetes mellitus ([place]), Esophageal stricture, Gastroparesis, GERD (gastroesophageal reflux disease), Glaucoma, Graves disease (04/9146), Helicobacter pylori infection, Hyperlipidemia, Hypertension, Hypothyroidism, Insulin pump in place, Nephrolithiasis (2010), Neuropathy, Osteoporosis, Sleep  apnea, Sleep apnea with use of continuous positive airway pressure (CPAP), and Vitamin D deficiency.   Surgical History:   Past Surgical History:  Procedure Laterality Date  . ABDOMINAL AORTOGRAM W/LOWER EXTREMITY Bilateral 01/01/2019   Procedure: ABDOMINAL AORTOGRAM W/LOWER EXTREMITY;  Surgeon: Lorretta Harp, MD;  Location: [facility];  Service: Cardiovascular;  Laterality: Bilateral;  . BREAST EXCISIONAL BIOPSY    . BREAST LUMPECTOMY Left    LEFT-BENIGN  . NASAL SEPTOPLASTY W/ TURBINOPLASTY Bilateral 09/07/2013   Procedure: SEPTOPLASTY, BILATERAL TURBINATE RESECTION ;  Surgeon: Ascencion Dike, MD;  Location: [facility];  Service: ENT;  Laterality: Bilateral;  . PERIPHERAL VASCULAR INTERVENTION Bilateral 01/01/2019   Procedure: PERIPHERAL VASCULAR INTERVENTION;  Surgeon: Lorretta Harp, MD;  Location: [facility] [place] [facility];  Service: Cardiovascular;  Laterality: Bilateral;  . UPPER GASTROINTESTINAL ENDOSCOPY    . VAGINAL HYSTERECTOMY  2006     Social History:   reports that she has been smoking cigarettes. She has a 12.50 pack-year smoking history. She has never used smokeless tobacco. She reports that she does not drink alcohol and  does not use drugs.   Family History:  Her family history includes Alcoholism in her father; Diabetes in her father; Heart attack in her father; Irritable bowel syndrome in her sister; Liver cancer in her mother; Lung cancer in her mother. There is no history of Colon cancer, Esophageal cancer, Rectal cancer, or Stomach cancer.   Allergies Allergies  Allergen Reactions  . Mirtazapine Rash    And hives  . Clindamycin Hives  . Other Hives    Tylox   . Sulfonamide Derivatives Hives  . Lipitor [Atorvastatin] Other (See Comments)    MUSCLE ACHES  . Restasis [Cyclosporine]     Pt stated, "My eyes turned red on the inside and outside of eye; burning sensation"  . Topamax [Topiramate] Itching     Home Medications  Prior to Admission  medications   Medication Sig Start Date End Date Taking? Authorizing Provider  albuterol (PROVENTIL) (2.5 MG/3ML) 0.083% nebulizer solution Take 2.5 mg by nebulization every 4 (four) hours as needed for wheezing or shortness of breath.  03/18/15  Yes [provider]  ALPRAZolam Duanne Moron) 0.5 MG tablet Take 0.5 mg by mouth 3 (three) times daily as needed for anxiety.  10/08/12  Yes [provider]  amLODipine (NORVASC) 5 MG tablet Take 1 tablet (5 mg total) by mouth daily. Patient taking differently: Take 5 mg by mouth in the morning and at bedtime. 01/21/19  Yes Lorretta Harp, MD  busPIRone (BUSPAR) 15 MG tablet Take 15 mg by mouth 2 (two) times daily. 05/25/20  Yes [provider]  clopidogrel (PLAVIX) 75 MG tablet TAKE 1 TABLET (75 MG TOTAL) BY MOUTH DAILY WITH BREAKFAST. 12/24/19  Yes Lorretta Harp, MD  DULoxetine (CYMBALTA) 30 MG capsule Take 30 mg by mouth every morning. Takes 90 mg total 01/22/18  Yes [provider]  DULoxetine (CYMBALTA) 60 MG capsule Take 60 mg by mouth every morning. Takes 90 mg total   Yes [provider]  estradiol (ESTRACE) 0.5 MG tablet Take 0.5 mg by mouth daily. 04/07/20  Yes [provider]  famotidine (PEPCID) 40 MG tablet Take 1 tablet (40 mg total) by mouth at bedtime. 02/24/20  Yes Irene Shipper, MD  folic acid (FOLVITE) 1 MG tablet Take 1 tablet by mouth daily. 06/23/19  Yes [provider]  hydrochlorothiazide (MICROZIDE) 12.5 MG capsule TAKE 1 CAPSULE EVERY DAY Patient taking differently: Take 12.5 mg by mouth daily. 02/25/20  Yes Lorretta Harp, MD  ibandronate (BONIVA) 150 MG tablet Take 150 mg by mouth every 30 (thirty) days. 03/23/20  Yes [provider]  Insulin Human (INSULIN PUMP) SOLN Inject into the skin.   Yes [provider]  lansoprazole (PREVACID) 30 MG capsule Take 1 capsule (30 mg total) by mouth in the morning and at bedtime. 12/04/19  Yes Irene Shipper, MD   latanoprost (XALATAN) 0.005 % ophthalmic solution Place 1 drop into both eyes at bedtime. 12/26/17  Yes [provider]  levocetirizine (XYZAL) 5 MG tablet Take 5 mg by mouth every morning.    Yes [provider]  levothyroxine (SYNTHROID, LEVOTHROID) 112 MCG tablet Take 112 mcg by mouth daily before breakfast.  01/07/18  Yes [provider]  losartan (COZAAR) 100 MG tablet Take 100 mg by mouth at bedtime.    Yes [provider]  methotrexate (RHEUMATREX) 2.5 MG tablet 5 tablets by moth twice a day once a week 06/23/19  Yes [provider]  metoprolol (TOPROL-XL) 200 MG  24 hr tablet Take 200 mg by mouth daily. One tablet by mouth once daily  09/26/12  Yes [provider]  montelukast (SINGULAIR) 10 MG tablet Take 10 mg by mouth at bedtime.   Yes [provider]  NOVOLOG 100 UNIT/ML injection Inject into the skin See admin instructions. Pt has Insulin pump 08/06/18  Yes [provider]  pregabalin (LYRICA) 75 MG capsule Take 150 mg by mouth at bedtime.  07/24/18  Yes [provider]  promethazine (PHENERGAN) 25 MG tablet Take 25 mg by mouth every 4 (four) hours as needed for nausea or vomiting. Every 4-6 hours as needed 08/30/14  Yes [provider]  rosuvastatin (CRESTOR) 20 MG tablet Take 20 mg by mouth at bedtime.   Yes [provider]  Vitamin D, Ergocalciferol, (DRISDOL) 50000 UNITS CAPS Take 50,000 Units by mouth every 7 (seven) days.   Yes [provider]  ACCU-CHEK COMPACT PLUS test strip 1 each by Other route as needed.  10/20/12   [provider]  ACCU-CHEK SOFTCLIX LANCETS lancets  05/19/18   [provider]  AMBULATORY NON [place] INSULIN PUMP USES AS DIRECTED    [provider]      Assessment & Plan:  55 yo obese white female with acute and severe resp failure with acute and severe ARDS with b/l pneumonia due to COVID pneumonia with  acute COPD exacerbation  Severe COVID-19 infection, ARDS and pneumonia/pneumonitis -Remdesevir antiviral - pharmacy protocol 5 d -vitamin C -zinc -decadron 6mg  IV daily  -Diuresis - Lasix 40 IV daily - monitor UOP - utilize external urinary catheter if possible - prone if patient can tolerate  -d/c hepatotoxic medications while on remdesevir -supportive care with ICU telemetry monitoring -PT/OT when possible -procalcitonin, CRP and ferritin trending   Hypernatremia, hypophasphatemia, hypokalemia -  D5LR with 94meq additive at 75cc/hr  - kphos  - phamD consult -electorlytes    ENCEPHALOPATHY   PROBABLE ACUTE DIASTOLIC CARDIAC FAILURE-  -oxygen as needed -Lasix as tolerated  ELECTROLYTES -follow labs as needed -replace as needed -pharmacy consultation and following   DVT/GI PRX ordered and assessed TRANSFUSIONS AS NEEDED MONITOR FSBS I Assessed the need for Labs I Assessed the need for Foley I Assessed the need for Central Venous Line Family Discussion when available I Assessed the need for Mobilization I made an Assessment of medications to be adjusted accordingly Safety Risk assessment completed  CASE DISCUSSED IN MULTIDISCIPLINARY ROUNDS WITH ICU TEAM    Critical Care Time devoted to patient care services described in this note is 32  minutes.   Overall, patient is critically ill, prognosis is guarded.  Patient with Multiorgan failure and at high risk for cardiac arrest and death.     Ottie Glazier, M.D.  [facility] [place]

## 2020-06-26 LAB — FIBRIN DERIVATIVES D-DIMER (ARMC ONLY): Fibrin derivatives D-dimer (ARMC): 645.39 ng/mL (FEU) — ABNORMAL HIGH (ref 0.00–499.00)

## 2020-06-26 LAB — GLUCOSE, CAPILLARY
Glucose-Capillary: 220 mg/dL — ABNORMAL HIGH (ref 70–99)
Glucose-Capillary: 217 mg/dL — ABNORMAL HIGH (ref 70–99)
Glucose-Capillary: 191 mg/dL — ABNORMAL HIGH (ref 70–99)
Glucose-Capillary: 202 mg/dL — ABNORMAL HIGH (ref 70–99)
Glucose-Capillary: 249 mg/dL — ABNORMAL HIGH (ref 70–99)

## 2020-06-26 LAB — COMPREHENSIVE METABOLIC PANEL
ALT: 18 U/L (ref 0–44)
AST: 24 U/L (ref 15–41)
Albumin: 2.3 g/dL — ABNORMAL LOW (ref 3.5–5.0)
Alkaline Phosphatase: 76 U/L (ref 38–126)
Anion gap: 12 (ref 5–15)
BUN: 36 mg/dL — ABNORMAL HIGH (ref 6–20)
CO2: 28 mmol/L (ref 22–32)
Calcium: 8.4 mg/dL — ABNORMAL LOW (ref 8.9–10.3)
Chloride: 111 mmol/L (ref 98–111)
Creatinine, Ser: 0.84 mg/dL (ref 0.44–1.00)
GFR, Estimated: >60 mL/min (ref >60)
Glucose, Bld: 245 mg/dL — ABNORMAL HIGH (ref 70–99)
Potassium: 4.2 mmol/L (ref 3.5–5.1)
Sodium: 151 mmol/L — ABNORMAL HIGH (ref 135–145)
Total Bilirubin: 0.7 mg/dL (ref 0.3–1.2)
Total Protein: 5.5 g/dL — ABNORMAL LOW (ref 6.5–8.1)

## 2020-06-26 LAB — CBC WITH DIFFERENTIAL/PLATELET
Abs Immature Granulocytes: 0.19 10*3/uL — ABNORMAL HIGH (ref 0.00–0.07)
Basophils Absolute: 0 10*3/uL (ref 0.0–0.1)
Basophils Relative: 0 %
Eosinophils Absolute: 0 10*3/uL (ref 0.0–0.5)
Eosinophils Relative: 0 %
HCT: 30.6 % — ABNORMAL LOW (ref 36.0–46.0)
Hemoglobin: 9.6 g/dL — ABNORMAL LOW (ref 12.0–15.0)
Immature Granulocytes: 2 %
Lymphocytes Relative: 11 %
Lymphs Abs: 1.2 10*3/uL (ref 0.7–4.0)
MCH: 30.6 pg (ref 26.0–34.0)
MCHC: 31.4 g/dL (ref 30.0–36.0)
MCV: 97.5 fL (ref 80.0–100.0)
Monocytes Absolute: 0.6 10*3/uL (ref 0.1–1.0)
Monocytes Relative: 6 %
Neutro Abs: 8.8 10*3/uL — ABNORMAL HIGH (ref 1.7–7.7)
Neutrophils Relative %: 81 %
Platelets: 336 10*3/uL (ref 150–400)
RBC: 3.14 MIL/uL — ABNORMAL LOW (ref 3.87–5.11)
RDW: 15.9 % — ABNORMAL HIGH (ref 11.5–15.5)
WBC: 10.8 10*3/uL — ABNORMAL HIGH (ref 4.0–10.5)
nRBC: 0.4 % — ABNORMAL HIGH (ref 0.0–0.2)

## 2020-06-26 LAB — CULTURE, BLOOD (ROUTINE X 2)
Culture: NO GROWTH
Special Requests: ADEQUATE

## 2020-06-26 LAB — MAGNESIUM: Magnesium: 2.5 mg/dL — ABNORMAL HIGH (ref 1.7–2.4)

## 2020-06-26 LAB — PROCALCITONIN: Procalcitonin: <0.1 ng/mL

## 2020-06-26 LAB — PHOSPHORUS: Phosphorus: 4.4 mg/dL (ref 2.5–4.6)

## 2020-06-26 LAB — C-REACTIVE PROTEIN: CRP: 0.9 mg/dL (ref <1.0)

## 2020-06-26 MED ORDER — FREE WATER
200.0000 mL | Status: DC
  Administered 2020-06-26 – 2020-06-27 (×5): 200 mL

## 2020-06-26 MED ORDER — SODIUM CHLORIDE 0.9 % IV SOLN
INTRAVENOUS | Status: DC | PRN
  Administered 2020-06-26 – 2020-06-27 (×2): 250 mL via INTRAVENOUS

## 2020-06-26 MED ORDER — MIDODRINE HCL 5 MG PO TABS
5.0000 mg | ORAL_TABLET | Freq: Three times a day (TID) | ORAL | Status: DC
  Administered 2020-06-26 – 2020-06-27 (×4): 5 mg via ORAL
  Filled 2020-06-26 (×4): qty 1

## 2020-06-26 MED ORDER — METHYLPREDNISOLONE SODIUM SUCC 40 MG IJ SOLR
40.0000 mg | INTRAMUSCULAR | Status: AC
  Administered 2020-06-27 – 2020-07-01 (×5): 40 mg via INTRAVENOUS
  Filled 2020-06-26 (×5): qty 1

## 2020-06-26 MED ORDER — HYDROCORTISONE NA SUCCINATE PF 100 MG IJ SOLR
50.0000 mg | Freq: Four times a day (QID) | INTRAMUSCULAR | Status: DC
  Administered 2020-06-26 – 2020-06-27 (×4): 50 mg via INTRAVENOUS
  Filled 2020-06-26 (×4): qty 2

## 2020-06-26 NOTE — Progress Notes (Addendum)
NAME:  Ashley [name], MRN:  [id number], DOB:  [birth year], LOS: 5 ADMISSION DATE:  06/20/2020, CONSULTATION DATE:  06/23/2020 REFERRING MD:  Dr. Priscella Mann, CHIEF COMPLAINT:  Altered Mental Status, COVID-19 Pneumonia  Brief History:  55 y.o. Female admitted with Acute Metabolic Encephalopathy in the setting of COVID-19 Pneumonia & DKA/HHS.  History of Present Illness:  55 year old female extensive past medical history as per medical record who presents for lethargy, subjective fevers at home, decreased level of consciousness. Found to have multifocal pneumonia in the setting of COVID-19 infection. Also with elevated procalcitonin concerning for bacterial pulm infection. Also presented with DKA/HHS. She has been on intravenous insulin infusion via Endo tool protocol. Anion gap closed. Glycemic control improved. Will transition off of IV insulin.   06/24/20- patient on MV critically ill.  Labwork this am with hypernatremia, aki, transaminitis, hypokalemia. CXR with bilateral air space opacification worse at [facility].   Weaning levophed.  I spoke with husband Adelaide Robyn and reviewed care plan with him, he will be coming in this afternoon to see patient.  06/25/20- patient failed SBT today. WUA required ativan due to aggitation.  06/26/20- patient remain on MV with complete weaning off levophed support this am.  Will attempt SBT today, had altered mentation yesterday.  Optimizing metabolic derrangements to improve SBT.      Past Medical History:   Active Ambulatory Problems    Diagnosis Date Noted  . DIAB W/O MENTION COMP TYPE I [JUV TYPE] UNCNTRL 02/28/2009  . GERD 02/28/2009  . Gastroparesis 03/22/2009  . DYSPHAGIA UNSPECIFIED 02/28/2009  . DYSPHAGIA 03/22/2009  . ABDOMINAL PAIN-EPIGASTRIC 02/28/2009  . Nausea with vomiting 10/22/2012  . Diarrhea 10/22/2012  . Dyspnea and respiratory abnormality 04/26/2015  . Chest tightness 04/26/2015  . Hoarseness of voice 04/26/2015  . OSA and  COPD overlap syndrome ([place]) 04/02/2018  . Excessive daytime sleepiness 04/02/2018  . Delayed sleep phase syndrome 04/02/2018  . Moderate persistent asthma 04/02/2018  . Loud snoring 05/19/2018  . OSA on CPAP 10/08/2018  . Essential hypertension 12/25/2018  . Hyperlipidemia 12/25/2018  . Peripheral arterial disease ([place]) 12/25/2018  . Claudication in peripheral vascular disease ([place]) 01/01/2019  . Tobacco abuse 02/24/2020  . Hypertensive disorder 12/09/2018   Resolved Ambulatory Problems    Diagnosis Date Noted  . No Resolved Ambulatory Problems   Past Medical History:  Diagnosis Date  . Anxiety   . Asthma   . Depression   . Diabetes mellitus ([place])   . Esophageal stricture   . GERD (gastroesophageal reflux disease)   . Glaucoma   . Graves disease 02/2010  . Helicobacter pylori infection   . Hypertension   . Hypothyroidism   . Insulin pump in place   . Nephrolithiasis 2010  . Neuropathy   . Osteoporosis   . Sleep apnea   . Sleep apnea with use of continuous positive airway pressure (CPAP)   . Vitamin D deficiency      Significant Hospital Events:  1/10: Presented to ED; Hospitalist admit to [facility] 1/13: PCCM consult  Consults:  Hospitalist (primary service) Neurology PCCM Diabetes Coordinator  Procedures:  N/A  Significant Diagnostic Tests:  1/11: CT Head w/o Contrast>>Mild chronic ischemic white matter disease. No acute intracranial abnormality seen. 1/11: CTA Chest>>1. Extensive motion artifact limits assessment without signs of central or lobar level pulmonary embolism. 2. Diffuse areas of consolidative change, nodularity and ground-glass. Findings show midlung and basilar predominance. Findings of multifocal pneumonia in the setting of COVID-19 infection. 3. Mild mediastinal  and hilar nodal enlargement likely reactive. 4. Aortic atherosclerosis. 1/11: MR Brain w/o contrast>>No acute abnormality. Mild white matter changes similar to 2010. Mild  atrophy with progression since 2010.  Micro Data:  1/11: SARS-CoV-2 PCR>> Positive 1/11: Influenza A&B PCR>>negative  Antimicrobials:   Anti-infectives (From admission, onward)   Start     Dose/Rate Route Frequency Ordered Stop   06/23/20 1800  Ampicillin-Sulbactam (UNASYN) 3 g in sodium chloride 0.9 % 100 mL IVPB        3 g 200 mL/hr over 30 Minutes Intravenous Every 6 hours 06/23/20 1519     06/22/20 2200  ceFEPIme (MAXIPIME) 2 g in sodium chloride 0.9 % 100 mL IVPB  Status:  Discontinued        2 g 200 mL/hr over 30 Minutes Intravenous Every 8 hours 06/22/20 1138 06/23/20 1519   06/22/20 2200  vancomycin (VANCOREADY) IVPB 750 mg/150 mL  Status:  Discontinued        750 mg 150 mL/hr over 60 Minutes Intravenous Every 12 hours 06/22/20 1138 06/23/20 1519   06/22/20 1000  remdesivir 100 mg in sodium chloride 0.9 % 100 mL IVPB  Status:  Discontinued       "Followed by" Linked Group Details   100 mg 200 mL/hr over 30 Minutes Intravenous Daily 06/21/20 0126 06/21/20 0630   06/22/20 1000  vancomycin (VANCOCIN) IVPB 1000 mg/200 mL premix  Status:  Discontinued        1,000 mg 200 mL/hr over 60 Minutes Intravenous Every 24 hours 06/21/20 0816 06/22/20 1139   06/21/20 1200  ceFEPIme (MAXIPIME) 2 g in sodium chloride 0.9 % 100 mL IVPB  Status:  Discontinued        2 g 200 mL/hr over 30 Minutes Intravenous Every 12 hours 06/21/20 0816 06/22/20 1139   06/21/20 1000  remdesivir 100 mg in sodium chloride 0.9 % 100 mL IVPB  Status:  Discontinued       "Followed by" Linked Group Details   100 mg 200 mL/hr over 30 Minutes Intravenous Daily 06/20/20 2359 06/21/20 0002   06/21/20 1000  remdesivir 100 mg in sodium chloride 0.9 % 100 mL IVPB       "Followed by" Linked Group Details   100 mg 200 mL/hr over 30 Minutes Intravenous Daily 06/21/20 0002 06/24/20 0948   06/21/20 1000  cefTRIAXone (ROCEPHIN) 2 g in sodium chloride 0.9 % 100 mL IVPB  Status:  Discontinued        2 g 200 mL/hr over 30  Minutes Intravenous Every 24 hours 06/21/20 0639 06/21/20 0723   06/21/20 1000  azithromycin (ZITHROMAX) 500 mg in sodium chloride 0.9 % 250 mL IVPB  Status:  Discontinued        500 mg 250 mL/hr over 60 Minutes Intravenous Every 24 hours 06/21/20 0639 06/21/20 0723   06/21/20 0945  vancomycin (VANCOREADY) IVPB 500 mg/100 mL        500 mg 100 mL/hr over 60 Minutes Intravenous  Once 06/21/20 0932 06/21/20 1126   06/21/20 0900  vancomycin (VANCOREADY) IVPB 1500 mg/300 mL  Status:  Discontinued        1,500 mg 150 mL/hr over 120 Minutes Intravenous  Once 06/21/20 0816 06/21/20 0932   06/21/20 0645  vancomycin (VANCOCIN) IVPB 1000 mg/200 mL premix        1,000 mg 200 mL/hr over 60 Minutes Intravenous  Once 06/21/20 0632 06/21/20 0834   06/21/20 0645  piperacillin-tazobactam (ZOSYN) IVPB 3.375 g  3.375 g 100 mL/hr over 30 Minutes Intravenous  Once 06/21/20 7253 06/21/20 0716   06/21/20 0645  cefTRIAXone (ROCEPHIN) 2 g in sodium chloride 0.9 % 100 mL IVPB  Status:  Discontinued        2 g 200 mL/hr over 30 Minutes Intravenous Every 24 hours 06/21/20 0634 06/21/20 0634   06/21/20 0645  azithromycin (ZITHROMAX) 500 mg in sodium chloride 0.9 % 250 mL IVPB  Status:  Discontinued        500 mg 250 mL/hr over 60 Minutes Intravenous Every 24 hours 06/21/20 0634 06/21/20 0634   06/21/20 0130  remdesivir 200 mg in sodium chloride 0.9% 250 mL IVPB  Status:  Discontinued       "Followed by" Linked Group Details   200 mg 580 mL/hr over 30 Minutes Intravenous Once 06/21/20 0126 06/21/20 0630   06/21/20 0015  remdesivir 200 mg in sodium chloride 0.9% 250 mL IVPB       "Followed by" Linked Group Details   200 mg 580 mL/hr over 30 Minutes Intravenous Once 06/21/20 0002 06/21/20 0251   06/21/20 0000  remdesivir 200 mg in sodium chloride 0.9% 250 mL IVPB  Status:  Discontinued       "Followed by" Linked Group Details   200 mg 580 mL/hr over 30 Minutes Intravenous Once 06/20/20 2359 06/21/20 0002         Objective   Blood pressure (!) 115/48, pulse (!) 56, temperature 97.7 F (36.5 C), resp. rate 16, height 4\' 11"  (1.499 m), weight 63 kg, SpO2 98 %.    Vent Mode: PRVC FiO2 (%):  [35 %-40 %] 35 % Set Rate:  [16 bmp] 16 bmp Vt Set:  [48 mL-480 mL] 480 mL PEEP:  [5 cmH20] 5 cmH20 Plateau Pressure:  [19 cmH20-20 cmH20] 19 cmH20   Intake/Output Summary (Last 24 hours) at 06/26/2020 0933 Last data filed at 06/26/2020 0813 Gross per 24 hour  Intake 1947.26 ml  Output 1400 ml  Net 547.26 ml   Filed Weights   06/22/20 0622 06/25/20 0500 06/26/20 0500  Weight: 65.5 kg 63.4 kg 63 kg    PHYSICAL EXAMINATION:  GENERAL:critically ill appearing, +resp distress HEAD: Normocephalic, atraumatic.  EYES: Pupils equal, round, reactive to light.  No scleral icterus.  MOUTH: Moist mucosal membrane. NECK: Supple. No thyromegaly. No nodules. No JVD.  PULMONARY: +rhonchi CARDIOVASCULAR: S1 and S2. Regular rate and rhythm. No murmurs, rubs, or gallops.  GASTROINTESTINAL: Soft, nontender, -distended. Positive bowel sounds.  MUSCULOSKELETAL: No swelling, clubbing, or edema.  NEUROLOGIC: awake, agitated and confused SKIN:intact,warm,dry  Labs   CBC: Recent Labs  Lab 06/22/20 0359 06/23/20 0222 06/24/20 0334 06/25/20 0614 06/26/20 0524  WBC 9.0 7.2 17.9* 14.6* 10.8*  NEUTROABS 7.3 5.8 15.1* 12.1* 8.8*  HGB 12.8 12.6 11.2* 9.7* 9.6*  HCT 37.2 37.7 33.7* 30.1* 30.6*  MCV 91.0 91.5 93.1 95.3 97.5  PLT 151 196 321 339 336    Basic Metabolic Panel: Recent Labs  Lab 06/22/20 0359 06/23/20 0222 06/24/20 0334 06/24/20 2029 06/25/20 0614 06/26/20 0524  NA 145 152* 159* 153* 152* 151*  K 3.5 3.4* 3.0* 3.8 4.0 4.2  CL 109 113* 118* 112* 113* 111  CO2 26 27 30 28 27 28   GLUCOSE 260* 211* 78 280* 195* 245*  BUN 21* 30* 46* 41* 36* 36*  CREATININE 0.93 0.95 1.25* 1.12* 0.93 0.84  CALCIUM 8.3* 8.7* 8.4* 7.9* 8.1* 8.4*  MG 2.2 2.6* 2.6*  --  2.4 2.5*  PHOS  --   --  1.7*  --  3.7 4.4    GFR: Estimated Creatinine Clearance: 61.8 mL/min (by C-G formula based on SCr of 0.84 mg/dL). Recent Labs  Lab 06/20/20 1437 06/21/20 0046 06/21/20 0755 06/21/20 1114 06/22/20 0359 06/22/20 [id number] 06/23/20 0222 06/24/20 0334 06/25/20 0614 06/26/20 0524  PROCALCITON  --  1.43  --   --   --   --   --  0.70 0.13 <0.10  WBC 4.3  --  4.8  --    < >  --  7.2 17.9* 14.6* 10.8*  LATICACIDVEN 1.1  --  2.2* 2.6*  --  2.3*  --   --   --   --    < > = values in this interval not displayed.    Liver Function Tests: Recent Labs  Lab 06/22/20 0359 06/23/20 0222 06/24/20 0334 06/25/20 0614 06/26/20 0524  AST 86* 57* 42* 24 24  ALT 41 35 26 21 18   ALKPHOS 60 65 76 74 76  BILITOT 0.9 1.1 0.7 0.5 0.7  PROT 6.6 6.8 5.9* 5.3* 5.5*  ALBUMIN 3.3* 3.3* 2.9* 2.5* 2.3*   No results for input(s): LIPASE, AMYLASE in the last 168 hours. Recent Labs  Lab 06/23/20 0832  AMMONIA 24    ABG    Component Value Date/Time   PHART 7.45 06/23/2020 2100   PCO2ART 45 06/23/2020 2100   PO2ART 243 (H) 06/23/2020 2100   HCO3 31.3 (H) 06/23/2020 2100   TCO2 32 05/05/2020 0200   ACIDBASEDEF 6.4 (H) 06/21/2020 0120   O2SAT 99.9 06/23/2020 2100     Coagulation Profile: No results for input(s): INR, PROTIME in the last 168 hours.  Cardiac Enzymes: No results for input(s): CKTOTAL, CKMB, CKMBINDEX, TROPONINI in the last 168 hours.  HbA1C: Hgb A1c MFr Bld  Date/Time Value Ref Range Status  06/21/2020 03:23 AM 8.2 (H) 4.8 - 5.6 % Final    Comment:    (NOTE) Pre diabetes:          5.7%-6.4%  Diabetes:              >6.4%  Glycemic control for   <7.0% adults with diabetes     CBG: Recent Labs  Lab 06/25/20 1632 06/25/20 2022 06/25/20 2341 06/26/20 0500 06/26/20 0738  GLUCAP 204* 190* 203* 220* 217*    Review of Systems:   REVIEW OF SYSTEMS  PATIENT IS UNABLE TO PROVIDE COMPLETE REVIEW OF SYSTEM S DUE TO SEVERE CRITICAL ILLNESS AND ENCEPHALOPATHY   Past Medical History:  She,   has a past medical history of Anxiety, Asthma, Depression, Diabetes mellitus ([place]), Esophageal stricture, Gastroparesis, GERD (gastroesophageal reflux disease), Glaucoma, Graves disease ([id number]), Helicobacter pylori infection, Hyperlipidemia, Hypertension, Hypothyroidism, Insulin pump in place, Nephrolithiasis (2010), Neuropathy, Osteoporosis, Sleep apnea, Sleep apnea with use of continuous positive airway pressure (CPAP), and Vitamin D deficiency.   Surgical History:   Past Surgical History:  Procedure Laterality Date  . ABDOMINAL AORTOGRAM W/LOWER EXTREMITY Bilateral 01/01/2019   Procedure: ABDOMINAL AORTOGRAM W/LOWER EXTREMITY;  Surgeon: Lorretta Harp, MD;  Location: [facility];  Service: Cardiovascular;  Laterality: Bilateral;  . BREAST EXCISIONAL BIOPSY    . BREAST LUMPECTOMY Left    LEFT-BENIGN  . NASAL SEPTOPLASTY W/ TURBINOPLASTY Bilateral 09/07/2013   Procedure: SEPTOPLASTY, BILATERAL TURBINATE RESECTION ;  Surgeon: Ascencion Dike, MD;  Location: [facility];  Service: ENT;  Laterality: Bilateral;  . PERIPHERAL VASCULAR INTERVENTION Bilateral 01/01/2019   Procedure: PERIPHERAL VASCULAR INTERVENTION;  Surgeon: Lorretta Harp,  MD;  Location: [facility];  Service: Cardiovascular;  Laterality: Bilateral;  . UPPER GASTROINTESTINAL ENDOSCOPY    . VAGINAL HYSTERECTOMY  2006     Social History:   reports that she has been smoking cigarettes. She has a 12.50 pack-year smoking history. She has never used smokeless tobacco. She reports that she does not drink alcohol and does not use drugs.   Family History:  Her family history includes Alcoholism in her father; Diabetes in her father; Heart attack in her father; Irritable bowel syndrome in her sister; Liver cancer in her mother; Lung cancer in her mother. There is no history of Colon cancer, Esophageal cancer, Rectal cancer, or Stomach cancer.   Allergies Allergies  Allergen Reactions  . Mirtazapine Rash     And hives  . Clindamycin Hives  . Other Hives    Tylox   . Sulfonamide Derivatives Hives  . Lipitor [Atorvastatin] Other (See Comments)    MUSCLE ACHES  . Restasis [Cyclosporine]     Pt stated, "My eyes turned red on the inside and outside of eye; burning sensation"  . Topamax [Topiramate] Itching     Home Medications  Prior to Admission medications   Medication Sig Start Date End Date Taking? Authorizing Provider  albuterol (PROVENTIL) (2.5 MG/3ML) 0.083% nebulizer solution Take 2.5 mg by nebulization every 4 (four) hours as needed for wheezing or shortness of breath.  03/18/15  Yes [provider]  ALPRAZolam Duanne Moron) 0.5 MG tablet Take 0.5 mg by mouth 3 (three) times daily as needed for anxiety.  10/08/12  Yes [provider]  amLODipine (NORVASC) 5 MG tablet Take 1 tablet (5 mg total) by mouth daily. Patient taking differently: Take 5 mg by mouth in the morning and at bedtime. 01/21/19  Yes Lorretta Harp, MD  busPIRone (BUSPAR) 15 MG tablet Take 15 mg by mouth 2 (two) times daily. 05/25/20  Yes [provider]  clopidogrel (PLAVIX) 75 MG tablet TAKE 1 TABLET (75 MG TOTAL) BY MOUTH DAILY WITH BREAKFAST. 12/24/19  Yes Lorretta Harp, MD  DULoxetine (CYMBALTA) 30 MG capsule Take 30 mg by mouth every morning. Takes 90 mg total 01/22/18  Yes [provider]  DULoxetine (CYMBALTA) 60 MG capsule Take 60 mg by mouth every morning. Takes 90 mg total   Yes [provider]  estradiol (ESTRACE) 0.5 MG tablet Take 0.5 mg by mouth daily. 04/07/20  Yes [provider]  famotidine (PEPCID) 40 MG tablet Take 1 tablet (40 mg total) by mouth at bedtime. 02/24/20  Yes Irene Shipper, MD  folic acid (FOLVITE) 1 MG tablet Take 1 tablet by mouth daily. 06/23/19  Yes [provider]  hydrochlorothiazide (MICROZIDE) 12.5 MG capsule TAKE 1 CAPSULE EVERY DAY Patient taking differently: Take 12.5 mg by mouth daily. 02/25/20  Yes Lorretta Harp, MD   ibandronate (BONIVA) 150 MG tablet Take 150 mg by mouth every 30 (thirty) days. 03/23/20  Yes [provider]  Insulin Human (INSULIN PUMP) SOLN Inject into the skin.   Yes [provider]  lansoprazole (PREVACID) 30 MG capsule Take 1 capsule (30 mg total) by mouth in the morning and at bedtime. 12/04/19  Yes Irene Shipper, MD  latanoprost (XALATAN) 0.005 % ophthalmic solution Place 1 drop into both eyes at bedtime. 12/26/17  Yes [provider]  levocetirizine (XYZAL) 5 MG tablet Take 5 mg by mouth every morning.    Yes [provider]  levothyroxine (SYNTHROID, [place]) [place]  tablet Take 112 mcg by mouth daily before breakfast.  01/07/18  Yes [provider]  losartan (COZAAR) 100 MG tablet Take 100 mg by mouth at bedtime.    Yes [provider]  methotrexate (RHEUMATREX) 2.5 MG tablet 5 tablets by moth twice a day once a week 06/23/19  Yes [provider]  metoprolol (TOPROL-XL) 200 MG 24 hr tablet Take 200 mg by mouth daily. One tablet by mouth once daily  09/26/12  Yes [provider]  montelukast (SINGULAIR) 10 MG tablet Take 10 mg by mouth at bedtime.   Yes [provider]  NOVOLOG 100 UNIT/ML injection Inject into the skin See admin instructions. Pt has Insulin pump 08/06/18  Yes [provider]  pregabalin (LYRICA) 75 MG capsule Take 150 mg by mouth at bedtime.  07/24/18  Yes [provider]  promethazine (PHENERGAN) 25 MG tablet Take 25 mg by mouth every 4 (four) hours as needed for nausea or vomiting. Every 4-6 hours as needed 08/30/14  Yes [provider]  rosuvastatin (CRESTOR) 20 MG tablet Take 20 mg by mouth at bedtime.   Yes [provider]  Vitamin D, Ergocalciferol, (DRISDOL) 50000 UNITS CAPS Take 50,000 Units by mouth every 7 (seven) days.   Yes [provider]  ACCU-CHEK COMPACT PLUS test strip 1 each by Other route as needed.  10/20/12   [provider]  ACCU-CHEK SOFTCLIX LANCETS lancets  05/19/18   [provider]  AMBULATORY NON [place] INSULIN PUMP USES AS DIRECTED    [provider]      Assessment & Plan:  55 yo obese white female with acute and severe resp failure with acute and severe ARDS with b/l pneumonia due to COVID pneumonia with acute COPD exacerbation  Severe COVID-19 infection, ARDS and pneumonia/pneumonitis -Remdesevir antiviral - pharmacy protocol 5 d -vitamin C -zinc -decadron 6mg  IV daily  -Diuresis - Lasix 40 IV daily - monitor UOP - utilize external urinary catheter if possible - prone if patient can tolerate  -d/c hepatotoxic medications while on remdesevir -supportive care with ICU telemetry monitoring -PT/OT when possible -procalcitonin, CRP and ferritin trending   Hypernatremia, hypophasphatemia, hypokalemia -  D5LR with 33meq additive at 75cc/hr  - kphos  - phamD consult -electorlytes    ENCEPHALOPATHY          Metabolic with COVID induced        -s/p brain neuro imaging-MRI brain >> -No acute abnormality. Mild white matter changes similar to 2010. Mild atrophy with progression since 2010.   PROBABLE ACUTE DIASTOLIC CARDIAC FAILURE-  -oxygen as needed -Lasix as tolerated  ELECTROLYTES -follow labs as needed -replace as needed -pharmacy consultation and following   DVT/GI PRX ordered and assessed TRANSFUSIONS AS NEEDED MONITOR FSBS I Assessed the need for Labs I Assessed the need for Foley I Assessed the need for Central Venous Line Family Discussion when available I Assessed the need for Mobilization I made an Assessment of medications to be adjusted accordingly Safety Risk assessment completed  CASE DISCUSSED IN MULTIDISCIPLINARY ROUNDS WITH ICU TEAM    Critical Care Time devoted to patient care services described in this note is 32  minutes.   Overall, patient is critically ill, prognosis is guarded.  Patient with Multiorgan failure  and at high risk for cardiac arrest and death.     Ottie Glazier, M.D.  [facility] [place]

## 2020-06-26 NOTE — Progress Notes (Signed)
PHARMACY CONSULT NOTE - FOLLOW UP  Pharmacy Consult for Electrolyte Monitoring and Replacement   Recent Labs: Potassium (mmol/L)  Date Value  06/26/2020 4.2   Magnesium (mg/dL)  Date Value  06/26/2020 2.5 (H)   Calcium (mg/dL)  Date Value  06/26/2020 8.4 (L)   Albumin (g/dL)  Date Value  06/26/2020 2.3 (L)  01/09/2019 4.5   Phosphorus (mg/dL)  Date Value  06/26/2020 4.4   Sodium (mmol/L)  Date Value  06/26/2020 151 (H)  01/30/2019 142     Assessment: 55 year old female admitted with metabolic encephalopathy in setting of Covid and DKA/HHS. Patient required intubation 1/13. She has been on and off of the insulin drip. Currently remains off drip on subcu regimen. Pharmacy to manage electrolytes.   Goal of Therapy:  Electrolytes WNL  Plan:  Na 151 K 4.2  Mag 2.5  Phos 4.4  Scr 0.84   -no additional replacement at this time -free water 200 ml per tube q6h F/u with am labs  Chinita Greenland PharmD Clinical Pharmacist 06/26/2020

## 2020-06-27 ENCOUNTER — Inpatient Hospital Stay: Payer: Medicare HMO

## 2020-06-27 DIAGNOSIS — J9601 Acute respiratory failure with hypoxia: Secondary | ICD-10-CM

## 2020-06-27 DIAGNOSIS — G9349 Other encephalopathy: Secondary | ICD-10-CM

## 2020-06-27 DIAGNOSIS — J1282 Pneumonia due to coronavirus disease 2019: Secondary | ICD-10-CM

## 2020-06-27 DIAGNOSIS — U071 COVID-19: Secondary | ICD-10-CM | POA: Diagnosis not present

## 2020-06-27 LAB — BASIC METABOLIC PANEL
Anion gap: 8 (ref 5–15)
Anion gap: 16 — ABNORMAL HIGH (ref 5–15)
BUN: 43 mg/dL — ABNORMAL HIGH (ref 6–20)
BUN: 44 mg/dL — ABNORMAL HIGH (ref 6–20)
CO2: 31 mmol/L (ref 22–32)
CO2: 28 mmol/L (ref 22–32)
Calcium: 8.2 mg/dL — ABNORMAL LOW (ref 8.9–10.3)
Calcium: 8.6 mg/dL — ABNORMAL LOW (ref 8.9–10.3)
Chloride: 113 mmol/L — ABNORMAL HIGH (ref 98–111)
Chloride: 104 mmol/L (ref 98–111)
Creatinine, Ser: 0.92 mg/dL (ref 0.44–1.00)
Creatinine, Ser: 0.93 mg/dL (ref 0.44–1.00)
GFR, Estimated: >60 mL/min (ref >60)
GFR, Estimated: >60 mL/min (ref >60)
Glucose, Bld: 261 mg/dL — ABNORMAL HIGH (ref 70–99)
Glucose, Bld: 232 mg/dL — ABNORMAL HIGH (ref 70–99)
Potassium: 4.3 mmol/L (ref 3.5–5.1)
Potassium: 3.5 mmol/L (ref 3.5–5.1)
Sodium: 152 mmol/L — ABNORMAL HIGH (ref 135–145)
Sodium: 148 mmol/L — ABNORMAL HIGH (ref 135–145)

## 2020-06-27 LAB — TRIGLYCERIDES: Triglycerides: 204 mg/dL — ABNORMAL HIGH (ref <150)

## 2020-06-27 LAB — GLUCOSE, CAPILLARY
Glucose-Capillary: 137 mg/dL — ABNORMAL HIGH (ref 70–99)
Glucose-Capillary: 209 mg/dL — ABNORMAL HIGH (ref 70–99)
Glucose-Capillary: 223 mg/dL — ABNORMAL HIGH (ref 70–99)
Glucose-Capillary: 240 mg/dL — ABNORMAL HIGH (ref 70–99)
Glucose-Capillary: 241 mg/dL — ABNORMAL HIGH (ref 70–99)
Glucose-Capillary: 242 mg/dL — ABNORMAL HIGH (ref 70–99)
Glucose-Capillary: 273 mg/dL — ABNORMAL HIGH (ref 70–99)

## 2020-06-27 LAB — CBC
HCT: 29 % — ABNORMAL LOW (ref 36.0–46.0)
Hemoglobin: 9.3 g/dL — ABNORMAL LOW (ref 12.0–15.0)
MCH: 31.3 pg (ref 26.0–34.0)
MCHC: 32.1 g/dL (ref 30.0–36.0)
MCV: 97.6 fL (ref 80.0–100.0)
Platelets: 354 10*3/uL (ref 150–400)
RBC: 2.97 MIL/uL — ABNORMAL LOW (ref 3.87–5.11)
RDW: 15.9 % — ABNORMAL HIGH (ref 11.5–15.5)
WBC: 10.5 10*3/uL (ref 4.0–10.5)
nRBC: 1 % — ABNORMAL HIGH (ref 0.0–0.2)

## 2020-06-27 LAB — FERRITIN: Ferritin: 464 ng/mL — ABNORMAL HIGH (ref 11–307)

## 2020-06-27 LAB — C-REACTIVE PROTEIN: CRP: 0.7 mg/dL (ref <1.0)

## 2020-06-27 LAB — MAGNESIUM: Magnesium: 2.7 mg/dL — ABNORMAL HIGH (ref 1.7–2.4)

## 2020-06-27 LAB — PHOSPHORUS: Phosphorus: 4.5 mg/dL (ref 2.5–4.6)

## 2020-06-27 MED ORDER — HYDROCORTISONE NA SUCCINATE PF 100 MG IJ SOLR: 50.0000 mg | Freq: Two times a day (BID) | INTRAMUSCULAR | Status: DC

## 2020-06-27 MED ORDER — METOLAZONE 5 MG PO TABS
5.0000 mg | ORAL_TABLET | Freq: Once | ORAL | Status: AC
  Administered 2020-06-27: 5 mg via ORAL
  Filled 2020-06-27: qty 1

## 2020-06-27 MED ORDER — MIDAZOLAM HCL 2 MG/2ML IJ SOLN: 2.0000 mg | Freq: Once | INTRAMUSCULAR | Status: AC

## 2020-06-27 MED ORDER — PROPOFOL 1000 MG/100ML IV EMUL
INTRAVENOUS | Status: AC
  Administered 2020-06-27: 10 ug/kg/min via INTRAVENOUS
  Filled 2020-06-27: qty 100

## 2020-06-27 MED ORDER — INSULIN ASPART 100 UNIT/ML ~~LOC~~ SOLN
3.0000 [IU] | SUBCUTANEOUS | Status: DC
  Administered 2020-06-27 – 2020-06-30 (×20): 3 [IU] via SUBCUTANEOUS
  Filled 2020-06-27 (×17): qty 1

## 2020-06-27 MED ORDER — QUETIAPINE FUMARATE 25 MG PO TABS
25.0000 mg | ORAL_TABLET | Freq: Every day | ORAL | Status: DC
  Administered 2020-06-27: 25 mg via ORAL
  Filled 2020-06-27: qty 1

## 2020-06-27 MED ORDER — FUROSEMIDE 10 MG/ML IJ SOLN
20.0000 mg | Freq: Once | INTRAMUSCULAR | Status: AC
  Administered 2020-06-27: 20 mg via INTRAVENOUS
  Filled 2020-06-27: qty 2

## 2020-06-27 MED ORDER — MIDAZOLAM HCL 2 MG/2ML IJ SOLN
INTRAMUSCULAR | Status: AC
  Administered 2020-06-27: 2 mg via INTRAVENOUS
  Filled 2020-06-27: qty 2

## 2020-06-27 MED ORDER — PROPOFOL 1000 MG/100ML IV EMUL
5.0000 ug/kg/min | INTRAVENOUS | Status: DC
  Administered 2020-06-28 (×2): 25 ug/kg/min via INTRAVENOUS
  Administered 2020-06-29: 10 ug/kg/min via INTRAVENOUS
  Administered 2020-06-29 (×2): 40 ug/kg/min via INTRAVENOUS
  Administered 2020-06-30: 15 ug/kg/min via INTRAVENOUS
  Filled 2020-06-27 (×6): qty 100

## 2020-06-27 MED ORDER — FREE WATER
200.0000 mL | Status: DC
  Administered 2020-06-27 – 2020-06-28 (×14): 200 mL

## 2020-06-27 MED ORDER — QUETIAPINE FUMARATE 25 MG PO TABS
50.0000 mg | ORAL_TABLET | Freq: Every day | ORAL | Status: DC
  Administered 2020-06-27: 50 mg via ORAL
  Filled 2020-06-27: qty 2

## 2020-06-27 NOTE — Progress Notes (Signed)
NAME:  Ashley [name], MRN:  [id number], DOB:  [birth year], LOS: 6 ADMISSION DATE:  06/20/2020, CONSULTATION DATE:  06/23/2020 REFERRING MD:  Dr. Priscella Mann, CHIEF COMPLAINT:  Altered Mental Status, COVID-19 Pneumonia  Brief History:  55 y.o. Female admitted with Acute Metabolic Encephalopathy in the setting of COVID-19 Pneumonia & DKA/HHS.  History of Present Illness:  55 year old female extensive past medical history as per medical record who presents for lethargy, subjective fevers at home, decreased level of consciousness. Found to have multifocal pneumonia in the setting of COVID-19 infection. Also with elevated procalcitonin concerning for bacterial pulm infection. Also presented with DKA/HHS. She has been on intravenous insulin infusion via Endo tool protocol. Anion gap closed. Glycemic control improved. Will transition off of IV insulin.   06/24/20- patient on MV critically ill.  Labwork this am with hypernatremia, aki, transaminitis, hypokalemia. CXR with bilateral air space opacification worse at [facility].   Weaning levophed.  I spoke with husband Ashley [name] and reviewed care plan with him, he will be coming in this afternoon to see patient.  06/25/20- patient failed SBT today. WUA required ativan due to aggitation.  06/26/20- patient remain on MV with complete weaning off levophed support this am.  Will attempt SBT today, had altered mentation yesterday.  Optimizing metabolic derrangements to improve SBT.  06/27/20 - Start precedex and wean off versed due to agitation/delirium    Past Medical History:   Active Ambulatory Problems    Diagnosis Date Noted  . DIAB W/O MENTION COMP TYPE I [JUV TYPE] UNCNTRL 02/28/2009  . GERD 02/28/2009  . Gastroparesis 03/22/2009  . DYSPHAGIA UNSPECIFIED 02/28/2009  . DYSPHAGIA 03/22/2009  . ABDOMINAL PAIN-EPIGASTRIC 02/28/2009  . Nausea with vomiting 10/22/2012  . Diarrhea 10/22/2012  . Dyspnea and respiratory abnormality 04/26/2015  . Chest  tightness 04/26/2015  . Hoarseness of voice 04/26/2015  . OSA and COPD overlap syndrome ([place]) 04/02/2018  . Excessive daytime sleepiness 04/02/2018  . Delayed sleep phase syndrome 04/02/2018  . Moderate persistent asthma 04/02/2018  . Loud snoring 05/19/2018  . OSA on CPAP 10/08/2018  . Essential hypertension 12/25/2018  . Hyperlipidemia 12/25/2018  . Peripheral arterial disease ([place]) 12/25/2018  . Claudication in peripheral vascular disease ([place]) 01/01/2019  . Tobacco abuse 02/24/2020  . Hypertensive disorder 12/09/2018   Resolved Ambulatory Problems    Diagnosis Date Noted  . No Resolved Ambulatory Problems   Past Medical History:  Diagnosis Date  . Anxiety   . Asthma   . Depression   . Diabetes mellitus ([place])   . Esophageal stricture   . GERD (gastroesophageal reflux disease)   . Glaucoma   . Graves disease 02/2010  . Helicobacter pylori infection   . Hypertension   . Hypothyroidism   . Insulin pump in place   . Nephrolithiasis 2010  . Neuropathy   . Osteoporosis   . Sleep apnea   . Sleep apnea with use of continuous positive airway pressure (CPAP)   . Vitamin D deficiency      Significant Hospital Events:  1/10: Presented to ED; Hospitalist admit to [facility] 1/13: PCCM consult  Consults:  Hospitalist (primary service) Neurology PCCM Diabetes Coordinator  Procedures:  N/A  Significant Diagnostic Tests:  1/11: CT Head w/o Contrast>>Mild chronic ischemic white matter disease. No acute intracranial abnormality seen. 1/11: CTA Chest>>1. Extensive motion artifact limits assessment without signs of central or lobar level pulmonary embolism. 2. Diffuse areas of consolidative change, nodularity and ground-glass. Findings show midlung and basilar predominance. Findings of multifocal  pneumonia in the setting of COVID-19 infection. 3. Mild mediastinal and hilar nodal enlargement likely reactive. 4. Aortic atherosclerosis. 1/11: MR Brain w/o contrast>>No acute  abnormality. Mild white matter changes similar to 2010. Mild atrophy with progression since 2010.  Micro Data:  1/11: SARS-CoV-2 PCR>> Positive 1/11: Influenza A&B PCR>>negative  Antimicrobials:   Anti-infectives (From admission, onward)   Start     Dose/Rate Route Frequency Ordered Stop   06/23/20 1800  Ampicillin-Sulbactam (UNASYN) 3 g in sodium chloride 0.9 % 100 mL IVPB        3 g 200 mL/hr over 30 Minutes Intravenous Every 6 hours 06/23/20 1519     06/22/20 2200  ceFEPIme (MAXIPIME) 2 g in sodium chloride 0.9 % 100 mL IVPB  Status:  Discontinued        2 g 200 mL/hr over 30 Minutes Intravenous Every 8 hours 06/22/20 1138 06/23/20 1519   06/22/20 2200  vancomycin (VANCOREADY) IVPB 750 mg/150 mL  Status:  Discontinued        750 mg 150 mL/hr over 60 Minutes Intravenous Every 12 hours 06/22/20 1138 06/23/20 1519   06/22/20 1000  remdesivir 100 mg in sodium chloride 0.9 % 100 mL IVPB  Status:  Discontinued       "Followed by" Linked Group Details   100 mg 200 mL/hr over 30 Minutes Intravenous Daily 06/21/20 0126 06/21/20 0630   06/22/20 1000  vancomycin (VANCOCIN) IVPB 1000 mg/200 mL premix  Status:  Discontinued        1,000 mg 200 mL/hr over 60 Minutes Intravenous Every 24 hours 06/21/20 0816 06/22/20 1139   06/21/20 1200  ceFEPIme (MAXIPIME) 2 g in sodium chloride 0.9 % 100 mL IVPB  Status:  Discontinued        2 g 200 mL/hr over 30 Minutes Intravenous Every 12 hours 06/21/20 0816 06/22/20 1139   06/21/20 1000  remdesivir 100 mg in sodium chloride 0.9 % 100 mL IVPB  Status:  Discontinued       "Followed by" Linked Group Details   100 mg 200 mL/hr over 30 Minutes Intravenous Daily 06/20/20 2359 06/21/20 0002   06/21/20 1000  remdesivir 100 mg in sodium chloride 0.9 % 100 mL IVPB       "Followed by" Linked Group Details   100 mg 200 mL/hr over 30 Minutes Intravenous Daily 06/21/20 0002 06/24/20 0948   06/21/20 1000  cefTRIAXone (ROCEPHIN) 2 g in sodium chloride 0.9 % 100 mL  IVPB  Status:  Discontinued        2 g 200 mL/hr over 30 Minutes Intravenous Every 24 hours 06/21/20 0639 06/21/20 0723   06/21/20 1000  azithromycin (ZITHROMAX) 500 mg in sodium chloride 0.9 % 250 mL IVPB  Status:  Discontinued        500 mg 250 mL/hr over 60 Minutes Intravenous Every 24 hours 06/21/20 0639 06/21/20 0723   06/21/20 0945  vancomycin (VANCOREADY) IVPB 500 mg/100 mL        500 mg 100 mL/hr over 60 Minutes Intravenous  Once 06/21/20 0932 06/21/20 1126   06/21/20 0900  vancomycin (VANCOREADY) IVPB 1500 mg/300 mL  Status:  Discontinued        1,500 mg 150 mL/hr over 120 Minutes Intravenous  Once 06/21/20 0816 06/21/20 0932   06/21/20 0645  vancomycin (VANCOCIN) IVPB 1000 mg/200 mL premix        1,000 mg 200 mL/hr over 60 Minutes Intravenous  Once 06/21/20 0632 06/21/20 0834   06/21/20 0645  piperacillin-tazobactam (  ZOSYN) IVPB 3.375 g        3.375 g 100 mL/hr over 30 Minutes Intravenous  Once 06/21/20 0632 06/21/20 0716   06/21/20 0645  cefTRIAXone (ROCEPHIN) 2 g in sodium chloride 0.9 % 100 mL IVPB  Status:  Discontinued        2 g 200 mL/hr over 30 Minutes Intravenous Every 24 hours 06/21/20 0634 06/21/20 0634   06/21/20 0645  azithromycin (ZITHROMAX) 500 mg in sodium chloride 0.9 % 250 mL IVPB  Status:  Discontinued        500 mg 250 mL/hr over 60 Minutes Intravenous Every 24 hours 06/21/20 0634 06/21/20 0634   06/21/20 0130  remdesivir 200 mg in sodium chloride 0.9% 250 mL IVPB  Status:  Discontinued       "Followed by" Linked Group Details   200 mg 580 mL/hr over 30 Minutes Intravenous Once 06/21/20 0126 06/21/20 0630   06/21/20 0015  remdesivir 200 mg in sodium chloride 0.9% 250 mL IVPB       "Followed by" Linked Group Details   200 mg 580 mL/hr over 30 Minutes Intravenous Once 06/21/20 0002 06/21/20 0251   06/21/20 0000  remdesivir 200 mg in sodium chloride 0.9% 250 mL IVPB  Status:  Discontinued       "Followed by" Linked Group Details   200 mg 580 mL/hr over 30  Minutes Intravenous Once 06/20/20 2359 06/21/20 0002        Objective   Blood pressure (!) 121/51, pulse 73, temperature 98.78 F (37.1 C), resp. rate 16, height 4\' 11"  (1.499 m), weight 63 kg, SpO2 94 %.    Vent Mode: PRVC FiO2 (%):  [35 %] 35 % Set Rate:  [16 bmp] 16 bmp Vt Set:  [480 mL] 480 mL PEEP:  [5 cmH20] 5 cmH20 Plateau Pressure:  [17 cmH20-18 cmH20] 17 cmH20   Intake/Output Summary (Last 24 hours) at 06/27/2020 0757 Last data filed at 06/27/2020 0700 Gross per 24 hour  Intake 2079.33 ml  Output 1150 ml  Net 929.33 ml   Filed Weights   06/22/20 0622 06/25/20 0500 06/26/20 0500  Weight: 65.5 kg 63.4 kg 63 kg    PHYSICAL EXAMINATION:  GENERAL: intubated and sedated.  HEAD: Normocephalic, atraumatic.  EYES: Pupils equal, round, reactive to light.  No scleral icterus.  MOUTH: Moist mucosal membrane. Sclera anicteric. NECK: Supple. No JVD. PULMONARY: diminished breath sounds throughout, course though. No wheezing. CARDIOVASCULAR: S1S2. Regular rate and rhythm. No murmurs, rubs, or gallops.  GASTROINTESTINAL: Soft, nontender, -distended. Positive bowel sounds.  MUSCULOSKELETAL: No swelling, clubbing, or edema.  NEUROLOGIC: awakes to verbal stimuli. No purposeful movements.  SKIN: intact, no rashes  Labs   CBC: Recent Labs  Lab 06/22/20 0359 06/23/20 0222 06/24/20 0334 06/25/20 0614 06/26/20 0524 06/27/20 0312  WBC 9.0 7.2 17.9* 14.6* 10.8* 10.5  NEUTROABS 7.3 5.8 15.1* 12.1* 8.8*  --   HGB 12.8 12.6 11.2* 9.7* 9.6* 9.3*  HCT 37.2 37.7 33.7* 30.1* 30.6* 29.0*  MCV 91.0 91.5 93.1 95.3 97.5 97.6  PLT 151 196 321 339 336 784    Basic Metabolic Panel: Recent Labs  Lab 06/23/20 0222 06/24/20 0334 06/24/20 2029 06/25/20 0614 06/26/20 0524 06/27/20 0312  NA 152* 159* 153* 152* 151* 152*  K 3.4* 3.0* 3.8 4.0 4.2 4.3  CL 113* 118* 112* 113* 111 113*  CO2 27 30 28 27 28 31   GLUCOSE 211* 78 280* 195* 245* 261*  BUN 30* 46* 41* 36* 36* 43*  CREATININE  0.95 1.25* 1.12* 0.93 0.84 0.92  CALCIUM 8.7* 8.4* 7.9* 8.1* 8.4* 8.2*  MG 2.6* 2.6*  --  2.4 2.5* 2.7*  PHOS  --  1.7*  --  3.7 4.4 4.5   GFR: Estimated Creatinine Clearance: 56.4 mL/min (by C-G formula based on SCr of 0.92 mg/dL). Recent Labs  Lab 06/20/20 1437 06/21/20 0046 06/21/20 0755 06/21/20 1114 06/22/20 0359 06/22/20 0932 06/23/20 0222 06/24/20 0334 06/25/20 0614 06/26/20 0524 06/27/20 0312  PROCALCITON  --  1.43  --   --   --   --   --  0.70 0.13 <0.10  --   WBC 4.3  --  4.8  --    < >  --    < > 17.9* 14.6* 10.8* 10.5  LATICACIDVEN 1.1  --  2.2* 2.6*  --  2.3*  --   --   --   --   --    < > = values in this interval not displayed.    Liver Function Tests: Recent Labs  Lab 06/22/20 0359 06/23/20 0222 06/24/20 0334 06/25/20 0614 06/26/20 0524  AST 86* 57* 42* 24 24  ALT 41 35 26 21 18   ALKPHOS 60 65 76 74 76  BILITOT 0.9 1.1 0.7 0.5 0.7  PROT 6.6 6.8 5.9* 5.3* 5.5*  ALBUMIN 3.3* 3.3* 2.9* 2.5* 2.3*   No results for input(s): LIPASE, AMYLASE in the last 168 hours. Recent Labs  Lab 06/23/20 0832  AMMONIA 24    ABG    Component Value Date/Time   PHART 7.45 06/23/2020 2100   PCO2ART 45 06/23/2020 2100   PO2ART 243 (H) 06/23/2020 2100   HCO3 31.3 (H) 06/23/2020 2100   TCO2 32 05/05/2020 0200   ACIDBASEDEF 6.4 (H) 06/21/2020 0120   O2SAT 99.9 06/23/2020 2100     Coagulation Profile: No results for input(s): INR, PROTIME in the last 168 hours.  Cardiac Enzymes: No results for input(s): CKTOTAL, CKMB, CKMBINDEX, TROPONINI in the last 168 hours.  HbA1C: Hgb A1c MFr Bld  Date/Time Value Ref Range Status  06/21/2020 03:23 AM 8.2 (H) 4.8 - 5.6 % Final    Comment:    (NOTE) Pre diabetes:          5.7%-6.4%  Diabetes:              >6.4%  Glycemic control for   <7.0% adults with diabetes     CBG: Recent Labs  Lab 06/26/20 1618 06/26/20 1948 06/26/20 2359 06/27/20 0309 06/27/20 0718  GLUCAP 202* 249* 240* 242* 273*   CXR 06/27/20  reviewed The endotracheal tube terminates above the carina. The enteric tube extends below the left hemidiaphragm. There are persistent bilateral airspace opacities, not substantially changed from prior study. The right-sided PICC line is unchanged there is no pneumothorax. There are probable small bilateral pleural effusions. The esophageal temperature probe terminates in the mid esophagus level.  Assessment & Plan:  55 yo obese white female with acute and severe resp failure with acute and severe ARDS with b/l pneumonia due to COVID pneumonia with acute COPD exacerbation  Severe COVID-19 infection, ARDS and pneumonia/pneumonitis - Remdesevir antiviral - 1/10 to 1/15 - Methylprednisolone 40mg  BID: 1/12>> - Diuresis with lasix 20mg  IV and metolazone 5mg  PO today - Has been on unasyn for aspiration coverage, 7 day course, end date 1/19 - Neuro status is main barrier to extubation at this time - Wean sedation for SBT  Shock In setting of acute hypoxemic respiratory failure and sepsis due  to covid 19 pneumonia - Pressors weaned off overnight - Patient on stress dose steroids hydrocortisone 50mg  q6 hours. Will discontinue now that she is off pressors and will remain on solumedrol as above. - Continue midodrine 5mg  TID for now  Encephalopathy In setting of sepsis, hypoxemic respiratory failure and metabolic derangements - MRI brain with no acute abnormality 06/21/20 - Will try to wean off versed today and transition to precedex - Wean other sedatives as able - Start seroquel 50mg  qHS and 25mg  qAM  Hypernatremia -  Free water flushes at 24mL q2hr - Lasix and metolazone as above - repeat BMP in PM   DVT/GI PRX ordered and assessed TRANSFUSIONS AS NEEDED MONITOR FSBS I Assessed the need for Labs I Assessed the need for Foley I Assessed the need for Central Venous Line Family Discussion when available I Assessed the need for Mobilization I made an Assessment of medications to be  adjusted accordingly Safety Risk assessment completed  CASE DISCUSSED IN MULTIDISCIPLINARY ROUNDS WITH ICU TEAM    Critical Care Time devoted to patient care services described in this note is 45  minutes.   Overall, patient is critically ill, prognosis is guarded.  Patient with Multiorgan failure and at high risk for cardiac arrest and death.   Freda Jackson, MD [facility] Office: [phone]   See Amion for Pager Details

## 2020-06-28 ENCOUNTER — Inpatient Hospital Stay: Payer: Medicare HMO

## 2020-06-28 DIAGNOSIS — R4182 Altered mental status, unspecified: Secondary | ICD-10-CM | POA: Diagnosis not present

## 2020-06-28 DIAGNOSIS — R531 Weakness: Secondary | ICD-10-CM | POA: Diagnosis not present

## 2020-06-28 DIAGNOSIS — E1110 Type 2 diabetes mellitus with ketoacidosis without coma: Secondary | ICD-10-CM

## 2020-06-28 DIAGNOSIS — E870 Hyperosmolality and hypernatremia: Secondary | ICD-10-CM

## 2020-06-28 DIAGNOSIS — Z978 Presence of other specified devices: Secondary | ICD-10-CM

## 2020-06-28 LAB — BASIC METABOLIC PANEL
Anion gap: 10 (ref 5–15)
BUN: 35 mg/dL — ABNORMAL HIGH (ref 6–20)
CO2: 30 mmol/L (ref 22–32)
Calcium: 8.2 mg/dL — ABNORMAL LOW (ref 8.9–10.3)
Chloride: 102 mmol/L (ref 98–111)
Creatinine, Ser: 0.82 mg/dL (ref 0.44–1.00)
GFR, Estimated: >60 mL/min (ref >60)
Glucose, Bld: 151 mg/dL — ABNORMAL HIGH (ref 70–99)
Potassium: 2.9 mmol/L — ABNORMAL LOW (ref 3.5–5.1)
Sodium: 142 mmol/L (ref 135–145)

## 2020-06-28 LAB — GLUCOSE, CAPILLARY
Glucose-Capillary: 146 mg/dL — ABNORMAL HIGH (ref 70–99)
Glucose-Capillary: 208 mg/dL — ABNORMAL HIGH (ref 70–99)
Glucose-Capillary: 164 mg/dL — ABNORMAL HIGH (ref 70–99)
Glucose-Capillary: 237 mg/dL — ABNORMAL HIGH (ref 70–99)
Glucose-Capillary: 209 mg/dL — ABNORMAL HIGH (ref 70–99)
Glucose-Capillary: 175 mg/dL — ABNORMAL HIGH (ref 70–99)

## 2020-06-28 LAB — VITAMIN B12: Vitamin B-12: 1012 pg/mL — ABNORMAL HIGH (ref 180–914)

## 2020-06-28 LAB — MAGNESIUM: Magnesium: 2.1 mg/dL (ref 1.7–2.4)

## 2020-06-28 LAB — FOLATE: Folate: 10.3 ng/mL (ref >5.9)

## 2020-06-28 LAB — CBC WITH DIFFERENTIAL/PLATELET
Abs Immature Granulocytes: 0.44 10*3/uL — ABNORMAL HIGH (ref 0.00–0.07)
Basophils Absolute: 0 10*3/uL (ref 0.0–0.1)
Basophils Relative: 0 %
Eosinophils Absolute: 0 10*3/uL (ref 0.0–0.5)
Eosinophils Relative: 1 %
HCT: 27.9 % — ABNORMAL LOW (ref 36.0–46.0)
Hemoglobin: 9 g/dL — ABNORMAL LOW (ref 12.0–15.0)
Immature Granulocytes: 5 %
Lymphocytes Relative: 24 %
Lymphs Abs: 2 10*3/uL (ref 0.7–4.0)
MCH: 30.9 pg (ref 26.0–34.0)
MCHC: 32.3 g/dL (ref 30.0–36.0)
MCV: 95.9 fL (ref 80.0–100.0)
Monocytes Absolute: 0.7 10*3/uL (ref 0.1–1.0)
Monocytes Relative: 8 %
Neutro Abs: 5.3 10*3/uL (ref 1.7–7.7)
Neutrophils Relative %: 62 %
Platelets: 328 10*3/uL (ref 150–400)
RBC: 2.91 MIL/uL — ABNORMAL LOW (ref 3.87–5.11)
RDW: 15.2 % (ref 11.5–15.5)
WBC: 8.5 10*3/uL (ref 4.0–10.5)
nRBC: 2.9 % — ABNORMAL HIGH (ref 0.0–0.2)

## 2020-06-28 LAB — FIBRIN DERIVATIVES D-DIMER (ARMC ONLY): Fibrin derivatives D-dimer (ARMC): 1115.91 ng/mL (FEU) — ABNORMAL HIGH (ref 0.00–499.00)

## 2020-06-28 LAB — AMMONIA: Ammonia: 20 umol/L (ref 9–35)

## 2020-06-28 LAB — C-REACTIVE PROTEIN: CRP: 0.8 mg/dL (ref <1.0)

## 2020-06-28 LAB — TSH: TSH: 1.694 u[IU]/mL (ref 0.350–4.500)

## 2020-06-28 LAB — PHOSPHORUS: Phosphorus: 2.8 mg/dL (ref 2.5–4.6)

## 2020-06-28 LAB — FERRITIN: Ferritin: 375 ng/mL — ABNORMAL HIGH (ref 11–307)

## 2020-06-28 MED ORDER — DOCUSATE SODIUM 50 MG/5ML PO LIQD
100.0000 mg | Freq: Two times a day (BID) | ORAL | Status: DC
Start: 2020-06-28 — End: 2020-06-30
  Administered 2020-06-28 – 2020-06-30 (×5): 100 mg
  Filled 2020-06-28 (×5): qty 10

## 2020-06-28 MED ORDER — POLYETHYLENE GLYCOL 3350 17 G PO PACK: 17.0000 g | PACK | Freq: Every day | ORAL | Status: DC | PRN

## 2020-06-28 MED ORDER — POTASSIUM CHLORIDE 20 MEQ PO PACK
40.0000 meq | PACK | ORAL | Status: AC
  Administered 2020-06-28 (×2): 40 meq
  Filled 2020-06-28 (×2): qty 2

## 2020-06-28 MED ORDER — QUETIAPINE FUMARATE 25 MG PO TABS
25.0000 mg | ORAL_TABLET | Freq: Every day | ORAL | Status: DC
  Administered 2020-06-28 – 2020-06-29 (×2): 25 mg
  Filled 2020-06-28 (×2): qty 1

## 2020-06-28 MED ORDER — THIAMINE HCL 100 MG PO TABS
100.0000 mg | ORAL_TABLET | Freq: Every day | ORAL | Status: DC
  Administered 2020-06-28 – 2020-07-06 (×6): 100 mg via ORAL
  Filled 2020-06-28 (×7): qty 1

## 2020-06-28 MED ORDER — FREE WATER
200.0000 mL | Status: DC
  Administered 2020-06-28 – 2020-06-29 (×5): 200 mL

## 2020-06-28 MED ORDER — MIDODRINE HCL 5 MG PO TABS
5.0000 mg | ORAL_TABLET | Freq: Three times a day (TID) | ORAL | Status: DC
  Administered 2020-06-28 – 2020-06-29 (×3): 5 mg
  Filled 2020-06-28 (×4): qty 1

## 2020-06-28 MED ORDER — MIDAZOLAM HCL 2 MG/2ML IJ SOLN: 2.0000 mg | INTRAMUSCULAR | Status: DC | PRN

## 2020-06-28 MED ORDER — MIDAZOLAM HCL 2 MG/2ML IJ SOLN
INTRAMUSCULAR | Status: AC
  Administered 2020-06-28: 2 mg via INTRAVENOUS
  Filled 2020-06-28: qty 2

## 2020-06-28 MED ORDER — QUETIAPINE FUMARATE 25 MG PO TABS
50.0000 mg | ORAL_TABLET | Freq: Every day | ORAL | Status: DC
  Administered 2020-06-28 – 2020-06-29 (×2): 50 mg
  Filled 2020-06-28 (×2): qty 2

## 2020-06-28 MED ORDER — THIAMINE HCL 100 MG/ML IJ SOLN
100.0000 mg | Freq: Every day | INTRAMUSCULAR | Status: DC
  Administered 2020-06-29 – 2020-07-02 (×3): 100 mg via INTRAVENOUS
  Filled 2020-06-28 (×3): qty 2

## 2020-06-28 NOTE — Procedures (Signed)
Patient Name: Ashley [name]  MRN: [id number]  Epilepsy Attending: Lora Havens  Referring Physician/Provider: Dr Donnetta Simpers Date: 06/28/2020 Duration: 22.04 mins  Patient history: 55yo F with ams. EEG to evaluate for seizure  Level of alertness:  comatose  AEDs during EEG study: Propofol  Technical aspects: This EEG study was done with scalp electrodes positioned according to the 10-20 International system of electrode placement. Electrical activity was acquired at a sampling rate of 500Hz  and reviewed with a high frequency filter of 70Hz  and a low frequency filter of 1Hz . EEG data were recorded continuously and digitally stored.   Description: EEG showed continuous generalized 3 to 6 Hz theta-delta slowing. Intermittent generalized periodic discharges with triphasic morphology at 1-1.5hz  were also noted. Hyperventilation and photic stimulation were not performed.     ABNORMALITY -Continuous slow, generalized -Periodic discharges with triphasic morphology, generalized  IMPRESSION: This study showed showed periodic discharges with triphasic morphology which can be on the ictal-interictal continuum but given the morphology and frequency are more likely due to toxic-metabolic etiology. There is also  Severe diffuse encephalopathy, nonspecific etiology. No seizures were seen throughout the recording.  Tecia Cinnamon Barbra Sarks

## 2020-06-28 NOTE — Progress Notes (Signed)
Pt was ransported to CT from CCU and back while on he vent.

## 2020-06-28 NOTE — Consult Note (Signed)
NEUROLOGY CONSULTATION NOTE   Date of service: June 28, 2020 Patient Name: Ashley [name] MRN:  [id number] DOB:  1966/05/24 Reason for consult: "persistent encephalopathy" _ _ _   _ __   _ __ _ _  __ __   _ __   __ _  History of Present Illness  Ashley [name] is a 55 y.o. female hx of asthma, Diabetes, glaucoma, HTN, HLD, Hypothyroidism, OSA, neuropathy who is admitted with aute encephalopathy in the setting of COVID 19 Pneumonia and DKA/HHS. She initially presented with multifocal Pneumonia and DKA on 06/21/20. Anion gap was closed and glycemic control was improved. She was started on Vanc and Cefepime for fevers with Tmax of 105, AMS, elevated procalcitonin. She was transferred to ICU on 06/23/20 and intubated the same day. She failed extubation, continues to be encephalopathic and neurology team was asked to assist with further workup.  MRI Brain on 1/11 with no acute abnormality. No fevers over the last 24 hours, vitals are stable, she is on Ampicillin/Sulbactam, Methylprednisone, Fentanyl, Propofol.   ROS   Unable to obtain due to intubation and encephalopathy.  Past History   Past Medical History:  Diagnosis Date  . Anxiety   . Asthma   . Depression   . Diabetes mellitus ([place])   . Esophageal stricture   . Gastroparesis   . GERD (gastroesophageal reflux disease)   . Glaucoma   . Graves disease 02/2010   diagnosed by radioactive iodine uptake and scan   . Helicobacter pylori infection   . Hyperlipidemia   . Hypertension   . Hypothyroidism   . Insulin pump in place   . Nephrolithiasis 2010   rt sided per GI  . Neuropathy    diabetic neuropathy  . Osteoporosis   . Sleep apnea   . Sleep apnea with use of continuous positive airway pressure (CPAP)   . Vitamin D deficiency    Past Surgical History:  Procedure Laterality Date  . ABDOMINAL AORTOGRAM W/LOWER EXTREMITY Bilateral 01/01/2019   Procedure: ABDOMINAL AORTOGRAM W/LOWER EXTREMITY;  Surgeon: Lorretta Harp,  MD;  Location: [facility];  Service: Cardiovascular;  Laterality: Bilateral;  . BREAST EXCISIONAL BIOPSY    . BREAST LUMPECTOMY Left    LEFT-BENIGN  . NASAL SEPTOPLASTY W/ TURBINOPLASTY Bilateral 09/07/2013   Procedure: SEPTOPLASTY, BILATERAL TURBINATE RESECTION ;  Surgeon: Ascencion Dike, MD;  Location: [facility];  Service: ENT;  Laterality: Bilateral;  . PERIPHERAL VASCULAR INTERVENTION Bilateral 01/01/2019   Procedure: PERIPHERAL VASCULAR INTERVENTION;  Surgeon: Lorretta Harp, MD;  Location: [facility];  Service: Cardiovascular;  Laterality: Bilateral;  . UPPER GASTROINTESTINAL ENDOSCOPY    . VAGINAL HYSTERECTOMY  2006   Family History  Problem Relation Age of Onset  . Diabetes Father   . Heart attack Father   . Alcoholism Father   . Lung cancer Mother   . Liver cancer Mother   . Irritable bowel syndrome Sister   . Colon cancer Neg Hx   . Esophageal cancer Neg Hx   . Rectal cancer Neg Hx   . Stomach cancer Neg Hx    Social History   Socioeconomic History  . Marital status: Married    Spouse name: Not on file  . Number of children: 1  . Years of education: Not on file  . Highest education level: Not on file  Occupational History  . Not on file  Tobacco Use  . Smoking status: Current Some Day Smoker  Packs/day: 0.50    Years: 25.00    Pack years: 12.50    Types: Cigarettes  . Smokeless tobacco: Never Used  . Tobacco comment: 6-8 CIGS A DAY  Vaping Use  . Vaping Use: Never used  Substance and Sexual Activity  . Alcohol use: No    Alcohol/week: 0.0 standard drinks  . Drug use: No  . Sexual activity: Not on file  Other Topics Concern  . Not on file  Social History Narrative   Lives home with husband.  Education 12th grade.  Caffeine 4-5 drinks daily.   Social Determinants of Health   Financial Resource Strain: Not on file  Food Insecurity: Not on file  Transportation Needs: Not on file  Physical Activity: Not on file  Stress:  Not on file  Social Connections: Not on file   Allergies  Allergen Reactions  . Mirtazapine Rash    And hives  . Clindamycin Hives  . Other Hives    Tylox   . Sulfonamide Derivatives Hives  . Lipitor [Atorvastatin] Other (See Comments)    MUSCLE ACHES  . Restasis [Cyclosporine]     Pt stated, "My eyes turned red on the inside and outside of eye; burning sensation"  . Topamax [Topiramate] Itching    Medications   Medications Prior to Admission  Medication Sig Dispense Refill Last Dose  . albuterol (PROVENTIL) (2.5 MG/3ML) 0.083% nebulizer solution Take 2.5 mg by nebulization every 4 (four) hours as needed for wheezing or shortness of breath.   3 Past Month at Unknown time  . ALPRAZolam (XANAX) 0.5 MG tablet Take 0.5 mg by mouth 3 (three) times daily as needed for anxiety.    Past Month at Unknown time  . amLODipine (NORVASC) 5 MG tablet Take 1 tablet (5 mg total) by mouth daily. (Patient taking differently: Take 5 mg by mouth in the morning and at bedtime.) 90 tablet 3 Past Month at Unknown time  . busPIRone (BUSPAR) 15 MG tablet Take 15 mg by mouth 2 (two) times daily.   Past Month at Unknown time  . clopidogrel (PLAVIX) 75 MG tablet TAKE 1 TABLET (75 MG TOTAL) BY MOUTH DAILY WITH BREAKFAST. 90 tablet 2 Past Month at Unknown time  . DULoxetine (CYMBALTA) 30 MG capsule Take 30 mg by mouth every morning. Takes 90 mg total  1 Past Month at Unknown time  . DULoxetine (CYMBALTA) 60 MG capsule Take 60 mg by mouth every morning. Takes 90 mg total   Past Month at Unknown time  . estradiol (ESTRACE) 0.5 MG tablet Take 0.5 mg by mouth daily.   Past Month at Unknown time  . famotidine (PEPCID) 40 MG tablet Take 1 tablet (40 mg total) by mouth at bedtime. 90 tablet 3 Past Month at Unknown time  . folic acid (FOLVITE) 1 MG tablet Take 1 tablet by mouth daily.   Past Month at Unknown time  . hydrochlorothiazide (MICROZIDE) 12.5 MG capsule TAKE 1 CAPSULE EVERY DAY (Patient taking differently: Take  12.5 mg by mouth daily.) 90 capsule 3 Past Month at Unknown time  . ibandronate (BONIVA) 150 MG tablet Take 150 mg by mouth every 30 (thirty) days.   Past Month at Unknown time  . Insulin Human (INSULIN PUMP) SOLN Inject into the skin.   Past Month at Unknown time  . lansoprazole (PREVACID) 30 MG capsule Take 1 capsule (30 mg total) by mouth in the morning and at bedtime. 180 capsule 2 Past Month at Unknown time  . latanoprost (  XALATAN) 0.005 % ophthalmic solution Place 1 drop into both eyes at bedtime.  11 Past Month at Unknown time  . levocetirizine (XYZAL) 5 MG tablet Take 5 mg by mouth every morning.    Past Month at Unknown time  . levothyroxine (SYNTHROID, LEVOTHROID) 112 MCG tablet Take 112 mcg by mouth daily before breakfast.   0 Past Month at Unknown time  . losartan (COZAAR) 100 MG tablet Take 100 mg by mouth at bedtime.    Past Month at Unknown time  . methotrexate (RHEUMATREX) 2.5 MG tablet 5 tablets by moth twice a day once a week   Past Month at Unknown time  . metoprolol (TOPROL-XL) 200 MG 24 hr tablet Take 200 mg by mouth daily. One tablet by mouth once daily    Past Month at Unknown time  . montelukast (SINGULAIR) 10 MG tablet Take 10 mg by mouth at bedtime.   Past Month at Unknown time  . NOVOLOG 100 UNIT/ML injection Inject into the skin See admin instructions. Pt has Insulin pump   Past Month at Unknown time  . pregabalin (LYRICA) 75 MG capsule Take 150 mg by mouth at bedtime.    Past Month at Unknown time  . promethazine (PHENERGAN) 25 MG tablet Take 25 mg by mouth every 4 (four) hours as needed for nausea or vomiting. Every 4-6 hours as needed   Past Month at Unknown time  . rosuvastatin (CRESTOR) 20 MG tablet Take 20 mg by mouth at bedtime.   Past Month at Unknown time  . Vitamin D, Ergocalciferol, (DRISDOL) 50000 UNITS CAPS Take 50,000 Units by mouth every 7 (seven) days.   Past Month at Unknown time  . ACCU-CHEK COMPACT PLUS test strip 1 each by Other route as needed.      Marland Kitchen  ACCU-CHEK SOFTCLIX LANCETS lancets      . AMBULATORY NON FORMULARY MEDICATION NOVOLOG INSULIN PUMP USES AS DIRECTED        Vitals   Vitals:   06/28/20 0321 06/28/20 0400 06/28/20 0500 06/28/20 0600  BP:  (!) 127/56 (!) 116/56 (!) 115/55  Pulse:  (!) 59 (!) 58 (!) 56  Resp:  (!) 22 16 16   Temp:  (!) 95.54 F (35.3 C) 98.96 F (37.2 C) 98.42 F (36.9 C)  TempSrc:  Esophageal Esophageal Esophageal  SpO2: 97% 96% 94% 97%  Weight:   66.1 kg   Height:         Body mass index is 29.43 kg/m.  Physical Exam   General: intubated; in no acute distress. HENT: Normal oropharynx and mucosa. Normal external appearance of ears and nose. Neck: Supple, no pain or tenderness CV: No JVD. No peripheral edema. Pulmonary: Symmetric Chest rise. Breathing over vent. Abdomen: Soft to touch, non-tender. Ext: No cyanosis, edema, or deformity Skin: No rash. Normal palpation of skin.  Musculoskeletal: Normal digits and nails by inspection. No clubbing.   Neurologic Examination on propofol @ 35 and Fentanyl @ 200  Mental status/Cognition: Partially opens eyes to loud voice and clap. Does not look at my face. Does not look towards voice, does not blink to threat. No speech, no mouthing of words. No grimace to nares stimulation. Does not move arms or legs to nares stimulation. Eyes open up more with eliciting cough reflex.  Brainstem reflexes: Positive corneals BL Pupils 50mm, equal, round and reactive to light Cough: Positive Gag: unable to elicit Dolls eyes: normal/positive (eyes remain stationary with moving her head)  Motor/sensory:  Muscle bulk: normal, tone  flacced. Holds her arms on her stomach, in mitts. She does localizes all extremities to pinching proximal part of all extremities.  Reflexes:  Right Left Comments  Pectoralis      Biceps (C5/6) 1 1   Brachioradialis (C5/6) 1 1    Triceps (C6/7) 1 1    Patellar (L3/4) 1 1    Achilles (S1)      Hoffman      Plantar mute mute   Jaw  jerk     Labs   CBC:  Recent Labs  Lab 06/26/20 0524 06/27/20 0312 06/28/20 0606  WBC 10.8* 10.5 8.5  NEUTROABS 8.8*  --  5.3  HGB 9.6* 9.3* 9.0*  HCT 30.6* 29.0* 27.9*  MCV 97.5 97.6 95.9  PLT 336 354 [id number]    Basic Metabolic Panel:  Lab Results  Component Value Date   NA 142 06/28/2020   K 2.9 (L) 06/28/2020   CO2 30 06/28/2020   GLUCOSE 151 (H) 06/28/2020   BUN 35 (H) 06/28/2020   CREATININE 0.82 06/28/2020   CALCIUM 8.2 (L) 06/28/2020   GFRNONAA >60 06/28/2020   GFRAA 93 01/30/2019   Lipid Panel:  Lab Results  Component Value Date   LDLCALC 50 01/09/2019   HgbA1c:  Lab Results  Component Value Date   HGBA1C 8.2 (H) 06/21/2020   Urine Drug Screen:     Component Value Date/Time   LABOPIA POSITIVE (A) 05/05/2020 0053   COCAINSCRNUR NONE DETECTED 05/05/2020 0053   LABBENZ POSITIVE (A) 05/05/2020 0053   AMPHETMU NONE DETECTED 05/05/2020 0053   THCU NONE DETECTED 05/05/2020 0053   LABBARB NONE DETECTED 05/05/2020 0053    Alcohol Level     Component Value Date/Time   ETH <10 05/05/2020 0209    CT Head without contrast: Mild chronic ischemic white matter disease. No acute intracranial abnormality seen.  MRI Brain with and without contrast 06/21/20: No acute abnormality. Mild white matter changes similar to 2010. Mild atrophy with progression since 2010.  rEEG: pending.  Impression   Ashley [name] is a 55 y.o. female  who is admitted with aute encephalopathy in the setting of COVID 19 Pneumonia and DKA/HHS with anion gap closed and glycemic control achieved who we were asked to evaluate for persistent encephalopathy resulting in difficulty with extubation. Neuro exam on propofol and fentanyl with obtunded mentation and absence of gag but rest f the brainstem reflexes present. I do agree with team that she is more encephalopathic than I would expect for a patient of her age to be. I am not particularly worried about CNS infection or brainstem stroke  given no fever, no leukocytosis and a negative MRI Brain. Will get a routine EEG to assess for epileptogenic abrnomalities to see if that may be contributing. Will assess for nutritional causes of AMS and get a repeat CTH to assess for any changes since her last MRI Brain 6 days ago.  Recommendations  - I ordered serum Ammonia, TSH, Vit B12 with MMA, Thiamine, Folate, RPR - I ordered routine EEG - I ordered a routine CTH without contrast. - Thiamine 100mg  PO or IV daily. ______________________________________________________________________   Thank you for the opportunity to take part in the care of this patient. If you have any further questions, please contact the neurology consultation attending.  Signed,  [place] Pager Number [phone] _ _ _   _ __   _ __ _ _  __ __   _ __   __ _

## 2020-06-28 NOTE — Progress Notes (Signed)
PHARMACY CONSULT NOTE - FOLLOW UP  Pharmacy Consult for Electrolyte Monitoring and Replacement   Recent Labs: Potassium (mmol/L)  Date Value  06/28/2020 2.9 (L)   Magnesium (mg/dL)  Date Value  06/28/2020 2.1   Calcium (mg/dL)  Date Value  06/28/2020 8.2 (L)   Albumin (g/dL)  Date Value  06/26/2020 2.3 (L)  01/09/2019 4.5   Phosphorus (mg/dL)  Date Value  06/28/2020 2.8   Sodium (mmol/L)  Date Value  06/28/2020 142  01/30/2019 142     Assessment: 55 year old female admitted with metabolic encephalopathy in setting of Covid and DKA/HHS. Patient required intubation 1/13. She has been on and off of the insulin drip. Currently remains off drip on subcu regimen. Pharmacy to manage electrolytes.   Goal of Therapy:  Electrolytes WNL  Plan:  Potassium 40 mEq per tube x 2 doses. Follow up with morning labs.  Dorena Bodo, PharmD Clinical Pharmacist 06/28/2020

## 2020-06-28 NOTE — Progress Notes (Signed)
NAME:  Ashley [name], MRN:  [id number], DOB:  March 21, 1966, LOS: 7 ADMISSION DATE:  06/20/2020, CONSULTATION DATE:  06/23/2020 REFERRING MD:  Dr. Priscella Mann, CHIEF COMPLAINT:  Altered Mental Status, COVID-19 Pneumonia  Brief History:  55 y.o. Female admitted with Acute Metabolic Encephalopathy in the setting of COVID-19 Pneumonia & DKA/HHS  History of Present Illness:  55 year old female w/ an extensive past medical history as per medical record who presents for lethargy, subjective fevers at home, decreased level of consciousness. Found to have multifocal pneumonia in the setting of COVID-19 infection. Also with elevated procalcitonin concerning for bacterial pulm infection. Also presented with DKA/HHS. DKA improved off insulin gtt, oxygenation status has improved but encephalopathy has worsened and precluded extubation.     06/24/20- patient on MV critically ill.  Labwork this am with hypernatremia, aki, transaminitis, hypokalemia. CXR with bilateral air space opacification worse at R[facility].   Weaning levophed.  I spoke with husband [name] and reviewed care plan with him, he will be coming in this afternoon to see patient.  06/25/20- patient failed SBT today. WUA required ativan due to aggitation.  06/26/20- patient remain on MV with complete weaning off levophed support this am.  Will attempt SBT today, had altered mentation yesterday.  Optimizing metabolic derrangements to improve SBT.  06/27/20 - Start precedex and wean off versed due to agitation/delirium 06/28/20- Remains encephalopathic. Restarted on Propofol gtt yesterday due to vent dyschronny. Neurology consulted today. Wean sedation as tolerated   Past Medical History:   Active Ambulatory Problems    Diagnosis Date Noted  . DIAB W/O MENTION COMP TYPE I [JUV TYPE] UNCNTRL 02/28/2009  . GERD 02/28/2009  . Gastroparesis 03/22/2009  . DYSPHAGIA UNSPECIFIED 02/28/2009  . DYSPHAGIA 03/22/2009  . ABDOMINAL PAIN-EPIGASTRIC 02/28/2009  .  Nausea with vomiting 10/22/2012  . Diarrhea 10/22/2012  . Dyspnea and respiratory abnormality 04/26/2015  . Chest tightness 04/26/2015  . Hoarseness of voice 04/26/2015  . OSA and COPD overlap syndrome ([place]) 04/02/2018  . Excessive daytime sleepiness 04/02/2018  . Delayed sleep phase syndrome 04/02/2018  . Moderate persistent asthma 04/02/2018  . Loud snoring 05/19/2018  . OSA on CPAP 10/08/2018  . Essential hypertension 12/25/2018  . Hyperlipidemia 12/25/2018  . Peripheral arterial disease ([place]) 12/25/2018  . Claudication in peripheral vascular disease ([place]) 01/01/2019  . Tobacco abuse 02/24/2020  . Hypertensive disorder 12/09/2018   Resolved Ambulatory Problems    Diagnosis Date Noted  . No Resolved Ambulatory Problems   Past Medical History:  Diagnosis Date  . Anxiety   . Asthma   . Depression   . Diabetes mellitus ([place])   . Esophageal stricture   . GERD (gastroesophageal reflux disease)   . Glaucoma   . Graves disease 02/2010  . Helicobacter pylori infection   . Hypertension   . Hypothyroidism   . Insulin pump in place   . Nephrolithiasis 2010  . Neuropathy   . Osteoporosis   . Sleep apnea   . Sleep apnea with use of continuous positive airway pressure (CPAP)   . Vitamin D deficiency      Significant Hospital Events:  1/10: Presented to ED; Hospitalist admit to [facility] 1/13: PCCM consult  Consults:  Hospitalist (primary service) Neurology PCCM Diabetes Coordinator  Procedures:  N/A  Significant Diagnostic Tests:  1/11: CT Head w/o Contrast>>Mild chronic ischemic white matter disease. No acute intracranial abnormality seen. 1/11: CTA Chest>>1. Extensive motion artifact limits assessment without signs of central or lobar level pulmonary embolism. 2. Diffuse areas  of consolidative change, nodularity and ground-glass. Findings show midlung and basilar predominance. Findings of multifocal pneumonia in the setting of COVID-19 infection. 3. Mild  mediastinal and hilar nodal enlargement likely reactive. 4. Aortic atherosclerosis. 1/11: MR Brain w/o contrast>>No acute abnormality. Mild white matter changes similar to 2010. Mild atrophy with progression since 2010.  Micro Data:  1/11: SARS-CoV-2 PCR>> Positive 1/11: Influenza A&B PCR>>negative  Antimicrobials:   Anti-infectives (From admission, onward)   Start     Dose/Rate Route Frequency Ordered Stop   06/23/20 1800  Ampicillin-Sulbactam (UNASYN) 3 g in sodium chloride 0.9 % 100 mL IVPB        3 g 200 mL/hr over 30 Minutes Intravenous Every 6 hours 06/23/20 1519 06/29/20 1200   06/22/20 2200  ceFEPIme (MAXIPIME) 2 g in sodium chloride 0.9 % 100 mL IVPB  Status:  Discontinued        2 g 200 mL/hr over 30 Minutes Intravenous Every 8 hours 06/22/20 1138 06/23/20 1519   06/22/20 2200  vancomycin (VANCOREADY) IVPB 750 mg/150 mL  Status:  Discontinued        750 mg 150 mL/hr over 60 Minutes Intravenous Every 12 hours 06/22/20 1138 06/23/20 1519   06/22/20 1000  remdesivir 100 mg in sodium chloride 0.9 % 100 mL IVPB  Status:  Discontinued       "Followed by" Linked Group Details   100 mg 200 mL/hr over 30 Minutes Intravenous Daily 06/21/20 0126 06/21/20 0630   06/22/20 1000  vancomycin (VANCOCIN) IVPB 1000 mg/200 mL premix  Status:  Discontinued        1,000 mg 200 mL/hr over 60 Minutes Intravenous Every 24 hours 06/21/20 0816 06/22/20 1139   06/21/20 1200  ceFEPIme (MAXIPIME) 2 g in sodium chloride 0.9 % 100 mL IVPB  Status:  Discontinued        2 g 200 mL/hr over 30 Minutes Intravenous Every 12 hours 06/21/20 0816 06/22/20 1139   06/21/20 1000  remdesivir 100 mg in sodium chloride 0.9 % 100 mL IVPB  Status:  Discontinued       "Followed by" Linked Group Details   100 mg 200 mL/hr over 30 Minutes Intravenous Daily 06/20/20 2359 06/21/20 0002   06/21/20 1000  remdesivir 100 mg in sodium chloride 0.9 % 100 mL IVPB       "Followed by" Linked Group Details   100 mg 200 mL/hr over  30 Minutes Intravenous Daily 06/21/20 0002 06/24/20 0948   06/21/20 1000  cefTRIAXone (ROCEPHIN) 2 g in sodium chloride 0.9 % 100 mL IVPB  Status:  Discontinued        2 g 200 mL/hr over 30 Minutes Intravenous Every 24 hours 06/21/20 0639 06/21/20 0723   06/21/20 1000  azithromycin (ZITHROMAX) 500 mg in sodium chloride 0.9 % 250 mL IVPB  Status:  Discontinued        500 mg 250 mL/hr over 60 Minutes Intravenous Every 24 hours 06/21/20 0639 06/21/20 0723   06/21/20 0945  vancomycin (VANCOREADY) IVPB 500 mg/100 mL        500 mg 100 mL/hr over 60 Minutes Intravenous  Once 06/21/20 0932 06/21/20 1126   06/21/20 0900  vancomycin (VANCOREADY) IVPB 1500 mg/300 mL  Status:  Discontinued        1,500 mg 150 mL/hr over 120 Minutes Intravenous  Once 06/21/20 0816 06/21/20 0932   06/21/20 0645  vancomycin (VANCOCIN) IVPB 1000 mg/200 mL premix        1,000 mg 200 mL/hr over  60 Minutes Intravenous  Once 06/21/20 6578 06/21/20 0834   06/21/20 0645  piperacillin-tazobactam (ZOSYN) IVPB 3.375 g        3.375 g 100 mL/hr over 30 Minutes Intravenous  Once 06/21/20 4696 06/21/20 0716   06/21/20 0645  cefTRIAXone (ROCEPHIN) 2 g in sodium chloride 0.9 % 100 mL IVPB  Status:  Discontinued        2 g 200 mL/hr over 30 Minutes Intravenous Every 24 hours 06/21/20 0634 06/21/20 0634   06/21/20 0645  azithromycin (ZITHROMAX) 500 mg in sodium chloride 0.9 % 250 mL IVPB  Status:  Discontinued        500 mg 250 mL/hr over 60 Minutes Intravenous Every 24 hours 06/21/20 0634 06/21/20 0634   06/21/20 0130  remdesivir 200 mg in sodium chloride 0.9% 250 mL IVPB  Status:  Discontinued       "Followed by" Linked Group Details   200 mg 580 mL/hr over 30 Minutes Intravenous Once 06/21/20 0126 06/21/20 0630   06/21/20 0015  remdesivir 200 mg in sodium chloride 0.9% 250 mL IVPB       "Followed by" Linked Group Details   200 mg 580 mL/hr over 30 Minutes Intravenous Once 06/21/20 0002 06/21/20 0251   06/21/20 0000  remdesivir  200 mg in sodium chloride 0.9% 250 mL IVPB  Status:  Discontinued       "Followed by" Linked Group Details   200 mg 580 mL/hr over 30 Minutes Intravenous Once 06/20/20 2359 06/21/20 0002        Objective   Blood pressure (!) 115/55, pulse (!) 56, temperature 98.42 F (36.9 C), temperature source Esophageal, resp. rate 16, height 4\' 11"  (1.499 m), weight 66.1 kg, SpO2 97 %.    Vent Mode: PRVC FiO2 (%):  [35 %] 35 % Set Rate:  [16 bmp] 16 bmp Vt Set:  [480 mL] 480 mL PEEP:  [5 cmH20] 5 cmH20 Plateau Pressure:  [20 cmH20-23 cmH20] 20 cmH20   Intake/Output Summary (Last 24 hours) at 06/28/2020 0924 Last data filed at 06/28/2020 0902 Gross per 24 hour  Intake 1364.07 ml  Output 3050 ml  Net -1685.93 ml   Filed Weights   06/25/20 0500 06/26/20 0500 06/28/20 0500  Weight: 63.4 kg 63 kg 66.1 kg    PHYSICAL EXAMINATION:  GENERAL: intubated and sedated.  HEAD: Normocephalic, atraumatic.  EYES: Pupils equal, round, reactive to light.  No scleral icterus.  MOUTH: Moist mucosal membrane. Sclera anicteric. NECK: Supple. No JVD. PULMONARY: diminished breath sounds throughout, course though. No wheezing. CARDIOVASCULAR: S1S2. Regular rate and rhythm. No murmurs, rubs, or gallops.  GASTROINTESTINAL: Soft, nontender, -distended. Positive bowel sounds.  MUSCULOSKELETAL: No swelling, clubbing, or edema.  NEUROLOGIC: awakes to verbal stimuli. No purposeful movements.  SKIN: intact, no rashes  Labs   CBC: Recent Labs  Lab 06/23/20 0222 06/24/20 0334 06/25/20 0614 06/26/20 0524 06/27/20 0312 06/28/20 0606  WBC 7.2 17.9* 14.6* 10.8* 10.5 8.5  NEUTROABS 5.8 15.1* 12.1* 8.8*  --  5.3  HGB 12.6 11.2* 9.7* 9.6* 9.3* 9.0*  HCT 37.7 33.7* 30.1* 30.6* 29.0* 27.9*  MCV 91.5 93.1 95.3 97.5 97.6 95.9  PLT 196 321 339 336 354 295    Basic Metabolic Panel: Recent Labs  Lab 06/24/20 0334 06/24/20 2029 06/25/20 0614 06/26/20 0524 06/27/20 0312 06/27/20 1511 06/28/20 0606  NA 159*    < > 152* 151* 152* 148* 142  K 3.0*   < > 4.0 4.2 4.3 3.5 2.9*  CL 118*   < >  113* 111 113* 104 102  CO2 30   < > 27 28 31 28 30   GLUCOSE 78   < > 195* 245* 261* 232* 151*  BUN 46*   < > 36* 36* 43* 44* 35*  CREATININE 1.25*   < > 0.93 0.84 0.92 0.93 0.82  CALCIUM 8.4*   < > 8.1* 8.4* 8.2* 8.6* 8.2*  MG 2.6*  --  2.4 2.5* 2.7*  --  2.1  PHOS 1.7*  --  3.7 4.4 4.5  --  2.8   < > = values in this interval not displayed.   GFR: Estimated Creatinine Clearance: 64.9 mL/min (by C-G formula based on SCr of 0.82 mg/dL). Recent Labs  Lab 06/21/20 1114 06/22/20 0359 06/22/20 [id number] 06/23/20 0222 06/24/20 0334 06/25/20 [id number] 06/26/20 0524 06/27/20 0312 06/28/20 0606  PROCALCITON  --   --   --   --  0.70 0.13 <0.10  --   --   WBC  --    < >  --    < > 17.9* 14.6* 10.8* 10.5 8.5  LATICACIDVEN 2.6*  --  2.3*  --   --   --   --   --   --    < > = values in this interval not displayed.    Liver Function Tests: Recent Labs  Lab 06/22/20 0359 06/23/20 0222 06/24/20 0334 06/25/20 0614 06/26/20 0524  AST 86* 57* 42* 24 24  ALT 41 35 26 21 18   ALKPHOS 60 65 76 74 76  BILITOT 0.9 1.1 0.7 0.5 0.7  PROT 6.6 6.8 5.9* 5.3* 5.5*  ALBUMIN 3.3* 3.3* 2.9* 2.5* 2.3*   No results for input(s): LIPASE, AMYLASE in the last 168 hours. Recent Labs  Lab 06/23/20 0832  AMMONIA 24    ABG    Component Value Date/Time   PHART 7.45 06/23/2020 2100   PCO2ART 45 06/23/2020 2100   PO2ART 243 (H) 06/23/2020 2100   HCO3 31.3 (H) 06/23/2020 2100   TCO2 32 05/05/2020 0200   ACIDBASEDEF 6.4 (H) 06/21/2020 0120   O2SAT 99.9 06/23/2020 2100     Coagulation Profile: No results for input(s): INR, PROTIME in the last 168 hours.  Cardiac Enzymes: No results for input(s): CKTOTAL, CKMB, CKMBINDEX, TROPONINI in the last 168 hours.  HbA1C: Hgb A1c MFr Bld  Date/Time Value Ref Range Status  06/21/2020 03:23 AM 8.2 (H) 4.8 - 5.6 % Final    Comment:    (NOTE) Pre diabetes:          5.7%-6.4%  Diabetes:               >6.4%  Glycemic control for   <7.0% adults with diabetes     CBG: Recent Labs  Lab 06/27/20 1608 06/27/20 1920 06/27/20 2323 06/28/20 0324 06/28/20 0803  GLUCAP 209* 241* 137* 146* 208*   CXR 06/27/20 reviewed The endotracheal tube terminates above the carina. The enteric tube extends below the left hemidiaphragm. There are persistent bilateral airspace opacities, not substantially changed from prior study. The right-sided PICC line is unchanged there is no pneumothorax. There are probable small bilateral pleural effusions. The esophageal temperature probe terminates in the mid esophagus level.  Assessment & Plan:   Acute hypoxemic respiratory failure COVID 19 infection Hx COPD - Remdesevir antiviral - 1/10 to 1/15 - Methylprednisolone 40mg  daily: 1/12>>continue to wean - Has been on unasyn for aspiration coverage, 7 day course, end date 1/19 - Mental status continues to preclude extubation - Wean  propofol back off today as tolerated - VAP Bundle  Shock-Resolved - Continue midodrine 5mg  TID for now  Encephalopathy - MRI brain with no acute abnormality 06/21/20 - Started on seroquel 50mg  qHS and 25mg  qAM 1/17 - Wean propofol as tolerated - Normal LFTs 1/6 - Consult neurology  Hypernatremia - Improving Na 142 today down from 152 - Decreased Free water flushes to 225mL q4hr from Q2hr - Repeat BMP in AM  FEN: --No MIVFs --Mg/K protocol --On Enteral feeds  Prophylaxis: --DVT: Lovenox --GI: Protonix --Bowel: Colace  Family Engagement: No family present at this time. Will update by phone today. Code Status: Full Code Dispo: To remain in ICU. Patient discussed w/Dr.Bashir    Tonye Royalty ACNP-BC

## 2020-06-28 NOTE — Progress Notes (Signed)
eeg done °

## 2020-06-29 DIAGNOSIS — Z978 Presence of other specified devices: Secondary | ICD-10-CM

## 2020-06-29 DIAGNOSIS — G9340 Encephalopathy, unspecified: Secondary | ICD-10-CM

## 2020-06-29 LAB — CBC WITH DIFFERENTIAL/PLATELET
Abs Immature Granulocytes: 0.42 10*3/uL — ABNORMAL HIGH (ref 0.00–0.07)
Basophils Absolute: 0 10*3/uL (ref 0.0–0.1)
Basophils Relative: 0 %
Eosinophils Absolute: 0 10*3/uL (ref 0.0–0.5)
Eosinophils Relative: 0 %
HCT: 26.1 % — ABNORMAL LOW (ref 36.0–46.0)
Hemoglobin: 8.4 g/dL — ABNORMAL LOW (ref 12.0–15.0)
Immature Granulocytes: 5 %
Lymphocytes Relative: 21 %
Lymphs Abs: 1.9 10*3/uL (ref 0.7–4.0)
MCH: 30.9 pg (ref 26.0–34.0)
MCHC: 32.2 g/dL (ref 30.0–36.0)
MCV: 96 fL (ref 80.0–100.0)
Monocytes Absolute: 0.9 10*3/uL (ref 0.1–1.0)
Monocytes Relative: 9 %
Neutro Abs: 5.8 10*3/uL (ref 1.7–7.7)
Neutrophils Relative %: 65 %
Platelets: 302 10*3/uL (ref 150–400)
RBC: 2.72 MIL/uL — ABNORMAL LOW (ref 3.87–5.11)
RDW: 15.5 % (ref 11.5–15.5)
WBC: 9 10*3/uL (ref 4.0–10.5)
nRBC: 1.4 % — ABNORMAL HIGH (ref 0.0–0.2)

## 2020-06-29 LAB — FERRITIN: Ferritin: 363 ng/mL — ABNORMAL HIGH (ref 11–307)

## 2020-06-29 LAB — BASIC METABOLIC PANEL
Anion gap: 10 (ref 5–15)
BUN: 33 mg/dL — ABNORMAL HIGH (ref 6–20)
CO2: 29 mmol/L (ref 22–32)
Calcium: 8.4 mg/dL — ABNORMAL LOW (ref 8.9–10.3)
Chloride: 104 mmol/L (ref 98–111)
Creatinine, Ser: 0.81 mg/dL (ref 0.44–1.00)
GFR, Estimated: >60 mL/min (ref >60)
Glucose, Bld: 201 mg/dL — ABNORMAL HIGH (ref 70–99)
Potassium: 3.5 mmol/L (ref 3.5–5.1)
Sodium: 143 mmol/L (ref 135–145)

## 2020-06-29 LAB — GLUCOSE, CAPILLARY
Glucose-Capillary: 250 mg/dL — ABNORMAL HIGH (ref 70–99)
Glucose-Capillary: 163 mg/dL — ABNORMAL HIGH (ref 70–99)
Glucose-Capillary: 289 mg/dL — ABNORMAL HIGH (ref 70–99)
Glucose-Capillary: 240 mg/dL — ABNORMAL HIGH (ref 70–99)
Glucose-Capillary: 154 mg/dL — ABNORMAL HIGH (ref 70–99)

## 2020-06-29 LAB — RPR: RPR Ser Ql: NONREACTIVE

## 2020-06-29 LAB — FIBRIN DERIVATIVES D-DIMER (ARMC ONLY): Fibrin derivatives D-dimer (ARMC): 1191.63 ng/mL (FEU) — ABNORMAL HIGH (ref 0.00–499.00)

## 2020-06-29 LAB — C-REACTIVE PROTEIN: CRP: 1.9 mg/dL — ABNORMAL HIGH (ref <1.0)

## 2020-06-29 LAB — MAGNESIUM: Magnesium: 2.1 mg/dL (ref 1.7–2.4)

## 2020-06-29 LAB — PHOSPHORUS: Phosphorus: 3.6 mg/dL (ref 2.5–4.6)

## 2020-06-29 MED ORDER — FREE WATER
200.0000 mL | Freq: Four times a day (QID) | Status: DC
  Administered 2020-06-29 – 2020-06-30 (×4): 200 mL

## 2020-06-29 MED ORDER — METOPROLOL TARTRATE 25 MG PO TABS
12.5000 mg | ORAL_TABLET | Freq: Two times a day (BID) | ORAL | Status: DC
  Administered 2020-06-29 – 2020-07-04 (×9): 12.5 mg via ORAL
  Filled 2020-06-29 (×9): qty 1

## 2020-06-29 MED ORDER — FREE WATER
200.0000 mL | Status: DC
  Administered 2020-06-29: 200 mL

## 2020-06-29 MED ORDER — FREE WATER: 200.0000 mL | Freq: Four times a day (QID) | Status: DC

## 2020-06-29 MED ORDER — LEVOTHYROXINE SODIUM 112 MCG PO TABS
112.0000 ug | ORAL_TABLET | Freq: Every day | ORAL | Status: DC
  Administered 2020-06-29 – 2020-06-30 (×2): 112 ug
  Filled 2020-06-29 (×2): qty 1

## 2020-06-29 NOTE — Progress Notes (Signed)
NAME:  Ashley [name], MRN:  [id number], DOB:  08/09/1965, LOS: 1 ADMISSION DATE:  06/20/2020, CONSULTATION DATE:  06/23/2020 REFERRING MD:  Dr. Priscella Mann, CHIEF COMPLAINT:  Altered Mental Status, COVID-19 Pneumonia  Brief History:  55 y.o. Female admitted with Acute Metabolic Encephalopathy in the setting of COVID-19 Pneumonia & DKA/HHS.  History of Present Illness:  55 year old female extensive past medical history as per medical record who presents for lethargy, subjective fevers at home, decreased level of consciousness. Found to have multifocal pneumonia in the setting of COVID-19 infection. Also with elevated procalcitonin concerning for bacterial pulm infection. Also presented with DKA/HHS. She has been on intravenous insulin infusion via Endo tool protocol. Anion gap closed. Glycemic control improved. Will transition off of IV insulin.   06/24/20- patient on MV critically ill.  Labwork this am with hypernatremia, aki, transaminitis, hypokalemia. CXR with bilateral air space opacification worse at RLL.   Weaning levophed.  I spoke with husband Tiaa Kiah and reviewed care plan with him, he will be coming in this afternoon to see patient.  06/25/20- patient failed SBT today. WUA required ativan due to aggitation.  06/26/20- patient remain on MV with complete weaning off levophed support this am.  Will attempt SBT today, had altered mentation yesterday.  Optimizing metabolic derrangements to improve SBT.  06/27/20 - Start precedex and wean off versed due to agitation/delirium    Past Medical History:   Active Ambulatory Problems    Diagnosis Date Noted  . DIAB W/O MENTION COMP TYPE I [JUV TYPE] UNCNTRL 02/28/2009  . GERD 02/28/2009  . Gastroparesis 03/22/2009  . DYSPHAGIA UNSPECIFIED 02/28/2009  . DYSPHAGIA 03/22/2009  . ABDOMINAL PAIN-EPIGASTRIC 02/28/2009  . Nausea with vomiting 10/22/2012  . Diarrhea 10/22/2012  . Dyspnea and respiratory abnormality 04/26/2015  . Chest  tightness 04/26/2015  . Hoarseness of voice 04/26/2015  . OSA and COPD overlap syndrome ([place]) 04/02/2018  . Excessive daytime sleepiness 04/02/2018  . Delayed sleep phase syndrome 04/02/2018  . Moderate persistent asthma 04/02/2018  . Loud snoring 05/19/2018  . OSA on CPAP 10/08/2018  . Essential hypertension 12/25/2018  . Hyperlipidemia 12/25/2018  . Peripheral arterial disease ([place]) 12/25/2018  . Claudication in peripheral vascular disease ([place]) 01/01/2019  . Tobacco abuse 02/24/2020  . Hypertensive disorder 12/09/2018   Resolved Ambulatory Problems    Diagnosis Date Noted  . No Resolved Ambulatory Problems   Past Medical History:  Diagnosis Date  . Anxiety   . Asthma   . Depression   . Diabetes mellitus ([place])   . Esophageal stricture   . GERD (gastroesophageal reflux disease)   . Glaucoma   . Graves disease 02/2010  . Helicobacter pylori infection   . Hypertension   . Hypothyroidism   . Insulin pump in place   . Nephrolithiasis 2010  . Neuropathy   . Osteoporosis   . Sleep apnea   . Sleep apnea with use of continuous positive airway pressure (CPAP)   . Vitamin D deficiency      Significant Hospital Events:  1/10: Presented to ED; Hospitalist [facility] to [facility] 1/13: PCCM consult  Consults:  Hospitalist (primary service) Neurology PCCM Diabetes Coordinator  Procedures:  N/A  Significant Diagnostic Tests:  1/11: CT Head w/o Contrast>>Mild chronic ischemic white matter disease. No acute intracranial abnormality seen. 1/11: CTA Chest>>1. Extensive motion artifact limits assessment without signs of central or lobar level pulmonary embolism. 2. Diffuse areas of consolidative change, nodularity and ground-glass. Findings show midlung and basilar predominance. Findings of multifocal  pneumonia in the setting of COVID-19 infection. 3. Mild mediastinal and hilar nodal enlargement likely reactive. 4. Aortic atherosclerosis. 1/11: MR Brain w/o contrast>>No acute  abnormality. Mild white matter changes similar to 2010. Mild atrophy with progression since 2010.  06/28/20 CT Head - No acute findings  Micro Data:  1/11: SARS-CoV-2 PCR>> Positive 1/11: Influenza A&B PCR>>negative  Antimicrobials:   Anti-infectives (From admission, onward)   Start     Dose/Rate Route Frequency Ordered Stop   06/23/20 1800  Ampicillin-Sulbactam (UNASYN) 3 g in sodium chloride 0.9 % 100 mL IVPB        3 g 200 mL/hr over 30 Minutes Intravenous Every 6 hours 06/23/20 1519 06/29/20 1200   06/22/20 2200  ceFEPIme (MAXIPIME) 2 g in sodium chloride 0.9 % 100 mL IVPB  Status:  Discontinued        2 g 200 mL/hr over 30 Minutes Intravenous Every 8 hours 06/22/20 1138 06/23/20 1519   06/22/20 2200  vancomycin (VANCOREADY) IVPB 750 mg/150 mL  Status:  Discontinued        750 mg 150 mL/hr over 60 Minutes Intravenous Every 12 hours 06/22/20 1138 06/23/20 1519   06/22/20 1000  remdesivir 100 mg in sodium chloride 0.9 % 100 mL IVPB  Status:  Discontinued       "Followed by" Linked Group Details   100 mg 200 mL/hr over 30 Minutes Intravenous Daily 06/21/20 0126 06/21/20 0630   06/22/20 1000  vancomycin (VANCOCIN) IVPB 1000 mg/200 mL premix  Status:  Discontinued        1,000 mg 200 mL/hr over 60 Minutes Intravenous Every 24 hours 06/21/20 0816 06/22/20 1139   06/21/20 1200  ceFEPIme (MAXIPIME) 2 g in sodium chloride 0.9 % 100 mL IVPB  Status:  Discontinued        2 g 200 mL/hr over 30 Minutes Intravenous Every 12 hours 06/21/20 0816 06/22/20 1139   06/21/20 1000  remdesivir 100 mg in sodium chloride 0.9 % 100 mL IVPB  Status:  Discontinued       "Followed by" Linked Group Details   100 mg 200 mL/hr over 30 Minutes Intravenous Daily 06/20/20 2359 06/21/20 0002   06/21/20 1000  remdesivir 100 mg in sodium chloride 0.9 % 100 mL IVPB       "Followed by" Linked Group Details   100 mg 200 mL/hr over 30 Minutes Intravenous Daily 06/21/20 0002 06/24/20 0948   06/21/20 1000   cefTRIAXone (ROCEPHIN) 2 g in sodium chloride 0.9 % 100 mL IVPB  Status:  Discontinued        2 g 200 mL/hr over 30 Minutes Intravenous Every 24 hours 06/21/20 0639 06/21/20 0723   06/21/20 1000  azithromycin (ZITHROMAX) 500 mg in sodium chloride 0.9 % 250 mL IVPB  Status:  Discontinued        500 mg 250 mL/hr over 60 Minutes Intravenous Every 24 hours 06/21/20 0639 06/21/20 0723   06/21/20 0945  vancomycin (VANCOREADY) IVPB 500 mg/100 mL        500 mg 100 mL/hr over 60 Minutes Intravenous  Once 06/21/20 0932 06/21/20 1126   06/21/20 0900  vancomycin (VANCOREADY) IVPB 1500 mg/300 mL  Status:  Discontinued        1,500 mg 150 mL/hr over 120 Minutes Intravenous  Once 06/21/20 0816 06/21/20 0932   06/21/20 0645  vancomycin (VANCOCIN) IVPB 1000 mg/200 mL premix        1,000 mg 200 mL/hr over 60 Minutes Intravenous  Once 06/21/20 3151  06/21/20 0834   06/21/20 0645  piperacillin-tazobactam (ZOSYN) IVPB 3.375 g        3.375 g 100 mL/hr over 30 Minutes Intravenous  Once 06/21/20 0632 06/21/20 0716   06/21/20 0645  cefTRIAXone (ROCEPHIN) 2 g in sodium chloride 0.9 % 100 mL IVPB  Status:  Discontinued        2 g 200 mL/hr over 30 Minutes Intravenous Every 24 hours 06/21/20 0634 06/21/20 0634   06/21/20 0645  azithromycin (ZITHROMAX) 500 mg in sodium chloride 0.9 % 250 mL IVPB  Status:  Discontinued        500 mg 250 mL/hr over 60 Minutes Intravenous Every 24 hours 06/21/20 0634 06/21/20 0634   06/21/20 0130  remdesivir 200 mg in sodium chloride 0.9% 250 mL IVPB  Status:  Discontinued       "Followed by" Linked Group Details   200 mg 580 mL/hr over 30 Minutes Intravenous Once 06/21/20 0126 06/21/20 0630   06/21/20 0015  remdesivir 200 mg in sodium chloride 0.9% 250 mL IVPB       "Followed by" Linked Group Details   200 mg 580 mL/hr over 30 Minutes Intravenous Once 06/21/20 0002 06/21/20 0251   06/21/20 0000  remdesivir 200 mg in sodium chloride 0.9% 250 mL IVPB  Status:  Discontinued        "Followed by" Linked Group Details   200 mg 580 mL/hr over 30 Minutes Intravenous Once 06/20/20 2359 06/21/20 0002        Objective   Blood pressure (!) 99/50, pulse 67, temperature 98.06 F (36.7 C), resp. rate 16, height 4\' 11"  (1.499 m), weight 66.1 kg, SpO2 100 %.    Vent Mode: PRVC FiO2 (%):  [35 %] 35 % Set Rate:  [16 bmp] 16 bmp Vt Set:  [480 mL] 480 mL PEEP:  [5 cmH20] 5 cmH20   Intake/Output Summary (Last 24 hours) at 06/29/2020 0727 Last data filed at 06/29/2020 0600 Gross per 24 hour  Intake 1844.9 ml  Output 2125 ml  Net -280.1 ml   Filed Weights   06/25/20 0500 06/26/20 0500 06/28/20 0500  Weight: 63.4 kg 63 kg 66.1 kg    PHYSICAL EXAMINATION:  GENERAL: intubated and sedated.  HEAD: Normocephalic, atraumatic.  EYES: PERRL,  No scleral icterus. Scleral edema MOUTH: Moist mucosal membrane. NECK: Supple. No JVD. PULMONARY: course breath sounds. No wheezing. CARDIOVASCULAR: S1S2. Tachycardic. No murmurs, rubs, or gallops.  GASTROINTESTINAL: Soft, nontender, -distended. Positive bowel sounds.  MUSCULOSKELETAL: No swelling, clubbing, or edema.  NEUROLOGIC: awake, No purposeful movements.  SKIN: intact, no rashes  Labs   CBC: Recent Labs  Lab 06/24/20 0334 06/25/20 0614 06/26/20 0524 06/27/20 0312 06/28/20 0606 06/29/20 0516  WBC 17.9* 14.6* 10.8* 10.5 8.5 9.0  NEUTROABS 15.1* 12.1* 8.8*  --  5.3 5.8  HGB 11.2* 9.7* 9.6* 9.3* 9.0* 8.4*  HCT 33.7* 30.1* 30.6* 29.0* 27.9* 26.1*  MCV 93.1 95.3 97.5 97.6 95.9 96.0  PLT 321 339 336 354 328 [id number]    Basic Metabolic Panel: Recent Labs  Lab 06/25/20 0614 06/26/20 0524 06/27/20 0312 06/27/20 1511 06/28/20 0606 06/29/20 0516  NA 152* 151* 152* 148* 142 143  K 4.0 4.2 4.3 3.5 2.9* 3.5  CL 113* 111 113* 104 102 104  CO2 27 28 31 28 30 29   GLUCOSE 195* 245* 261* 232* 151* 201*  BUN 36* 36* 43* 44* 35* 33*  CREATININE 0.93 0.84 0.92 0.93 0.82 0.81  CALCIUM 8.1* 8.4* 8.2* 8.6* 8.2* 8.4*  MG 2.4 2.5*  2.7*  --  2.1 2.1  PHOS 3.7 4.4 4.5  --  2.8 3.6   GFR: Estimated Creatinine Clearance: 65.7 mL/min (by C-G formula based on SCr of 0.81 mg/dL). Recent Labs  Lab 06/22/20 0833 06/23/20 0222 06/24/20 0334 06/25/20 1884 06/26/20 0524 06/27/20 0312 06/28/20 0606 06/29/20 0516  PROCALCITON  --   --  0.70 0.13 <0.10  --   --   --   WBC  --    < > 17.9* 14.6* 10.8* 10.5 8.5 9.0  LATICACIDVEN 2.3*  --   --   --   --   --   --   --    < > = values in this interval not displayed.    Liver Function Tests: Recent Labs  Lab 06/23/20 0222 06/24/20 0334 06/25/20 0614 06/26/20 0524  AST 57* 42* 24 24  ALT 35 26 21 18   ALKPHOS 65 76 74 76  BILITOT 1.1 0.7 0.5 0.7  PROT 6.8 5.9* 5.3* 5.5*  ALBUMIN 3.3* 2.9* 2.5* 2.3*   No results for input(s): LIPASE, AMYLASE in the last 168 hours. Recent Labs  Lab 06/23/20 0832 06/28/20 1534  AMMONIA 24 20    ABG    Component Value Date/Time   PHART 7.45 06/23/2020 2100   PCO2ART 45 06/23/2020 2100   PO2ART 243 (H) 06/23/2020 2100   HCO3 31.3 (H) 06/23/2020 2100   TCO2 32 05/05/2020 0200   ACIDBASEDEF 6.4 (H) 06/21/2020 0120   O2SAT 99.9 06/23/2020 2100     Coagulation Profile: No results for input(s): INR, PROTIME in the last 168 hours.  Cardiac Enzymes: No results for input(s): CKTOTAL, CKMB, CKMBINDEX, TROPONINI in the last 168 hours.  HbA1C: Hgb A1c MFr Bld  Date/Time Value Ref Range Status  06/21/2020 03:23 AM 8.2 (H) 4.8 - 5.6 % Final    Comment:    (NOTE) Pre diabetes:          5.7%-6.4%  Diabetes:              >6.4%  Glycemic control for   <7.0% adults with diabetes     CBG: Recent Labs  Lab 06/28/20 1149 06/28/20 1702 06/28/20 1924 06/28/20 2316 06/29/20 0319  GLUCAP 164* 237* 209* 175* 250*   CXR 06/27/20 reviewed The endotracheal tube terminates above the carina. The enteric tube extends below the left hemidiaphragm. There are persistent bilateral airspace opacities, not substantially changed from  prior study. The right-sided PICC line is unchanged there is no pneumothorax. There are probable small bilateral pleural effusions. The esophageal temperature probe terminates in the mid esophagus level. Resolved Hospital Problem list      Assessment & Plan:  55 yo obese white female with acute and severe resp failure with acute and severe ARDS with b/l pneumonia due to COVID pneumonia with acute COPD exacerbation  Severe COVID-19 infection, ARDS and pneumonia/pneumonitis - Remdesevir antiviral - 1/10 to 1/15 - Methylprednisolone 40mg  BID 1/12 - 1/17, 40mg  daily 1/18>> - Has been on unasyn for aspiration coverage, 7 day course, end date 1/19 - Neuro status is main barrier to extubation at this time - Wean sedation for SBT - VAP bundle  Encephalopathy In setting of sepsis, hypoxemic respiratory failure and metabolic derangements - MRI brain with no acute abnormality 06/21/20 - CT Head 1/18 no acute findigns - Will try to wean off versed today and transition to precedex - Wean other sedatives as able - Start seroquel 50mg  qHS and 25mg  qAM  Shock - resolved - stop midodrine today  Tachycardia and Hypertension - start metoprolol 12.5mg  BID  Hypernatremia -  Free water flushes at 266mL q6hr   DVT/GI PRX ordered and assessed TRANSFUSIONS AS NEEDED MONITOR FSBS I Assessed the need for Labs I Assessed the need for Foley I Assessed the need for Central Venous Line Family Discussion when available I Assessed the need for Mobilization I made an Assessment of medications to be adjusted accordingly Safety Risk assessment completed  CASE DISCUSSED IN MULTIDISCIPLINARY ROUNDS WITH ICU TEAM    Critical Care Time devoted to patient care services described in this note is 35  minutes.    Freda Jackson, MD [facility] Office: [phone]   See Amion for Pager Details

## 2020-06-29 NOTE — Progress Notes (Signed)
Inpatient Diabetes Program Recommendations  AACE/ADA: New Consensus Statement on Inpatient Glycemic Control (2015)  Target Ranges:  Prepandial:   less than 140 mg/dL      Peak postprandial:   less than 180 mg/dL (1-2 hours)      Critically ill patients:  140 - 180 mg/dL   Lab Results  Component Value Date   GLUCAP 163 (H) 06/29/2020   HGBA1C 8.2 (H) 06/21/2020    Review of Glycemic ControlResults for ARIADNE, RISSMILLER (MRN [id number]) as of 06/29/2020 09:04  Ref. Range 06/28/2020 23:16 06/29/2020 03:19 06/29/2020 08:07  Glucose-Capillary Latest Ref Range: 70 - 99 mg/dL 175 (H) 250 (H) 163 (H)   Diabetes history:DM1 (makes NO insulin; requires basal, correction, and carb coverage insulin) Outpatient Diabetes medications:Insulin Pump with Novolog Current orders for Inpatient glycemic control:Lantus 30 units Q24H, Novolog 0-15 units Q4H; Solumedrol 40 mg q 24 hours, Novolog 3 units q 4 hours  Inpatient Diabetes Program Recommendations:    Consider increasing Lantus to 35 units q HS and increase Novolog tube feed coverage to 4 units q 4 hours.    Thanks,  Adah Perl, RN, BC-ADM Inpatient Diabetes Coordinator Pager [phone] (8a-5p)

## 2020-06-29 NOTE — Progress Notes (Signed)
PHARMACY CONSULT NOTE - FOLLOW UP  Pharmacy Consult for Electrolyte Monitoring and Replacement   Recent Labs: Potassium (mmol/L)  Date Value  06/29/2020 3.5   Magnesium (mg/dL)  Date Value  06/29/2020 2.1   Calcium (mg/dL)  Date Value  06/29/2020 8.4 (L)   Albumin (g/dL)  Date Value  06/26/2020 2.3 (L)  01/09/2019 4.5   Phosphorus (mg/dL)  Date Value  06/29/2020 3.6   Sodium (mmol/L)  Date Value  06/29/2020 143  01/30/2019 142     Assessment: 55 year old female admitted with metabolic encephalopathy in setting of Covid and DKA/HHS. Patient required intubation 1/13. She has been on and off of the insulin drip. Currently remains off drip on subcu regimen. Pharmacy to manage electrolytes.   Goal of Therapy:  Electrolytes WNL  Plan:  Electrolytes stable. Patient tolerating tube feeds. Follow up with morning labs.  Dorena Bodo, PharmD Clinical Pharmacist 06/29/2020

## 2020-06-30 LAB — CBC WITH DIFFERENTIAL/PLATELET
Abs Immature Granulocytes: 0.41 10*3/uL — ABNORMAL HIGH (ref 0.00–0.07)
Basophils Absolute: 0 10*3/uL (ref 0.0–0.1)
Basophils Relative: 0 %
Eosinophils Absolute: 0.1 10*3/uL (ref 0.0–0.5)
Eosinophils Relative: 1 %
HCT: 27.8 % — ABNORMAL LOW (ref 36.0–46.0)
Hemoglobin: 8.9 g/dL — ABNORMAL LOW (ref 12.0–15.0)
Immature Granulocytes: 4 %
Lymphocytes Relative: 24 %
Lymphs Abs: 2.5 10*3/uL (ref 0.7–4.0)
MCH: 31.1 pg (ref 26.0–34.0)
MCHC: 32 g/dL (ref 30.0–36.0)
MCV: 97.2 fL (ref 80.0–100.0)
Monocytes Absolute: 1 10*3/uL (ref 0.1–1.0)
Monocytes Relative: 9 %
Neutro Abs: 6.4 10*3/uL (ref 1.7–7.7)
Neutrophils Relative %: 62 %
Platelets: 342 10*3/uL (ref 150–400)
RBC: 2.86 MIL/uL — ABNORMAL LOW (ref 3.87–5.11)
RDW: 15.8 % — ABNORMAL HIGH (ref 11.5–15.5)
WBC: 10.3 10*3/uL (ref 4.0–10.5)
nRBC: 1 % — ABNORMAL HIGH (ref 0.0–0.2)

## 2020-06-30 LAB — MAGNESIUM: Magnesium: 1.9 mg/dL (ref 1.7–2.4)

## 2020-06-30 LAB — C-REACTIVE PROTEIN: CRP: 2.1 mg/dL — ABNORMAL HIGH (ref <1.0)

## 2020-06-30 LAB — BASIC METABOLIC PANEL
Anion gap: 10 (ref 5–15)
BUN: 23 mg/dL — ABNORMAL HIGH (ref 6–20)
CO2: 32 mmol/L (ref 22–32)
Calcium: 8.6 mg/dL — ABNORMAL LOW (ref 8.9–10.3)
Chloride: 103 mmol/L (ref 98–111)
Creatinine, Ser: 0.55 mg/dL (ref 0.44–1.00)
GFR, Estimated: >60 mL/min (ref >60)
Glucose, Bld: 100 mg/dL — ABNORMAL HIGH (ref 70–99)
Potassium: 3.1 mmol/L — ABNORMAL LOW (ref 3.5–5.1)
Sodium: 145 mmol/L (ref 135–145)

## 2020-06-30 LAB — GLUCOSE, CAPILLARY
Glucose-Capillary: 109 mg/dL — ABNORMAL HIGH (ref 70–99)
Glucose-Capillary: 127 mg/dL — ABNORMAL HIGH (ref 70–99)
Glucose-Capillary: 128 mg/dL — ABNORMAL HIGH (ref 70–99)
Glucose-Capillary: 186 mg/dL — ABNORMAL HIGH (ref 70–99)
Glucose-Capillary: 197 mg/dL — ABNORMAL HIGH (ref 70–99)
Glucose-Capillary: 228 mg/dL — ABNORMAL HIGH (ref 70–99)
Glucose-Capillary: 94 mg/dL (ref 70–99)

## 2020-06-30 LAB — FERRITIN: Ferritin: 380 ng/mL — ABNORMAL HIGH (ref 11–307)

## 2020-06-30 LAB — PHOSPHORUS: Phosphorus: 2.9 mg/dL (ref 2.5–4.6)

## 2020-06-30 LAB — D-DIMER, QUANTITATIVE: D-Dimer, Quant: 1.11 ug/mL-FEU — ABNORMAL HIGH (ref 0.00–0.50)

## 2020-06-30 LAB — METHYLMALONIC ACID, SERUM: Methylmalonic Acid, Quantitative: 606 nmol/L — ABNORMAL HIGH (ref 0–378)

## 2020-06-30 MED ORDER — ADULT MULTIVITAMIN W/MINERALS CH
1.0000 | ORAL_TABLET | Freq: Every day | ORAL | Status: DC
  Administered 2020-07-02 – 2020-07-06 (×5): 1 via ORAL
  Filled 2020-06-30 (×5): qty 1

## 2020-06-30 MED ORDER — DOCUSATE SODIUM 50 MG/5ML PO LIQD
100.0000 mg | Freq: Two times a day (BID) | ORAL | Status: DC
  Administered 2020-07-01 – 2020-07-06 (×8): 100 mg via ORAL
  Filled 2020-06-30 (×11): qty 10

## 2020-06-30 MED ORDER — POTASSIUM CHLORIDE 10 MEQ/100ML IV SOLN
10.0000 meq | INTRAVENOUS | Status: AC
  Administered 2020-06-30 (×2): 10 meq via INTRAVENOUS
  Filled 2020-06-30 (×2): qty 100

## 2020-06-30 MED ORDER — CHLORHEXIDINE GLUCONATE 0.12 % MT SOLN
15.0000 mL | Freq: Two times a day (BID) | OROMUCOSAL | Status: DC
  Administered 2020-07-01 – 2020-07-03 (×5): 15 mL via OROMUCOSAL
  Filled 2020-06-30 (×4): qty 15

## 2020-06-30 MED ORDER — POTASSIUM CHLORIDE 20 MEQ PO PACK
40.0000 meq | PACK | ORAL | Status: AC
  Administered 2020-06-30: 40 meq
  Filled 2020-06-30: qty 2

## 2020-06-30 MED ORDER — QUETIAPINE FUMARATE 25 MG PO TABS
50.0000 mg | ORAL_TABLET | Freq: Every day | ORAL | Status: DC
  Administered 2020-07-01 – 2020-07-05 (×5): 50 mg via ORAL
  Filled 2020-06-30 (×5): qty 2

## 2020-06-30 MED ORDER — ORAL CARE MOUTH RINSE
15.0000 mL | Freq: Two times a day (BID) | OROMUCOSAL | Status: DC
  Administered 2020-07-01 – 2020-07-02 (×4): 15 mL via OROMUCOSAL

## 2020-06-30 MED ORDER — ACETAMINOPHEN 325 MG PO TABS: 650.0000 mg | ORAL_TABLET | Freq: Four times a day (QID) | ORAL | Status: DC | PRN

## 2020-06-30 MED ORDER — LEVOTHYROXINE SODIUM 112 MCG PO TABS
112.0000 ug | ORAL_TABLET | Freq: Every day | ORAL | Status: DC
  Administered 2020-07-02 – 2020-07-05 (×4): 112 ug via ORAL
  Filled 2020-06-30 (×6): qty 1

## 2020-06-30 MED ORDER — MAGNESIUM SULFATE 2 GM/50ML IV SOLN
2.0000 g | Freq: Once | INTRAVENOUS | Status: AC
  Administered 2020-06-30: 2 g via INTRAVENOUS
  Filled 2020-06-30: qty 50

## 2020-06-30 NOTE — Progress Notes (Signed)
Pt was suctioned prior to extubation for a small amount of white secretions. Per Dr. August Albino order, she was extubated and placed on a 4L nasal cannula. The pt is voicing and is without stridor.

## 2020-06-30 NOTE — Progress Notes (Addendum)
PHARMACY CONSULT NOTE - FOLLOW UP  Pharmacy Consult for Electrolyte Monitoring and Replacement   Recent Labs: Potassium (mmol/L)  Date Value  06/30/2020 3.1 (L)   Magnesium (mg/dL)  Date Value  06/30/2020 1.9   Calcium (mg/dL)  Date Value  06/30/2020 8.6 (L)   Albumin (g/dL)  Date Value  06/26/2020 2.3 (L)  01/09/2019 4.5   Phosphorus (mg/dL)  Date Value  06/30/2020 2.9   Sodium (mmol/L)  Date Value  06/30/2020 145  01/30/2019 142     Assessment: 55 year old female admitted with metabolic encephalopathy in setting of Covid and DKA/HHS. Patient required intubation 1/13. She has been on and off of the insulin drip. Currently remains off drip on subcu regimen. Pharmacy to manage electrolytes.   Goal of Therapy:  Electrolytes WNL  Plan:  Potassium 40 mEq per tube x 2 doses + mag 2 g IV x 1. Follow up with morning labs.  Patient extubated prior to second dose of potassium. Will order potassium 10 mEq IV x 2. Patient to have swallow evaluation in the morning. Hope patient will be able to tolerate starting a diet.  Dorena Bodo, PharmD Clinical Pharmacist 06/30/2020

## 2020-06-30 NOTE — Progress Notes (Signed)
NAME:  Ashley [name], MRN:  [id number], DOB:  [birth year], LOS: 84 ADMISSION DATE:  06/20/2020, CONSULTATION DATE:  06/23/2020 REFERRING MD:  Dr. Priscella Mann, CHIEF COMPLAINT:  Altered Mental Status, COVID-19 Pneumonia  Brief History:  55 y.o. Female admitted with Acute Metabolic Encephalopathy in the setting of COVID-19 Pneumonia & DKA/HHS.  History of Present Illness:  55 year old female extensive past medical history as per medical record who presents for lethargy, subjective fevers at home, decreased level of consciousness. Found to have multifocal pneumonia in the setting of COVID-19 infection. Also with elevated procalcitonin concerning for bacterial pulm infection. Also presented with DKA/HHS. She has been on intravenous insulin infusion via Endo tool protocol. Anion gap closed. Glycemic control improved. Will transition off of IV insulin.   06/24/20- patient on MV critically ill.  Labwork this am with hypernatremia, aki, transaminitis, hypokalemia. CXR with bilateral air space opacification worse at RLL.   Weaning levophed.  I spoke with husband Yaslene Lindamood and reviewed care plan with him, he will be coming in this afternoon to see patient.  06/25/20- patient failed SBT today. WUA required ativan due to aggitation.  06/26/20- patient remain on MV with complete weaning off levophed support this am.  Will attempt SBT today, had altered mentation yesterday.  Optimizing metabolic derrangements to improve SBT.  06/27/20 - Start precedex and wean off versed due to agitation/delirium 06/29/20 - Patient on propofol and fentanyl, weaning sedation  06/30/20 - no issues overnight, patient more alert this morning.   Past Medical History:   Active Ambulatory Problems    Diagnosis Date Noted  . DIAB W/O MENTION COMP TYPE I [JUV TYPE] UNCNTRL 02/28/2009  . GERD 02/28/2009  . Gastroparesis 03/22/2009  . DYSPHAGIA UNSPECIFIED 02/28/2009  . DYSPHAGIA 03/22/2009  . ABDOMINAL PAIN-EPIGASTRIC  02/28/2009  . Nausea with vomiting 10/22/2012  . Diarrhea 10/22/2012  . Dyspnea and respiratory abnormality 04/26/2015  . Chest tightness 04/26/2015  . Hoarseness of voice 04/26/2015  . OSA and COPD overlap syndrome ([place]) 04/02/2018  . Excessive daytime sleepiness 04/02/2018  . Delayed sleep phase syndrome 04/02/2018  . Moderate persistent asthma 04/02/2018  . Loud snoring 05/19/2018  . OSA on CPAP 10/08/2018  . Essential hypertension 12/25/2018  . Hyperlipidemia 12/25/2018  . Peripheral arterial disease ([place]) 12/25/2018  . Claudication in peripheral vascular disease ([place]) 01/01/2019  . Tobacco abuse 02/24/2020  . Hypertensive disorder 12/09/2018   Resolved Ambulatory Problems    Diagnosis Date Noted  . No Resolved Ambulatory Problems   Past Medical History:  Diagnosis Date  . Anxiety   . Asthma   . Depression   . Diabetes mellitus ([place])   . Esophageal stricture   . GERD (gastroesophageal reflux disease)   . Glaucoma   . Graves disease 02/2010  . Helicobacter pylori infection   . Hypertension   . Hypothyroidism   . Insulin pump in place   . Nephrolithiasis 2010  . Neuropathy   . Osteoporosis   . Sleep apnea   . Sleep apnea with use of continuous positive airway pressure (CPAP)   . Vitamin D deficiency      Significant Hospital Events:  1/10: Presented to ED; Hospitalist admit to [facility] 1/13: PCCM consult  Consults:  Hospitalist (primary service) Neurology PCCM Diabetes Coordinator  Procedures:  N/A  Significant Diagnostic Tests:  1/11: CT Head w/o Contrast>>Mild chronic ischemic white matter disease. No acute intracranial abnormality seen. 1/11: CTA Chest>>1. Extensive motion artifact limits assessment without signs of central or lobar level pulmonary  embolism. 2. Diffuse areas of consolidative change, nodularity and ground-glass. Findings show midlung and basilar predominance. Findings of multifocal pneumonia in the setting of  COVID-19 infection. 3. Mild mediastinal and hilar nodal enlargement likely reactive. 4. Aortic atherosclerosis. 1/11: MR Brain w/o contrast>>No acute abnormality. Mild white matter changes similar to 2010. Mild atrophy with progression since 2010.  06/28/20 CT Head - No acute findings  Micro Data:  1/11: SARS-CoV-2 PCR>> Positive 1/11: Influenza A&B PCR>>negative  Antimicrobials:   Anti-infectives (From admission, onward)   Start     Dose/Rate Route Frequency Ordered Stop   06/23/20 1800  Ampicillin-Sulbactam (UNASYN) 3 g in sodium chloride 0.9 % 100 mL IVPB        3 g 200 mL/hr over 30 Minutes Intravenous Every 6 hours 06/23/20 1519 06/29/20 1258   06/22/20 2200  ceFEPIme (MAXIPIME) 2 g in sodium chloride 0.9 % 100 mL IVPB  Status:  Discontinued        2 g 200 mL/hr over 30 Minutes Intravenous Every 8 hours 06/22/20 1138 06/23/20 1519   06/22/20 2200  vancomycin (VANCOREADY) IVPB 750 mg/150 mL  Status:  Discontinued        750 mg 150 mL/hr over 60 Minutes Intravenous Every 12 hours 06/22/20 1138 06/23/20 1519   06/22/20 1000  remdesivir 100 mg in sodium chloride 0.9 % 100 mL IVPB  Status:  Discontinued       "Followed by" Linked Group Details   100 mg 200 mL/hr over 30 Minutes Intravenous Daily 06/21/20 0126 06/21/20 0630   06/22/20 1000  vancomycin (VANCOCIN) IVPB 1000 mg/200 mL premix  Status:  Discontinued        1,000 mg 200 mL/hr over 60 Minutes Intravenous Every 24 hours 06/21/20 0816 06/22/20 1139   06/21/20 1200  ceFEPIme (MAXIPIME) 2 g in sodium chloride 0.9 % 100 mL IVPB  Status:  Discontinued        2 g 200 mL/hr over 30 Minutes Intravenous Every 12 hours 06/21/20 0816 06/22/20 1139   06/21/20 1000  remdesivir 100 mg in sodium chloride 0.9 % 100 mL IVPB  Status:  Discontinued       "Followed by" Linked Group Details   100 mg 200 mL/hr over 30 Minutes Intravenous Daily 06/20/20 2359 06/21/20 0002   06/21/20 1000  remdesivir 100 mg in sodium chloride 0.9 % 100 mL IVPB        "Followed by" Linked Group Details   100 mg 200 mL/hr over 30 Minutes Intravenous Daily 06/21/20 0002 06/24/20 0948   06/21/20 1000  cefTRIAXone (ROCEPHIN) 2 g in sodium chloride 0.9 % 100 mL IVPB  Status:  Discontinued        2 g 200 mL/hr over 30 Minutes Intravenous Every 24 hours 06/21/20 0639 06/21/20 0723   06/21/20 1000  azithromycin (ZITHROMAX) 500 mg in sodium chloride 0.9 % 250 mL IVPB  Status:  Discontinued        500 mg 250 mL/hr over 60 Minutes Intravenous Every 24 hours 06/21/20 0639 06/21/20 0723   06/21/20 0945  vancomycin (VANCOREADY) IVPB 500 mg/100 mL        500 mg 100 mL/hr over 60 Minutes Intravenous  Once 06/21/20 0932 06/21/20 1126   06/21/20 0900  vancomycin (VANCOREADY) IVPB 1500 mg/300 mL  Status:  Discontinued        1,500 mg 150 mL/hr over 120 Minutes Intravenous  Once 06/21/20 0816 06/21/20 0932   06/21/20 0645  vancomycin (VANCOCIN) IVPB 1000 mg/200 mL premix  1,000 mg 200 mL/hr over 60 Minutes Intravenous  Once 06/21/20 [id number] 06/21/20 0834   06/21/20 0645  piperacillin-tazobactam (ZOSYN) IVPB 3.375 g        3.375 g 100 mL/hr over 30 Minutes Intravenous  Once 06/21/20 [id number] 06/21/20 0716   06/21/20 0645  cefTRIAXone (ROCEPHIN) 2 g in sodium chloride 0.9 % 100 mL IVPB  Status:  Discontinued        2 g 200 mL/hr over 30 Minutes Intravenous Every 24 hours 06/21/20 0634 06/21/20 0634   06/21/20 0645  azithromycin (ZITHROMAX) 500 mg in sodium chloride 0.9 % 250 mL IVPB  Status:  Discontinued        500 mg 250 mL/hr over 60 Minutes Intravenous Every 24 hours 06/21/20 0634 06/21/20 0634   06/21/20 0130  remdesivir 200 mg in sodium chloride 0.9% 250 mL IVPB  Status:  Discontinued       "Followed by" Linked Group Details   200 mg 580 mL/hr over 30 Minutes Intravenous Once 06/21/20 0126 06/21/20 0630   06/21/20 0015  remdesivir 200 mg in sodium chloride 0.9% 250 mL IVPB       "Followed by" Linked Group Details   200 mg 580 mL/hr over 30 Minutes  Intravenous Once 06/21/20 0002 06/21/20 0251   06/21/20 0000  remdesivir 200 mg in sodium chloride 0.9% 250 mL IVPB  Status:  Discontinued       "Followed by" Linked Group Details   200 mg 580 mL/hr over 30 Minutes Intravenous Once 06/20/20 2359 06/21/20 0002        Objective   Blood pressure (!) 144/79, pulse 100, temperature 99.14 F (37.3 C), resp. rate 20, height 4\' 11"  (1.499 m), weight 64.3 kg, SpO2 100 %.    Vent Mode: PRVC FiO2 (%):  [28 %] 28 % Set Rate:  [16 bmp] 16 bmp Vt Set:  [480 mL] 480 mL PEEP:  [5 cmH20] 5 cmH20 Pressure Support:  [10 cmH20] 10 cmH20 Plateau Pressure:  [19 cmH20] 19 cmH20   Intake/Output Summary (Last 24 hours) at 06/30/2020 0718 Last data filed at 06/30/2020 0600 Gross per 24 hour  Intake 1836.93 ml  Output 3830 ml  Net -1993.07 ml   Filed Weights   06/26/20 0500 06/28/20 0500 06/30/20 0431  Weight: 63 kg 66.1 kg 64.3 kg    PHYSICAL EXAMINATION:  GENERAL: intubated, alert HEAD: Normocephalic, atraumatic.  EYES: PERRL,  No scleral icterus. Mild scleral edema MOUTH: Moist mucosal membrane. NECK: Supple. No JVD. PULMONARY: course breath sounds on right. No wheezing. CARDIOVASCULAR: S1S2. Tachycardic. No murmurs, rubs, or gallops.  GASTROINTESTINAL: Soft, nontender, non-distended. Positive bowel sounds.  MUSCULOSKELETAL: No swelling, clubbing, or edema.  NEUROLOGIC: awake, following some but not all commands.   SKIN: intact, no rashes  Labs   CBC: Recent Labs  Lab 06/25/20 0614 06/26/20 0524 06/27/20 0312 06/28/20 0606 06/29/20 0516 06/30/20 0602  WBC 14.6* 10.8* 10.5 8.5 9.0 10.3  NEUTROABS 12.1* 8.8*  --  5.3 5.8 6.4  HGB 9.7* 9.6* 9.3* 9.0* 8.4* 8.9*  HCT 30.1* 30.6* 29.0* 27.9* 26.1* 27.8*  MCV 95.3 97.5 97.6 95.9 96.0 97.2  PLT 339 336 354 328 302 [id number]    Basic Metabolic Panel: Recent Labs  Lab 06/25/20 0614 06/26/20 0524 06/27/20 0312 06/27/20 1511 06/28/20 0606 06/29/20 0516  NA 152* 151* 152* 148* 142 143   K 4.0 4.2 4.3 3.5 2.9* 3.5  CL 113* 111 113* 104 102 104  CO2 27 28 31 28 30  29  GLUCOSE 195* 245* 261* 232* 151* 201*  BUN 36* 36* 43* 44* 35* 33*  CREATININE 0.93 0.84 0.92 0.93 0.82 0.81  CALCIUM 8.1* 8.4* 8.2* 8.6* 8.2* 8.4*  MG 2.4 2.5* 2.7*  --  2.1 2.1  PHOS 3.7 4.4 4.5  --  2.8 3.6   GFR: Estimated Creatinine Clearance: 64.7 mL/min (by C-G formula based on SCr of 0.81 mg/dL). Recent Labs  Lab 06/24/20 0334 06/25/20 0614 06/26/20 0524 06/27/20 0312 06/28/20 0606 06/29/20 0516 06/30/20 0602  PROCALCITON 0.70 0.13 <0.10  --   --   --   --   WBC 17.9* 14.6* 10.8* 10.5 8.5 9.0 10.3    Liver Function Tests: Recent Labs  Lab 06/24/20 0334 06/25/20 0614 06/26/20 0524  AST 42* 24 24  ALT 26 21 18   ALKPHOS 76 74 76  BILITOT 0.7 0.5 0.7  PROT 5.9* 5.3* 5.5*  ALBUMIN 2.9* 2.5* 2.3*   No results for input(s): LIPASE, AMYLASE in the last 168 hours. Recent Labs  Lab 06/23/20 0832 06/28/20 1534  AMMONIA 24 20    ABG    Component Value Date/Time   PHART 7.45 06/23/2020 2100   PCO2ART 45 06/23/2020 2100   PO2ART 243 (H) 06/23/2020 2100   HCO3 31.3 (H) 06/23/2020 2100   TCO2 32 05/05/2020 0200   ACIDBASEDEF 6.4 (H) 06/21/2020 0120   O2SAT 99.9 06/23/2020 2100     Coagulation Profile: No results for input(s): INR, PROTIME in the last 168 hours.  Cardiac Enzymes: No results for input(s): CKTOTAL, CKMB, CKMBINDEX, TROPONINI in the last 168 hours.  HbA1C: Hgb A1c MFr Bld  Date/Time Value Ref Range Status  06/21/2020 03:23 AM 8.2 (H) 4.8 - 5.6 % Final    Comment:    (NOTE) Pre diabetes:          5.7%-6.4%  Diabetes:              >6.4%  Glycemic control for   <7.0% adults with diabetes     CBG: Recent Labs  Lab 06/29/20 1217 06/29/20 1617 06/29/20 1950 06/30/20 0032 06/30/20 0419  GLUCAP 289* 240* 154* 197* 109*   CXR 06/27/20 reviewed The endotracheal tube terminates above the carina. The enteric tube extends below the left hemidiaphragm.  There are persistent bilateral airspace opacities, not substantially changed from prior study. The right-sided PICC line is unchanged there is no pneumothorax. There are probable small bilateral pleural effusions. The esophageal temperature probe terminates in the mid esophagus level.  Resolved Hospital Problem list   Shock   Assessment & Plan:  55 yo obese white female with acute and severe resp failure with acute and severe ARDS with b/l pneumonia due to COVID pneumonia with acute COPD exacerbation  Severe COVID-19 infection, ARDS and pneumonia/pneumonitis - Remdesevir antiviral - 1/10 to 1/15 - Methylprednisolone 40mg  BID 1/12 - 1/17, 40mg  daily 1/18 with an end date of 1/21 - Completed 7 day course of unasyn for aspiration coverage, end date 1/19 - Neuro status is main barrier to extubation at this time, but is improved this AM - Stop sedation for SBT - VAP bundle  Encephalopathy In setting of sepsis, hypoxemic respiratory failure and metabolic derangements - MRI brain with no acute abnormality 06/21/20 - CT Head 1/18 no acute findigns - Seroquel 50mg  qHS and 25mg  qAM - Improved today, will hold AM seroquel and all sedation   Tachycardia and Hypertension - Metoprolol 12.5mg  BID  Hypernatremia -  Free water flushes at 244mL q6hr  Hx of  Hypothyroidism - synthroid restarted  DVT/GI PRX ordered and assessed TRANSFUSIONS AS NEEDED MONITOR FSBS I Assessed the need for Labs I Assessed the need for Foley I Assessed the need for Central Venous Line Family Discussion when available I Assessed the need for Mobilization I made an Assessment of medications to be adjusted accordingly Safety Risk assessment completed  CASE DISCUSSED IN MULTIDISCIPLINARY ROUNDS WITH ICU TEAM    Critical Care Time devoted to patient care services described in this note is 35  minutes.    Freda Jackson, MD [facility] Office: [phone]   See Amion for Pager  Details

## 2020-07-01 DIAGNOSIS — J1282 Pneumonia due to coronavirus disease 2019: Secondary | ICD-10-CM | POA: Diagnosis not present

## 2020-07-01 DIAGNOSIS — G9349 Other encephalopathy: Secondary | ICD-10-CM | POA: Diagnosis not present

## 2020-07-01 DIAGNOSIS — U071 COVID-19: Secondary | ICD-10-CM | POA: Diagnosis not present

## 2020-07-01 LAB — CBC WITH DIFFERENTIAL/PLATELET
Abs Immature Granulocytes: 0.32 10*3/uL — ABNORMAL HIGH (ref 0.00–0.07)
Basophils Absolute: 0 10*3/uL (ref 0.0–0.1)
Basophils Relative: 0 %
Eosinophils Absolute: 0.1 10*3/uL (ref 0.0–0.5)
Eosinophils Relative: 1 %
HCT: 33 % — ABNORMAL LOW (ref 36.0–46.0)
Hemoglobin: 10.4 g/dL — ABNORMAL LOW (ref 12.0–15.0)
Immature Granulocytes: 3 %
Lymphocytes Relative: 16 %
Lymphs Abs: 1.9 10*3/uL (ref 0.7–4.0)
MCH: 30.6 pg (ref 26.0–34.0)
MCHC: 31.5 g/dL (ref 30.0–36.0)
MCV: 97.1 fL (ref 80.0–100.0)
Monocytes Absolute: 0.9 10*3/uL (ref 0.1–1.0)
Monocytes Relative: 8 %
Neutro Abs: 8.9 10*3/uL — ABNORMAL HIGH (ref 1.7–7.7)
Neutrophils Relative %: 72 %
Platelets: 375 10*3/uL (ref 150–400)
RBC: 3.4 MIL/uL — ABNORMAL LOW (ref 3.87–5.11)
RDW: 15.2 % (ref 11.5–15.5)
WBC: 12.2 10*3/uL — ABNORMAL HIGH (ref 4.0–10.5)
nRBC: 0.4 % — ABNORMAL HIGH (ref 0.0–0.2)

## 2020-07-01 LAB — GLUCOSE, CAPILLARY
Glucose-Capillary: 146 mg/dL — ABNORMAL HIGH (ref 70–99)
Glucose-Capillary: 150 mg/dL — ABNORMAL HIGH (ref 70–99)
Glucose-Capillary: 219 mg/dL — ABNORMAL HIGH (ref 70–99)
Glucose-Capillary: 238 mg/dL — ABNORMAL HIGH (ref 70–99)
Glucose-Capillary: 255 mg/dL — ABNORMAL HIGH (ref 70–99)
Glucose-Capillary: 278 mg/dL — ABNORMAL HIGH (ref 70–99)

## 2020-07-01 LAB — BASIC METABOLIC PANEL
Anion gap: 15 (ref 5–15)
BUN: 22 mg/dL — ABNORMAL HIGH (ref 6–20)
CO2: 26 mmol/L (ref 22–32)
Calcium: 9.1 mg/dL (ref 8.9–10.3)
Chloride: 101 mmol/L (ref 98–111)
Creatinine, Ser: 0.66 mg/dL (ref 0.44–1.00)
GFR, Estimated: >60 mL/min (ref >60)
Glucose, Bld: 174 mg/dL — ABNORMAL HIGH (ref 70–99)
Potassium: 4.1 mmol/L (ref 3.5–5.1)
Sodium: 142 mmol/L (ref 135–145)

## 2020-07-01 LAB — MAGNESIUM: Magnesium: 2.2 mg/dL (ref 1.7–2.4)

## 2020-07-01 LAB — PHOSPHORUS: Phosphorus: 3.6 mg/dL (ref 2.5–4.6)

## 2020-07-01 MED ORDER — FUROSEMIDE 10 MG/ML IJ SOLN
20.0000 mg | Freq: Once | INTRAMUSCULAR | Status: AC
  Administered 2020-07-01: 20 mg via INTRAVENOUS
  Filled 2020-07-01: qty 2

## 2020-07-01 MED ORDER — NEPRO/CARBSTEADY PO LIQD
237.0000 mL | Freq: Three times a day (TID) | ORAL | Status: DC
  Administered 2020-07-01 – 2020-07-06 (×14): 237 mL via ORAL

## 2020-07-01 MED ORDER — METOPROLOL TARTRATE 5 MG/5ML IV SOLN
5.0000 mg | Freq: Once | INTRAVENOUS | Status: AC
  Administered 2020-07-01: 5 mg via INTRAVENOUS
  Filled 2020-07-01: qty 5

## 2020-07-01 MED ORDER — FAMOTIDINE IN NACL 20-0.9 MG/50ML-% IV SOLN
20.0000 mg | Freq: Two times a day (BID) | INTRAVENOUS | Status: DC
  Administered 2020-07-01 – 2020-07-03 (×5): 20 mg via INTRAVENOUS
  Filled 2020-07-01 (×7): qty 50

## 2020-07-01 NOTE — Progress Notes (Signed)
Nutrition Follow-up  DOCUMENTATION CODES:   Not applicable  INTERVENTION:  Provide Nepro Shake po TID, each supplement provides 425 kcal and 19 grams protein.  Recommend increasing bowel regimen as patient has not had a documented BM this admission.  NUTRITION DIAGNOSIS:   Increased nutrient needs related to catabolic illness (ATFTD-32) as evidenced by estimated needs.  New diagnosis.  GOAL:   Patient will meet greater than or equal to 90% of their needs  Progressing.  MONITOR:   PO intake,Supplement acceptance,Diet advancement,Labs,Weight trends,I & O's  REASON FOR ASSESSMENT:   Ventilator,Consult Enteral/tube feeding initiation and management  ASSESSMENT:   55 year old female with PMHx of DM, gastroparesis, esophageal stricture, HLD, OP, anxiety, GERD, H pylori infection, vitamin D deficiency, HTN, nephrolithiasis, Graves disease, depression, asthma, glaucoma, hypothyroidism, sleep apnea admitted with COVID-19 PNA, DKA, encephalopathy.  1/13 intubated 1/20 extubated  Patient able to track with eyes but is unable to follow commands at this time. SLP consult was placed to assess for possible diet advancement. Will monitor for diet advancement. If diet unable to be advanced patient would benefit from placement of small-bore feeding tube (Dobbhoff) for initiation of tube feeds. Patient has not had a documented BM this admission. Once patient can take po vs enteral access placed consider increasing bowel regimen.  Later in the afternoon diet was advanced to dysphagia 1 with nectar-thick liquids.  Medications reviewed and include: Colace 100 mg BID, Lasix 20 mg IV once today, Novolog 0-15 units Q4hrs, levothyroxine, MVI daily, thiamine 100 mg daily, famotidine.  Labs reviewed: CBG 127-186, BUN 22.  I/O: 3095 mL UOP yesterday (2.2 mL/kg/hr)  Weight trend: 59.9 kg on 1/21; -5.6 kg from 1/12  Discussed on rounds.  Diet Order:   Diet Order            DIET - DYS 1 Room  service appropriate? Yes with Assist; Fluid consistency: Nectar Thick  Diet effective now                EDUCATION NEEDS:   No education needs have been identified at this time  Skin:  Skin Assessment: Skin Integrity Issues: Skin Integrity Issues:: DTI DTI: right heel (2cm x 2cm)  Last BM:  Unknown/PTA  Height:   Ht Readings from Last 1 Encounters:  06/22/20 4\' 11"  (1.499 m)   Weight:   Wt Readings from Last 1 Encounters:  07/01/20 59.9 kg   Ideal Body Weight:  44.7 kg  BMI:  Body mass index is 26.67 kg/m.  Estimated Nutritional Needs:   Kcal:  1800-2000  Protein:  90-100 grams  Fluid:  1.8-2 L/day  Jacklynn Barnacle, MS, RD, LDN Pager number available on Amion

## 2020-07-01 NOTE — Progress Notes (Signed)
PHARMACY CONSULT NOTE - FOLLOW UP  Pharmacy Consult for Electrolyte Monitoring and Replacement   Recent Labs: Potassium (mmol/L)  Date Value  07/01/2020 4.1   Magnesium (mg/dL)  Date Value  07/01/2020 2.2   Calcium (mg/dL)  Date Value  07/01/2020 9.1   Albumin (g/dL)  Date Value  06/26/2020 2.3 (L)  01/09/2019 4.5   Phosphorus (mg/dL)  Date Value  07/01/2020 3.6   Sodium (mmol/L)  Date Value  07/01/2020 142  01/30/2019 142     Assessment: 55 year old female admitted with metabolic encephalopathy in setting of Covid and DKA/HHS. Patient required intubation 1/13. She has been on and off of the insulin drip. Currently remains off drip on subcu regimen. Pharmacy to manage electrolytes.  Extubated 1/20.  Goal of Therapy:  Electrolytes WNL  Plan:  Speech evaluation pending. Patient to receive one dose of Lasix 20 mg IV x 1. Potassium > 4.0. Follow up with morning labs.  Dorena Bodo, PharmD Clinical Pharmacist 07/01/2020

## 2020-07-01 NOTE — Progress Notes (Signed)
Brief Neuro Update:  Discused with CCM team, patient has shown sustained improvement and is now extubated. Workup for neurological causes of AMS so far has been negative with no seizures on rEEG. Negative MRI and CTH without contrast. Encephalopathy labs with no significant abnormalities. Given improvement, we will signoff. Please feel free to contact us with any questions or concerns.  [place] Pager Number [phone]

## 2020-07-01 NOTE — Evaluation (Addendum)
Clinical/Bedside Swallow Evaluation Patient Details  Name: Ashley [name] MRN: [id number] Date of Birth: [birth year]  Today's Date: 07/01/2020 Time: SLP Start Time (ACUTE ONLY): 1250 SLP Stop Time (ACUTE ONLY): 1350 SLP Time Calculation (min) (ACUTE ONLY): 60 min  Past Medical History:  Past Medical History:  Diagnosis Date  . Anxiety   . Asthma   . Depression   . Diabetes mellitus ([place])   . Esophageal stricture   . Gastroparesis   . GERD (gastroesophageal reflux disease)   . Glaucoma   . Graves disease 02/2010   diagnosed by radioactive iodine uptake and scan   . Helicobacter pylori infection   . Hyperlipidemia   . Hypertension   . Hypothyroidism   . Insulin pump in place   . Nephrolithiasis 2010   rt sided per GI  . Neuropathy    diabetic neuropathy  . Osteoporosis   . Sleep apnea   . Sleep apnea with use of continuous positive airway pressure (CPAP)   . Vitamin D deficiency    Past Surgical History:  Past Surgical History:  Procedure Laterality Date  . ABDOMINAL AORTOGRAM W/LOWER EXTREMITY Bilateral 01/01/2019   Procedure: ABDOMINAL AORTOGRAM W/LOWER EXTREMITY;  Surgeon: Lorretta Harp, MD;  Location: [facility];  Service: Cardiovascular;  Laterality: Bilateral;  . BREAST EXCISIONAL BIOPSY    . BREAST LUMPECTOMY Left    LEFT-BENIGN  . NASAL SEPTOPLASTY W/ TURBINOPLASTY Bilateral 09/07/2013   Procedure: SEPTOPLASTY, BILATERAL TURBINATE RESECTION ;  Surgeon: Ascencion Dike, MD;  Location: [facility];  Service: ENT;  Laterality: Bilateral;  . PERIPHERAL VASCULAR INTERVENTION Bilateral 01/01/2019   Procedure: PERIPHERAL VASCULAR INTERVENTION;  Surgeon: Lorretta Harp, MD;  Location: [facility];  Service: Cardiovascular;  Laterality: Bilateral;  . UPPER GASTROINTESTINAL ENDOSCOPY    . VAGINAL HYSTERECTOMY  2006   HPI:  Pt  is a 55 y.o. female with multiple medical history significant for Graves Dis., depression, Diabetes complicated by  gastroparesis, esophageal stricture, and neuropathy and peripheral artery disease on insulin pump for several years, OSA on CPAP, PAD with history of biiliac stents, moderate persistent asthma, OSA on CPAP, obesity, nicotine dependence, HTN, hypothyroidism, osteoporosis and chronic pain who tested positive for COVID just over a week ago was brought in to the emergency room with a 3-day complaint of cough and shortness of breath, fever and body aches and generalized weakness and not acting herself.  She denies chest pain, vomiting and diarrhea abdominal pain.  Pt was orally intubated on 06/23/2020; extubated 06/30/2020. Covid Positive. Encephalopathy in the setting of COVID-19 Pneumonia & DKA/HHS. CXR: Basilar dependent peribronchovascular airspace opacities at admit, covid pneumonia.  Assessment / Plan / Recommendation Clinical Impression  Pt appears to present w/ min-mod oropharyngeal phase dysphagia in light of Significantly declined Cognitive status; reduced attention to task and need for verbal cues for follow through w/ po tasks. This presentation impacts her overall awareness/engagement and safety during po tasks which increases risk for aspiration, choking. Risk for aspiration appeared reduced when following general aspiration precautions, using a modified diet consistency, and when given feeding assistance and verbal cues. She required mod tactile/verbal/ visual cues for orientation to bolus presentation, and during feeding support (she often exhibited mumbled speech). Pt consumed trials of ice chips w/ delayed cough x2/6 trials then Nectar liquids and purees w/ No further overt, clinical s/s of aspiration noted; no decline in vocal quality during few verbalizations, no cough, and no decline in respiratory status during/post trials(O2  sats 99-100%, RR 19-21, HR 110. Oral phase was adequate for bolus management and oral clearing of the bolus trials given. Fairly timely A-P transfer was noted w/ no  significant oral holding. OM Exam appeared [facility] w/ No unilateral weakness noted; pt hesitant in her follow through w/ OM tasks. Noted pt's Cough was Fair w/ min difficulty w/ volitional cough; Vocal Quality raspy and low volume(suspect related to recent lengthy oral intubation). Pt appeared confused w/ attempted oral care and w/ SLP.  Due to pt's declined Cognitive status and her risk for aspiration, recommend initiation of the dysphagia level 1(puree) w/ Nectar liquids; aspiration precautions; reduce Distractions during meals and only give po's when pt is alert and participative. Pills Crushed in Puree for safer swallowing. Support w/ feeding at meals but allow pt to help engage by [employer] -- check for oral clearing during/post intake. NSG updated. ST will f/u w/ pt's status next 2-3 days for readiness to upgrade diet consistency. Will f/u MD/Neurology notes re: declined Cognitive presentation - pt may need f/u w/ at D/C for Cognitive-linguistic assessment/tx. Precautions posted in room. SLP Visit Diagnosis: Dysphagia, oropharyngeal phase (R13.12)    Aspiration Risk  Risk for inadequate nutrition/hydration;Mild aspiration risk;Moderate aspiration risk    Diet Recommendation  Dysphagia level 1(puree) w/ gravies; Nectar liquids. General aspiration precautions; REFLUX precautions. Feeding Support and Supervision d/t Cognitive decline currently  Medication Administration: Crushed with puree (for safer swallowing)    Other  Recommendations Recommended Consults:  (dietician f/u) Oral Care Recommendations: Oral care BID;Oral care before and after PO;Staff/trained caregiver to provide oral care Other Recommendations: Order thickener from pharmacy;Remove water pitcher;Have oral suction available;Prohibited food (jello, ice cream, thin soups)   Follow up Recommendations Inpatient Rehab;Skilled Nursing facility (TBD)      Frequency and Duration min 3x week  2 weeks       Prognosis Prognosis for Safe  Diet Advancement: Fair (-Good) Barriers to Reach Goals: Cognitive deficits;Language deficits;Severity of deficits;Time post onset      Swallow Study   General Date of Onset: 06/21/20 HPI: Pt  is a 54 y.o. female with multiple medical history significant for Graves Dis., depression, Diabetes complicated by gastroparesis, esophageal stricture, and neuropathy and peripheral artery disease on insulin pump for several years, OSA on CPAP, PAD with history of biiliac stents, moderate persistent asthma, OSA on CPAP, obesity, nicotine dependence, HTN, hypothyroidism, osteoporosis and chronic pain who tested positive for COVID just over a week ago was brought in to the emergency room with a 3-day complaint of cough and shortness of breath, fever and body aches and generalized weakness and not acting herself.  She denies chest pain, vomiting and diarrhea abdominal pain.  Pt was orally intubated on 06/23/2020; extubated 06/30/2020. Covid Positive. Encephalopathy in the setting of COVID-19 Pneumonia & DKA/HHS Type of Study: Bedside Swallow Evaluation Previous Swallow Assessment: none Diet Prior to this Study: NPO (regular diet at home prior to admit) Temperature Spikes Noted: No (wbc 12.2) Respiratory Status: Nasal cannula (2L) History of Recent Intubation: Yes Length of Intubations (days): 8 days Date extubated: 06/30/20 Behavior/Cognition: Alert;Cooperative;Pleasant mood;Confused;Distractible;Requires cueing Oral Cavity Assessment: Dry Oral Care Completed by SLP: Yes Oral Cavity - Dentition: Adequate natural dentition Vision: Functional for self-feeding Self-Feeding Abilities: Needs assist;Needs set up;Total assist Patient Positioning: Upright in bed (needed positioning) Baseline Vocal Quality: Low vocal intensity (mumbled speech) Volitional Cough:  (Fair) Volitional Swallow: Unable to elicit    Oral/Motor/Sensory Function Overall Oral Motor/Sensory Function: Within functional limits (w/ general OM  movements and bolus trials)   Ice Chips Ice chips: Impaired Presentation: Spoon (fed; 6 trials) Oral Phase Impairments: Poor awareness of bolus Pharyngeal Phase Impairments: Cough - Delayed (x2)   Thin Liquid Thin Liquid: Not tested    Nectar Thick Nectar Thick Liquid: Within functional limits Presentation: Cup;Spoon;Straw (feeding support; ~3 ozs)   Honey Thick Honey Thick Liquid: Not tested   Puree Puree: Within functional limits Presentation: Spoon (fed; ~3 ozs)   Solid     Solid: Not tested       Orinda Kenner, MS, CCC-SLP Speech Language Pathologist Rehab Services [phone] Wong [place] 07/01/2020,4:17 PM

## 2020-07-01 NOTE — Progress Notes (Signed)
NAME:  Ashley [name], MRN:  [id number], DOB:  [birth year], LOS: 73 ADMISSION DATE:  06/20/2020, CONSULTATION DATE:  06/23/2020 REFERRING MD:  Dr. Priscella Mann, CHIEF COMPLAINT:  Altered Mental Status, COVID-19 Pneumonia  Brief History:  55 y.o. Female admitted with Acute Metabolic Encephalopathy in the setting of COVID-19 Pneumonia & DKA/HHS.  History of Present Illness:  55 year old female extensive past medical history as per medical record who presents for lethargy, subjective fevers at home, decreased level of consciousness. Found to have multifocal pneumonia in the setting of COVID-19 infection. Also with elevated procalcitonin concerning for bacterial pulm infection. Also presented with DKA/HHS. She has been on intravenous insulin infusion via Endo tool protocol. Anion gap closed. Glycemic control improved. Will transition off of IV insulin.   06/24/20- patient on MV critically ill.  Labwork this am with hypernatremia, aki, transaminitis, hypokalemia. CXR with bilateral air space opacification worse at RLL.   Weaning levophed.  I spoke with husband Saarah Dewing and reviewed care plan with him, he will be coming in this afternoon to see patient.  06/25/20- patient failed SBT today. WUA required ativan due to aggitation.  06/26/20- patient remain on MV with complete weaning off levophed support this am.  Will attempt SBT today, had altered mentation yesterday.  Optimizing metabolic derrangements to improve SBT.  06/27/20 - Start precedex and wean off versed due to agitation/delirium 06/29/20 - Patient on propofol and fentanyl, weaning sedation  06/30/20 - no issues overnight, patient more alert this morning 07/01/2020: extubated yesterday without sequela  Past Medical History:   Active Ambulatory Problems    Diagnosis Date Noted  . DIAB W/O MENTION COMP TYPE I [JUV TYPE] UNCNTRL 02/28/2009  . GERD 02/28/2009  . Gastroparesis 03/22/2009  . DYSPHAGIA UNSPECIFIED 02/28/2009  . DYSPHAGIA  03/22/2009  . ABDOMINAL PAIN-EPIGASTRIC 02/28/2009  . Nausea with vomiting 10/22/2012  . Diarrhea 10/22/2012  . Dyspnea and respiratory abnormality 04/26/2015  . Chest tightness 04/26/2015  . Hoarseness of voice 04/26/2015  . OSA and COPD overlap syndrome ([place]) 04/02/2018  . Excessive daytime sleepiness 04/02/2018  . Delayed sleep phase syndrome 04/02/2018  . Moderate persistent asthma 04/02/2018  . Loud snoring 05/19/2018  . OSA on CPAP 10/08/2018  . Essential hypertension 12/25/2018  . Hyperlipidemia 12/25/2018  . Peripheral arterial disease ([place]) 12/25/2018  . Claudication in peripheral vascular disease ([place]) 01/01/2019  . Tobacco abuse 02/24/2020  . Hypertensive disorder 12/09/2018   Resolved Ambulatory Problems    Diagnosis Date Noted  . No Resolved Ambulatory Problems   Past Medical History:  Diagnosis Date  . Anxiety   . Asthma   . Depression   . Diabetes mellitus ([place])   . Esophageal stricture   . GERD (gastroesophageal reflux disease)   . Glaucoma   . Graves disease 02/2010  . Helicobacter pylori infection   . Hypertension   . Hypothyroidism   . Insulin pump in place   . Nephrolithiasis 2010  . Neuropathy   . Osteoporosis   . Sleep apnea   . Sleep apnea with use of continuous positive airway pressure (CPAP)   . Vitamin D deficiency      Significant Hospital Events:  1/10: Presented to ED; Hospitalist admit to [facility] 1/13: PCCM consult  Consults:  Hospitalist (primary service) Neurology PCCM Diabetes Coordinator  Procedures:  N/A  Significant Diagnostic Tests:  1/11: CT Head w/o Contrast>>Mild chronic ischemic white matter disease. No acute intracranial abnormality seen. 1/11: CTA Chest>>1. Extensive motion artifact limits assessment without signs of central  or lobar level pulmonary embolism. 2. Diffuse areas of consolidative change, nodularity and ground-glass. Findings show midlung and basilar predominance. Findings of multifocal  pneumonia in the setting of COVID-19 infection. 3. Mild mediastinal and hilar nodal enlargement likely reactive. 4. Aortic atherosclerosis. 1/11: MR Brain w/o contrast>>No acute abnormality. Mild white matter changes similar to 2010. Mild atrophy with progression since 2010.  06/28/20 CT Head - No acute findings  Micro Data:  1/11: SARS-CoV-2 PCR>> Positive 1/11: Influenza A&B PCR>>negative  Medications:   Scheduled Meds: . chlorhexidine  15 mL Mouth Rinse BID  . Chlorhexidine Gluconate Cloth  6 each Topical Daily  . docusate  100 mg Oral BID  . enoxaparin (LOVENOX) injection  40 mg Subcutaneous Q24H  . furosemide  20 mg Intravenous Once  . insulin aspart  0-15 Units Subcutaneous Q4H  . levothyroxine  112 mcg Oral Q0600  . mouth rinse  15 mL Mouth Rinse q12n4p  . metoprolol tartrate  12.5 mg Oral BID  . multivitamin with minerals  1 tablet Oral Daily  . QUEtiapine  50 mg Oral QHS  . sodium chloride flush  10-40 mL Intracatheter Q12H  . thiamine injection  100 mg Intravenous Daily   Or  . thiamine  100 mg Oral Daily   Continuous Infusions: . sodium chloride Stopped (06/29/20 0619)  . dexmedetomidine (PRECEDEX) IV infusion Stopped (06/28/20 0357)  . famotidine (PEPCID) IV     PRN Meds:.sodium chloride, acetaminophen, acetaminophen, dextrose, polyethylene glycol, sodium chloride flush    Objective   Blood pressure (!) 150/76, pulse (!) 105, temperature 99.5 F (37.5 C), temperature source Axillary, resp. rate 14, height 4\' 11"  (1.499 m), weight 59.9 kg, SpO2 94 %.        Intake/Output Summary (Last 24 hours) at 07/01/2020 1258 Last data filed at 07/01/2020 0900 Gross per 24 hour  Intake 133.29 ml  Output 2625 ml  Net -2491.71 ml   Filed Weights   06/28/20 0500 06/30/20 0431 07/01/20 0436  Weight: 66.1 kg 64.3 kg 59.9 kg    PHYSICAL EXAMINATION: Physical examination is limited due to need for PPE/CAPR GENERAL: Extubated, tracks, not following commands  readily HEAD: Normocephalic, atraumatic.  EYES: PERRL,  No scleral icterus. Mild scleral edema MOUTH: No thrush. NECK: Supple. No JVD. PULMONARY: coarse breath sounds on right. No wheezing. CARDIOVASCULAR: Monitor: Mild tachycardia, regular GASTROINTESTINAL: Soft, nontender, non-distended. MUSCULOSKELETAL: No swelling, clubbing, or edema.  NEUROLOGIC: awake, following some but not all commands.   SKIN: intact, no rashes  Labs   CBC: Recent Labs  Lab 06/26/20 0524 06/27/20 0312 06/28/20 0606 06/29/20 0516 06/30/20 0602 07/01/20 0438  WBC 10.8* 10.5 8.5 9.0 10.3 12.2*  NEUTROABS 8.8*  --  5.3 5.8 6.4 8.9*  HGB 9.6* 9.3* 9.0* 8.4* 8.9* 10.4*  HCT 30.6* 29.0* 27.9* 26.1* 27.8* 33.0*  MCV 97.5 97.6 95.9 96.0 97.2 97.1  PLT 336 354 328 302 [id number]    Basic Metabolic Panel: Recent Labs  Lab 06/27/20 0312 06/27/20 1511 06/28/20 0606 06/29/20 0516 06/30/20 0602 07/01/20 0438  NA 152* 148* 142 143 145 142  K 4.3 3.5 2.9* 3.5 3.1* 4.1  CL 113* 104 102 104 103 101  CO2 31 28 30 29  32 26  GLUCOSE 261* 232* 151* 201* 100* 174*  BUN 43* 44* 35* 33* 23* 22*  CREATININE 0.92 0.93 0.82 0.81 0.55 0.66  CALCIUM 8.2* 8.6* 8.2* 8.4* 8.6* 9.1  MG 2.7*  --  2.1 2.1 1.9 2.2  PHOS 4.5  --  2.8 3.6 2.9 3.6   GFR: Estimated Creatinine Clearance: 63.3 mL/min (by C-G formula based on SCr of 0.66 mg/dL). Recent Labs  Lab 06/25/20 0614 06/26/20 0524 06/27/20 0312 06/28/20 0606 06/29/20 0516 06/30/20 0602 07/01/20 0438  PROCALCITON 0.13 <0.10  --   --   --   --   --   WBC 14.6* 10.8*   < > 8.5 9.0 10.3 12.2*   < > = values in this interval not displayed.    Liver Function Tests: Recent Labs  Lab 06/25/20 0614 06/26/20 0524  AST 24 24  ALT 21 18  ALKPHOS 74 76  BILITOT 0.5 0.7  PROT 5.3* 5.5*  ALBUMIN 2.5* 2.3*   No results for input(s): LIPASE, AMYLASE in the last 168 hours. Recent Labs  Lab 06/28/20 1534  AMMONIA 20    ABG    Component Value Date/Time   PHART  7.45 06/23/2020 2100   PCO2ART 45 06/23/2020 2100   PO2ART 243 (H) 06/23/2020 2100   HCO3 31.3 (H) 06/23/2020 2100   TCO2 32 05/05/2020 0200   ACIDBASEDEF 6.4 (H) 06/21/2020 0120   O2SAT 99.9 06/23/2020 2100     Coagulation Profile: No results for input(s): INR, PROTIME in the last 168 hours.  Cardiac Enzymes: No results for input(s): CKTOTAL, CKMB, CKMBINDEX, TROPONINI in the last 168 hours.  HbA1C: Hgb A1c MFr Bld  Date/Time Value Ref Range Status  06/21/2020 03:23 AM 8.2 (H) 4.8 - 5.6 % Final    Comment:    (NOTE) Pre diabetes:          5.7%-6.4%  Diabetes:              >6.4%  Glycemic control for   <7.0% adults with diabetes     CBG: Recent Labs  Lab 06/30/20 1613 06/30/20 1942 06/30/20 2358 07/01/20 0425 07/01/20 0802  GLUCAP 228* 186* 127* 150* 146*   CXR 06/27/20 reviewed The endotracheal tube terminates above the carina. The enteric tube extends below the left hemidiaphragm. There are persistent bilateral airspace opacities, not substantially changed from prior study. The right-sided PICC line is unchanged there is no pneumothorax. There are probable small bilateral pleural effusions. The esophageal temperature probe terminates in the mid esophagus level.  Resolved Hospital Problem list   Shock   Assessment & Plan:  55 yo obese white female with acute and severe resp failure with acute and severe ARDS with b/l pneumonia due to COVID pneumonia with acute COPD exacerbation  Severe COVID-19 infection, ARDS and pneumonia/pneumonitis - Remdesevir antiviral - 1/10 to 1/15 - Methylprednisolone 40mg  BID 1/12 - 1/17, 40mg  daily 1/18 with an end date of 1/21 - Completed 7 day course of unasyn for aspiration coverage, end date 1/19 - Neuro status remains tenuous, however, extubated -Wean off Precedex   Encephalopathy In setting of sepsis/COVID-19 - MRI brain with no acute abnormality 06/21/20 - CT Head 1/18 no acute findigns - Seroquel,  discontinue   Tachycardia and Hypertension - Metoprolol 12.5mg  BID  Hypernatremia -Corrected  Hx of Hypothyroidism - synthroid restarted  DVT/GI PRX ordered and assessed TRANSFUSIONS AS NEEDED MONITOR FSBS I Assessed the need for Labs I Assessed the need for Foley I Assessed the need for Central Venous Line Family Discussion when available I Assessed the need for Mobilization I made an Assessment of medications to be adjusted accordingly Safety Risk assessment completed  CASE DISCUSSED IN MULTIDISCIPLINARY ROUNDS WITH ICU TEAM  Downgrade to stepdown status.  Consider transfer to [facility] in the morning  if remains stable.    Level 3 follow-up  C. Derrill Kay, MD [place] [facility]   *This note was dictated using voice recognition software/Dragon.  Despite best efforts to proofread, errors can occur which can change the meaning.  Any change was purely unintentional.

## 2020-07-02 ENCOUNTER — Encounter: Payer: Self-pay | Admitting: Internal Medicine

## 2020-07-02 LAB — CBC WITH DIFFERENTIAL/PLATELET
Abs Immature Granulocytes: 0.13 10*3/uL — ABNORMAL HIGH (ref 0.00–0.07)
Basophils Absolute: 0 10*3/uL (ref 0.0–0.1)
Basophils Relative: 0 %
Eosinophils Absolute: 0.1 10*3/uL (ref 0.0–0.5)
Eosinophils Relative: 1 %
HCT: 33.4 % — ABNORMAL LOW (ref 36.0–46.0)
Hemoglobin: 10.9 g/dL — ABNORMAL LOW (ref 12.0–15.0)
Immature Granulocytes: 1 %
Lymphocytes Relative: 21 %
Lymphs Abs: 2.1 10*3/uL (ref 0.7–4.0)
MCH: 31.1 pg (ref 26.0–34.0)
MCHC: 32.6 g/dL (ref 30.0–36.0)
MCV: 95.2 fL (ref 80.0–100.0)
Monocytes Absolute: 0.7 10*3/uL (ref 0.1–1.0)
Monocytes Relative: 7 %
Neutro Abs: 7 10*3/uL (ref 1.7–7.7)
Neutrophils Relative %: 70 %
Platelets: 384 10*3/uL (ref 150–400)
RBC: 3.51 MIL/uL — ABNORMAL LOW (ref 3.87–5.11)
RDW: 15 % (ref 11.5–15.5)
WBC: 10.1 10*3/uL (ref 4.0–10.5)
nRBC: 0.2 % (ref 0.0–0.2)

## 2020-07-02 LAB — BASIC METABOLIC PANEL
Anion gap: 12 (ref 5–15)
BUN: 29 mg/dL — ABNORMAL HIGH (ref 6–20)
CO2: 26 mmol/L (ref 22–32)
Calcium: 8.7 mg/dL — ABNORMAL LOW (ref 8.9–10.3)
Chloride: 101 mmol/L (ref 98–111)
Creatinine, Ser: 0.73 mg/dL (ref 0.44–1.00)
GFR, Estimated: >60 mL/min (ref >60)
Glucose, Bld: 315 mg/dL — ABNORMAL HIGH (ref 70–99)
Potassium: 4.2 mmol/L (ref 3.5–5.1)
Sodium: 139 mmol/L (ref 135–145)

## 2020-07-02 LAB — VITAMIN B1: Vitamin B1 (Thiamine): 151.7 nmol/L (ref 66.5–200.0)

## 2020-07-02 LAB — GLUCOSE, CAPILLARY
Glucose-Capillary: 150 mg/dL — ABNORMAL HIGH (ref 70–99)
Glucose-Capillary: 202 mg/dL — ABNORMAL HIGH (ref 70–99)
Glucose-Capillary: 203 mg/dL — ABNORMAL HIGH (ref 70–99)
Glucose-Capillary: 246 mg/dL — ABNORMAL HIGH (ref 70–99)
Glucose-Capillary: 339 mg/dL — ABNORMAL HIGH (ref 70–99)
Glucose-Capillary: 360 mg/dL — ABNORMAL HIGH (ref 70–99)
Glucose-Capillary: 390 mg/dL — ABNORMAL HIGH (ref 70–99)
Glucose-Capillary: 94 mg/dL (ref 70–99)

## 2020-07-02 LAB — PHOSPHORUS: Phosphorus: 2.8 mg/dL (ref 2.5–4.6)

## 2020-07-02 LAB — MAGNESIUM: Magnesium: 2.2 mg/dL (ref 1.7–2.4)

## 2020-07-02 MED ORDER — INSULIN GLARGINE 100 UNIT/ML ~~LOC~~ SOLN
10.0000 [IU] | Freq: Every day | SUBCUTANEOUS | Status: DC
  Filled 2020-07-02 (×2): qty 0.1

## 2020-07-02 MED ORDER — INSULIN DETEMIR 100 UNIT/ML ~~LOC~~ SOLN
12.0000 [IU] | Freq: Two times a day (BID) | SUBCUTANEOUS | Status: DC
  Administered 2020-07-02 – 2020-07-06 (×8): 12 [IU] via SUBCUTANEOUS
  Filled 2020-07-02 (×11): qty 0.12

## 2020-07-02 MED ORDER — INSULIN ASPART 100 UNIT/ML ~~LOC~~ SOLN
7.0000 [IU] | Freq: Three times a day (TID) | SUBCUTANEOUS | Status: DC
  Administered 2020-07-03 – 2020-07-06 (×9): 7 [IU] via SUBCUTANEOUS
  Filled 2020-07-02 (×9): qty 1

## 2020-07-02 MED ORDER — INSULIN ASPART 100 UNIT/ML ~~LOC~~ SOLN
10.0000 [IU] | Freq: Once | SUBCUTANEOUS | Status: AC
  Administered 2020-07-02: 10 [IU] via SUBCUTANEOUS

## 2020-07-02 NOTE — Evaluation (Signed)
Physical Therapy Evaluation Patient Details Name: Ashley [name] MRN: [id number] DOB: 30-Oct-1965 Today's Date: 07/02/2020   History of Present Illness  Pt  is a 55 y.o. female with multiple medical history significant for Graves Dis., depression, Diabetes complicated by gastroparesis, esophageal stricture, and neuropathy and peripheral artery disease on insulin pump for several years, OSA on CPAP, PAD with history of biiliac stents, moderate persistent asthma, obesity, nicotine dependence, HTN, hypothyroidism, osteoporosis and chronic pain and was brought in to the emergency room with AMS. intubated on 06/23/2020; extubated 06/30/2020. Covid Positive. Encephalopathy in the setting of COVID-19 Pneumonia & DKA/HHS.  Clinical Impression  Pt is up to side of bed with PT help and worked on control of sitting balance with UE"s, then scooting with UE's.  Pt is unsteady and leaning over often, did not appear steady enough to stand.  Will recommend putting pt in a floor bed to lower the height and increase control of her balance as she is below 5' tall.  Follow for acute PT goals, with the expectation of CIR admission due to her age and prior independence.  Pt is appropriate as she is willing to work an should be able to progress to more independence.  May not gain admission due to Covid, but will default to SNF if this is so.    Follow Up Recommendations CIR    Equipment Recommendations  None recommended by PT    Recommendations for Other Services Rehab consult     Precautions / Restrictions Precautions Precautions: Fall;Other (comment) (lines, telemetry, confusion/delirium) Restrictions Weight Bearing Restrictions: No      Mobility  Bed Mobility Overal bed mobility: Needs Assistance Bed Mobility: Supine to Sit;Sit to Supine     Supine to sit: Mod assist Sit to supine: Mod assist   General bed mobility comments: mod assist and supported trunk out, legs back    Transfers Overall  transfer level: Needs assistance Equipment used: 1 person hand held assist Transfers: Lateral/Scoot Transfers Sit to Stand: Mod assist;From elevated surface        Lateral/Scoot Transfers: Mod assist;Max assist;From elevated surface General transfer comment: pt needed continual cues to use UE's to support scooting up bed  Ambulation/Gait             General Gait Details: unable  Stairs            Wheelchair Mobility    Modified Rankin (Stroke Patients Only)       Balance Overall balance assessment: Needs assistance Sitting-balance support: Single extremity supported Sitting balance-Leahy Scale: Poor     Standing balance support: Bilateral upper extremity supported Standing balance-Leahy Scale: Poor                               Pertinent Vitals/Pain Pain Assessment: Faces Pain Score: 0-No pain Faces Pain Scale: No hurt    Home Living Family/patient expects to be discharged to:: Private residence Living Arrangements: Spouse/significant other Available Help at Discharge: Family;Available PRN/intermittently Type of Home: Apartment Home Access: Stairs to enter   [employer] of Steps:  (pt did not know)     Additional Comments: Poor historian - perseverative on urge to urinate    Prior Function Level of Independence: Independent         Comments: pt may have needed help     Hand Dominance        Extremity/Trunk Assessment   Upper Extremity Assessment Upper Extremity Assessment:  Defer to OT evaluation    Lower Extremity Assessment Lower Extremity Assessment: Generalized weakness (cannot focus on mm test commands)    Cervical / Trunk Assessment Cervical / Trunk Assessment: Normal  Communication   Communication: No difficulties  Cognition Arousal/Alertness: Awake/alert Behavior During Therapy: Impulsive Overall Cognitive Status: Impaired/Different from baseline Area of Impairment: Problem  solving;Awareness;Safety/judgement;Following commands;Memory;Attention;Orientation                 Orientation Level: Time;Situation Current Attention Level: Selective Memory: Decreased recall of precautions;Decreased short-term memory Following Commands: Follows one step commands inconsistently;Follows one step commands with increased time Safety/Judgement: Decreased awareness of safety;Decreased awareness of deficits Awareness: Intellectual Problem Solving: Slow processing;Requires verbal cues;Requires tactile cues General Comments: pt is distracted initially to stay upright, leaned to R as PT lowered HOB but was not supporting on RUE      General Comments General comments (skin integrity, edema, etc.): sats and HR were WFL, noted her    Exercises Other Exercises Other Exercises: Pt educated re: OT role, DME recs, d/c recs, falls prevention, ECS Other Exercises: LBD, UBD, sup<>sit, sit<>stand, sitting/standing balance/tolerance   Assessment/Plan    PT Assessment Patient needs continued PT services  PT Problem List Decreased strength;Decreased range of motion;Decreased activity tolerance;Decreased balance;Decreased mobility;Decreased coordination;Decreased cognition;Decreased safety awareness       PT Treatment Interventions DME instruction;Gait training;Functional mobility training;Therapeutic exercise;Therapeutic activities;Balance training;Neuromuscular re-education;Patient/family education    PT Goals (Current goals can be found in the Care Plan section)  Acute Rehab PT Goals Patient Stated Goal: get stronger PT Goal Formulation: With patient Time For Goal Achievement: 07/16/20 Potential to Achieve Goals: Good    Frequency Min 2X/week   Barriers to discharge Decreased caregiver support home with no clear family support 24/7    Co-evaluation               AM-PAC PT "6 Clicks" Mobility  Outcome Measure Help needed turning from your back to your side while  in a flat bed without using bedrails?: A Lot Help needed moving from lying on your back to sitting on the side of a flat bed without using bedrails?: A Lot Help needed moving to and from a bed to a chair (including a wheelchair)?: A Lot Help needed standing up from a chair using your arms (e.g., wheelchair or bedside chair)?: Total Help needed to walk in hospital room?: Total Help needed climbing 3-5 steps with a railing? : Total 6 Click Score: 9    End of Session   Activity Tolerance: Patient limited by fatigue;Treatment limited secondary to medical complications (Comment) Patient left: in bed;with call bell/phone within reach;with bed alarm set Nurse Communication: Mobility status PT Visit Diagnosis: Muscle weakness (generalized) (M62.81);Other abnormalities of gait and mobility (R26.89)    Time: 2297-9892 PT Time Calculation (min) (ACUTE ONLY): 31 min   Charges:   PT Evaluation $PT Eval Moderate Complexity: 1 Mod PT Treatments $Therapeutic Activity: 8-22 mins       Ramond Dial 07/02/2020, 6:14 PM  Mee Hives, PT MS Acute Rehab Dept. Number: [place] [facility] [place]

## 2020-07-02 NOTE — Progress Notes (Signed)
NAME:  Ashley [name], MRN:  [id number], DOB:  05-12-1966, LOS: 46 ADMISSION DATE:  06/20/2020, CONSULTATION DATE:  06/23/2020 REFERRING MD:  Dr. Priscella Mann, CHIEF COMPLAINT:  Altered Mental Status, COVID-19 Pneumonia  Brief History:  55 y.o. Female admitted with Acute Metabolic Encephalopathy in the setting of COVID-19 Pneumonia & DKA/HHS.  History of Present Illness:  55 year old female extensive past medical history as per medical record who presents for lethargy, subjective fevers at home, decreased level of consciousness. Found to have multifocal pneumonia in the setting of COVID-19 infection. Also with elevated procalcitonin concerning for bacterial pulm infection. Also presented with DKA/HHS. She has been on intravenous insulin infusion via Endo tool protocol. Anion gap closed. Glycemic control improved. Will transition off of IV insulin.   06/24/20- patient on MV critically ill.  Labwork this am with hypernatremia, aki, transaminitis, hypokalemia. CXR with bilateral air space opacification worse at RLL.   Weaning levophed.  I spoke with husband Lolisa Musch and reviewed care plan with him, he will be coming in this afternoon to see patient.  06/25/20- patient failed SBT today. WUA required ativan due to aggitation.  06/26/20- patient remain on MV with complete weaning off levophed support this am.  Will attempt SBT today, had altered mentation yesterday.  Optimizing metabolic derrangements to improve SBT.  06/27/20 - Start precedex and wean off versed due to agitation/delirium 06/29/20 - Patient on propofol and fentanyl, weaning sedation  06/30/20 - no issues overnight, patient more alert this morning 07/01/2020: extubated yesterday without sequela 07/02/20- patient is stable today, conversive optimizing for [facility] transfer - signed out   per Epic secure chat.   Past Medical History:   Active Ambulatory Problems    Diagnosis Date Noted  . DIAB W/O MENTION COMP TYPE I [JUV TYPE]  UNCNTRL 02/28/2009  . GERD 02/28/2009  . Gastroparesis 03/22/2009  . DYSPHAGIA UNSPECIFIED 02/28/2009  . DYSPHAGIA 03/22/2009  . ABDOMINAL PAIN-EPIGASTRIC 02/28/2009  . Nausea with vomiting 10/22/2012  . Diarrhea 10/22/2012  . Dyspnea and respiratory abnormality 04/26/2015  . Chest tightness 04/26/2015  . Hoarseness of voice 04/26/2015  . OSA and COPD overlap syndrome ([place]) 04/02/2018  . Excessive daytime sleepiness 04/02/2018  . Delayed sleep phase syndrome 04/02/2018  . Moderate persistent asthma 04/02/2018  . Loud snoring 05/19/2018  . OSA on CPAP 10/08/2018  . Essential hypertension 12/25/2018  . Hyperlipidemia 12/25/2018  . Peripheral arterial disease ([place]) 12/25/2018  . Claudication in peripheral vascular disease ([place]) 01/01/2019  . Tobacco abuse 02/24/2020  . Hypertensive disorder 12/09/2018   Resolved Ambulatory Problems    Diagnosis Date Noted  . No Resolved Ambulatory Problems   Past Medical History:  Diagnosis Date  . Anxiety   . Asthma   . Depression   . Diabetes mellitus ([place])   . Esophageal stricture   . GERD (gastroesophageal reflux disease)   . Glaucoma   . Graves disease 02/2010  . Helicobacter pylori infection   . Hypertension   . Hypothyroidism   . Insulin pump in place   . Nephrolithiasis 2010  . Neuropathy   . Osteoporosis   . Sleep apnea   . Sleep apnea with use of continuous positive airway pressure (CPAP)   . Vitamin D deficiency      Significant Hospital Events:  1/10: Presented to ED; Hospitalist admit to [facility] 1/13: PCCM consult  Consults:  Hospitalist (primary service) Neurology PCCM Diabetes Coordinator  Procedures:  N/A  Significant Diagnostic Tests:  1/11: CT Head w/o Contrast>>Mild chronic ischemic  white matter disease. No acute intracranial abnormality seen. 1/11: CTA Chest>>1. Extensive motion artifact limits assessment without signs of central or lobar level pulmonary embolism. 2. Diffuse areas of consolidative  change, nodularity and ground-glass. Findings show midlung and basilar predominance. Findings of multifocal pneumonia in the setting of COVID-19 infection. 3. Mild mediastinal and hilar nodal enlargement likely reactive. 4. Aortic atherosclerosis. 1/11: MR Brain w/o contrast>>No acute abnormality. Mild white matter changes similar to 2010. Mild atrophy with progression since 2010.  06/28/20 CT Head - No acute findings  Micro Data:  1/11: SARS-CoV-2 PCR>> Positive 1/11: Influenza A&B PCR>>negative  Medications:   Scheduled Meds: . chlorhexidine  15 mL Mouth Rinse BID  . Chlorhexidine Gluconate Cloth  6 each Topical Daily  . docusate  100 mg Oral BID  . enoxaparin (LOVENOX) injection  40 mg Subcutaneous Q24H  . feeding supplement (NEPRO CARB STEADY)  237 mL Oral TID BM  . insulin aspart  0-15 Units Subcutaneous Q4H  . levothyroxine  112 mcg Oral Q0600  . mouth rinse  15 mL Mouth Rinse q12n4p  . metoprolol tartrate  12.5 mg Oral BID  . multivitamin with minerals  1 tablet Oral Daily  . QUEtiapine  50 mg Oral QHS  . sodium chloride flush  10-40 mL Intracatheter Q12H  . thiamine injection  100 mg Intravenous Daily   Or  . thiamine  100 mg Oral Daily   Continuous Infusions: . sodium chloride 5 mL/hr at 07/02/20 0550  . dexmedetomidine (PRECEDEX) IV infusion Stopped (06/28/20 0357)  . famotidine (PEPCID) IV Stopped (07/01/20 2133)   PRN Meds:.sodium chloride, acetaminophen, acetaminophen, dextrose, polyethylene glycol, sodium chloride flush    Objective   Blood pressure (!) 143/72, pulse 85, temperature 98 F (36.7 C), resp. rate 10, height 4\' 11"  (1.499 m), weight 59.9 kg, SpO2 100 %.        Intake/Output Summary (Last 24 hours) at 07/02/2020 0748 Last data filed at 07/02/2020 0550 Gross per 24 hour  Intake 535.84 ml  Output 2350 ml  Net -1814.16 ml   Filed Weights   06/28/20 0500 06/30/20 0431 07/01/20 0436  Weight: 66.1 kg 64.3 kg 59.9 kg    PHYSICAL  EXAMINATION: Physical examination is limited due to need for PPE/CAPR GENERAL: Extubated, tracks, not following commands readily HEAD: Normocephalic, atraumatic.  EYES: PERRL,  No scleral icterus. Mild scleral edema MOUTH: No thrush. NECK: Supple. No JVD. PULMONARY: coarse breath sounds on right. No wheezing. CARDIOVASCULAR: Monitor: Mild tachycardia, regular GASTROINTESTINAL: Soft, nontender, non-distended. MUSCULOSKELETAL: No swelling, clubbing, or edema.  NEUROLOGIC: awake, following some but not all commands.   SKIN: intact, no rashes  Labs   CBC: Recent Labs  Lab 06/26/20 0524 06/27/20 0312 06/28/20 0606 06/29/20 0516 06/30/20 0602 07/01/20 0438  WBC 10.8* 10.5 8.5 9.0 10.3 12.2*  NEUTROABS 8.8*  --  5.3 5.8 6.4 8.9*  HGB 9.6* 9.3* 9.0* 8.4* 8.9* 10.4*  HCT 30.6* 29.0* 27.9* 26.1* 27.8* 33.0*  MCV 97.5 97.6 95.9 96.0 97.2 97.1  PLT 336 354 328 302 342 734    Basic Metabolic Panel: Recent Labs  Lab 06/27/20 0312 06/27/20 1511 06/28/20 0606 06/29/20 0516 06/30/20 0602 07/01/20 0438  NA 152* 148* 142 143 145 142  K 4.3 3.5 2.9* 3.5 3.1* 4.1  CL 113* 104 102 104 103 101  CO2 31 28 30 29  32 26  GLUCOSE 261* 232* 151* 201* 100* 174*  BUN 43* 44* 35* 33* 23* 22*  CREATININE 0.92 0.93 0.82 0.81 0.55  0.66  CALCIUM 8.2* 8.6* 8.2* 8.4* 8.6* 9.1  MG 2.7*  --  2.1 2.1 1.9 2.2  PHOS 4.5  --  2.8 3.6 2.9 3.6   GFR: Estimated Creatinine Clearance: 63.3 mL/min (by C-G formula based on SCr of 0.66 mg/dL). Recent Labs  Lab 06/26/20 0524 06/27/20 0312 06/28/20 0606 06/29/20 0516 06/30/20 0602 07/01/20 0438  PROCALCITON <0.10  --   --   --   --   --   WBC 10.8*   < > 8.5 9.0 10.3 12.2*   < > = values in this interval not displayed.    Liver Function Tests: Recent Labs  Lab 06/26/20 0524  AST 24  ALT 18  ALKPHOS 76  BILITOT 0.7  PROT 5.5*  ALBUMIN 2.3*   No results for input(s): LIPASE, AMYLASE in the last 168 hours. Recent Labs  Lab 06/28/20 1534   AMMONIA 20    ABG    Component Value Date/Time   PHART 7.45 06/23/2020 2100   PCO2ART 45 06/23/2020 2100   PO2ART 243 (H) 06/23/2020 2100   HCO3 31.3 (H) 06/23/2020 2100   TCO2 32 05/05/2020 0200   ACIDBASEDEF 6.4 (H) 06/21/2020 0120   O2SAT 99.9 06/23/2020 2100     Coagulation Profile: No results for input(s): INR, PROTIME in the last 168 hours.  Cardiac Enzymes: No results for input(s): CKTOTAL, CKMB, CKMBINDEX, TROPONINI in the last 168 hours.  HbA1C: Hgb A1c MFr Bld  Date/Time Value Ref Range Status  06/21/2020 03:23 AM 8.2 (H) 4.8 - 5.6 % Final    Comment:    (NOTE) Pre diabetes:          5.7%-6.4%  Diabetes:              >6.4%  Glycemic control for   <7.0% adults with diabetes     CBG: Recent Labs  Lab 07/01/20 1715 07/01/20 2108 07/01/20 2350 07/02/20 0305 07/02/20 0740  GLUCAP 255* 278* 219* 150* 246*   CXR 06/27/20 reviewed The endotracheal tube terminates above the carina. The enteric tube extends below the left hemidiaphragm. There are persistent bilateral airspace opacities, not substantially changed from prior study. The right-sided PICC line is unchanged there is no pneumothorax. There are probable small bilateral pleural effusions. The esophageal temperature probe terminates in the mid esophagus level.  Resolved Hospital Problem list   [place]   Assessment & Plan:  55 yo obese white female with acute and severe resp failure with acute and severe ARDS with b/l pneumonia due to COVID pneumonia with acute COPD exacerbation  Severe COVID-19 infection, ARDS and pneumonia/pneumonitis - Remdesevir antiviral - 1/10 to 1/15 - Methylprednisolone 40mg  BID 1/12 - 1/17, 40mg  daily 1/18 with an end date of 1/21 - Completed 7 day course of unasyn for aspiration coverage, end date 1/19 - Neuro status remains tenuous, however, extubated -Wean off Precedex   Encephalopathy In setting of sepsis/COVID-19 - MRI brain with no acute abnormality  06/21/20 - CT Head 1/18 no acute findigns - Seroquel, discontinue   Tachycardia and Hypertension - Metoprolol 12.5mg  BID  Hypernatremia -Corrected  Hx of Hypothyroidism - synthroid restarted  DVT/GI PRX ordered and assessed TRANSFUSIONS AS NEEDED MONITOR FSBS I Assessed the need for Labs I Assessed the need for Foley I Assessed the need for Central Venous Line Family Discussion when available I Assessed the need for Mobilization I made an Assessment of medications to be adjusted accordingly Safety Risk assessment completed  CASE DISCUSSED IN [place] WITH ICU TEAM  Downgrade to stepdown status.  Consider transfer to [facility] in the morning if remains stable.    Critical care provider statement:    Critical care time (minutes):  33   Critical care time was exclusive of:  Separately billable procedures and  treating other patients   Critical care was necessary to treat or prevent imminent or  life-threatening deterioration of the following conditions:  acute hypoxemic respiratory failure due to [place] care was time spent personally by me on the following  activities:  Development of treatment plan with patient or surrogate,  discussions with consultants, evaluation of patient's response to  treatment, examination of patient, obtaining history from patient or  surrogate, ordering and performing treatments and interventions, ordering  and review of laboratory studies and re-evaluation of patient's condition   I assumed direction of critical care for this patient from another  provider in my specialty: no     *This note was dictated using voice recognition software/Dragon.  Despite best efforts to proofread, errors can occur which can change the meaning.  Any change was purely unintentional.    Ottie Glazier, M.D.  Pulmonary & [place]

## 2020-07-02 NOTE — Progress Notes (Signed)
Pt A&OX3 consistently this shift. Confusion noted at times. Follows commands and able to make needs known but does not volunteer needs unless asked. Pleasant and cooperative. F/c removed, purwick in place. Voiding clear yellow urine. Pt to be transferred to room 115. Husband notified. CBG's noted to be greater than 300  x2 this shift. SSI coverage given as ordered. Pt reports that she has an insulin pump that she used prior to coming to the hospital, confirmed this with her husband. He said that she had it with her when she arrived to the hospital.

## 2020-07-02 NOTE — Progress Notes (Signed)
Report called in to [facility] [place] on 1C.

## 2020-07-02 NOTE — Evaluation (Signed)
Occupational Therapy Evaluation Patient Details Name: Ashley [name] MRN: [id number] DOB: 12/20/1965 Today's Date: 07/02/2020    History of Present Illness Pt  is a 55 y.o. female with multiple medical history significant for Graves Dis., depression, Diabetes complicated by gastroparesis, esophageal stricture, and neuropathy and peripheral artery disease on insulin pump for several years, OSA on CPAP, PAD with history of biiliac stents, moderate persistent asthma, obesity, nicotine dependence, HTN, hypothyroidism, osteoporosis and chronic pain and was brought in to the emergency room with AMS. intubated on 06/23/2020; extubated 06/30/2020. Covid Positive. Encephalopathy in the setting of COVID-19 Pneumonia & DKA/HHS.   Clinical Impression   [name] was seen for OT evaluation this date. Pt is poor historian and unable to provide detailed home setup - reports Independent PLOF. Pt presents to acute OT demonstrating impaired ADL performance and functional mobility 2/2 decreased safety awareness, poor insight into deficits, and functional strength/balance deficits.   Upon arrival pt recently completed PT and attempting to exit bed - stating need to urinate. Pt perseverative on urge to urinate however unable to understand the purewick. Multimodal cues + increased time for sequencing t/o session. MAX A don B socks at bed level. MIN A for UBD seated EOB - improving to SBA for static sitting. MOD A sit<>stand, unable to sequence side steps. Pt would benefit from skilled OT to address noted impairments and functional limitations (see below for any additional details) in order to maximize safety and independence while minimizing falls risk and caregiver burden. Upon hospital discharge, recommend CIR to maximize pt safety and return to PLOF.     Follow Up Recommendations  CIR    Equipment Recommendations  Other (comment) (TBD at next venue of care)    Recommendations for Other Services        Precautions / Restrictions Precautions Precautions: Fall;Other (comment) (Delirium) Restrictions Weight Bearing Restrictions: No      Mobility Bed Mobility Overal bed mobility: Needs Assistance Bed Mobility: Supine to Sit;Sit to Supine     Supine to sit: Mod assist Sit to supine: Mod assist        Transfers Overall transfer level: Needs assistance Equipment used: 1 person hand held assist Transfers: Sit to/from Stand Sit to Stand: Mod assist;From elevated surface         General transfer comment: unable to sequence side steps    Balance Overall balance assessment: Needs assistance Sitting-balance support: Single extremity supported;Feet supported Sitting balance-Leahy Scale: Fair     Standing balance support: Bilateral upper extremity supported Standing balance-Leahy Scale: Poor                             ADL either performed or assessed with clinical judgement   ADL Overall ADL's : Needs assistance/impaired                                       General ADL Comments: MAX A don B socks at bed level. MIN A for UBD seated EOB - improving to SBA for static sitting                  Pertinent Vitals/Pain Pain Assessment: Faces Faces Pain Scale: No hurt     Hand Dominance     Extremity/Trunk Assessment Upper Extremity Assessment Upper Extremity Assessment: Generalized weakness   Lower Extremity Assessment Lower Extremity Assessment: Generalized weakness  Communication Communication Communication: No difficulties   Cognition Arousal/Alertness: Awake/alert Behavior During Therapy: Restless Overall Cognitive Status: Impaired/Different from baseline Area of Impairment: Attention;Memory;Following commands;Orientation;Safety/judgement;Problem solving                 Orientation Level: Disoriented to;Place;Time;Situation   Memory: Decreased recall of precautions;Decreased short-term memory Following Commands:  Follows one step commands inconsistently;Follows one step commands with increased time Safety/Judgement: Decreased awareness of safety;Decreased awareness of deficits   Problem Solving: Slow processing;Decreased initiation;Difficulty sequencing;Requires verbal cues;Requires tactile cues General Comments: Perseverative on urge to urinate however unable to understand the purewick. Multimodal cues + increased time for sequencing   General Comments  Spo2 stable in mid-high 90s on 2L [place] t/o session    Exercises Exercises: Other exercises Other Exercises Other Exercises: Pt educated re: OT role, DME recs, d/c recs, falls prevention, ECS Other Exercises: LBD, UBD, sup<>sit, sit<>stand, sitting/standing balance/tolerance   Shoulder Instructions      Home Living Family/patient expects to be discharged to:: Private residence Living Arrangements: Spouse/significant other                               Additional Comments: Poor historian - perseverative on urge to urinate      Prior Functioning/Environment Level of Independence: Independent        Comments: Poor historian - reports Independent        OT Problem List: Decreased strength;Decreased activity tolerance;Decreased range of motion;Impaired balance (sitting and/or standing);Decreased safety awareness;Decreased cognition      OT Treatment/Interventions: Self-care/ADL training;Therapeutic exercise;Energy conservation;DME and/or AE instruction;Cognitive remediation/compensation;Patient/family education;Balance training;Therapeutic activities    OT Goals(Current goals can be found in the care plan section) Acute Rehab OT Goals Patient Stated Goal: To return home OT Goal Formulation: With patient Time For Goal Achievement: 07/16/20 Potential to Achieve Goals: Good ADL Goals Pt Will Perform Grooming: with supervision;standing (c LRAD PRN) Pt Will Perform Lower Body Dressing: with modified independence;sit to/from stand  (c LRAD PRN) Pt Will Transfer to Toilet: ambulating;regular height toilet;with min guard assist (c LRAD PRN)  OT Frequency: Min 2X/week   Barriers to D/C: Inaccessible home environment             AM-PAC OT "6 Clicks" Daily Activity     Outcome Measure Help from another person eating meals?: A Little Help from another person taking care of personal grooming?: A Little Help from another person toileting, which includes using toliet, bedpan, or urinal?: A Lot Help from another person bathing (including washing, rinsing, drying)?: A Lot Help from another person to put on and taking off regular upper body clothing?: A Little Help from another person to put on and taking off regular lower body clothing?: A Lot 6 Click Score: 15   End of Session Equipment Utilized During Treatment: Oxygen Nurse Communication: Mobility status;Other (comment) (need for bed pan)  Activity Tolerance: Patient tolerated treatment well;Other (comment) (Limited by cognition) Patient left: in bed;with call bell/phone within reach;with bed alarm set  OT Visit Diagnosis: Other abnormalities of gait and mobility (R26.89)                Time: 6160-7371 OT Time Calculation (min): 16 min Charges:  OT General Charges $OT Visit: 1 Visit OT Evaluation $OT Eval Moderate Complexity: 1 Mod OT Treatments $Self Care/Home Management : 8-22 mins  Dessie Coma, M.S. OTR/L  07/02/20, 4:56 PM  ascom [phone]

## 2020-07-02 NOTE — Progress Notes (Signed)
Insulin pump, 2 small blankets,and wedding ring set -3 yellow colored rings, 1 with a clear stone noted in personal belongings. Personal items in 2 pt belonging bags, placed on the bed with the patient to await transport.

## 2020-07-02 NOTE — Progress Notes (Signed)
PT Cancellation Note  Patient Details Name: Ashley [name] MRN: [id number] DOB: 09/24/1965   Cancelled Treatment:    Reason Eval/Treat Not Completed: Other (comment).  Pt was unable to do therapy, having a meal.  Will return as pt can allow.   Ramond Dial 07/02/2020, 1:03 PM   Mee Hives, PT MS Acute Rehab Dept. Number: [place] and [place]

## 2020-07-03 DIAGNOSIS — G4733 Obstructive sleep apnea (adult) (pediatric): Secondary | ICD-10-CM

## 2020-07-03 DIAGNOSIS — G9341 Metabolic encephalopathy: Secondary | ICD-10-CM

## 2020-07-03 DIAGNOSIS — I10 Essential (primary) hypertension: Secondary | ICD-10-CM | POA: Diagnosis not present

## 2020-07-03 DIAGNOSIS — E0810 Diabetes mellitus due to underlying condition with ketoacidosis without coma: Secondary | ICD-10-CM | POA: Diagnosis not present

## 2020-07-03 DIAGNOSIS — K3184 Gastroparesis: Secondary | ICD-10-CM

## 2020-07-03 DIAGNOSIS — U071 COVID-19: Secondary | ICD-10-CM | POA: Diagnosis not present

## 2020-07-03 DIAGNOSIS — I739 Peripheral vascular disease, unspecified: Secondary | ICD-10-CM

## 2020-07-03 DIAGNOSIS — J441 Chronic obstructive pulmonary disease with (acute) exacerbation: Secondary | ICD-10-CM

## 2020-07-03 DIAGNOSIS — J4540 Moderate persistent asthma, uncomplicated: Secondary | ICD-10-CM

## 2020-07-03 LAB — CBC WITH DIFFERENTIAL/PLATELET
Abs Immature Granulocytes: 0.11 10*3/uL — ABNORMAL HIGH (ref 0.00–0.07)
Basophils Absolute: 0 10*3/uL (ref 0.0–0.1)
Basophils Relative: 0 %
Eosinophils Absolute: 0.2 10*3/uL (ref 0.0–0.5)
Eosinophils Relative: 1 %
HCT: 36.6 % (ref 36.0–46.0)
Hemoglobin: 11.5 g/dL — ABNORMAL LOW (ref 12.0–15.0)
Immature Granulocytes: 1 %
Lymphocytes Relative: 22 %
Lymphs Abs: 2.7 10*3/uL (ref 0.7–4.0)
MCH: 30.3 pg (ref 26.0–34.0)
MCHC: 31.4 g/dL (ref 30.0–36.0)
MCV: 96.6 fL (ref 80.0–100.0)
Monocytes Absolute: 0.9 10*3/uL (ref 0.1–1.0)
Monocytes Relative: 7 %
Neutro Abs: 8.3 10*3/uL — ABNORMAL HIGH (ref 1.7–7.7)
Neutrophils Relative %: 69 %
Platelets: 384 10*3/uL (ref 150–400)
RBC: 3.79 MIL/uL — ABNORMAL LOW (ref 3.87–5.11)
RDW: 14.9 % (ref 11.5–15.5)
WBC: 12.3 10*3/uL — ABNORMAL HIGH (ref 4.0–10.5)
nRBC: 0 % (ref 0.0–0.2)

## 2020-07-03 LAB — TSH: TSH: 18.41 u[IU]/mL — ABNORMAL HIGH (ref 0.350–4.500)

## 2020-07-03 LAB — GLUCOSE, CAPILLARY
Glucose-Capillary: 138 mg/dL — ABNORMAL HIGH (ref 70–99)
Glucose-Capillary: 142 mg/dL — ABNORMAL HIGH (ref 70–99)
Glucose-Capillary: 146 mg/dL — ABNORMAL HIGH (ref 70–99)
Glucose-Capillary: 156 mg/dL — ABNORMAL HIGH (ref 70–99)
Glucose-Capillary: 255 mg/dL — ABNORMAL HIGH (ref 70–99)
Glucose-Capillary: 294 mg/dL — ABNORMAL HIGH (ref 70–99)

## 2020-07-03 LAB — PHOSPHORUS: Phosphorus: 3.1 mg/dL (ref 2.5–4.6)

## 2020-07-03 LAB — BASIC METABOLIC PANEL
Anion gap: 14 (ref 5–15)
BUN: 28 mg/dL — ABNORMAL HIGH (ref 6–20)
CO2: 27 mmol/L (ref 22–32)
Calcium: 9 mg/dL (ref 8.9–10.3)
Chloride: 104 mmol/L (ref 98–111)
Creatinine, Ser: 0.81 mg/dL (ref 0.44–1.00)
GFR, Estimated: >60 mL/min (ref >60)
Glucose, Bld: 176 mg/dL — ABNORMAL HIGH (ref 70–99)
Potassium: 3.9 mmol/L (ref 3.5–5.1)
Sodium: 145 mmol/L (ref 135–145)

## 2020-07-03 LAB — MAGNESIUM: Magnesium: 1.9 mg/dL (ref 1.7–2.4)

## 2020-07-03 MED ORDER — CHLORHEXIDINE GLUCONATE CLOTH 2 % EX PADS
6.0000 | MEDICATED_PAD | Freq: Every day | CUTANEOUS | Status: DC
  Administered 2020-07-03 – 2020-07-06 (×4): 6 via TOPICAL

## 2020-07-03 MED ORDER — ALPRAZOLAM 0.5 MG PO TABS
0.5000 mg | ORAL_TABLET | Freq: Three times a day (TID) | ORAL | Status: DC | PRN
  Administered 2020-07-03 – 2020-07-06 (×4): 0.5 mg via ORAL
  Filled 2020-07-03 (×4): qty 1

## 2020-07-03 MED ORDER — FAMOTIDINE 20 MG PO TABS
20.0000 mg | ORAL_TABLET | Freq: Two times a day (BID) | ORAL | Status: DC
  Administered 2020-07-03 – 2020-07-06 (×6): 20 mg via ORAL
  Filled 2020-07-03 (×6): qty 1

## 2020-07-03 NOTE — Progress Notes (Signed)
Inpatient Rehab Admissions Coordinator Note:   Per PT/OT recommendations, pt was screened for CIR candidacy by Gayland Curry, MS, CCC-SLP.  At this time we are not recommending an inpatient rehab consult. Noted pt COVID + (original date not confirmed in chart, but positive on 06/21/20).  Patients are eligible to be considered for admit to CIR when cleared from airborne precautions by acute MD or Infectious Disease regardless of onset date. Otherwise, they will need to be >20 days from their positive test with recovery/improvement in symptoms or 2 negative tests.  Please contact me with questions.    Gayland Curry, [place], [place] Admissions Coordinator [phone] 07/03/20 9:30 AM

## 2020-07-03 NOTE — Progress Notes (Signed)
PHARMACY CONSULT NOTE - FOLLOW UP  Pharmacy Consult for Electrolyte Monitoring and Replacement   Recent Labs: Potassium (mmol/L)  Date Value  07/03/2020 3.9   Magnesium (mg/dL)  Date Value  07/03/2020 1.9   Calcium (mg/dL)  Date Value  07/03/2020 9.0   Albumin (g/dL)  Date Value  06/26/2020 2.3 (L)  01/09/2019 4.5   Phosphorus (mg/dL)  Date Value  07/03/2020 3.1   Sodium (mmol/L)  Date Value  07/03/2020 145  01/30/2019 142    Assessment: 55 year old female admitted with metabolic encephalopathy in setting of Covid and DKA/HHS. Patient required intubation 1/13 and was subsequently extubated 1/20 and transferred to 1C yesterday. Pharmacy has been asked to replace electrolytes.  Goal of Therapy:  Electrolytes WNL  Plan:   No electrolyte replacement warranted today   Follow up with morning labs  Vallery Sa, PharmD Clinical Pharmacist 07/03/2020

## 2020-07-03 NOTE — TOC Initial Note (Signed)
Transition of Care [facility]) - Initial/Assessment Note    Patient Details  Name: Ashley [name] MRN: [id number] Date of Birth: [birth year]  Transition of Care [facility]) CM/SW Contact:    Ova Freshwater Phone Number: [phone] 07/03/2020, 1:43 PM  Clinical Narrative:                  Attending reach out to CSW for update on patient status.  Attending stated the PT/OT initially recommended CIR but patient is COVID + and CIR stated they would not have placement available for several days until patient is past COVID quarantine or has two negative COVID tests.  Attending requested CSW begin SNF search.  CSW stated that I would begin SNF search but did relay current barriers to bed offers for patients that are COVID+, including limited admissions, possible staff issues and not admitting new patients.  Attending verbalized understanding.  CSW called the patient and was unable to reach her.  CSW contacted patient's Sharmin, Foulk (Spouse)  [phone] for an update. CSW explained TOC role in patient care and SNF placement process and estimated timeline.  CSW spoke went over possible barriers for placement due to COVID+diagnosis.  CSW gave Mr. Naraine Mr. Maxson verbalized understanding.  CSW gave Mr. Franshesca [place] information.  Fl2 signed, PASRR [id number], sent to SNFs for placement.  Expected Discharge Plan: Skilled Nursing Facility Barriers to Discharge: SNF Pending bed offer,SNF Pending discharge summary,SNF Covid   Patient Goals and CMS Choice   CMS Medicare.gov Compare Post Acute Care list provided to::  Javae, Braaten (Spouse)   [phone] ([place])) Choice offered to / list presented to : Spouse  Expected Discharge Plan and Services Expected Discharge Plan: [place] In-house Referral: Clinical Social Work   Post Acute Care Choice: [place] Living arrangements for the past 2 months: [place]                                       Prior Living Arrangements/Services Living arrangements for the past 2 months: Single Family Home Lives with:: Spouse Patient language and need for interpreter reviewed:: Yes Do you feel safe going back to the place where you live?: Yes      Need for Family Participation in Patient Care: Yes (Comment) Care giver support system in place?: Yes (comment)   Criminal Activity/Legal Involvement Pertinent to Current Situation/Hospitalization: No - Comment as needed  Activities of Daily Living Home Assistive Devices/Equipment: CBG Meter,Insulin Pump ADL Screening (condition at time of admission) Patient's cognitive ability adequate to safely complete daily activities?: No Is the patient deaf or have difficulty hearing?: No Does the patient have difficulty seeing, even when wearing glasses/contacts?: No Does the patient have difficulty concentrating, remembering, or making decisions?: No Patient able to express need for assistance with ADLs?: Yes Does the patient have difficulty dressing or bathing?: No Independently performs ADLs?: Yes (appropriate for developmental age) Does the patient have difficulty walking or climbing stairs?: No Weakness of Legs: Both Weakness of Arms/Hands: None  Permission Sought/Granted Permission sought to share information with : Facility Sport and exercise psychologist Permission granted to share information with : Yes, Verbal Permission Granted  Share Information with NAME: Alexiah, Koroma (Spouse)   [phone] ([place])           Emotional Assessment Appearance:: Appears stated age Attitude/Demeanor/Rapport: Unable to Assess Affect (typically observed): Unable to Assess Orientation: : Oriented to Self,Oriented  to Place,Oriented to Situation Alcohol / Substance Use: Not Applicable Psych Involvement: No (comment)  Admission diagnosis:  Dehydration [E86.0] DKA (diabetic ketoacidosis) ([place]) [E11.10] Hypoxia [R09.02] Generalized weakness  [R53.1] Fever, unspecified fever cause [R50.9] Diabetic ketoacidosis without coma associated with type 2 diabetes mellitus ([place]) [E11.10] Sepsis, due to unspecified organism, unspecified whether acute organ dysfunction present ([place]) [A41.9] Pneumonia due to COVID-19 virus [U07.1, J12.82] Patient Active Problem List   Diagnosis Date Noted  . Endotracheal tube present   . DKA (diabetic ketoacidosis) ([place]) 06/21/2020  . Rheumatoid arthritis ([place]) 06/21/2020  . Pneumonia due to COVID-19 virus 06/21/2020  . Tobacco abuse 02/24/2020  . Claudication in peripheral vascular disease ([place]) 01/01/2019  . Essential hypertension 12/25/2018  . Hyperlipidemia 12/25/2018  . Peripheral arterial disease ([place]) 12/25/2018  . Hypothyroidism 12/09/2018  . Hypertensive disorder 12/09/2018  . OSA on CPAP 10/08/2018  . Loud snoring 05/19/2018  . OSA and COPD overlap syndrome ([place]) 04/02/2018  . Excessive daytime sleepiness 04/02/2018  . Delayed sleep phase syndrome 04/02/2018  . Moderate persistent asthma 04/02/2018  . Dyspnea and respiratory abnormality 04/26/2015  . Chest tightness 04/26/2015  . Hoarseness of voice 04/26/2015  . Nausea with vomiting 10/22/2012  . Diarrhea 10/22/2012  . Gastroparesis 03/22/2009  . DYSPHAGIA 03/22/2009  . DIAB W/O MENTION COMP TYPE I [JUV TYPE] UNCNTRL 02/28/2009  . GERD 02/28/2009  . DYSPHAGIA UNSPECIFIED 02/28/2009  . ABDOMINAL PAIN-EPIGASTRIC 02/28/2009   PCP:  Prince Solian, MD Pharmacy:   [id number] [place], [place] [phone] [facility] [place] [address] [place] [address] Phone: [phone] Fax: [phone]  [facility] - [place], [place] [address] Phone: [phone] Fax: [phone]     Social Determinants of Health (SDOH) Interventions    Readmission Risk Interventions No flowsheet data found.

## 2020-07-03 NOTE — Progress Notes (Addendum)
1        [place] [facility] [place] NAME: Ashley [name]    MR#:  [id number]  DATE OF BIRTH:  May 09, 1966  SUBJECTIVE:  CHIEF COMPLAINT:   Chief Complaint  Patient presents with  . Weakness  . Covid Positive  Feels very weak, coughing REVIEW OF SYSTEMS:  Review of Systems  Constitutional: Positive for malaise/fatigue. Negative for diaphoresis, fever and weight loss.  HENT: Negative for ear discharge, ear pain, hearing loss, nosebleeds, sore throat and tinnitus.   Eyes: Negative for blurred vision and pain.  Respiratory: Positive for cough and shortness of breath. Negative for hemoptysis and wheezing.   Cardiovascular: Negative for chest pain, palpitations, orthopnea and leg swelling.  Gastrointestinal: Negative for abdominal pain, blood in stool, constipation, diarrhea, heartburn, nausea and vomiting.  Genitourinary: Negative for dysuria, frequency and urgency.  Musculoskeletal: Negative for back pain and myalgias.  Skin: Negative for itching and rash.  Neurological: Negative for dizziness, tingling, tremors, focal weakness, seizures, weakness and headaches.  Psychiatric/Behavioral: Negative for depression. The patient is not nervous/anxious.    DRUG ALLERGIES:   Allergies  Allergen Reactions  . Mirtazapine Rash    And hives  . Clindamycin Hives  . Other Hives    Tylox   . Sulfonamide Derivatives Hives  . Lipitor [Atorvastatin] Other (See Comments)    MUSCLE ACHES  . Restasis [Cyclosporine]     Pt stated, "My eyes turned red on the inside and outside of eye; burning sensation"  . Topamax [Topiramate] Itching   VITALS:  Blood pressure 129/64, pulse 94, temperature 98.5 F (36.9 C), resp. rate 18, height 4' 11.5" (1.511 m), weight 61.1 kg, SpO2 100 %. PHYSICAL EXAMINATION:  Physical Exam HENT:     Head: Normocephalic and atraumatic.  Eyes:     Extraocular Movements: EOM normal.     Conjunctiva/sclera: Conjunctivae normal.     Pupils: Pupils are  equal, round, and reactive to light.  Neck:     Thyroid: No thyromegaly.     Trachea: No tracheal deviation.  Cardiovascular:     Rate and Rhythm: Normal rate and regular rhythm.     Heart sounds: Normal heart sounds.  Pulmonary:     Effort: Pulmonary effort is normal. No respiratory distress.     Breath sounds: Examination of the right-lower field reveals rhonchi. Examination of the left-lower field reveals rhonchi. Decreased breath sounds and rhonchi present. No wheezing.  Chest:     Chest wall: No tenderness.  Abdominal:     General: Bowel sounds are normal. There is no distension.     Palpations: Abdomen is soft.     Tenderness: There is no abdominal tenderness.  Musculoskeletal:        General: Normal range of motion.     Cervical back: Normal range of motion and neck supple.  Skin:    General: Skin is warm and dry.     Findings: No rash.  Neurological:     Mental Status: She is alert and oriented to person, place, and time.     Cranial Nerves: No cranial nerve deficit.   Has PICC line in right forearm placed on 1/13 LABORATORY PANEL:  Female CBC Recent Labs  Lab 07/03/20 0555  WBC 12.3*  HGB 11.5*  HCT 36.6  PLT 384   ------------------------------------------------------------------------------------------------------------------ Chemistries  Recent Labs  Lab 07/03/20 0555  NA 145  K 3.9  CL 104  CO2 27  GLUCOSE 176*  BUN 28*  CREATININE  0.81  CALCIUM 9.0  MG 1.9   RADIOLOGY:  No results found. ASSESSMENT AND PLAN:  55 year old female with a known history of DM complicated by gastroparesis, neuropathy, peripheral artery disease on insulin pump for several years, OSA on CPAP, PAD with history of biiliac stents, moderate persistent asthma, OSA on CPAP, obesity, nicotine dependence, HTN, hypothyroidism, osteoporosis and chronic pain admitted for acute metabolic encephalopathy in the setting of multifocal pneumonia due to COVID and DKA.  She required  intubation on 1/13 and was extubated on 1/20.  Transferred to 1C and hospitalist service on 1/23.  1/23 -> awake and alert.  Very weak.  Waiting for disposition to SNF/CIR.  She has completed course of remdesivir and steroids.  She also completed 7-day course of Unasyn.  Off insulin drip.  CT head and MRI brain were negative for any acute pathology.    DKA (diabetic ketoacidosis) ([place]) -  Resolved  Severe COVID-19 infection, ARDS and pneumonia/pneumonitis COPD exacerbation Secondary bacterial infection -  Treated and completed course of remdesivir, steroids and IV Unasyn Now resolved  Acute metabolic encephalopathy, suspect COVID encephalopathy -  CT head and MRI brain negative.  This is resolved.  She is mentating back to her baseline    Gastroparesis -  Chronic and stable    Moderate persistent asthma - Continue home inhalers with albuterol as needed    OSA on CPAP - Nighttime CPAP if desired    Essential hypertension - Continue metoprolol    Peripheral arterial disease (HCC) - Continue antiplatelets and statins    Hypothyroidism - Continue levothyroxine, check TSH  1/23 - I have asked nurse to evaluate her PICC line and see if we can get it removed considering it is in place since 1/13.  She may need peripheral IV before we can remove PICC line.  Difficult disposition considering her COVID positive status and requiring SNF/CIR  Body mass index is 26.75 kg/m.  Net IO Since Admission: -2,015.58 mL [07/03/20 1256]  Pressure Injury 06/26/20 Heel Right Deep Tissue Pressure Injury - Purple or maroon localized area of discolored intact skin or blood-filled blister due to damage of underlying soft tissue from pressure and/or shear. (Active)  06/26/20 0805  Location: Heel  Location Orientation: Right  Staging: Deep Tissue Pressure Injury - Purple or maroon localized area of discolored intact skin or blood-filled blister due to damage of underlying soft tissue from  pressure and/or shear.  Wound Description (Comments):   Present on Admission:      Status is: Inpatient  Remains inpatient appropriate because:Unsafe d/c plan   Dispo: The patient is from: Home              Anticipated d/c is to: SNF              Anticipated d/c date is: > 3 days              Patient currently is medically stable to d/c.   Difficult to place patient Yes PT/OT recommends CIR but considering her COVID positive on 1/11, I do not believe she will be able to get to CIR for at least 21 days since positive test, or 2 negative test or improvement in her symptoms per CIR admission coordinator.  I have asked TOC team to look for SNF as an alternative disposition option.    DVT prophylaxis:       enoxaparin (LOVENOX) injection 40 mg Start: 06/25/20 1000     Family Communication:  "discussed with patient")  All the records are reviewed and case discussed with Care Management/Social Worker. Management plans discussed with the patient, nursing and they are in agreement.  CODE STATUS: Full Code Level of care: Med-Surg  TOTAL TIME TAKING CARE OF THIS PATIENT: 35 minutes.   More than 50% of the time was spent in counseling/coordination of care: YES  POSSIBLE D/C IN 3-4 DAYS, DEPENDING ON CLINICAL CONDITION.   Max Sane M.D on 07/03/2020 at 12:56 PM  [facility]   CC: Primary care physician; Prince Solian, MD  Note: This dictation was prepared with Dragon dictation along with smaller phrase technology. Any transcriptional errors that result from this process are unintentional.

## 2020-07-03 NOTE — Hospital Course (Addendum)
55 year old female with a known history of DM complicated by gastroparesis, neuropathy, peripheral artery disease on insulin pump for several years, OSA on CPAP, PAD with history of biiliac stents, moderate persistent asthma, OSA on CPAP, obesity, nicotine dependence, HTN, hypothyroidism, osteoporosis and chronic pain admitted for acute metabolic encephalopathy in the setting of multifocal pneumonia due to COVID and DKA.  She required intubation on 1/13 and was extubated on 1/20.    Transferred to 1C and hospitalist service on 1/23.  Off insulin drip.  Completed treated for Covid-19 with remdesivir and steroids, in addition to 7 day course of Unasyn.   CT head and MRI brain were negative for any acute pathology.  Patient began working with PT, very weak requiring 2 people assist, continued to be confused and agitated at times, requiring prn haldol and Air cabin crew.  She was weaned off oxygen and respiratory status remained stable.    Hospitalization prolonged with disposition challenges and inability to find right SNF/rehab setting for the patient.  Ultimately, patient's husband was able to arrange 24/7 aides and requested to take patient home with home health services.

## 2020-07-03 NOTE — NC FL2 (Signed)
[place] LEVEL OF CARE SCREENING TOOL     IDENTIFICATION  Patient Name: Ashley [name] Birthdate: 1965/11/27 Sex: female Admission Date (Current Location): 06/20/2020  [place] and [place] Number:  Engineering geologist and Address:  [facility], [address] [place], [place] [address]      Provider Number: [id number]  Attending Physician Name and Address:  Max Sane, MD  Relative Name and Phone Number:  Catricia, [facility])   [phone] [facility])    Current Level of Care: SNF Recommended Level of Care: [place] Prior Approval Number:    Date Approved/Denied:   PASRR Number: [id number]  Discharge Plan: SNF    Current Diagnoses: Patient Active Problem List   Diagnosis Date Noted  . Endotracheal tube present   . DKA (diabetic ketoacidosis) ([place]) 06/21/2020  . Rheumatoid arthritis ([place]) 06/21/2020  . Pneumonia due to COVID-19 virus 06/21/2020  . Tobacco abuse 02/24/2020  . Claudication in peripheral vascular disease ([place]) 01/01/2019  . Essential hypertension 12/25/2018  . Hyperlipidemia 12/25/2018  . Peripheral arterial disease ([place]) 12/25/2018  . Hypothyroidism 12/09/2018  . Hypertensive disorder 12/09/2018  . OSA on CPAP 10/08/2018  . Loud snoring 05/19/2018  . OSA and COPD overlap syndrome ([place]) 04/02/2018  . Excessive daytime sleepiness 04/02/2018  . Delayed sleep phase syndrome 04/02/2018  . Moderate persistent asthma 04/02/2018  . Dyspnea and respiratory abnormality 04/26/2015  . Chest tightness 04/26/2015  . Hoarseness of voice 04/26/2015  . Nausea with vomiting 10/22/2012  . Diarrhea 10/22/2012  . Gastroparesis 03/22/2009  . DYSPHAGIA 03/22/2009  . DIAB W/O MENTION COMP TYPE I [JUV TYPE] UNCNTRL 02/28/2009  . GERD 02/28/2009  . DYSPHAGIA UNSPECIFIED 02/28/2009  . ABDOMINAL PAIN-EPIGASTRIC 02/28/2009    Orientation RESPIRATION BLADDER Height & Weight     Self,Time,Situation,Place   Normal Continent Weight: 134 lb 11.2 oz (61.1 kg) Height:  4' 11.5" (151.1 cm)  BEHAVIORAL SYMPTOMS/MOOD NEUROLOGICAL BOWEL NUTRITION STATUS      Continent Diet  AMBULATORY STATUS COMMUNICATION OF NEEDS Skin   Extensive Assist Verbally Normal                       Personal Care Assistance Level of Assistance  Bathing,Feeding,Dressing,Total care Bathing Assistance: Limited assistance Feeding assistance: Independent Dressing Assistance: Limited assistance Total Care Assistance: Limited assistance   Functional Limitations Info  Sight,Hearing,Speech Sight Info: Adequate Hearing Info: Adequate Speech Info: Adequate    SPECIAL CARE FACTORS FREQUENCY       PT Min 2X/week    OT Min 2x/week              Contractures Contractures Info: Not present    Additional Factors Info                  Current Medications (07/03/2020):  This is the current hospital active medication list Current Facility-Administered Medications  Medication Dose Route Frequency Provider Last Rate Last Admin  . 0.9 %  sodium chloride infusion   Intravenous PRN Rust-Chester, Huel Cote, NP   Stopped at 07/02/20 0915  . acetaminophen (TYLENOL) suppository 650 mg  650 mg Rectal Q6H PRN Ralene Muskrat B, MD   650 mg at 06/23/20 1149  . acetaminophen (TYLENOL) tablet 650 mg  650 mg Oral Q6H PRN Freddi Starr, MD      . ALPRAZolam Duanne Moron) tablet 0.5 mg  0.5 mg Oral TID PRN Rust-Chester, Toribio Harbour L, NP   0.5 mg at 07/03/20 0541  .  dextrose 50 % solution 0-50 mL  0-50 mL Intravenous PRN Paulette Blanch, MD      . docusate (COLACE) 50 MG/5ML liquid 100 mg  100 mg Oral BID Freddi Starr, MD   100 mg at 07/03/20 1252  . enoxaparin (LOVENOX) injection 40 mg  40 mg Subcutaneous Q24H Rust-Chester, Britton L, NP   40 mg at 07/03/20 0948  . famotidine (PEPCID) tablet 20 mg  20 mg Oral BID Dallie Piles, [facility]      . feeding supplement (NEPRO CARB STEADY) liquid 237 mL  237 mL Oral TID BM Tyler Pita, MD   237 mL at 07/03/20 0955  . insulin aspart (novoLOG) injection 0-15 Units  0-15 Units Subcutaneous Q4H Awilda Bill, NP   8 Units at 07/03/20 1253  . insulin aspart (novoLOG) injection 7 Units  7 Units Subcutaneous TID WC Ottie Glazier, MD   7 Units at 07/03/20 1253  . insulin detemir (LEVEMIR) injection 12 Units  12 Units Subcutaneous BID Ottie Glazier, MD   12 Units at 07/02/20 2127  . levothyroxine (SYNTHROID) tablet 112 mcg  112 mcg Oral Q0600 Freddi Starr, MD   112 mcg at 07/03/20 0541  . metoprolol tartrate (LOPRESSOR) tablet 12.5 mg  12.5 mg Oral BID Freda Jackson B, MD   12.5 mg at 07/03/20 0949  . multivitamin with minerals tablet 1 tablet  1 tablet Oral Daily Freddi Starr, MD   1 tablet at 07/03/20 0949  . polyethylene glycol (MIRALAX / GLYCOLAX) packet 17 g  17 g Oral Daily PRN Tonye Royalty, NP      . QUEtiapine (SEROQUEL) tablet 50 mg  50 mg Oral QHS Freddi Starr, MD   50 mg at 07/02/20 2125  . sodium chloride flush (NS) 0.9 % injection 10-40 mL  10-40 mL Intracatheter Q12H Flora Lipps, MD   20 mL at 07/03/20 0955  . sodium chloride flush (NS) 0.9 % injection 10-40 mL  10-40 mL Intracatheter PRN Flora Lipps, MD   10 mL at 06/24/20 2109  . thiamine tablet 100 mg  100 mg Oral Daily Donnetta Simpers, MD   100 mg at 07/03/20 3335     Discharge Medications: Please see discharge summary for a list of discharge medications.  Relevant Imaging Results:  Relevant Lab Results:   Additional Information SS#[id number]  Adelene Amas, LCSWA

## 2020-07-04 DIAGNOSIS — R451 Restlessness and agitation: Secondary | ICD-10-CM | POA: Diagnosis not present

## 2020-07-04 DIAGNOSIS — E039 Hypothyroidism, unspecified: Secondary | ICD-10-CM | POA: Diagnosis not present

## 2020-07-04 DIAGNOSIS — E0810 Diabetes mellitus due to underlying condition with ketoacidosis without coma: Secondary | ICD-10-CM | POA: Diagnosis not present

## 2020-07-04 DIAGNOSIS — K3184 Gastroparesis: Secondary | ICD-10-CM | POA: Diagnosis not present

## 2020-07-04 LAB — CBC WITH DIFFERENTIAL/PLATELET
Abs Immature Granulocytes: 0.1 10*3/uL — ABNORMAL HIGH (ref 0.00–0.07)
Basophils Absolute: 0.1 10*3/uL (ref 0.0–0.1)
Basophils Relative: 0 %
Eosinophils Absolute: 0.2 10*3/uL (ref 0.0–0.5)
Eosinophils Relative: 1 %
HCT: 38.8 % (ref 36.0–46.0)
Hemoglobin: 12.6 g/dL (ref 12.0–15.0)
Immature Granulocytes: 1 %
Lymphocytes Relative: 24 %
Lymphs Abs: 3 10*3/uL (ref 0.7–4.0)
MCH: 31.1 pg (ref 26.0–34.0)
MCHC: 32.5 g/dL (ref 30.0–36.0)
MCV: 95.8 fL (ref 80.0–100.0)
Monocytes Absolute: 0.9 10*3/uL (ref 0.1–1.0)
Monocytes Relative: 7 %
Neutro Abs: 8.2 10*3/uL — ABNORMAL HIGH (ref 1.7–7.7)
Neutrophils Relative %: 67 %
Platelets: 226 10*3/uL (ref 150–400)
RBC: 4.05 MIL/uL (ref 3.87–5.11)
RDW: 14.6 % (ref 11.5–15.5)
WBC: 12.4 10*3/uL — ABNORMAL HIGH (ref 4.0–10.5)
nRBC: 0 % (ref 0.0–0.2)

## 2020-07-04 LAB — GLUCOSE, CAPILLARY
Glucose-Capillary: 149 mg/dL — ABNORMAL HIGH (ref 70–99)
Glucose-Capillary: 121 mg/dL — ABNORMAL HIGH (ref 70–99)
Glucose-Capillary: 173 mg/dL — ABNORMAL HIGH (ref 70–99)
Glucose-Capillary: 279 mg/dL — ABNORMAL HIGH (ref 70–99)
Glucose-Capillary: 209 mg/dL — ABNORMAL HIGH (ref 70–99)
Glucose-Capillary: 101 mg/dL — ABNORMAL HIGH (ref 70–99)

## 2020-07-04 LAB — BASIC METABOLIC PANEL
Anion gap: 13 (ref 5–15)
BUN: 18 mg/dL (ref 6–20)
CO2: 25 mmol/L (ref 22–32)
Calcium: 8.8 mg/dL — ABNORMAL LOW (ref 8.9–10.3)
Chloride: 101 mmol/L (ref 98–111)
Creatinine, Ser: 0.68 mg/dL (ref 0.44–1.00)
GFR, Estimated: >60 mL/min (ref >60)
Glucose, Bld: 103 mg/dL — ABNORMAL HIGH (ref 70–99)
Potassium: 3.8 mmol/L (ref 3.5–5.1)
Sodium: 139 mmol/L (ref 135–145)

## 2020-07-04 LAB — MAGNESIUM: Magnesium: 1.8 mg/dL (ref 1.7–2.4)

## 2020-07-04 LAB — TSH: TSH: 18.77 u[IU]/mL — ABNORMAL HIGH (ref 0.350–4.500)

## 2020-07-04 LAB — PHOSPHORUS: Phosphorus: 3.8 mg/dL (ref 2.5–4.6)

## 2020-07-04 MED ORDER — METOPROLOL SUCCINATE ER 50 MG PO TB24
50.0000 mg | ORAL_TABLET | Freq: Every day | ORAL | Status: DC
  Administered 2020-07-05 – 2020-07-06 (×2): 50 mg via ORAL
  Filled 2020-07-04 (×2): qty 1

## 2020-07-04 MED ORDER — HALOPERIDOL LACTATE 5 MG/ML IJ SOLN
1.0000 mg | Freq: Four times a day (QID) | INTRAMUSCULAR | Status: DC | PRN
  Administered 2020-07-05: 1 mg via INTRAVENOUS
  Filled 2020-07-04: qty 1

## 2020-07-04 MED ORDER — PANTOPRAZOLE SODIUM 40 MG PO TBEC
40.0000 mg | DELAYED_RELEASE_TABLET | Freq: Every day | ORAL | Status: DC
  Administered 2020-07-04 – 2020-07-06 (×3): 40 mg via ORAL
  Filled 2020-07-04 (×3): qty 1

## 2020-07-04 MED ORDER — ROSUVASTATIN CALCIUM 20 MG PO TABS
20.0000 mg | ORAL_TABLET | Freq: Every day | ORAL | Status: DC
  Administered 2020-07-04 – 2020-07-05 (×2): 20 mg via ORAL
  Filled 2020-07-04 (×2): qty 1

## 2020-07-04 MED ORDER — ONDANSETRON HCL 4 MG/2ML IJ SOLN
4.0000 mg | Freq: Four times a day (QID) | INTRAMUSCULAR | Status: DC | PRN
  Administered 2020-07-04: 14:00:00 4 mg via INTRAVENOUS
  Filled 2020-07-04: qty 2

## 2020-07-04 MED ORDER — METOPROLOL SUCCINATE ER 25 MG PO TB24
25.0000 mg | ORAL_TABLET | Freq: Once | ORAL | Status: AC
  Administered 2020-07-04: 25 mg via ORAL
  Filled 2020-07-04: qty 1

## 2020-07-04 MED ORDER — CLOPIDOGREL BISULFATE 75 MG PO TABS
75.0000 mg | ORAL_TABLET | Freq: Every day | ORAL | Status: DC
  Administered 2020-07-04 – 2020-07-06 (×3): 75 mg via ORAL
  Filled 2020-07-04 (×3): qty 1

## 2020-07-04 NOTE — Progress Notes (Signed)
Occupational Therapy Treatment Patient Details Name: Ashley [name] MRN: [id number] DOB: 02-09-1966 Today's Date: 07/04/2020    History of present illness Pt  is a 55 y.o. female with multiple medical history significant for Graves Dis., depression, Diabetes complicated by gastroparesis, esophageal stricture, and neuropathy and peripheral artery disease on insulin pump for several years, OSA on CPAP, PAD with history of biiliac stents, moderate persistent asthma, obesity, nicotine dependence, HTN, hypothyroidism, osteoporosis and chronic pain and was brought in to the emergency room with AMS. intubated on 06/23/2020; extubated 06/30/2020. Covid Positive. Encephalopathy in the setting of COVID-19 Pneumonia & DKA/HHS.   OT comments  Ashley [name] was seen for OT treatment on this date. Upon arrival to room pt reclined in bed eating breakfast - pt noted to be eating applesauce with the lid still 60% on and tinfoil in applesauce, pt unaware. Pt startles easily and unable to answer orientation questions - states "I know I'm not home." When asked what pt wanted to drink she stated "vodka."  Pt requires SETUP & CGA tooth brushing seated EOB. MOD A sit<>stand, tolerates ~30 seconds standing x2 trials. MIN A for LBD at bed level. SETUP self-feeding at bed level. Pt making good progress toward goals. Pt continues to benefit from skilled OT services to maximize return to PLOF and minimize risk of future falls, injury, caregiver burden, and readmission. Will continue to follow POC. Discharge recommendation remains appropriate.    Follow Up Recommendations  CIR (CIR denied, proceeding with SNF)   Equipment Recommendations  Other (comment) (TBD at next venue of care)    Recommendations for Other Services      Precautions / Restrictions Precautions Precautions: Fall Restrictions Weight Bearing Restrictions: No       Mobility Bed Mobility Overal bed mobility: Needs Assistance Bed Mobility: Supine to  Sit;Sit to Supine     Supine to sit: Min guard Sit to supine: Min assist      Transfers Overall transfer level: Needs assistance Equipment used: 1 person hand held assist Transfers: Sit to/from Stand Sit to Stand: Mod assist              Balance Overall balance assessment: Needs assistance Sitting-balance support: Single extremity supported Sitting balance-Leahy Scale: Fair     Standing balance support: Bilateral upper extremity supported Standing balance-Leahy Scale: Poor                             ADL either performed or assessed with clinical judgement   ADL Overall ADL's : Needs assistance/impaired                                       General ADL Comments: SETUP & CGA tooth brushing seated EOB. MIN A for LBD at bed level. SETUP self-feeding at bed level - pt noted to be eating applesauce with the lid still 60% on and tinfoil noted in applesauce               Cognition Arousal/Alertness: Awake/alert Behavior During Therapy: Restless Overall Cognitive Status: Impaired/Different from baseline Area of Impairment: Problem solving;Awareness;Safety/judgement;Following commands;Memory;Attention;Orientation                 Orientation Level: Disoriented to;Place;Time;Situation Current Attention Level: Selective Memory: Decreased recall of precautions;Decreased short-term memory Following Commands: Follows one step commands inconsistently;Follows one step commands with increased time Safety/Judgement: Decreased  awareness of safety;Decreased awareness of deficits Awareness: Intellectual Problem Solving: Slow processing;Requires verbal cues;Requires tactile cues General Comments: Pt startles easily and unable to answer questions - states "I know I'm not home." When asked what pt wanted to drink she stated "vodka"        Exercises Exercises: Other exercises Other Exercises Other Exercises: Pt educated re: DME recs, d/c recs,  falls prevention Other Exercises: LBD, sup<>sit, sit<>stand, sitting/standing balance/tolerance, tooth brushing   Shoulder Instructions       General Comments HR elevated 120s in standing    Pertinent Vitals/ Pain       Pain Assessment: No/denies pain         Frequency  Min 2X/week        Progress Toward Goals  OT Goals(current goals can now be found in the care plan section)  Progress towards OT goals: Progressing toward goals  Acute Rehab OT Goals Patient Stated Goal: get stronger OT Goal Formulation: With patient Time For Goal Achievement: 07/16/20 Potential to Achieve Goals: Good ADL Goals Pt Will Perform Grooming: with supervision;standing Pt Will Perform Lower Body Dressing: with modified independence;sit to/from stand Pt Will Transfer to Toilet: ambulating;regular height toilet;with min guard assist  Plan Frequency remains appropriate;Discharge plan remains appropriate       AM-PAC OT "6 Clicks" Daily Activity     Outcome Measure   Help from another person eating meals?: A Little Help from another person taking care of personal grooming?: A Little Help from another person toileting, which includes using toliet, bedpan, or urinal?: A Lot Help from another person bathing (including washing, rinsing, drying)?: A Lot Help from another person to put on and taking off regular upper body clothing?: A Little Help from another person to put on and taking off regular lower body clothing?: A Lot 6 Click Score: 15    End of Session Equipment Utilized During Treatment: Oxygen  OT Visit Diagnosis: Other abnormalities of gait and mobility (R26.89)   Activity Tolerance Patient tolerated treatment well;Other (comment)   Patient Left in bed;with call bell/phone within reach;with bed alarm set   Nurse Communication          Time: 1100-1126 OT Time Calculation (min): 26 min  Charges: OT General Charges $OT Visit: 1 Visit OT Treatments $Self Care/Home  Management : 23-37 mins  Dessie Coma, M.S. OTR/L  07/04/20, 12:55 PM  ascom [phone]

## 2020-07-04 NOTE — Progress Notes (Signed)
Patient placed on low bed with floor mats. Madlyn Frankel, RN

## 2020-07-04 NOTE — Progress Notes (Signed)
Physical Therapy Treatment Patient Details Name: Ashley [name] MRN: [id number] DOB: Jan 17, 1966 Today's Date: 07/04/2020    History of Present Illness Pt  is a 55 y.o. female with multiple medical history significant for Graves Dis., depression, Diabetes complicated by gastroparesis, esophageal stricture, and neuropathy and peripheral artery disease on insulin pump for several years, OSA on CPAP, PAD with history of biiliac stents, moderate persistent asthma, obesity, nicotine dependence, HTN, hypothyroidism, osteoporosis and chronic pain and was brought in to the emergency room with AMS. intubated on 06/23/2020; extubated 06/30/2020. Covid Positive. Encephalopathy in the setting of COVID-19 Pneumonia & DKA/HHS.    PT Comments    Pt is making gradual progress towards goals, limited by nausea symptoms. Pt eating lunch upon arrival eating butter with her fork. Agreeable to session, however becomes nauseated with there-ex reporting "I'm going to be sick". RN notified. Updating to SNF as pt not appropriate for CIR.   Follow Up Recommendations  SNF     Equipment Recommendations  None recommended by PT    Recommendations for Other Services       Precautions / Restrictions Precautions Precautions: Fall Restrictions Weight Bearing Restrictions: No    Mobility  Bed Mobility Overal bed mobility: Needs Assistance Bed Mobility: Supine to Sit     Supine to sit: Supervision Sit to supine: Supervision   General bed mobility comments: ease of mobility. Once seated, becomes very nauseated, reporting she feels she is going to be sick  Transfers Overall transfer level: Needs assistance Equipment used: 1 person hand held assist Transfers: Sit to/from Stand Sit to Stand: Mod assist         General transfer comment: returned back to bed due to feeling nauseated  Ambulation/Gait                 Stairs             Wheelchair Mobility    Modified Rankin (Stroke Patients  Only)       Balance Overall balance assessment: Needs assistance Sitting-balance support: Single extremity supported Sitting balance-Leahy Scale: Fair     Standing balance support: Bilateral upper extremity supported Standing balance-Leahy Scale: Poor                              Cognition Arousal/Alertness: Awake/alert Behavior During Therapy: Restless Overall Cognitive Status: Impaired/Different from baseline Area of Impairment: Problem solving;Awareness;Safety/judgement;Following commands;Memory;Attention;Orientation                 Orientation Level: Disoriented to;Place;Time;Situation Current Attention Level: Selective Memory: Decreased recall of precautions;Decreased short-term memory Following Commands: Follows one step commands inconsistently;Follows one step commands with increased time Safety/Judgement: Decreased awareness of safety;Decreased awareness of deficits Awareness: Intellectual Problem Solving: Slow processing;Requires verbal cues;Requires tactile cues General Comments: easily distracted this session      Exercises Other Exercises Other Exercises: Pt educated re: DME recs, d/c recs, falls prevention Other Exercises: LBD, sup<>sit, sit<>stand, sitting/standing balance/tolerance, tooth brushing Other Exercises: supine ther-ex performed on B LE including SLRs. All mobility performed on 2L of O2, however becomes very nauseated with mobility and requests to stop.    General Comments General comments (skin integrity, edema, etc.): HR elevated 120s in standing      Pertinent Vitals/Pain Pain Assessment: No/denies pain    Home Living                      Prior Function  PT Goals (current goals can now be found in the care plan section) Acute Rehab PT Goals Patient Stated Goal: get stronger PT Goal Formulation: With patient Time For Goal Achievement: 07/16/20 Potential to Achieve Goals: Good Progress towards PT  goals: Progressing toward goals    Frequency    Min 2X/week      PT Plan Current plan remains appropriate    Co-evaluation              AM-PAC PT "6 Clicks" Mobility   Outcome Measure  Help needed turning from your back to your side while in a flat bed without using bedrails?: None Help needed moving from lying on your back to sitting on the side of a flat bed without using bedrails?: None Help needed moving to and from a bed to a chair (including a wheelchair)?: A Lot Help needed standing up from a chair using your arms (e.g., wheelchair or bedside chair)?: Total Help needed to walk in hospital room?: Total Help needed climbing 3-5 steps with a railing? : Total 6 Click Score: 13    End of Session Equipment Utilized During Treatment: Oxygen Activity Tolerance: Patient tolerated treatment well Patient left: in bed;with bed alarm set Nurse Communication: Mobility status PT Visit Diagnosis: Muscle weakness (generalized) (M62.81);Other abnormalities of gait and mobility (R26.89)     Time: 0981-1914 PT Time Calculation (min) (ACUTE ONLY): 14 min  Charges:  $Therapeutic Exercise: 8-22 mins                     Greggory Stallion, PT, DPT [phone]    Ashley [name] 07/04/2020, 2:16 PM

## 2020-07-04 NOTE — Progress Notes (Signed)
1        [place] at [place] NAME: Ashley [name]    MR#:  [id number]  DATE OF BIRTH:  November 14, 1965  SUBJECTIVE:  CHIEF COMPLAINT:   Chief Complaint  Patient presents with  . Weakness  . Covid Positive  Feels very weak, coughing, SOB with exertion. Intermittent confusion and agitation per nursing report REVIEW OF SYSTEMS:  Review of Systems  Constitutional: Positive for malaise/fatigue. Negative for diaphoresis, fever and weight loss.  HENT: Negative for ear discharge, ear pain, hearing loss, nosebleeds, sore throat and tinnitus.   Eyes: Negative for blurred vision and pain.  Respiratory: Positive for cough and shortness of breath. Negative for hemoptysis and wheezing.   Cardiovascular: Negative for chest pain, palpitations, orthopnea and leg swelling.  Gastrointestinal: Negative for abdominal pain, blood in stool, constipation, diarrhea, heartburn, nausea and vomiting.  Genitourinary: Negative for dysuria, frequency and urgency.  Musculoskeletal: Negative for back pain and myalgias.  Skin: Negative for itching and rash.  Neurological: Negative for dizziness, tingling, tremors, focal weakness, seizures, weakness and headaches.  Psychiatric/Behavioral: Negative for depression. The patient is not nervous/anxious.    DRUG ALLERGIES:   Allergies  Allergen Reactions  . Mirtazapine Rash    And hives  . Clindamycin Hives  . Other Hives    Tylox   . Sulfonamide Derivatives Hives  . Lipitor [Atorvastatin] Other (See Comments)    MUSCLE ACHES  . Restasis [Cyclosporine]     Pt stated, "My eyes turned red on the inside and outside of eye; burning sensation"  . Topamax [Topiramate] Itching   VITALS:  Blood pressure 124/67, pulse (!) 104, temperature 99.6 F (37.6 C), resp. rate 20, height 4' 11.5" (1.511 m), weight 60.8 kg, SpO2 100 %. PHYSICAL EXAMINATION:  Physical Exam HENT:     Head: Normocephalic and atraumatic.  Eyes:     Conjunctiva/sclera:  Conjunctivae normal.     Pupils: Pupils are equal, round, and reactive to light.  Neck:     Thyroid: No thyromegaly.     Trachea: No tracheal deviation.  Cardiovascular:     Rate and Rhythm: Normal rate and regular rhythm.     Heart sounds: Normal heart sounds.  Pulmonary:     Effort: Pulmonary effort is normal. No respiratory distress.     Breath sounds: Examination of the right-lower field reveals rhonchi. Examination of the left-lower field reveals rhonchi. Decreased breath sounds and rhonchi present. No wheezing.  Chest:     Chest wall: No tenderness.  Abdominal:     General: Bowel sounds are normal. There is no distension.     Palpations: Abdomen is soft.     Tenderness: There is no abdominal tenderness.  Musculoskeletal:        General: Normal range of motion.     Cervical back: Normal range of motion and neck supple.  Skin:    General: Skin is warm and dry.     Findings: No rash.  Neurological:     Mental Status: She is alert and oriented to person, place, and time.     Cranial Nerves: No cranial nerve deficit.   Has PICC line in right forearm placed on 1/13 LABORATORY PANEL:  Female CBC Recent Labs  Lab 07/04/20 0709  WBC 12.4*  HGB 12.6  HCT 38.8  PLT 226   ------------------------------------------------------------------------------------------------------------------ Chemistries  Recent Labs  Lab 07/04/20 0709  NA 139  K 3.8  CL 101  CO2 25  GLUCOSE 103*  BUN  18  CREATININE 0.68  CALCIUM 8.8*  MG 1.8   RADIOLOGY:  No results found. ASSESSMENT AND PLAN:  55 year old female with a known history of DM complicated by gastroparesis, neuropathy, peripheral artery disease on insulin pump for several years, OSA on CPAP, PAD with history of biiliac stents, moderate persistent asthma, OSA on CPAP, obesity, nicotine dependence, HTN, hypothyroidism, osteoporosis and chronic pain admitted for acute metabolic encephalopathy in the setting of multifocal pneumonia  due to COVID and DKA.  She required intubation on 1/13 and was extubated on 1/20.  Transferred to 1C and hospitalist service on 1/23.  1/23 -> awake and alert.  Very weak.  Waiting for disposition to SNF/CIR.  She has completed course of remdesivir and steroids.  She also completed 7-day course of Unasyn.  Off insulin drip.  CT head and MRI brain were negative for any acute pathology. 1/24: very weak requiring 2 people assist, confused and agitated at times, prn haldol and safety sitter    DKA (diabetic ketoacidosis) ([place]) -  Resolved  Severe COVID-19 infection, ARDS and pneumonia/pneumonitis COPD exacerbation Secondary bacterial infection -  Treated and completed course of remdesivir, steroids and IV Unasyn Now resolved  Acute metabolic encephalopathy, suspect COVID encephalopathy -  CT head and MRI brain negative.   - nursing reported intermittent agitation and confusion likely due to delirium - prn haldol and safety sitter    Gastroparesis -  Chronic and stable    Moderate persistent asthma - Continue home inhalers with albuterol as needed    OSA on CPAP - Nighttime CPAP if desired    Essential hypertension - Continue metoprolol    Peripheral arterial disease (HCC) - Continue antiplatelets and statins    Hypothyroidism - Continue levothyroxine, check TSH  Weakness PT, OT initially recommended CIR and is updated to SNF based on her progress. Still 2 people assist but I believe SNF is more realistic option for her considering her COVID  1/23 - I have asked nurse to evaluate her PICC line and see if we can get it removed considering it is in place since 1/13.  She may need peripheral IV before we can remove PICC line. PICC line still not removed on 1/24  Difficult disposition considering her COVID positive status and requiring SNF/CIR  Body mass index is 26.62 kg/m.  Net IO Since Admission: -2,375.58 mL [07/04/20 2030]  Pressure Injury 06/26/20 Heel Right Deep  Tissue Pressure Injury - Purple or maroon localized area of discolored intact skin or blood-filled blister due to damage of underlying soft tissue from pressure and/or shear. (Active)  06/26/20 0805  Location: Heel  Location Orientation: Right  Staging: Deep Tissue Pressure Injury - Purple or maroon localized area of discolored intact skin or blood-filled blister due to damage of underlying soft tissue from pressure and/or shear.  Wound Description (Comments):   Present on Admission:      Status is: Inpatient  Remains inpatient appropriate because:Unsafe d/c plan   Dispo: The patient is from: Home              Anticipated d/c is to: SNF              Anticipated d/c date is: 3 days              Patient currently is medically stable to d/c.   Difficult to place patient Yes PT/OT recommends CIR but considering her COVID positive on 1/11, I do not believe she will be able to get to  CIR for at least 21 days since positive test, or 2 negative test or improvement in her symptoms per CIR admission coordinator.  I have asked TOC team to look for SNF as an alternative disposition option. TOC is working on placement    DVT prophylaxis:       enoxaparin (LOVENOX) injection 40 mg Start: 06/25/20 1000     Family Communication:  "discussed with patient"   All the records are reviewed and case discussed with Care Management/Social Worker. Management plans discussed with the patient, nursing and they are in agreement.  CODE STATUS: Full Code Level of care: Med-Surg  TOTAL TIME TAKING CARE OF THIS PATIENT: 35 minutes.   More than 50% of the time was spent in counseling/coordination of care: YES  POSSIBLE D/C IN 2-3 DAYS, DEPENDING ON CLINICAL CONDITION.   Max Sane M.D on 07/04/2020 at 8:30 PM  [facility]   CC: Primary care physician; Prince Solian, MD  Note: This dictation was prepared with Dragon dictation along with smaller phrase technology. Any transcriptional errors  that result from this process are unintentional.

## 2020-07-04 NOTE — Progress Notes (Signed)
Speech Language Pathology Treatment: Dysphagia  Patient Details Name: Ashley [name] MRN: [id number] DOB: February 09, 1966 Today's Date: 07/04/2020 Time: 0940-1020 SLP Time Calculation (min) (ACUTE ONLY): 40 min  Assessment / Plan / Recommendation Clinical Impression  Pt seen today for ongoing assessment of swallowing; toleration of current dysphagia diet. Pt continues to exhibit Mod+ Confusion d/t Encephalopathy in the setting of COVID-19 Pneumonia & DKA/HHS per MD notes. Pt's speech is tangential; poor orientation and follow through w/ instructions. She often asked about "Ashley [name]" and "making appointments in [place] for several things". Attempted to orient pt to the fact that she was in the Hospital currently; her family members.  Pt's vocal quality appeared min improved; less raspy but her Cough was still reduced in effectiveness and breathy at times; less VC contact noted intermittently. Pt appears to present w/ min-mod oropharyngeal phase dysphagia in light of Significantly declined Cognitive status and suspect possible impact from lengthy oral intubation. Pt exhibits reduced attention to task and need for verbal cues for follow through w/ all po tasks. This presentation impacts her overall awareness/engagement and safety during po tasks which increases risk for aspiration, choking. Risk for aspiration appeared reduced when following general aspiration precautions, using a modified diet consistency, and when given feeding assistance and verbal cues.  Pt consumed trials of ice chips w/out overt s/s of aspiration noted; similar noted for trials of mech soft foods and Nectar liquids. No clinical s/s of aspiration noted: no decline in vocal quality, no cough, and no decline in respiratory status during/post trials(O2 sats 97-99%, RR 21, HR 108.  HOWEVER, delayed and immediate Coughing was noted w/ trials of thin liquids via cup and following aspiration precautions -- 1 sip at a time. Laryngeal  excursion was adequate; timeliness of the transfer/swallow was adequate. Suspect a delay in pharyngeal swallow initiation.  Oral phase was adequate for bolus management and oral clearing of the bolus trials given of the mech soft foods. Timely mastication and A-P transfer was noted w/ no significant oral holding.  Due to pt's declined Cognitive status and her risk for aspiration, recommend continue w/ the Nectar liquids but upgrade to the Dysphagia level 3(mech soft) w/ meats cut w/ gravies to moisten; aspiration precautions; reduce Distractions during meals. Pills Whole vs Crushed in Puree for safer swallowing. Support w/ feeding at meals but allow pt to help engage by [employer] -- check for oral clearing during/post intake. NSG updated. ST will f/u w/ pt's status next 2-3 days. Any objective swallowing assessment(MBSS) will have to wait until pt is off of Covid+ isolation. Will f/u MD/Neurology notes re: declined Cognitive presentation/encephalapathy - pt may need f/u w/ at D/C for Cognitive-linguistic assessment/tx. Precautions posted in room.    HPI HPI: Pt  is a 55 y.o. female with multiple medical history significant for Graves Dis., depression, Diabetes complicated by gastroparesis, esophageal stricture, and neuropathy and peripheral artery disease on insulin pump for several years, OSA on CPAP, PAD with history of biiliac stents, moderate persistent asthma, OSA on CPAP, obesity, nicotine dependence, HTN, hypothyroidism, osteoporosis and chronic pain who tested positive for COVID just over a week ago was brought in to the emergency room with a 3-day complaint of cough and shortness of breath, fever and body aches and generalized weakness and not acting herself.  She denies chest pain, vomiting and diarrhea abdominal pain.  Pt was orally intubated on 06/23/2020; extubated 06/30/2020. Covid Positive. Encephalopathy in the setting of COVID-19 Pneumonia & DKA/HHS  SLP Plan  Continue with current plan  of care (consider objective assessment when able)       Recommendations  Diet recommendations: Dysphagia 3 (mechanical soft);Nectar-thick liquid Liquids provided via: Cup;No straw Medication Administration: Whole meds with puree (vs Crushed in Puree as needed by NSG) Supervision: Patient able to self feed;Staff to assist with self feeding;Intermittent supervision to cue for compensatory strategies Compensations: Minimize environmental distractions;Slow rate;Small sips/bites;Lingual sweep for clearance of pocketing;Follow solids with liquid Postural Changes and/or Swallow Maneuvers: Seated upright 90 degrees;Upright 30-60 min after meal                General recommendations:  (Dietician f/u) Oral Care Recommendations: Oral care BID;Oral care before and after PO;Staff/trained caregiver to provide oral care Follow up Recommendations: Inpatient Rehab;Skilled Nursing facility (TBD) SLP Visit Diagnosis: Dysphagia, oropharyngeal phase (R13.12) Plan: Continue with current plan of care (consider objective assessment when able)       GO                 Orinda Kenner, [place], [place] Pathologist Rehab Services [phone] [facility] 07/04/2020, 5:04 PM

## 2020-07-04 NOTE — TOC Progression Note (Signed)
Transition of Care [facility]) - Progression Note    Patient Details  Name: Ashley [name] MRN: [id number] Date of Birth: [birth year]  [facility]) CM/SW Contact  Shelbie Hutching, RN Phone Number: 07/04/2020, 4:53 PM  Clinical Narrative:     RNCM was able to speak with patient's husband about discharge planning via phone.  Patient's husband would really like for patient to go to CIR but CIR will not review patient until after 21 days of testing + for COVID.  [facility] in [place] has offered a bed- encouraged patient's husband to research [facility], it is the only bed offer so far.  TOC will follow up with husband tomorrow.   Expected Discharge Plan: Skilled Nursing Facility Barriers to Discharge: SNF Pending bed offer,SNF Pending discharge summary,SNF Covid  Expected Discharge Plan and Services Expected Discharge Plan: [place] In-house Referral: Clinical Social Work   Post Acute Care Choice: [place] Living arrangements for the past 2 months: Single Family Home                                       Social Determinants of Health (SDOH) Interventions    Readmission Risk Interventions No flowsheet data found.

## 2020-07-04 NOTE — Progress Notes (Signed)
PHARMACY CONSULT NOTE - FOLLOW UP  Pharmacy Consult for Electrolyte Monitoring and Replacement   Recent Labs: Potassium (mmol/L)  Date Value  07/04/2020 3.8   Magnesium (mg/dL)  Date Value  07/04/2020 1.8   Calcium (mg/dL)  Date Value  07/04/2020 8.8 (L)   Albumin (g/dL)  Date Value  06/26/2020 2.3 (L)  01/09/2019 4.5   Phosphorus (mg/dL)  Date Value  07/04/2020 3.8   Sodium (mmol/L)  Date Value  07/04/2020 139  01/30/2019 142    Assessment: 55 year old female admitted with metabolic encephalopathy in setting of Covid and DKA/HHS. Patient required intubation 1/13 and was subsequently extubated 1/20 and transferred to 1C yesterday. Pharmacy has been asked to replace electrolytes.  Goal of Therapy:  Electrolytes WNL  Plan:   No electrolyte replacement warranted today   Follow up with morning labs  Pernell Dupre, PharmD, BCPS Clinical Pharmacist 07/04/2020 10:17 AM

## 2020-07-05 DIAGNOSIS — F10231 Alcohol dependence with withdrawal delirium: Secondary | ICD-10-CM | POA: Diagnosis not present

## 2020-07-05 DIAGNOSIS — G9341 Metabolic encephalopathy: Secondary | ICD-10-CM | POA: Diagnosis not present

## 2020-07-05 DIAGNOSIS — I10 Essential (primary) hypertension: Secondary | ICD-10-CM | POA: Diagnosis not present

## 2020-07-05 DIAGNOSIS — E0810 Diabetes mellitus due to underlying condition with ketoacidosis without coma: Secondary | ICD-10-CM | POA: Diagnosis not present

## 2020-07-05 LAB — GLUCOSE, CAPILLARY
Glucose-Capillary: 105 mg/dL — ABNORMAL HIGH (ref 70–99)
Glucose-Capillary: 151 mg/dL — ABNORMAL HIGH (ref 70–99)
Glucose-Capillary: 164 mg/dL — ABNORMAL HIGH (ref 70–99)
Glucose-Capillary: 193 mg/dL — ABNORMAL HIGH (ref 70–99)
Glucose-Capillary: 268 mg/dL — ABNORMAL HIGH (ref 70–99)
Glucose-Capillary: 295 mg/dL — ABNORMAL HIGH (ref 70–99)
Glucose-Capillary: 390 mg/dL — ABNORMAL HIGH (ref 70–99)
Glucose-Capillary: 424 mg/dL — ABNORMAL HIGH (ref 70–99)

## 2020-07-05 LAB — CBC WITH DIFFERENTIAL/PLATELET
Abs Immature Granulocytes: 0.12 10*3/uL — ABNORMAL HIGH (ref 0.00–0.07)
Basophils Absolute: 0.1 10*3/uL (ref 0.0–0.1)
Basophils Relative: 0 %
Eosinophils Absolute: 0.1 10*3/uL (ref 0.0–0.5)
Eosinophils Relative: 1 %
HCT: 32 % — ABNORMAL LOW (ref 36.0–46.0)
Hemoglobin: 10.3 g/dL — ABNORMAL LOW (ref 12.0–15.0)
Immature Granulocytes: 1 %
Lymphocytes Relative: 19 %
Lymphs Abs: 2.7 10*3/uL (ref 0.7–4.0)
MCH: 30.6 pg (ref 26.0–34.0)
MCHC: 32.2 g/dL (ref 30.0–36.0)
MCV: 95 fL (ref 80.0–100.0)
Monocytes Absolute: 1.1 10*3/uL — ABNORMAL HIGH (ref 0.1–1.0)
Monocytes Relative: 8 %
Neutro Abs: 9.6 10*3/uL — ABNORMAL HIGH (ref 1.7–7.7)
Neutrophils Relative %: 71 %
Platelets: 335 10*3/uL (ref 150–400)
RBC: 3.37 MIL/uL — ABNORMAL LOW (ref 3.87–5.11)
RDW: 14.4 % (ref 11.5–15.5)
WBC: 13.7 10*3/uL — ABNORMAL HIGH (ref 4.0–10.5)
nRBC: 0.1 % (ref 0.0–0.2)

## 2020-07-05 LAB — BASIC METABOLIC PANEL
Anion gap: 10 (ref 5–15)
BUN: 15 mg/dL (ref 6–20)
CO2: 27 mmol/L (ref 22–32)
Calcium: 8.6 mg/dL — ABNORMAL LOW (ref 8.9–10.3)
Chloride: 103 mmol/L (ref 98–111)
Creatinine, Ser: 0.65 mg/dL (ref 0.44–1.00)
GFR, Estimated: >60 mL/min (ref >60)
Glucose, Bld: 138 mg/dL — ABNORMAL HIGH (ref 70–99)
Potassium: 3.7 mmol/L (ref 3.5–5.1)
Sodium: 140 mmol/L (ref 135–145)

## 2020-07-05 LAB — PHOSPHORUS: Phosphorus: 3.5 mg/dL (ref 2.5–4.6)

## 2020-07-05 LAB — MAGNESIUM: Magnesium: 1.9 mg/dL (ref 1.7–2.4)

## 2020-07-05 MED ORDER — LEVOTHYROXINE SODIUM 50 MCG PO TABS
150.0000 ug | ORAL_TABLET | Freq: Every day | ORAL | Status: DC
  Administered 2020-07-06: 150 ug via ORAL
  Filled 2020-07-05: qty 1

## 2020-07-05 NOTE — Care Management Important Message (Signed)
Important Message  Patient Details  Name: Ashley [name] MRN: [id number] Date of Birth: 03-Sep-1965   Medicare Important Message Given:  Yes     Shelbie Hutching, RN 07/05/2020, 12:31 PM

## 2020-07-05 NOTE — TOC Progression Note (Signed)
Transition of Care [facility]) - Progression Note    Patient Details  Name: Ashley [name] MRN: [id number] Date of Birth: 1966-04-10  Transition of Care [facility]) CM/SW Contact  Shelbie Hutching, RN Phone Number: 07/05/2020, 3:09 PM  Clinical Narrative:    RNCM followed up with patient's husband, Barbaraann Rondo, via phone.  Barbaraann Rondo did not like the reviews for [facility] and prefers to take the patient home with home health services.  Husband can take the patient home tomorrow, that will give him time to get the house in order and make arrangements for someone to be with her 24/7 at home.  Helene Kelp with [facility] accepted home health referral for RN, PT, OT, aide and Speech.  3 in 1 ordered from [employer] and will be delivered to the room today.  Husband will borrow a wheelchair and RW from a friend.     Expected Discharge Plan: [place] Services Barriers to Discharge: Other (comment) (Husband needs time to make arrangements for pt to come home)  Expected Discharge Plan and Services Expected Discharge Plan: [place] In-house Referral: Clinical Social Work Discharge Planning Services: CM Consult Post Acute Care Choice: [place] arrangements for the past 2 months: [place]                 DME Arranged: 3-N-1 DME Agency: [facility] Date DME Agency Contacted: 07/05/20 Time DME Agency Contacted: [phone] Representative spoke with at DME Agency: Mardene Celeste HH Arranged: RN,PT,OT,Nurse's [place] Agency: [facility] [employer] [facility] [employer] Date [place]: 07/05/20 Time [place]: 1508 Representative spoke with at [place]: [place] ([place]) Interventions    Readmission Risk Interventions No flowsheet data found.

## 2020-07-05 NOTE — Progress Notes (Addendum)
1        [place] [facility] [place] NAME: Ashley [name]    MR#:  [id number]  DATE OF BIRTH:  01-09-1966  SUBJECTIVE:  CHIEF COMPLAINT:   Chief Complaint  Patient presents with  . Weakness  . Covid Positive  Feels very weak, coughing, SOB with exertion. Intermittent confusion and agitation per nursing report REVIEW OF SYSTEMS:  Review of Systems  Constitutional: Positive for malaise/fatigue. Negative for diaphoresis, fever and weight loss.  HENT: Negative for ear discharge, ear pain, hearing loss, nosebleeds, sore throat and tinnitus.   Eyes: Negative for blurred vision and pain.  Respiratory: Positive for cough and shortness of breath. Negative for hemoptysis and wheezing.   Cardiovascular: Negative for chest pain, palpitations, orthopnea and leg swelling.  Gastrointestinal: Negative for abdominal pain, blood in stool, constipation, diarrhea, heartburn, nausea and vomiting.  Genitourinary: Negative for dysuria, frequency and urgency.  Musculoskeletal: Negative for back pain and myalgias.  Skin: Negative for itching and rash.  Neurological: Negative for dizziness, tingling, tremors, focal weakness, seizures, weakness and headaches.  Psychiatric/Behavioral: Negative for depression. The patient is not nervous/anxious.    DRUG ALLERGIES:   Allergies  Allergen Reactions  . Mirtazapine Rash    And hives  . Clindamycin Hives  . Other Hives    Tylox   . Sulfonamide Derivatives Hives  . Lipitor [Atorvastatin] Other (See Comments)    MUSCLE ACHES  . Restasis [Cyclosporine]     Pt stated, "My eyes turned red on the inside and outside of eye; burning sensation"  . Topamax [Topiramate] Itching   VITALS:  Blood pressure (!) 153/43, pulse (!) 112, temperature 98.6 F (37 C), temperature source Oral, resp. rate 15, height 4' 11.5" (1.511 m), weight 60.8 kg, SpO2 100 %. PHYSICAL EXAMINATION:  Physical Exam HENT:     Head: Normocephalic and atraumatic.  Eyes:      Conjunctiva/sclera: Conjunctivae normal.     Pupils: Pupils are equal, round, and reactive to light.  Neck:     Thyroid: No thyromegaly.     Trachea: No tracheal deviation.  Cardiovascular:     Rate and Rhythm: Normal rate and regular rhythm.     Heart sounds: Normal heart sounds.  Pulmonary:     Effort: Pulmonary effort is normal. No respiratory distress.     Breath sounds: Examination of the right-lower field reveals rhonchi. Examination of the left-lower field reveals rhonchi. Decreased breath sounds and rhonchi present. No wheezing.  Chest:     Chest wall: No tenderness.  Abdominal:     General: Bowel sounds are normal. There is no distension.     Palpations: Abdomen is soft.     Tenderness: There is no abdominal tenderness.  Musculoskeletal:        General: Normal range of motion.     Cervical back: Normal range of motion and neck supple.  Skin:    General: Skin is warm and dry.     Findings: No rash.  Neurological:     Mental Status: She is alert and oriented to person, place, and time.     Cranial Nerves: No cranial nerve deficit.   Has PICC line in right forearm placed on 1/13 LABORATORY PANEL:  Female CBC Recent Labs  Lab 07/05/20 0631  WBC 13.7*  HGB 10.3*  HCT 32.0*  PLT 335   ------------------------------------------------------------------------------------------------------------------ Chemistries  Recent Labs  Lab 07/05/20 0631  NA 140  K 3.7  CL 103  CO2 27  GLUCOSE 138*  BUN 15  CREATININE 0.65  CALCIUM 8.6*  MG 1.9   RADIOLOGY:  No results found. ASSESSMENT AND PLAN:  55 year old female with a known history of DM complicated by gastroparesis, neuropathy, peripheral artery disease on insulin pump for several years, OSA on CPAP, PAD with history of biiliac stents, moderate persistent asthma, OSA on CPAP, obesity, nicotine dependence, HTN, hypothyroidism, osteoporosis and chronic pain admitted for acute metabolic encephalopathy in the setting of  multifocal pneumonia due to COVID and DKA.  She required intubation on 1/13 and was extubated on 1/20.  Transferred to 1C and hospitalist service on 1/23.  1/23 -> awake and alert.  Very weak.  Waiting for disposition to SNF/CIR.  She has completed course of remdesivir and steroids.  She also completed 7-day course of Unasyn.  Off insulin drip.  CT head and MRI brain were negative for any acute pathology. 1/24: very weak requiring 2 people assist, confused and agitated at times, prn haldol and safety sitter 1/25: Remains on room air.  Appropriate placement remains a big challenge.  Husband does have high expectation to find the right SNF.  He is now requesting to take her home tomorrow as he has set up 24/7 aides.  TOC will assist them the setting home health services.     DKA (diabetic ketoacidosis) ([place]) -  Resolved  Severe COVID-19 infection, ARDS and pneumonia/pneumonitis COPD exacerbation Secondary bacterial infection -  Treated and completed course of remdesivir, steroids and IV Unasyn Now resolved  Acute metabolic encephalopathy, suspect COVID encephalopathy - CT head and MRI brain negative.   - nursing reported intermittent agitation and confusion likely due to delirium - prn haldol and safety sitter    Gastroparesis - Chronic and stable    Moderate persistent asthma - Continue home inhalers with albuterol as needed    OSA on CPAP - Nighttime CPAP if desired    Essential hypertension - Continue metoprolol    Peripheral arterial disease (HCC) - Continue antiplatelets and statins    Hypothyroidism - TSH high, increase synthroid 150 mcg  Weakness PT, OT initially recommended CIR and is updated to SNF based on her progress. Still 2 people assist but I believe SNF is more realistic option for her considering her COVID. TOC notified that husband wants to take her home now and has arranged for private aids 24/7   1/23 - I have asked nurse to evaluate her PICC line  and see if we can get it removed considering it is in place since 1/13.  She may need peripheral IV before we can remove PICC line. PICC line still not removed on 1/24. I talked with nurse again on 1/25 to make sure PICC line is removed before D/C. If she ends up staying longer, we should get peri IV and remove PICC.  Difficult disposition considering her COVID positive status and requiring SNF/CIR  Body mass index is 26.62 kg/m.  Net IO Since Admission: -2,375.58 mL [07/05/20 1551]  Pressure Injury 06/26/20 Heel Right Deep Tissue Pressure Injury - Purple or maroon localized area of discolored intact skin or blood-filled blister due to damage of underlying soft tissue from pressure and/or shear. (Active)  06/26/20 0805  Location: Heel  Location Orientation: Right  Staging: Deep Tissue Pressure Injury - Purple or maroon localized area of discolored intact skin or blood-filled blister due to damage of underlying soft tissue from pressure and/or shear.  Wound Description (Comments):   Present on Admission:  Status is: Inpatient  Remains inpatient appropriate because:Unsafe d/c plan   Dispo: The patient is from: Home              Anticipated d/c is to: SNF              Anticipated d/c date is: 3 days              Patient currently is medically stable to d/c.   Difficult to place patient Yes PT/OT recommends CIR but considering her COVID positive on 1/11, I do not believe she will be able to get to CIR for at least 21 days since positive test, or 2 negative test or improvement in her symptoms per CIR admission coordinator.  I have asked TOC team to look for SNF as an alternative disposition option. TOC is working on placement.    DVT prophylaxis:       enoxaparin (LOVENOX) injection 40 mg Start: 06/25/20 1000     Family Communication:  Updated husband Barbaraann Rondo @ [phone] on 1/25   All the records are reviewed and case discussed with Care Management/Social Worker. Management  plans discussed with the patient, nursing and they are in agreement.  CODE STATUS: Full Code Level of care: Med-Surg  TOTAL TIME TAKING CARE OF THIS PATIENT: 35 minutes.   More than 50% of the time was spent in counseling/coordination of care: YES  POSSIBLE D/C IN 2-3 DAYS, DEPENDING ON CLINICAL CONDITION.   Max Sane M.D on 07/05/2020 at 3:51 PM  [facility]   CC: Primary care physician; Prince Solian, MD  Note: This dictation was prepared with Dragon dictation along with smaller phrase technology. Any transcriptional errors that result from this process are unintentional.

## 2020-07-05 NOTE — Progress Notes (Signed)
Physical Therapy Treatment Patient Details Name: Ashley [name] MRN: [id number] DOB: [birth year] Today's Date: 07/05/2020    History of Present Illness Pt  is a 55 y.o. female with multiple medical history significant for Graves Dis., depression, Diabetes complicated by gastroparesis, esophageal stricture, and neuropathy and peripheral artery disease on insulin pump for several years, OSA on CPAP, PAD with history of biiliac stents, moderate persistent asthma, obesity, nicotine dependence, HTN, hypothyroidism, osteoporosis and chronic pain and was brought in to the emergency room with AMS. intubated on 06/23/2020; extubated 06/30/2020. Covid Positive. Encephalopathy in the setting of COVID-19 Pneumonia & DKA/HHS.    PT Comments    Pt tolerated treatment well today, but further participation limited due to poor cardiovascular tolerance to activity, confusion, and poor safety awareness. Pt HR ranged from 112-148 bpm during mobility (HRmax 116bpm), and RR reaching 41 breaths/min with seated therex. Due to poor safety awareness and confusion, pt required increased multimodal cueing for safety with all mobility. Pt able to participate in seated/supine BLE therex, but declined standing and ambulation, without reason. Pt will continue to benefit from skilled acute PT services to address deficits for return to baseline function. Will continue to recommend SNF at DC.    Follow Up Recommendations  SNF     Equipment Recommendations  None recommended by PT    Recommendations for Other Services Rehab consult     Precautions / Restrictions Precautions Precautions: Fall Precaution Comments: Air/Con Restrictions Weight Bearing Restrictions: No    Mobility  Bed Mobility Overal bed mobility: Needs Assistance Bed Mobility: Sit to Supine       Sit to supine: Supervision   General bed mobility comments: Pt seated EOB with NT upon entry; supervision for sit>supine transfer, increased  time/effort  Transfers Overall transfer level: Needs assistance Equipment used: 1 person hand held assist;Rolling walker (2 wheeled) Transfers: Sit to/from Stand Sit to Stand: Mod assist;From elevated surface         General transfer comment: Mod assist for STS from EOB x2 and BSC x1. Improved stability noted with RW vs. HHA. Initial posterior lean requiring cues for correction.  Ambulation/Gait Ambulation/Gait assistance: Mod assist Gait Distance (Feet): 2 Feet (3 steps x2) Assistive device: 1 person hand held assist       General Gait Details: Mod assist to ambulate 3 steps x2 with HHA from EOB<>BSC. Pt demonstrates poor balance, impaired motor planning/coordination, decreased step length/foot clearance, and poor safety awareness. Max multimodal cues for safety.     Balance Overall balance assessment: Needs assistance Sitting-balance support: Single extremity supported;Feet supported Sitting balance-Leahy Scale: Fair Sitting balance - Comments: Fair seated balance with unilateral UE support, performing therex at EOB   Standing balance support: Bilateral upper extremity supported;During functional activity Standing balance-Leahy Scale: Poor Standing balance comment: Poor standing balance with BUE supported in RW and BUE support on bedrail/HHA, requiring mod assist for maintenance of upright positioning                            Cognition Arousal/Alertness: Awake/alert Behavior During Therapy: Restless;Impulsive Overall Cognitive Status: Impaired/Different from baseline Area of Impairment: Problem solving;Awareness;Safety/judgement;Following commands;Memory;Attention;Orientation                 Orientation Level: Disoriented to;Place;Time;Situation Current Attention Level: Sustained Memory: Decreased recall of precautions;Decreased short-term memory Following Commands: Follows one step commands inconsistently;Follows one step commands with increased  time Safety/Judgement: Decreased awareness of safety;Decreased awareness of deficits Awareness: Intellectual  Problem Solving: Slow processing;Requires verbal cues;Requires tactile cues General Comments: Alert and able to intermittent follow simple one-step commands with mobility, impulsivity requiring increased multimodal cues for safety      Exercises Other Exercises Other Exercises: Pt able to participate in bed mobility, transfers, and short distance ambulation today, requiring grossly mod assist for transfers/ambulation due to poor standing balance. Pt with improved safety/balance with use of RW vs. HHA. Pt able to demonstrate lateral weight shift in RW, but declined participation with standing marching in RW. Increased assistance/cues required due to poor safety awareness and impulsivity. Other Exercises: Pt able to transfer from EOB<>BSC for toileting. Pt able to brush teeth, change gown, and apply deoderant while seated on BSC. Other Exercises: Pt able to participate in x10 BLE exercises including: marching, LAQ, ankle pumps, isometric hip ADD, SLR, and hip ABD/ADD.    General Comments General comments (skin integrity, edema, etc.): HR ranged from 112-148bpm during mobility, with RR reaching 41 breaths/min with seated therex      Pertinent Vitals/Pain Pain Assessment: No/denies pain Faces Pain Scale: No hurt           PT Goals (current goals can now be found in the care plan section) Acute Rehab PT Goals Patient Stated Goal: get stronger PT Goal Formulation: With patient Time For Goal Achievement: 07/16/20 Potential to Achieve Goals: Good Progress towards PT goals: Progressing toward goals    Frequency    Min 2X/week      PT Plan Current plan remains appropriate       AM-PAC PT "6 Clicks" Mobility   Outcome Measure  Help needed turning from your back to your side while in a flat bed without using bedrails?: None Help needed moving from lying on your back to  sitting on the side of a flat bed without using bedrails?: None Help needed moving to and from a bed to a chair (including a wheelchair)?: A Lot Help needed standing up from a chair using your arms (e.g., wheelchair or bedside chair)?: A Lot Help needed to walk in hospital room?: Total Help needed climbing 3-5 steps with a railing? : Total 6 Click Score: 14    End of Session Equipment Utilized During Treatment: Gait belt Activity Tolerance: Patient limited by fatigue Patient left: in bed;with bed alarm set;with call bell/phone within reach Nurse Communication: Mobility status PT Visit Diagnosis: Muscle weakness (generalized) (M62.81);Other abnormalities of gait and mobility (R26.89)     Time: 6195-0932 PT Time Calculation (min) (ACUTE ONLY): 28 min  Charges:  $Therapeutic Exercise: 8-22 mins $Therapeutic Activity: 8-22 mins                     Herminio Commons, PT, DPT 3:54 PM,07/05/20

## 2020-07-05 NOTE — Progress Notes (Addendum)
PHARMACY CONSULT NOTE - FOLLOW UP  Pharmacy Consult for Electrolyte Monitoring and Replacement   Recent Labs: Potassium (mmol/L)  Date Value  07/05/2020 3.7   Magnesium (mg/dL)  Date Value  07/05/2020 1.9   Calcium (mg/dL)  Date Value  07/05/2020 8.6 (L)   Albumin (g/dL)  Date Value  06/26/2020 2.3 (L)  01/09/2019 4.5   Phosphorus (mg/dL)  Date Value  07/05/2020 3.5   Sodium (mmol/L)  Date Value  07/05/2020 140  01/30/2019 142    Assessment: 55 year old female admitted with metabolic encephalopathy in setting of Covid and DKA/HHS. Patient required intubation 1/13 and was subsequently extubated 1/20 and transferred to 1C 1/23. Pharmacy has been asked to replace electrolytes.  Goal of Therapy:  Electrolytes WNL  Plan:   No electrolyte replacement warranted today   Will CTM peripherally  Pharmacy signing off, reconsult if needed.  Lorna Dibble, PharmD, BCPS Clinical Pharmacist 07/05/2020 9:00 AM

## 2020-07-05 NOTE — Progress Notes (Signed)
Occupational Therapy Treatment Patient Details Name: Ashley [name] MRN: [id number] DOB: [birth year] Today's Date: 07/05/2020    History of present illness Pt  is a 55 y.o. female with multiple medical history significant for Graves Dis., depression, Diabetes complicated by gastroparesis, esophageal stricture, and neuropathy and peripheral artery disease on insulin pump for several years, OSA on CPAP, PAD with history of biiliac stents, moderate persistent asthma, obesity, nicotine dependence, HTN, hypothyroidism, osteoporosis and chronic pain and was brought in to the emergency room with AMS. intubated on 06/23/2020; extubated 06/30/2020. Covid Positive. Encephalopathy in the setting of COVID-19 Pneumonia & DKA/HHS.   OT comments  Upon entering the room, pt in long sitting in flat bed with breakfast tray in front of her. Pt was able to verbalize food on tray table. Pt with no c/o pain and oriented to self only this session. Pt reports , " I didn't know we were in [place] until that doctor from [place] came in to see me." OT reorienting pt to time and current situation. Pt unaware of massive incontinence episode in bed. OT needing to show her wet pad and wet hospital gown for her to be agreeable to hygiene. Supine >sit with supervision to EOB. Pt standing with mod lifting assistance to remove soiled chux pad. Pt washing peri area and buttocks with lateral leans with min A on EOB and clean gown donned. Pt with tangential speech throughout and needing cuing for redirection. Pt believed family outside of room and requesting to ambulate to RN station. Pt standing x 3 reps with mod A each time and unable to take steps before she abruptly returns to sitting. OT assisted pt with calling husband before leaving the room. All needs within reach. Pt would continue to benefit from OT intervention.   Follow Up Recommendations  CIR ( family wishes to continue to pursue although pt unable to go until 21 days post  covid)   Equipment Recommendations  Other (comment) (defer to next venue of care)       Precautions / Restrictions Precautions Precautions: Fall Restrictions Weight Bearing Restrictions: No       Mobility Bed Mobility Overal bed mobility: Needs Assistance Bed Mobility: Sit to Supine       Sit to supine: Supervision   General bed mobility comments: Pt in long sitting in bed with breakfast tray in front of her upon entering the room.  Transfers Overall transfer level: Needs assistance Equipment used: 1 person hand held assist Transfers: Sit to/from Stand Sit to Stand: Mod assist         General transfer comment: Mod A sit <>stand x 3 reps and unable to take steps. Pt reports, "too weak".    Balance Overall balance assessment: Needs assistance Sitting-balance support: Single extremity supported Sitting balance-Leahy Scale: Fair     Standing balance support: Bilateral upper extremity supported Standing balance-Leahy Scale: Poor             ADL either performed or assessed with clinical judgement   ADL Overall ADL's : Needs assistance/impaired     Grooming: Wash/dry hands;Wash/dry face;Sitting;Supervision/safety;Set up       Lower Body Bathing: Moderate assistance;Sit to/from stand       Lower Body Dressing: Min guard;Bed level Lower Body Dressing Details (indicate cue type and reason): B socks                     Vision Patient Visual Report: No change from baseline  Cognition Arousal/Alertness: Awake/alert Behavior During Therapy: Restless;Impulsive Overall Cognitive Status: Impaired/Different from baseline Area of Impairment: Problem solving;Awareness;Safety/judgement;Following commands;Memory;Attention;Orientation                 Orientation Level: Disoriented to;Place;Time;Situation Current Attention Level: Sustained Memory: Decreased recall of precautions;Decreased short-term memory Following Commands: Follows  one step commands inconsistently;Follows one step commands with increased time Safety/Judgement: Decreased awareness of safety;Decreased awareness of deficits Awareness: Intellectual Problem Solving: Slow processing;Requires verbal cues;Requires tactile cues General Comments: very confused and needing mod  multimodal cuing for sequencing, initiation, and safety awareness.                   Pertinent Vitals/ Pain       Pain Assessment: Faces Faces Pain Scale: No hurt         Frequency  Min 2X/week        Progress Toward Goals  OT Goals(current goals can now be found in the care plan section)  Progress towards OT goals: Progressing toward goals  Acute Rehab OT Goals Patient Stated Goal: get stronger OT Goal Formulation: With patient Time For Goal Achievement: 07/16/20 Potential to Achieve Goals: Good  Plan Frequency remains appropriate;Discharge plan remains appropriate       AM-PAC OT "6 Clicks" Daily Activity     Outcome Measure   Help from another person eating meals?: A Little Help from another person taking care of personal grooming?: A Little Help from another person toileting, which includes using toliet, bedpan, or urinal?: A Lot Help from another person bathing (including washing, rinsing, drying)?: A Lot Help from another person to put on and taking off regular upper body clothing?: A Little Help from another person to put on and taking off regular lower body clothing?: A Lot 6 Click Score: 15    End of Session    OT Visit Diagnosis: Other abnormalities of gait and mobility (R26.89)   Activity Tolerance Patient tolerated treatment well   Patient Left in bed;with call bell/phone within reach;with bed alarm set   Nurse Communication Mobility status;Precautions        Time: 0930-1009 OT Time Calculation (min): 39 min  Charges: OT General Charges $OT Visit: 1 Visit OT Treatments $Self Care/Home Management : 38-52 mins  Darleen Crocker, MS,  OTR/L , CBIS ascom [phone]  07/05/20, 1:04 PM

## 2020-07-06 DIAGNOSIS — E039 Hypothyroidism, unspecified: Secondary | ICD-10-CM | POA: Diagnosis not present

## 2020-07-06 DIAGNOSIS — I10 Essential (primary) hypertension: Secondary | ICD-10-CM

## 2020-07-06 DIAGNOSIS — E0810 Diabetes mellitus due to underlying condition with ketoacidosis without coma: Secondary | ICD-10-CM | POA: Diagnosis not present

## 2020-07-06 DIAGNOSIS — U071 COVID-19: Secondary | ICD-10-CM | POA: Diagnosis not present

## 2020-07-06 LAB — CBC WITH DIFFERENTIAL/PLATELET
Abs Immature Granulocytes: 0.15 10*3/uL — ABNORMAL HIGH (ref 0.00–0.07)
Basophils Absolute: 0.1 10*3/uL (ref 0.0–0.1)
Basophils Relative: 0 %
Eosinophils Absolute: 0.1 10*3/uL (ref 0.0–0.5)
Eosinophils Relative: 1 %
HCT: 29.5 % — ABNORMAL LOW (ref 36.0–46.0)
Hemoglobin: 9.6 g/dL — ABNORMAL LOW (ref 12.0–15.0)
Immature Granulocytes: 1 %
Lymphocytes Relative: 20 %
Lymphs Abs: 3 10*3/uL (ref 0.7–4.0)
MCH: 30.9 pg (ref 26.0–34.0)
MCHC: 32.5 g/dL (ref 30.0–36.0)
MCV: 94.9 fL (ref 80.0–100.0)
Monocytes Absolute: 0.9 10*3/uL (ref 0.1–1.0)
Monocytes Relative: 6 %
Neutro Abs: 11.1 10*3/uL — ABNORMAL HIGH (ref 1.7–7.7)
Neutrophils Relative %: 72 %
Platelets: 302 10*3/uL (ref 150–400)
RBC: 3.11 MIL/uL — ABNORMAL LOW (ref 3.87–5.11)
RDW: 14.4 % (ref 11.5–15.5)
WBC: 15.4 10*3/uL — ABNORMAL HIGH (ref 4.0–10.5)
nRBC: 0 % (ref 0.0–0.2)

## 2020-07-06 LAB — GLUCOSE, CAPILLARY
Glucose-Capillary: 171 mg/dL — ABNORMAL HIGH (ref 70–99)
Glucose-Capillary: 249 mg/dL — ABNORMAL HIGH (ref 70–99)
Glucose-Capillary: 447 mg/dL — ABNORMAL HIGH (ref 70–99)

## 2020-07-06 LAB — PHOSPHORUS: Phosphorus: 3.3 mg/dL (ref 2.5–4.6)

## 2020-07-06 LAB — BASIC METABOLIC PANEL
Anion gap: 11 (ref 5–15)
BUN: 16 mg/dL (ref 6–20)
CO2: 25 mmol/L (ref 22–32)
Calcium: 8.5 mg/dL — ABNORMAL LOW (ref 8.9–10.3)
Chloride: 101 mmol/L (ref 98–111)
Creatinine, Ser: 0.66 mg/dL (ref 0.44–1.00)
GFR, Estimated: >60 mL/min (ref >60)
Glucose, Bld: 187 mg/dL — ABNORMAL HIGH (ref 70–99)
Potassium: 3.6 mmol/L (ref 3.5–5.1)
Sodium: 137 mmol/L (ref 135–145)

## 2020-07-06 LAB — MAGNESIUM: Magnesium: 1.7 mg/dL (ref 1.7–2.4)

## 2020-07-06 MED ORDER — DOCUSATE SODIUM 50 MG/5ML PO LIQD: 100.0000 mg | Freq: Two times a day (BID) | ORAL | 0 refills | Status: DC

## 2020-07-06 MED ORDER — NEPRO/CARBSTEADY PO LIQD: 237.0000 mL | Freq: Three times a day (TID) | ORAL | 0 refills | Status: DC

## 2020-07-06 MED ORDER — INSULIN ASPART 100 UNIT/ML ~~LOC~~ SOLN
9.0000 [IU] | Freq: Three times a day (TID) | SUBCUTANEOUS | Status: DC
  Administered 2020-07-06: 13:00:00 9 [IU] via SUBCUTANEOUS
  Filled 2020-07-06: qty 1

## 2020-07-06 MED ORDER — FAMOTIDINE 20 MG PO TABS: 40.0000 mg | ORAL_TABLET | Freq: Two times a day (BID) | ORAL | 1 refills | Status: DC

## 2020-07-06 MED ORDER — INSULIN DETEMIR 100 UNIT/ML ~~LOC~~ SOLN
14.0000 [IU] | Freq: Two times a day (BID) | SUBCUTANEOUS | Status: DC
  Filled 2020-07-06 (×2): qty 0.14

## 2020-07-06 MED ORDER — METOPROLOL SUCCINATE ER 50 MG PO TB24: 50.0000 mg | ORAL_TABLET | Freq: Every day | ORAL | 1 refills | Status: DC

## 2020-07-06 MED ORDER — QUETIAPINE FUMARATE 50 MG PO TABS: 50.0000 mg | ORAL_TABLET | Freq: Every day | ORAL | 0 refills | Status: DC

## 2020-07-06 NOTE — TOC Transition Note (Addendum)
Transition of Care [facility]) - CM/SW Discharge Note   Patient Details  Name: Ashley [name] MRN: [id number] Date of Birth: 1966-01-11  Transition of Care [facility]) CM/SW Contact:  Magnus Ivan, LCSW Phone Number: 07/06/2020, 11:25 AM   Clinical Narrative:   Patient to discharge home today. CSW informed [place]. 3 in 1 already ordered by [facility]. Spoke to patient's husband who confirmed he has arranged 24 hour supervision at home. He is aware patient can't walk and has a w/c to use at home. He requested RN make sure patient's insulin pump is sent home with her. He will pick patient up when patient is ready for DC. Updated DO and RN.    Final next level of care: [place] Barriers to Discharge: Barriers Resolved   Patient Goals and CMS Choice Patient states their goals for this hospitalization and ongoing recovery are:: home with home health CMS Medicare.gov Compare Post Acute Care list provided to:: Patient Represenative (must comment) Choice offered to / list presented to : Spouse  Discharge Placement                Patient to be transferred to facility by: spouse Name of family member notified: spouse aware    Discharge Plan and Services In-house Referral: Clinical Social Work Discharge Planning Services: CM Consult Post Acute Care Choice: Home Health          DME Arranged: 3-N-1 DME Agency: [employer] Date DME Agency Contacted: 07/05/20 Time DME Agency Contacted: [phone] Representative spoke with at DME Agency: Mardene Celeste HH Arranged: RN,PT,OT,Nurse's [place] Agency: [facility] [facility] [employer] Date [place]: 07/06/20 Time [place]: 1508 Representative spoke with at [place]: [place] ([place]) Interventions     Readmission Risk Interventions No flowsheet data found.

## 2020-07-06 NOTE — Progress Notes (Signed)
Speech Language Pathology Treatment: Dysphagia  Patient Details Name: Ashley [name] MRN: [id number] DOB: [birth year] Today's Date: 07/06/2020 Time: 5366-4403 SLP Time Calculation (min) (ACUTE ONLY): 60 min  Assessment / Plan / Recommendation Clinical Impression  Pt seen today for ongoing assessment of swallowing; toleration of current dysphagia diet and trials to upgrade if appropriate. Pt continues to exhibit Mod+ Confusion and Tangential speech d/t Encephalopathy in the setting of COVID-19 Pneumonia &DKA/HHS per MD notes. Pt's speech is tangential; poor orientation and follow through w/ instructions. She stated she had been in "[place]" prior to admit seen by "Ashley [name]". Attempted to orient pt to the fact that she was in the Hospital currently; her family members. She stated her family were firefighters.  Pt's vocal quality appeared min improved; less raspy and her Cough was improved w/ min hoarseness but more effective; VC contact improved intermittently. Suspect this is related to lengthy intubation. Pt appears to present w/ min-mod oropharyngeal phase dysphagia in light of Moderately declined Cognitive status and suspect possible impact from lengthy oral intubation. Pt exhibits reduced attention to task and need for verbal cues for follow through w/ po tasks w/ less Talking and more attention to task. This presentation impacts her overall awareness/engagement and safety during po tasks which increases risk for aspiration, choking. Risk for aspiration appeared reduced when following general aspiration precautions, using a modified diet consistency, and when given verbal cues during tasks.  Pt consumed trials of ice chips w/out overt s/s of aspiration noted; similar noted for trials of mech soft foods and Nectar liquids via Cup. No clinical s/s of aspiration noted: no decline in vocal quality, no cough, and no decline in respiratory status during/post trials(O2 sats 97-98%, RR 20. HOWEVER,  delayed and immediate Throat clearing was noted w/ trials of thin liquids via Cup even when following aspiration precautions -- 1 sip at a time, slowly, no Talking. Laryngeal excursion was adequate; timeliness of the transfer/swallow was adequate. Suspect a delay in pharyngeal swallow initiation -- again, could be related to lengthy oral intubation.  Oral phase was adequate for bolus management and oral clearing of the bolus trials given of the mech soft foods. Timely mastication and A-P transfer was noted w/ no significant oral holding.  Due to pt's declined Cognitive status and overt s/s of aspiration exhibited w/ Thin liquids at today's session, recommend continue w/ the Nectar liquids and continue w/ the Mech Soft foods w/ meats cut and gravies to moisten; aspiration precautions; reduce Distractions during meals. Pills Whole vs Crushed in Puree for safer swallowing. Support w/ verbal cues at meals; Setup support but let pt help to feed self. Recommend giving ice chips in BETWEEN meals post oral care w/ Supervision and stop if increased coughing noted -- 1 chip at a time. NSG updated. ST will f/u w/ pt's status next 2-3 days. Any objective swallowing assessment(MBSS) will have to wait until pt is off of Covid+ isolation. Will f/u MD/Neurology notes re: declined Cognitive presentation/encephalapathy - pt may need f/u w/ at D/C for Cognitive-linguistic assessment/tx. Precautions posted in room. Addendum: orders placed for to D/C home w/ family/husband. Handouts prepared on dysphagia diet and food/drink consistency; Thickener and PreThickened drinks and prep handouts given. Aspiration precautions and Rije Drink cup information and handouts for explanation. NSG updated and will contact ST if any questions.  Pt is to receive [place] services upon Home D/C per SW notes.      HPI HPI: Pt  is a 55 y.o.  female with multiple medical history significant for Graves Dis., depression, Diabetes complicated by  gastroparesis, esophageal stricture, and neuropathy and peripheral artery disease on insulin pump for several years, OSA on CPAP, PAD with history of biiliac stents, moderate persistent asthma, OSA on CPAP, obesity, nicotine dependence, HTN, hypothyroidism, osteoporosis and chronic pain who tested positive for COVID just over a week ago was brought in to the emergency room with a 3-day complaint of cough and shortness of breath, fever and body aches and generalized weakness and not acting herself.  She denies chest pain, vomiting and diarrhea abdominal pain.  Pt was orally intubated on 06/23/2020; extubated 06/30/2020. Covid Positive. Encephalopathy in the setting of COVID-19 Pneumonia & DKA/HHS      SLP Plan  Continue with current plan of care (consider an objective assessment as needed when off Covid+)       Recommendations  Diet recommendations: Dysphagia 3 (mechanical soft);Nectar-thick liquid Liquids provided via: Cup;No straw Medication Administration: Whole meds with puree (for safer swallow) Supervision: Patient able to self feed;Intermittent supervision to cue for compensatory strategies (setup) Compensations: Minimize environmental distractions;Slow rate;Small sips/bites;Lingual sweep for clearance of pocketing;Follow solids with liquid Postural Changes and/or Swallow Maneuvers: Seated upright 90 degrees;Upright 30-60 min after meal;Out of bed for meals                General recommendations:  (Dietician f/u; Outpt or [place] ST services) Oral Care Recommendations: Oral care BID;Oral care before and after PO;Patient independent with oral care (supervised) Follow up Recommendations: Home health SLP (determined by Husband and SW) SLP Visit Diagnosis: Dysphagia, oropharyngeal phase (R13.12);Cognitive communication deficit (R41.841) Plan: Continue with current plan of care (consider an objective assessment as needed when off Covid+)       [place], [place],  [place] Pathologist Rehab Services [phone] [facility] 07/06/2020, 3:00 PM

## 2020-07-06 NOTE — Progress Notes (Signed)
Inpatient Diabetes Program Recommendations  AACE/ADA: New Consensus Statement on Inpatient Glycemic Control   Target Ranges:  Prepandial:   less than 140 mg/dL      Peak postprandial:   less than 180 mg/dL (1-2 hours)      Critically ill patients:  140 - 180 mg/dL   Results for Ashley [name], Ashley [name] (MRN [id number]) as of 07/06/2020 10:58  Ref. Range 07/05/2020 08:51 07/05/2020 13:05 07/05/2020 13:11 07/05/2020 16:46 07/05/2020 20:43 07/05/2020 23:44 07/06/2020 04:06 07/06/2020 08:35  Glucose-Capillary Latest Ref Range: 70 - 99 mg/dL 105 (H) 424 (H) 390 (H) 295 (H) 268 (H) 164 (H) 171 (H) 249 (H)   Review of Glycemic Control  Diabetes history:DM1 (makes NO insulin; requires basal, correction, and carb coverage insulin) Outpatient Diabetes medications:Insulin Pump with Novolog Current orders for Inpatient glycemic control:Levemir 12 units BID, Novolog 0-15 units Q4H, Novolog 7 units TID with meals  Inpatient Diabetes Program Recommendations:    Insulin: Please consider increasing Levemir to 14 units BID and meal coverage to Novolog 9 units TID with meals.  Thanks, Barnie Alderman, RN, MSN, CDE Diabetes Coordinator Inpatient Diabetes Program [phone] (Team Pager from 8am to 5pm)

## 2020-07-06 NOTE — Discharge Summary (Signed)
Physician Discharge Summary  EZELLE MCELHONE [id number] DOB: 05-Mar-1966 DOA: 06/20/2020  PCP: Prince Solian, MD  Admit date: 06/20/2020 Discharge date: 07/15/2020  Admitted From: home Disposition:  home  Recommendations for Outpatient Follow-up:  1. Follow up with PCP in 1-2 weeks 2. Please obtain BMP/CBC in one week 3. Please follow up on patient's blood pressure.  She was normotensive during admission, occasionally soft BP's with her medications held.  Prior antihypertensives were d/c'd and may need to be resumed if BP increases after discharge.    Home Health: PT, OT, RN, Aide, SLP, Respiratory, Social Work  Equipment/Devices: 3-n-1    Discharge Condition: Stable  CODE STATUS: Full  Diet recommendation: Carb controlled, dysphagia 3 with nectar thickened liquids  - further advancement of diet per home health SLP  Discharge Diagnoses: Principal Problem:   DKA (diabetic ketoacidosis) ([place]) Active Problems:   Gastroparesis   Moderate persistent asthma   OSA on CPAP   Essential hypertension   Peripheral arterial disease ([facility])   Hypothyroidism   Rheumatoid arthritis ([place])   Pneumonia due to COVID-19 virus   Endotracheal tube present    Summary of HPI and Hospital Course:  55 year old female with a known history of DM complicated by gastroparesis, neuropathy, peripheral artery disease on insulin pump for several years, OSA on CPAP, PAD with history of biiliac stents, moderate persistent asthma, OSA on CPAP, obesity, nicotine dependence, HTN, hypothyroidism, osteoporosis and chronic pain admitted for acute metabolic encephalopathy in the setting of multifocal pneumonia due to COVID and DKA.  She required intubation on 1/13 and was extubated on 1/20.    Transferred to 1C and hospitalist service on 1/23.  Off insulin drip.  Completed treated for Covid-19 with remdesivir and steroids, in addition to 7 day course of Unasyn.   CT head and MRI brain were negative for any acute  pathology.  Patient began working with PT, very weak requiring 2 people assist, continued to be confused and agitated at times, requiring prn haldol and Air cabin crew.  She was weaned off oxygen and respiratory status remained stable.    Hospitalization prolonged with disposition challenges and inability to find right SNF/rehab setting for the patient.  Ultimately, patient's husband was able to arrange 24/7 aides and requested to take patient home with home health services.      DKA (diabetic ketoacidosis) - POA.  - Resolved, treated with insulin gtt per Endotool in ICU  Severe COVID-19 infection, ARDS and pneumonia/pneumonitis COPD exacerbation Secondary bacterial infection - Treated and completed course of remdesivir, steroids and IV Unasyn.  Resolved.  Acute metabolic encephalopathy, suspect COVID encephalopathy - CT head and MRI brain negative.   Nursing reported intermittent agitation and confusion likely due to delirium.  Required prn haldol and safety sitter.  Improved.  Gastroparesis - Chronic and stable  Moderate persistent asthma - Continue home inhalers with albuterol as needed  OSA on CPAP Nighttime CPAP recommended  Essential hypertension -Continue metoprolol  Peripheral arterial disease - Continue antiplatelets and statins  Hypothyroidism TSH high, increase synthroid 150 mcg.  Primary care follow up.  Generalized Weakness PT, OT initially recommended CIR, later updated to SNF based on patient's progress during admission.  However, continued to require 2 people assist Husband decided to take her home now and has arranged for private aids 24/7.  Difficult disposition considering her COVID positive status and requiring SNF/CIR.  Discharging home [facility] services as above and 24-7 caregivers.   Right heal pressure injury - clinically undetermined if  POA Pressure Injury 06/26/20 Heel Right Deep Tissue Pressure Injury - Purple or maroon  localized area of discolored intact skin or blood-filled blister due to damage of underlying soft tissue from pressure and/or shear. (Active)  06/26/20 0805  Location: Heel  Location Orientation: Right  Staging: Deep Tissue Pressure Injury - Purple or maroon localized area of discolored intact skin or blood-filled blister due to damage of underlying soft tissue from pressure and/or shear.  Wound Description (Comments):   Present on Admission:      Discharge Instructions   Discharge Instructions    Call MD for:  extreme fatigue   Complete by: As directed    Call MD for:  persistant dizziness or light-headedness   Complete by: As directed    Call MD for:  persistant nausea and vomiting   Complete by: As directed    Call MD for:  severe uncontrolled pain   Complete by: As directed    Call MD for:  temperature >100.4   Complete by: As directed    Discharge instructions   Complete by: As directed    Please monitor blood pressure 1-2 times daily at home, if possible. Write down blood pressures to bring to follow up doctor appointments. Your blood pressure has been normal with some of your medications held, so these were stopped for now. Your primary care doctor can tell you if and/or when to restart taking other BP meds (amlodipine, HCTZ and losartan).  Work with therapy at home as much as possible, and do the exercises they show you at least 2-3 times a day in between their visits.  This is most important thing you can do to regain your strength and mobility as your recover.   Discharge wound care:   Complete by: As directed    Monitor the right heel closely and offload pressure from right heel with a pillow under the lower leg as needed.   Increase activity slowly   Complete by: As directed      Allergies as of 07/06/2020      Reactions   Mirtazapine Rash   And hives   Clindamycin Hives   Other Hives   Tylox    Sulfonamide Derivatives Hives   Lipitor [atorvastatin] Other  (See Comments)   MUSCLE ACHES   Restasis [cyclosporine]    Pt stated, "My eyes turned red on the inside and outside of eye; burning sensation"   Topamax [topiramate] Itching      Medication List    STOP taking these medications   amLODipine 5 MG tablet Commonly known as: NORVASC   hydrochlorothiazide 12.5 MG capsule Commonly known as: MICROZIDE   losartan 100 MG tablet Commonly known as: COZAAR     TAKE these medications   Accu-Chek Compact Plus test strip Generic drug: glucose blood 1 each by Other route as needed.   Accu-Chek Softclix Lancets lancets   albuterol (2.5 MG/3ML) 0.083% nebulizer solution Commonly known as: PROVENTIL Take 2.5 mg by nebulization every 4 (four) hours as needed for wheezing or shortness of breath.   ALPRAZolam 0.5 MG tablet Commonly known as: XANAX Take 0.5 mg by mouth 3 (three) times daily as needed for anxiety.   AMBULATORY NON FORMULARY MEDICATION NOVOLOG INSULIN PUMP USES AS DIRECTED   busPIRone 15 MG tablet Commonly known as: BUSPAR Take 15 mg by mouth 2 (two) times daily.   clopidogrel 75 MG tablet Commonly known as: PLAVIX TAKE 1 TABLET (75 MG TOTAL) BY MOUTH DAILY WITH BREAKFAST.  docusate 50 MG/5ML liquid Commonly known as: COLACE Take 10 mLs (100 mg total) by mouth 2 (two) times daily. Hold if having loose or frequent stools.   DULoxetine 60 MG capsule Commonly known as: CYMBALTA Take 60 mg by mouth every morning. Takes 90 mg total   DULoxetine 30 MG capsule Commonly known as: CYMBALTA Take 30 mg by mouth every morning. Takes 90 mg total   estradiol 0.5 MG tablet Commonly known as: ESTRACE Take 0.5 mg by mouth daily.   famotidine 20 MG tablet Commonly known as: PEPCID Take 2 tablets (40 mg total) by mouth 2 (two) times daily. What changed:   medication strength  when to take this   feeding supplement (NEPRO CARB STEADY) Liqd Take 237 mLs by mouth 3 (three) times daily between meals.   folic acid 1 MG  tablet Commonly known as: FOLVITE Take 1 tablet by mouth daily.   ibandronate 150 MG tablet Commonly known as: BONIVA Take 150 mg by mouth every 30 (thirty) days.   insulin pump Soln Inject into the skin.   lansoprazole 30 MG capsule Commonly known as: PREVACID Take 1 capsule (30 mg total) by mouth in the morning and at bedtime.   latanoprost 0.005 % ophthalmic solution Commonly known as: XALATAN Place 1 drop into both eyes at bedtime.   levocetirizine 5 MG tablet Commonly known as: XYZAL Take 5 mg by mouth every morning.   levothyroxine 112 MCG tablet Commonly known as: SYNTHROID Take 112 mcg by mouth daily before breakfast.   methotrexate 2.5 MG tablet Commonly known as: RHEUMATREX 5 tablets by moth twice a day once a week   metoprolol succinate 50 MG 24 hr tablet Commonly known as: TOPROL-XL Take 1 tablet (50 mg total) by mouth daily. Take with or immediately following a meal. What changed:   medication strength  how much to take  additional instructions   montelukast 10 MG tablet Commonly known as: SINGULAIR Take 10 mg by mouth at bedtime.   NovoLOG 100 UNIT/ML injection Generic drug: insulin aspart Inject into the skin See admin instructions. Pt has Insulin pump   pregabalin 75 MG capsule Commonly known as: LYRICA Take 150 mg by mouth at bedtime.   promethazine 25 MG tablet Commonly known as: PHENERGAN Take 25 mg by mouth every 4 (four) hours as needed for nausea or vomiting. Every 4-6 hours as needed   QUEtiapine 50 MG tablet Commonly known as: SEROQUEL Take 1 tablet (50 mg total) by mouth at bedtime.   rosuvastatin 20 MG tablet Commonly known as: CRESTOR Take 20 mg by mouth at bedtime.   Vitamin D (Ergocalciferol) 1.25 MG (50000 UNIT) Caps capsule Commonly known as: DRISDOL Take 50,000 Units by mouth every 7 (seven) days.            Discharge Care Instructions  (From admission, onward)         Start     Ordered   07/06/20 0000   Discharge wound care:       Comments: Monitor the right heel closely and offload pressure from right heel with a pillow under the lower leg as needed.   07/06/20 1326          Follow-up Information    Avva, Ravisankar, MD. Schedule an appointment as soon as possible for a visit in 1 week(s).   Specialty: Internal Medicine Why: Hospital follow up for severe Covid-19 pneumonia and DKA.   Contact information: [address] [place] [address] [phone]  Lorretta Harp, MD .   Specialties: Cardiology, Radiology Contact information: [address] [place] [address] [phone]              Allergies  Allergen Reactions  . Mirtazapine Rash    And hives  . Clindamycin Hives  . Other Hives    Tylox   . Sulfonamide Derivatives Hives  . Lipitor [Atorvastatin] Other (See Comments)    MUSCLE ACHES  . Restasis [Cyclosporine]     Pt stated, "My eyes turned red on the inside and outside of eye; burning sensation"  . Topamax [Topiramate] Itching    Consultations:  PCCM   Procedures/Studies: EEG  Result Date: 06/28/2020 Lora Havens, MD     06/28/2020  4:56 PM Patient Name: NHU SKAHILL MRN: [id number] Epilepsy Attending: Lora Havens Referring Physician/Provider: Dr Donnetta Simpers Date: 06/28/2020 Duration: 22.04 mins Patient history: 55yo F with ams. EEG to evaluate for seizure Level of alertness:  comatose AEDs during EEG study: Propofol Technical aspects: This EEG study was done with scalp electrodes positioned according to the 10-20 International system of electrode placement. Electrical activity was acquired at a sampling rate of 500Hz  and reviewed with a high frequency filter of 70Hz  and a low frequency filter of 1Hz . EEG data were recorded continuously and digitally stored. Description: EEG showed continuous generalized 3 to 6 Hz theta-delta slowing. Intermittent generalized periodic discharges with triphasic morphology at  1-1.5hz  were also noted. Hyperventilation and photic stimulation were not performed.   ABNORMALITY -Continuous slow, generalized -Periodic discharges with triphasic morphology, generalized IMPRESSION: This study showed showed periodic discharges with triphasic morphology which can be on the ictal-interictal continuum but given the morphology and frequency are more likely due to toxic-metabolic etiology. There is also  Severe diffuse encephalopathy, nonspecific etiology. No seizures were seen throughout the recording. Lora Havens   DG Chest 1 View  Result Date: 06/23/2020 CLINICAL DATA:  Intubation EXAM: CHEST  1 VIEW COMPARISON:  None. FINDINGS: The tip of the endotracheal tube is in the proximal right mainstem bronchus. This should be retracted by approximately 3 cm. Bilateral airspace opacities are improved since the prior study. Right subclavian approach central venous catheter tip is at the cavoatrial junction. IMPRESSION: 1. Endotracheal tube tip in the proximal right mainstem bronchus. This should be retracted by approximately 3 cm. 2. Improved aeration of the lungs. Critical Value/emergent results were called by telephone at the time of interpretation on 06/23/2020 at 7:27 pm to provider Luane School, NP, who verbally acknowledged these results. Electronically Signed   By: Ulyses Jarred M.D.   On: 06/23/2020 19:28   DG Chest 2 View  Result Date: 06/20/2020 CLINICAL DATA:  Shortness of breath, COVID positive. Fever and body aches. EXAM: CHEST - 2 VIEW COMPARISON:  05/05/2020 chest x-ray and CT chest. FINDINGS: Patient is slightly rotated. Trachea is midline. Heart size normal. Basilar dependent peribronchovascular airspace opacities. No pleural fluid. IMPRESSION: COVID-19 pneumonia. Electronically Signed   By: Lorin Picket M.D.   On: 06/20/2020 15:35   DG Abd 1 View  Result Date: 06/23/2020 CLINICAL DATA:  Orogastric tube placed EXAM: ABDOMEN - 1 VIEW COMPARISON:  None. FINDINGS: Tip and  side port of the orogastric tube project within the low mid abdomen, likely within a J-shaped stomach. Nonobstructive bowel gas pattern. IMPRESSION: Orogastric tube tip and side port in the stomach. Electronically Signed   By: Ulyses Jarred M.D.   On: 06/23/2020 19:14   CT HEAD WO  CONTRAST  Result Date: 06/28/2020 CLINICAL DATA:  Altered mental status EXAM: CT HEAD WITHOUT CONTRAST TECHNIQUE: Contiguous axial images were obtained from the base of the skull through the vertex without intravenous contrast. COMPARISON:  06/21/2020 FINDINGS: Brain: No evidence of acute infarction, hemorrhage, hydrocephalus, extra-axial collection or mass lesion/mass effect. Scattered low-density changes within the periventricular and subcortical white matter compatible with chronic microvascular ischemic change. Mild diffuse cerebral volume loss. Vascular: No hyperdense vessel or unexpected calcification. Skull: Normal. Negative for fracture or focal lesion. Sinuses/Orbits: No acute finding. Other: None. IMPRESSION: No acute intracranial findings. Electronically Signed   By: Davina Poke D.O.   On: 06/28/2020 15:30   CT HEAD WO CONTRAST  Result Date: 06/21/2020 CLINICAL DATA:  Altered mental status. EXAM: CT HEAD WITHOUT CONTRAST TECHNIQUE: Contiguous axial images were obtained from the base of the skull through the vertex without intravenous contrast. COMPARISON:  May 05, 2020. FINDINGS: Brain: Mild chronic ischemic white matter disease is noted. No mass effect or midline shift is noted. Ventricular size is within normal limits. There is no evidence of mass lesion, hemorrhage or acute infarction. Vascular: No hyperdense vessel or unexpected calcification. Skull: Normal. Negative for fracture or focal lesion. Sinuses/Orbits: No acute finding. Other: None. IMPRESSION: Mild chronic ischemic white matter disease. No acute intracranial abnormality seen. Electronically Signed   By: Marijo Conception M.D.   On: 06/21/2020 12:29    CT ANGIO CHEST PE W OR WO CONTRAST  Result Date: 06/21/2020 CLINICAL DATA:  Suspected pulmonary embolism in a patient with lethargy and decreased level of consciousness recently shown to be positive for COVID 19 infection EXAM: CT ANGIOGRAPHY CHEST WITH CONTRAST TECHNIQUE: Multidetector CT imaging of the chest was performed using the standard protocol during bolus administration of intravenous contrast. Multiplanar CT image reconstructions and MIPs were obtained to evaluate the vascular anatomy. CONTRAST:  71mL OMNIPAQUE IOHEXOL 350 MG/ML SOLN COMPARISON:  05/05/2020 FINDINGS: Cardiovascular: Normal caliber thoracic aorta. Calcified and noncalcified atheromatous plaque in the thoracic aorta. Heart size is normal without pericardial effusion. Lung base assessment markedly limited by respiratory motion. Extensive motion artifact limits assessment without signs of central or lobar level pulmonary embolism. Mediastinum/Nodes: Scattered lymph nodes throughout the chest none with pathologic enlargement, increased size since previous imaging. Fullness of bilateral hilar nodal tissue. Mildly patulous appearance of the esophagus. No thoracic inlet adenopathy or axillary adenopathy. Lungs/Pleura: Diffuse areas of consolidative change, nodularity and ground-glass. No sign of pleural effusion. Findings show midlung and basilar predominance. Airways are patent. Upper Abdomen: Imaged portions of liver, spleen, pancreas, adrenal glands and kidneys without signs of acute process. Assessment of gastrointestinal structures limited as well based on coverage. Musculoskeletal: No acute bone finding or destructive bone process. Levoconvex scoliotic curvature in the upper thoracic spine as on the prior study. Review of the MIP images confirms the above findings. IMPRESSION: 1. Extensive motion artifact limits assessment without signs of central or lobar level pulmonary embolism. 2. Diffuse areas of consolidative change, nodularity  and ground-glass. Findings show midlung and basilar predominance. Findings of multifocal pneumonia in the setting of COVID-19 infection. 3. Mild mediastinal and hilar nodal enlargement likely reactive. 4. Aortic atherosclerosis. Aortic Atherosclerosis (ICD10-I70.0). Electronically Signed   By: Zetta Bills M.D.   On: 06/21/2020 16:30   MR BRAIN W WO CONTRAST  Result Date: 06/21/2020 CLINICAL DATA:  Mental status change.  COVID-19 positive EXAM: MRI HEAD WITHOUT AND WITH CONTRAST TECHNIQUE: Multiplanar, multiecho pulse sequences of the brain and surrounding structures were obtained  without and with intravenous contrast. CONTRAST:  36mL GADAVIST GADOBUTROL 1 MMOL/ML IV SOLN COMPARISON:  CT head 06/21/2020.  MRI head 05/25/2009 FINDINGS: Brain: Negative for acute infarct. Mild atrophy with progression since 2010. Mild white matter changes with scattered subcortical white matter hyperintensities most prominent in the right parietal lobe. This was present previously and is unchanged from 2010. Negative for hemorrhage or mass. Normal enhancement postcontrast administration. Postcontrast images degraded by motion. Vascular: Normal arterial flow voids Skull and upper cervical spine: No focal skeletal lesion. Sinuses/Orbits: Mild mucosal edema paranasal sinuses. Bilateral cataract extraction Other: None IMPRESSION: No acute abnormality. Mild white matter changes similar to 2010. Mild atrophy with progression since 2010. Electronically Signed   By: Franchot Gallo M.D.   On: 06/21/2020 21:01   DG Chest Port 1 View  Result Date: 06/27/2020 CLINICAL DATA:  Endotracheal tube EXAM: PORTABLE CHEST 1 VIEW COMPARISON:  02/24/2020 FINDINGS: The endotracheal tube terminates above the carina. The enteric tube extends below the left hemidiaphragm. There are persistent bilateral airspace opacities, not substantially changed from prior study. The right-sided PICC line is unchanged there is no pneumothorax. There are probable small  bilateral pleural effusions. The esophageal temperature probe terminates in the mid esophagus level. IMPRESSION: No significant interval change. Electronically Signed   By: Constance Holster M.D.   On: 06/27/2020 04:27   DG Chest Port 1 View  Result Date: 06/25/2020 CLINICAL DATA:  Endotracheal 2 EXAM: PORTABLE CHEST 1 VIEW COMPARISON:  06/24/2020 FINDINGS: The endotracheal tube terminates just above the carina by approximately 1.7 cm. The enteric tube extends below the left hemidiaphragm. The right-sided PICC line appears to be well position. There are hazy airspace opacities at the lung bases. There is no pneumothorax. No definite large pleural effusion. The esophageal temperature probe appears to be looped within the patient's oral cavity. The tip projects over the proximal esophagus. IMPRESSION: 1. Lines and tubes as above. The esophageal temperature probe appears to be looped within the patient's oral cavity. The tip projects over the proximal esophagus. Correlation with physical exam is recommended. Consider repositioning as clinically indicated. 2. Persistent hazy bibasilar airspace opacities. These results will be called to the ordering clinician or representative by the Radiologist Assistant, and communication documented in the PACS or [employer]. Electronically Signed   By: Constance Holster M.D.   On: 06/25/2020 06:00   DG Chest Port 1 View  Result Date: 06/24/2020 CLINICAL DATA:  55 year old female respiratory failure. Hypoxia. Positive COVID-19. EXAM: PORTABLE CHEST 1 VIEW COMPARISON:  Portable chest 06/23/2020 and earlier. FINDINGS: Portable AP semi upright view at 0420 hours. Endotracheal tube tip has been pulled back and now projects about 9 mm above the carina. Stable visible enteric tube, and stable right PICC line. There is also a guidewire in place along the course of both tubes, terminating at the thoracic inlet level. Stable lung volumes. Mediastinal contours remain normal. No  pneumothorax or pleural effusion. Coarse bilateral pulmonary interstitial opacity with confluence in the bilateral lung periphery and bases. Bibasilar ventilation has improved since 06/21/2020. No significant change from yesterday. No acute osseous abnormality identified. Negative visible bowel gas pattern. IMPRESSION: 1. ETT repositioned and tip now about 9 mm above the carina. 2. There is a now a guidewire in place along the proximal ET and NG tubes, terminating at the thoracic inlet. 3. Stable visible enteric tube, right PICC line. 4. Stable bilateral COVID-19 pneumonia from yesterday. Ventilation improved at both lung bases since 06/21/2020. Electronically Signed   By:  Genevie Ann M.D.   On: 06/24/2020 04:57   DG Chest Port 1 View  Result Date: 06/21/2020 CLINICAL DATA:  Weakness.  Recent COVID-19 positive EXAM: PORTABLE CHEST 1 VIEW COMPARISON:  June 20, 2020 FINDINGS: There are now multiple areas of airspace opacity throughout the lung bases as well as in the right upper lobe and to a lesser extent left upper lobe and mid lung regions. Heart is upper normal in size with pulmonary vascularity normal. No adenopathy. No bone lesions. IMPRESSION: Multiple foci of airspace opacity consistent with multifocal pneumonia, likely of atypical organism etiology. There has been significant increase in areas of opacity compared to 1 day prior. Heart upper normal in size. No adenopathy evident. Electronically Signed   By: Lowella Grip III M.D.   On: 06/21/2020 14:42   ECHOCARDIOGRAM COMPLETE  Result Date: 06/24/2020    ECHOCARDIOGRAM REPORT   Patient Name:   TEVIN BENNIS Date of Exam: 06/24/2020 Medical Rec #:  [id number]         Height:       59.0 in Accession #:    [id number]        Weight:       144.4 lb Date of Birth:  1965-06-21          BSA:          1.606 m Patient Age:    13 years          BP:           111/56 mmHg Patient Gender: F                 HR:           82 bpm. Exam Location:  [facility] Procedure:  2D Echo, Color Doppler and Cardiac Doppler Indications:     R06.03 Acute respiratory distress  History:         Patient has prior history of Echocardiogram examinations. Risk                  Factors:Sleep Apnea, Hypertension, Diabetes and Dyslipidemia.                  Pt tested positive for COVID-19 on 06/21/20.  Sonographer:     Charmayne Sheer RDCS (AE) Referring Phys:  [id number] Flora Lipps Diagnosing Phys: Kathlyn Sacramento MD  Sonographer Comments: Suboptimal parasternal window, suboptimal subcostal window and echo performed with patient supine and on artificial respirator. IMPRESSIONS  1. Left ventricular ejection fraction, by estimation, is 70 to 75%. The left ventricle has hyperdynamic function. The left ventricle has no regional wall motion abnormalities. Left ventricular diastolic parameters were normal.  2. Right ventricular systolic function is normal. The right ventricular size is normal. Tricuspid regurgitation signal is inadequate for assessing PA pressure.  3. The mitral valve is normal in structure. No evidence of mitral valve regurgitation. No evidence of mitral stenosis.  4. The aortic valve is normal in structure. Aortic valve regurgitation is not visualized. No aortic stenosis is present.  5. The inferior vena cava is normal in size with greater than 50% respiratory variability, suggesting right atrial pressure of 3 mmHg. FINDINGS  Left Ventricle: Left ventricular ejection fraction, by estimation, is 70 to 75%. The left ventricle has hyperdynamic function. The left ventricle has no regional wall motion abnormalities. The left ventricular internal cavity size was normal in size. There is no left ventricular hypertrophy. Left ventricular diastolic parameters were normal. Right Ventricle: The right ventricular size is  normal. No increase in right ventricular wall thickness. Right ventricular systolic function is normal. Tricuspid regurgitation signal is inadequate for assessing PA pressure. Left Atrium: Left  atrial size was normal in size. Right Atrium: Right atrial size was normal in size. Pericardium: There is no evidence of pericardial effusion. Mitral Valve: The mitral valve is normal in structure. No evidence of mitral valve regurgitation. No evidence of mitral valve stenosis. MV peak gradient, 3.3 mmHg. The mean mitral valve gradient is 2.0 mmHg. Tricuspid Valve: The tricuspid valve is normal in structure. Tricuspid valve regurgitation is not demonstrated. No evidence of tricuspid stenosis. Aortic Valve: The aortic valve is normal in structure. Aortic valve regurgitation is not visualized. No aortic stenosis is present. Aortic valve mean gradient measures 6.0 mmHg. Aortic valve peak gradient measures 12.4 mmHg. Aortic valve area, by VTI measures 2.44 cm. Pulmonic Valve: The pulmonic valve was normal in structure. Pulmonic valve regurgitation is not visualized. No evidence of pulmonic stenosis. Aorta: The aortic root is normal in size and structure. Venous: The inferior vena cava is normal in size with greater than 50% respiratory variability, suggesting right atrial pressure of 3 mmHg. IAS/Shunts: No atrial level shunt detected by color flow Doppler.  LEFT VENTRICLE PLAX 2D LVIDd:         3.34 cm     Diastology LVIDs:         2.60 cm     LV e' medial:    6.64 cm/s LV PW:         0.93 cm     LV E/e' medial:  11.2 LV IVS:        0.69 cm     LV e' lateral:   7.29 cm/s LVOT diam:     2.00 cm     LV E/e' lateral: 10.2 LV SV:         72 LV SV Index:   45 LVOT Area:     3.14 cm  LV Volumes (MOD) LV vol d, MOD A2C: 46.1 ml LV vol d, MOD A4C: 46.8 ml LV vol s, MOD A2C: 17.3 ml LV vol s, MOD A4C: 18.0 ml LV SV MOD A2C:     28.8 ml LV SV MOD A4C:     46.8 ml LV SV MOD BP:      28.9 ml RIGHT VENTRICLE RV Basal diam:  2.39 cm RV S prime:     12.60 cm/s TAPSE (M-mode): 1.9 cm LEFT ATRIUM             Index       RIGHT ATRIUM          Index LA diam:        2.40 cm 1.49 cm/m  RA Area:     8.28 cm LA Vol (A2C):   22.6 ml 14.07  ml/m RA Volume:   15.00 ml 9.34 ml/m LA Vol (A4C):   19.5 ml 12.14 ml/m LA Biplane Vol: 21.4 ml 13.33 ml/m  AORTIC VALVE                    PULMONIC VALVE AV Area (Vmax):    2.71 cm     PV Vmax:       1.09 m/s AV Area (Vmean):   2.58 cm     PV Vmean:      70.400 cm/s AV Area (VTI):     2.44 cm     PV VTI:        0.145 m AV  Vmax:           176.00 cm/s  PV Peak grad:  4.8 mmHg AV Vmean:          115.000 cm/s PV Mean grad:  2.0 mmHg AV VTI:            0.293 m AV Peak Grad:      12.4 mmHg AV Mean Grad:      6.0 mmHg LVOT Vmax:         152.00 cm/s LVOT Vmean:        94.300 cm/s LVOT VTI:          0.228 m LVOT/AV VTI ratio: 0.78  AORTA Ao Root diam: 2.90 cm MITRAL VALVE MV Area (PHT): 3.00 cm    SHUNTS MV Area VTI:   3.05 cm    Systemic VTI:  0.23 m MV Peak grad:  3.3 mmHg    Systemic Diam: 2.00 cm MV Mean grad:  2.0 mmHg MV Vmax:       0.90 m/s MV Vmean:      63.7 cm/s MV Decel Time: 253 msec MV E velocity: 74.10 cm/s MV A velocity: 61.70 cm/s MV E/A ratio:  1.20 Kathlyn Sacramento MD Electronically signed by Kathlyn Sacramento MD Signature Date/Time: 06/24/2020/11:12:34 AM    Final    [place] EKG SITE RITE  Result Date: 06/23/2020 If Site Rite image not attached, placement could not be confirmed due to current cardiac rhythm.      Subjective: Pt seen this AM, mildly confused, pleasant mood.  Feels ready to go home.  Denies any shortness of breath.  Does feel weak.  No acute complaints.   Discharge Exam: Vitals:   07/06/20 0719 07/06/20 1212  BP: (!) 112/58 122/74  Pulse: 99 100  Resp: 18 18  Temp: 98.2 F (36.8 C) 98.4 F (36.9 C)  SpO2: 99% 100%   Vitals:   07/05/20 2342 07/06/20 0406 07/06/20 0719 07/06/20 1212  BP: 118/71 (!) 122/57 (!) 112/58 122/74  Pulse: (!) 106 93 99 100  Resp: 16 16 18 18   Temp: 98.1 F (36.7 C) 98.1 F (36.7 C) 98.2 F (36.8 C) 98.4 F (36.9 C)  TempSrc:   Oral   SpO2: 97% 100% 99% 100%  Weight:      Height:        General: Pt is alert, awake, not in acute  distress Cardiovascular: RRR, S1/S2 +, no rubs, no gallops Respiratory: CTA bilaterally, no wheezing, no rhonchi Abdominal: Soft, NT, ND, bowel sounds + Extremities: no edema, no cyanosis    The results of significant diagnostics from this hospitalization (including imaging, microbiology, ancillary and laboratory) are listed below for reference.     Microbiology: No results found for this or any previous visit (from the past 240 hour(s)).   Labs: BNP (last 3 results) Recent Labs    06/21/20 0046  BNP [id number]*   Basic Metabolic Panel: No results for input(s): NA, K, CL, CO2, GLUCOSE, BUN, CREATININE, CALCIUM, MG, PHOS in the last 168 hours. Liver Function Tests: No results for input(s): AST, ALT, ALKPHOS, BILITOT, PROT, ALBUMIN in the last 168 hours. No results for input(s): LIPASE, AMYLASE in the last 168 hours. No results for input(s): AMMONIA in the last 168 hours. CBC: No results for input(s): WBC, NEUTROABS, HGB, HCT, MCV, PLT in the last 168 hours. Cardiac Enzymes: No results for input(s): CKTOTAL, CKMB, CKMBINDEX, TROPONINI in the last 168 hours. BNP: Invalid input(s): POCBNP CBG: No results for input(s): GLUCAP in  the last 168 hours. D-Dimer No results for input(s): DDIMER in the last 72 hours. Hgb A1c No results for input(s): HGBA1C in the last 72 hours. Lipid Profile No results for input(s): CHOL, HDL, LDLCALC, TRIG, CHOLHDL, LDLDIRECT in the last 72 hours. Thyroid function studies No results for input(s): TSH, T4TOTAL, T3FREE, THYROIDAB in the last 72 hours.  Invalid input(s): FREET3 Anemia work up No results for input(s): VITAMINB12, FOLATE, FERRITIN, TIBC, IRON, RETICCTPCT in the last 72 hours. Urinalysis    Component Value Date/Time   COLORURINE STRAW (A) 06/20/2020 1435   APPEARANCEUR CLEAR (A) 06/20/2020 1435   LABSPEC 1.017 06/20/2020 1435   PHURINE 5.0 06/20/2020 1435   GLUCOSEU >=500 (A) 06/20/2020 1435   HGBUR LARGE (A) 06/20/2020 1435    BILIRUBINUR NEGATIVE 06/20/2020 1435   KETONESUR 80 (A) 06/20/2020 1435   PROTEINUR 100 (A) 06/20/2020 1435   UROBILINOGEN 0.2 01/11/2010 1909   NITRITE NEGATIVE 06/20/2020 1435   LEUKOCYTESUR NEGATIVE 06/20/2020 1435   Sepsis Labs Invalid input(s): PROCALCITONIN,  WBC,  LACTICIDVEN Microbiology No results found for this or any previous visit (from the past 240 hour(s)).   Time coordinating discharge: Over 30 minutes  SIGNED:   Ezekiel Slocumb, DO [facility] 07/15/2020, 3:03 PM   If 7PM-7AM, please contact night-coverage [email or web address]

## 2020-07-07 ENCOUNTER — Other Ambulatory Visit: Payer: Self-pay

## 2020-07-07 DIAGNOSIS — J159 Unspecified bacterial pneumonia: Secondary | ICD-10-CM | POA: Diagnosis not present

## 2020-07-07 DIAGNOSIS — G9341 Metabolic encephalopathy: Secondary | ICD-10-CM | POA: Diagnosis not present

## 2020-07-07 DIAGNOSIS — J1282 Pneumonia due to coronavirus disease 2019: Secondary | ICD-10-CM | POA: Diagnosis not present

## 2020-07-07 DIAGNOSIS — L89312 Pressure ulcer of right buttock, stage 2: Secondary | ICD-10-CM | POA: Diagnosis not present

## 2020-07-07 DIAGNOSIS — J4540 Moderate persistent asthma, uncomplicated: Secondary | ICD-10-CM | POA: Diagnosis not present

## 2020-07-07 DIAGNOSIS — I10 Essential (primary) hypertension: Secondary | ICD-10-CM | POA: Diagnosis not present

## 2020-07-07 DIAGNOSIS — E1151 Type 2 diabetes mellitus with diabetic peripheral angiopathy without gangrene: Secondary | ICD-10-CM | POA: Diagnosis not present

## 2020-07-07 DIAGNOSIS — E11610 Type 2 diabetes mellitus with diabetic neuropathic arthropathy: Secondary | ICD-10-CM | POA: Diagnosis not present

## 2020-07-07 DIAGNOSIS — U071 COVID-19: Secondary | ICD-10-CM | POA: Diagnosis not present

## 2020-07-07 NOTE — Patient Outreach (Addendum)
[facility]) Care Management  07/07/2020  FRANCHESCA VENEZIANO 1965/07/19 [id number]     Transition of Care Referral  Referral Date: 07/07/2020 Referral Source: [facility] Liaison Date of Discharge: 07/06/2020 Facility: [place]: Mcarthur Rossetti Medicare PCP Office Does [employer]   Outreach attempt # 1 to patient. Spoke with spouse who reported that patient was resting and doing well since returning home. He states that she is trying to clean and do a lot of the things she was doing prior. He has talked with patient that she needs to rest and take it easy. He reports that patient was able to push herself in wheelchair from bedroom to living room last evening. He confirms that patient has all her meds and no issues with them. Her insulin pump is working and they are monitoring cbgs. Spouse voices that patient ate a fairly good breakfast this morning. He is adhering to thickened liquids and aware of s/s of aspiration and precaution measures. He states that he called PCP office this morning is awaiting call back regarding appt and to discuss insulin pump settings. He states HH called already and RN will be coming out later today. Patient also scheduled to get HHPT, OT,speech and aide. [facility] services reviewed and discussed. Spouse reports that is a little overwhelming all the help/support and all the people, coming in to assist patient. He was provided with RN CM contact info and made aware that he could call back anytime if needed. He voiced understanding and appreciation.     Plan: RN CM will close case at this time.    Enzo Montgomery, RN,BSN,CCM [facility] Telephonic Care Management Coordinator Direct Phone: [phone] Toll Free: [phone] Fax: [phone]

## 2020-07-08 DIAGNOSIS — E11610 Type 2 diabetes mellitus with diabetic neuropathic arthropathy: Secondary | ICD-10-CM | POA: Diagnosis not present

## 2020-07-08 DIAGNOSIS — U071 COVID-19: Secondary | ICD-10-CM | POA: Diagnosis not present

## 2020-07-08 DIAGNOSIS — J159 Unspecified bacterial pneumonia: Secondary | ICD-10-CM | POA: Diagnosis not present

## 2020-07-08 DIAGNOSIS — L89312 Pressure ulcer of right buttock, stage 2: Secondary | ICD-10-CM | POA: Diagnosis not present

## 2020-07-08 DIAGNOSIS — E1151 Type 2 diabetes mellitus with diabetic peripheral angiopathy without gangrene: Secondary | ICD-10-CM | POA: Diagnosis not present

## 2020-07-08 DIAGNOSIS — G9341 Metabolic encephalopathy: Secondary | ICD-10-CM | POA: Diagnosis not present

## 2020-07-08 DIAGNOSIS — J4540 Moderate persistent asthma, uncomplicated: Secondary | ICD-10-CM | POA: Diagnosis not present

## 2020-07-08 DIAGNOSIS — J1282 Pneumonia due to coronavirus disease 2019: Secondary | ICD-10-CM | POA: Diagnosis not present

## 2020-07-08 DIAGNOSIS — I10 Essential (primary) hypertension: Secondary | ICD-10-CM | POA: Diagnosis not present

## 2020-07-12 DIAGNOSIS — E11610 Type 2 diabetes mellitus with diabetic neuropathic arthropathy: Secondary | ICD-10-CM | POA: Diagnosis not present

## 2020-07-12 DIAGNOSIS — J159 Unspecified bacterial pneumonia: Secondary | ICD-10-CM | POA: Diagnosis not present

## 2020-07-12 DIAGNOSIS — L89312 Pressure ulcer of right buttock, stage 2: Secondary | ICD-10-CM | POA: Diagnosis not present

## 2020-07-12 DIAGNOSIS — J1282 Pneumonia due to coronavirus disease 2019: Secondary | ICD-10-CM | POA: Diagnosis not present

## 2020-07-12 DIAGNOSIS — U071 COVID-19: Secondary | ICD-10-CM | POA: Diagnosis not present

## 2020-07-12 DIAGNOSIS — E1151 Type 2 diabetes mellitus with diabetic peripheral angiopathy without gangrene: Secondary | ICD-10-CM | POA: Diagnosis not present

## 2020-07-12 DIAGNOSIS — J4540 Moderate persistent asthma, uncomplicated: Secondary | ICD-10-CM | POA: Diagnosis not present

## 2020-07-12 DIAGNOSIS — I10 Essential (primary) hypertension: Secondary | ICD-10-CM | POA: Diagnosis not present

## 2020-07-12 DIAGNOSIS — G9341 Metabolic encephalopathy: Secondary | ICD-10-CM | POA: Diagnosis not present

## 2020-07-13 DIAGNOSIS — J1282 Pneumonia due to coronavirus disease 2019: Secondary | ICD-10-CM | POA: Diagnosis not present

## 2020-07-13 DIAGNOSIS — J159 Unspecified bacterial pneumonia: Secondary | ICD-10-CM | POA: Diagnosis not present

## 2020-07-13 DIAGNOSIS — E11610 Type 2 diabetes mellitus with diabetic neuropathic arthropathy: Secondary | ICD-10-CM | POA: Diagnosis not present

## 2020-07-13 DIAGNOSIS — I10 Essential (primary) hypertension: Secondary | ICD-10-CM | POA: Diagnosis not present

## 2020-07-13 DIAGNOSIS — J4540 Moderate persistent asthma, uncomplicated: Secondary | ICD-10-CM | POA: Diagnosis not present

## 2020-07-13 DIAGNOSIS — G9341 Metabolic encephalopathy: Secondary | ICD-10-CM | POA: Diagnosis not present

## 2020-07-13 DIAGNOSIS — U071 COVID-19: Secondary | ICD-10-CM | POA: Diagnosis not present

## 2020-07-13 DIAGNOSIS — M6281 Muscle weakness (generalized): Secondary | ICD-10-CM | POA: Diagnosis not present

## 2020-07-13 DIAGNOSIS — L89610 Pressure ulcer of right heel, unstageable: Secondary | ICD-10-CM | POA: Diagnosis not present

## 2020-07-13 DIAGNOSIS — J9601 Acute respiratory failure with hypoxia: Secondary | ICD-10-CM | POA: Diagnosis not present

## 2020-07-13 DIAGNOSIS — E1040 Type 1 diabetes mellitus with diabetic neuropathy, unspecified: Secondary | ICD-10-CM | POA: Diagnosis not present

## 2020-07-13 DIAGNOSIS — L89312 Pressure ulcer of right buttock, stage 2: Secondary | ICD-10-CM | POA: Diagnosis not present

## 2020-07-13 DIAGNOSIS — F17200 Nicotine dependence, unspecified, uncomplicated: Secondary | ICD-10-CM | POA: Diagnosis not present

## 2020-07-13 DIAGNOSIS — E1151 Type 2 diabetes mellitus with diabetic peripheral angiopathy without gangrene: Secondary | ICD-10-CM | POA: Diagnosis not present

## 2020-07-14 DIAGNOSIS — J159 Unspecified bacterial pneumonia: Secondary | ICD-10-CM | POA: Diagnosis not present

## 2020-07-14 DIAGNOSIS — I10 Essential (primary) hypertension: Secondary | ICD-10-CM | POA: Diagnosis not present

## 2020-07-14 DIAGNOSIS — J4540 Moderate persistent asthma, uncomplicated: Secondary | ICD-10-CM | POA: Diagnosis not present

## 2020-07-14 DIAGNOSIS — U071 COVID-19: Secondary | ICD-10-CM | POA: Diagnosis not present

## 2020-07-14 DIAGNOSIS — L89312 Pressure ulcer of right buttock, stage 2: Secondary | ICD-10-CM | POA: Diagnosis not present

## 2020-07-14 DIAGNOSIS — E11610 Type 2 diabetes mellitus with diabetic neuropathic arthropathy: Secondary | ICD-10-CM | POA: Diagnosis not present

## 2020-07-14 DIAGNOSIS — E1151 Type 2 diabetes mellitus with diabetic peripheral angiopathy without gangrene: Secondary | ICD-10-CM | POA: Diagnosis not present

## 2020-07-14 DIAGNOSIS — J1282 Pneumonia due to coronavirus disease 2019: Secondary | ICD-10-CM | POA: Diagnosis not present

## 2020-07-14 DIAGNOSIS — G9341 Metabolic encephalopathy: Secondary | ICD-10-CM | POA: Diagnosis not present

## 2020-07-18 DIAGNOSIS — U071 COVID-19: Secondary | ICD-10-CM | POA: Diagnosis not present

## 2020-07-18 DIAGNOSIS — G9341 Metabolic encephalopathy: Secondary | ICD-10-CM | POA: Diagnosis not present

## 2020-07-18 DIAGNOSIS — J159 Unspecified bacterial pneumonia: Secondary | ICD-10-CM | POA: Diagnosis not present

## 2020-07-18 DIAGNOSIS — I10 Essential (primary) hypertension: Secondary | ICD-10-CM | POA: Diagnosis not present

## 2020-07-18 DIAGNOSIS — L89312 Pressure ulcer of right buttock, stage 2: Secondary | ICD-10-CM | POA: Diagnosis not present

## 2020-07-18 DIAGNOSIS — E11610 Type 2 diabetes mellitus with diabetic neuropathic arthropathy: Secondary | ICD-10-CM | POA: Diagnosis not present

## 2020-07-18 DIAGNOSIS — J4540 Moderate persistent asthma, uncomplicated: Secondary | ICD-10-CM | POA: Diagnosis not present

## 2020-07-18 DIAGNOSIS — E1151 Type 2 diabetes mellitus with diabetic peripheral angiopathy without gangrene: Secondary | ICD-10-CM | POA: Diagnosis not present

## 2020-07-18 DIAGNOSIS — J1282 Pneumonia due to coronavirus disease 2019: Secondary | ICD-10-CM | POA: Diagnosis not present

## 2020-07-19 DIAGNOSIS — I10 Essential (primary) hypertension: Secondary | ICD-10-CM | POA: Diagnosis not present

## 2020-07-19 DIAGNOSIS — G9341 Metabolic encephalopathy: Secondary | ICD-10-CM | POA: Diagnosis not present

## 2020-07-19 DIAGNOSIS — U071 COVID-19: Secondary | ICD-10-CM | POA: Diagnosis not present

## 2020-07-19 DIAGNOSIS — E11610 Type 2 diabetes mellitus with diabetic neuropathic arthropathy: Secondary | ICD-10-CM | POA: Diagnosis not present

## 2020-07-19 DIAGNOSIS — J159 Unspecified bacterial pneumonia: Secondary | ICD-10-CM | POA: Diagnosis not present

## 2020-07-19 DIAGNOSIS — E1151 Type 2 diabetes mellitus with diabetic peripheral angiopathy without gangrene: Secondary | ICD-10-CM | POA: Diagnosis not present

## 2020-07-19 DIAGNOSIS — L89312 Pressure ulcer of right buttock, stage 2: Secondary | ICD-10-CM | POA: Diagnosis not present

## 2020-07-19 DIAGNOSIS — J1282 Pneumonia due to coronavirus disease 2019: Secondary | ICD-10-CM | POA: Diagnosis not present

## 2020-07-19 DIAGNOSIS — J4540 Moderate persistent asthma, uncomplicated: Secondary | ICD-10-CM | POA: Diagnosis not present

## 2020-07-21 DIAGNOSIS — U071 COVID-19: Secondary | ICD-10-CM | POA: Diagnosis not present

## 2020-07-21 DIAGNOSIS — J1282 Pneumonia due to coronavirus disease 2019: Secondary | ICD-10-CM | POA: Diagnosis not present

## 2020-07-21 DIAGNOSIS — G9341 Metabolic encephalopathy: Secondary | ICD-10-CM | POA: Diagnosis not present

## 2020-07-21 DIAGNOSIS — L89312 Pressure ulcer of right buttock, stage 2: Secondary | ICD-10-CM | POA: Diagnosis not present

## 2020-07-21 DIAGNOSIS — I10 Essential (primary) hypertension: Secondary | ICD-10-CM | POA: Diagnosis not present

## 2020-07-21 DIAGNOSIS — E11610 Type 2 diabetes mellitus with diabetic neuropathic arthropathy: Secondary | ICD-10-CM | POA: Diagnosis not present

## 2020-07-21 DIAGNOSIS — J159 Unspecified bacterial pneumonia: Secondary | ICD-10-CM | POA: Diagnosis not present

## 2020-07-21 DIAGNOSIS — J4540 Moderate persistent asthma, uncomplicated: Secondary | ICD-10-CM | POA: Diagnosis not present

## 2020-07-21 DIAGNOSIS — E1151 Type 2 diabetes mellitus with diabetic peripheral angiopathy without gangrene: Secondary | ICD-10-CM | POA: Diagnosis not present

## 2020-07-24 NOTE — Progress Notes (Signed)
Cardiology Clinic Note   Patient Name: Ashley [name] Date of Encounter: 07/25/2020  Primary Care Provider:  Prince Solian, MD Primary Cardiologist:  Quay Burow, MD  Patient Profile    Ashley [name] 55 year old female presents the clinic today for follow-up evaluation of her essential hypertension and peripheral arterial disease.  Past Medical History    Past Medical History:  Diagnosis Date  . Anxiety   . Asthma   . Depression   . Diabetes mellitus ([place])   . Esophageal stricture   . Gastroparesis   . GERD (gastroesophageal reflux disease)   . Glaucoma   . Graves disease 02/2010   diagnosed by radioactive iodine uptake and scan   . Helicobacter pylori infection   . Hyperlipidemia   . Hypertension   . Hypothyroidism   . Insulin pump in place   . Nephrolithiasis 2010   rt sided per GI  . Neuropathy    diabetic neuropathy  . Osteoporosis   . Sleep apnea   . Sleep apnea with use of continuous positive airway pressure (CPAP)   . Vitamin D deficiency    Past Surgical History:  Procedure Laterality Date  . ABDOMINAL AORTOGRAM W/LOWER EXTREMITY Bilateral 01/01/2019   Procedure: ABDOMINAL AORTOGRAM W/LOWER EXTREMITY;  Surgeon: Lorretta Harp, MD;  Location: [facility];  Service: Cardiovascular;  Laterality: Bilateral;  . BREAST EXCISIONAL BIOPSY    . BREAST LUMPECTOMY Left    LEFT-BENIGN  . NASAL SEPTOPLASTY W/ TURBINOPLASTY Bilateral 09/07/2013   Procedure: SEPTOPLASTY, BILATERAL TURBINATE RESECTION ;  Surgeon: Ascencion Dike, MD;  Location: [facility];  Service: ENT;  Laterality: Bilateral;  . PERIPHERAL VASCULAR INTERVENTION Bilateral 01/01/2019   Procedure: PERIPHERAL VASCULAR INTERVENTION;  Surgeon: Lorretta Harp, MD;  Location: [facility];  Service: Cardiovascular;  Laterality: Bilateral;  . UPPER GASTROINTESTINAL ENDOSCOPY    . VAGINAL HYSTERECTOMY  2006    Allergies  Allergies  Allergen Reactions  . Mirtazapine  Rash    And hives  . Clindamycin Hives  . Other Hives    Tylox   . Sulfonamide Derivatives Hives  . Lipitor [Atorvastatin] Other (See Comments)    MUSCLE ACHES  . Restasis [Cyclosporine]     Pt stated, "My eyes turned red on the inside and outside of eye; burning sensation"  . Topamax [Topiramate] Itching    History of Present Illness    [name] has a PMH of essential hypertension, peripheral arterial disease, OSA on CPAP, persistent moderate asthma, pneumonia due to COVID-19 virus, GERD, chest tightness, endotracheal tube, and hyperlipidemia.  She also has uncontrolled diabetes and has been on an insulin pump for over 20 years.  She was seen by Dr. Alvester Chou and then that have bilateral lower extremity lifestyle limiting claudication for over 2 years.  She was also seen by orthopedics for neurovascular pain.  Her lower extremity Dopplers 12/16/2018 showed right ABI 0.64 and a left of 0.55.  She had high-grade bilateral iliac disease.  Peripheral angiography by Dr. Gwenlyn Found on 01/01/2019 showed high-grade distal abdominal aortic stenosis.  Bilateral PTA and covered stenting using the kissing stent technique and rebuilding the aortic bifurcation was completed.  On follow-up her lower extremity Dopplers normalized and her claudication had resolved.  She was noted to have some lower extremity swelling that was felt to be related to increased blood flow as a result of her intervention.  She was placed on low-dose diuretics and her amlodipine was decreased.  She remained  on dual antiplatelet therapy.  It was noted that she continued to smoke cigarettes.  She was last seen by Dr. Gwenlyn Found on 02/24/2020.  During that time she was doing well.  She continued to smoke about half pack per day.  She denied chest pain, shortness of breath, and claudication.  She did report lower extremity intermittent periods of edema.  Her PCP had doubled her amlodipine.  Her lower extremity arterial Dopplers 02/02/2020 showed normal  ABIs with increased velocities her left iliac artery.   She was admitted to [facility] 06/22/2020 and discharged on 07/06/2020 with DKA and gastroparesis.  She required intubation on 1/13 and was extubated on 1/20.  During her hospital admission she required insulin drip.  She was also positive for COVID-19 and completed her treatment of remdesivir, steroids, and a course of Unasyn.  It was initially recommended she go to SNF following hospital discharge.  However, she was discharged home.  She required 2 person assist.  The patient and her husband decided to go home with private aides 24/7.  She was contacted by patient out reach on 07/07/2020 and was doing well at that time.  She was trying to do activities that she was doing prior to discharge.  She presents the clinic today for follow-up evaluation states she continues to improve since being hospitalized.  She has been working with physical therapy 1-2 times per week occupational therapy 1 times per week and also been working with speech therapy.  On days when she does not have physical therapy she does her physical therapy exercises on her own.  Her husband reports that there is a swallow study planned through speech therapy.  She is using her walker today and states that she does have increased fatigue however, she was using a wheelchair at discharge.  She reports that she does have some left leg pain but reports it is stable from when she saw Dr. Gwenlyn Found last.  She does have follow-up LAE's and ABIs scheduled in August.  I will give her the salty 6 diet sheet, have her continue to increase her physical activity as tolerated, give her the [place] support stocking sheet, have recommended elevating her lower extremities when not active, and will have her follow-up with Dr. Gwenlyn Found after her lower extremity ultrasounds.  Today she denies chest pain, increased shortness of breath, increased lower extremity edema, fatigue, palpitations, melena, hematuria,  hemoptysis, diaphoresis, weakness, presyncope, syncope, orthopnea, and PND.   Home Medications    Prior to Admission medications   Medication Sig Start Date End Date Taking? Authorizing Provider  ACCU-CHEK COMPACT PLUS test strip 1 each by Other route as needed.  10/20/12   [provider]  ACCU-CHEK SOFTCLIX LANCETS lancets  05/19/18   [provider]  albuterol (PROVENTIL) (2.5 MG/3ML) 0.083% nebulizer solution Take 2.5 mg by nebulization every 4 (four) hours as needed for wheezing or shortness of breath.  03/18/15   [provider]  ALPRAZolam Duanne Moron) 0.5 MG tablet Take 0.5 mg by mouth 3 (three) times daily as needed for anxiety.  10/08/12   [provider]  AMBULATORY NON FORMULARY MEDICATION NOVOLOG INSULIN PUMP USES AS DIRECTED    [provider]  busPIRone (BUSPAR) 15 MG tablet Take 15 mg by mouth 2 (two) times daily. 05/25/20   [provider]  clopidogrel (PLAVIX) 75 MG tablet TAKE 1 TABLET (75 MG TOTAL) BY MOUTH DAILY WITH BREAKFAST. 12/24/19   Lorretta Harp, MD  docusate (COLACE) 50 MG/5ML liquid  Take 10 mLs (100 mg total) by mouth 2 (two) times daily. Hold if having loose or frequent stools. 07/06/20   Ezekiel Slocumb, DO  DULoxetine (CYMBALTA) 30 MG capsule Take 30 mg by mouth every morning. Takes 90 mg total 01/22/18   [provider]  DULoxetine (CYMBALTA) 60 MG capsule Take 60 mg by mouth every morning. Takes 90 mg total    [provider]  estradiol (ESTRACE) 0.5 MG tablet Take 0.5 mg by mouth daily. 04/07/20   [provider]  famotidine (PEPCID) 20 MG tablet Take 2 tablets (40 mg total) by mouth 2 (two) times daily. 07/06/20   Ezekiel Slocumb, DO  folic acid (FOLVITE) 1 MG tablet Take 1 tablet by mouth daily. 06/23/19   [provider]  ibandronate (BONIVA) 150 MG tablet Take 150 mg by mouth every 30 (thirty) days. 03/23/20   [provider]  Insulin Human (INSULIN PUMP) SOLN  Inject into the skin.    [provider]  lansoprazole (PREVACID) 30 MG capsule Take 1 capsule (30 mg total) by mouth in the morning and at bedtime. 12/04/19   Irene Shipper, MD  latanoprost (XALATAN) 0.005 % ophthalmic solution Place 1 drop into both eyes at bedtime. 12/26/17   [provider]  levocetirizine (XYZAL) 5 MG tablet Take 5 mg by mouth every morning.     [provider]  levothyroxine (SYNTHROID, LEVOTHROID) 112 MCG tablet Take 112 mcg by mouth daily before breakfast.  01/07/18   [provider]  methotrexate (RHEUMATREX) 2.5 MG tablet 5 tablets by moth twice a day once a week 06/23/19   [provider]  metoprolol succinate (TOPROL-XL) 50 MG 24 hr tablet Take 1 tablet (50 mg total) by mouth daily. Take with or immediately following a meal. 07/07/20   Nicole Kindred A, DO  montelukast (SINGULAIR) 10 MG tablet Take 10 mg by mouth at bedtime.    [provider]  NOVOLOG 100 UNIT/ML injection Inject into the skin See admin instructions. Pt has Insulin pump 08/06/18   [provider]  Nutritional Supplements (FEEDING SUPPLEMENT, NEPRO CARB STEADY,) LIQD Take 237 mLs by mouth 3 (three) times daily between meals. 07/06/20   Ezekiel Slocumb, DO  pregabalin (LYRICA) 75 MG capsule Take 150 mg by mouth at bedtime.  07/24/18   [provider]  promethazine (PHENERGAN) 25 MG tablet Take 25 mg by mouth every 4 (four) hours as needed for nausea or vomiting. Every 4-6 hours as needed 08/30/14   [provider]  QUEtiapine (SEROQUEL) 50 MG tablet Take 1 tablet (50 mg total) by mouth at bedtime. 07/06/20   Nicole Kindred A, DO  rosuvastatin (CRESTOR) 20 MG tablet Take 20 mg by mouth at bedtime.    [provider]  Vitamin D, Ergocalciferol, (DRISDOL) 50000 UNITS CAPS Take 50,000 Units by mouth every 7 (seven) days.    [provider]    Family History    Family History  Problem Relation Age of Onset  .  Diabetes Father   . Heart attack Father   . Alcoholism Father   . Lung cancer Mother   . Liver cancer Mother   . Irritable bowel syndrome Sister   . Colon cancer Neg Hx   . Esophageal cancer Neg Hx   . Rectal cancer Neg Hx   . Stomach cancer Neg Hx    She indicated that her mother is deceased. She indicated that her father is deceased. She indicated that  her sister is alive. She indicated that the status of her neg hx is unknown.  Social History    Social History   Socioeconomic History  . Marital status: Married    Spouse name: Not on file  . Number of children: 1  . Years of education: Not on file  . Highest education level: Not on file  Occupational History  . Not on file  Tobacco Use  . Smoking status: Former Smoker    Packs/day: 0.50    Years: 25.00    Pack years: 12.50    Types: Cigarettes    Quit date: 06/20/2020    Years since quitting: 0.0  . Smokeless tobacco: Never Used  . Tobacco comment: 6-8 CIGS A DAY  Vaping Use  . Vaping Use: Never used  Substance and Sexual Activity  . Alcohol use: No    Alcohol/week: 0.0 standard drinks  . Drug use: No  . Sexual activity: Not on file  Other Topics Concern  . Not on file  Social History Narrative   Lives home with husband.  Education 12th grade.  Caffeine 4-5 drinks daily.   Social Determinants of Health   Financial Resource Strain: Not on file  Food Insecurity: Not on file  Transportation Needs: Not on file  Physical Activity: Not on file  Stress: Not on file  Social Connections: Not on file  Intimate Partner Violence: Not on file     Review of Systems    General:  No chills, fever, night sweats or weight changes.  Cardiovascular:  No chest pain, dyspnea on exertion, edema, orthopnea, palpitations, paroxysmal nocturnal dyspnea. Dermatological: No rash, lesions/masses Respiratory: No cough, dyspnea Urologic: No hematuria, dysuria Abdominal:   No nausea, vomiting, diarrhea, bright red blood per rectum,  melena, or hematemesis Neurologic:  No visual changes, wkns, changes in mental status. All other systems reviewed and are otherwise negative except as noted above.  Physical Exam    VS:  BP 126/66 (BP Location: Left Arm, Patient Position: Sitting)   Pulse 98   Ht 4\' 11"  (1.499 m)   Wt 147 lb 6.4 oz (66.9 kg)   SpO2 98%   BMI 29.77 kg/m  , BMI Body mass index is 29.77 kg/m. GEN: Well nourished, well developed, in no acute distress. HEENT: normal. Neck: Supple, no JVD, carotid bruits, or masses. Cardiac: RRR, no murmurs, rubs, or gallops. No clubbing, cyanosis, edema.  Radials/DP/PT 2+ and equal bilaterally.  Respiratory:  Respirations regular and unlabored, clear to auscultation bilaterally. GI: Soft, nontender, nondistended, BS + x 4. MS: no deformity or atrophy. Skin: warm and dry, no rash. Neuro:  Strength and sensation are intact. Psych: Normal affect.  Accessory Clinical Findings    Recent Labs: 06/21/2020: B Natriuretic Peptide 128.8 06/26/2020: ALT 18 07/04/2020: TSH 18.770 07/06/2020: BUN 16; Creatinine, Ser 0.66; Hemoglobin 9.6; Magnesium 1.7; Platelets 302; Potassium 3.6; Sodium 137   Recent Lipid Panel    Component Value Date/Time   CHOL 112 01/09/2019 1206   TRIG 204 (H) 06/27/2020 2137   HDL 40 01/09/2019 1206   CHOLHDL 2.8 01/09/2019 1206   LDLCALC 50 01/09/2019 1206    ECG personally reviewed by me today-none today.  Echocardiogram 06/24/2020 IMPRESSIONS    1. Left ventricular ejection fraction, by estimation, is 70 to 75%. The  left ventricle has hyperdynamic function. The left ventricle has no  regional wall motion abnormalities. Left ventricular diastolic parameters  were normal.  2. Right ventricular systolic function is normal. The right ventricular  size is normal. Tricuspid regurgitation signal is inadequate for assessing  PA pressure.  3. The mitral valve is normal in structure. No evidence of mitral valve  regurgitation. No evidence of  mitral stenosis.  4. The aortic valve is normal in structure. Aortic valve regurgitation is  not visualized. No aortic stenosis is present.  5. The inferior vena cava is normal in size with greater than 50%  respiratory variability, suggesting right atrial pressure of 3 mmHg.  Lower extremity ABIs 02/02/2020  Bilateral ABIs and TBIs appear essentially unchanged compared to prior  study on 01/15/2019.    Summary:  Right: Resting right ankle-brachial index is within normal range. No  evidence of significant right lower extremity arterial disease. The right  toe-brachial index is normal.   Left: Resting left ankle-brachial index is within normal range. No  evidence of significant left lower extremity arterial disease. The left  toe-brachial index is normal.      *See table(s) above for measurements and observations.    Suggest follow up study in 6 months per Dr. Gwenlyn Found.  Assessment & Plan   1.  Essential hypertension-BP today 126/66.  Well-controlled at home. Continue  metoprolol Heart healthy low-sodium diet-salty 6 given Increase physical activity as tolerated  Peripheral vascular disease-denies claudication.  Slowly increase in physical activity after January hospital admission.  She is status post bilateral iliac PTA and covered stenting 01/01/2019.  After intervention her claudication had resolved and Dopplers normalized.  8/21 Dopplers showed some progression of disease in left iliac however, ABIs remained stable. Continue Plavix, rosuvastatin Heart healthy low-sodium high-fiber diet Increase physical activity as tolerated Repeat lower extremity arterials and ABIs.  Hyperlipidemia-06/27/2020: Triglycerides 204 Continue Plavix, rosuvastatin Heart healthy low-sodium high-fiber diet increase physical activity as tolerated  Tobacco abuse- stopped smoking.   Congratulated on cessation   Disposition: Follow-up with Dr. Gwenlyn Found after  LEA and ABI's.  Jossie Ng. Tequila Rottmann  NP-C    07/25/2020, 3:52 PM [facility] [address] Office [phone] Fax [phone]  Notice: This dictation was prepared with Dragon dictation along with smaller phrase technology. Any transcriptional errors that result from this process are unintentional and may not be corrected upon review.  I spent 15 minutes examining this patient, reviewing medications, and using patient centered shared decision making involving her cardiac care.  Prior to her visit I spent greater than 20 minutes reviewing her past medical history,  medications, and prior cardiac tests.

## 2020-07-25 ENCOUNTER — Encounter: Payer: Self-pay | Admitting: General Practice

## 2020-07-25 ENCOUNTER — Ambulatory Visit: Payer: Medicare HMO | Admitting: General Practice

## 2020-07-25 ENCOUNTER — Other Ambulatory Visit: Payer: Self-pay

## 2020-07-25 VITALS — BP 126/66 | HR 98 | Ht 59.0 in | Wt 147.4 lb

## 2020-07-25 DIAGNOSIS — I10 Essential (primary) hypertension: Secondary | ICD-10-CM | POA: Diagnosis not present

## 2020-07-25 DIAGNOSIS — I739 Peripheral vascular disease, unspecified: Secondary | ICD-10-CM

## 2020-07-25 DIAGNOSIS — Z720 Tobacco use: Secondary | ICD-10-CM | POA: Diagnosis not present

## 2020-07-25 DIAGNOSIS — E782 Mixed hyperlipidemia: Secondary | ICD-10-CM

## 2020-07-25 NOTE — Patient Instructions (Signed)
Medication Instructions:  The current medical regimen is effective;  continue present plan and medications as directed. Please refer to the Current Medication list given to you today.  *If you need a refill on your cardiac medications before your next appointment, please call your pharmacy*  Lab Work:    NONE  Testing/Procedures:  Your physician has requested that you have an ankle brachial index (ABI) and LEA'S. During this test an ultrasound and blood pressure cuff are used to evaluate the arteries that supply the legs with blood. Allow thirty minutes for this exam. There are no restrictions or special instructions.  Special Instructions PLEASE PURCHASE AND WEAR COMPRESSION STOCKINGS DAILY AND TAKE OFF AT BEDTIME. Compression stockings are elastic socks that squeeze the legs. They help to increase blood flow to the legs and to decrease swelling in the legs from fluid retention, and reduce the chance of developing blood clots in the lower legs. Please put on in the AM when dressing and off at night when dressing for bed.  LET THEM KNOW THAT YOU NEED KNEE HIGH'S WITH COMPRESSION OF 15-20 mmhg. [employer]  [place] ([place] [phone]); [place], [place] [phone]; [phone]  EMAIL   [email or web address].  PLEASE MAKE SURE TO ELEVATE YOUR FEET & LEGS WHILE SITTING, THIS WILL HELP WITH THE SWELLING ALSO.   PLEASE READ AND FOLLOW SALTY 6-ATTACHED-1,800mg  daily  Follow-Up: Your next appointment:  AFTER ABI'S  In Person with Ashley Burow, MD ONLY  At [facility], you and your health needs are our priority.  As part of our continuing mission to provide you with exceptional heart care, we have created designated Provider Care Teams.  These Care Teams include your primary Cardiologist (physician) and Advanced Practice Providers (APPs -  Physician Assistants and Nurse Practitioners) who all work together to provide you with the care you need, when you need it.            6  SALTY THINGS TO AVOID     1,800MG  DAILY

## 2020-07-26 DIAGNOSIS — G9341 Metabolic encephalopathy: Secondary | ICD-10-CM | POA: Diagnosis not present

## 2020-07-26 DIAGNOSIS — U071 COVID-19: Secondary | ICD-10-CM | POA: Diagnosis not present

## 2020-07-26 DIAGNOSIS — I10 Essential (primary) hypertension: Secondary | ICD-10-CM | POA: Diagnosis not present

## 2020-07-26 DIAGNOSIS — J159 Unspecified bacterial pneumonia: Secondary | ICD-10-CM | POA: Diagnosis not present

## 2020-07-26 DIAGNOSIS — J4540 Moderate persistent asthma, uncomplicated: Secondary | ICD-10-CM | POA: Diagnosis not present

## 2020-07-26 DIAGNOSIS — L89312 Pressure ulcer of right buttock, stage 2: Secondary | ICD-10-CM | POA: Diagnosis not present

## 2020-07-26 DIAGNOSIS — E11610 Type 2 diabetes mellitus with diabetic neuropathic arthropathy: Secondary | ICD-10-CM | POA: Diagnosis not present

## 2020-07-26 DIAGNOSIS — E1151 Type 2 diabetes mellitus with diabetic peripheral angiopathy without gangrene: Secondary | ICD-10-CM | POA: Diagnosis not present

## 2020-07-26 DIAGNOSIS — J1282 Pneumonia due to coronavirus disease 2019: Secondary | ICD-10-CM | POA: Diagnosis not present

## 2020-07-27 DIAGNOSIS — J1282 Pneumonia due to coronavirus disease 2019: Secondary | ICD-10-CM | POA: Diagnosis not present

## 2020-07-27 DIAGNOSIS — J159 Unspecified bacterial pneumonia: Secondary | ICD-10-CM | POA: Diagnosis not present

## 2020-07-27 DIAGNOSIS — E1151 Type 2 diabetes mellitus with diabetic peripheral angiopathy without gangrene: Secondary | ICD-10-CM | POA: Diagnosis not present

## 2020-07-27 DIAGNOSIS — G9341 Metabolic encephalopathy: Secondary | ICD-10-CM | POA: Diagnosis not present

## 2020-07-27 DIAGNOSIS — I10 Essential (primary) hypertension: Secondary | ICD-10-CM | POA: Diagnosis not present

## 2020-07-27 DIAGNOSIS — U071 COVID-19: Secondary | ICD-10-CM | POA: Diagnosis not present

## 2020-07-27 DIAGNOSIS — E11610 Type 2 diabetes mellitus with diabetic neuropathic arthropathy: Secondary | ICD-10-CM | POA: Diagnosis not present

## 2020-07-27 DIAGNOSIS — J4540 Moderate persistent asthma, uncomplicated: Secondary | ICD-10-CM | POA: Diagnosis not present

## 2020-07-27 DIAGNOSIS — L89312 Pressure ulcer of right buttock, stage 2: Secondary | ICD-10-CM | POA: Diagnosis not present

## 2020-07-27 LAB — BLOOD GAS, ARTERIAL
Acid-Base Excess: 6.2 mmol/L — ABNORMAL HIGH (ref 0.0–2.0)
Bicarbonate: 31.3 mmol/L — ABNORMAL HIGH (ref 20.0–28.0)
FIO2: 1
MECHVT: 480 mL
O2 Saturation: 99.9 %
PIP: 5 cmH2O
Patient temperature: 37
RATE: 16 resp/min
pCO2 arterial: 45 mmHg (ref 32.0–48.0)
pH, Arterial: 7.45 (ref 7.350–7.450)
pO2, Arterial: 243 mmHg — ABNORMAL HIGH (ref 83.0–108.0)

## 2020-07-28 DIAGNOSIS — L89312 Pressure ulcer of right buttock, stage 2: Secondary | ICD-10-CM | POA: Diagnosis not present

## 2020-07-28 DIAGNOSIS — G9341 Metabolic encephalopathy: Secondary | ICD-10-CM | POA: Diagnosis not present

## 2020-07-28 DIAGNOSIS — J159 Unspecified bacterial pneumonia: Secondary | ICD-10-CM | POA: Diagnosis not present

## 2020-07-28 DIAGNOSIS — U071 COVID-19: Secondary | ICD-10-CM | POA: Diagnosis not present

## 2020-07-28 DIAGNOSIS — E11610 Type 2 diabetes mellitus with diabetic neuropathic arthropathy: Secondary | ICD-10-CM | POA: Diagnosis not present

## 2020-07-28 DIAGNOSIS — J1282 Pneumonia due to coronavirus disease 2019: Secondary | ICD-10-CM | POA: Diagnosis not present

## 2020-07-28 DIAGNOSIS — I10 Essential (primary) hypertension: Secondary | ICD-10-CM | POA: Diagnosis not present

## 2020-07-28 DIAGNOSIS — E1151 Type 2 diabetes mellitus with diabetic peripheral angiopathy without gangrene: Secondary | ICD-10-CM | POA: Diagnosis not present

## 2020-07-28 DIAGNOSIS — J4540 Moderate persistent asthma, uncomplicated: Secondary | ICD-10-CM | POA: Diagnosis not present

## 2020-07-29 DIAGNOSIS — U071 COVID-19: Secondary | ICD-10-CM | POA: Diagnosis not present

## 2020-07-29 DIAGNOSIS — J1282 Pneumonia due to coronavirus disease 2019: Secondary | ICD-10-CM | POA: Diagnosis not present

## 2020-07-29 DIAGNOSIS — G9341 Metabolic encephalopathy: Secondary | ICD-10-CM | POA: Diagnosis not present

## 2020-07-29 DIAGNOSIS — E1151 Type 2 diabetes mellitus with diabetic peripheral angiopathy without gangrene: Secondary | ICD-10-CM | POA: Diagnosis not present

## 2020-07-29 DIAGNOSIS — E11610 Type 2 diabetes mellitus with diabetic neuropathic arthropathy: Secondary | ICD-10-CM | POA: Diagnosis not present

## 2020-07-29 DIAGNOSIS — L89312 Pressure ulcer of right buttock, stage 2: Secondary | ICD-10-CM | POA: Diagnosis not present

## 2020-07-29 DIAGNOSIS — J4540 Moderate persistent asthma, uncomplicated: Secondary | ICD-10-CM | POA: Diagnosis not present

## 2020-07-29 DIAGNOSIS — I10 Essential (primary) hypertension: Secondary | ICD-10-CM | POA: Diagnosis not present

## 2020-07-29 DIAGNOSIS — J159 Unspecified bacterial pneumonia: Secondary | ICD-10-CM | POA: Diagnosis not present

## 2020-08-02 DIAGNOSIS — L89312 Pressure ulcer of right buttock, stage 2: Secondary | ICD-10-CM | POA: Diagnosis not present

## 2020-08-02 DIAGNOSIS — J4540 Moderate persistent asthma, uncomplicated: Secondary | ICD-10-CM | POA: Diagnosis not present

## 2020-08-02 DIAGNOSIS — E11610 Type 2 diabetes mellitus with diabetic neuropathic arthropathy: Secondary | ICD-10-CM | POA: Diagnosis not present

## 2020-08-02 DIAGNOSIS — I10 Essential (primary) hypertension: Secondary | ICD-10-CM | POA: Diagnosis not present

## 2020-08-02 DIAGNOSIS — U071 COVID-19: Secondary | ICD-10-CM | POA: Diagnosis not present

## 2020-08-02 DIAGNOSIS — J159 Unspecified bacterial pneumonia: Secondary | ICD-10-CM | POA: Diagnosis not present

## 2020-08-02 DIAGNOSIS — G9341 Metabolic encephalopathy: Secondary | ICD-10-CM | POA: Diagnosis not present

## 2020-08-02 DIAGNOSIS — J1282 Pneumonia due to coronavirus disease 2019: Secondary | ICD-10-CM | POA: Diagnosis not present

## 2020-08-02 DIAGNOSIS — E1151 Type 2 diabetes mellitus with diabetic peripheral angiopathy without gangrene: Secondary | ICD-10-CM | POA: Diagnosis not present

## 2020-08-04 DIAGNOSIS — E11610 Type 2 diabetes mellitus with diabetic neuropathic arthropathy: Secondary | ICD-10-CM | POA: Diagnosis not present

## 2020-08-04 DIAGNOSIS — G9341 Metabolic encephalopathy: Secondary | ICD-10-CM | POA: Diagnosis not present

## 2020-08-04 DIAGNOSIS — J1282 Pneumonia due to coronavirus disease 2019: Secondary | ICD-10-CM | POA: Diagnosis not present

## 2020-08-04 DIAGNOSIS — E1151 Type 2 diabetes mellitus with diabetic peripheral angiopathy without gangrene: Secondary | ICD-10-CM | POA: Diagnosis not present

## 2020-08-04 DIAGNOSIS — J4540 Moderate persistent asthma, uncomplicated: Secondary | ICD-10-CM | POA: Diagnosis not present

## 2020-08-04 DIAGNOSIS — I10 Essential (primary) hypertension: Secondary | ICD-10-CM | POA: Diagnosis not present

## 2020-08-04 DIAGNOSIS — J159 Unspecified bacterial pneumonia: Secondary | ICD-10-CM | POA: Diagnosis not present

## 2020-08-04 DIAGNOSIS — U071 COVID-19: Secondary | ICD-10-CM | POA: Diagnosis not present

## 2020-08-04 DIAGNOSIS — L89312 Pressure ulcer of right buttock, stage 2: Secondary | ICD-10-CM | POA: Diagnosis not present

## 2020-08-06 DIAGNOSIS — U071 COVID-19: Secondary | ICD-10-CM | POA: Diagnosis not present

## 2020-08-06 DIAGNOSIS — G9341 Metabolic encephalopathy: Secondary | ICD-10-CM | POA: Diagnosis not present

## 2020-08-06 DIAGNOSIS — I10 Essential (primary) hypertension: Secondary | ICD-10-CM | POA: Diagnosis not present

## 2020-08-06 DIAGNOSIS — J4540 Moderate persistent asthma, uncomplicated: Secondary | ICD-10-CM | POA: Diagnosis not present

## 2020-08-06 DIAGNOSIS — J159 Unspecified bacterial pneumonia: Secondary | ICD-10-CM | POA: Diagnosis not present

## 2020-08-06 DIAGNOSIS — E1151 Type 2 diabetes mellitus with diabetic peripheral angiopathy without gangrene: Secondary | ICD-10-CM | POA: Diagnosis not present

## 2020-08-06 DIAGNOSIS — J1282 Pneumonia due to coronavirus disease 2019: Secondary | ICD-10-CM | POA: Diagnosis not present

## 2020-08-06 DIAGNOSIS — L89312 Pressure ulcer of right buttock, stage 2: Secondary | ICD-10-CM | POA: Diagnosis not present

## 2020-08-06 DIAGNOSIS — E11610 Type 2 diabetes mellitus with diabetic neuropathic arthropathy: Secondary | ICD-10-CM | POA: Diagnosis not present

## 2020-08-09 DIAGNOSIS — E1151 Type 2 diabetes mellitus with diabetic peripheral angiopathy without gangrene: Secondary | ICD-10-CM | POA: Diagnosis not present

## 2020-08-09 DIAGNOSIS — E11610 Type 2 diabetes mellitus with diabetic neuropathic arthropathy: Secondary | ICD-10-CM | POA: Diagnosis not present

## 2020-08-09 DIAGNOSIS — J4540 Moderate persistent asthma, uncomplicated: Secondary | ICD-10-CM | POA: Diagnosis not present

## 2020-08-09 DIAGNOSIS — I10 Essential (primary) hypertension: Secondary | ICD-10-CM | POA: Diagnosis not present

## 2020-08-09 DIAGNOSIS — L89312 Pressure ulcer of right buttock, stage 2: Secondary | ICD-10-CM | POA: Diagnosis not present

## 2020-08-09 DIAGNOSIS — U071 COVID-19: Secondary | ICD-10-CM | POA: Diagnosis not present

## 2020-08-09 DIAGNOSIS — J1282 Pneumonia due to coronavirus disease 2019: Secondary | ICD-10-CM | POA: Diagnosis not present

## 2020-08-09 DIAGNOSIS — J159 Unspecified bacterial pneumonia: Secondary | ICD-10-CM | POA: Diagnosis not present

## 2020-08-09 DIAGNOSIS — G9341 Metabolic encephalopathy: Secondary | ICD-10-CM | POA: Diagnosis not present

## 2020-08-12 DIAGNOSIS — E11610 Type 2 diabetes mellitus with diabetic neuropathic arthropathy: Secondary | ICD-10-CM | POA: Diagnosis not present

## 2020-08-12 DIAGNOSIS — J159 Unspecified bacterial pneumonia: Secondary | ICD-10-CM | POA: Diagnosis not present

## 2020-08-12 DIAGNOSIS — J4540 Moderate persistent asthma, uncomplicated: Secondary | ICD-10-CM | POA: Diagnosis not present

## 2020-08-12 DIAGNOSIS — G9341 Metabolic encephalopathy: Secondary | ICD-10-CM | POA: Diagnosis not present

## 2020-08-12 DIAGNOSIS — U071 COVID-19: Secondary | ICD-10-CM | POA: Diagnosis not present

## 2020-08-12 DIAGNOSIS — J1282 Pneumonia due to coronavirus disease 2019: Secondary | ICD-10-CM | POA: Diagnosis not present

## 2020-08-12 DIAGNOSIS — E1151 Type 2 diabetes mellitus with diabetic peripheral angiopathy without gangrene: Secondary | ICD-10-CM | POA: Diagnosis not present

## 2020-08-12 DIAGNOSIS — I10 Essential (primary) hypertension: Secondary | ICD-10-CM | POA: Diagnosis not present

## 2020-08-12 DIAGNOSIS — L89312 Pressure ulcer of right buttock, stage 2: Secondary | ICD-10-CM | POA: Diagnosis not present

## 2020-08-15 DIAGNOSIS — E11610 Type 2 diabetes mellitus with diabetic neuropathic arthropathy: Secondary | ICD-10-CM | POA: Diagnosis not present

## 2020-08-15 DIAGNOSIS — L89312 Pressure ulcer of right buttock, stage 2: Secondary | ICD-10-CM | POA: Diagnosis not present

## 2020-08-15 DIAGNOSIS — J1282 Pneumonia due to coronavirus disease 2019: Secondary | ICD-10-CM | POA: Diagnosis not present

## 2020-08-15 DIAGNOSIS — J159 Unspecified bacterial pneumonia: Secondary | ICD-10-CM | POA: Diagnosis not present

## 2020-08-15 DIAGNOSIS — G9341 Metabolic encephalopathy: Secondary | ICD-10-CM | POA: Diagnosis not present

## 2020-08-15 DIAGNOSIS — E1151 Type 2 diabetes mellitus with diabetic peripheral angiopathy without gangrene: Secondary | ICD-10-CM | POA: Diagnosis not present

## 2020-08-15 DIAGNOSIS — I10 Essential (primary) hypertension: Secondary | ICD-10-CM | POA: Diagnosis not present

## 2020-08-15 DIAGNOSIS — E109 Type 1 diabetes mellitus without complications: Secondary | ICD-10-CM | POA: Diagnosis not present

## 2020-08-15 DIAGNOSIS — U071 COVID-19: Secondary | ICD-10-CM | POA: Diagnosis not present

## 2020-08-15 DIAGNOSIS — J4540 Moderate persistent asthma, uncomplicated: Secondary | ICD-10-CM | POA: Diagnosis not present

## 2020-08-17 DIAGNOSIS — I10 Essential (primary) hypertension: Secondary | ICD-10-CM | POA: Diagnosis not present

## 2020-08-17 DIAGNOSIS — E785 Hyperlipidemia, unspecified: Secondary | ICD-10-CM | POA: Diagnosis not present

## 2020-08-17 DIAGNOSIS — G9341 Metabolic encephalopathy: Secondary | ICD-10-CM | POA: Diagnosis not present

## 2020-08-17 DIAGNOSIS — J159 Unspecified bacterial pneumonia: Secondary | ICD-10-CM | POA: Diagnosis not present

## 2020-08-17 DIAGNOSIS — Z4681 Encounter for fitting and adjustment of insulin pump: Secondary | ICD-10-CM | POA: Diagnosis not present

## 2020-08-17 DIAGNOSIS — E1040 Type 1 diabetes mellitus with diabetic neuropathy, unspecified: Secondary | ICD-10-CM | POA: Diagnosis not present

## 2020-08-17 DIAGNOSIS — E11610 Type 2 diabetes mellitus with diabetic neuropathic arthropathy: Secondary | ICD-10-CM | POA: Diagnosis not present

## 2020-08-17 DIAGNOSIS — J4540 Moderate persistent asthma, uncomplicated: Secondary | ICD-10-CM | POA: Diagnosis not present

## 2020-08-17 DIAGNOSIS — J1282 Pneumonia due to coronavirus disease 2019: Secondary | ICD-10-CM | POA: Diagnosis not present

## 2020-08-17 DIAGNOSIS — L89312 Pressure ulcer of right buttock, stage 2: Secondary | ICD-10-CM | POA: Diagnosis not present

## 2020-08-17 DIAGNOSIS — Z794 Long term (current) use of insulin: Secondary | ICD-10-CM | POA: Diagnosis not present

## 2020-08-17 DIAGNOSIS — E1151 Type 2 diabetes mellitus with diabetic peripheral angiopathy without gangrene: Secondary | ICD-10-CM | POA: Diagnosis not present

## 2020-08-17 DIAGNOSIS — U071 COVID-19: Secondary | ICD-10-CM | POA: Diagnosis not present

## 2020-08-22 DIAGNOSIS — G9341 Metabolic encephalopathy: Secondary | ICD-10-CM | POA: Diagnosis not present

## 2020-08-22 DIAGNOSIS — I10 Essential (primary) hypertension: Secondary | ICD-10-CM | POA: Diagnosis not present

## 2020-08-22 DIAGNOSIS — J4540 Moderate persistent asthma, uncomplicated: Secondary | ICD-10-CM | POA: Diagnosis not present

## 2020-08-22 DIAGNOSIS — E11610 Type 2 diabetes mellitus with diabetic neuropathic arthropathy: Secondary | ICD-10-CM | POA: Diagnosis not present

## 2020-08-22 DIAGNOSIS — U071 COVID-19: Secondary | ICD-10-CM | POA: Diagnosis not present

## 2020-08-22 DIAGNOSIS — J159 Unspecified bacterial pneumonia: Secondary | ICD-10-CM | POA: Diagnosis not present

## 2020-08-22 DIAGNOSIS — J1282 Pneumonia due to coronavirus disease 2019: Secondary | ICD-10-CM | POA: Diagnosis not present

## 2020-08-22 DIAGNOSIS — L89312 Pressure ulcer of right buttock, stage 2: Secondary | ICD-10-CM | POA: Diagnosis not present

## 2020-08-22 DIAGNOSIS — E1151 Type 2 diabetes mellitus with diabetic peripheral angiopathy without gangrene: Secondary | ICD-10-CM | POA: Diagnosis not present

## 2020-08-27 ENCOUNTER — Other Ambulatory Visit: Payer: Self-pay | Admitting: Cardiovascular Disease

## 2020-08-31 DIAGNOSIS — E11610 Type 2 diabetes mellitus with diabetic neuropathic arthropathy: Secondary | ICD-10-CM | POA: Diagnosis not present

## 2020-08-31 DIAGNOSIS — L89312 Pressure ulcer of right buttock, stage 2: Secondary | ICD-10-CM | POA: Diagnosis not present

## 2020-08-31 DIAGNOSIS — J4540 Moderate persistent asthma, uncomplicated: Secondary | ICD-10-CM | POA: Diagnosis not present

## 2020-08-31 DIAGNOSIS — E1151 Type 2 diabetes mellitus with diabetic peripheral angiopathy without gangrene: Secondary | ICD-10-CM | POA: Diagnosis not present

## 2020-08-31 DIAGNOSIS — J159 Unspecified bacterial pneumonia: Secondary | ICD-10-CM | POA: Diagnosis not present

## 2020-08-31 DIAGNOSIS — G9341 Metabolic encephalopathy: Secondary | ICD-10-CM | POA: Diagnosis not present

## 2020-08-31 DIAGNOSIS — I10 Essential (primary) hypertension: Secondary | ICD-10-CM | POA: Diagnosis not present

## 2020-08-31 DIAGNOSIS — J1282 Pneumonia due to coronavirus disease 2019: Secondary | ICD-10-CM | POA: Diagnosis not present

## 2020-08-31 DIAGNOSIS — U071 COVID-19: Secondary | ICD-10-CM | POA: Diagnosis not present

## 2020-10-05 DIAGNOSIS — E785 Hyperlipidemia, unspecified: Secondary | ICD-10-CM | POA: Diagnosis not present

## 2020-10-05 DIAGNOSIS — M810 Age-related osteoporosis without current pathological fracture: Secondary | ICD-10-CM | POA: Diagnosis not present

## 2020-10-05 DIAGNOSIS — E10649 Type 1 diabetes mellitus with hypoglycemia without coma: Secondary | ICD-10-CM | POA: Diagnosis not present

## 2020-10-11 DIAGNOSIS — J45909 Unspecified asthma, uncomplicated: Secondary | ICD-10-CM | POA: Diagnosis not present

## 2020-10-11 DIAGNOSIS — G9341 Metabolic encephalopathy: Secondary | ICD-10-CM | POA: Diagnosis not present

## 2020-10-11 DIAGNOSIS — Z1331 Encounter for screening for depression: Secondary | ICD-10-CM | POA: Diagnosis not present

## 2020-10-11 DIAGNOSIS — M810 Age-related osteoporosis without current pathological fracture: Secondary | ICD-10-CM | POA: Diagnosis not present

## 2020-10-11 DIAGNOSIS — E10621 Type 1 diabetes mellitus with foot ulcer: Secondary | ICD-10-CM | POA: Diagnosis not present

## 2020-10-11 DIAGNOSIS — I739 Peripheral vascular disease, unspecified: Secondary | ICD-10-CM | POA: Diagnosis not present

## 2020-10-11 DIAGNOSIS — Z0000 Encounter for general adult medical examination without abnormal findings: Secondary | ICD-10-CM | POA: Diagnosis not present

## 2020-10-11 DIAGNOSIS — I10 Essential (primary) hypertension: Secondary | ICD-10-CM | POA: Diagnosis not present

## 2020-10-11 DIAGNOSIS — Z1389 Encounter for screening for other disorder: Secondary | ICD-10-CM | POA: Diagnosis not present

## 2020-10-11 DIAGNOSIS — F418 Other specified anxiety disorders: Secondary | ICD-10-CM | POA: Diagnosis not present

## 2020-10-11 DIAGNOSIS — E1040 Type 1 diabetes mellitus with diabetic neuropathy, unspecified: Secondary | ICD-10-CM | POA: Diagnosis not present

## 2020-10-11 DIAGNOSIS — R82998 Other abnormal findings in urine: Secondary | ICD-10-CM | POA: Diagnosis not present

## 2020-10-11 DIAGNOSIS — E785 Hyperlipidemia, unspecified: Secondary | ICD-10-CM | POA: Diagnosis not present

## 2020-10-11 DIAGNOSIS — Z1212 Encounter for screening for malignant neoplasm of rectum: Secondary | ICD-10-CM | POA: Diagnosis not present

## 2020-10-24 DIAGNOSIS — Z79899 Other long term (current) drug therapy: Secondary | ICD-10-CM | POA: Diagnosis not present

## 2020-10-24 DIAGNOSIS — H30033 Focal chorioretinal inflammation, peripheral, bilateral: Secondary | ICD-10-CM | POA: Diagnosis not present

## 2020-10-28 DIAGNOSIS — H3581 Retinal edema: Secondary | ICD-10-CM | POA: Diagnosis not present

## 2020-10-28 DIAGNOSIS — E109 Type 1 diabetes mellitus without complications: Secondary | ICD-10-CM | POA: Diagnosis not present

## 2020-10-28 DIAGNOSIS — Z961 Presence of intraocular lens: Secondary | ICD-10-CM | POA: Diagnosis not present

## 2020-10-28 DIAGNOSIS — H30033 Focal chorioretinal inflammation, peripheral, bilateral: Secondary | ICD-10-CM | POA: Diagnosis not present

## 2020-10-28 DIAGNOSIS — Z79899 Other long term (current) drug therapy: Secondary | ICD-10-CM | POA: Diagnosis not present

## 2020-11-14 ENCOUNTER — Emergency Department
Admission: EM | Admit: 2020-11-14 | Discharge: 2020-11-14 | Disposition: A | Discharge status: Home / Self Care | Payer: Medicare HMO | Attending: Emergency Medicine | Admitting: Emergency Medicine

## 2020-11-14 ENCOUNTER — Emergency Department: Payer: Medicare HMO

## 2020-11-14 ENCOUNTER — Encounter: Payer: Self-pay | Admitting: Radiology

## 2020-11-14 ENCOUNTER — Other Ambulatory Visit: Payer: Self-pay

## 2020-11-14 DIAGNOSIS — Z794 Long term (current) use of insulin: Secondary | ICD-10-CM | POA: Diagnosis not present

## 2020-11-14 DIAGNOSIS — M25531 Pain in right wrist: Secondary | ICD-10-CM

## 2020-11-14 DIAGNOSIS — S52591A Other fractures of lower end of right radius, initial encounter for closed fracture: Secondary | ICD-10-CM | POA: Diagnosis not present

## 2020-11-14 DIAGNOSIS — E1140 Type 2 diabetes mellitus with diabetic neuropathy, unspecified: Secondary | ICD-10-CM | POA: Diagnosis not present

## 2020-11-14 DIAGNOSIS — E039 Hypothyroidism, unspecified: Secondary | ICD-10-CM | POA: Diagnosis not present

## 2020-11-14 DIAGNOSIS — Z7902 Long term (current) use of antithrombotics/antiplatelets: Secondary | ICD-10-CM | POA: Insufficient documentation

## 2020-11-14 DIAGNOSIS — E1110 Type 2 diabetes mellitus with ketoacidosis without coma: Secondary | ICD-10-CM | POA: Diagnosis not present

## 2020-11-14 DIAGNOSIS — J4540 Moderate persistent asthma, uncomplicated: Secondary | ICD-10-CM | POA: Insufficient documentation

## 2020-11-14 DIAGNOSIS — I10 Essential (primary) hypertension: Secondary | ICD-10-CM | POA: Insufficient documentation

## 2020-11-14 DIAGNOSIS — S52501A Unspecified fracture of the lower end of right radius, initial encounter for closed fracture: Secondary | ICD-10-CM | POA: Diagnosis not present

## 2020-11-14 DIAGNOSIS — Z8616 Personal history of COVID-19: Secondary | ICD-10-CM | POA: Diagnosis not present

## 2020-11-14 DIAGNOSIS — Z79899 Other long term (current) drug therapy: Secondary | ICD-10-CM | POA: Insufficient documentation

## 2020-11-14 DIAGNOSIS — W010XXA Fall on same level from slipping, tripping and stumbling without subsequent striking against object, initial encounter: Secondary | ICD-10-CM | POA: Diagnosis not present

## 2020-11-14 DIAGNOSIS — Z87891 Personal history of nicotine dependence: Secondary | ICD-10-CM | POA: Insufficient documentation

## 2020-11-14 DIAGNOSIS — S4991XA Unspecified injury of right shoulder and upper arm, initial encounter: Secondary | ICD-10-CM | POA: Diagnosis present

## 2020-11-14 DIAGNOSIS — S52601A Unspecified fracture of lower end of right ulna, initial encounter for closed fracture: Secondary | ICD-10-CM | POA: Diagnosis not present

## 2020-11-14 MED ORDER — ONDANSETRON HCL 4 MG/2ML IJ SOLN
4.0000 mg | Freq: Once | INTRAMUSCULAR | Status: AC
  Administered 2020-11-14: 4 mg via INTRAVENOUS
  Filled 2020-11-14: qty 2

## 2020-11-14 MED ORDER — PROPOFOL 10 MG/ML IV BOLUS
INTRAVENOUS | Status: AC
  Administered 2020-11-14: 200 mg via INTRAVENOUS
  Filled 2020-11-14: qty 20

## 2020-11-14 MED ORDER — HYDROMORPHONE HCL 1 MG/ML IJ SOLN
1.0000 mg | Freq: Once | INTRAMUSCULAR | Status: AC
  Administered 2020-11-14: 1 mg via INTRAVENOUS
  Filled 2020-11-14: qty 1

## 2020-11-14 MED ORDER — ONDANSETRON 4 MG PO TBDP: 4.0000 mg | ORAL_TABLET | Freq: Four times a day (QID) | ORAL | 0 refills | Status: DC | PRN

## 2020-11-14 MED ORDER — PROPOFOL 10 MG/ML IV BOLUS: 1.0000 mg/kg | Freq: Once | INTRAVENOUS | Status: AC

## 2020-11-14 MED ORDER — OXYCODONE-ACETAMINOPHEN 5-325 MG PO TABS
2.0000 | ORAL_TABLET | Freq: Once | ORAL | Status: AC
  Administered 2020-11-14: 2 via ORAL
  Filled 2020-11-14: qty 2

## 2020-11-14 MED ORDER — SODIUM CHLORIDE 0.9 % IV SOLN: INTRAVENOUS | Status: DC

## 2020-11-14 MED ORDER — OXYCODONE-ACETAMINOPHEN 5-325 MG PO TABS: 2.0000 | ORAL_TABLET | Freq: Four times a day (QID) | ORAL | 0 refills | Status: DC | PRN

## 2020-11-14 NOTE — ED Triage Notes (Signed)
Pt with right wrist deformity after fall over her dog. tp denies head injury. Pt is able to move fingers.

## 2020-11-14 NOTE — ED Notes (Signed)
X-ray at bedside

## 2020-11-14 NOTE — ED Notes (Signed)
CBG 201 

## 2020-11-14 NOTE — Discharge Instructions (Addendum)

## 2020-11-14 NOTE — ED Provider Notes (Signed)
[facility] Emergency Department Provider Note ____________________________________________   Event Date/Time   First MD Initiated Contact with Patient 11/14/20 0402     (approximate)  I have reviewed the triage vital signs and the nursing notes.   HISTORY  Chief Complaint Wrist Injury    HPI Ashley [name] is a 55 y.o. female with history of insulin-dependent diabetes who presents to the emergency department with a right wrist injury that occurred tonight after she tripped and fell over her dog.  Denies hitting her head or losing consciousness.  No neck or back pain.  No preceding symptoms that led to her fall.  She is left-hand dominant.  No other injuries.  Last ate around midnight.         Past Medical History:  Diagnosis Date  . Anxiety   . Asthma   . Depression   . Diabetes mellitus ([place])   . Esophageal stricture   . Gastroparesis   . GERD (gastroesophageal reflux disease)   . Glaucoma   . Graves disease 02/2010   diagnosed by radioactive iodine uptake and scan   . Helicobacter pylori infection   . Hyperlipidemia   . Hypertension   . Hypothyroidism   . Insulin pump in place   . Nephrolithiasis 2010   rt sided per GI  . Neuropathy    diabetic neuropathy  . Osteoporosis   . Sleep apnea   . Sleep apnea with use of continuous positive airway pressure (CPAP)   . Vitamin D deficiency     Patient Active Problem List   Diagnosis Date Noted  . Endotracheal tube present   . DKA (diabetic ketoacidosis) ([place]) 06/21/2020  . Rheumatoid arthritis ([place]) 06/21/2020  . Pneumonia due to COVID-19 virus 06/21/2020  . Tobacco abuse 02/24/2020  . Claudication in peripheral vascular disease ([place]) 01/01/2019  . Essential hypertension 12/25/2018  . Hyperlipidemia 12/25/2018  . Peripheral arterial disease ([place]) 12/25/2018  . Hypothyroidism 12/09/2018  . Hypertensive disorder 12/09/2018  . OSA on CPAP 10/08/2018  . Loud snoring 05/19/2018  .  OSA and COPD overlap syndrome ([place]) 04/02/2018  . Excessive daytime sleepiness 04/02/2018  . Delayed sleep phase syndrome 04/02/2018  . Moderate persistent asthma 04/02/2018  . Dyspnea and respiratory abnormality 04/26/2015  . Chest tightness 04/26/2015  . Hoarseness of voice 04/26/2015  . Nausea with vomiting 10/22/2012  . Diarrhea 10/22/2012  . Gastroparesis 03/22/2009  . DYSPHAGIA 03/22/2009  . DIAB W/O MENTION COMP TYPE I [JUV TYPE] UNCNTRL 02/28/2009  . GERD 02/28/2009  . DYSPHAGIA UNSPECIFIED 02/28/2009  . ABDOMINAL PAIN-EPIGASTRIC 02/28/2009    Past Surgical History:  Procedure Laterality Date  . ABDOMINAL AORTOGRAM W/LOWER EXTREMITY Bilateral 01/01/2019   Procedure: ABDOMINAL AORTOGRAM W/LOWER EXTREMITY;  Surgeon: Lorretta Harp, MD;  Location: [facility];  Service: Cardiovascular;  Laterality: Bilateral;  . BREAST EXCISIONAL BIOPSY    . BREAST LUMPECTOMY Left    LEFT-BENIGN  . NASAL SEPTOPLASTY W/ TURBINOPLASTY Bilateral 09/07/2013   Procedure: SEPTOPLASTY, BILATERAL TURBINATE RESECTION ;  Surgeon: Ascencion Dike, MD;  Location: [place];  Service: ENT;  Laterality: Bilateral;  . PERIPHERAL VASCULAR INTERVENTION Bilateral 01/01/2019   Procedure: PERIPHERAL VASCULAR INTERVENTION;  Surgeon: Lorretta Harp, MD;  Location: [facility];  Service: Cardiovascular;  Laterality: Bilateral;  . UPPER GASTROINTESTINAL ENDOSCOPY    . VAGINAL HYSTERECTOMY  2006    Prior to Admission medications   Medication Sig Start Date End Date Taking? Authorizing Provider  ondansetron (  ZOFRAN ODT) 4 MG disintegrating tablet Take 1 tablet (4 mg total) by mouth every 6 (six) hours as needed for nausea or vomiting. 11/14/20  Yes Romir Klimowicz, Delice Bison, DO  oxyCODONE-acetaminophen (PERCOCET) 5-325 MG tablet Take 2 tablets by mouth every 6 (six) hours as needed for severe pain. 11/14/20  Yes Nehemias Sauceda, Delice Bison, DO  ACCU-CHEK COMPACT PLUS test strip 1 each by Other route as needed.   10/20/12   [provider]  ACCU-CHEK SOFTCLIX LANCETS lancets  05/19/18   [provider]  albuterol (PROVENTIL) (2.5 MG/3ML) 0.083% nebulizer solution Take 2.5 mg by nebulization every 4 (four) hours as needed for wheezing or shortness of breath.  03/18/15   [provider]  ALPRAZolam Duanne Moron) 0.5 MG tablet Take 0.5 mg by mouth 3 (three) times daily as needed for anxiety.  10/08/12   [provider]  AMBULATORY NON [place] INSULIN PUMP USES AS DIRECTED    [provider]  Blood Glucose Monitoring Suppl (ACCU-CHEK AVIVA PLUS) w/Device KIT  06/08/20   [provider]  busPIRone (BUSPAR) 15 MG tablet Take 15 mg by mouth 2 (two) times daily. 05/25/20   [provider]  clopidogrel (PLAVIX) 75 MG tablet TAKE 1 TABLET EVERY DAY WITH BREAKFAST 08/29/20   Lorretta Harp, MD  DULoxetine (CYMBALTA) 30 MG capsule Take 30 mg by mouth every morning. Takes 90 mg total 01/22/18   [provider]  DULoxetine (CYMBALTA) 60 MG capsule Take 60 mg by mouth every morning. Takes 90 mg total    [provider]  estradiol (ESTRACE) 0.5 MG tablet Take 0.5 mg by mouth daily. 04/07/20   [provider]  famotidine (PEPCID) 20 MG tablet Take 2 tablets (40 mg total) by mouth 2 (two) times daily. 07/06/20   Ezekiel Slocumb, DO  folic acid (FOLVITE) 1 MG tablet Take 1 tablet by mouth daily. 06/23/19   [provider]  ibandronate (BONIVA) 150 MG tablet Take 150 mg by mouth every 30 (thirty) days. 03/23/20   [provider]  Insulin Human (INSULIN PUMP) SOLN Inject into the skin.    [provider]  lansoprazole (PREVACID) 30 MG capsule Take 1 capsule (30 mg total) by mouth in the morning and at bedtime. 12/04/19   Irene Shipper, MD  latanoprost (XALATAN) 0.005 % ophthalmic solution Place 1 drop into both eyes at bedtime. 12/26/17   [provider]  levocetirizine (XYZAL) 5 MG tablet Take  5 mg by mouth every morning.     [provider]  levothyroxine (SYNTHROID, LEVOTHROID) 112 MCG tablet Take 112 mcg by mouth daily before breakfast.  01/07/18   [provider]  methotrexate (RHEUMATREX) 2.5 MG tablet 5 tablets by moth twice a day once a week 06/23/19   [provider]  montelukast (SINGULAIR) 10 MG tablet Take 10 mg by mouth at bedtime.    [provider]  NOVOLOG 100 UNIT/ML injection Inject into the skin See admin instructions. Pt has Insulin pump 08/06/18   [provider]  Nutritional Supplements (FEEDING SUPPLEMENT, NEPRO CARB STEADY,) LIQD Take 237 mLs by mouth 3 (three) times daily between meals. Patient not taking: Reported on 07/25/2020 07/06/20   Nicole Kindred A, DO  pregabalin (LYRICA) 75 MG capsule Take 150 mg by mouth at bedtime.  07/24/18   [provider]  promethazine (PHENERGAN) 25 MG tablet Take 25 mg by mouth every 4 (four) hours as needed for nausea or vomiting. Every 4-6 hours  as needed 08/30/14   [provider]  QUEtiapine (SEROQUEL) 50 MG tablet Take 1 tablet (50 mg total) by mouth at bedtime. 07/06/20   Nicole Kindred A, DO  rosuvastatin (CRESTOR) 20 MG tablet Take 20 mg by mouth at bedtime.    [provider]  Vitamin D, Ergocalciferol, (DRISDOL) 50000 UNITS CAPS Take 50,000 Units by mouth every 7 (seven) days.    [provider]    Allergies Mirtazapine, Clindamycin, Other, Sulfonamide derivatives, Lipitor [atorvastatin], Restasis [cyclosporine], and Topamax [topiramate]  Family History  Problem Relation Age of Onset  . Diabetes Father   . Heart attack Father   . Alcoholism Father   . Lung cancer Mother   . Liver cancer Mother   . Irritable bowel syndrome Sister   . Colon cancer Neg Hx   . Esophageal cancer Neg Hx   . Rectal cancer Neg Hx   . Stomach cancer Neg Hx     Social History Social History   Tobacco Use  . Smoking status: Former Smoker    Packs/day: 0.50     Years: 25.00    Pack years: 12.50    Types: Cigarettes    Quit date: 06/20/2020    Years since quitting: 0.4  . Smokeless tobacco: Never Used  . Tobacco comment: 6-8 CIGS A DAY  Vaping Use  . Vaping Use: Never used  Substance Use Topics  . Alcohol use: No    Alcohol/week: 0.0 standard drinks  . Drug use: No    Review of Systems Constitutional: No fever. Eyes: No visual changes. ENT: No sore throat. Cardiovascular: Denies chest pain. Respiratory: Denies shortness of breath. Gastrointestinal: No nausea, vomiting, diarrhea. Genitourinary: Negative for dysuria. Musculoskeletal: Negative for back pain. Skin: Negative for rash. Neurological: Negative for focal weakness or numbness.   ____________________________________________   PHYSICAL EXAM:  VITAL SIGNS: ED Triage Vitals  Enc Vitals Group     BP 11/14/20 0051 (!) 171/72     Pulse Rate 11/14/20 0051 100     Resp 11/14/20 0051 20     Temp 11/14/20 0051 98.6 F (37 C)     Temp Source 11/14/20 0051 Oral     SpO2 11/14/20 0051 100 %     Weight --      Height --      Head Circumference --      Peak Flow --      Pain Score 11/14/20 0054 10     Pain Loc --      Pain Edu? --      Excl. in [place]? --    CONSTITUTIONAL: Alert and oriented and responds appropriately to questions. Well-appearing; well-nourished; GCS 15 HEAD: Normocephalic; atraumatic EYES: Conjunctivae clear, PERRL, EOMI ENT: normal nose; no rhinorrhea; moist mucous membranes; pharynx without lesions noted; no dental injury; no septal hematoma NECK: Supple, no meningismus, no LAD; no midline spinal tenderness, step-off or deformity; trachea midline CARD: RRR; S1 and S2 appreciated; no murmurs, no clicks, no rubs, no gallops RESP: Normal chest excursion without splinting or tachypnea; breath sounds clear and equal bilaterally; no wheezes, no rhonchi, no rales; no hypoxia or respiratory distress CHEST:  chest wall stable, no crepitus or ecchymosis or  deformity, nontender to palpation; no flail chest ABD/GI: Normal bowel sounds; non-distended; soft, non-tender, no rebound, no guarding; no ecchymosis or other lesions noted PELVIS:  stable, nontender to palpation BACK:  The back appears normal and is non-tender to palpation, there is no CVA tenderness; no midline spinal tenderness,  step-off or deformity EXT: Right wrist deformity.  Normal capillary refill in the right hand.  Difficulty moving the right fingers due to pain.  Reports some tingling in the right thumb but otherwise sensation to light touch intact.  Compartments soft.  Small bruise noted to the fourth left MCP without bony tenderness, soft tissue swelling, deformity.  2+ radial pulses bilaterally. SKIN: Normal color for age and race; warm NEURO: Moves all extremities equally, normal speech, no facial asymmetry PSYCH: The patient's mood and manner are appropriate. Grooming and personal hygiene are appropriate.  ____________________________________________   LABS (all labs ordered are listed, but only abnormal results are displayed)  Labs Reviewed  CBG MONITORING, ED   ____________________________________________  EKG   ____________________________________________  RADIOLOGY I, Esperansa Sarabia, personally viewed and evaluated these images (plain radiographs) as part of my medical decision making, as well as reviewing the written report by the radiologist.  ED MD interpretation: Distal impacted comminuted and angulated fracture of the right radius.  Official radiology report(s): DG Wrist 2 Views Right  Result Date: 11/14/2020 CLINICAL DATA:  Post reduction right wrist. EXAM: RIGHT WRIST - 2 VIEW COMPARISON:  X-ray right wrist 11/14/2020. FINDINGS: Interval cast placement with improved anatomical alignment of a distal right radial fracture and ulnar fracture. Impacted distal right radial fracture with persistent but improved dorsal displacement and angulation as well as lateral  displacement. Persistent displaced right ulnar fracture. Question old healed mid radial shaft fracture. Associated subcutaneus soft tissue edema. IMPRESSION: 1. Impacted distal right radial fracture with persistent but improved dorsal displacement and angulation as well as lateral displacement. 2. Persistent displaced right ulnar fracture. Electronically Signed   By: Iven Finn M.D.   On: 11/14/2020 05:59   DG Wrist Complete Right  Result Date: 11/14/2020 CLINICAL DATA:  Fall, right wrist deformity EXAM: RIGHT WRIST - COMPLETE 3+ VIEW COMPARISON:  None. FINDINGS: Four view radiograph right wrist demonstrates a a comminuted, impacted fracture of the distal right radius with the major fracture plane extending transversely through the distal radial metaphysis. There is roughly 8-9 mm override, 5-6 mm medial displacement,, 1/2 shaft with dorsal displacement 18 degrees radial angulation, and 35 degrees dorsal angulation of the distal fracture fragment. The distal radial articular surface demonstrates marked dorsal tilt and there is complete loss of the normal radial inclination of the distal radius. Radiocarpal articulation has been preserved. There is an associated fracture of the base of the ulnar styloid with 5 mm medial displacement of the a distal fracture fragment. Extensive surrounding soft tissue swelling. IMPRESSION: Comminuted, displaced, impacted, angulated fracture of the a distal radius, as outlined above, with resultant marked dorsal tilt and loss of normal radial inclination of the distal radial articular surface. Associated displaced ulnar styloid base fracture. Electronically Signed   By: Fidela Salisbury MD   On: 11/14/2020 01:30    ____________________________________________   PROCEDURES  Procedure(s) performed (including Critical Care):  .Sedation  Date/Time: 11/14/2020 5:13 AM Performed by: Elisse Pennick, Delice Bison, DO Authorized by: Jazmina Muhlenkamp, Delice Bison, DO   Consent:    Consent obtained:   Written   Consent given by:  Patient   Risks discussed:  Allergic reaction, dysrhythmia, inadequate sedation, nausea, vomiting, respiratory compromise necessitating ventilatory assistance and intubation, prolonged sedation necessitating reversal and prolonged hypoxia resulting in organ damage   Alternatives discussed:  Regional anesthesia and analgesia without sedation Universal protocol:    Procedure explained and questions answered to patient or proxy's satisfaction: yes     Relevant documents  present and verified: yes     Test results available: yes     Imaging studies available: yes     Required blood products, implants, devices, and special equipment available: yes     Site/side marked: yes     Immediately prior to procedure, a time out was called: yes     Patient identity confirmed:  Verbally with patient Indications:    Procedure performed:  Fracture reduction   Procedure necessitating sedation performed by:  Physician performing sedation Pre-sedation assessment:    Time since last food or drink:  Midnight   ASA classification: class 2 - patient with mild systemic disease     Mouth opening:  3 or more finger widths   Thyromental distance:  3 finger widths   Mallampati score:  I - soft palate, uvula, fauces, pillars visible   Pre-sedation assessments completed and reviewed: airway patency, cardiovascular function, hydration status, mental status, nausea/vomiting, pain level, respiratory function and temperature     History of difficult intubation: yes     Pre-sedation assessment completed:  11/14/2020 4:50 AM Immediate pre-procedure details:    Reassessment: Patient reassessed immediately prior to procedure     Reviewed: vital signs, relevant labs/tests and NPO status     Verified: bag valve mask available, emergency equipment available, intubation equipment available, IV patency confirmed, oxygen available, reversal medications available and suction available   Procedure details  (see MAR for exact dosages):    Preoxygenation:  Nasal cannula   Sedation:  Propofol   Intended level of sedation: deep   Analgesia:  Hydromorphone   Intra-procedure monitoring:  Blood pressure monitoring, cardiac monitor, continuous pulse oximetry, continuous capnometry, frequent LOC assessments and frequent vital sign checks   Intra-procedure events: none     Total Provider sedation time (minutes):  15 Post-procedure details:    Post-sedation assessment completed:  11/14/2020 5:14 AM   Attendance: Constant attendance by certified staff until patient recovered     Recovery: Patient returned to pre-procedure baseline     Post-sedation assessments completed and reviewed: airway patency, cardiovascular function, hydration status, mental status, nausea/vomiting, pain level, respiratory function and temperature     Patient is stable for discharge or admission: yes     Procedure completion:  Tolerated well, no immediate complications Reduction of fracture  Date/Time: 11/14/2020 5:15 AM Performed by: Kru Allman, Delice Bison, DO Authorized by: Heavan Francom, Delice Bison, DO  Consent: Written consent obtained. Risks and benefits: risks, benefits and alternatives were discussed Consent given by: patient Patient understanding: patient states understanding of the procedure being performed Patient consent: the patient's understanding of the procedure matches consent given Procedure consent: procedure consent matches procedure scheduled Relevant documents: relevant documents present and verified Test results: test results available and properly labeled Site marked: the operative site was marked Imaging studies: imaging studies available Required items: required blood products, implants, devices, and special equipment available Patient identity confirmed: verbally with patient Time out: Immediately prior to procedure a "time out" was called to verify the correct patient, procedure, equipment, support staff and site/side  marked as required. Preparation: Patient was prepped and draped in the usual sterile fashion. Local anesthesia used: no  Anesthesia: Local anesthesia used: no  Sedation: Patient sedated: yes Sedation type: moderate (conscious) sedation Sedatives: propofol and see MAR for details Analgesia: hydromorphone and see MAR for details Sedation start date/time: 11/14/2020 5:06 AM Sedation end date/time: 11/14/2020 5:11 AM Vitals: Vital signs were monitored during sedation.  Patient tolerance: patient tolerated the procedure well with  no immediate complications  .Splint Application  Date/Time: 11/14/2020 5:16 AM Performed by: Corliss Skains, NT Authorized by: Sung Parodi, Delice Bison, DO   Consent:    Consent obtained:  Verbal   Consent given by:  Patient   Risks, benefits, and alternatives were discussed: yes     Risks discussed:  Discoloration, numbness, pain and swelling   Alternatives discussed:  Alternative treatment Universal protocol:    Procedure explained and questions answered to patient or proxy's satisfaction: yes     Relevant documents present and verified: yes     Test results available: yes     Imaging studies available: yes     Required blood products, implants, devices, and special equipment available: yes     Site/side marked: yes     Immediately prior to procedure a time out was called: yes     Patient identity confirmed:  Verbally with patient Pre-procedure details:    Distal neurologic exam:  Normal   Distal perfusion: distal pulses strong   Procedure details:    Location:  Wrist   Wrist location:  R wrist   Strapping: no     Cast type:  Short arm   Splint type:  Sugar tong   Supplies:  Fiberglass, cotton padding and sling Post-procedure details:    Distal neurologic exam:  Normal   Distal perfusion: distal pulses strong and brisk capillary refill     Procedure completion:  Tolerated well, no immediate complications   Post-procedure imaging: reviewed       CRITICAL CARE Performed by: Pryor Curia   Total critical care time: 45 minutes  Critical care time was exclusive of separately billable procedures and treating other patients.  Critical care was necessary to treat or prevent imminent or life-threatening deterioration.  Critical care was time spent personally by me on the following activities: development of treatment plan with patient and/or surrogate as well as nursing, discussions with consultants, evaluation of patient's response to treatment, examination of patient, obtaining history from patient or surrogate, ordering and performing treatments and interventions, ordering and review of laboratory studies, ordering and review of radiographic studies, pulse oximetry and re-evaluation of patient's condition.  ____________________________________________   INITIAL IMPRESSION / ASSESSMENT AND PLAN / ED COURSE  As part of my medical decision making, I reviewed the following data within the [place] History obtained from family, Nursing notes reviewed and incorporated, Old chart reviewed, Radiograph reviewed , Notes from prior ED visits and [place] Controlled Substance Database         Patient here with comminuted, displaced, impacted and angulated fracture of the distal right radius.  Neurovascularly intact distally.  No other signs of traumatic injury on exam other than a small bruise to the left hand.  No bony tenderness here and she has declined an x-ray.  Will give pain medication.  Consented for procedural sedation and postreduction in the ED.  Will place in sugar-tong splint and sling and give outpatient orthopedic follow-up.  Husband is at bedside to take her home.  ED PROGRESS  Patient tolerated reduction.  A postreduction x-ray shows improvement in displacement and angulation.  Continues to be neurovascular intact distally.  Will discharge in sugar-tong splint with sling with prescription of pain medication  and outpatient orthopedic follow-up.  At this time, I do not feel there is any life-threatening condition present. I have reviewed, interpreted and discussed all results (EKG, imaging, lab, urine as appropriate) and exam findings with patient/family. I have reviewed nursing  notes and appropriate previous records.  I feel the patient is safe to be discharged home without further emergent workup and can continue workup as an outpatient as needed. Discussed usual and customary return precautions. Patient/family verbalize understanding and are comfortable with this plan.  Outpatient follow-up has been provided as needed. All questions have been answered.  ____________________________________________   FINAL CLINICAL IMPRESSION(S) / ED DIAGNOSES  Final diagnoses:  Closed fracture of distal ends of right radius and ulna, initial encounter  Right wrist pain     ED Discharge Orders         Ordered    oxyCODONE-acetaminophen (PERCOCET) 5-325 MG tablet  Every 6 hours PRN        11/14/20 0638    ondansetron (ZOFRAN ODT) 4 MG disintegrating tablet  Every 6 hours PRN        11/14/20 7737          *Please note:  Ashley [name] was evaluated in Emergency Department on 11/14/2020 for the symptoms described in the history of present illness. She was evaluated in the context of the global COVID-19 pandemic, which necessitated consideration that the patient might be at risk for infection with the SARS-CoV-2 virus that causes COVID-19. Institutional protocols and algorithms that pertain to the evaluation of patients at risk for COVID-19 are in a state of rapid change based on information released by regulatory bodies including the CDC and federal and state organizations. These policies and algorithms were followed during the patient's care in the ED.  Some ED evaluations and interventions may be delayed as a result of limited staffing during and the pandemic.*   Note:  This document was prepared using  Dragon voice recognition software and may include unintentional dictation errors.   Jamir Rone, Delice Bison, DO 11/14/20 [phone]

## 2020-11-15 ENCOUNTER — Encounter (HOSPITAL_BASED_OUTPATIENT_CLINIC_OR_DEPARTMENT_OTHER): Payer: Self-pay | Admitting: Orthopaedic Surgery

## 2020-11-15 ENCOUNTER — Other Ambulatory Visit: Payer: Self-pay

## 2020-11-15 DIAGNOSIS — M25511 Pain in right shoulder: Secondary | ICD-10-CM | POA: Diagnosis not present

## 2020-11-15 NOTE — Progress Notes (Addendum)
ADDENDUM:  Chart reviewed by anesthesia, Dr Margarita Grizzle MDA,  Ok to proceed.   Spoke w/ via phone for pre-op interview--- Pt Lab needs dos----  [place] ask MDA dos if need EKG repeated since last one abnormal  In setting of severe covid   Lab results------ had lab work done 10-24-2020 result in epic (CBCdiff, CMP);  Has current ekg in epic   COVID test -----patient states asymptomatic no test needed Arrive at ------- 0930 oin 11-16-2020 NPO after MN w/ exception sips of water w/ meds  (pt has gastroparesis) Med rec completed Medications to take morning of surgery ----- Xyzal, Synthroid, Cymbalta, Buspar, Prevacid, Estradiol, Metoprolol, Lyrica Diabetic medication ----- pt has insulin pump w/ novolog , pt advised to keep on until arrive in pre-op  Patient instructed no nail polish to be worn day of surgery Patient instructed to bring photo id and insurance card day of surgery Patient aware to have Driver (ride ) / caregiver    for 24 hours after surgery -- husband, Barbaraann Rondo Patient Special Instructions ----- pt advised to do nebulizer night before surgery and bring rescue dos Pre-Op special Istructions ----- n/a Patient verbalized understanding of instructions that were given at this phone interview. Patient denies shortness of breath, chest pain, fever, cough at this phone interview.   Anesthesia Review:  HTN;  IDDM type1, insulin pump;  Hx severe covid 01/ 2022 admission in epic was intubated but no oxygen when discharged;  PAD s/p bilateral CIA PCI stenting;  Moderate asthma/ COPD;  OSA per pt no cpap use since 05/ 2021;  Pt denies chest pain, sob with exertion, and occasional peripheral swelling (but not today)  PCP:  Dr Dagmar Hait Cardiologist /  Vascular:  Dr Gwenlyn Found for PAD (lov 07-25-2020 epic) Chest x-ray : 06-27-2020 EKG : 06-21-2020 Echo : 06-24-2020 Stress test: no Cardiac Cath :  no Activity level:  DOE Sleep Study/ CPAP :  Yes/  NO Fasting Blood Sugar :  200--300    / Checks Blood  Sugar -- times a day:  6 to 10 times daily Blood Thinner/ Instructions /Last Dose: Plavix ASA / Instructions/ Last Dose :  NO Per Dr Griffin Basil pt does need to stop prior to surgery

## 2020-11-15 NOTE — H&P (Signed)
  PREOPERATIVE H&P  Chief Complaint: right wrist fracture  HPI: Ashley [name] is a 55 y.o. female who is scheduled for, Procedure(s): OPEN REDUCTION INTERNAL FIXATION (ORIF) DISTAL RADIAL FRACTURE.   Patient has a past medical history significant for sleep apnea on CPAP, hypothyroidism, HTN, HLD, GERD, DM, PAD with history of biiliac stents on Plavix.   Patient had an injury on 11/14/2020 when she tripped and fell over her dog. She landed on her right wrist. She had immediate pain. She went to [facility] ED. X-rays showed displaced fracture of the distal radius. Wrist was reduced in the ED. Patient was placed in a splint. She was sent to orthopedics for surgical discussion.   Her symptoms are rated as moderate to severe, and have been worsening.  This is significantly impairing activities of daily living.    Please see clinic note for further details on this patient's care.    She has elected for surgical management.   Past Medical History:  Diagnosis Date  . Anxiety   . Asthma   . Depression   . Diabetes mellitus (HCC)   . Esophageal stricture   . Gastroparesis   . GERD (gastroesophageal reflux disease)   . Glaucoma   . Graves disease 02/2010   diagnosed by radioactive iodine uptake and scan   . Helicobacter pylori infection   . Hyperlipidemia   . Hypertension   . Hypothyroidism   . Insulin pump in place   . Nephrolithiasis 2010   rt sided per GI  . Neuropathy    diabetic neuropathy  . Osteoporosis   . Sleep apnea   . Sleep apnea with use of continuous positive airway pressure (CPAP)   . Vitamin D deficiency    Past Surgical History:  Procedure Laterality Date  . ABDOMINAL AORTOGRAM W/LOWER EXTREMITY Bilateral 01/01/2019   Procedure: ABDOMINAL AORTOGRAM W/LOWER EXTREMITY;  Surgeon: Berry, Jonathan J, MD;  Location: [facility];  Service: Cardiovascular;  Laterality: Bilateral;  . BREAST EXCISIONAL BIOPSY    . BREAST LUMPECTOMY Left    LEFT-BENIGN   . NASAL SEPTOPLASTY W/ TURBINOPLASTY Bilateral 09/07/2013   Procedure: SEPTOPLASTY, BILATERAL TURBINATE RESECTION ;  Surgeon: Sui W Teoh, MD;  Location: [facility];  Service: ENT;  Laterality: Bilateral;  . PERIPHERAL VASCULAR INTERVENTION Bilateral 01/01/2019   Procedure: PERIPHERAL VASCULAR INTERVENTION;  Surgeon: Berry, Jonathan J, MD;  Location: [facility];  Service: Cardiovascular;  Laterality: Bilateral;  . UPPER GASTROINTESTINAL ENDOSCOPY    . VAGINAL HYSTERECTOMY  2006   Social History   Socioeconomic History  . Marital status: Married    Spouse name: Not on file  . Number of children: 1  . Years of education: Not on file  . Highest education level: Not on file  Occupational History  . Not on file  Tobacco Use  . Smoking status: Former Smoker    Packs/day: 0.50    Years: 25.00    Pack years: 12.50    Types: Cigarettes    Quit date: 06/20/2020    Years since quitting: 0.4  . Smokeless tobacco: Never Used  . Tobacco comment: 6-8 CIGS A DAY  Vaping Use  . Vaping Use: Never used  Substance and Sexual Activity  . Alcohol use: No    Alcohol/week: 0.0 standard drinks  . Drug use: No  . Sexual activity: Not on file  Other Topics Concern  . Not on file  Social History Narrative   Lives home with husband.    Education 12th grade.  Caffeine 4-5 drinks daily.   Social Determinants of Health   Financial Resource Strain: Not on file  Food Insecurity: Not on file  Transportation Needs: Not on file  Physical Activity: Not on file  Stress: Not on file  Social Connections: Not on file   Family History  Problem Relation Age of Onset  . Diabetes Father   . Heart attack Father   . Alcoholism Father   . Lung cancer Mother   . Liver cancer Mother   . Irritable bowel syndrome Sister   . Colon cancer Neg Hx   . Esophageal cancer Neg Hx   . Rectal cancer Neg Hx   . Stomach cancer Neg Hx    Allergies  Allergen Reactions  . Mirtazapine Rash    And hives   . Clindamycin Hives  . Other Hives    Tylox   . Sulfonamide Derivatives Hives  . Lipitor [Atorvastatin] Other (See Comments)    MUSCLE ACHES  . Restasis [Cyclosporine]     Pt stated, "My eyes turned red on the inside and outside of eye; burning sensation"  . Topamax [Topiramate] Itching   Prior to Admission medications   Medication Sig Start Date End Date Taking? Authorizing Provider  ACCU-CHEK COMPACT PLUS test strip 1 each by Other route as needed.  10/20/12   [provider]  ACCU-CHEK SOFTCLIX LANCETS lancets  05/19/18   [provider]  albuterol (PROVENTIL) (2.5 MG/3ML) 0.083% nebulizer solution Take 2.5 mg by nebulization every 4 (four) hours as needed for wheezing or shortness of breath.  03/18/15   [provider]  ALPRAZolam (XANAX) 0.5 MG tablet Take 0.5 mg by mouth 3 (three) times daily as needed for anxiety.  10/08/12   [provider]  AMBULATORY NON FORMULARY MEDICATION NOVOLOG INSULIN PUMP USES AS DIRECTED    [provider]  Blood Glucose Monitoring Suppl (ACCU-CHEK AVIVA PLUS) w/Device KIT  06/08/20   [provider]  busPIRone (BUSPAR) 15 MG tablet Take 15 mg by mouth 2 (two) times daily. 05/25/20   [provider]  clopidogrel (PLAVIX) 75 MG tablet TAKE 1 TABLET EVERY DAY WITH BREAKFAST 08/29/20   Berry, Jonathan J, MD  DULoxetine (CYMBALTA) 30 MG capsule Take 30 mg by mouth every morning. Takes 90 mg total 01/22/18   [provider]  DULoxetine (CYMBALTA) 60 MG capsule Take 60 mg by mouth every morning. Takes 90 mg total    [provider]  estradiol (ESTRACE) 0.5 MG tablet Take 0.5 mg by mouth daily. 04/07/20   [provider]  famotidine (PEPCID) 20 MG tablet Take 2 tablets (40 mg total) by mouth 2 (two) times daily. 07/06/20   Griffith, Kelly A, DO  folic acid (FOLVITE) 1 MG tablet Take 1 tablet by mouth daily. 06/23/19   [provider]  ibandronate (BONIVA) 150 MG tablet  Take 150 mg by mouth every 30 (thirty) days. 03/23/20   [provider]  Insulin Human (INSULIN PUMP) SOLN Inject into the skin.    [provider]  lansoprazole (PREVACID) 30 MG capsule Take 1 capsule (30 mg total) by mouth in the morning and at bedtime. 12/04/19   Perry, John N, MD  latanoprost (XALATAN) 0.005 % ophthalmic solution Place 1 drop into both eyes at bedtime. 12/26/17   [provider]  levocetirizine (XYZAL) 5 MG tablet Take 5 mg by mouth every morning.     [provider]  levothyroxine (SYNTHROID, LEVOTHROID) 112 MCG   tablet Take 112 mcg by mouth daily before breakfast.  01/07/18   [provider]  methotrexate (RHEUMATREX) 2.5 MG tablet 5 tablets by moth twice a day once a week 06/23/19   [provider]  montelukast (SINGULAIR) 10 MG tablet Take 10 mg by mouth at bedtime.    [provider]  NOVOLOG 100 UNIT/ML injection Inject into the skin See admin instructions. Pt has Insulin pump 08/06/18   [provider]  Nutritional Supplements (FEEDING SUPPLEMENT, NEPRO CARB STEADY,) LIQD Take 237 mLs by mouth 3 (three) times daily between meals. Patient not taking: Reported on 07/25/2020 07/06/20   Nicole Kindred A, DO  ondansetron (ZOFRAN ODT) 4 MG disintegrating tablet Take 1 tablet (4 mg total) by mouth every 6 (six) hours as needed for nausea or vomiting. 11/14/20   Ward, Delice Bison, DO  oxyCODONE-acetaminophen (PERCOCET) 5-325 MG tablet Take 2 tablets by mouth every 6 (six) hours as needed for severe pain. 11/14/20   Ward, Delice Bison, DO  pregabalin (LYRICA) 75 MG capsule Take 150 mg by mouth at bedtime.  07/24/18   [provider]  promethazine (PHENERGAN) 25 MG tablet Take 25 mg by mouth every 4 (four) hours as needed for nausea or vomiting. Every 4-6 hours as needed 08/30/14   [provider]  QUEtiapine (SEROQUEL) 50 MG tablet Take 1 tablet (50 mg total) by mouth at bedtime. 07/06/20   Nicole Kindred A, DO   rosuvastatin (CRESTOR) 20 MG tablet Take 20 mg by mouth at bedtime.    [provider]  Vitamin D, Ergocalciferol, (DRISDOL) 50000 UNITS CAPS Take 50,000 Units by mouth every 7 (seven) days.    [provider]    ROS: All other systems have been reviewed and were otherwise negative with the exception of those mentioned in the HPI and as above.  Physical Exam: General: Alert, no acute distress Cardiovascular: No pedal edema Respiratory: No cyanosis, no use of accessory musculature GI: No organomegaly, abdomen is soft and non-tender Skin: No lesions in the area of chief complaint Neurologic: Sensation intact distally Psychiatric: Patient is competent for consent with normal mood and affect Lymphatic: No axillary or cervical lymphadenopathy  MUSCULOSKELETAL:  Right upper extremity: Splint CDI. Skin intact though cannot assess fully beneath splint. Nontender to palpation proximally. Sensation intact in medial, radial, and ulnar distributions. Well perfused digits.    Imaging: Xrays of right wrist displaced distal radius fracture  Assessment: right wrist fracture  Plan: Plan for Procedure(s): OPEN REDUCTION INTERNAL FIXATION (ORIF) DISTAL RADIAL FRACTURE  The risks benefits and alternatives were discussed with the patient including but not limited to the risks of nonoperative treatment, versus surgical intervention including infection, bleeding, nerve injury,  blood clots, cardiopulmonary complications, morbidity, mortality, among others, and they were willing to proceed.   The patient acknowledged the explanation, agreed to proceed with the plan and consent was signed.   Operative Plan: ORIF right distal radius fracture Discharge Medications: Tylenol, Oxycodone, Zofran DVT Prophylaxis: None. On Plavix Physical Therapy: Outpatient OT Special Discharge needs: Splint. Sling for comfort   Ethelda Chick, PA-C  11/15/2020 9:32 AM

## 2020-11-15 NOTE — Anesthesia Preprocedure Evaluation (Addendum)
Anesthesia Evaluation  Patient identified by MRN, date of birth, ID band Patient awake    Reviewed: Allergy & Precautions, NPO status , Patient's Chart, lab work & pertinent test results  History of Anesthesia Complications (+) PONV  Airway Mallampati: II  TM Distance: >3 FB     Dental   Pulmonary asthma , sleep apnea , pneumonia, COPD, former smoker,    breath sounds clear to auscultation       Cardiovascular hypertension, + Peripheral Vascular Disease and + DOE   Rhythm:Regular Rate:Normal     Neuro/Psych PSYCHIATRIC DISORDERS Anxiety Depression  Neuromuscular disease    GI/Hepatic GERD  ,  Endo/Other  diabetesHypothyroidism   Renal/GU negative Renal ROS     Musculoskeletal   Abdominal   Peds  Hematology  (+) anemia ,   Anesthesia Other Findings   Reproductive/Obstetrics                           Anesthesia Physical Anesthesia Plan  ASA: III  Anesthesia Plan: General   Post-op Pain Management:    Induction: Intravenous  PONV Risk Score and Plan: 4 or greater and Ondansetron, Dexamethasone and Midazolam  Airway Management Planned: Oral ETT  Additional Equipment:   Intra-op Plan:   Post-operative Plan: Extubation in OR  Informed Consent: I have reviewed the patients History and Physical, chart, labs and discussed the procedure including the risks, benefits and alternatives for the proposed anesthesia with the patient or authorized representative who has indicated his/her understanding and acceptance.     Dental advisory given  Plan Discussed with: CRNA and Anesthesiologist  Anesthesia Plan Comments: (Patient added on the afternoon of 11/15/20 for surgery on 11/16/20. Is currently on Plavix and surgeon, per preop phone RN, surgeon plans to do her surgery despite anticoagulation. She has a history of COVID during January 2022 requiring intubation, OSA, and COPD. Was told to use  her inhaler the morning of surgery. Anesthesiologist on DOS will have to base decision of nerve block vs. GA/LMA/ETT based on clinical assessment of patient on the morning of surgery. Of note, she is a type 1 diabetic with known gastroparesis. Norton Blizzard, MD  )      Anesthesia Quick Evaluation

## 2020-11-16 ENCOUNTER — Other Ambulatory Visit: Payer: Self-pay

## 2020-11-16 ENCOUNTER — Ambulatory Visit (HOSPITAL_BASED_OUTPATIENT_CLINIC_OR_DEPARTMENT_OTHER)
Admission: RE | Admit: 2020-11-16 | Discharge: 2020-11-16 | Disposition: A | Discharge status: Home / Self Care | Payer: Medicare HMO | Source: Ambulatory Visit | Attending: Orthopaedic Surgery | Admitting: Orthopaedic Surgery

## 2020-11-16 ENCOUNTER — Ambulatory Visit (HOSPITAL_BASED_OUTPATIENT_CLINIC_OR_DEPARTMENT_OTHER): Payer: Medicare HMO | Admitting: Anesthesiology

## 2020-11-16 ENCOUNTER — Encounter (HOSPITAL_BASED_OUTPATIENT_CLINIC_OR_DEPARTMENT_OTHER)
Admission: RE | Disposition: A | Discharge status: Home / Self Care | Payer: Self-pay | Source: Ambulatory Visit | Attending: Orthopaedic Surgery

## 2020-11-16 ENCOUNTER — Encounter (HOSPITAL_BASED_OUTPATIENT_CLINIC_OR_DEPARTMENT_OTHER): Payer: Self-pay | Admitting: Orthopaedic Surgery

## 2020-11-16 DIAGNOSIS — Z794 Long term (current) use of insulin: Secondary | ICD-10-CM | POA: Diagnosis not present

## 2020-11-16 DIAGNOSIS — Z87891 Personal history of nicotine dependence: Secondary | ICD-10-CM | POA: Insufficient documentation

## 2020-11-16 DIAGNOSIS — Z79890 Hormone replacement therapy (postmenopausal): Secondary | ICD-10-CM | POA: Insufficient documentation

## 2020-11-16 DIAGNOSIS — Z9641 Presence of insulin pump (external) (internal): Secondary | ICD-10-CM | POA: Diagnosis not present

## 2020-11-16 DIAGNOSIS — K219 Gastro-esophageal reflux disease without esophagitis: Secondary | ICD-10-CM | POA: Diagnosis not present

## 2020-11-16 DIAGNOSIS — G4730 Sleep apnea, unspecified: Secondary | ICD-10-CM | POA: Diagnosis not present

## 2020-11-16 DIAGNOSIS — I10 Essential (primary) hypertension: Secondary | ICD-10-CM | POA: Insufficient documentation

## 2020-11-16 DIAGNOSIS — Z7983 Long term (current) use of bisphosphonates: Secondary | ICD-10-CM | POA: Diagnosis not present

## 2020-11-16 DIAGNOSIS — Z881 Allergy status to other antibiotic agents status: Secondary | ICD-10-CM | POA: Diagnosis not present

## 2020-11-16 DIAGNOSIS — E039 Hypothyroidism, unspecified: Secondary | ICD-10-CM | POA: Diagnosis not present

## 2020-11-16 DIAGNOSIS — Z882 Allergy status to sulfonamides status: Secondary | ICD-10-CM | POA: Diagnosis not present

## 2020-11-16 DIAGNOSIS — S52571A Other intraarticular fracture of lower end of right radius, initial encounter for closed fracture: Secondary | ICD-10-CM | POA: Diagnosis not present

## 2020-11-16 DIAGNOSIS — Z79899 Other long term (current) drug therapy: Secondary | ICD-10-CM | POA: Diagnosis not present

## 2020-11-16 DIAGNOSIS — W010XXA Fall on same level from slipping, tripping and stumbling without subsequent striking against object, initial encounter: Secondary | ICD-10-CM | POA: Diagnosis not present

## 2020-11-16 DIAGNOSIS — Z9989 Dependence on other enabling machines and devices: Secondary | ICD-10-CM | POA: Diagnosis not present

## 2020-11-16 DIAGNOSIS — Z888 Allergy status to other drugs, medicaments and biological substances status: Secondary | ICD-10-CM | POA: Insufficient documentation

## 2020-11-16 DIAGNOSIS — S52551A Other extraarticular fracture of lower end of right radius, initial encounter for closed fracture: Secondary | ICD-10-CM | POA: Insufficient documentation

## 2020-11-16 DIAGNOSIS — E785 Hyperlipidemia, unspecified: Secondary | ICD-10-CM | POA: Diagnosis not present

## 2020-11-16 DIAGNOSIS — Z7902 Long term (current) use of antithrombotics/antiplatelets: Secondary | ICD-10-CM | POA: Insufficient documentation

## 2020-11-16 DIAGNOSIS — E1151 Type 2 diabetes mellitus with diabetic peripheral angiopathy without gangrene: Secondary | ICD-10-CM | POA: Insufficient documentation

## 2020-11-16 DIAGNOSIS — S52541A Smith's fracture of right radius, initial encounter for closed fracture: Secondary | ICD-10-CM | POA: Diagnosis not present

## 2020-11-16 DIAGNOSIS — G4733 Obstructive sleep apnea (adult) (pediatric): Secondary | ICD-10-CM | POA: Diagnosis not present

## 2020-11-16 HISTORY — DX: Chronic obstructive pulmonary disease, unspecified: J44.9

## 2020-11-16 HISTORY — DX: Other specified postprocedural states: R11.2

## 2020-11-16 HISTORY — DX: Polyneuropathy, unspecified: G62.9

## 2020-11-16 HISTORY — DX: Unspecified asthma, uncomplicated: J45.909

## 2020-11-16 HISTORY — DX: Dyspnea, unspecified: R06.00

## 2020-11-16 HISTORY — DX: Personal history of other infectious and parasitic diseases: Z86.19

## 2020-11-16 HISTORY — DX: Other forms of dyspnea: R06.09

## 2020-11-16 HISTORY — DX: Fracture of unspecified carpal bone, right wrist, initial encounter for closed fracture: S62.101A

## 2020-11-16 HISTORY — DX: Iron deficiency anemia, unspecified: D50.9

## 2020-11-16 HISTORY — DX: Other specified postprocedural states: Z98.890

## 2020-11-16 HISTORY — DX: Personal history of urinary calculi: Z87.442

## 2020-11-16 HISTORY — DX: Peripheral vascular disease, unspecified: I73.9

## 2020-11-16 HISTORY — DX: Personal history of other endocrine, nutritional and metabolic disease: Z86.39

## 2020-11-16 HISTORY — DX: Personal history of other diseases of the digestive system: Z87.19

## 2020-11-16 HISTORY — DX: Obstructive sleep apnea (adult) (pediatric): G47.33

## 2020-11-16 HISTORY — PX: OPEN REDUCTION INTERNAL FIXATION (ORIF) DISTAL RADIAL FRACTURE: SHX5989

## 2020-11-16 HISTORY — DX: Type 1 diabetes mellitus without complications: E10.9

## 2020-11-16 HISTORY — DX: Unspecified glaucoma: H40.9

## 2020-11-16 LAB — GLUCOSE, CAPILLARY
Glucose-Capillary: 156 mg/dL — ABNORMAL HIGH (ref 70–99)
Glucose-Capillary: 137 mg/dL — ABNORMAL HIGH (ref 70–99)
Glucose-Capillary: 136 mg/dL — ABNORMAL HIGH (ref 70–99)

## 2020-11-16 SURGERY — OPEN REDUCTION INTERNAL FIXATION (ORIF) DISTAL RADIUS FRACTURE
Anesthesia: General | Laterality: Right

## 2020-11-16 MED ORDER — OXYCODONE HCL 5 MG PO TABS: ORAL_TABLET | ORAL | 0 refills | Status: AC

## 2020-11-16 MED ORDER — MIDAZOLAM HCL 2 MG/2ML IJ SOLN
INTRAMUSCULAR | Status: DC | PRN
  Administered 2020-11-16: 1 mg via INTRAVENOUS

## 2020-11-16 MED ORDER — VANCOMYCIN HCL 1000 MG IV SOLR
INTRAVENOUS | Status: DC | PRN
  Administered 2020-11-16: 1000 mg

## 2020-11-16 MED ORDER — CEFAZOLIN SODIUM-DEXTROSE 2-4 GM/100ML-% IV SOLN
INTRAVENOUS | Status: AC
  Filled 2020-11-16: qty 100

## 2020-11-16 MED ORDER — FENTANYL CITRATE (PF) 100 MCG/2ML IJ SOLN
INTRAMUSCULAR | Status: AC
  Filled 2020-11-16: qty 2

## 2020-11-16 MED ORDER — FENTANYL CITRATE (PF) 250 MCG/5ML IJ SOLN
INTRAMUSCULAR | Status: AC
  Filled 2020-11-16: qty 5

## 2020-11-16 MED ORDER — ONDANSETRON HCL 4 MG PO TABS: 4.0000 mg | ORAL_TABLET | Freq: Three times a day (TID) | ORAL | 0 refills | Status: AC | PRN

## 2020-11-16 MED ORDER — SUGAMMADEX SODIUM 200 MG/2ML IV SOLN
INTRAVENOUS | Status: DC | PRN
  Administered 2020-11-16: 200 mg via INTRAVENOUS

## 2020-11-16 MED ORDER — ONDANSETRON HCL 4 MG/2ML IJ SOLN
INTRAMUSCULAR | Status: DC | PRN
  Administered 2020-11-16: 4 mg via INTRAVENOUS

## 2020-11-16 MED ORDER — FENTANYL CITRATE (PF) 100 MCG/2ML IJ SOLN
INTRAMUSCULAR | Status: DC | PRN
  Administered 2020-11-16 (×3): 50 ug via INTRAVENOUS

## 2020-11-16 MED ORDER — BUPIVACAINE HCL 0.25 % IJ SOLN
INTRAMUSCULAR | Status: DC | PRN
  Administered 2020-11-16: 10 mL

## 2020-11-16 MED ORDER — OXYCODONE HCL 5 MG PO TABS
5.0000 mg | ORAL_TABLET | Freq: Once | ORAL | Status: AC
  Administered 2020-11-16: 5 mg via ORAL

## 2020-11-16 MED ORDER — PHENYLEPHRINE 40 MCG/ML (10ML) SYRINGE FOR IV PUSH (FOR BLOOD PRESSURE SUPPORT)
PREFILLED_SYRINGE | INTRAVENOUS | Status: AC
  Filled 2020-11-16: qty 20

## 2020-11-16 MED ORDER — ACETAMINOPHEN 500 MG PO TABS: 1000.0000 mg | ORAL_TABLET | Freq: Three times a day (TID) | ORAL | 0 refills | Status: AC

## 2020-11-16 MED ORDER — PHENYLEPHRINE 40 MCG/ML (10ML) SYRINGE FOR IV PUSH (FOR BLOOD PRESSURE SUPPORT)
PREFILLED_SYRINGE | INTRAVENOUS | Status: AC
  Filled 2020-11-16: qty 10

## 2020-11-16 MED ORDER — MIDAZOLAM HCL 2 MG/2ML IJ SOLN
INTRAMUSCULAR | Status: AC
  Filled 2020-11-16: qty 2

## 2020-11-16 MED ORDER — PROPOFOL 10 MG/ML IV BOLUS
INTRAVENOUS | Status: DC | PRN
  Administered 2020-11-16: 200 mg via INTRAVENOUS

## 2020-11-16 MED ORDER — ROCURONIUM BROMIDE 100 MG/10ML IV SOLN
INTRAVENOUS | Status: DC | PRN
  Administered 2020-11-16: 50 mg via INTRAVENOUS

## 2020-11-16 MED ORDER — CEFAZOLIN SODIUM-DEXTROSE 2-4 GM/100ML-% IV SOLN
2.0000 g | INTRAVENOUS | Status: AC
  Administered 2020-11-16: 2 g via INTRAVENOUS

## 2020-11-16 MED ORDER — FENTANYL CITRATE (PF) 100 MCG/2ML IJ SOLN
25.0000 ug | INTRAMUSCULAR | Status: DC | PRN
  Administered 2020-11-16: 25 ug via INTRAVENOUS

## 2020-11-16 MED ORDER — LACTATED RINGERS IV SOLN
INTRAVENOUS | Status: DC
  Administered 2020-11-16: 1000 mL via INTRAVENOUS

## 2020-11-16 MED ORDER — OXYCODONE HCL 5 MG PO TABS
ORAL_TABLET | ORAL | Status: AC
  Filled 2020-11-16: qty 1

## 2020-11-16 MED ORDER — LIDOCAINE HCL (CARDIAC) PF 100 MG/5ML IV SOSY
PREFILLED_SYRINGE | INTRAVENOUS | Status: DC | PRN
  Administered 2020-11-16: 80 mg via INTRAVENOUS

## 2020-11-16 MED ORDER — ROCURONIUM BROMIDE 10 MG/ML (PF) SYRINGE
PREFILLED_SYRINGE | INTRAVENOUS | Status: AC
  Filled 2020-11-16: qty 20

## 2020-11-16 SURGICAL SUPPLY — 69 items
APL SKNCLS STERI-STRIP NONHPOA (GAUZE/BANDAGES/DRESSINGS) ×1
BANDAGE ACE 3X5.8 VEL STRL LF (GAUZE/BANDAGES/DRESSINGS) ×2 IMPLANT
BENZOIN TINCTURE PRP APPL 2/3 (GAUZE/BANDAGES/DRESSINGS) ×2 IMPLANT
BIT DRILL 2.2 SS TIBIAL (BIT) ×2 IMPLANT
BLADE HEX COATED 2.75 (ELECTRODE) ×2 IMPLANT
BLADE SURG 15 STRL LF DISP TIS (BLADE) ×1 IMPLANT
BLADE SURG 15 STRL SS (BLADE) ×2
BNDG CMPR 9X4 STRL LF SNTH (GAUZE/BANDAGES/DRESSINGS) ×1
BNDG COHESIVE 4X5 TAN STRL (GAUZE/BANDAGES/DRESSINGS) IMPLANT
BNDG ELASTIC 4X5.8 VLCR STR LF (GAUZE/BANDAGES/DRESSINGS) ×2 IMPLANT
BNDG ESMARK 4X9 LF (GAUZE/BANDAGES/DRESSINGS) ×2 IMPLANT
BRUSH SCRUB EZ PLAIN DRY (MISCELLANEOUS) ×2 IMPLANT
CANISTER SUCT 1200ML W/VALVE (MISCELLANEOUS) IMPLANT
COVER BACK TABLE 60X90IN (DRAPES) ×2 IMPLANT
COVER WAND RF STERILE (DRAPES) ×2 IMPLANT
CUFF TOURN SGL QUICK 18X4 (TOURNIQUET CUFF) ×2 IMPLANT
DECANTER SPIKE VIAL GLASS SM (MISCELLANEOUS) IMPLANT
DRAPE C-ARM 35X43 STRL (DRAPES) IMPLANT
DRAPE EXTREMITY T 121X128X90 (DISPOSABLE) ×2 IMPLANT
DRAPE IMP U-DRAPE 54X76 (DRAPES) ×2 IMPLANT
DRAPE INCISE IOBAN 66X45 STRL (DRAPES) IMPLANT
DRAPE SURG 17X23 STRL (DRAPES) ×2 IMPLANT
DRSG EMULSION OIL 3X3 NADH (GAUZE/BANDAGES/DRESSINGS) IMPLANT
ELECT REM PT RETURN 9FT ADLT (ELECTROSURGICAL) ×2
ELECTRODE REM PT RTRN 9FT ADLT (ELECTROSURGICAL) ×1 IMPLANT
GAUZE SPONGE 4X4 12PLY STRL (GAUZE/BANDAGES/DRESSINGS) ×2 IMPLANT
GLOVE SRG 8 PF TXTR STRL LF DI (GLOVE) ×1 IMPLANT
GLOVE SURG ENC MOIS LTX SZ6.5 (GLOVE) ×2 IMPLANT
GLOVE SURG LTX SZ8 (GLOVE) ×2 IMPLANT
GLOVE SURG UNDER POLY LF SZ6.5 (GLOVE) ×2 IMPLANT
GLOVE SURG UNDER POLY LF SZ8 (GLOVE) ×2
GOWN STRL REUS W/TWL LRG LVL3 (GOWN DISPOSABLE) ×4 IMPLANT
HIBICLENS CHG 4% 4OZ (MISCELLANEOUS) ×2 IMPLANT
K-WIRE 1.6 (WIRE) ×4
K-WIRE FX5X1.6XNS BN SS (WIRE) ×2
KIT TURNOVER CYSTO (KITS) ×2 IMPLANT
KWIRE FX5X1.6XNS BN SS (WIRE) ×2 IMPLANT
NEEDLE HYPO 25X1 1.5 SAFETY (NEEDLE) IMPLANT
NS IRRIG 1000ML POUR BTL (IV SOLUTION) IMPLANT
PACK BASIN DAY SURGERY FS (CUSTOM PROCEDURE TRAY) ×2 IMPLANT
PAD CAST 4YDX4 CTTN HI CHSV (CAST SUPPLIES) ×1 IMPLANT
PADDING CAST COTTON 4X4 STRL (CAST SUPPLIES) ×2
PEG LOCKING SMOOTH 2.2X16 (Screw) ×4 IMPLANT
PEG LOCKING SMOOTH 2.2X18 (Peg) ×4 IMPLANT
PEG LOCKING SMOOTH 2.2X20 (Screw) ×2 IMPLANT
PENCIL SMOKE EVACUATOR (MISCELLANEOUS) ×2 IMPLANT
PLATE CROSSLOCK MINI RT (Plate) ×2 IMPLANT
SCREW LOCK 14X2.7X 3 LD TPR (Screw) ×1 IMPLANT
SCREW LOCKING 2.7X13MM (Screw) ×2 IMPLANT
SCREW LOCKING 2.7X14 (Screw) ×2 IMPLANT
SCREW LOCKING 2.7X8MM (Screw) ×2 IMPLANT
SCREW NLOCK 24X2.7 3 LD (Screw) ×1 IMPLANT
SCREW NONLOCK 2.7X24 (Screw) ×2 IMPLANT
SPLINT PLASTER CAST XFAST 3X15 (CAST SUPPLIES) ×10 IMPLANT
SPLINT PLASTER XTRA FASTSET 3X (CAST SUPPLIES) ×10
SPONGE LAP 4X18 RFD (DISPOSABLE) ×2 IMPLANT
STOCKINETTE 4X48 STRL (DRAPES) ×2 IMPLANT
STRIP CLOSURE SKIN 1/2X4 (GAUZE/BANDAGES/DRESSINGS) ×2 IMPLANT
SUCTION FRAZIER HANDLE 10FR (MISCELLANEOUS)
SUCTION TUBE FRAZIER 10FR DISP (MISCELLANEOUS) IMPLANT
SUT MNCRL AB 4-0 PS2 18 (SUTURE) IMPLANT
SUT VIC AB 3-0 SH 27 (SUTURE)
SUT VIC AB 3-0 SH 27X BRD (SUTURE) IMPLANT
SYR BULB EAR ULCER 3OZ GRN STR (SYRINGE) ×2 IMPLANT
SYR CONTROL 10ML LL (SYRINGE) ×2 IMPLANT
TOWEL OR 17X26 10 PK STRL BLUE (TOWEL DISPOSABLE) ×2 IMPLANT
TRAY DSU PREP LF (CUSTOM PROCEDURE TRAY) IMPLANT
TUBE CONNECTING 12X1/4 (SUCTIONS) IMPLANT
YANKAUER SUCT BULB TIP NO VENT (SUCTIONS) IMPLANT

## 2020-11-16 NOTE — Op Note (Signed)
Orthopaedic Surgery Operative Note (CSN: [id number])  Ashley [name]  Apr 08, 1966 Date of Surgery: 11/16/2020   Diagnoses:  Right extraarticular distal radius fracture  Procedure: Right extra-articular distal radius fracture open reduction internal fixation   Operative Finding Successful completion of the planned procedure.  Overall the patient's bone quality was reasonable.  She had somewhat more oozing due to her previous recent Plavix use.  We felt that our closure was appropriate and though she may have some extra bruising we think that she will do well.  Post-operative plan: The patient will be nonweightbearing.  The patient will be discharged home.  DVT prophylaxis not indicated in this ambulatory upper extremity patient without significant risk factors.   Pain control with PRN pain medication preferring oral medicines.  Follow up plan will be scheduled in approximately 7 days for incision check and XR.  Post-Op Diagnosis: Same Surgeons:Primary: Hiram Gash, MD Assistants:Caroline McBane PA-C Location: [facility] OR ROOM 3 Anesthesia: General with local anesthesia Antibiotics: Ancef 2 g with local vancomycin powder 1 g at the surgical site Tourniquet time:  Total Tourniquet Time Documented: Upper Arm (Right) - 21 minutes Total: Upper Arm (Right) - 21 minutes  Estimated Blood Loss: Minimal Complications: None Specimens: None Implants: Implant Name Type Inv. Item Serial No. Manufacturer Lot No. LRB No. Used Action  SCREW LP NL 2.7X22MM - [id number] Screw SCREW LP NL 2.7X22MM  ZIMMER RECON(ORTH,TRAU,BIO,SG) STERILIZED IN TRAY Right 1 Implanted  PEG LOCKING SMOOTH 2.2X18 - [id number] Peg PEG LOCKING SMOOTH 2.2X18  ZIMMER RECON(ORTH,TRAU,BIO,SG) STERILIZED IN TRAY Right 2 Implanted  PEG LOCKING SMOOTH 2.2X16 - [id number] Screw PEG LOCKING SMOOTH 2.2X16  ZIMMER RECON(ORTH,TRAU,BIO,SG) STERILIZED IN TRAY Right 2 Implanted  PEG LOCKING SMOOTH 2.2X20 - [id number] Screw PEG LOCKING SMOOTH 2.2X20   ZIMMER RECON(ORTH,TRAU,BIO,SG) STERILIZED IN TRAY Right 1 Implanted  SCREW LOCKING 2.7X14 - [id number] Screw SCREW LOCKING 2.7X14  ZIMMER RECON(ORTH,TRAU,BIO,SG) STERILIZED IN TRAY Right 1 Implanted  SCREW LOCKING 2.7X13MM - [id number] Screw SCREW LOCKING 2.7X13MM  ZIMMER RECON(ORTH,TRAU,BIO,SG) STERILIZED IN TRAY Right 1 Implanted  SCREW LOCKING 2.7X8MM - [id number] Screw SCREW LOCKING 2.7X8MM  ZIMMER RECON(ORTH,TRAU,BIO,SG) STERILIZED IN TRAY Right 1 Implanted  PLATE CROSSLOCK MINI RT - [id number] Plate PLATE CROSSLOCK MINI RT  ZIMMER RECON(ORTH,TRAU,BIO,SG) STERILIZED IN TRAY Right 1 Implanted    Indications for Surgery:   Ashley [name] is a 55 y.o. female with fall resulting with an extraductal distal radius fracture.  Patient displacement and in this young active patient due to pain control issues as well as early mobilization patient desired surgery.  Benefits and risks of operative and nonoperative management were discussed prior to surgery with patient/guardian(s) and informed consent form was completed.  Specific risks including infection, need for additional surgery, nonunion, malunion, neurovascular injury, CRPS amongst others   Procedure:   The patient was identified properly. Informed consent was obtained and the surgical site was marked. The patient was taken up to suite where general anesthesia was induced.  The patient was positioned supine on a hand table.  The right wrist was prepped and draped in the usual sterile fashion.  Timeout was performed before the beginning of the case.  Tourniquet was used for the above duration.  An FCR approach was made exposing the volar surface of the distal radius taking care to go through the sheath of the FCR tendon tract and ulnarly exposing the inferior portion of the sheath while protecting the median nerve and radial artery on each side with blunt retractors.  This inferior  portion of the sheath was incised sharply and examined for presence of  the palmar cutaneous branch of the median nerve.  It was determined to not be within the field and we carried our dissection deeply to the bone splitting the pronator quadratus and exposing the fracture site.    Appropriate reduction was obtained and a narrow short DVR Biomet plate was placed and checked for sizing and reduction under fluoroscopy.  This reduction was held in place with K wires and a K wire was placed into the radial styloid.    Once appropriate reduction was confirmed we then proceeded to fix the plate proximally and then proceeded to fill the distal holes with a combination of partially threaded screws and pegs.  At this point we checked our reduction to ensure that there was no intra-articular extension of our screws.  Once this was confirmed we proceeded to fill remaining 3 proximal shaft screws and obtained final images which demonstrated appropriate reduction and maintenance of alignment.  The DRUJ was checked and found to be stable.  We verified that all fast guides were removed on XR and through count.    The wound was thoroughly irrigated.  The tourniquet was released prior to skin closure to verify there was no excessive bleeding and we visualized that the radial artery and median nerve were intact at the end of the case. The PQ was reapproximated grossly prior to skin closure.     We irrigated the wound copiously before placing local antibiotic as listed above.  We closed the incision in a multilayer fashion with absorbable suture.  Sterile dressing was placed.  Small splint was placed.  Patient was awoken taken to PACU in stable condition.  Noemi Chapel, PA-C, present and scrubbed throughout the case, critical for completion in a timely fashion, and for retraction, instrumentation, closure.

## 2020-11-16 NOTE — Anesthesia Procedure Notes (Signed)
Procedure Name: Intubation Date/Time: 11/16/2020 1:26 PM Performed by: Georgeanne Nim, CRNA Pre-anesthesia Checklist: Patient identified, Emergency Drugs available, Suction available, Patient being monitored and Timeout performed Patient Re-evaluated:Patient Re-evaluated prior to induction Oxygen Delivery Method: Circle system utilized Preoxygenation: Pre-oxygenation with 100% oxygen Induction Type: IV induction Ventilation: Mask ventilation without difficulty Laryngoscope Size: Mac and 4 Grade View: Grade III Tube type: Oral Tube size: 7.0 mm Number of attempts: 1 Airway Equipment and Method: Stylet Placement Confirmation: ETT inserted through vocal cords under direct vision,  positive ETCO2,  CO2 detector and breath sounds checked- equal and bilateral Secured at: 21 cm Tube secured with: Tape Dental Injury: Teeth and Oropharynx as per pre-operative assessment  Difficulty Due To: Difficulty was anticipated and Difficult Airway- due to reduced neck mobility

## 2020-11-16 NOTE — Interval H&P Note (Signed)
All questions answered

## 2020-11-16 NOTE — Discharge Instructions (Signed)
Ophelia Charter MD, MPH Noemi Chapel, PA-C [facility] [address], [address] [phone] (fax)   POST-OPERATIVE INSTRUCTIONS   WOUND CARE ? Please keep splint clean dry and intact until followup.  ? You may shower on Post-Op Day #2.  ? You must keep splint dry during this process and may find that a plastic bag taped around the extremity or alternatively a towel based bath may be a better option.   ? If you get your splint wet or if it is damaged please contact our clinic.  EXERCISES ? Due to your splint being in place you will not be able to bear weight through your extremity.   Please use your sling until follow-up. ? Please continue to work on range of motion of your fingers and stretch these multiple times a day to prevent stiffness. ? Please continue to ambulate and do not stay sitting or lying for too long. Perform foot and wrist pumps to assist in circulation.  FOLLOW-UP ? If you develop a Fever (>101.5), Redness or Drainage from the surgical incision site, please call our office to arrange for an evaluation. ? Please call the office to schedule a follow-up appointment for your incision check if you do not already have one, 7-10 days post-operatively.  REGIONAL ANESTHESIA (NERVE BLOCKS) . The anesthesia team may have performed a nerve block for you if safe in the setting of your care.  This is a great tool used to minimize pain.  Typically the block may start wearing off overnight but the long acting medicine may last for 3-4 days.  The nerve block wearing off can be a challenging period but please utilize your as needed pain medications to try and manage this period.    POST-OP MEDICATIONS- Multimodal approach to pain control . In general your pain will be controlled with a combination of substances.  Prescriptions unless otherwise discussed are electronically sent to your pharmacy.  This is a carefully made plan we use to minimize  narcotic use.      - Acetaminophen - Non-narcotic pain medicine taken on a scheduled basis   - Oxycodone - This is a strong narcotic, to be used only on an "as needed" basis for pain.             -           Zofran - take as needed for nausea  HELPFUL INFORMATION  . If you had a block, it will wear off between 8-24 hrs postop typically.  This is period when your pain may go from nearly zero to the pain you would have had postop without the block.  This is an abrupt transition but nothing dangerous is happening.  You may take an extra dose of narcotic when this happens.  . You may be more comfortable sleeping in a semi-seated position the first few nights following surgery.  Keep a pillow propped under the elbow and forearm for comfort.  If you have a recliner type of chair it might be beneficial.  If not that is fine too, but it would be helpful to sleep propped up with pillows behind your operated shoulder as well under your elbow and forearm.  This will reduce pulling on the suture lines.  . When dressing, put your operative arm in the sleeve first.  When getting undressed, take your operative arm out last.  Loose fitting, button-down shirts are recommended.  Often in the first days after  surgery you may be more comfortable keeping your operative arm under your shirt and not through the sleeve.  . You may return to work/school in the next couple of days when you feel up to it.  Desk work and typing in the sling is fine.  . We suggest you use the pain medication the first night prior to going to bed, in order to ease any pain when the anesthesia wears off. You should avoid taking pain medications on an empty stomach as it will make you nauseous.  . You should wean off your narcotic medicines as soon as you are able.  Most patients will be off or using minimal narcotics before their first postop appointment.   . Do not drink alcoholic beverages or take illicit drugs when taking pain  medications.  . It is against the law to drive while taking narcotics.  In some states it is against the law to drive while your arm is in a sling.   . Pain medication may make you constipated.  Below are a few solutions to try in this order:   - Decrease the amount of pain medication if you aren't having pain.   - Drink lots of decaffeinated fluids.   - Drink prune juice and/or each dried prunes  . If the first 3 don't work start with additional solutions   - Take Colace - an over-the-counter stool softener   - Take Senokot - an over-the-counter laxative   - Take Miralax - a stronger over-the-counter laxative   For more information including helpful videos and documents visit our website:   [email or web address]    Post Anesthesia Home Care Instructions  Activity: Get plenty of rest for the remainder of the day. A responsible individual must stay with you for 24 hours following the procedure.  For the next 24 hours, DO NOT: -Drive a car -Paediatric nurse -Drink alcoholic beverages -Take any medication unless instructed by your physician -Make any legal decisions or sign important papers.  Meals: Start with liquid foods such as gelatin or soup. Progress to regular foods as tolerated. Avoid greasy, spicy, heavy foods. If nausea and/or vomiting occur, drink only clear liquids until the nausea and/or vomiting subsides. Call your physician if vomiting continues.  Special Instructions/Symptoms: Your throat may feel dry or sore from the anesthesia or the breathing tube placed in your throat during surgery. If this causes discomfort, gargle with warm salt water. The discomfort should disappear within 24 hours.

## 2020-11-16 NOTE — Transfer of Care (Signed)
Immediate Anesthesia Transfer of Care Note  Patient: Ashley [name]  Procedure(s) Performed: OPEN REDUCTION INTERNAL FIXATION (ORIF) DISTAL RADIAL FRACTURE (Right )  Patient Location: PACU  Anesthesia Type:General  Level of Consciousness: drowsy and patient cooperative  Airway & Oxygen Therapy: Patient Spontanous Breathing and Patient connected to nasal cannula oxygen  Post-op Assessment: Report given to RN and Post -op Vital signs reviewed and stable  Post vital signs: Reviewed and stable  Last Vitals:  Vitals Value Taken Time  BP    Temp    Pulse    Resp    SpO2      Last Pain:  Vitals:   11/16/20 1009  TempSrc:   PainSc: 7       Patients Stated Pain Goal: 7 (03/79/44 [id number])  Complications: No complications documented.

## 2020-11-16 NOTE — Anesthesia Postprocedure Evaluation (Signed)
Anesthesia Post Note  Patient: Ashley [name]  Procedure(s) Performed: OPEN REDUCTION INTERNAL FIXATION (ORIF) DISTAL RADIAL FRACTURE (Right )     Patient location during evaluation: PACU Anesthesia Type: General Level of consciousness: awake Pain management: pain level controlled Vital Signs Assessment: post-procedure vital signs reviewed and stable Respiratory status: spontaneous breathing Cardiovascular status: stable Postop Assessment: no apparent nausea or vomiting Anesthetic complications: no   No complications documented.  Last Vitals:  Vitals:   11/16/20 1530 11/16/20 1601  BP: (!) 171/84 (!) 177/88  Pulse: 99 90  Resp: 17 18  Temp:    SpO2: 92% 95%    Last Pain:  Vitals:   11/16/20 1530  TempSrc:   PainSc: 9                  Mads Borgmeyer

## 2020-11-17 ENCOUNTER — Institutional Professional Consult (permissible substitution): Payer: Medicare HMO | Admitting: Emergency Medicine

## 2020-11-17 ENCOUNTER — Encounter (HOSPITAL_BASED_OUTPATIENT_CLINIC_OR_DEPARTMENT_OTHER): Payer: Self-pay | Admitting: Orthopaedic Surgery

## 2020-11-24 DIAGNOSIS — S52541D Smith's fracture of right radius, subsequent encounter for closed fracture with routine healing: Secondary | ICD-10-CM | POA: Diagnosis not present

## 2020-12-09 DIAGNOSIS — E119 Type 2 diabetes mellitus without complications: Secondary | ICD-10-CM | POA: Diagnosis not present

## 2020-12-09 DIAGNOSIS — R209 Unspecified disturbances of skin sensation: Secondary | ICD-10-CM | POA: Diagnosis not present

## 2020-12-09 DIAGNOSIS — M25631 Stiffness of right wrist, not elsewhere classified: Secondary | ICD-10-CM | POA: Diagnosis not present

## 2020-12-09 DIAGNOSIS — M25531 Pain in right wrist: Secondary | ICD-10-CM | POA: Diagnosis not present

## 2020-12-09 DIAGNOSIS — M25541 Pain in joints of right hand: Secondary | ICD-10-CM | POA: Diagnosis not present

## 2020-12-09 DIAGNOSIS — E109 Type 1 diabetes mellitus without complications: Secondary | ICD-10-CM | POA: Diagnosis not present

## 2020-12-09 DIAGNOSIS — M25641 Stiffness of right hand, not elsewhere classified: Secondary | ICD-10-CM | POA: Diagnosis not present

## 2020-12-09 DIAGNOSIS — M6281 Muscle weakness (generalized): Secondary | ICD-10-CM | POA: Diagnosis not present

## 2020-12-09 DIAGNOSIS — W19XXXS Unspecified fall, sequela: Secondary | ICD-10-CM | POA: Diagnosis not present

## 2020-12-12 ENCOUNTER — Other Ambulatory Visit: Payer: Self-pay | Admitting: Internal Medicine

## 2020-12-13 DIAGNOSIS — M25641 Stiffness of right hand, not elsewhere classified: Secondary | ICD-10-CM | POA: Diagnosis not present

## 2020-12-13 DIAGNOSIS — M6281 Muscle weakness (generalized): Secondary | ICD-10-CM | POA: Diagnosis not present

## 2020-12-13 DIAGNOSIS — E119 Type 2 diabetes mellitus without complications: Secondary | ICD-10-CM | POA: Diagnosis not present

## 2020-12-13 DIAGNOSIS — R209 Unspecified disturbances of skin sensation: Secondary | ICD-10-CM | POA: Diagnosis not present

## 2020-12-13 DIAGNOSIS — M25631 Stiffness of right wrist, not elsewhere classified: Secondary | ICD-10-CM | POA: Diagnosis not present

## 2020-12-13 DIAGNOSIS — M25541 Pain in joints of right hand: Secondary | ICD-10-CM | POA: Diagnosis not present

## 2020-12-13 DIAGNOSIS — W19XXXS Unspecified fall, sequela: Secondary | ICD-10-CM | POA: Diagnosis not present

## 2020-12-13 DIAGNOSIS — M25531 Pain in right wrist: Secondary | ICD-10-CM | POA: Diagnosis not present

## 2020-12-14 ENCOUNTER — Other Ambulatory Visit: Payer: Self-pay | Admitting: Internal Medicine

## 2020-12-15 DIAGNOSIS — S52541D Smith's fracture of right radius, subsequent encounter for closed fracture with routine healing: Secondary | ICD-10-CM | POA: Diagnosis not present

## 2020-12-16 DIAGNOSIS — E119 Type 2 diabetes mellitus without complications: Secondary | ICD-10-CM | POA: Diagnosis not present

## 2020-12-16 DIAGNOSIS — M6281 Muscle weakness (generalized): Secondary | ICD-10-CM | POA: Diagnosis not present

## 2020-12-16 DIAGNOSIS — W19XXXS Unspecified fall, sequela: Secondary | ICD-10-CM | POA: Diagnosis not present

## 2020-12-16 DIAGNOSIS — M25531 Pain in right wrist: Secondary | ICD-10-CM | POA: Diagnosis not present

## 2020-12-16 DIAGNOSIS — R209 Unspecified disturbances of skin sensation: Secondary | ICD-10-CM | POA: Diagnosis not present

## 2020-12-16 DIAGNOSIS — M25631 Stiffness of right wrist, not elsewhere classified: Secondary | ICD-10-CM | POA: Diagnosis not present

## 2020-12-16 DIAGNOSIS — M25641 Stiffness of right hand, not elsewhere classified: Secondary | ICD-10-CM | POA: Diagnosis not present

## 2020-12-16 DIAGNOSIS — M25541 Pain in joints of right hand: Secondary | ICD-10-CM | POA: Diagnosis not present

## 2020-12-19 DIAGNOSIS — W19XXXS Unspecified fall, sequela: Secondary | ICD-10-CM | POA: Diagnosis not present

## 2020-12-19 DIAGNOSIS — M25631 Stiffness of right wrist, not elsewhere classified: Secondary | ICD-10-CM | POA: Diagnosis not present

## 2020-12-19 DIAGNOSIS — M6281 Muscle weakness (generalized): Secondary | ICD-10-CM | POA: Diagnosis not present

## 2020-12-19 DIAGNOSIS — M25531 Pain in right wrist: Secondary | ICD-10-CM | POA: Diagnosis not present

## 2020-12-19 DIAGNOSIS — R209 Unspecified disturbances of skin sensation: Secondary | ICD-10-CM | POA: Diagnosis not present

## 2020-12-19 DIAGNOSIS — M25641 Stiffness of right hand, not elsewhere classified: Secondary | ICD-10-CM | POA: Diagnosis not present

## 2020-12-19 DIAGNOSIS — M25541 Pain in joints of right hand: Secondary | ICD-10-CM | POA: Diagnosis not present

## 2020-12-19 DIAGNOSIS — E119 Type 2 diabetes mellitus without complications: Secondary | ICD-10-CM | POA: Diagnosis not present

## 2020-12-21 DIAGNOSIS — Z6833 Body mass index (BMI) 33.0-33.9, adult: Secondary | ICD-10-CM | POA: Diagnosis not present

## 2020-12-21 DIAGNOSIS — Z01419 Encounter for gynecological examination (general) (routine) without abnormal findings: Secondary | ICD-10-CM | POA: Diagnosis not present

## 2020-12-21 DIAGNOSIS — Z1231 Encounter for screening mammogram for malignant neoplasm of breast: Secondary | ICD-10-CM | POA: Diagnosis not present

## 2020-12-22 ENCOUNTER — Other Ambulatory Visit: Payer: Self-pay

## 2020-12-22 ENCOUNTER — Ambulatory Visit: Payer: Medicare HMO | Admitting: Pulmonary Disease

## 2020-12-22 ENCOUNTER — Encounter: Payer: Self-pay | Admitting: Pulmonary Disease

## 2020-12-22 ENCOUNTER — Ambulatory Visit (INDEPENDENT_AMBULATORY_CARE_PROVIDER_SITE_OTHER): Payer: Medicare HMO

## 2020-12-22 VITALS — BP 148/68 | HR 94 | Ht 59.0 in | Wt 166.0 lb

## 2020-12-22 DIAGNOSIS — R0602 Shortness of breath: Secondary | ICD-10-CM

## 2020-12-22 DIAGNOSIS — Z87891 Personal history of nicotine dependence: Secondary | ICD-10-CM

## 2020-12-22 DIAGNOSIS — Z8616 Personal history of COVID-19: Secondary | ICD-10-CM

## 2020-12-22 DIAGNOSIS — R059 Cough, unspecified: Secondary | ICD-10-CM | POA: Diagnosis not present

## 2020-12-22 MED ORDER — TIOTROPIUM BROMIDE-OLODATEROL 2.5-2.5 MCG/ACT IN AERS: 2.0000 | INHALATION_SPRAY | Freq: Every day | RESPIRATORY_TRACT | 0 refills | Status: DC

## 2020-12-22 NOTE — Patient Instructions (Addendum)
Thank you for visiting Dr. Valeta Harms at [facility]. Today we recommend the following:  Orders Placed This Encounter  Procedures   DG Chest 2 View   Ambulatory Referral for Lung Cancer Scre   Pulmonary Function Test   Samples of Stiolto + New prescription  Continue albuterol as needed   Return in about 6 weeks (around 02/02/2021) for with APP or Dr. Valeta Harms.    Please do your part to reduce the spread of COVID-19.

## 2020-12-22 NOTE — Progress Notes (Signed)
Synopsis: Referred in July 2022 for history of covid, by Prince Solian, MD  Subjective:   PATIENT ID: Ashley [name] GENDER: female DOB: 01/30/1966, MRN: [id number]  Chief Complaint  Patient presents with   Consult    Shortness of breath, history of covid January 3243    55 year old female, past medical history of COPD, former smoker, 40 years, half pack per day, 20-pack-year history, quit January 2021.  Gastroparesis, GERD, hypertension, hyperlipidemia.  Patient developed COVID-19 bilateral pneumonia in January 2022.  She was admitted to [facility] was intubated and placed on mechanical life support for 8 days.  She has had a subsequent long recovery but is now off oxygen.  She still feels short of breath and has cough at times.    Past Medical History:  Diagnosis Date   Anxiety    COPD (chronic obstructive pulmonary disease) (HCC)    Depression    DOE (dyspnea on exertion)    Gastroparesis    GERD (gastroesophageal reflux disease)    Glaucoma, both eyes    History of 2019 novel coronavirus disease (COVID-19) 06/20/2020   hospital admission-- dx severe covid with pneumonia, ARDS, DKA, COPD exaberation;  pt intubated 01-13th and extubated 01-20th;   (11-15-2020  per pt did not go home on oxygen, residual generalized weakness and hair loss)   History of esophageal stricture    post dilatation, followed by dr Henrene Pastor   History of Graves' disease    s/p RAI  [id number]   History of Helicobacter pylori infection    remote hx and treatment   History of kidney stones    Hyperlipidemia    Hypertension    followed by pcp   Hypothyroidism, postradioiodine therapy 02/2010   followed by pcp   IDA (iron deficiency anemia)    Insulin pump in place    w/ Novolog , managed by pcp   Moderate asthma    followed by pcp,  (11-15-2020  per pt has pulmonology appt on 11-17-2020))   OSA (obstructive sleep apnea)    11-15-2020  per pt has not used cpap since 05/ 2021   (previous followed by dr Brett Fairy,  study in epic 05-12-2018 moderate complex osa pt had used for awhile)   Osteoporosis    PAD (peripheral artery disease) ([place])    followed by dr berry----  s/p bilateral common iliac artery angioplasty stenting for stenosis   Peripheral neuropathy    PONV (postoperative nausea and vomiting)    Right wrist fracture    Type 1 diabetes mellitus with long-term current use of insulin ([place])    per pt dx at age 39 approx,  followed by pcp,  (11-15-2020  per pt last A1c 7.2 in 04/ 2022),  checks blood sugar 6 to 10 times daily , fasting sugar-- 200 -- 300)   Vitamin D deficiency      Family History  Problem Relation Age of Onset   Diabetes Father    Heart attack Father    Alcoholism Father    Lung cancer Mother    Liver cancer Mother    Irritable bowel syndrome Sister    Colon cancer Neg Hx    Esophageal cancer Neg Hx    Rectal cancer Neg Hx    Stomach cancer Neg Hx      Past Surgical History:  Procedure Laterality Date   ABDOMINAL AORTOGRAM W/LOWER EXTREMITY Bilateral 01/01/2019   Procedure: ABDOMINAL AORTOGRAM W/LOWER EXTREMITY;  Surgeon: Lorretta Harp, MD;  Location:  [facility];  Service: Cardiovascular;  Laterality: Bilateral;   BREAST EXCISIONAL BIOPSY Left 04-*02-2000  _0    per pt benign   CATARACT EXTRACTION W/ INTRAOCULAR LENS  IMPLANT, BILATERAL  11/2019   COLONOSCOPY  last one 2019 approx   LAPAROSCOPIC ASSISTED VAGINAL HYSTERECTOMY  06-22-2004 _1    NASAL SEPTOPLASTY W/ TURBINOPLASTY Bilateral 09/07/2013   Procedure: SEPTOPLASTY, BILATERAL TURBINATE RESECTION ;  Surgeon: Ascencion Dike, MD;  Location: [place];  Service: ENT;  Laterality: Bilateral;   OPEN REDUCTION INTERNAL FIXATION (ORIF) DISTAL RADIAL FRACTURE Right 11/16/2020   Procedure: OPEN REDUCTION INTERNAL FIXATION (ORIF) DISTAL RADIAL FRACTURE;  Surgeon: Hiram Gash, MD;  Location: [place];  Service: Orthopedics;  Laterality: Right;    PERIPHERAL VASCULAR INTERVENTION Bilateral 01/01/2019   Procedure: PERIPHERAL VASCULAR INTERVENTION;  Surgeon: Lorretta Harp, MD;  Location: [place] [facility];  Service: Cardiovascular;  Laterality: Bilateral;   UPPER GASTROINTESTINAL ENDOSCOPY  last one 07-23-2019  dr Henrene Pastor    Social History   Socioeconomic History   Marital status: Married    Spouse name: Not on file   Number of children: 1   Years of education: Not on file   Highest education level: Not on file  Occupational History   Not on file  Tobacco Use   Smoking status: Former    Packs/day: 0.50    Years: 25.00    Pack years: 12.50    Types: Cigarettes    Start date: 10    Quit date: 06/20/2020    Years since quitting: 0.5   Smokeless tobacco: Never   Tobacco comments:    Started smoking at age [age]  Vaping Use   Vaping Use: Never used  Substance and Sexual Activity   Alcohol use: No    Alcohol/week: 0.0 standard drinks   Drug use: No   Sexual activity: Not on file  Other Topics Concern   Not on file  Social History Narrative   Lives home with husband.  Education 12th grade.  Caffeine 4-5 drinks daily.   Social Determinants of Health   Financial Resource Strain: Not on file  Food Insecurity: Not on file  Transportation Needs: Not on file  Physical Activity: Not on file  Stress: Not on file  Social Connections: Not on file  Intimate Partner Violence: Not on file     Allergies  Allergen Reactions   Mirtazapine Hives and Rash   Clindamycin Hives   Other Hives    Tylox    Sulfonamide Derivatives Hives   Lipitor [Atorvastatin] Other (See Comments)    MUSCLE ACHES   Restasis [Cyclosporine]     Pt stated, "My eyes turned red on the inside and outside of eye; burning sensation"   Topamax [Topiramate] Itching     Outpatient Medications Prior to Visit  Medication Sig Dispense Refill   ACCU-CHEK COMPACT PLUS test strip 1 each by Other route as needed.      ACCU-CHEK SOFTCLIX LANCETS lancets       albuterol (PROVENTIL) (2.5 MG/3ML) 0.083% nebulizer solution Take 2.5 mg by nebulization every 4 (four) hours as needed for wheezing or shortness of breath.   3   Albuterol Sulfate, sensor, (PROAIR DIGIHALER) 108 (90 Base) MCG/ACT AEPB Inhale into the lungs as needed.     ALPRAZolam (XANAX) 0.5 MG tablet Take 0.5-1 mg by mouth 3 (three) times daily as needed for anxiety.     AMBULATORY NON FORMULARY MEDICATION NOVOLOG INSULIN PUMP USES AS  DIRECTED     Blood Glucose Monitoring Suppl (ACCU-CHEK AVIVA PLUS) w/Device KIT      busPIRone (BUSPAR) 15 MG tablet Take 15 mg by mouth 2 (two) times daily.     clopidogrel (PLAVIX) 75 MG tablet TAKE 1 TABLET EVERY DAY WITH BREAKFAST (Patient taking differently: Take 75 mg by mouth daily.) 90 tablet 3   DULoxetine (CYMBALTA) 30 MG capsule Take 30 mg by mouth every morning. Takes with 60 mg to total 90 mg  1   DULoxetine (CYMBALTA) 60 MG capsule Take 60 mg by mouth every morning. Takes with 20m     estradiol (ESTRACE) 0.5 MG tablet Take 0.5 mg by mouth daily.     famotidine (PEPCID) 40 MG tablet TAKE 1 TABLET AT BEDTIME 90 tablet 1   ferrous sulfate 325 (65 FE) MG EC tablet Take 325 mg by mouth at bedtime.     folic acid (FOLVITE) 1 MG tablet Take 1 tablet by mouth daily.     ibandronate (BONIVA) 150 MG tablet Take 150 mg by mouth every 30 (thirty) days.     Insulin Human (INSULIN PUMP) SOLN Inject into the skin. Novolog----  basal rate--- 12am to 2 am = 1.3units                                          2am  to  6am  = 1.4units                                          6am  to  ?     lansoprazole (PREVACID) 30 MG capsule Take 1 capsule (30 mg total) by mouth 2 (two) times daily before a meal. 180 capsule 1   latanoprost (XALATAN) 0.005 % ophthalmic solution Place 1 drop into both eyes at bedtime.  11   levocetirizine (XYZAL) 5 MG tablet Take 5 mg by mouth every morning.      levothyroxine (SYNTHROID) 125 MCG tablet Take 125 mcg by mouth daily before  breakfast.     methotrexate (RHEUMATREX) 2.5 MG tablet Take 15 mg by mouth once a week. 3 tablets am and at bedtime twice a day once a week,  Saturday's     montelukast (SINGULAIR) 10 MG tablet Take 10 mg by mouth at bedtime.     NOVOLOG 100 UNIT/ML injection Inject into the skin See admin instructions. Pt has Insulin pump     pregabalin (LYRICA) 75 MG capsule Take 75-150 mg by mouth 3 (three) times daily. Takes one cap in am and lunch, two cap at bedtime     QUEtiapine (SEROQUEL) 50 MG tablet Take 1 tablet (50 mg total) by mouth at bedtime. (Patient taking differently: Take 25 mg by mouth at bedtime.) 30 tablet 0   rosuvastatin (CRESTOR) 20 MG tablet Take 20 mg by mouth at bedtime.     Vitamin D, Ergocalciferol, (DRISDOL) 50000 UNITS CAPS Take 50,000 Units by mouth every 7 (seven) days. Saturday's     No facility-administered medications prior to visit.    Review of Systems  Constitutional:  Negative for chills, fever, malaise/fatigue and weight loss.  HENT:  Negative for hearing loss, sore throat and tinnitus.   Eyes:  Negative for blurred vision and double vision.  Respiratory:  Positive for cough and shortness  of breath. Negative for hemoptysis, sputum production, wheezing and stridor.   Cardiovascular:  Negative for chest pain, palpitations, orthopnea, leg swelling and PND.  Gastrointestinal:  Negative for abdominal pain, constipation, diarrhea, heartburn, nausea and vomiting.  Genitourinary:  Negative for dysuria, hematuria and urgency.  Musculoskeletal:  Negative for joint pain and myalgias.  Skin:  Negative for itching and rash.  Neurological:  Negative for dizziness, tingling, weakness and headaches.  Endo/Heme/Allergies:  Negative for environmental allergies. Does not bruise/bleed easily.  Psychiatric/Behavioral:  Negative for depression. The patient is not nervous/anxious and does not have insomnia.   All other systems reviewed and are negative.   Objective:  Physical  Exam Vitals reviewed.  Constitutional:      General: She is not in acute distress.    Appearance: She is well-developed.  HENT:     Head: Normocephalic and atraumatic.  Eyes:     General: No scleral icterus.    Conjunctiva/sclera: Conjunctivae normal.     Pupils: Pupils are equal, round, and reactive to light.  Neck:     Vascular: No JVD.     Trachea: No tracheal deviation.  Cardiovascular:     Rate and Rhythm: Normal rate and regular rhythm.     Heart sounds: Normal heart sounds. No murmur heard. Pulmonary:     Effort: Pulmonary effort is normal. No tachypnea, accessory muscle usage or respiratory distress.     Breath sounds: Normal breath sounds. No stridor. No wheezing, rhonchi or rales.  Abdominal:     General: Bowel sounds are normal. There is no distension.     Palpations: Abdomen is soft.     Tenderness: There is no abdominal tenderness.  Musculoskeletal:        General: No tenderness.     Cervical back: Neck supple.  Lymphadenopathy:     Cervical: No cervical adenopathy.  Skin:    General: Skin is warm and dry.     Capillary Refill: Capillary refill takes less than 2 seconds.     Findings: No rash.  Neurological:     Mental Status: She is alert and oriented to person, place, and time.  Psychiatric:        Behavior: Behavior normal.     Vitals:   12/22/20 1422  BP: (!) 148/68  Pulse: 94  SpO2: 98%  Weight: 166 lb (75.3 kg)  Height: _0  (1.499 m)   98% on RA BMI Readings from Last 3 Encounters:  12/22/20 33.53 kg/m  11/16/20 33.57 kg/m  11/14/20 29.79 kg/m   Wt Readings from Last 3 Encounters:  12/22/20 166 lb (75.3 kg)  11/16/20 166 lb 3.2 oz (75.4 kg)  11/14/20 147 lb 7.8 oz (66.9 kg)     CBC    Component Value Date/Time   WBC 15.4 (H) 07/06/2020 0416   RBC 3.11 (L) 07/06/2020 0416   HGB 9.6 (L) 07/06/2020 0416   HGB 11.6 01/09/2019 1204   HCT 29.5 (L) 07/06/2020 0416   HCT 36.2 01/09/2019 1204   PLT 302 07/06/2020 0416   PLT 388  01/09/2019 1204   MCV 94.9 07/06/2020 0416   MCV 82 01/09/2019 1204   MCH 30.9 07/06/2020 0416   MCHC 32.5 07/06/2020 0416   RDW 14.4 07/06/2020 0416   RDW 13.5 01/09/2019 1204   LYMPHSABS 3.0 07/06/2020 0416   MONOABS 0.9 07/06/2020 0416   EOSABS 0.1 07/06/2020 0416   BASOSABS 0.1 07/06/2020 0416     Chest Imaging: No recent chest imaging  CTA chest  06/21/2020: Bilateral groundglass opacities concerning for multifocal pneumonia related to COVID-19, enlarged mediastinal and hilar node consider reactive. No evidence of PE. The patient's images have been independently reviewed by me.    Pulmonary Functions Testing Results: No flowsheet data found.  FeNO:   Pathology:   Echocardiogram:   Heart Catheterization:     Assessment & Plan:     ICD-10-CM   1. SOB (shortness of breath)  R06.02 DG Chest 2 View    Ambulatory Referral for Lung Cancer Scre    Pulmonary Function Test    2. Former smoker  Z87.891 DG Chest 2 View    Ambulatory Referral for Lung Cancer Scre    Pulmonary Function Test    3. History of COVID-19  Z86.16 DG Chest 2 View      Discussion:  This is a 55 year old female, history of COVID-19 viral pneumonia, bilateral disease in January 2022, she was intubated on life support for 8 days.  Still has persistent cough and shortness of breath.  She is a former smoker, 40 years, 20-pack-year history.  Plan: Chest x-ray today. If infiltrates are still persistent can consider HRCT imaging in the future. PFTs prior to next office visit. Ambulatory referral to lung cancer screening as she is older than the age of 89 with a 20-pack-year history of smoking and recently quit. Samples today of Stiolto plus new prescription Continue albuterol as needed  Return to clinic in approximately 6 weeks with myself or APP to review PFT results.   Current Outpatient Medications:    ACCU-CHEK COMPACT PLUS test strip, 1 each by Other route as needed. , Disp: , Rfl:     ACCU-CHEK SOFTCLIX LANCETS lancets, , Disp: , Rfl:    albuterol (PROVENTIL) (2.5 MG/3ML) 0.083% nebulizer solution, Take 2.5 mg by nebulization every 4 (four) hours as needed for wheezing or shortness of breath. , Disp: , Rfl: 3   Albuterol Sulfate, sensor, (PROAIR DIGIHALER) 108 (90 Base) MCG/ACT AEPB, Inhale into the lungs as needed., Disp: , Rfl:    ALPRAZolam (XANAX) 0.5 MG tablet, Take 0.5-1 mg by mouth 3 (three) times daily as needed for anxiety., Disp: , Rfl:    AMBULATORY NON FORMULARY MEDICATION, NOVOLOG INSULIN PUMP USES AS DIRECTED, Disp: , Rfl:    Blood Glucose Monitoring Suppl (ACCU-CHEK AVIVA PLUS) w/Device KIT, , Disp: , Rfl:    busPIRone (BUSPAR) 15 MG tablet, Take 15 mg by mouth 2 (two) times daily., Disp: , Rfl:    clopidogrel (PLAVIX) 75 MG tablet, TAKE 1 TABLET EVERY DAY WITH BREAKFAST (Patient taking differently: Take 75 mg by mouth daily.), Disp: 90 tablet, Rfl: 3   DULoxetine (CYMBALTA) 30 MG capsule, Take 30 mg by mouth every morning. Takes with 60 mg to total 90 mg, Disp: , Rfl: 1   DULoxetine (CYMBALTA) 60 MG capsule, Take 60 mg by mouth every morning. Takes with 73m, Disp: , Rfl:    estradiol (ESTRACE) 0.5 MG tablet, Take 0.5 mg by mouth daily., Disp: , Rfl:    famotidine (PEPCID) 40 MG tablet, TAKE 1 TABLET AT BEDTIME, Disp: 90 tablet, Rfl: 1   ferrous sulfate 325 (65 FE) MG EC tablet, Take 325 mg by mouth at bedtime., Disp: , Rfl:    folic acid (FOLVITE) 1 MG tablet, Take 1 tablet by mouth daily., Disp: , Rfl:    ibandronate (BONIVA) 150 MG tablet, Take 150 mg by mouth every 30 (thirty) days., Disp: , Rfl:    Insulin Human (INSULIN PUMP) SOLN, Inject  into the skin. Novolog----  basal rate--- 12am to 2 am = 1.3units                                          2am  to  6am  = 1.4units                                          6am  to  ?, Disp: , Rfl:    lansoprazole (PREVACID) 30 MG capsule, Take 1 capsule (30 mg total) by mouth 2 (two) times daily before a meal., Disp: 180  capsule, Rfl: 1   latanoprost (XALATAN) 0.005 % ophthalmic solution, Place 1 drop into both eyes at bedtime., Disp: , Rfl: 11   levocetirizine (XYZAL) 5 MG tablet, Take 5 mg by mouth every morning. , Disp: , Rfl:    levothyroxine (SYNTHROID) 125 MCG tablet, Take 125 mcg by mouth daily before breakfast., Disp: , Rfl:    methotrexate (RHEUMATREX) 2.5 MG tablet, Take 15 mg by mouth once a week. 3 tablets am and at bedtime twice a day once a week,  Saturday's, Disp: , Rfl:    montelukast (SINGULAIR) 10 MG tablet, Take 10 mg by mouth at bedtime., Disp: , Rfl:    NOVOLOG 100 UNIT/ML injection, Inject into the skin See admin instructions. Pt has Insulin pump, Disp: , Rfl:    pregabalin (LYRICA) 75 MG capsule, Take 75-150 mg by mouth 3 (three) times daily. Takes one cap in am and lunch, two cap at bedtime, Disp: , Rfl:    QUEtiapine (SEROQUEL) 50 MG tablet, Take 1 tablet (50 mg total) by mouth at bedtime. (Patient taking differently: Take 25 mg by mouth at bedtime.), Disp: 30 tablet, Rfl: 0   rosuvastatin (CRESTOR) 20 MG tablet, Take 20 mg by mouth at bedtime., Disp: , Rfl:    Tiotropium Bromide-Olodaterol 2.5-2.5 MCG/ACT AERS, Inhale 2 puffs into the lungs daily., Disp: 2 each, Rfl: 0   Vitamin D, Ergocalciferol, (DRISDOL) 50000 UNITS CAPS, Take 50,000 Units by mouth every 7 (seven) days. Saturday's, Disp: , Rfl:    Garner Nash, DO [facility] 12/22/2020 3:35 PM

## 2020-12-23 DIAGNOSIS — M6281 Muscle weakness (generalized): Secondary | ICD-10-CM | POA: Diagnosis not present

## 2020-12-23 DIAGNOSIS — R209 Unspecified disturbances of skin sensation: Secondary | ICD-10-CM | POA: Diagnosis not present

## 2020-12-23 DIAGNOSIS — M25631 Stiffness of right wrist, not elsewhere classified: Secondary | ICD-10-CM | POA: Diagnosis not present

## 2020-12-23 DIAGNOSIS — E119 Type 2 diabetes mellitus without complications: Secondary | ICD-10-CM | POA: Diagnosis not present

## 2020-12-23 DIAGNOSIS — W19XXXS Unspecified fall, sequela: Secondary | ICD-10-CM | POA: Diagnosis not present

## 2020-12-23 DIAGNOSIS — M25641 Stiffness of right hand, not elsewhere classified: Secondary | ICD-10-CM | POA: Diagnosis not present

## 2020-12-23 DIAGNOSIS — M25541 Pain in joints of right hand: Secondary | ICD-10-CM | POA: Diagnosis not present

## 2020-12-23 DIAGNOSIS — M25531 Pain in right wrist: Secondary | ICD-10-CM | POA: Diagnosis not present

## 2020-12-26 DIAGNOSIS — M6281 Muscle weakness (generalized): Secondary | ICD-10-CM | POA: Diagnosis not present

## 2020-12-26 DIAGNOSIS — E119 Type 2 diabetes mellitus without complications: Secondary | ICD-10-CM | POA: Diagnosis not present

## 2020-12-26 DIAGNOSIS — M25541 Pain in joints of right hand: Secondary | ICD-10-CM | POA: Diagnosis not present

## 2020-12-26 DIAGNOSIS — W19XXXS Unspecified fall, sequela: Secondary | ICD-10-CM | POA: Diagnosis not present

## 2020-12-26 DIAGNOSIS — M25631 Stiffness of right wrist, not elsewhere classified: Secondary | ICD-10-CM | POA: Diagnosis not present

## 2020-12-26 DIAGNOSIS — M25641 Stiffness of right hand, not elsewhere classified: Secondary | ICD-10-CM | POA: Diagnosis not present

## 2020-12-26 DIAGNOSIS — M25531 Pain in right wrist: Secondary | ICD-10-CM | POA: Diagnosis not present

## 2020-12-26 DIAGNOSIS — R209 Unspecified disturbances of skin sensation: Secondary | ICD-10-CM | POA: Diagnosis not present

## 2020-12-28 DIAGNOSIS — M25631 Stiffness of right wrist, not elsewhere classified: Secondary | ICD-10-CM | POA: Diagnosis not present

## 2020-12-28 DIAGNOSIS — M6281 Muscle weakness (generalized): Secondary | ICD-10-CM | POA: Diagnosis not present

## 2020-12-28 DIAGNOSIS — M25531 Pain in right wrist: Secondary | ICD-10-CM | POA: Diagnosis not present

## 2020-12-28 DIAGNOSIS — R209 Unspecified disturbances of skin sensation: Secondary | ICD-10-CM | POA: Diagnosis not present

## 2020-12-28 DIAGNOSIS — M25641 Stiffness of right hand, not elsewhere classified: Secondary | ICD-10-CM | POA: Diagnosis not present

## 2020-12-28 DIAGNOSIS — E119 Type 2 diabetes mellitus without complications: Secondary | ICD-10-CM | POA: Diagnosis not present

## 2020-12-28 DIAGNOSIS — M25541 Pain in joints of right hand: Secondary | ICD-10-CM | POA: Diagnosis not present

## 2020-12-28 DIAGNOSIS — W19XXXS Unspecified fall, sequela: Secondary | ICD-10-CM | POA: Diagnosis not present

## 2021-01-01 ENCOUNTER — Encounter: Payer: Self-pay | Admitting: *Deleted

## 2021-01-02 ENCOUNTER — Other Ambulatory Visit: Payer: Self-pay | Admitting: *Deleted

## 2021-01-02 DIAGNOSIS — Z87891 Personal history of nicotine dependence: Secondary | ICD-10-CM

## 2021-01-02 DIAGNOSIS — R209 Unspecified disturbances of skin sensation: Secondary | ICD-10-CM | POA: Diagnosis not present

## 2021-01-02 DIAGNOSIS — M25531 Pain in right wrist: Secondary | ICD-10-CM | POA: Diagnosis not present

## 2021-01-02 DIAGNOSIS — M25641 Stiffness of right hand, not elsewhere classified: Secondary | ICD-10-CM | POA: Diagnosis not present

## 2021-01-02 DIAGNOSIS — M6281 Muscle weakness (generalized): Secondary | ICD-10-CM | POA: Diagnosis not present

## 2021-01-02 DIAGNOSIS — W19XXXS Unspecified fall, sequela: Secondary | ICD-10-CM | POA: Diagnosis not present

## 2021-01-02 DIAGNOSIS — M25541 Pain in joints of right hand: Secondary | ICD-10-CM | POA: Diagnosis not present

## 2021-01-02 DIAGNOSIS — E119 Type 2 diabetes mellitus without complications: Secondary | ICD-10-CM | POA: Diagnosis not present

## 2021-01-02 DIAGNOSIS — M25631 Stiffness of right wrist, not elsewhere classified: Secondary | ICD-10-CM | POA: Diagnosis not present

## 2021-01-04 DIAGNOSIS — W19XXXS Unspecified fall, sequela: Secondary | ICD-10-CM | POA: Diagnosis not present

## 2021-01-04 DIAGNOSIS — E119 Type 2 diabetes mellitus without complications: Secondary | ICD-10-CM | POA: Diagnosis not present

## 2021-01-04 DIAGNOSIS — M25541 Pain in joints of right hand: Secondary | ICD-10-CM | POA: Diagnosis not present

## 2021-01-04 DIAGNOSIS — M6281 Muscle weakness (generalized): Secondary | ICD-10-CM | POA: Diagnosis not present

## 2021-01-04 DIAGNOSIS — M25531 Pain in right wrist: Secondary | ICD-10-CM | POA: Diagnosis not present

## 2021-01-04 DIAGNOSIS — M25641 Stiffness of right hand, not elsewhere classified: Secondary | ICD-10-CM | POA: Diagnosis not present

## 2021-01-04 DIAGNOSIS — M25631 Stiffness of right wrist, not elsewhere classified: Secondary | ICD-10-CM | POA: Diagnosis not present

## 2021-01-04 DIAGNOSIS — R209 Unspecified disturbances of skin sensation: Secondary | ICD-10-CM | POA: Diagnosis not present

## 2021-01-06 DIAGNOSIS — M810 Age-related osteoporosis without current pathological fracture: Secondary | ICD-10-CM | POA: Diagnosis not present

## 2021-01-09 ENCOUNTER — Other Ambulatory Visit (HOSPITAL_COMMUNITY): Payer: Self-pay | Admitting: Cardiovascular Disease

## 2021-01-09 ENCOUNTER — Encounter (HOSPITAL_COMMUNITY): Payer: Medicare HMO

## 2021-01-09 DIAGNOSIS — Z95828 Presence of other vascular implants and grafts: Secondary | ICD-10-CM

## 2021-01-09 DIAGNOSIS — I739 Peripheral vascular disease, unspecified: Secondary | ICD-10-CM

## 2021-01-16 DIAGNOSIS — M25541 Pain in joints of right hand: Secondary | ICD-10-CM | POA: Diagnosis not present

## 2021-01-16 DIAGNOSIS — E119 Type 2 diabetes mellitus without complications: Secondary | ICD-10-CM | POA: Diagnosis not present

## 2021-01-16 DIAGNOSIS — R209 Unspecified disturbances of skin sensation: Secondary | ICD-10-CM | POA: Diagnosis not present

## 2021-01-16 DIAGNOSIS — M25531 Pain in right wrist: Secondary | ICD-10-CM | POA: Diagnosis not present

## 2021-01-16 DIAGNOSIS — M6281 Muscle weakness (generalized): Secondary | ICD-10-CM | POA: Diagnosis not present

## 2021-01-16 DIAGNOSIS — W19XXXS Unspecified fall, sequela: Secondary | ICD-10-CM | POA: Diagnosis not present

## 2021-01-16 DIAGNOSIS — M25631 Stiffness of right wrist, not elsewhere classified: Secondary | ICD-10-CM | POA: Diagnosis not present

## 2021-01-16 DIAGNOSIS — M25641 Stiffness of right hand, not elsewhere classified: Secondary | ICD-10-CM | POA: Diagnosis not present

## 2021-01-17 ENCOUNTER — Ambulatory Visit (HOSPITAL_COMMUNITY)
Admission: RE | Admit: 2021-01-17 | Discharge: 2021-01-17 | Disposition: A | Discharge status: Home / Self Care | Payer: Medicare HMO | Source: Ambulatory Visit | Attending: Cardiology | Admitting: Cardiology

## 2021-01-17 ENCOUNTER — Ambulatory Visit (HOSPITAL_BASED_OUTPATIENT_CLINIC_OR_DEPARTMENT_OTHER)
Admission: RE | Admit: 2021-01-17 | Discharge: 2021-01-17 | Disposition: A | Discharge status: Home / Self Care | Payer: Medicare HMO | Source: Ambulatory Visit | Attending: Cardiology | Admitting: Cardiology

## 2021-01-17 ENCOUNTER — Other Ambulatory Visit: Payer: Self-pay

## 2021-01-17 DIAGNOSIS — Z95828 Presence of other vascular implants and grafts: Secondary | ICD-10-CM | POA: Insufficient documentation

## 2021-01-17 DIAGNOSIS — I739 Peripheral vascular disease, unspecified: Secondary | ICD-10-CM

## 2021-01-18 ENCOUNTER — Encounter: Payer: Self-pay | Admitting: *Deleted

## 2021-01-18 DIAGNOSIS — M25641 Stiffness of right hand, not elsewhere classified: Secondary | ICD-10-CM | POA: Diagnosis not present

## 2021-01-18 DIAGNOSIS — M25531 Pain in right wrist: Secondary | ICD-10-CM | POA: Diagnosis not present

## 2021-01-18 DIAGNOSIS — M25631 Stiffness of right wrist, not elsewhere classified: Secondary | ICD-10-CM | POA: Diagnosis not present

## 2021-01-18 DIAGNOSIS — M25541 Pain in joints of right hand: Secondary | ICD-10-CM | POA: Diagnosis not present

## 2021-01-18 DIAGNOSIS — E119 Type 2 diabetes mellitus without complications: Secondary | ICD-10-CM | POA: Diagnosis not present

## 2021-01-18 DIAGNOSIS — R209 Unspecified disturbances of skin sensation: Secondary | ICD-10-CM | POA: Diagnosis not present

## 2021-01-18 DIAGNOSIS — W19XXXS Unspecified fall, sequela: Secondary | ICD-10-CM | POA: Diagnosis not present

## 2021-01-18 DIAGNOSIS — M6281 Muscle weakness (generalized): Secondary | ICD-10-CM | POA: Diagnosis not present

## 2021-01-19 DIAGNOSIS — H30033 Focal chorioretinal inflammation, peripheral, bilateral: Secondary | ICD-10-CM | POA: Diagnosis not present

## 2021-01-19 DIAGNOSIS — E1040 Type 1 diabetes mellitus with diabetic neuropathy, unspecified: Secondary | ICD-10-CM | POA: Diagnosis not present

## 2021-01-19 DIAGNOSIS — Z4681 Encounter for fitting and adjustment of insulin pump: Secondary | ICD-10-CM | POA: Diagnosis not present

## 2021-01-19 DIAGNOSIS — Z79899 Other long term (current) drug therapy: Secondary | ICD-10-CM | POA: Diagnosis not present

## 2021-01-19 DIAGNOSIS — Z794 Long term (current) use of insulin: Secondary | ICD-10-CM | POA: Diagnosis not present

## 2021-01-19 DIAGNOSIS — I10 Essential (primary) hypertension: Secondary | ICD-10-CM | POA: Diagnosis not present

## 2021-01-19 DIAGNOSIS — E785 Hyperlipidemia, unspecified: Secondary | ICD-10-CM | POA: Diagnosis not present

## 2021-01-20 DIAGNOSIS — M810 Age-related osteoporosis without current pathological fracture: Secondary | ICD-10-CM | POA: Diagnosis not present

## 2021-01-31 DIAGNOSIS — M25631 Stiffness of right wrist, not elsewhere classified: Secondary | ICD-10-CM | POA: Diagnosis not present

## 2021-01-31 DIAGNOSIS — R209 Unspecified disturbances of skin sensation: Secondary | ICD-10-CM | POA: Diagnosis not present

## 2021-01-31 DIAGNOSIS — M6281 Muscle weakness (generalized): Secondary | ICD-10-CM | POA: Diagnosis not present

## 2021-01-31 DIAGNOSIS — W19XXXS Unspecified fall, sequela: Secondary | ICD-10-CM | POA: Diagnosis not present

## 2021-01-31 DIAGNOSIS — M25641 Stiffness of right hand, not elsewhere classified: Secondary | ICD-10-CM | POA: Diagnosis not present

## 2021-01-31 DIAGNOSIS — M25541 Pain in joints of right hand: Secondary | ICD-10-CM | POA: Diagnosis not present

## 2021-01-31 DIAGNOSIS — E119 Type 2 diabetes mellitus without complications: Secondary | ICD-10-CM | POA: Diagnosis not present

## 2021-01-31 DIAGNOSIS — M25531 Pain in right wrist: Secondary | ICD-10-CM | POA: Diagnosis not present

## 2021-02-01 ENCOUNTER — Encounter: Payer: Self-pay | Admitting: Cardiovascular Disease

## 2021-02-01 ENCOUNTER — Ambulatory Visit: Payer: Medicare HMO | Admitting: Cardiovascular Disease

## 2021-02-01 ENCOUNTER — Other Ambulatory Visit: Payer: Self-pay

## 2021-02-01 VITALS — BP 150/72 | HR 90 | Ht 59.0 in | Wt 167.8 lb

## 2021-02-01 DIAGNOSIS — Z720 Tobacco use: Secondary | ICD-10-CM | POA: Diagnosis not present

## 2021-02-01 DIAGNOSIS — I739 Peripheral vascular disease, unspecified: Secondary | ICD-10-CM | POA: Diagnosis not present

## 2021-02-01 DIAGNOSIS — E782 Mixed hyperlipidemia: Secondary | ICD-10-CM

## 2021-02-01 DIAGNOSIS — I10 Essential (primary) hypertension: Secondary | ICD-10-CM | POA: Diagnosis not present

## 2021-02-01 NOTE — Assessment & Plan Note (Signed)
Discontinued

## 2021-02-01 NOTE — Assessment & Plan Note (Addendum)
History of essential hypertension blood pressure measured today 150/72.  She is on metoprolol.

## 2021-02-01 NOTE — Progress Notes (Signed)
02/01/2021 [name]   [birth year]  [id number]  Primary Physician Prince Solian, MD Primary Cardiologist: Lorretta Harp MD [name], [place]  HPI:  Ashley [name] is a 55 y.o.  moderately overweight married Caucasian female mother of 1 son, grandmother to one grandchild referred by Dr. Paulla Dolly for peripheral vascular valuation because of poorly palpable pulses and leg pain.  I last saw her in the office 02/24/2020.  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  She has a history of ongoing tobacco abuse of 4 cigarettes a day, treated hypertension, diabetes and hyperlipidemia.  Her father died of a myocardial infarction at age 2.  She is never had a heart attack or stroke and denies chest pain or shortness of breath.  She has been on insulin pump for last 26 years.  She has had bilateral lower extremity lifestyle and claudication for last 2 years prior to her intervention treated as orthopedic or neurovascular pain, with recent lower extremity Dopplers performed in our office 12/16/2018 revealing a right ABI 0.64 and a left 0.55.  She had high-grade bilateral iliac disease.   I performed peripheral angiography on her 01/01/2019 revealing high-grade distal abdominal aortic stenosis.  I performed bilateral PTA and covered stenting using "kissing stent technique and rebuilding her aortic bifurcation.  Her follow-up Dopplers normalized and her claudication has resolved.  She does have some swelling in her lower extremities potentially related to the increased blood flow as result of her intervention.  I am going to put her on low-dose diuretic and decrease her amlodipine dose.  She remains on dual antiplatelet therapy.    Since I saw her a year ago she continues to do well.  She was hospitalized in January of this year with COVID-pneumonia and was on the ventilator for 9 days.  She has since stopped smoking.  She is also gained close to 20 pounds  for unclear reasons.  She complains of some back and left leg pain however lower extremity arterial Doppler studies have remained fairly stable.  Current Meds  Medication Sig   ACCU-CHEK COMPACT PLUS test strip 1 each by Other route as needed.    ACCU-CHEK SOFTCLIX LANCETS lancets    albuterol (PROVENTIL) (2.5 MG/3ML) 0.083% nebulizer solution Take 2.5 mg by nebulization every 4 (four) hours as needed for wheezing or shortness of breath.    Albuterol Sulfate, sensor, (PROAIR DIGIHALER) 108 (90 Base) MCG/ACT AEPB Inhale into the lungs as needed.   ALPRAZolam (XANAX) 0.5 MG tablet Take 0.5-1 mg by mouth 3 (three) times daily as needed for anxiety.   AMBULATORY NON FORMULARY MEDICATION NOVOLOG INSULIN PUMP USES AS DIRECTED   Blood Glucose Monitoring Suppl (ACCU-CHEK AVIVA PLUS) w/Device KIT    busPIRone (BUSPAR) 15 MG tablet Take 15 mg by mouth 2 (two) times daily.   clopidogrel (PLAVIX) 75 MG tablet TAKE 1 TABLET EVERY DAY WITH BREAKFAST (Patient taking differently: Take 75 mg by mouth daily.)   DULoxetine (CYMBALTA) 30 MG capsule Take 30 mg by mouth every morning. Takes with 60 mg to total 90 mg   estradiol (ESTRACE) 0.5 MG tablet Take 0.5 mg by mouth daily.   famotidine (PEPCID) 40 MG tablet TAKE 1 TABLET AT BEDTIME   folic acid (FOLVITE) 1 MG tablet Take 1 tablet by mouth daily.   Insulin Human (INSULIN PUMP) SOLN Inject into the skin. Novolog----  basal rate--- 12am to 2 am =  1.3units                                          2am  to  6am  = 1.4units                                          6am  to  ?   lansoprazole (PREVACID) 30 MG capsule Take 1 capsule (30 mg total) by mouth 2 (two) times daily before a meal.   latanoprost (XALATAN) 0.005 % ophthalmic solution Place 1 drop into both eyes at bedtime.   levocetirizine (XYZAL) 5 MG tablet Take 5 mg by mouth every morning.    levothyroxine (SYNTHROID) 125 MCG tablet Take 125 mcg by mouth daily before breakfast.   methotrexate (RHEUMATREX)  2.5 MG tablet Take 15 mg by mouth once a week. 3 tablets am and at bedtime twice a day once a week,  Saturday's   montelukast (SINGULAIR) 10 MG tablet Take 10 mg by mouth at bedtime.   NOVOLOG 100 UNIT/ML injection Inject into the skin See admin instructions. Pt has Insulin pump   pregabalin (LYRICA) 75 MG capsule Take 75-150 mg by mouth 3 (three) times daily. Takes one cap in am and lunch, two cap at bedtime   rosuvastatin (CRESTOR) 20 MG tablet Take 20 mg by mouth at bedtime.   Vitamin D, Ergocalciferol, (DRISDOL) 50000 UNITS CAPS Take 50,000 Units by mouth every 7 (seven) days. Saturday's     Allergies  Allergen Reactions   Mirtazapine Hives and Rash   Clindamycin Hives   Other Hives    Tylox    Sulfonamide Derivatives Hives   Lipitor [Atorvastatin] Other (See Comments)    MUSCLE ACHES   Restasis [Cyclosporine]     Pt stated, "My eyes turned red on the inside and outside of eye; burning sensation"   Topamax [Topiramate] Itching    Social History   Socioeconomic History   Marital status: Married    Spouse name: Not on file   Number of children: 1   Years of education: Not on file   Highest education level: Not on file  Occupational History   Not on file  Tobacco Use   Smoking status: Former    Packs/day: 0.50    Years: 25.00    Pack years: 12.50    Types: Cigarettes    Start date: 52    Quit date: 06/20/2020    Years since quitting: 0.6   Smokeless tobacco: Never   Tobacco comments:    Started smoking at age [age]  Vaping Use   Vaping Use: Never used  Substance and Sexual Activity   Alcohol use: No    Alcohol/week: 0.0 standard drinks   Drug use: No   Sexual activity: Not on file  Other Topics Concern   Not on file  Social History Narrative   Lives home with husband.  Education 12th grade.  Caffeine 4-5 drinks daily.   Social Determinants of Health   Financial Resource Strain: Not on file  Food Insecurity: Not on file  Transportation Needs: Not on file   Physical Activity: Not on file  Stress: Not on file  Social Connections: Not on file  Intimate Partner Violence: Not on file     Review of Systems: General: negative  for chills, fever, night sweats or weight changes.  Cardiovascular: negative for chest pain, dyspnea on exertion, edema, orthopnea, palpitations, paroxysmal nocturnal dyspnea or shortness of breath Dermatological: negative for rash Respiratory: negative for cough or wheezing Urologic: negative for hematuria Abdominal: negative for nausea, vomiting, diarrhea, bright red blood per rectum, melena, or hematemesis Neurologic: negative for visual changes, syncope, or dizziness All other systems reviewed and are otherwise negative except as noted above.    Blood pressure (!) 150/72, pulse 90, height _0  (1.499 m), weight 167 lb 12.8 oz (76.1 kg), SpO2 97 %.  General appearance: alert and no distress Neck: no adenopathy, no carotid bruit, no JVD, supple, symmetrical, trachea midline, and thyroid not enlarged, symmetric, no tenderness/mass/nodules Lungs: clear to auscultation bilaterally Heart: regular rate and rhythm, S1, S2 normal, no murmur, click, rub or gallop Extremities: extremities normal, atraumatic, no cyanosis or edema Pulses: 2+ and symmetric Skin: Skin color, texture, turgor normal. No rashes or lesions Neurologic: Grossly normal  EKG not performed today  ASSESSMENT AND PLAN:   Essential hypertension History of essential hypertension blood pressure measured today 150/72.  She is on metoprolol.  Hyperlipidemia History of hyperlipidemia on statin therapy with lipid profile performed 09/22/2019 revealing total cholesterol of 107, LDL of 39 HDL 41.  This is followed by her PCP.  Peripheral arterial disease (HCC) History of peripheral arterial disease with high-grade distal abdominal aortic and bilateral ostial iliac stenoses status post PTA and covered stenting using "kissing stent technique by myself 01/01/2019  with an excellent result.  Report post procedure Dopplers markedly improved and her claudication resolved at that time.  Her Dopplers have remained fairly stable although she has had recurrent back and left lower extremity discomfort which she says are similar to her prior symptoms.  We will continue to follow her noninvasively.  Tobacco abuse Discontinued     Lorretta Harp MD [facility], [facility] 02/01/2021 2:25 PM

## 2021-02-01 NOTE — Assessment & Plan Note (Signed)
History of peripheral arterial disease with high-grade distal abdominal aortic and bilateral ostial iliac stenoses status post PTA and covered stenting using "kissing stent technique by myself 01/01/2019 with an excellent result.  Report post procedure Dopplers markedly improved and her claudication resolved at that time.  Her Dopplers have remained fairly stable although she has had recurrent back and left lower extremity discomfort which she says are similar to her prior symptoms.  We will continue to follow her noninvasively.

## 2021-02-01 NOTE — Assessment & Plan Note (Signed)
History of hyperlipidemia on statin therapy with lipid profile performed 09/22/2019 revealing total cholesterol of 107, LDL of 39 HDL 41.  This is followed by her PCP.

## 2021-02-01 NOTE — Patient Instructions (Signed)
Medication Instructions:  The current medical regimen is effective;  continue present plan and medications.  *If you need a refill on your cardiac medications before your next appointment, please call your pharmacy*   Lab Work: Have your primary care doctor send over blood work results.   If you have labs (blood work) drawn today and your tests are completely normal, you will receive your results only by: [place] (if you have MyChart) OR A paper copy in the mail If you have any lab test that is abnormal or we need to change your treatment, we will call you to review the results.   Testing/Procedures: AORTA/IVC/ILIACS in 12 months  Your physician has requested that you have a lower or upper extremity arterial duplex (in 12 months). This test is an ultrasound of the arteries in the legs or arms. It looks at arterial blood flow in the legs and arms. Allow one hour for Lower and Upper Arterial scans. There are no restrictions or special instructions    Follow-Up: At [facility], you and your health needs are our priority.  As part of our continuing mission to provide you with exceptional heart care, we have created designated Provider Care Teams.  These Care Teams include your primary Cardiologist (physician) and Advanced Practice Providers (APPs -  Physician Assistants and Nurse Practitioners) who all work together to provide you with the care you need, when you need it.  We recommend signing up for the patient portal called "MyChart".  Sign up information is provided on this After Visit Summary.  MyChart is used to connect with patients for Virtual Visits (Telemedicine).  Patients are able to view lab/test results, encounter notes, upcoming appointments, etc.  Non-urgent messages can be sent to your provider as well.   To learn more about what you can do with MyChart, go to [email or web address].    Your next appointment:   12 month(s)  The format for your next appointment:    In Person  Provider:   Quay Burow, MD

## 2021-02-02 ENCOUNTER — Ambulatory Visit (INDEPENDENT_AMBULATORY_CARE_PROVIDER_SITE_OTHER): Payer: Medicare HMO | Admitting: Adult Health

## 2021-02-02 ENCOUNTER — Ambulatory Visit (INDEPENDENT_AMBULATORY_CARE_PROVIDER_SITE_OTHER): Payer: Medicare HMO | Admitting: Pulmonary Disease

## 2021-02-02 ENCOUNTER — Encounter: Payer: Self-pay | Admitting: Adult Health

## 2021-02-02 DIAGNOSIS — M25541 Pain in joints of right hand: Secondary | ICD-10-CM | POA: Diagnosis not present

## 2021-02-02 DIAGNOSIS — E119 Type 2 diabetes mellitus without complications: Secondary | ICD-10-CM | POA: Diagnosis not present

## 2021-02-02 DIAGNOSIS — M25631 Stiffness of right wrist, not elsewhere classified: Secondary | ICD-10-CM | POA: Diagnosis not present

## 2021-02-02 DIAGNOSIS — J4540 Moderate persistent asthma, uncomplicated: Secondary | ICD-10-CM

## 2021-02-02 DIAGNOSIS — R209 Unspecified disturbances of skin sensation: Secondary | ICD-10-CM | POA: Diagnosis not present

## 2021-02-02 DIAGNOSIS — Z87891 Personal history of nicotine dependence: Secondary | ICD-10-CM

## 2021-02-02 DIAGNOSIS — M6281 Muscle weakness (generalized): Secondary | ICD-10-CM | POA: Diagnosis not present

## 2021-02-02 DIAGNOSIS — M25641 Stiffness of right hand, not elsewhere classified: Secondary | ICD-10-CM | POA: Diagnosis not present

## 2021-02-02 DIAGNOSIS — M25531 Pain in right wrist: Secondary | ICD-10-CM | POA: Diagnosis not present

## 2021-02-02 DIAGNOSIS — U071 COVID-19: Secondary | ICD-10-CM

## 2021-02-02 DIAGNOSIS — R0602 Shortness of breath: Secondary | ICD-10-CM

## 2021-02-02 DIAGNOSIS — J1282 Pneumonia due to coronavirus disease 2019: Secondary | ICD-10-CM

## 2021-02-02 DIAGNOSIS — W19XXXS Unspecified fall, sequela: Secondary | ICD-10-CM | POA: Diagnosis not present

## 2021-02-02 LAB — PULMONARY FUNCTION TEST
DL/VA % pred: 94 %
DL/VA: 4.14 ml/min/mmHg/L
DLCO cor % pred: 82 %
DLCO cor: 14.84 ml/min/mmHg
DLCO unc % pred: 80 %
DLCO unc: 14.42 ml/min/mmHg
FEF 25-75 Post: 2.35 L/sec
FEF 25-75 Pre: 2.41 L/sec
FEF2575-%Change-Post: -2 %
FEF2575-%Pred-Post: 100 %
FEF2575-%Pred-Pre: 103 %
FEV1-%Change-Post: 1 %
FEV1-%Pred-Post: 83 %
FEV1-%Pred-Pre: 82 %
FEV1-Post: 1.94 L
FEV1-Pre: 1.91 L
FEV1FVC-%Change-Post: 3 %
FEV1FVC-%Pred-Pre: 107 %
FEV6-%Change-Post: -1 %
FEV6-%Pred-Post: 77 %
FEV6-%Pred-Pre: 78 %
FEV6-Post: 2.22 L
FEV6-Pre: 2.25 L
FEV6FVC-%Pred-Post: 103 %
FEV6FVC-%Pred-Pre: 103 %
FVC-%Change-Post: -1 %
FVC-%Pred-Post: 75 %
FVC-%Pred-Pre: 76 %
FVC-Post: 2.22 L
FVC-Pre: 2.25 L
Post FEV1/FVC ratio: 87 %
Post FEV6/FVC ratio: 100 %
Pre FEV1/FVC ratio: 85 %
Pre FEV6/FVC Ratio: 100 %
RV % pred: 81 %
RV: 1.37 L
TLC % pred: 87 %
TLC: 3.89 L

## 2021-02-02 MED ORDER — FLUTICASONE FUROATE-VILANTEROL 100-25 MCG/INH IN AEPB: 1.0000 | INHALATION_SPRAY | Freq: Every day | RESPIRATORY_TRACT | 0 refills | Status: DC

## 2021-02-02 NOTE — Patient Instructions (Addendum)
Remain off Stiolto and Pulmicort  Begin BREO 1 puff daily, rinse after use.  Albuterol As needed   Activity as tolerated.  LDCT chest as planned this month  Follow up with Dr. Valeta Harms in 4 months and As needed

## 2021-02-02 NOTE — Progress Notes (Signed)
'@Patient'  ID: Ashley [name], female    DOB: 03-May-1966, 55 y.o.   MRN: [id number]  Chief Complaint  Patient presents with   Follow-up    Referring provider: Prince Solian, MD  HPI: 55 year old female former smoker and seen for pulmonary consult December 22, 2020 for shortness of breath.  Patient has severe VVOHY-07 infection complicated by bilateral pneumonia January 2022 requiring hospitalization this was complicated by acute respiratory failure requiring intubation and mechanical ventilatory support.  She had a subsequent long recovery.  Required prolonged oxygen use with eventual discontinuation of oxygen. Medical history significant for diabetes, rheumatoid arthritis   TEST/EVENTS :   02/02/2021 Follow up : COPD  Patient presents for a 6-week follow-up.  Patient was seen last visit for pulmonary consult.  She is carried a previous diagnosis of COPD.  She also had severe PXTGG-26 infection complicated by bilateral pneumonia and acute respiratory failure requiring intubation and vent support in January 2022.  Last visit patient was recommended to begin [place].  Patient says she was unable to tolerate this.  She resumed going back to her Pulmicort.  She only uses this on occasion. She was set up for pulmonary function testing that showed mild restriction with no airflow obstruction FEV1 83%, ratio 87, FVC 75%, no significant bronchodilator response, DLCO 80%.  Chest x-ray was clear.  Patient has been referred for the low-dose CT screening program.  She has an upcoming CT later this month. Patient says she does have shortness of breath with some activities.  Has some intermittent cough.  Uses her albuterol on occasion.   Allergies  Allergen Reactions   Mirtazapine Hives and Rash   Clindamycin Hives   Other Hives    Tylox    Sulfonamide Derivatives Hives   Lipitor [Atorvastatin] Other (See Comments)    MUSCLE ACHES   Restasis [Cyclosporine]     Pt stated, "My eyes turned red on the  inside and outside of eye; burning sensation"   Topamax [Topiramate] Itching    Immunization History  Administered Date(s) Administered   Moderna Sars-Covid-2 Vaccination 05/03/2020, 05/19/2020    Past Medical History:  Diagnosis Date   Anxiety    COPD (chronic obstructive pulmonary disease) (HCC)    Depression    DOE (dyspnea on exertion)    Gastroparesis    GERD (gastroesophageal reflux disease)    Glaucoma, both eyes    History of 2019 novel coronavirus disease (COVID-19) 06/20/2020   hospital admission-- dx severe covid with pneumonia, ARDS, DKA, COPD exaberation;  pt intubated 01-13th and extubated 01-20th;   (11-15-2020  per pt did not go home on oxygen, residual generalized weakness and hair loss)   History of esophageal stricture    post dilatation, followed by dr Henrene Pastor   History of Graves' disease    s/p RAI  [id number]   History of Helicobacter pylori infection    remote hx and treatment   History of kidney stones    Hyperlipidemia    Hypertension    followed by pcp   Hypothyroidism, postradioiodine therapy 02/2010   followed by pcp   IDA (iron deficiency anemia)    Insulin pump in place    w/ Novolog , managed by pcp   Moderate asthma    followed by pcp,  (11-15-2020  per pt has pulmonology appt on 11-17-2020))   OSA (obstructive sleep apnea)    11-15-2020  per pt has not used cpap since 05/ 2021  (previous followed by dr dohmeier,  study  in epic 05-12-2018 moderate complex osa pt had used for awhile)   Osteoporosis    PAD (peripheral artery disease) ([place])    followed by dr berry----  s/p bilateral common iliac artery angioplasty stenting for stenosis   Peripheral neuropathy    PONV (postoperative nausea and vomiting)    Right wrist fracture    Type 1 diabetes mellitus with long-term current use of insulin ([place])    per pt dx at age 19 approx,  followed by pcp,  (11-15-2020  per pt last A1c 7.2 in 04/ 2022),  checks blood sugar 6 to 10 times daily , fasting  sugar-- 200 -- 300)   Vitamin D deficiency     Tobacco History: Social History   Tobacco Use  Smoking Status Former   Packs/day: 0.50   Years: 25.00   Pack years: 12.50   Types: Cigarettes   Start date: 47   Quit date: 06/20/2020   Years since quitting: 0.6  Smokeless Tobacco Never  Tobacco Comments   Started smoking at age [age]   Counseling given: Not Answered Tobacco comments: Started smoking at age [age]   Outpatient Medications Prior to Visit  Medication Sig Dispense Refill   ACCU-CHEK COMPACT PLUS test strip 1 each by Other route as needed.      ACCU-CHEK SOFTCLIX LANCETS lancets      albuterol (PROVENTIL) (2.5 MG/3ML) 0.083% nebulizer solution Take 2.5 mg by nebulization every 4 (four) hours as needed for wheezing or shortness of breath.   3   Albuterol Sulfate, sensor, (PROAIR DIGIHALER) 108 (90 Base) MCG/ACT AEPB Inhale into the lungs as needed.     ALPRAZolam (XANAX) 0.5 MG tablet Take 0.5-1 mg by mouth 3 (three) times daily as needed for anxiety.     AMBULATORY NON FORMULARY MEDICATION NOVOLOG INSULIN PUMP USES AS DIRECTED     Blood Glucose Monitoring Suppl (ACCU-CHEK AVIVA PLUS) w/Device KIT      busPIRone (BUSPAR) 15 MG tablet Take 15 mg by mouth 2 (two) times daily.     clopidogrel (PLAVIX) 75 MG tablet TAKE 1 TABLET EVERY DAY WITH BREAKFAST (Patient taking differently: Take 75 mg by mouth daily.) 90 tablet 3   DULoxetine (CYMBALTA) 30 MG capsule Take 30 mg by mouth every morning. Takes with 60 mg to total 90 mg  1   estradiol (ESTRACE) 0.5 MG tablet Take 0.5 mg by mouth daily.     famotidine (PEPCID) 40 MG tablet TAKE 1 TABLET AT BEDTIME 90 tablet 1   folic acid (FOLVITE) 1 MG tablet Take 1 tablet by mouth daily.     Insulin Human (INSULIN PUMP) SOLN Inject into the skin. Novolog----  basal rate--- 12am to 2 am = 1.3units                                          2am  to  6am  = 1.4units                                          6am  to  ?     lansoprazole  (PREVACID) 30 MG capsule Take 1 capsule (30 mg total) by mouth 2 (two) times daily before a meal. 180 capsule 1   latanoprost (XALATAN) 0.005 % ophthalmic solution Place 1 drop into  both eyes at bedtime.  11   levocetirizine (XYZAL) 5 MG tablet Take 5 mg by mouth every morning.      levothyroxine (SYNTHROID) 125 MCG tablet Take 125 mcg by mouth daily before breakfast.     methotrexate (RHEUMATREX) 2.5 MG tablet Take 15 mg by mouth once a week. 3 tablets am and at bedtime twice a day once a week,  Saturday's     montelukast (SINGULAIR) 10 MG tablet Take 10 mg by mouth at bedtime.     NOVOLOG 100 UNIT/ML injection Inject into the skin See admin instructions. Pt has Insulin pump     pregabalin (LYRICA) 75 MG capsule Take 75-150 mg by mouth 3 (three) times daily. Takes one cap in am and lunch, two cap at bedtime     rosuvastatin (CRESTOR) 20 MG tablet Take 20 mg by mouth at bedtime.     Vitamin D, Ergocalciferol, (DRISDOL) 50000 UNITS CAPS Take 50,000 Units by mouth every 7 (seven) days. Saturday's     ferrous sulfate 325 (65 FE) MG EC tablet Take 325 mg by mouth at bedtime. (Patient not taking: Reported on 02/01/2021)     Tiotropium Bromide-Olodaterol 2.5-2.5 MCG/ACT AERS Inhale 2 puffs into the lungs daily. (Patient not taking: Reported on 02/01/2021) 2 each 0   No facility-administered medications prior to visit.     Review of Systems:   Constitutional:   No  weight loss, night sweats,  Fevers, chills, f +fatigue, or  lassitude.  HEENT:   No headaches,  Difficulty swallowing,  Tooth/dental problems, or  Sore throat,                No sneezing, itching, ear ache, nasal congestion, post nasal drip,   CV:  No chest pain,  Orthopnea, PND, swelling in lower extremities, anasarca, dizziness, palpitations, syncope.   GI  No heartburn, indigestion, abdominal pain, nausea, vomiting, diarrhea, change in bowel habits, loss of appetite, bloody stools.   Resp: .  No chest wall deformity  Skin: no rash  or lesions.  GU: no dysuria, change in color of urine, no urgency or frequency.  No flank pain, no hematuria   MS:  No joint pain or swelling.  No decreased range of motion.  No back pain.    Physical Exam  BP 140/86 (BP Location: Left Arm, Patient Position: Sitting, Cuff Size: Normal)   Pulse 94   Temp 97.9 F (36.6 C) (Oral)   Ht 5' (1.524 m)   Wt 168 lb 12.8 oz (76.6 kg)   SpO2 97%   BMI 32.97 kg/m   GEN: A/Ox3; pleasant , NAD, well nourished    HEENT:  [place]/AT,  , NOSE-clear, THROAT-clear, no lesions, no postnasal drip or exudate noted.   NECK:  Supple w/ fair ROM; no JVD; normal carotid impulses w/o bruits; no thyromegaly or nodules palpated; no lymphadenopathy.    RESP  Clear  P & A; w/o, wheezes/ rales/ or rhonchi. no accessory muscle use, no dullness to percussion  CARD:  RRR, no m/r/g, no peripheral edema, pulses intact, no cyanosis or clubbing.  GI:   Soft & nt; nml bowel sounds; no organomegaly or masses detected.   Musco: Warm bil, no deformities or joint swelling noted.   Neuro: alert, no focal deficits noted.    Skin: Warm, no lesions or rashes    Lab Results:     ProBNP No results found for: PROBNP  Imaging:     PFT Results Latest Ref Rng & Units  02/02/2021  FVC-Pre L 2.25  FVC-Predicted Pre % 76  FVC-Post L 2.22  FVC-Predicted Post % 75  Pre FEV1/FVC % % 85  Post FEV1/FCV % % 87  FEV1-Pre L 1.91  FEV1-Predicted Pre % 82  FEV1-Post L 1.94  DLCO uncorrected ml/min/mmHg 14.42  DLCO UNC% % 80  DLCO corrected ml/min/mmHg 14.84  DLCO COR %Predicted % 82  DLVA Predicted % 94  TLC L 3.89  TLC % Predicted % 87  RV % Predicted % 81    Lab Results  Component Value Date   NITRICOXIDE 15 04/26/2015        Assessment & Plan:   No problem-specific Assessment & Plan notes found for this encounter.     Rexene Edison, NP 02/02/2021

## 2021-02-02 NOTE — Progress Notes (Signed)
PFT done today. 

## 2021-02-02 NOTE — Progress Notes (Signed)
Patient seen in the office today and instructed on use of Breo.  Patient expressed understanding and demonstrated technique.  

## 2021-02-03 NOTE — Assessment & Plan Note (Signed)
Patient has a history of former smoking.  Pulmonary function testing no shows no significant airflow obstruction.  She does have some restriction which should could be indicative of some underlying asthma.  We will try a empiric course of Breo.  Plan  Patient Instructions  Remain off Stiolto and Pulmicort  Begin BREO 1 puff daily, rinse after use.  Albuterol As needed   Activity as tolerated.  LDCT chest as planned this month  Follow up with Dr. Valeta Harms in 4 months and As needed

## 2021-02-03 NOTE — Assessment & Plan Note (Signed)
Severe bilateral pneumonia with COVID-19 infection January 2022.  Follow-up chest x-ray shows clear lungs.  PFTs show no significant airflow obstruction.  And normal DLCO.  She has an upcoming CT chest part of the low-dose CT screening program.  We will further evaluate on these images.  But does not appear to have any residual scarring at this time

## 2021-02-07 DIAGNOSIS — S52541D Smith's fracture of right radius, subsequent encounter for closed fracture with routine healing: Secondary | ICD-10-CM | POA: Diagnosis not present

## 2021-02-08 ENCOUNTER — Encounter: Payer: Self-pay | Admitting: Acute Care

## 2021-02-08 ENCOUNTER — Telehealth (INDEPENDENT_AMBULATORY_CARE_PROVIDER_SITE_OTHER): Payer: Medicare HMO | Admitting: Acute Care

## 2021-02-08 ENCOUNTER — Other Ambulatory Visit: Payer: Self-pay

## 2021-02-08 ENCOUNTER — Ambulatory Visit
Admission: RE | Admit: 2021-02-08 | Discharge: 2021-02-08 | Disposition: A | Discharge status: Home / Self Care | Payer: Medicare HMO | Source: Ambulatory Visit | Attending: Acute Care | Admitting: Acute Care

## 2021-02-08 DIAGNOSIS — N200 Calculus of kidney: Secondary | ICD-10-CM | POA: Diagnosis not present

## 2021-02-08 DIAGNOSIS — Z87891 Personal history of nicotine dependence: Secondary | ICD-10-CM

## 2021-02-08 DIAGNOSIS — J432 Centrilobular emphysema: Secondary | ICD-10-CM | POA: Diagnosis not present

## 2021-02-08 DIAGNOSIS — I700 Atherosclerosis of aorta: Secondary | ICD-10-CM | POA: Diagnosis not present

## 2021-02-08 NOTE — Progress Notes (Signed)
Virtual Visit via Video Note  I connected with Ashley [name] on 02/08/21 at 10:30 AM EDT by a video enabled telemedicine application and verified that I am speaking with the correct person using two identifiers.  Location: Patient: At home Provider: [place], [place], [place], [address]    I discussed the limitations of evaluation and management by telemedicine and the availability of in person appointments. The patient expressed understanding and agreed to proceed.  Shared Decision Making Visit Lung Cancer Screening Program [phone])   Eligibility: Age 55 y.o. Pack Years Smoking History Calculation 100 pack year smoking history (# packs/per year x # years smoked) Recent History of coughing up blood  no Unexplained weight loss? no ( >Than 15 pounds within the last 6 months ) Prior History Lung / other cancer no (Diagnosis within the last 5 years already requiring surveillance chest CT Scans). Smoking Status Former Smoker Former Smokers: Years since quit: < 1 year  Quit Date: 06/2020  Visit Components: Discussion included one or more decision making aids. yes Discussion included risk/benefits of screening. yes Discussion included potential follow up diagnostic testing for abnormal scans. yes Discussion included meaning and risk of over diagnosis. yes Discussion included meaning and risk of False Positives. yes Discussion included meaning of total radiation exposure. yes  Counseling Included: Importance of adherence to annual lung cancer LDCT screening. yes Impact of comorbidities on ability to participate in the program. yes Ability and willingness to under diagnostic treatment. yes  Smoking Cessation Counseling: Current Smokers:  Discussed importance of smoking cessation. yes Information about tobacco cessation classes and interventions provided to patient. yes Patient provided with "ticket" for LDCT Scan. yes Symptomatic Patient. no  Counseling NA Diagnosis  Code: Tobacco Use Z72.0 Asymptomatic Patient yes  Counseling (Intermediate counseling: > three minutes counseling) [id number] Former Smokers:  Discussed the importance of maintaining cigarette abstinence. yes Diagnosis Code: Personal History of Nicotine Dependence. [id number] Information about tobacco cessation classes and interventions provided to patient. Yes Patient provided with "ticket" for LDCT Scan. yes Written Order for Lung Cancer Screening with LDCT placed in Epic. Yes (CT Chest Lung Cancer Screening Low Dose W/O CM) [id number] Z12.2-Screening of respiratory organs Z87.891-Personal history of nicotine dependence  I spent 25 minutes of face to face time with [name] discussing the risks and benefits of lung cancer screening. We viewed a power point together that explained in detail the above noted topics. We took the time to pause the power point at intervals to allow for questions to be asked and answered to ensure understanding. We discussed that she had taken the single most powerful action possible to decrease her risk of developing lung cancer when she quit smoking. I counseled her to remain smoke free, and to contact me if she ever had the desire to smoke again so that I can provide resources and tools to help support the effort to remain smoke free. We discussed the time and location of the scan, and that either  Doroteo Glassman RN or I will call with the results within  24-48 hours of receiving them. She has my card and contact information in the event she needs to speak with me, in addition to a copy of the power point we reviewed as a resource. She verbalized understanding of all of the above and had no further questions upon leaving the office.     I explained to the patient that there has been a high incidence of coronary artery disease noted on  these exams. I explained that this is a non-gated exam therefore degree or severity cannot be determined. This patient is currently on statin  therapy. I have asked the patient to follow-up with their PCP regarding any incidental finding of coronary artery disease and management with diet or medication as they feel is clinically indicated. The patient verbalized understanding of the above and had no further questions.   Quit after being in the hospital with Covid 06/20/2020-07/15/2020.  Ashley Spatz, NP 02/08/2021

## 2021-02-08 NOTE — Patient Instructions (Signed)
Thank you for participating in the [facility]. It was our pleasure to meet you today. We will call you with the results of your scan within the next few days. Your scan will be assigned a Lung RADS category score by the physicians reading the scans.  This Lung RADS score determines follow up scanning.  See below for description of categories, and follow up screening recommendations. We will be in touch to schedule your follow up screening annually or based on recommendations of our providers. We will fax a copy of your scan results to your Primary Care Physician, or the physician who referred you to the program, to ensure they have the results. Please call the office if you have any questions or concerns regarding your scanning experience or results.  Our office number is [phone]. Please speak with Denise Phelps, RN. She is our Lung Cancer Screening RN. If she is unavailable when you call, please have the office staff send her a message. She will return your call at her earliest convenience. Remember, if your scan is normal, we will scan you annually as long as you continue to meet the criteria for the program. (Age 55-77, Current smoker or smoker who has quit within the last 15 years). If you are a smoker, remember, quitting is the single most powerful action that you can take to decrease your risk of lung cancer and other pulmonary, breathing related problems. We know quitting is hard, and we are here to help.  Please let us know if there is anything we can do to help you meet your goal of quitting. If you are a former smoker, congratulations. We are proud of you! Remain smoke free! Remember you can refer friends or family members through the number above.  We will screen them to make sure they meet criteria for the program. Thank you for helping us take better care of you by participating in Lung Screening.  Lung RADS Categories:  Lung RADS 1: no nodules  or definitely non-concerning nodules.  Recommendation is for a repeat annual scan in 12 months.  Lung RADS 2:  nodules that are non-concerning in appearance and behavior with a very low likelihood of becoming an active cancer. Recommendation is for a repeat annual scan in 12 months.  Lung RADS 3: nodules that are probably non-concerning , includes nodules with a low likelihood of becoming an active cancer.  Recommendation is for a 6-month repeat screening scan. Often noted after an upper respiratory illness. We will be in touch to make sure you have no questions, and to schedule your 6-month scan.  Lung RADS 4 A: nodules with concerning findings, recommendation is most often for a follow up scan in 3 months or additional testing based on our provider's assessment of the scan. We will be in touch to make sure you have no questions and to schedule the recommended 3 month follow up scan.  Lung RADS 4 B:  indicates findings that are concerning. We will be in touch with you to schedule additional diagnostic testing based on our provider's  assessment of the scan.   

## 2021-02-14 ENCOUNTER — Telehealth: Payer: Self-pay | Admitting: Adult Health

## 2021-02-14 DIAGNOSIS — M25541 Pain in joints of right hand: Secondary | ICD-10-CM | POA: Diagnosis not present

## 2021-02-14 DIAGNOSIS — M6281 Muscle weakness (generalized): Secondary | ICD-10-CM | POA: Diagnosis not present

## 2021-02-14 DIAGNOSIS — M25631 Stiffness of right wrist, not elsewhere classified: Secondary | ICD-10-CM | POA: Diagnosis not present

## 2021-02-14 DIAGNOSIS — E119 Type 2 diabetes mellitus without complications: Secondary | ICD-10-CM | POA: Diagnosis not present

## 2021-02-14 DIAGNOSIS — W19XXXS Unspecified fall, sequela: Secondary | ICD-10-CM | POA: Diagnosis not present

## 2021-02-14 DIAGNOSIS — M25641 Stiffness of right hand, not elsewhere classified: Secondary | ICD-10-CM | POA: Diagnosis not present

## 2021-02-14 DIAGNOSIS — M25531 Pain in right wrist: Secondary | ICD-10-CM | POA: Diagnosis not present

## 2021-02-14 DIAGNOSIS — R209 Unspecified disturbances of skin sensation: Secondary | ICD-10-CM | POA: Diagnosis not present

## 2021-02-14 MED ORDER — FLUTICASONE FUROATE-VILANTEROL 200-25 MCG/INH IN AEPB: 1.0000 | INHALATION_SPRAY | Freq: Every day | RESPIRATORY_TRACT | 5 refills | Status: DC

## 2021-02-14 NOTE — Telephone Encounter (Signed)
I called and spoke with patient regarding message on Breo 100. Patient stated that [facility] is helping but she is still having moments of where it is not enough and she is using her Albuterol. She is wanting to know if she needs to go higher to Breo 200? She wants whatever Tammy recommends sent to [facility] and may need a sample to hold her until pharmacy fills script.  Tammy, please advise, thanks!

## 2021-02-14 NOTE — Telephone Encounter (Signed)
Called spoke with patient Verified pharmacy  Gave her Tammy's recommendations.  Order placed Nothing further needed at this time.

## 2021-02-14 NOTE — Telephone Encounter (Signed)
That is fine to send in Breo 200 1  puff daily, rinse after use make sure she keeps her follow-up visit  Can send 5 refills of Breo.

## 2021-02-15 DIAGNOSIS — R102 Pelvic and perineal pain: Secondary | ICD-10-CM | POA: Diagnosis not present

## 2021-02-15 DIAGNOSIS — R140 Abdominal distension (gaseous): Secondary | ICD-10-CM | POA: Diagnosis not present

## 2021-02-16 ENCOUNTER — Telehealth: Payer: Self-pay | Admitting: Adult Health

## 2021-02-16 DIAGNOSIS — I10 Essential (primary) hypertension: Secondary | ICD-10-CM | POA: Diagnosis not present

## 2021-02-16 DIAGNOSIS — E1040 Type 1 diabetes mellitus with diabetic neuropathy, unspecified: Secondary | ICD-10-CM | POA: Diagnosis not present

## 2021-02-16 DIAGNOSIS — J9601 Acute respiratory failure with hypoxia: Secondary | ICD-10-CM | POA: Diagnosis not present

## 2021-02-16 DIAGNOSIS — M810 Age-related osteoporosis without current pathological fracture: Secondary | ICD-10-CM | POA: Diagnosis not present

## 2021-02-16 DIAGNOSIS — E785 Hyperlipidemia, unspecified: Secondary | ICD-10-CM | POA: Diagnosis not present

## 2021-02-16 DIAGNOSIS — Z23 Encounter for immunization: Secondary | ICD-10-CM | POA: Diagnosis not present

## 2021-02-16 DIAGNOSIS — G9341 Metabolic encephalopathy: Secondary | ICD-10-CM | POA: Diagnosis not present

## 2021-02-16 DIAGNOSIS — G4733 Obstructive sleep apnea (adult) (pediatric): Secondary | ICD-10-CM | POA: Diagnosis not present

## 2021-02-16 DIAGNOSIS — F418 Other specified anxiety disorders: Secondary | ICD-10-CM | POA: Diagnosis not present

## 2021-02-16 NOTE — Telephone Encounter (Signed)
ATC x1, LVM to return call.

## 2021-02-17 DIAGNOSIS — W19XXXS Unspecified fall, sequela: Secondary | ICD-10-CM | POA: Diagnosis not present

## 2021-02-17 DIAGNOSIS — M6281 Muscle weakness (generalized): Secondary | ICD-10-CM | POA: Diagnosis not present

## 2021-02-17 DIAGNOSIS — R209 Unspecified disturbances of skin sensation: Secondary | ICD-10-CM | POA: Diagnosis not present

## 2021-02-17 DIAGNOSIS — M25541 Pain in joints of right hand: Secondary | ICD-10-CM | POA: Diagnosis not present

## 2021-02-17 DIAGNOSIS — M25631 Stiffness of right wrist, not elsewhere classified: Secondary | ICD-10-CM | POA: Diagnosis not present

## 2021-02-17 DIAGNOSIS — E119 Type 2 diabetes mellitus without complications: Secondary | ICD-10-CM | POA: Diagnosis not present

## 2021-02-17 DIAGNOSIS — M25641 Stiffness of right hand, not elsewhere classified: Secondary | ICD-10-CM | POA: Diagnosis not present

## 2021-02-17 DIAGNOSIS — M25531 Pain in right wrist: Secondary | ICD-10-CM | POA: Diagnosis not present

## 2021-02-17 MED ORDER — FLUTICASONE FUROATE-VILANTEROL 200-25 MCG/INH IN AEPB: 1.0000 | INHALATION_SPRAY | Freq: Every day | RESPIRATORY_TRACT | 0 refills | Status: DC

## 2021-02-17 NOTE — Telephone Encounter (Signed)
1 month supply for Breo 200 was sent to pt's local pharmacy. Called and spoke with pt letting her know this had been done and she verbalized understanding. Nothing further needed.

## 2021-02-23 ENCOUNTER — Encounter: Payer: Self-pay | Admitting: *Deleted

## 2021-02-23 DIAGNOSIS — R209 Unspecified disturbances of skin sensation: Secondary | ICD-10-CM | POA: Diagnosis not present

## 2021-02-23 DIAGNOSIS — E119 Type 2 diabetes mellitus without complications: Secondary | ICD-10-CM | POA: Diagnosis not present

## 2021-02-23 DIAGNOSIS — M6281 Muscle weakness (generalized): Secondary | ICD-10-CM | POA: Diagnosis not present

## 2021-02-23 DIAGNOSIS — M25641 Stiffness of right hand, not elsewhere classified: Secondary | ICD-10-CM | POA: Diagnosis not present

## 2021-02-23 DIAGNOSIS — Z87891 Personal history of nicotine dependence: Secondary | ICD-10-CM

## 2021-02-23 DIAGNOSIS — M25531 Pain in right wrist: Secondary | ICD-10-CM | POA: Diagnosis not present

## 2021-02-23 DIAGNOSIS — M25631 Stiffness of right wrist, not elsewhere classified: Secondary | ICD-10-CM | POA: Diagnosis not present

## 2021-02-23 DIAGNOSIS — W19XXXS Unspecified fall, sequela: Secondary | ICD-10-CM | POA: Diagnosis not present

## 2021-02-23 DIAGNOSIS — M25541 Pain in joints of right hand: Secondary | ICD-10-CM | POA: Diagnosis not present

## 2021-02-24 DIAGNOSIS — Z79899 Other long term (current) drug therapy: Secondary | ICD-10-CM | POA: Diagnosis not present

## 2021-02-24 DIAGNOSIS — H30033 Focal chorioretinal inflammation, peripheral, bilateral: Secondary | ICD-10-CM | POA: Diagnosis not present

## 2021-02-24 DIAGNOSIS — E109 Type 1 diabetes mellitus without complications: Secondary | ICD-10-CM | POA: Diagnosis not present

## 2021-02-24 DIAGNOSIS — Z961 Presence of intraocular lens: Secondary | ICD-10-CM | POA: Diagnosis not present

## 2021-02-24 DIAGNOSIS — H3581 Retinal edema: Secondary | ICD-10-CM | POA: Diagnosis not present

## 2021-03-05 NOTE — Progress Notes (Signed)
Result Letter Sent by Doroteo Glassman RN 12 month follow up LDCT ordered Fax results report  sent to PCP

## 2021-03-19 ENCOUNTER — Other Ambulatory Visit: Payer: Self-pay | Admitting: Adult Health

## 2021-03-20 ENCOUNTER — Telehealth: Payer: Self-pay | Admitting: *Deleted

## 2021-03-20 MED ORDER — FLUTICASONE FUROATE-VILANTEROL 200-25 MCG/INH IN AEPB: 1.0000 | INHALATION_SPRAY | Freq: Every day | RESPIRATORY_TRACT | 4 refills | Status: DC

## 2021-03-20 NOTE — Telephone Encounter (Signed)
Called to speak with patient regarding which pharmacy she wants her Breo sent to, initially it was sent to [facility], then it was sent to [facility].  She states the 3 month supply would be $125 and at [facility] monthly it would be $45.  [facility]ill sent to [facility], discontinued script sent to [facility].  Nothing further needed.

## 2021-03-20 NOTE — Addendum Note (Signed)
Addended by: Vanessa Barbara on: 03/20/2021 02:17 PM   Modules accepted: Orders

## 2021-03-20 NOTE — Addendum Note (Signed)
Addended by: Vanessa Barbara on: 03/20/2021 02:22 PM   Modules accepted: Orders

## 2021-04-05 DIAGNOSIS — Z1152 Encounter for screening for COVID-19: Secondary | ICD-10-CM | POA: Diagnosis not present

## 2021-04-05 DIAGNOSIS — J019 Acute sinusitis, unspecified: Secondary | ICD-10-CM | POA: Diagnosis not present

## 2021-04-05 DIAGNOSIS — E1040 Type 1 diabetes mellitus with diabetic neuropathy, unspecified: Secondary | ICD-10-CM | POA: Diagnosis not present

## 2021-04-05 DIAGNOSIS — R051 Acute cough: Secondary | ICD-10-CM | POA: Diagnosis not present

## 2021-04-05 DIAGNOSIS — J439 Emphysema, unspecified: Secondary | ICD-10-CM | POA: Diagnosis not present

## 2021-04-05 DIAGNOSIS — F17200 Nicotine dependence, unspecified, uncomplicated: Secondary | ICD-10-CM | POA: Diagnosis not present

## 2021-04-10 DIAGNOSIS — E10649 Type 1 diabetes mellitus with hypoglycemia without coma: Secondary | ICD-10-CM | POA: Diagnosis not present

## 2021-04-19 DIAGNOSIS — R5383 Other fatigue: Secondary | ICD-10-CM | POA: Diagnosis not present

## 2021-04-19 DIAGNOSIS — Z1152 Encounter for screening for COVID-19: Secondary | ICD-10-CM | POA: Diagnosis not present

## 2021-04-19 DIAGNOSIS — R062 Wheezing: Secondary | ICD-10-CM | POA: Diagnosis not present

## 2021-04-19 DIAGNOSIS — J439 Emphysema, unspecified: Secondary | ICD-10-CM | POA: Diagnosis not present

## 2021-04-19 DIAGNOSIS — J0101 Acute recurrent maxillary sinusitis: Secondary | ICD-10-CM | POA: Diagnosis not present

## 2021-04-19 DIAGNOSIS — H109 Unspecified conjunctivitis: Secondary | ICD-10-CM | POA: Diagnosis not present

## 2021-04-19 DIAGNOSIS — E1040 Type 1 diabetes mellitus with diabetic neuropathy, unspecified: Secondary | ICD-10-CM | POA: Diagnosis not present

## 2021-04-19 DIAGNOSIS — B9689 Other specified bacterial agents as the cause of diseases classified elsewhere: Secondary | ICD-10-CM | POA: Diagnosis not present

## 2021-04-25 DIAGNOSIS — Z794 Long term (current) use of insulin: Secondary | ICD-10-CM | POA: Diagnosis not present

## 2021-04-25 DIAGNOSIS — E785 Hyperlipidemia, unspecified: Secondary | ICD-10-CM | POA: Diagnosis not present

## 2021-04-25 DIAGNOSIS — E1040 Type 1 diabetes mellitus with diabetic neuropathy, unspecified: Secondary | ICD-10-CM | POA: Diagnosis not present

## 2021-04-25 DIAGNOSIS — Z23 Encounter for immunization: Secondary | ICD-10-CM | POA: Diagnosis not present

## 2021-04-25 DIAGNOSIS — B3731 Acute candidiasis of vulva and vagina: Secondary | ICD-10-CM | POA: Diagnosis not present

## 2021-04-25 DIAGNOSIS — Z79899 Other long term (current) drug therapy: Secondary | ICD-10-CM | POA: Diagnosis not present

## 2021-04-25 DIAGNOSIS — Z4681 Encounter for fitting and adjustment of insulin pump: Secondary | ICD-10-CM | POA: Diagnosis not present

## 2021-04-25 DIAGNOSIS — I10 Essential (primary) hypertension: Secondary | ICD-10-CM | POA: Diagnosis not present

## 2021-04-25 DIAGNOSIS — H30033 Focal chorioretinal inflammation, peripheral, bilateral: Secondary | ICD-10-CM | POA: Diagnosis not present

## 2021-05-01 ENCOUNTER — Ambulatory Visit (INDEPENDENT_AMBULATORY_CARE_PROVIDER_SITE_OTHER): Payer: Medicare HMO

## 2021-05-01 ENCOUNTER — Ambulatory Visit: Payer: Medicare HMO | Admitting: Podiatry

## 2021-05-01 ENCOUNTER — Other Ambulatory Visit: Payer: Self-pay

## 2021-05-01 ENCOUNTER — Encounter: Payer: Self-pay | Admitting: Podiatry

## 2021-05-01 DIAGNOSIS — M21612 Bunion of left foot: Secondary | ICD-10-CM | POA: Diagnosis not present

## 2021-05-01 DIAGNOSIS — M21619 Bunion of unspecified foot: Secondary | ICD-10-CM

## 2021-05-01 DIAGNOSIS — M79675 Pain in left toe(s): Secondary | ICD-10-CM

## 2021-05-01 DIAGNOSIS — L84 Corns and callosities: Secondary | ICD-10-CM | POA: Diagnosis not present

## 2021-05-02 ENCOUNTER — Other Ambulatory Visit: Payer: Self-pay | Admitting: Podiatry

## 2021-05-02 DIAGNOSIS — M21619 Bunion of unspecified foot: Secondary | ICD-10-CM

## 2021-05-03 NOTE — Progress Notes (Signed)
Subjective:   Patient ID: Ashley [name], female   DOB: 55 y.o.   MRN: [id number]   HPI Patient presents stating that she has a lot of pain on the big toe of her left foot and around the bunion site and is not sure what may have happened and its been this way for at least several weeks   ROS      Objective:  Physical Exam  Neurovascular status intact with structural deformity left first metatarsal with moderate deviation of the hallux with keratotic lesion on the medial side of the big toe which is sore when pressed and makes wearing shoe gear difficult.  Good digital perfusion well oriented     Assessment:  Structural malalignment of the foot which is creating bunion deformity and lesion on the hallux that has become inflamed and sore     Plan:  H&P reviewed condition with patient.  I do think ultimate structural correction may be necessary and I reviewed structural bunion correction and recovery for distal osteotomy.  At this point organ to try conservative and sterile debridement of the lesion which accomplished no iatrogenic bleeding padding applied to the toe and advised on wider shoe.  Reappoint as symptoms indicate  X-rays indicate moderate structural bunion deformity left no indications of lysis or other pathology related to the lesion and pain

## 2021-05-16 DIAGNOSIS — Z961 Presence of intraocular lens: Secondary | ICD-10-CM | POA: Diagnosis not present

## 2021-05-16 DIAGNOSIS — Z79899 Other long term (current) drug therapy: Secondary | ICD-10-CM | POA: Diagnosis not present

## 2021-05-16 DIAGNOSIS — E109 Type 1 diabetes mellitus without complications: Secondary | ICD-10-CM | POA: Diagnosis not present

## 2021-05-16 DIAGNOSIS — H3581 Retinal edema: Secondary | ICD-10-CM | POA: Diagnosis not present

## 2021-05-16 DIAGNOSIS — H30033 Focal chorioretinal inflammation, peripheral, bilateral: Secondary | ICD-10-CM | POA: Diagnosis not present

## 2021-05-17 ENCOUNTER — Telehealth: Payer: Self-pay | Admitting: Cardiology

## 2021-05-17 ENCOUNTER — Other Ambulatory Visit: Payer: Self-pay

## 2021-05-17 ENCOUNTER — Encounter: Payer: Self-pay | Admitting: Cardiology

## 2021-05-17 DIAGNOSIS — Z006 Encounter for examination for normal comparison and control in clinical research program: Secondary | ICD-10-CM

## 2021-05-17 NOTE — Telephone Encounter (Signed)
Patient screened today for the trial

## 2021-05-17 NOTE — Progress Notes (Signed)
Chief Complaint  Patient presents with   Research     ICD-10-CM   1. Research exam: Being screened into Prevail-ASCVD trial CETP inhibitor 10 mg PO Q daily in patients with PAD, CAD and LDL >80.  Z00.6       Patient being screened.    Adrian Prows, MD, [facility] 05/17/2021, 1:16 PM Office: [phone] Fax: [phone] Pager: [phone]

## 2021-06-01 DIAGNOSIS — R300 Dysuria: Secondary | ICD-10-CM | POA: Diagnosis not present

## 2021-06-07 ENCOUNTER — Telehealth: Payer: Self-pay | Admitting: Pulmonary Disease

## 2021-06-07 ENCOUNTER — Other Ambulatory Visit: Payer: Self-pay

## 2021-06-07 ENCOUNTER — Ambulatory Visit: Payer: Medicare HMO | Admitting: Pulmonary Disease

## 2021-06-07 ENCOUNTER — Encounter: Payer: Self-pay | Admitting: Pulmonary Disease

## 2021-06-07 VITALS — BP 138/82 | HR 75 | Temp 97.8°F | Ht 59.0 in | Wt 172.6 lb

## 2021-06-07 DIAGNOSIS — Z8616 Personal history of COVID-19: Secondary | ICD-10-CM

## 2021-06-07 DIAGNOSIS — L270 Generalized skin eruption due to drugs and medicaments taken internally: Secondary | ICD-10-CM | POA: Diagnosis not present

## 2021-06-07 DIAGNOSIS — R0602 Shortness of breath: Secondary | ICD-10-CM

## 2021-06-07 DIAGNOSIS — Z87891 Personal history of nicotine dependence: Secondary | ICD-10-CM | POA: Diagnosis not present

## 2021-06-07 DIAGNOSIS — R911 Solitary pulmonary nodule: Secondary | ICD-10-CM | POA: Diagnosis not present

## 2021-06-07 DIAGNOSIS — R21 Rash and other nonspecific skin eruption: Secondary | ICD-10-CM

## 2021-06-07 DIAGNOSIS — J4540 Moderate persistent asthma, uncomplicated: Secondary | ICD-10-CM | POA: Diagnosis not present

## 2021-06-07 MED ORDER — PREDNISONE 10 MG PO TABS: ORAL_TABLET | ORAL | 0 refills | Status: DC

## 2021-06-07 MED ORDER — BREZTRI AEROSPHERE 160-9-4.8 MCG/ACT IN AERO: 2.0000 | INHALATION_SPRAY | Freq: Two times a day (BID) | RESPIRATORY_TRACT | 3 refills | Status: DC

## 2021-06-07 MED ORDER — BREZTRI AEROSPHERE 160-9-4.8 MCG/ACT IN AERO: 2.0000 | INHALATION_SPRAY | Freq: Two times a day (BID) | RESPIRATORY_TRACT | 0 refills | Status: DC

## 2021-06-07 MED ORDER — ALBUTEROL SULFATE (2.5 MG/3ML) 0.083% IN NEBU: 2.5000 mg | INHALATION_SOLUTION | RESPIRATORY_TRACT | 3 refills | Status: AC | PRN

## 2021-06-07 NOTE — Progress Notes (Signed)
Synopsis: Referred in July 2022 for history of covid, by Prince Solian, MD  Subjective:   PATIENT ID: Ashley [name] GENDER: female DOB: 06-07-1966, MRN: [id number]  Chief Complaint  Patient presents with   Follow-up    Patient states that she is still short of breath all the time worse with exertion. Cough sometimes with clear sputum.    55 year old female, past medical history of COPD, former smoker, 40 years, half pack per day, 20-pack-year history, quit January 2021.  Gastroparesis, GERD, hypertension, hyperlipidemia.  Patient developed COVID-19 bilateral pneumonia in January 2022.  She was admitted to [facility] was intubated and placed on mechanical life support for 8 days.  She has had a subsequent long recovery but is now off oxygen.  She still feels short of breath and has cough at times.  OV 06/07/2021: Here today for follow-up regarding COPD/asthma.  She is also post COVID with ongoing symptoms despite inhaler regimen.  She is currently on Breo plus Pulmicort plus as needed albuterol.  Last set of labs were completed during a hospitalization with no significant eosinophilic elevation, eosinophils were 100 but she was on prednisone during this hospitalization back in January 2022.  She still has triggers from smoke or any kind of particulate matter.  Strong smells in the cold weather are still stimulating her to have difficulty breathing.  It seems that she has more of a reactive airway at this time.  Of note she recently had a urinary tract infection was given Macrobid.  She is allergic to sulfa medications.  She broke out in a diffuse morbilliform rash.    Past Medical History:  Diagnosis Date   Anxiety    COPD (chronic obstructive pulmonary disease) (HCC)    Depression    DOE (dyspnea on exertion)    Gastroparesis    GERD (gastroesophageal reflux disease)    Glaucoma, both eyes    History of 2019 novel coronavirus disease (COVID-19) 06/20/2020    hospital admission-- dx severe covid with pneumonia, ARDS, DKA, COPD exaberation;  pt intubated 01-13th and extubated 01-20th;   (11-15-2020  per pt did not go home on oxygen, residual generalized weakness and hair loss)   History of esophageal stricture    post dilatation, followed by dr Henrene Pastor   History of Graves' disease    s/p RAI  [id number]   History of Helicobacter pylori infection    remote hx and treatment   History of kidney stones    Hyperlipidemia    Hypertension    followed by pcp   Hypothyroidism, postradioiodine therapy 02/2010   followed by pcp   IDA (iron deficiency anemia)    Insulin pump in place    w/ Novolog , managed by pcp   Moderate asthma    followed by pcp,  (11-15-2020  per pt has pulmonology appt on 11-17-2020))   OSA (obstructive sleep apnea)    11-15-2020  per pt has not used cpap since 05/ 2021  (previous followed by dr Brett Fairy,  study in epic 05-12-2018 moderate complex osa pt had used for awhile)   Osteoporosis    PAD (peripheral artery disease) ([place])    followed by dr berry----  s/p bilateral common iliac artery angioplasty stenting for stenosis   Peripheral neuropathy    PONV (postoperative nausea and vomiting)    Right wrist fracture    Type 1 diabetes mellitus with long-term current use of insulin ([place])    per pt dx at age 28  approx,  followed by pcp,  (11-15-2020  per pt last A1c 7.2 in 04/ 2022),  checks blood sugar 6 to 10 times daily , fasting sugar-- 200 -- 300)   Vitamin D deficiency      Family History  Problem Relation Age of Onset   Diabetes Father    Heart attack Father    Alcoholism Father    Lung cancer Mother    Liver cancer Mother    Irritable bowel syndrome Sister    Colon cancer Neg Hx    Esophageal cancer Neg Hx    Rectal cancer Neg Hx    Stomach cancer Neg Hx      Past Surgical History:  Procedure Laterality Date   ABDOMINAL AORTOGRAM W/LOWER EXTREMITY Bilateral 01/01/2019   Procedure: ABDOMINAL AORTOGRAM W/LOWER  EXTREMITY;  Surgeon: Lorretta Harp, MD;  Location: [facility];  Service: Cardiovascular;  Laterality: Bilateral;   BREAST EXCISIONAL BIOPSY Left 04-*02-2000  _0    per pt benign   CATARACT EXTRACTION W/ INTRAOCULAR LENS  IMPLANT, BILATERAL  11/2019   COLONOSCOPY  last one 2019 approx   LAPAROSCOPIC ASSISTED VAGINAL HYSTERECTOMY  06-22-2004 _1    NASAL SEPTOPLASTY W/ TURBINOPLASTY Bilateral 09/07/2013   Procedure: SEPTOPLASTY, BILATERAL TURBINATE RESECTION ;  Surgeon: Ascencion Dike, MD;  Location: [place];  Service: ENT;  Laterality: Bilateral;   OPEN REDUCTION INTERNAL FIXATION (ORIF) DISTAL RADIAL FRACTURE Right 11/16/2020   Procedure: OPEN REDUCTION INTERNAL FIXATION (ORIF) DISTAL RADIAL FRACTURE;  Surgeon: Hiram Gash, MD;  Location: [place];  Service: Orthopedics;  Laterality: Right;   PERIPHERAL VASCULAR INTERVENTION Bilateral 01/01/2019   Procedure: PERIPHERAL VASCULAR INTERVENTION;  Surgeon: Lorretta Harp, MD;  Location: [facility];  Service: Cardiovascular;  Laterality: Bilateral;   UPPER GASTROINTESTINAL ENDOSCOPY  last one 07-23-2019  dr Henrene Pastor    Social History   Socioeconomic History   Marital status: Married    Spouse name: Not on file   Number of children: 1   Years of education: Not on file   Highest education level: Not on file  Occupational History   Not on file  Tobacco Use   Smoking status: Former    Packs/day: 0.50    Years: 25.00    Pack years: 12.50    Types: Cigarettes    Start date: 33    Quit date: 06/20/2020    Years since quitting: 0.9   Smokeless tobacco: Never   Tobacco comments:    Started smoking at age [age]  Vaping Use   Vaping Use: Never used  Substance and Sexual Activity   Alcohol use: No    Alcohol/week: 0.0 standard drinks   Drug use: No   Sexual activity: Not on file  Other Topics Concern   Not on file  Social History Narrative   Lives home with husband.  Education 12th grade.   Caffeine 4-5 drinks daily.   Social Determinants of Health   Financial Resource Strain: Not on file  Food Insecurity: Not on file  Transportation Needs: Not on file  Physical Activity: Not on file  Stress: Not on file  Social Connections: Not on file  Intimate Partner Violence: Not on file     Allergies  Allergen Reactions   Mirtazapine Hives and Rash   Clindamycin Hives   Macrobid [Nitrofurantoin] Hives   Other Hives    Tylox    Sulfonamide Derivatives Hives   Lipitor [Atorvastatin] Other (See Comments)    MUSCLE  ACHES   Restasis [Cyclosporine]     Pt stated, "My eyes turned red on the inside and outside of eye; burning sensation"   Topamax [Topiramate] Itching     Outpatient Medications Prior to Visit  Medication Sig Dispense Refill   ACCU-CHEK COMPACT PLUS test strip 1 each by Other route as needed.      ACCU-CHEK SOFTCLIX LANCETS lancets      Albuterol Sulfate, sensor, (PROAIR DIGIHALER) 108 (90 Base) MCG/ACT AEPB Inhale into the lungs as needed.     ALPRAZolam (XANAX) 0.5 MG tablet Take 0.5-1 mg by mouth 3 (three) times daily as needed for anxiety.     AMBULATORY NON FORMULARY MEDICATION NOVOLOG INSULIN PUMP USES AS DIRECTED     Blood Glucose Monitoring Suppl (ACCU-CHEK AVIVA PLUS) w/Device KIT      busPIRone (BUSPAR) 15 MG tablet Take 15 mg by mouth 2 (two) times daily.     clopidogrel (PLAVIX) 75 MG tablet TAKE 1 TABLET EVERY DAY WITH BREAKFAST (Patient taking differently: Take 75 mg by mouth daily.) 90 tablet 3   DULoxetine (CYMBALTA) 30 MG capsule Take 30 mg by mouth every morning. Takes with 60 mg to total 90 mg  1   estradiol (ESTRACE) 0.5 MG tablet Take 0.5 mg by mouth daily.     famotidine (PEPCID) 40 MG tablet TAKE 1 TABLET AT BEDTIME 90 tablet 1   fluticasone furoate-vilanterol (BREO ELLIPTA) 200-25 MCG/INH AEPB Inhale 1 puff into the lungs daily. 60 each 4   folic acid (FOLVITE) 1 MG tablet Take 1 tablet by mouth daily.     Insulin Human (INSULIN PUMP)  SOLN Inject into the skin. Novolog----  basal rate--- 12am to 2 am = 1.3units                                          2am  to  6am  = 1.4units                                          6am  to  ?     lansoprazole (PREVACID) 30 MG capsule Take 1 capsule (30 mg total) by mouth 2 (two) times daily before a meal. 180 capsule 1   latanoprost (XALATAN) 0.005 % ophthalmic solution Place 1 drop into both eyes at bedtime.  11   levocetirizine (XYZAL) 5 MG tablet Take 5 mg by mouth every morning.      levothyroxine (SYNTHROID) 125 MCG tablet Take 125 mcg by mouth daily before breakfast.     methotrexate (RHEUMATREX) 2.5 MG tablet Take 15 mg by mouth once a week. 3 tablets am and at bedtime twice a day once a week,  Saturday's     montelukast (SINGULAIR) 10 MG tablet Take 10 mg by mouth at bedtime.     NOVOLOG 100 UNIT/ML injection Inject into the skin See admin instructions. Pt has Insulin pump     pregabalin (LYRICA) 75 MG capsule Take 75-150 mg by mouth 3 (three) times daily. Takes one cap in am and lunch, two cap at bedtime     rosuvastatin (CRESTOR) 20 MG tablet Take 20 mg by mouth at bedtime.     Vitamin D, Ergocalciferol, (DRISDOL) 50000 UNITS CAPS Take 50,000 Units by mouth every 7 (seven) days. Saturday's  albuterol (PROVENTIL) (2.5 MG/3ML) 0.083% nebulizer solution Take 2.5 mg by nebulization every 4 (four) hours as needed for wheezing or shortness of breath.   3   BREO ELLIPTA 200-25 MCG/INH AEPB TAKE 1 PUFF BY MOUTH EVERY DAY 60 each 0   No facility-administered medications prior to visit.    Review of Systems  Constitutional:  Negative for chills, fever, malaise/fatigue and weight loss.  HENT:  Negative for hearing loss, sore throat and tinnitus.   Eyes:  Negative for blurred vision and double vision.  Respiratory:  Positive for cough, shortness of breath and wheezing. Negative for hemoptysis, sputum production and stridor.   Cardiovascular:  Negative for chest pain, palpitations,  orthopnea, leg swelling and PND.  Gastrointestinal:  Negative for abdominal pain, constipation, diarrhea, heartburn, nausea and vomiting.  Genitourinary:  Negative for dysuria, hematuria and urgency.  Musculoskeletal:  Negative for joint pain and myalgias.  Skin:  Positive for itching and rash.  Neurological:  Negative for dizziness, tingling, weakness and headaches.  Endo/Heme/Allergies:  Negative for environmental allergies. Does not bruise/bleed easily.  Psychiatric/Behavioral:  Negative for depression. The patient is not nervous/anxious and does not have insomnia.   All other systems reviewed and are negative.   Objective:  Physical Exam Vitals reviewed.  Constitutional:      General: She is not in acute distress.    Appearance: She is well-developed. She is obese.  HENT:     Head: Normocephalic and atraumatic.  Eyes:     General: No scleral icterus.    Conjunctiva/sclera: Conjunctivae normal.     Pupils: Pupils are equal, round, and reactive to light.  Neck:     Vascular: No JVD.     Trachea: No tracheal deviation.  Cardiovascular:     Rate and Rhythm: Normal rate and regular rhythm.     Heart sounds: Normal heart sounds. No murmur heard. Pulmonary:     Effort: Pulmonary effort is normal. No tachypnea, accessory muscle usage or respiratory distress.     Breath sounds: No stridor. No wheezing, rhonchi or rales.  Abdominal:     General: There is no distension.     Palpations: Abdomen is soft.     Tenderness: There is no abdominal tenderness.  Musculoskeletal:        General: No tenderness.     Cervical back: Neck supple.  Lymphadenopathy:     Cervical: No cervical adenopathy.  Skin:    General: Skin is warm and dry.     Capillary Refill: Capillary refill takes less than 2 seconds.     Findings: No rash.  Neurological:     Mental Status: She is alert and oriented to person, place, and time.  Psychiatric:        Behavior: Behavior normal.   Legs:         Vitals:   06/07/21 1150  BP: 138/82  Pulse: 75  Temp: 97.8 F (36.6 C)  TempSrc: Oral  SpO2: 97%  Weight: 172 lb 9.6 oz (78.3 kg)  Height: _0  (1.499 m)   97% on RA BMI Readings from Last 3 Encounters:  06/07/21 34.86 kg/m  02/02/21 32.97 kg/m  02/01/21 33.89 kg/m   Wt Readings from Last 3 Encounters:  06/07/21 172 lb 9.6 oz (78.3 kg)  02/02/21 168 lb 12.8 oz (76.6 kg)  02/01/21 167 lb 12.8 oz (76.1 kg)     CBC    Component Value Date/Time   WBC 15.4 (H) 07/06/2020 0416   RBC 3.11 (L)  07/06/2020 0416   HGB 9.6 (L) 07/06/2020 0416   HGB 11.6 01/09/2019 1204   HCT 29.5 (L) 07/06/2020 0416   HCT 36.2 01/09/2019 1204   PLT 302 07/06/2020 0416   PLT 388 01/09/2019 1204   MCV 94.9 07/06/2020 0416   MCV 82 01/09/2019 1204   MCH 30.9 07/06/2020 0416   MCHC 32.5 07/06/2020 0416   RDW 14.4 07/06/2020 0416   RDW 13.5 01/09/2019 1204   LYMPHSABS 3.0 07/06/2020 0416   MONOABS 0.9 07/06/2020 0416   EOSABS 0.1 07/06/2020 0416   BASOSABS 0.1 07/06/2020 0416     Chest Imaging: No recent chest imaging  CTA chest 06/21/2020: Bilateral groundglass opacities concerning for multifocal pneumonia related to COVID-19, enlarged mediastinal and hilar node consider reactive. No evidence of PE. The patient's images have been independently reviewed by me.    Pulmonary Functions Testing Results: PFT Results Latest Ref Rng & Units 02/02/2021  FVC-Pre L 2.25  FVC-Predicted Pre % 76  FVC-Post L 2.22  FVC-Predicted Post % 75  Pre FEV1/FVC % % 85  Post FEV1/FCV % % 87  FEV1-Pre L 1.91  FEV1-Predicted Pre % 82  FEV1-Post L 1.94  DLCO uncorrected ml/min/mmHg 14.42  DLCO UNC% % 80  DLCO corrected ml/min/mmHg 14.84  DLCO COR %Predicted % 82  DLVA Predicted % 94  TLC L 3.89  TLC % Predicted % 87  RV % Predicted % 81    FeNO:   Pathology:   Echocardiogram:   Heart Catheterization:     Assessment & Plan:     ICD-10-CM   1. Moderate persistent asthma  without complication  Z30.86     2. History of COVID-19  Z86.16     3. SOB (shortness of breath)  R06.02     4. Former smoker  Z87.891     5. Morbilliform rash  R21     6. Drug rash  L27.0     7. Nodule of left lung  R91.1       Discussion:  This is a 55 year old female, history of COVID-19 viral pneumonia bilateral disease, was on mechanical life support for 8 days.  She is a former smoker of 40 years, 20-pack-year history.  Recently treated for UTI with Macrobid and developed significant drug rash.  This is still going on.  Plan: As for her shortness of breath and recurrent what appears to be reactive airway and asthma type symptoms she is feeling significantly short of breath.  She is having recurrent flareups throughout the week and using her albuterol multiple times throughout the week. I think she needs to step up her inhaler therapy to triple therapy. Start Breztri with spacer.,  Samples and new prescription today. Continue Pulmicort twice daily as well as albuterol as needed.  As for her drug rash I will start her on prednisone taper.  Hopefully this will help clear this up sooner rather than later.  She needs to see [place] back in approximately 8 weeks.  If she still having ongoing reactive airway and persistent asthma symptoms I think we need to get CBC with differential, IgE levels.  If those are elevated consider biologic start however she may be a good candidate for [place]sp[place].   We will see her back in approximately 8 weeks for consideration of biologic eval.   Current Outpatient Medications:    ACCU-CHEK COMPACT PLUS test strip, 1 each by Other route as needed. , Disp: , Rfl:    ACCU-CHEK SOFTCLIX LANCETS lancets, , Disp: ,  Rfl:    Albuterol Sulfate, sensor, (PROAIR DIGIHALER) 108 (90 Base) MCG/ACT AEPB, Inhale into the lungs as needed., Disp: , Rfl:    ALPRAZolam (XANAX) 0.5 MG tablet, Take 0.5-1 mg by mouth 3 (three) times daily as needed for anxiety., Disp: , Rfl:     AMBULATORY NON FORMULARY MEDICATION, NOVOLOG INSULIN PUMP USES AS DIRECTED, Disp: , Rfl:    Blood Glucose Monitoring Suppl (ACCU-CHEK AVIVA PLUS) w/Device KIT, , Disp: , Rfl:    Budeson-Glycopyrrol-Formoterol (BREZTRI AEROSPHERE) 160-9-4.8 MCG/ACT AERO, Inhale 2 puffs into the lungs in the morning and at bedtime., Disp: 10.7 g, Rfl: 0   Budeson-Glycopyrrol-Formoterol (BREZTRI AEROSPHERE) 160-9-4.8 MCG/ACT AERO, Inhale 2 puffs into the lungs in the morning and at bedtime., Disp: 10.7 g, Rfl: 3   busPIRone (BUSPAR) 15 MG tablet, Take 15 mg by mouth 2 (two) times daily., Disp: , Rfl:    clopidogrel (PLAVIX) 75 MG tablet, TAKE 1 TABLET EVERY DAY WITH BREAKFAST (Patient taking differently: Take 75 mg by mouth daily.), Disp: 90 tablet, Rfl: 3   DULoxetine (CYMBALTA) 30 MG capsule, Take 30 mg by mouth every morning. Takes with 60 mg to total 90 mg, Disp: , Rfl: 1   estradiol (ESTRACE) 0.5 MG tablet, Take 0.5 mg by mouth daily., Disp: , Rfl:    famotidine (PEPCID) 40 MG tablet, TAKE 1 TABLET AT BEDTIME, Disp: 90 tablet, Rfl: 1   fluticasone furoate-vilanterol (BREO ELLIPTA) 200-25 MCG/INH AEPB, Inhale 1 puff into the lungs daily., Disp: 60 each, Rfl: 4   folic acid (FOLVITE) 1 MG tablet, Take 1 tablet by mouth daily., Disp: , Rfl:    Insulin Human (INSULIN PUMP) SOLN, Inject into the skin. Novolog----  basal rate--- 12am to 2 am = 1.3units                                          2am  to  6am  = 1.4units                                          6am  to  ?, Disp: , Rfl:    lansoprazole (PREVACID) 30 MG capsule, Take 1 capsule (30 mg total) by mouth 2 (two) times daily before a meal., Disp: 180 capsule, Rfl: 1   latanoprost (XALATAN) 0.005 % ophthalmic solution, Place 1 drop into both eyes at bedtime., Disp: , Rfl: 11   levocetirizine (XYZAL) 5 MG tablet, Take 5 mg by mouth every morning. , Disp: , Rfl:    levothyroxine (SYNTHROID) 125 MCG tablet, Take 125 mcg by mouth daily before breakfast., Disp: , Rfl:     methotrexate (RHEUMATREX) 2.5 MG tablet, Take 15 mg by mouth once a week. 3 tablets am and at bedtime twice a day once a week,  Saturday's, Disp: , Rfl:    montelukast (SINGULAIR) 10 MG tablet, Take 10 mg by mouth at bedtime., Disp: , Rfl:    NOVOLOG 100 UNIT/ML injection, Inject into the skin See admin instructions. Pt has Insulin pump, Disp: , Rfl:    predniSONE (DELTASONE) 10 MG tablet, Take 4 tabs by mouth once daily x4 days, then 3 tabs x4 days, 2 tabs x4 days, 1 tab x4 days and stop., Disp: 40 tablet, Rfl: 0   pregabalin (LYRICA) 75 MG capsule, Take  75-150 mg by mouth 3 (three) times daily. Takes one cap in am and lunch, two cap at bedtime, Disp: , Rfl:    rosuvastatin (CRESTOR) 20 MG tablet, Take 20 mg by mouth at bedtime., Disp: , Rfl:    Vitamin D, Ergocalciferol, (DRISDOL) 50000 UNITS CAPS, Take 50,000 Units by mouth every 7 (seven) days. Saturday's, Disp: , Rfl:    albuterol (PROVENTIL) (2.5 MG/3ML) 0.083% nebulizer solution, Take 3 mLs (2.5 mg total) by nebulization every 4 (four) hours as needed for wheezing or shortness of breath., Disp: 75 mL, Rfl: 3   Garner Nash, DO [facility] 06/07/2021 12:57 PM

## 2021-06-07 NOTE — Patient Instructions (Addendum)
Thank you for visiting Dr. Valeta Harms at [facility]. Today we recommend the following:  Breztri samples and new prescription today w/ spacer Continue albuterol   Meds ordered this encounter  Medications   predniSONE (DELTASONE) 10 MG tablet    Sig: Take 4 tabs by mouth once daily x4 days, then 3 tabs x4 days, 2 tabs x4 days, 1 tab x4 days and stop.    Dispense:  40 tablet    Refill:  0   Return in about 8 weeks (around 08/02/2021) for with Rexene Edison, NP or Dr. Valeta Harms.    Please do your part to reduce the spread of COVID-19.

## 2021-06-08 MED ORDER — BUDESONIDE 0.25 MG/2ML IN SUSP: 0.2500 mg | Freq: Two times a day (BID) | RESPIRATORY_TRACT | 1 refills | Status: DC

## 2021-06-08 NOTE — Telephone Encounter (Signed)
Spoke with patient, she is requesting a refill of her pulmicort nebulizer solution to be called into her pharmacy.  I let her know that I did not see that medication listed on her medication list from her visit yesterday and she was started on Breztri.  Advised her I would send a message to Dr. Valeta Harms to get clarification.  Dr. Valeta Harms, Please advise if patient is to use pulmicort nebulizer solution while using Breztri inhaler.  Thank you.

## 2021-06-08 NOTE — Telephone Encounter (Signed)
Called and spoke with patient, advised that Dr. Valeta Harms said we could send in the Pulmicort nebulizer solution for her to her mail order pharmacy.  Script sent.  Nothing further needed.

## 2021-06-08 NOTE — Telephone Encounter (Signed)
Lm for patient.  

## 2021-06-26 ENCOUNTER — Other Ambulatory Visit: Payer: Self-pay | Admitting: Cardiovascular Disease

## 2021-07-04 DIAGNOSIS — J439 Emphysema, unspecified: Secondary | ICD-10-CM | POA: Diagnosis not present

## 2021-07-04 DIAGNOSIS — K219 Gastro-esophageal reflux disease without esophagitis: Secondary | ICD-10-CM | POA: Diagnosis not present

## 2021-07-04 DIAGNOSIS — R051 Acute cough: Secondary | ICD-10-CM | POA: Diagnosis not present

## 2021-07-04 DIAGNOSIS — F17211 Nicotine dependence, cigarettes, in remission: Secondary | ICD-10-CM | POA: Diagnosis not present

## 2021-07-04 DIAGNOSIS — H30033 Focal chorioretinal inflammation, peripheral, bilateral: Secondary | ICD-10-CM | POA: Diagnosis not present

## 2021-07-04 DIAGNOSIS — Z1152 Encounter for screening for COVID-19: Secondary | ICD-10-CM | POA: Diagnosis not present

## 2021-07-04 DIAGNOSIS — J029 Acute pharyngitis, unspecified: Secondary | ICD-10-CM | POA: Diagnosis not present

## 2021-07-04 DIAGNOSIS — E1140 Type 2 diabetes mellitus with diabetic neuropathy, unspecified: Secondary | ICD-10-CM | POA: Diagnosis not present

## 2021-07-04 DIAGNOSIS — H6591 Unspecified nonsuppurative otitis media, right ear: Secondary | ICD-10-CM | POA: Diagnosis not present

## 2021-07-04 DIAGNOSIS — J45909 Unspecified asthma, uncomplicated: Secondary | ICD-10-CM | POA: Diagnosis not present

## 2021-07-13 DIAGNOSIS — K219 Gastro-esophageal reflux disease without esophagitis: Secondary | ICD-10-CM | POA: Diagnosis not present

## 2021-07-13 DIAGNOSIS — E1140 Type 2 diabetes mellitus with diabetic neuropathy, unspecified: Secondary | ICD-10-CM | POA: Diagnosis not present

## 2021-07-13 DIAGNOSIS — F418 Other specified anxiety disorders: Secondary | ICD-10-CM | POA: Diagnosis not present

## 2021-07-13 DIAGNOSIS — J439 Emphysema, unspecified: Secondary | ICD-10-CM | POA: Diagnosis not present

## 2021-07-13 DIAGNOSIS — E10649 Type 1 diabetes mellitus with hypoglycemia without coma: Secondary | ICD-10-CM | POA: Diagnosis not present

## 2021-07-13 DIAGNOSIS — J45909 Unspecified asthma, uncomplicated: Secondary | ICD-10-CM | POA: Diagnosis not present

## 2021-07-13 DIAGNOSIS — I10 Essential (primary) hypertension: Secondary | ICD-10-CM | POA: Diagnosis not present

## 2021-07-13 DIAGNOSIS — H15003 Unspecified scleritis, bilateral: Secondary | ICD-10-CM | POA: Diagnosis not present

## 2021-07-13 DIAGNOSIS — J302 Other seasonal allergic rhinitis: Secondary | ICD-10-CM | POA: Diagnosis not present

## 2021-07-22 ENCOUNTER — Encounter: Payer: Self-pay | Admitting: Emergency Medicine

## 2021-07-22 ENCOUNTER — Emergency Department: Payer: Medicare HMO

## 2021-07-22 ENCOUNTER — Encounter
Admission: EM | Disposition: A | Discharge status: Home / Self Care | Payer: Self-pay | Source: Home / Self Care | Attending: Internal Medicine

## 2021-07-22 ENCOUNTER — Other Ambulatory Visit: Payer: Self-pay

## 2021-07-22 ENCOUNTER — Inpatient Hospital Stay
Admission: EM | Admit: 2021-07-22 | Discharge: 2021-07-25 | DRG: 854 | Disposition: A | Discharge status: Home / Self Care | Payer: Medicare HMO | Attending: Internal Medicine | Admitting: Internal Medicine

## 2021-07-22 ENCOUNTER — Inpatient Hospital Stay: Payer: Medicare HMO

## 2021-07-22 DIAGNOSIS — N179 Acute kidney failure, unspecified: Secondary | ICD-10-CM | POA: Diagnosis present

## 2021-07-22 DIAGNOSIS — A4151 Sepsis due to Escherichia coli [E. coli]: Secondary | ICD-10-CM | POA: Diagnosis not present

## 2021-07-22 DIAGNOSIS — E1051 Type 1 diabetes mellitus with diabetic peripheral angiopathy without gangrene: Secondary | ICD-10-CM | POA: Diagnosis present

## 2021-07-22 DIAGNOSIS — E1065 Type 1 diabetes mellitus with hyperglycemia: Secondary | ICD-10-CM | POA: Diagnosis not present

## 2021-07-22 DIAGNOSIS — E669 Obesity, unspecified: Secondary | ICD-10-CM | POA: Diagnosis present

## 2021-07-22 DIAGNOSIS — N139 Obstructive and reflux uropathy, unspecified: Secondary | ICD-10-CM | POA: Diagnosis not present

## 2021-07-22 DIAGNOSIS — J449 Chronic obstructive pulmonary disease, unspecified: Secondary | ICD-10-CM | POA: Diagnosis present

## 2021-07-22 DIAGNOSIS — Z20822 Contact with and (suspected) exposure to covid-19: Secondary | ICD-10-CM | POA: Diagnosis present

## 2021-07-22 DIAGNOSIS — Z801 Family history of malignant neoplasm of trachea, bronchus and lung: Secondary | ICD-10-CM

## 2021-07-22 DIAGNOSIS — Z811 Family history of alcohol abuse and dependence: Secondary | ICD-10-CM

## 2021-07-22 DIAGNOSIS — E039 Hypothyroidism, unspecified: Secondary | ICD-10-CM | POA: Diagnosis present

## 2021-07-22 DIAGNOSIS — R531 Weakness: Secondary | ICD-10-CM | POA: Diagnosis not present

## 2021-07-22 DIAGNOSIS — Z794 Long term (current) use of insulin: Secondary | ICD-10-CM

## 2021-07-22 DIAGNOSIS — E785 Hyperlipidemia, unspecified: Secondary | ICD-10-CM | POA: Diagnosis present

## 2021-07-22 DIAGNOSIS — A4150 Gram-negative sepsis, unspecified: Secondary | ICD-10-CM | POA: Diagnosis not present

## 2021-07-22 DIAGNOSIS — Z9641 Presence of insulin pump (external) (internal): Secondary | ICD-10-CM | POA: Diagnosis present

## 2021-07-22 DIAGNOSIS — Z833 Family history of diabetes mellitus: Secondary | ICD-10-CM | POA: Diagnosis not present

## 2021-07-22 DIAGNOSIS — R509 Fever, unspecified: Secondary | ICD-10-CM | POA: Diagnosis not present

## 2021-07-22 DIAGNOSIS — N136 Pyonephrosis: Secondary | ICD-10-CM | POA: Diagnosis not present

## 2021-07-22 DIAGNOSIS — N390 Urinary tract infection, site not specified: Secondary | ICD-10-CM | POA: Diagnosis not present

## 2021-07-22 DIAGNOSIS — N201 Calculus of ureter: Secondary | ICD-10-CM

## 2021-07-22 DIAGNOSIS — Z87891 Personal history of nicotine dependence: Secondary | ICD-10-CM | POA: Diagnosis not present

## 2021-07-22 DIAGNOSIS — K3184 Gastroparesis: Secondary | ICD-10-CM | POA: Diagnosis present

## 2021-07-22 DIAGNOSIS — Z7951 Long term (current) use of inhaled steroids: Secondary | ICD-10-CM

## 2021-07-22 DIAGNOSIS — N10 Acute tubulo-interstitial nephritis: Secondary | ICD-10-CM | POA: Diagnosis not present

## 2021-07-22 DIAGNOSIS — K219 Gastro-esophageal reflux disease without esophagitis: Secondary | ICD-10-CM | POA: Diagnosis present

## 2021-07-22 DIAGNOSIS — E876 Hypokalemia: Secondary | ICD-10-CM | POA: Diagnosis present

## 2021-07-22 DIAGNOSIS — Z888 Allergy status to other drugs, medicaments and biological substances status: Secondary | ICD-10-CM

## 2021-07-22 DIAGNOSIS — N1330 Unspecified hydronephrosis: Secondary | ICD-10-CM | POA: Diagnosis present

## 2021-07-22 DIAGNOSIS — M810 Age-related osteoporosis without current pathological fracture: Secondary | ICD-10-CM | POA: Diagnosis present

## 2021-07-22 DIAGNOSIS — I10 Essential (primary) hypertension: Secondary | ICD-10-CM | POA: Diagnosis present

## 2021-07-22 DIAGNOSIS — Z79890 Hormone replacement therapy (postmenopausal): Secondary | ICD-10-CM

## 2021-07-22 DIAGNOSIS — H409 Unspecified glaucoma: Secondary | ICD-10-CM | POA: Diagnosis present

## 2021-07-22 DIAGNOSIS — F419 Anxiety disorder, unspecified: Secondary | ICD-10-CM | POA: Diagnosis present

## 2021-07-22 DIAGNOSIS — N200 Calculus of kidney: Secondary | ICD-10-CM | POA: Diagnosis not present

## 2021-07-22 DIAGNOSIS — Z8249 Family history of ischemic heart disease and other diseases of the circulatory system: Secondary | ICD-10-CM | POA: Diagnosis not present

## 2021-07-22 DIAGNOSIS — R739 Hyperglycemia, unspecified: Secondary | ICD-10-CM | POA: Diagnosis not present

## 2021-07-22 DIAGNOSIS — R000 Tachycardia, unspecified: Secondary | ICD-10-CM | POA: Diagnosis not present

## 2021-07-22 DIAGNOSIS — B9620 Unspecified Escherichia coli [E. coli] as the cause of diseases classified elsewhere: Secondary | ICD-10-CM

## 2021-07-22 DIAGNOSIS — Z881 Allergy status to other antibiotic agents status: Secondary | ICD-10-CM

## 2021-07-22 DIAGNOSIS — Z6836 Body mass index (BMI) 36.0-36.9, adult: Secondary | ICD-10-CM

## 2021-07-22 DIAGNOSIS — Z7902 Long term (current) use of antithrombotics/antiplatelets: Secondary | ICD-10-CM

## 2021-07-22 DIAGNOSIS — R0600 Dyspnea, unspecified: Secondary | ICD-10-CM | POA: Diagnosis not present

## 2021-07-22 DIAGNOSIS — Z800 Family history of malignant neoplasm of digestive organs: Secondary | ICD-10-CM

## 2021-07-22 DIAGNOSIS — E1165 Type 2 diabetes mellitus with hyperglycemia: Secondary | ICD-10-CM

## 2021-07-22 DIAGNOSIS — R6520 Severe sepsis without septic shock: Secondary | ICD-10-CM | POA: Diagnosis present

## 2021-07-22 DIAGNOSIS — R6883 Chills (without fever): Secondary | ICD-10-CM | POA: Diagnosis not present

## 2021-07-22 DIAGNOSIS — M069 Rheumatoid arthritis, unspecified: Secondary | ICD-10-CM | POA: Diagnosis present

## 2021-07-22 DIAGNOSIS — R7881 Bacteremia: Secondary | ICD-10-CM | POA: Diagnosis not present

## 2021-07-22 DIAGNOSIS — G4733 Obstructive sleep apnea (adult) (pediatric): Secondary | ICD-10-CM | POA: Diagnosis present

## 2021-07-22 DIAGNOSIS — Z79899 Other long term (current) drug therapy: Secondary | ICD-10-CM

## 2021-07-22 DIAGNOSIS — D72829 Elevated white blood cell count, unspecified: Secondary | ICD-10-CM | POA: Diagnosis present

## 2021-07-22 DIAGNOSIS — E890 Postprocedural hypothyroidism: Secondary | ICD-10-CM | POA: Diagnosis not present

## 2021-07-22 DIAGNOSIS — A419 Sepsis, unspecified organism: Secondary | ICD-10-CM | POA: Diagnosis not present

## 2021-07-22 DIAGNOSIS — N132 Hydronephrosis with renal and ureteral calculous obstruction: Secondary | ICD-10-CM | POA: Diagnosis not present

## 2021-07-22 DIAGNOSIS — Z882 Allergy status to sulfonamides status: Secondary | ICD-10-CM

## 2021-07-22 DIAGNOSIS — E1043 Type 1 diabetes mellitus with diabetic autonomic (poly)neuropathy: Secondary | ICD-10-CM | POA: Diagnosis not present

## 2021-07-22 DIAGNOSIS — Z6372 Alcoholism and drug addiction in family: Secondary | ICD-10-CM

## 2021-07-22 DIAGNOSIS — R110 Nausea: Secondary | ICD-10-CM | POA: Diagnosis not present

## 2021-07-22 DIAGNOSIS — Z8616 Personal history of COVID-19: Secondary | ICD-10-CM | POA: Diagnosis not present

## 2021-07-22 DIAGNOSIS — R0602 Shortness of breath: Secondary | ICD-10-CM | POA: Diagnosis not present

## 2021-07-22 HISTORY — PX: CYSTOSCOPY WITH STENT PLACEMENT: SHX5790

## 2021-07-22 LAB — URINALYSIS, COMPLETE (UACMP) WITH MICROSCOPIC
Bilirubin Urine: NEGATIVE
Glucose, UA: >=500 mg/dL — AB
Ketones, ur: 5 mg/dL — AB
Nitrite: NEGATIVE
Protein, ur: 100 mg/dL — AB
Specific Gravity, Urine: 1.011 (ref 1.005–1.030)
WBC, UA: >50 WBC/hpf — ABNORMAL HIGH (ref 0–5)
pH: 5 (ref 5.0–8.0)

## 2021-07-22 LAB — CBC WITH DIFFERENTIAL/PLATELET
Abs Immature Granulocytes: 0.19 10*3/uL — ABNORMAL HIGH (ref 0.00–0.07)
Basophils Absolute: 0.1 10*3/uL (ref 0.0–0.1)
Basophils Relative: 0 %
Eosinophils Absolute: 0 10*3/uL (ref 0.0–0.5)
Eosinophils Relative: 0 %
HCT: 35.5 % — ABNORMAL LOW (ref 36.0–46.0)
Hemoglobin: 11.7 g/dL — ABNORMAL LOW (ref 12.0–15.0)
Immature Granulocytes: 1 %
Lymphocytes Relative: 5 %
Lymphs Abs: 0.9 10*3/uL (ref 0.7–4.0)
MCH: 29 pg (ref 26.0–34.0)
MCHC: 33 g/dL (ref 30.0–36.0)
MCV: 87.9 fL (ref 80.0–100.0)
Monocytes Absolute: 1.5 10*3/uL — ABNORMAL HIGH (ref 0.1–1.0)
Monocytes Relative: 8 %
Neutro Abs: 15.6 10*3/uL — ABNORMAL HIGH (ref 1.7–7.7)
Neutrophils Relative %: 86 %
Platelets: 225 10*3/uL (ref 150–400)
RBC: 4.04 MIL/uL (ref 3.87–5.11)
RDW: 14 % (ref 11.5–15.5)
WBC: 18.2 10*3/uL — ABNORMAL HIGH (ref 4.0–10.5)
nRBC: 0 % (ref 0.0–0.2)

## 2021-07-22 LAB — LACTIC ACID, PLASMA: Lactic Acid, Venous: 3.7 mmol/L (ref 0.5–1.9)

## 2021-07-22 LAB — PROTIME-INR
INR: 1.2 (ref 0.8–1.2)
Prothrombin Time: 15 seconds (ref 11.4–15.2)

## 2021-07-22 LAB — CBG MONITORING, ED: Glucose-Capillary: 177 mg/dL — ABNORMAL HIGH (ref 70–99)

## 2021-07-22 LAB — RESP PANEL BY RT-PCR (FLU A&B, COVID) ARPGX2
Influenza A by PCR: NEGATIVE
Influenza B by PCR: NEGATIVE
SARS Coronavirus 2 by RT PCR: NEGATIVE

## 2021-07-22 LAB — COMPREHENSIVE METABOLIC PANEL
ALT: 20 U/L (ref 0–44)
AST: 29 U/L (ref 15–41)
Albumin: 3.6 g/dL (ref 3.5–5.0)
Alkaline Phosphatase: 69 U/L (ref 38–126)
Anion gap: 12 (ref 5–15)
BUN: 17 mg/dL (ref 6–20)
CO2: 20 mmol/L — ABNORMAL LOW (ref 22–32)
Calcium: 9 mg/dL (ref 8.9–10.3)
Chloride: 103 mmol/L (ref 98–111)
Creatinine, Ser: 1.16 mg/dL — ABNORMAL HIGH (ref 0.44–1.00)
GFR, Estimated: 55 mL/min — ABNORMAL LOW (ref >60)
Glucose, Bld: 208 mg/dL — ABNORMAL HIGH (ref 70–99)
Potassium: 2.5 mmol/L — CL (ref 3.5–5.1)
Sodium: 135 mmol/L (ref 135–145)
Total Bilirubin: 0.8 mg/dL (ref 0.3–1.2)
Total Protein: 6.7 g/dL (ref 6.5–8.1)

## 2021-07-22 LAB — APTT: aPTT: 31 seconds (ref 24–36)

## 2021-07-22 LAB — MAGNESIUM: Magnesium: 1.7 mg/dL (ref 1.7–2.4)

## 2021-07-22 SURGERY — CYSTOSCOPY, WITH STENT INSERTION
Anesthesia: General | Laterality: Right

## 2021-07-22 MED ORDER — LATANOPROST 0.005 % OP SOLN
1.0000 [drp] | Freq: Every day | OPHTHALMIC | Status: DC
  Filled 2021-07-22: qty 2.5

## 2021-07-22 MED ORDER — ENOXAPARIN SODIUM 40 MG/0.4ML IJ SOSY
40.0000 mg | PREFILLED_SYRINGE | INTRAMUSCULAR | Status: DC
  Administered 2021-07-23: 18:00:00 40 mg via SUBCUTANEOUS
  Filled 2021-07-22: qty 0.4

## 2021-07-22 MED ORDER — ACETAMINOPHEN 325 MG PO TABS: 650.0000 mg | ORAL_TABLET | Freq: Four times a day (QID) | ORAL | Status: DC | PRN

## 2021-07-22 MED ORDER — BUDESONIDE 0.25 MG/2ML IN SUSP
0.2500 mg | Freq: Two times a day (BID) | RESPIRATORY_TRACT | Status: DC
  Administered 2021-07-23 – 2021-07-25 (×5): 0.25 mg via RESPIRATORY_TRACT
  Filled 2021-07-22 (×5): qty 2

## 2021-07-22 MED ORDER — ALBUTEROL SULFATE (2.5 MG/3ML) 0.083% IN NEBU: 2.5000 mg | INHALATION_SOLUTION | RESPIRATORY_TRACT | Status: DC | PRN

## 2021-07-22 MED ORDER — SODIUM CHLORIDE 0.9 % IV SOLN
2.0000 g | Freq: Once | INTRAVENOUS | Status: AC
  Administered 2021-07-22: 2 g via INTRAVENOUS
  Filled 2021-07-22: qty 2

## 2021-07-22 MED ORDER — PREGABALIN 75 MG PO CAPS: 75.0000 mg | ORAL_CAPSULE | Freq: Three times a day (TID) | ORAL | Status: DC

## 2021-07-22 MED ORDER — DEXAMETHASONE SODIUM PHOSPHATE 10 MG/ML IJ SOLN
INTRAMUSCULAR | Status: AC
  Filled 2021-07-22: qty 1

## 2021-07-22 MED ORDER — MONTELUKAST SODIUM 10 MG PO TABS
10.0000 mg | ORAL_TABLET | Freq: Every day | ORAL | Status: DC
  Administered 2021-07-23 – 2021-07-24 (×2): 10 mg via ORAL
  Filled 2021-07-22 (×2): qty 1

## 2021-07-22 MED ORDER — GLYCOPYRROLATE 0.2 MG/ML IJ SOLN
INTRAMUSCULAR | Status: AC
  Filled 2021-07-22: qty 1

## 2021-07-22 MED ORDER — PANTOPRAZOLE SODIUM 40 MG PO TBEC
40.0000 mg | DELAYED_RELEASE_TABLET | Freq: Every day | ORAL | Status: DC
  Administered 2021-07-23 – 2021-07-25 (×3): 40 mg via ORAL
  Filled 2021-07-22 (×3): qty 1

## 2021-07-22 MED ORDER — ONDANSETRON HCL 4 MG PO TABS: 4.0000 mg | ORAL_TABLET | Freq: Four times a day (QID) | ORAL | Status: DC | PRN

## 2021-07-22 MED ORDER — MAGNESIUM HYDROXIDE 400 MG/5ML PO SUSP: 30.0000 mL | Freq: Every day | ORAL | Status: DC | PRN

## 2021-07-22 MED ORDER — ACETAMINOPHEN 500 MG PO TABS
1000.0000 mg | ORAL_TABLET | Freq: Once | ORAL | Status: AC
  Administered 2021-07-22: 1000 mg via ORAL
  Filled 2021-07-22: qty 2

## 2021-07-22 MED ORDER — VITAMIN D (ERGOCALCIFEROL) 1.25 MG (50000 UNIT) PO CAPS: 50000.0000 [IU] | ORAL_CAPSULE | ORAL | Status: DC

## 2021-07-22 MED ORDER — LIDOCAINE HCL (PF) 2 % IJ SOLN
INTRAMUSCULAR | Status: AC
  Filled 2021-07-22: qty 5

## 2021-07-22 MED ORDER — FAMOTIDINE 20 MG PO TABS
40.0000 mg | ORAL_TABLET | Freq: Every day | ORAL | Status: DC
  Administered 2021-07-23 – 2021-07-24 (×2): 40 mg via ORAL
  Filled 2021-07-22 (×2): qty 2

## 2021-07-22 MED ORDER — SERTRALINE HCL 50 MG PO TABS
25.0000 mg | ORAL_TABLET | Freq: Every day | ORAL | Status: DC
  Administered 2021-07-23 – 2021-07-25 (×3): 25 mg via ORAL
  Filled 2021-07-22 (×3): qty 1

## 2021-07-22 MED ORDER — SUCCINYLCHOLINE CHLORIDE 200 MG/10ML IV SOSY
PREFILLED_SYRINGE | INTRAVENOUS | Status: AC
  Filled 2021-07-22: qty 10

## 2021-07-22 MED ORDER — POTASSIUM CHLORIDE 10 MEQ/100ML IV SOLN
10.0000 meq | Freq: Once | INTRAVENOUS | Status: AC
Start: 2021-07-22 — End: 2021-07-22
  Administered 2021-07-22: 10 meq via INTRAVENOUS
  Filled 2021-07-22: qty 100

## 2021-07-22 MED ORDER — ONDANSETRON HCL 4 MG/2ML IJ SOLN: 4.0000 mg | Freq: Four times a day (QID) | INTRAMUSCULAR | Status: DC | PRN

## 2021-07-22 MED ORDER — FOLIC ACID 1 MG PO TABS
1.0000 mg | ORAL_TABLET | Freq: Every day | ORAL | Status: DC
  Administered 2021-07-23 – 2021-07-25 (×3): 1 mg via ORAL
  Filled 2021-07-22 (×3): qty 1

## 2021-07-22 MED ORDER — ONDANSETRON HCL 4 MG/2ML IJ SOLN
INTRAMUSCULAR | Status: AC
  Filled 2021-07-22: qty 2

## 2021-07-22 MED ORDER — FENTANYL CITRATE (PF) 100 MCG/2ML IJ SOLN
INTRAMUSCULAR | Status: AC
  Filled 2021-07-22: qty 2

## 2021-07-22 MED ORDER — LEVOTHYROXINE SODIUM 50 MCG PO TABS
125.0000 ug | ORAL_TABLET | Freq: Every day | ORAL | Status: DC
  Administered 2021-07-23 – 2021-07-25 (×3): 125 ug via ORAL
  Filled 2021-07-22 (×3): qty 3

## 2021-07-22 MED ORDER — ROSUVASTATIN CALCIUM 20 MG PO TABS
20.0000 mg | ORAL_TABLET | Freq: Every day | ORAL | Status: DC
  Administered 2021-07-24: 22:00:00 20 mg via ORAL
  Filled 2021-07-22: qty 1

## 2021-07-22 MED ORDER — VANCOMYCIN HCL 1750 MG/350ML IV SOLN
1750.0000 mg | Freq: Once | INTRAVENOUS | Status: AC
Start: 2021-07-22 — End: 2021-07-23
  Administered 2021-07-22: 1750 mg via INTRAVENOUS
  Filled 2021-07-22: qty 350

## 2021-07-22 MED ORDER — PROPOFOL 500 MG/50ML IV EMUL
INTRAVENOUS | Status: AC
  Filled 2021-07-22: qty 50

## 2021-07-22 MED ORDER — SODIUM CHLORIDE 0.9 % IV BOLUS (SEPSIS)
1000.0000 mL | Freq: Once | INTRAVENOUS | Status: AC
  Administered 2021-07-22: 1000 mL via INTRAVENOUS

## 2021-07-22 MED ORDER — ACETAMINOPHEN 650 MG RE SUPP: 650.0000 mg | Freq: Four times a day (QID) | RECTAL | Status: DC | PRN

## 2021-07-22 MED ORDER — METRONIDAZOLE 500 MG/100ML IV SOLN
500.0000 mg | Freq: Once | INTRAVENOUS | Status: AC
  Administered 2021-07-22: 500 mg via INTRAVENOUS
  Filled 2021-07-22: qty 100

## 2021-07-22 MED ORDER — POTASSIUM CHLORIDE IN NACL 40-0.9 MEQ/L-% IV SOLN
INTRAVENOUS | Status: DC
  Filled 2021-07-22 (×2): qty 1000

## 2021-07-22 MED ORDER — TRAZODONE HCL 50 MG PO TABS
25.0000 mg | ORAL_TABLET | Freq: Every evening | ORAL | Status: DC | PRN
  Administered 2021-07-24: 25 mg via ORAL
  Filled 2021-07-22 (×2): qty 1

## 2021-07-22 MED ORDER — ALPRAZOLAM 0.5 MG PO TABS
0.5000 mg | ORAL_TABLET | Freq: Three times a day (TID) | ORAL | Status: DC | PRN
  Administered 2021-07-23 – 2021-07-24 (×2): 1 mg via ORAL
  Filled 2021-07-22 (×2): qty 2

## 2021-07-22 MED ORDER — DULOXETINE HCL 30 MG PO CPEP
30.0000 mg | ORAL_CAPSULE | Freq: Every day | ORAL | Status: DC
  Administered 2021-07-25: 30 mg via ORAL
  Filled 2021-07-22 (×3): qty 1

## 2021-07-22 MED ORDER — MIDAZOLAM HCL 2 MG/2ML IJ SOLN
INTRAMUSCULAR | Status: AC
  Filled 2021-07-22: qty 2

## 2021-07-22 MED ORDER — LORATADINE 10 MG PO TABS
10.0000 mg | ORAL_TABLET | Freq: Every day | ORAL | Status: DC
  Administered 2021-07-23 – 2021-07-25 (×3): 10 mg via ORAL
  Filled 2021-07-22 (×3): qty 1

## 2021-07-22 MED ORDER — POTASSIUM CHLORIDE CRYS ER 20 MEQ PO TBCR
40.0000 meq | EXTENDED_RELEASE_TABLET | Freq: Once | ORAL | Status: AC
  Administered 2021-07-22: 40 meq via ORAL
  Filled 2021-07-22: qty 2

## 2021-07-22 MED ORDER — CLOPIDOGREL BISULFATE 75 MG PO TABS
75.0000 mg | ORAL_TABLET | Freq: Every day | ORAL | Status: DC
  Administered 2021-07-23 – 2021-07-25 (×3): 75 mg via ORAL
  Filled 2021-07-22 (×3): qty 1

## 2021-07-22 MED ORDER — ONDANSETRON HCL 4 MG/2ML IJ SOLN
4.0000 mg | Freq: Once | INTRAMUSCULAR | Status: AC
  Administered 2021-07-22: 4 mg via INTRAVENOUS
  Filled 2021-07-22: qty 2

## 2021-07-22 MED ORDER — VANCOMYCIN HCL IN DEXTROSE 1-5 GM/200ML-% IV SOLN
1000.0000 mg | Freq: Once | INTRAVENOUS | Status: DC
  Filled 2021-07-22: qty 200

## 2021-07-22 MED ORDER — BUDESON-GLYCOPYRROL-FORMOTEROL 160-9-4.8 MCG/ACT IN AERO: 2.0000 | INHALATION_SPRAY | Freq: Two times a day (BID) | RESPIRATORY_TRACT | Status: DC

## 2021-07-22 MED ORDER — ROCURONIUM BROMIDE 10 MG/ML (PF) SYRINGE
PREFILLED_SYRINGE | INTRAVENOUS | Status: AC
  Filled 2021-07-22: qty 10

## 2021-07-22 MED ORDER — ESTRADIOL 1 MG PO TABS
0.5000 mg | ORAL_TABLET | Freq: Every day | ORAL | Status: DC
  Administered 2021-07-23 – 2021-07-25 (×3): 0.5 mg via ORAL
  Filled 2021-07-22 (×3): qty 0.5

## 2021-07-22 MED ORDER — METHOTREXATE 2.5 MG PO TABS: 15.0000 mg | ORAL_TABLET | ORAL | Status: DC

## 2021-07-22 MED ORDER — SODIUM CHLORIDE 0.9 % IV SOLN
2.0000 g | INTRAVENOUS | Status: DC
  Administered 2021-07-23 – 2021-07-25 (×3): 2 g via INTRAVENOUS
  Filled 2021-07-22 (×3): qty 2

## 2021-07-22 SURGICAL SUPPLY — 23 items
BAG DRAIN CYSTO-URO LG1000N (MISCELLANEOUS) ×2 IMPLANT
BRUSH SCRUB EZ 1% IODOPHOR (MISCELLANEOUS) IMPLANT
CATH URETL OPEN 5X70 (CATHETERS) ×2 IMPLANT
GAUZE 4X4 16PLY ~~LOC~~+RFID DBL (SPONGE) ×4 IMPLANT
GLOVE SURG UNDER POLY LF SZ7.5 (GLOVE) ×2 IMPLANT
GOWN STRL REUS W/ TWL LRG LVL3 (GOWN DISPOSABLE) ×1 IMPLANT
GOWN STRL REUS W/ TWL XL LVL3 (GOWN DISPOSABLE) ×1 IMPLANT
GOWN STRL REUS W/TWL LRG LVL3 (GOWN DISPOSABLE) ×2
GOWN STRL REUS W/TWL XL LVL3 (GOWN DISPOSABLE) ×2
GUIDEWIRE STR DUAL SENSOR (WIRE) ×2 IMPLANT
IV NS IRRIG 3000ML ARTHROMATIC (IV SOLUTION) ×2 IMPLANT
KIT TURNOVER CYSTO (KITS) ×2 IMPLANT
MANIFOLD NEPTUNE II (INSTRUMENTS) ×2 IMPLANT
PACK CYSTO AR (MISCELLANEOUS) ×2 IMPLANT
SET CYSTO W/LG BORE CLAMP LF (SET/KITS/TRAYS/PACK) ×2 IMPLANT
STENT URET 6FRX22 CONTOUR (STENTS) ×1 IMPLANT
STENT URET 6FRX24 CONTOUR (STENTS) IMPLANT
STENT URET 6FRX26 CONTOUR (STENTS) IMPLANT
SURGILUBE 2OZ TUBE FLIPTOP (MISCELLANEOUS) ×2 IMPLANT
SYR TOOMEY IRRIG 70ML (MISCELLANEOUS)
SYRINGE TOOMEY IRRIG 70ML (MISCELLANEOUS) IMPLANT
WATER STERILE IRR 1000ML POUR (IV SOLUTION) ×2 IMPLANT
WATER STERILE IRR 500ML POUR (IV SOLUTION) ×2 IMPLANT

## 2021-07-22 NOTE — Anesthesia Preprocedure Evaluation (Signed)
Anesthesia Evaluation  Patient identified by MRN, date of birth, ID band Patient awake    Reviewed: Allergy & Precautions, H&P , NPO status , Patient's Chart, lab work & pertinent test results, reviewed documented beta blocker date and time   History of Anesthesia Complications (+) PONV and history of anesthetic complications  Airway Mallampati: III  TM Distance: >3 FB Neck ROM: full    Dental  (+) Dental Advidsory Given, Missing, Poor Dentition   Pulmonary shortness of breath and with exertion, asthma , sleep apnea , COPD,  COPD inhaler, neg recent URI, former smoker,    Pulmonary exam normal breath sounds clear to auscultation       Cardiovascular Exercise Tolerance: Good hypertension, (-) angina+ Peripheral Vascular Disease and + DOE  (-) Past MI and (-) Cardiac Stents Normal cardiovascular exam(-) dysrhythmias (-) Valvular Problems/Murmurs Rhythm:regular Rate:Normal     Neuro/Psych neg Seizures PSYCHIATRIC DISORDERS Anxiety Depression  Neuromuscular disease (neuropathy)    GI/Hepatic Neg liver ROS, GERD  ,  Endo/Other  diabetes, Type 1, Insulin DependentHypothyroidism   Renal/GU Renal disease (kidney stones)  negative genitourinary   Musculoskeletal   Abdominal   Peds  Hematology negative hematology ROS (+)   Anesthesia Other Findings Past Medical History: No date: Anxiety No date: COPD (chronic obstructive pulmonary disease) (HCC) No date: Depression No date: DOE (dyspnea on exertion) No date: Gastroparesis No date: GERD (gastroesophageal reflux disease) No date: Glaucoma, both eyes 06/20/2020: History of 2019 novel coronavirus disease (COVID-19)     Comment:  hospital admission-- dx severe covid with pneumonia,               ARDS, DKA, COPD exaberation;  pt intubated 01-13th and               extubated 01-20th;   (11-15-2020  per pt did not go home               on oxygen, residual generalized weakness and  hair loss) No date: History of esophageal stricture     Comment:  post dilatation, followed by dr Henrene Pastor No date: History of Graves' disease     Comment:  s/p RAI  03-03-2010 No date: History of Helicobacter pylori infection     Comment:  remote hx and treatment No date: History of kidney stones No date: Hyperlipidemia No date: Hypertension     Comment:  followed by pcp 02/2010: Hypothyroidism, postradioiodine therapy     Comment:  followed by pcp No date: IDA (iron deficiency anemia) No date: Insulin pump in place     Comment:  w/ Novolog , managed by pcp No date: Moderate asthma     Comment:  followed by pcp,  (11-15-2020  per pt has pulmonology               appt on 11-17-2020)) No date: OSA (obstructive sleep apnea)     Comment:  11-15-2020  per pt has not used cpap since 05/ 2021                (previous followed by dr Brett Fairy,  study in epic               05-12-2018 moderate complex osa pt had used for awhile) No date: Osteoporosis No date: PAD (peripheral artery disease) ([place])     Comment:  followed by dr berry----  s/p bilateral common iliac               artery angioplasty stenting for  stenosis No date: Peripheral neuropathy No date: PONV (postoperative nausea and vomiting) No date: Right wrist fracture No date: Type 1 diabetes mellitus with long-term current use of  insulin (HCC)     Comment:  per pt dx at age 24 approx,  followed by pcp,                (11-15-2020  per pt last A1c 7.2 in 04/ 2022),  checks               blood sugar 6 to 10 times daily , fasting sugar-- 200 --               300) No date: Vitamin D deficiency   Reproductive/Obstetrics negative OB ROS                             Anesthesia Physical Anesthesia Plan  ASA: 3 and emergent  Anesthesia Plan: General   Post-op Pain Management:    Induction: Intravenous, Rapid sequence and Cricoid pressure planned  PONV Risk Score and Plan: 4 or greater and Propofol  infusion, TIVA, Ondansetron and Treatment may vary due to age or medical condition  Airway Management Planned: Oral ETT  Additional Equipment:   Intra-op Plan:   Post-operative Plan: Extubation in OR  Informed Consent: I have reviewed the patients History and Physical, chart, labs and discussed the procedure including the risks, benefits and alternatives for the proposed anesthesia with the patient or authorized representative who has indicated his/her understanding and acceptance.     Dental Advisory Given  Plan Discussed with: Anesthesiologist, CRNA and Surgeon  Anesthesia Plan Comments:         Anesthesia Quick Evaluation

## 2021-07-22 NOTE — ED Notes (Signed)
Critical K of 2.5 called from lab.  EDP Paduchowski notified.

## 2021-07-22 NOTE — ED Notes (Signed)
Pt taken to CT at this time.

## 2021-07-22 NOTE — Consult Note (Signed)
PHARMACY -  BRIEF ANTIBIOTIC NOTE   Pharmacy has received consult(s) for vancomycin and cefepime from an ED provider. The patient's profile has been reviewed for ht/wt/allergies/indication/available labs.    One time order(s) placed for cefepime 2 g and vancomycin 1750 mg IV   Further antibiotics/pharmacy consults should be ordered by admitting physician if indicated.                       Thank you, Darnelle Bos, PharmD 07/22/2021  8:52 PM

## 2021-07-22 NOTE — Sepsis Progress Note (Signed)
Elink following code sepsis °

## 2021-07-22 NOTE — ED Triage Notes (Signed)
Pt arrives GMS, C/O weakness, chills, nausea, SHOB, polyuria x 1 day.  Pt symptoms persist,  Aox4, denies pain at this time.

## 2021-07-22 NOTE — ED Provider Notes (Signed)
[facility] Provider Note    Event Date/Time   First MD Initiated Contact with Patient 07/22/21 2025     (approximate)  History   Chief Complaint: Weakness (W/SHOB)  HPI  Ashley [name] is a 56 y.o. female with a past medical history of hypertension, hyperlipidemia, COPD, diabetes, presents to the emergency department for weakness chills fatigue.  According to the patient over the past 2 weeks she has had frequent urination, completed a course of Keflex has developed pain in her back, states it feels like a kidney stone but states the pain is bilateral in the back.  Patient denies any dysuria but states very frequent urination feels like a urinary tract infection which she has had multiple times in the past.  She states today she has been feeling extremely fatigued and weak with chills and body aches.  Patient found to be febrile to 101.3 in the emergency department.  Patient denies any vomiting does state nausea.  No cough congestion.  No shortness of breath.  Physical Exam   Triage Vital Signs: ED Triage Vitals  Enc Vitals Group     BP 07/22/21 2009 113/65     Pulse Rate 07/22/21 2009 (!) 109     Resp 07/22/21 2009 18     Temp 07/22/21 2009 99 F (37.2 C)     Temp Source 07/22/21 2009 Oral     SpO2 07/22/21 2001 94 %     Weight 07/22/21 2012 180 lb 4.8 oz (81.8 kg)     Height 07/22/21 2012 4\' 11"  (1.499 m)     Head Circumference --      Peak Flow --      Pain Score 07/22/21 2011 0     Pain Loc --      Pain Edu? --      Excl. in [place]? --     Most recent vital signs: Vitals:   07/22/21 2009 07/22/21 2021  BP: 113/65   Pulse: (!) 109   Resp: 18   Temp: 99 F (37.2 C) (!) 101.3 F (38.5 C)  SpO2: 97%     General: Awake, no distress.  CV:  Good peripheral perfusion.  Regular rate and rhythm around 100 bpm Resp:  Normal effort.  Equal breath sounds bilaterally.  Abd:  No distention.  Soft, nontender.  No rebound or guarding.   ED Results /  Procedures / Treatments   EKG  EKG viewed and interpreted by myself shows sinus tachycardia at 110 bpm with a narrow QRS, normal axis, normal intervals, no concerning but nonspecific ST findings.  RADIOLOGY  I have personally reviewed the chest x-ray images, no acute findings on my evaluation. Chest x-ray read as negative. CT scan shows 3 right-sided ureteral stones resulting in hydroureteronephrosis.   MEDICATIONS ORDERED IN ED: Medications  sodium chloride 0.9 % bolus 1,000 mL (has no administration in time range)  ceFEPIme (MAXIPIME) 2 g in sodium chloride 0.9 % 100 mL IVPB (has no administration in time range)  metroNIDAZOLE (FLAGYL) IVPB 500 mg (has no administration in time range)  vancomycin (VANCOCIN) IVPB 1000 mg/200 mL premix (has no administration in time range)     IMPRESSION / MDM / ASSESSMENT AND PLAN / ED COURSE  I reviewed the triage vital signs and the nursing notes.  Patient presents to the emergency department for fever weakness chills body aches.  Patient found to be febrile and tachycardic meeting sepsis criteria given the polyuria likely urinary tract infection.  Differential would also include other infectious etiologies such as COVID, flu, pneumonia.  We will check labs, chest x-ray, urinalysis, urine culture blood cultures.  We will start the patient on broad-spectrum antibiotics and IV hydration.  We will treat her fever with Tylenol, nausea with Zofran while awaiting further results.  Patient agreeable to plan of care.  Given the patient's complaint of back pain we will obtain a CT renal scan.  Concern for ureterolithiasis first pyelonephritis.  Reassuringly patient has a benign abdominal exam.  Patient's labs have resulted showing significant leukocytosis of 18,000 and elevated lactate of 3.7.  Patient's chemistry is resulted showing hypokalemia to 2.5 we will replete with oral and IV potassium.  Urinalysis has resulted showing a large amount of leukocytes  greater than 50 with white blood cell clumps and rare bacteria likely indicating urinary tract infection.  CT scan shows 3 stones in the right ureter leading to hydroureteronephrosis.  Given these findings I spoke to Dr. Caprice Beaver of urology who will arrange for the patient to have a ureteral stent placed tonight.  I spoke to Dr. Sidney Ace of the hospitalist team who will be admitting to his service for further treatment.  Patient receiving IV antibiotics and fluids.  CRITICAL CARE Performed by: Harvest Dark   Total critical care time: 30 minutes  Critical care time was exclusive of separately billable procedures and treating other patients.  Critical care was necessary to treat or prevent imminent or life-threatening deterioration.  Critical care was time spent personally by me on the following activities: development of treatment plan with patient and/or surrogate as well as nursing, discussions with consultants, evaluation of patient's response to treatment, examination of patient, obtaining history from patient or surrogate, ordering and performing treatments and interventions, ordering and review of laboratory studies, ordering and review of radiographic studies, pulse oximetry and re-evaluation of patient's condition.   FINAL CLINICAL IMPRESSION(S) / ED DIAGNOSES   Sepsis Urinary tract infection Ureterolithiasis   Note:  This document was prepared using Dragon voice recognition software and may include unintentional dictation errors.   Harvest Dark, MD 07/22/21 2253

## 2021-07-22 NOTE — Consult Note (Signed)
CODE SEPSIS - PHARMACY COMMUNICATION  **Broad Spectrum Antibiotics should be administered within 1 hour of Sepsis diagnosis**  Time Code Sepsis Called/Page Received: 2026  Antibiotics Ordered: 2026  Time of 1st antibiotic administration: 2057  Additional action taken by pharmacy: N/A  If necessary, Name of Provider/Nurse Contacted: N/A    Darnelle Bos ,PharmD Clinical Pharmacist  07/22/2021  8:47 PM

## 2021-07-22 NOTE — Consult Note (Signed)
Urology Consult   I have been asked to see the patient by Dr. Sidney Ace, for evaluation and management of right ureteral stone with UTI and sepsis from urinary source.  Chief Complaint: Right-sided flank pain, fevers  HPI:  Ashley [name] is a 56 y.o. year old female with history notable for obesity, COPD, diabetes who presented to the ER tonight with about 2 weeks of UTI type symptoms with urinary frequency and dysuria, as well as back pain that radiates from the right side to the front.  She was treated as an outpatient with Keflex with only minimal improvement in her symptoms.  She has had persistent fevers to 103 over the last few days at home.  She denies any chest pain.  No aggravating or alleviating factors.  Severity is moderate to severe.  CT in the ER showed an 8 mm right distal ureteral stone with significant upstream hydronephrosis, no left-sided hydronephrosis.  Urinalysis consistent with infection and lactate elevated to 3.7.  PMH: Past Medical History:  Diagnosis Date   Anxiety    COPD (chronic obstructive pulmonary disease) ([place])    Depression    DOE (dyspnea on exertion)    Gastroparesis    GERD (gastroesophageal reflux disease)    Glaucoma, both eyes    History of 2019 novel coronavirus disease (COVID-19) 06/20/2020   hospital admission-- dx severe covid with pneumonia, ARDS, DKA, COPD exaberation;  pt intubated 01-13th and extubated 01-20th;   (11-15-2020  per pt did not go home on oxygen, residual generalized weakness and hair loss)   History of esophageal stricture    post dilatation, followed by dr Henrene Pastor   History of Graves' disease    s/p RAI  [id number]   History of Helicobacter pylori infection    remote hx and treatment   History of kidney stones    Hyperlipidemia    Hypertension    followed by pcp   Hypothyroidism, postradioiodine therapy 02/2010   followed by pcp   IDA (iron deficiency anemia)    Insulin pump in place    w/ Novolog , managed by  pcp   Moderate asthma    followed by pcp,  (11-15-2020  per pt has pulmonology appt on 11-17-2020))   OSA (obstructive sleep apnea)    11-15-2020  per pt has not used cpap since 05/ 2021  (previous followed by dr Brett Fairy,  study in epic 05-12-2018 moderate complex osa pt had used for awhile)   Osteoporosis    PAD (peripheral artery disease) ([place])    followed by dr berry----  s/p bilateral common iliac artery angioplasty stenting for stenosis   Peripheral neuropathy    PONV (postoperative nausea and vomiting)    Right wrist fracture    Type 1 diabetes mellitus with long-term current use of insulin ([place])    per pt dx at age 50 approx,  followed by pcp,  (11-15-2020  per pt last A1c 7.2 in 04/ 2022),  checks blood sugar 6 to 10 times daily , fasting sugar-- 200 -- 300)   Vitamin D deficiency     Surgical History: Past Surgical History:  Procedure Laterality Date   ABDOMINAL AORTOGRAM W/LOWER EXTREMITY Bilateral 01/01/2019   Procedure: ABDOMINAL AORTOGRAM W/LOWER EXTREMITY;  Surgeon: Lorretta Harp, MD;  Location: [facility];  Service: Cardiovascular;  Laterality: Bilateral;   BREAST EXCISIONAL BIOPSY Left 04-*02-2000  @[facility]    per pt benign   CATARACT EXTRACTION W/ INTRAOCULAR LENS  IMPLANT, BILATERAL  11/2019   COLONOSCOPY  last one 2019 approx   LAPAROSCOPIC ASSISTED VAGINAL HYSTERECTOMY  06-22-2004 @[facility]    NASAL SEPTOPLASTY W/ TURBINOPLASTY Bilateral 09/07/2013   Procedure: SEPTOPLASTY, BILATERAL TURBINATE RESECTION ;  Surgeon: Ascencion Dike, MD;  Location: [place];  Service: ENT;  Laterality: Bilateral;   OPEN REDUCTION INTERNAL FIXATION (ORIF) DISTAL RADIAL FRACTURE Right 11/16/2020   Procedure: OPEN REDUCTION INTERNAL FIXATION (ORIF) DISTAL RADIAL FRACTURE;  Surgeon: Hiram Gash, MD;  Location: [place];  Service: Orthopedics;  Laterality: Right;   PERIPHERAL VASCULAR INTERVENTION Bilateral 01/01/2019   Procedure: PERIPHERAL VASCULAR  INTERVENTION;  Surgeon: Lorretta Harp, MD;  Location: [place] [facility];  Service: Cardiovascular;  Laterality: Bilateral;   UPPER GASTROINTESTINAL ENDOSCOPY  last one 07-23-2019  dr Henrene Pastor     Allergies:  Allergies  Allergen Reactions   Mirtazapine Hives and Rash   Clindamycin Hives   Macrobid [Nitrofurantoin] Hives   Other Hives    Tylox    Sulfonamide Derivatives Hives   Alendronate Sodium Other (See Comments)   Lipitor [Atorvastatin] Other (See Comments)    MUSCLE ACHES   Restasis [Cyclosporine]     Pt stated, "My eyes turned red on the inside and outside of eye; burning sensation"   Sulfa Antibiotics Other (See Comments)   Topamax [Topiramate] Itching    Family History: Family History  Problem Relation Age of Onset   Diabetes Father    Heart attack Father    Alcoholism Father    Lung cancer Mother    Liver cancer Mother    Irritable bowel syndrome Sister    Colon cancer Neg Hx    Esophageal cancer Neg Hx    Rectal cancer Neg Hx    Stomach cancer Neg Hx     Social History:  reports that she quit smoking about 13 months ago. Her smoking use included cigarettes. She started smoking about 41 years ago. She has a 12.50 pack-year smoking history. She has never used smokeless tobacco. She reports that she does not drink alcohol and does not use drugs.  ROS: Negative aside from those stated in the HPI.  Physical Exam: BP 104/75    Pulse 92    Temp 98.8 F (37.1 C) (Oral)    Resp 16    Ht 4\' 11"  (1.499 m)    Wt 81.8 kg    SpO2 96%    BMI 36.42 kg/m    Constitutional: Appears uncomfortable, ill-appearing Cardiovascular: Tachycardic, regular rhythm Respiratory: Decreased at the bases bilaterally GI: Abdomen is soft, nontender, nondistended, no abdominal masses GU: Right CVA tenderness Lymph: No cervical or inguinal lymphadenopathy. Skin: No rashes, bruises or suspicious lesions. Neurologic: Grossly intact, no focal deficits, moving all 4 extremities. Psychiatric:  Normal mood and affect.  Laboratory Data: Reviewed in epic  Pertinent Imaging: I have personally reviewed the CT showing multiple right ureteral stones including a large 8 mm right distal ureteral stone with significant hydronephrosis, no left-sided hydronephrosis  Assessment & Plan:   Comorbid 56 year old female with 2 weeks of right-sided flank pain, UTI symptoms, and 1 week of fevers with work-up tonight showing large right distal ureteral stone with UTI and sepsis from urinary source.  We discussed the need for drainage in the setting of an infected and obstructed system.  A ureteral stent is a small plastic tube that is placed cystoscopically with one end in the kidney and the other end in the bladder that allows the infection from the kidney  to drain, and relieves pain from the obstructing stone.  We discussed the risks at length including bleeding, infection, sepsis, death, ureteral injury, and stent related symptoms including urgency/frequency/dysuria/flank pain/gross hematuria.  There is a low, but not 0, risk of inability to pass the ureteral stent alongside the stone from below which would require percutaneous nephrostomy tube by interventional radiology.  Finally, we discussed possible prolonged hospitalization and recovery, possible temporary Foley catheter placement, and 10 to 14-day course of antibiotics.  We reviewed the need for a follow-up procedure for definitive management of their stone when the infection has been treated in 2 to 3 weeks with ureteroscopy/laser lithotripsy.   Recommendations: OR tonight for cystoscopy and right ureteral stent placement Agree with admission to hospitalist for broad-spectrum antibiotics and resuscitation  [place], [place] [address] [place], [place] [address] [phone]

## 2021-07-22 NOTE — ED Notes (Signed)
Sister in law at bedside at this time.

## 2021-07-22 NOTE — ED Notes (Signed)
Critical Lactic Acid of 3.7 per lab.  EDP Paduchowski notified.

## 2021-07-23 ENCOUNTER — Inpatient Hospital Stay: Payer: Medicare HMO | Admitting: Certified Registered"

## 2021-07-23 DIAGNOSIS — N201 Calculus of ureter: Secondary | ICD-10-CM | POA: Diagnosis present

## 2021-07-23 DIAGNOSIS — N10 Acute tubulo-interstitial nephritis: Secondary | ICD-10-CM | POA: Diagnosis present

## 2021-07-23 DIAGNOSIS — N139 Obstructive and reflux uropathy, unspecified: Secondary | ICD-10-CM | POA: Diagnosis not present

## 2021-07-23 DIAGNOSIS — A419 Sepsis, unspecified organism: Secondary | ICD-10-CM | POA: Diagnosis not present

## 2021-07-23 DIAGNOSIS — D72829 Elevated white blood cell count, unspecified: Secondary | ICD-10-CM | POA: Diagnosis present

## 2021-07-23 DIAGNOSIS — R7881 Bacteremia: Secondary | ICD-10-CM

## 2021-07-23 DIAGNOSIS — Z794 Long term (current) use of insulin: Secondary | ICD-10-CM

## 2021-07-23 DIAGNOSIS — N179 Acute kidney failure, unspecified: Secondary | ICD-10-CM | POA: Diagnosis present

## 2021-07-23 DIAGNOSIS — B9620 Unspecified Escherichia coli [E. coli] as the cause of diseases classified elsewhere: Secondary | ICD-10-CM

## 2021-07-23 DIAGNOSIS — J449 Chronic obstructive pulmonary disease, unspecified: Secondary | ICD-10-CM | POA: Diagnosis present

## 2021-07-23 DIAGNOSIS — N1330 Unspecified hydronephrosis: Secondary | ICD-10-CM | POA: Diagnosis present

## 2021-07-23 DIAGNOSIS — E876 Hypokalemia: Secondary | ICD-10-CM | POA: Diagnosis present

## 2021-07-23 DIAGNOSIS — N390 Urinary tract infection, site not specified: Secondary | ICD-10-CM | POA: Diagnosis present

## 2021-07-23 DIAGNOSIS — E1165 Type 2 diabetes mellitus with hyperglycemia: Secondary | ICD-10-CM

## 2021-07-23 LAB — GLUCOSE, CAPILLARY
Glucose-Capillary: 313 mg/dL — ABNORMAL HIGH (ref 70–99)
Glucose-Capillary: 431 mg/dL — ABNORMAL HIGH (ref 70–99)
Glucose-Capillary: 349 mg/dL — ABNORMAL HIGH (ref 70–99)
Glucose-Capillary: 286 mg/dL — ABNORMAL HIGH (ref 70–99)
Glucose-Capillary: 293 mg/dL — ABNORMAL HIGH (ref 70–99)
Glucose-Capillary: 388 mg/dL — ABNORMAL HIGH (ref 70–99)
Glucose-Capillary: 292 mg/dL — ABNORMAL HIGH (ref 70–99)

## 2021-07-23 LAB — CORTISOL-AM, BLOOD: Cortisol - AM: 11.4 ug/dL (ref 6.7–22.6)

## 2021-07-23 LAB — CBC
HCT: 32.1 % — ABNORMAL LOW (ref 36.0–46.0)
Hemoglobin: 10.3 g/dL — ABNORMAL LOW (ref 12.0–15.0)
MCH: 29.3 pg (ref 26.0–34.0)
MCHC: 32.1 g/dL (ref 30.0–36.0)
MCV: 91.5 fL (ref 80.0–100.0)
Platelets: 200 10*3/uL (ref 150–400)
RBC: 3.51 MIL/uL — ABNORMAL LOW (ref 3.87–5.11)
RDW: 14.4 % (ref 11.5–15.5)
WBC: 13.3 10*3/uL — ABNORMAL HIGH (ref 4.0–10.5)
nRBC: 0 % (ref 0.0–0.2)

## 2021-07-23 LAB — POTASSIUM
Potassium: 3.6 mmol/L (ref 3.5–5.1)
Potassium: 4.1 mmol/L (ref 3.5–5.1)
Potassium: 3.8 mmol/L (ref 3.5–5.1)
Potassium: 3.3 mmol/L — ABNORMAL LOW (ref 3.5–5.1)

## 2021-07-23 LAB — BLOOD CULTURE ID PANEL (REFLEXED) - BCID2

## 2021-07-23 LAB — BASIC METABOLIC PANEL
Anion gap: 7 (ref 5–15)
Anion gap: 9 (ref 5–15)
BUN: 18 mg/dL (ref 6–20)
BUN: 19 mg/dL (ref 6–20)
CO2: 18 mmol/L — ABNORMAL LOW (ref 22–32)
CO2: 19 mmol/L — ABNORMAL LOW (ref 22–32)
Calcium: 7.9 mg/dL — ABNORMAL LOW (ref 8.9–10.3)
Calcium: 8.8 mg/dL — ABNORMAL LOW (ref 8.9–10.3)
Chloride: 109 mmol/L (ref 98–111)
Chloride: 107 mmol/L (ref 98–111)
Creatinine, Ser: 1.04 mg/dL — ABNORMAL HIGH (ref 0.44–1.00)
Creatinine, Ser: 1.16 mg/dL — ABNORMAL HIGH (ref 0.44–1.00)
GFR, Estimated: >60 mL/min (ref >60)
GFR, Estimated: 55 mL/min — ABNORMAL LOW (ref >60)
Glucose, Bld: 458 mg/dL — ABNORMAL HIGH (ref 70–99)
Glucose, Bld: 386 mg/dL — ABNORMAL HIGH (ref 70–99)
Potassium: 6.2 mmol/L — ABNORMAL HIGH (ref 3.5–5.1)
Potassium: 4 mmol/L (ref 3.5–5.1)
Sodium: 134 mmol/L — ABNORMAL LOW (ref 135–145)
Sodium: 135 mmol/L (ref 135–145)

## 2021-07-23 LAB — PROTIME-INR
INR: 1.2 (ref 0.8–1.2)
Prothrombin Time: 15.3 seconds — ABNORMAL HIGH (ref 11.4–15.2)

## 2021-07-23 LAB — PROCALCITONIN: Procalcitonin: 7.77 ng/mL

## 2021-07-23 LAB — LACTIC ACID, PLASMA: Lactic Acid, Venous: 1.1 mmol/L (ref 0.5–1.9)

## 2021-07-23 LAB — CBG MONITORING, ED: Glucose-Capillary: 302 mg/dL — ABNORMAL HIGH (ref 70–99)

## 2021-07-23 LAB — GLUCOSE, RANDOM: Glucose, Bld: 321 mg/dL — ABNORMAL HIGH (ref 70–99)

## 2021-07-23 MED ORDER — MIDAZOLAM HCL 2 MG/2ML IJ SOLN
INTRAMUSCULAR | Status: DC | PRN
  Administered 2021-07-23: 2 mg via INTRAVENOUS

## 2021-07-23 MED ORDER — INSULIN ASPART 100 UNIT/ML IV SOLN
10.0000 [IU] | Freq: Once | INTRAVENOUS | Status: DC
  Filled 2021-07-23: qty 0.1

## 2021-07-23 MED ORDER — ONDANSETRON HCL 4 MG/2ML IJ SOLN
INTRAMUSCULAR | Status: DC | PRN
  Administered 2021-07-23: 4 mg via INTRAVENOUS

## 2021-07-23 MED ORDER — PHENYLEPHRINE 40 MCG/ML (10ML) SYRINGE FOR IV PUSH (FOR BLOOD PRESSURE SUPPORT)
PREFILLED_SYRINGE | INTRAVENOUS | Status: DC | PRN
  Administered 2021-07-23: 80 ug via INTRAVENOUS
  Administered 2021-07-23: 120 ug via INTRAVENOUS

## 2021-07-23 MED ORDER — METHOCARBAMOL 750 MG PO TABS
750.0000 mg | ORAL_TABLET | Freq: Once | ORAL | Status: AC
  Administered 2021-07-23: 03:00:00 750 mg via ORAL
  Filled 2021-07-23: qty 1

## 2021-07-23 MED ORDER — PROMETHAZINE HCL 25 MG/ML IJ SOLN: 6.2500 mg | INTRAMUSCULAR | Status: DC | PRN

## 2021-07-23 MED ORDER — DEXTROSE 50 % IV SOLN: 1.0000 | Freq: Once | INTRAVENOUS | Status: DC

## 2021-07-23 MED ORDER — ROCURONIUM BROMIDE 100 MG/10ML IV SOLN
INTRAVENOUS | Status: DC | PRN
  Administered 2021-07-23: 5 mg via INTRAVENOUS

## 2021-07-23 MED ORDER — PROPOFOL 500 MG/50ML IV EMUL
INTRAVENOUS | Status: DC | PRN
  Administered 2021-07-23: 150 ug/kg/min via INTRAVENOUS

## 2021-07-23 MED ORDER — UMECLIDINIUM BROMIDE 62.5 MCG/ACT IN AEPB
1.0000 | INHALATION_SPRAY | Freq: Every day | RESPIRATORY_TRACT | Status: DC
  Filled 2021-07-23: qty 7

## 2021-07-23 MED ORDER — LIDOCAINE HCL (CARDIAC) PF 100 MG/5ML IV SOSY
PREFILLED_SYRINGE | INTRAVENOUS | Status: DC | PRN
  Administered 2021-07-23: 100 mg via INTRAVENOUS

## 2021-07-23 MED ORDER — INSULIN DETEMIR 100 UNIT/ML ~~LOC~~ SOLN
16.0000 [IU] | Freq: Two times a day (BID) | SUBCUTANEOUS | Status: DC
  Administered 2021-07-23 – 2021-07-24 (×3): 16 [IU] via SUBCUTANEOUS
  Filled 2021-07-23 (×4): qty 0.16

## 2021-07-23 MED ORDER — INSULIN ASPART 100 UNIT/ML IJ SOLN: 0.0000 [IU] | Freq: Three times a day (TID) | INTRAMUSCULAR | Status: DC

## 2021-07-23 MED ORDER — PREGABALIN 75 MG PO CAPS
75.0000 mg | ORAL_CAPSULE | Freq: Two times a day (BID) | ORAL | Status: DC
  Administered 2021-07-23 – 2021-07-25 (×5): 75 mg via ORAL
  Filled 2021-07-23 (×5): qty 1

## 2021-07-23 MED ORDER — TRAMADOL HCL 50 MG PO TABS: 50.0000 mg | ORAL_TABLET | Freq: Four times a day (QID) | ORAL | Status: DC | PRN

## 2021-07-23 MED ORDER — SODIUM ZIRCONIUM CYCLOSILICATE 10 G PO PACK
10.0000 g | PACK | Freq: Two times a day (BID) | ORAL | Status: DC
  Filled 2021-07-23 (×2): qty 1

## 2021-07-23 MED ORDER — FENTANYL CITRATE (PF) 100 MCG/2ML IJ SOLN
INTRAMUSCULAR | Status: AC
  Filled 2021-07-23: qty 2

## 2021-07-23 MED ORDER — KETOROLAC TROMETHAMINE 15 MG/ML IJ SOLN
15.0000 mg | Freq: Four times a day (QID) | INTRAMUSCULAR | Status: DC | PRN
  Administered 2021-07-23: 21:00:00 15 mg via INTRAVENOUS
  Filled 2021-07-23: qty 1

## 2021-07-23 MED ORDER — ACETAMINOPHEN 650 MG RE SUPP
650.0000 mg | Freq: Four times a day (QID) | RECTAL | Status: DC | PRN
Start: 2021-07-23 — End: 2021-07-24

## 2021-07-23 MED ORDER — POTASSIUM CHLORIDE 20 MEQ PO PACK: 40.0000 meq | PACK | Freq: Once | ORAL | Status: DC

## 2021-07-23 MED ORDER — SODIUM CHLORIDE 0.9 % IV SOLN: INTRAVENOUS | Status: DC

## 2021-07-23 MED ORDER — MOMETASONE FURO-FORMOTEROL FUM 200-5 MCG/ACT IN AERO
2.0000 | INHALATION_SPRAY | Freq: Two times a day (BID) | RESPIRATORY_TRACT | Status: DC
  Administered 2021-07-24 (×2): 2 via RESPIRATORY_TRACT
  Filled 2021-07-23: qty 8.8

## 2021-07-23 MED ORDER — SUCCINYLCHOLINE CHLORIDE 200 MG/10ML IV SOSY
PREFILLED_SYRINGE | INTRAVENOUS | Status: DC | PRN
  Administered 2021-07-23: 140 mg via INTRAVENOUS

## 2021-07-23 MED ORDER — ACETAMINOPHEN 500 MG PO TABS
1000.0000 mg | ORAL_TABLET | Freq: Four times a day (QID) | ORAL | Status: DC | PRN
Start: 2021-07-23 — End: 2021-07-24
  Administered 2021-07-23 (×2): 1000 mg via ORAL
  Filled 2021-07-23 (×2): qty 2

## 2021-07-23 MED ORDER — LACTATED RINGERS IV SOLN: INTRAVENOUS | Status: DC | PRN

## 2021-07-23 MED ORDER — INSULIN ASPART 100 UNIT/ML IJ SOLN
0.0000 [IU] | Freq: Every day | INTRAMUSCULAR | Status: DC
  Administered 2021-07-23 – 2021-07-24 (×2): 3 [IU] via SUBCUTANEOUS
  Filled 2021-07-23 (×2): qty 1

## 2021-07-23 MED ORDER — DEXTROSE 10 % IV SOLN: Freq: Once | INTRAVENOUS | Status: DC

## 2021-07-23 MED ORDER — CALCIUM GLUCONATE 10 % IV SOLN
1.0000 g | Freq: Once | INTRAVENOUS | Status: DC
  Filled 2021-07-23: qty 10

## 2021-07-23 MED ORDER — PREGABALIN 75 MG PO CAPS
150.0000 mg | ORAL_CAPSULE | Freq: Every day | ORAL | Status: DC
  Administered 2021-07-23 – 2021-07-24 (×2): 150 mg via ORAL
  Filled 2021-07-23 (×2): qty 2

## 2021-07-23 MED ORDER — FENTANYL CITRATE (PF) 100 MCG/2ML IJ SOLN
INTRAMUSCULAR | Status: DC | PRN
  Administered 2021-07-23: 100 ug via INTRAVENOUS

## 2021-07-23 MED ORDER — CHLORHEXIDINE GLUCONATE CLOTH 2 % EX PADS
6.0000 | MEDICATED_PAD | Freq: Every day | CUTANEOUS | Status: DC
  Administered 2021-07-23 – 2021-07-25 (×2): 6 via TOPICAL

## 2021-07-23 MED ORDER — FENTANYL CITRATE (PF) 100 MCG/2ML IJ SOLN
25.0000 ug | INTRAMUSCULAR | Status: DC | PRN
  Administered 2021-07-23: 50 ug via INTRAVENOUS

## 2021-07-23 MED ORDER — INSULIN ASPART 100 UNIT/ML IJ SOLN
0.0000 [IU] | Freq: Three times a day (TID) | INTRAMUSCULAR | Status: DC
  Administered 2021-07-23: 20 [IU] via SUBCUTANEOUS
  Administered 2021-07-23: 10:00:00 11 [IU] via SUBCUTANEOUS
  Administered 2021-07-24: 7 [IU] via SUBCUTANEOUS
  Filled 2021-07-23 (×3): qty 1

## 2021-07-23 MED ORDER — PROPOFOL 10 MG/ML IV BOLUS
INTRAVENOUS | Status: DC | PRN
Start: 2021-07-23 — End: 2021-07-23
  Administered 2021-07-23: 150 mg via INTRAVENOUS

## 2021-07-23 MED ORDER — DEXAMETHASONE SODIUM PHOSPHATE 10 MG/ML IJ SOLN
INTRAMUSCULAR | Status: DC | PRN
  Administered 2021-07-23: 4 mg via INTRAVENOUS

## 2021-07-23 NOTE — Assessment & Plan Note (Addendum)
Severe sepsis - Presented with leukocytosis, tachypnea, tachycardia, lactic acidosis, acute kidney injury due to acute pyelonephritis. -Antibiotic plan as below. -Blood and urine cultures growing E. coli.   -Sepsis has resolved.

## 2021-07-23 NOTE — Progress Notes (Signed)
Cross Cover K 6.2, blood glucose running 400's. Patient has been managing blood sugars via insulin pump but is now requesting traditional monitoring and SQ insulin Diabetes coordinator requested for insulin dosing  Repeat BMP confirm hyperkalemia Patient without distress  and no T wave changes on tee. EKG is pending

## 2021-07-23 NOTE — Assessment & Plan Note (Addendum)
-   Resume methotrexate a week after discharge

## 2021-07-23 NOTE — Assessment & Plan Note (Signed)
-   Repeat a.m. labs °

## 2021-07-23 NOTE — Assessment & Plan Note (Addendum)
-   Currently stable.  Continue home regimen.

## 2021-07-23 NOTE — Sepsis Progress Note (Signed)
Notified bedside nurse of need to draw repeat lactic acid. Will continue to follow code sepsis.

## 2021-07-23 NOTE — Progress Notes (Signed)
Bedside glucose performed. Patient Type I Diabetic with insulin pump. Patient self-administered 14u bolus via pump.

## 2021-07-23 NOTE — Progress Notes (Signed)
PHARMACY - PHYSICIAN COMMUNICATION CRITICAL VALUE ALERT - BLOOD CULTURE IDENTIFICATION (BCID)  CODEE [name] is an 56 y.o. female who presented to [facility] on 07/22/2021 with a chief complaint of weakness, chills, and fatigue.  Assessment: Admitted with UTI. UA consistent with infection. CT showing right distal ureteral stone with hydronephrosis. Blood cultures growing GNR 1/4 anaerobic bottle. BCID detected E. Coli, no resistance.   Name of physician (or Provider) Contacted: Dr. Starla Link  Current antibiotics: ceftriaxone 2 grams every 24 hours  Changes to prescribed antibiotics recommended:  Patient is on recommended antibiotics - No changes needed  Results for orders placed or performed during the hospital encounter of 07/22/21  Blood Culture ID Panel (Reflexed) (Collected: 07/22/2021  8:06 PM)  Result Value Ref Range   Enterococcus faecalis NOT DETECTED NOT DETECTED   Enterococcus Faecium NOT DETECTED NOT DETECTED   Listeria monocytogenes NOT DETECTED NOT DETECTED   Staphylococcus species NOT DETECTED NOT DETECTED   Staphylococcus aureus (BCID) NOT DETECTED NOT DETECTED   Staphylococcus epidermidis NOT DETECTED NOT DETECTED   Staphylococcus lugdunensis NOT DETECTED NOT DETECTED   Streptococcus species NOT DETECTED NOT DETECTED   Streptococcus agalactiae NOT DETECTED NOT DETECTED   Streptococcus pneumoniae NOT DETECTED NOT DETECTED   Streptococcus pyogenes NOT DETECTED NOT DETECTED   A.calcoaceticus-baumannii NOT DETECTED NOT DETECTED   Bacteroides fragilis NOT DETECTED NOT DETECTED   Enterobacterales DETECTED (A) NOT DETECTED   Enterobacter cloacae complex NOT DETECTED NOT DETECTED   Escherichia coli DETECTED (A) NOT DETECTED   Klebsiella aerogenes NOT DETECTED NOT DETECTED   Klebsiella oxytoca NOT DETECTED NOT DETECTED   Klebsiella pneumoniae NOT DETECTED NOT DETECTED   Proteus species NOT DETECTED NOT DETECTED   Salmonella species NOT DETECTED NOT DETECTED   Serratia  marcescens NOT DETECTED NOT DETECTED   Haemophilus influenzae NOT DETECTED NOT DETECTED   Neisseria meningitidis NOT DETECTED NOT DETECTED   Pseudomonas aeruginosa NOT DETECTED NOT DETECTED   Stenotrophomonas maltophilia NOT DETECTED NOT DETECTED   Candida albicans NOT DETECTED NOT DETECTED   Candida auris NOT DETECTED NOT DETECTED   Candida glabrata NOT DETECTED NOT DETECTED   Candida krusei NOT DETECTED NOT DETECTED   Candida parapsilosis NOT DETECTED NOT DETECTED   Candida tropicalis NOT DETECTED NOT DETECTED   Cryptococcus neoformans/gattii NOT DETECTED NOT DETECTED   CTX-M ESBL NOT DETECTED NOT DETECTED   Carbapenem resistance IMP NOT DETECTED NOT DETECTED   Carbapenem resistance KPC NOT DETECTED NOT DETECTED   Carbapenem resistance NDM NOT DETECTED NOT DETECTED   Carbapenem resist OXA 48 LIKE NOT DETECTED NOT DETECTED   Carbapenem resistance VIM NOT DETECTED NOT DETECTED    Wynelle Cleveland, PharmD Pharmacy Resident  07/23/2021 9:40 AM

## 2021-07-23 NOTE — Hospital Course (Addendum)
56 year old female with h/o COPD, GERD, gastroparesis, anxiety, depression, hypertension, dyslipidemia and postablative hypothyroidism presented with weakness and shortness of breath.  On presentation, potassium was 2.5, creatinine of 1.16, WBCs of 18.2.  COVID-19 and influenza testing were negative.  UA was positive for UTI.  Chest x-ray was negative for acute cardiopulmonary disease.  CT renal stone study revealed right hydroureteronephrosis with ureterolithiasis.  She was started on IV fluids and antibiotics.  Urology was consulted.  She underwent cystoscopy and right ureteral stent placement by urology.  During the hospitalization, blood cultures grew E. coli.  She is tolerating diet and currently hemodynamically stable.  Foley catheter has been removed.  She will be discharged home today on oral Keflex with outpatient follow-up with PCP and urology.

## 2021-07-23 NOTE — Transfer of Care (Signed)
Immediate Anesthesia Transfer of Care Note  Patient: Ashley [name]  Procedure(s) Performed: CYSTOSCOPY WITH STENT PLACEMENT (Right)  Patient Location: PACU  Anesthesia Type:General  Level of Consciousness: drowsy and patient cooperative  Airway & Oxygen Therapy: Patient Spontanous Breathing and Patient connected to nasal cannula oxygen  Post-op Assessment: Report given to RN and Post -op Vital signs reviewed and stable  Post vital signs: Reviewed and stable  Last Vitals:  Vitals Value Taken Time  BP 111/77 07/23/21 0048  Temp 36.1 C 07/23/21 0048  Pulse 97 07/23/21 0052  Resp 19 07/23/21 0052  SpO2 97 % 07/23/21 0052  Vitals shown include unvalidated device data.  Last Pain:  Vitals:   07/23/21 0048  TempSrc:   PainSc: Asleep         Complications: No notable events documented.

## 2021-07-23 NOTE — Op Note (Signed)
Date of procedure: 07/23/21  Preoperative diagnosis:  Right distal ureteral stone Sepsis from urinary source  Postoperative diagnosis:  Same  Procedure: Cystoscopy, right ureteral stent placement  Surgeon: Nickolas Madrid, MD  Anesthesia: General  Complications: None  Intraoperative findings:  Normal cystoscopy Uncomplicated right ureteral stent placement with purulent drainage  EBL: None  Specimens: None  Drains: Right 6 French by 22 cm ureteral stent  Indication: Ashley [name] is a 56 y.o. patient with right-sided pain and fevers with work-up showing an 8 mm right distal ureteral stone and clinical and laboratory signs of sepsis from urinary source.  After reviewing the management options for treatment, they elected to proceed with the above surgical procedure(s). We have discussed the potential benefits and risks of the procedure, side effects of the proposed treatment, the likelihood of the patient achieving the goals of the procedure, and any potential problems that might occur during the procedure or recuperation. Informed consent has been obtained.  Description of procedure:  The patient was taken to the operating room and general anesthesia was induced. SCDs were placed for DVT prophylaxis. The patient was placed in the dorsal lithotomy position, prepped and draped in the usual sterile fashion, and preoperative antibiotics(cefepime and vancomycin in ER) were administered. A preoperative time-out was performed.   A 21 French rigid cystoscope was used to intubate the urethra and thorough cystoscopy was performed.  The bladder was grossly normal, and the ureteral orifices were orthotopic bilaterally.  A sensor wire was used to intubate the right ureteral orifice and advanced easily alongside the stone up to the kidney under fluoroscopic vision.  There was purulent drainage alongside the wire.  A 6 French by 22 cm ureteral stent was uneventfully passed over the wire with a  curl over the projected location of the kidney, as well as under direct vision the bladder.  Purulent urine drained through the side ports of the stent.  A 16 French Foley was placed to maximize drainage in the postoperative period, 10 mL were placed in the balloon.  Disposition: Stable to PACU  Plan: Admit to hospitalist for broad-spectrum antibiotics and resuscitation Foley can be removed when clinically improved Will need outpatient right ureteroscopy and laser lithotripsy in 3 to 4 weeks once infection treated  Nickolas Madrid, MD

## 2021-07-23 NOTE — Assessment & Plan Note (Signed)
Continue statin. 

## 2021-07-23 NOTE — Progress Notes (Signed)
° °  Subjective Denies flank pain this morning, continues to feel weak.  Overall improved from last night  Physical Exam: BP (!) 113/56 (BP Location: Left Arm)    Pulse 77    Temp 98.1 F (36.7 C)    Resp 18    Ht 4\' 11"  (1.499 m)    Wt 76 kg    SpO2 99%    BMI 33.84 kg/m    Constitutional:  Alert and oriented, No acute distress. Respiratory: Normal respiratory effort, no increased work of breathing. GI: Abdomen is soft, non-tender, non-distended Foley with clear yellow urine  Laboratory Data: Reviewed in epic WBC downtrending, lactate normalized   Assessment & Plan:   56 year old female with type I diabetes who presented with 8 mm right distal ureteral stone and sepsis from urinary source, POD#1 cystoscopy and right ureteral stent placement.  -Continue antibiotics, narrow as able pending cultures -Okay to remove Foley in the next 24 hours from urology perspective -Consider oxybutynin 10 mg XL daily if stent related symptoms of urgency/frequency/bladder spasm -Urology will coordinate outpatient right ureteroscopy/laser lithotripsy for stone removal after infection treated in 2 to 3 weeks  Total of 20 minutes was spent on the floor with greater than 50% spent in counseling and coordination of care with the patient regarding infected ureteral stone with sepsis from urinary source, and need for follow-up outpatient stone removal with ureteroscopy in 2 to 3 weeks after infection cleared.  Billey Co, MD

## 2021-07-23 NOTE — Progress Notes (Signed)
Patient was admitted earlier this morning for weakness and shortness of breath and was found to have right-sided hydroureteronephrosis with ureterolithiasis and UTI.  She was put on IV antibiotics.  She underwent cystoscopy and right ureteral stent placement by urology.  Patient seen and examined at bedside.  Discussed with her.  I have reviewed patient's medical records including this morning's H&P, current vitals, labs, medications myself.  Continue IV fluids and antibiotics.  Follow cultures.  Follow further urology recommendations.  Potassium earlier this morning was 6.2 but the repeat is 4.  Repeat a.m. labs.

## 2021-07-23 NOTE — Progress Notes (Signed)
Bedside glucose performed per request of NP who is at bedside evaluating patient. BS 349.

## 2021-07-23 NOTE — Progress Notes (Signed)
Patient arrived to floor via bed from PACU in stable condition with family member at bedside.

## 2021-07-23 NOTE — Assessment & Plan Note (Addendum)
-   Possibly from above.  Resolved.

## 2021-07-23 NOTE — Progress Notes (Addendum)
Inpatient Diabetes Program Recommendations  AACE/ADA: New Consensus Statement on Inpatient Glycemic Control (2015)  Target Ranges:  Prepandial:   less than 140 mg/dL      Peak postprandial:   less than 180 mg/dL (1-2 hours)      Critically ill patients:  140 - 180 mg/dL   Lab Results  Component Value Date   GLUCAP 286 (H) 07/23/2021   HGBA1C 8.2 (H) 06/21/2020    Review of Glycemic Control  Diabetes history: type 1 Outpatient Diabetes medications: [employer] insulin pump Current orders for Inpatient glycemic control: Novolog RESISTANT correction scale TID & Novolog 0-5 units HS scale  Inpatient Diabetes Program Recommendations:   Spoke with patient on the phne. States that she would like to use SQ insulin while in the hospital instead of her insulin pump.   She reported basal insulin pump settings: 12 am-2 am: 1.3 units/hr 2 am- 6 am: 1.4 units/hr 6 am- 3 pm: 1.8 units/hr 3 pm- 12 am: 1.5 units/hr     TOTAL basal: 37.90 units/24 hours  Recommend Levemir 16 units BID (37.90 units divided by 2= 18 units), continue Novolog RESISTANT correction scale TID and HS scale. May need to titrate dosages as needed. Patient states that she is not eating much right now.    Will continue to monitor blood sugars while in the hospital.  Harvel Ricks RN BSN CDE Diabetes Coordinator Pager: [phone]  8am-5pm

## 2021-07-23 NOTE — Assessment & Plan Note (Signed)
Continue levothyroxine 

## 2021-07-23 NOTE — Anesthesia Procedure Notes (Signed)
Procedure Name: Intubation Date/Time: 07/23/2021 12:15 AM Performed by: Orion Crook, CRNA Pre-anesthesia Checklist: Patient identified, Emergency Drugs available, Suction available and Patient being monitored Patient Re-evaluated:Patient Re-evaluated prior to induction Oxygen Delivery Method: Circle system utilized Preoxygenation: Pre-oxygenation with 100% oxygen Induction Type: IV induction Ventilation: Mask ventilation without difficulty Laryngoscope Size: 4 and McGraph Tube type: Oral Tube size: 7.0 mm Number of attempts: 1 Airway Equipment and Method: Stylet, Oral airway and Video-laryngoscopy Placement Confirmation: ETT inserted through vocal cords under direct vision, positive ETCO2 and breath sounds checked- equal and bilateral Tube secured with: Tape Dental Injury: Teeth and Oropharynx as per pre-operative assessment

## 2021-07-23 NOTE — Progress Notes (Signed)
Bedside glucose performed. Patient self-administered insulin via pump. Patient and family member request to have further insulin administered via staff via sliding scale. Patient will disconnect insulin pump for duration of hospital stay. Night coverage made aware.

## 2021-07-23 NOTE — Assessment & Plan Note (Signed)
Continue PPI ?

## 2021-07-23 NOTE — Progress Notes (Signed)
Cross Cover K 6.2, blood glucose running 400's. Patient has been managing blood sugars via insulin pump but is now requesting traditional monitoring and SQ insulin

## 2021-07-23 NOTE — H&P (Addendum)
[place]   PATIENT NAME: Ashley [name]    MR#:  [id number]  DATE OF BIRTH:  1965-11-04  DATE OF ADMISSION:  07/22/2021  PRIMARY CARE PHYSICIAN: Prince Solian, MD   Patient is coming from: Home  REQUESTING/REFERRING PHYSICIAN: Harvest Dark, MD  CHIEF COMPLAINT:   Chief Complaint  Patient presents with   Weakness    W/SHOB    HISTORY OF PRESENT ILLNESS:  Ashley [name] is a 56 y.o. female with medical history significant for COPD, GERD, gastroparesis, anxiety, depression, hypertension, dyslipidemia and postablative hypothyroidism, who presented to the emergency room with acute onset of fatigue and weakness over the last few days with occasional bilateral flank pain.  She stated that she felt she passed a couple stones during the last week.  She admits to urinary frequency and dysuria and has noted hematuria about a couple weeks ago.  She admits to current nausea and vomiting with lower abdominal pain.  No chest pain or palpitations.  No cough or wheezing or hemoptysis.  No chest pain or palpitations.  ED Course: Upon presentation to the emergency room heart rate was 109 and temperature 99 with otherwise normal vital signs.  Respiratory rate later on was 21.  Labs revealed hypokalemia of 2.5 and blood glucose of 208 with a creatinine 1.16.  CBC showed leukocytosis of 18.2 with neutrophilia and mild anemia influenza antigens and COVID-19 PCR came back negative.  UA was positive for UTI.  Urine culture and blood cultures were sent. EKG as reviewed by me : EKG showed sinus tachycardia with rate 110 with RSR-in. Imaging: Chest x-ray showed no acute cardiopulmonary disease. CT renal stone revealed the following: Hydroureteronephrosis on the right. Three stones in the distal ureter. 2 mm stone at the right UVJ. 7-8 mm stone just proximal to that. 2 mm stone just proximal to the left.   Nonobstructing 4 mm stone in the lower pole the right kidney.   Multiple  nonobstructing stones in the left kidney, the largest in the lower pole measuring 5 mm.   Aortic atherosclerosis.  Bilateral aortoiliac stents.  The patient was given IV Rocephin and Flagyl, 4 mg IV Zofran, 10 mill equivalent IV potassium chloride and 40 mEq p.o. potassium chloride as well as 1 L bolus of IV normal saline.  Dr. Diamantina Providence was notified about the patient and will take her to the OR for cysto-ureteroscopy and ureteral stent.  She will then be admitted to a medical telemetry bed for further evaluation and management.   PAST MEDICAL HISTORY:   Past Medical History:  Diagnosis Date   Anxiety    COPD (chronic obstructive pulmonary disease) (HCC)    Depression    DOE (dyspnea on exertion)    Gastroparesis    GERD (gastroesophageal reflux disease)    Glaucoma, both eyes    History of 2019 novel coronavirus disease (COVID-19) 06/20/2020   hospital admission-- dx severe covid with pneumonia, ARDS, DKA, COPD exaberation;  pt intubated 01-13th and extubated 01-20th;   (11-15-2020  per pt did not go home on oxygen, residual generalized weakness and hair loss)   History of esophageal stricture    post dilatation, followed by dr Henrene Pastor   History of Graves' disease    s/p RAI  [id number]   History of Helicobacter pylori infection    remote hx and treatment   History of kidney stones    Hyperlipidemia    Hypertension    followed by pcp   Hypothyroidism,  postradioiodine therapy 02/2010   followed by pcp   IDA (iron deficiency anemia)    Insulin pump in place    w/ Novolog , managed by pcp   Moderate asthma    followed by pcp,  (11-15-2020  per pt has pulmonology appt on 11-17-2020))   OSA (obstructive sleep apnea)    11-15-2020  per pt has not used cpap since 05/ 2021  (previous followed by dr Brett Fairy,  study in epic 05-12-2018 moderate complex osa pt had used for awhile)   Osteoporosis    PAD (peripheral artery disease) ([place])    followed by dr berry----  s/p bilateral common iliac  artery angioplasty stenting for stenosis   Peripheral neuropathy    PONV (postoperative nausea and vomiting)    Right wrist fracture    Type 1 diabetes mellitus with long-term current use of insulin ([place])    per pt dx at age 12 approx,  followed by pcp,  (11-15-2020  per pt last A1c 7.2 in 04/ 2022),  checks blood sugar 6 to 10 times daily , fasting sugar-- 200 -- 300)   Vitamin D deficiency     PAST SURGICAL HISTORY:   Past Surgical History:  Procedure Laterality Date   ABDOMINAL AORTOGRAM W/LOWER EXTREMITY Bilateral 01/01/2019   Procedure: ABDOMINAL AORTOGRAM W/LOWER EXTREMITY;  Surgeon: Lorretta Harp, MD;  Location: [facility];  Service: Cardiovascular;  Laterality: Bilateral;   BREAST EXCISIONAL BIOPSY Left 04-*02-2000  _0    per pt benign   CATARACT EXTRACTION W/ INTRAOCULAR LENS  IMPLANT, BILATERAL  11/2019   COLONOSCOPY  last one 2019 approx   LAPAROSCOPIC ASSISTED VAGINAL HYSTERECTOMY  06-22-2004 _1    NASAL SEPTOPLASTY W/ TURBINOPLASTY Bilateral 09/07/2013   Procedure: SEPTOPLASTY, BILATERAL TURBINATE RESECTION ;  Surgeon: Ascencion Dike, MD;  Location: [facility];  Service: ENT;  Laterality: Bilateral;   OPEN REDUCTION INTERNAL FIXATION (ORIF) DISTAL RADIAL FRACTURE Right 11/16/2020   Procedure: OPEN REDUCTION INTERNAL FIXATION (ORIF) DISTAL RADIAL FRACTURE;  Surgeon: Hiram Gash, MD;  Location: [facility];  Service: Orthopedics;  Laterality: Right;   PERIPHERAL VASCULAR INTERVENTION Bilateral 01/01/2019   Procedure: PERIPHERAL VASCULAR INTERVENTION;  Surgeon: Lorretta Harp, MD;  Location: [facility];  Service: Cardiovascular;  Laterality: Bilateral;   UPPER GASTROINTESTINAL ENDOSCOPY  last one 07-23-2019  dr Henrene Pastor    SOCIAL HISTORY:   Social History   Tobacco Use   Smoking status: Former    Packs/day: 0.50    Years: 25.00    Pack years: 12.50    Types: Cigarettes    Start date: 1982    Quit date: 06/20/2020    Years  since quitting: 1.0   Smokeless tobacco: Never   Tobacco comments:    Started smoking at age [age]  Substance Use Topics   Alcohol use: No    Alcohol/week: 0.0 standard drinks    FAMILY HISTORY:   Family History  Problem Relation Age of Onset   Diabetes Father    Heart attack Father    Alcoholism Father    Lung cancer Mother    Liver cancer Mother    Irritable bowel syndrome Sister    Colon cancer Neg Hx    Esophageal cancer Neg Hx    Rectal cancer Neg Hx    Stomach cancer Neg Hx     DRUG ALLERGIES:   Allergies  Allergen Reactions   Mirtazapine Hives and Rash   Clindamycin Hives   Macrobid [Nitrofurantoin]  Hives   Other Hives    Tylox    Sulfonamide Derivatives Hives   Alendronate Sodium Other (See Comments)   Lipitor [Atorvastatin] Other (See Comments)    MUSCLE ACHES   Restasis [Cyclosporine]     Pt stated, "My eyes turned red on the inside and outside of eye; burning sensation"   Sulfa Antibiotics Other (See Comments)   Topamax [Topiramate] Itching    REVIEW OF SYSTEMS:   ROS As per history of present illness. All pertinent systems were reviewed above. Constitutional, HEENT, cardiovascular, respiratory, GI, GU, musculoskeletal, neuro, psychiatric, endocrine, integumentary and hematologic systems were reviewed and are otherwise negative/unremarkable except for positive findings mentioned above in the HPI.   MEDICATIONS AT HOME:   Prior to Admission medications   Medication Sig Start Date End Date Taking? Authorizing Provider  ACCU-CHEK COMPACT PLUS test strip 1 each by Other route as needed.  10/20/12  Yes [provider]  ACCU-CHEK SOFTCLIX LANCETS lancets  05/19/18  Yes [provider]  ALPRAZolam Duanne Moron) 0.5 MG tablet Take 0.5-1 mg by mouth 3 (three) times daily as needed for anxiety. 10/08/12  Yes [provider]  AMBULATORY NON [place] INSULIN PUMP USES AS DIRECTED   Yes [provider]  Blood  Glucose Monitoring Suppl (ACCU-CHEK AVIVA PLUS) w/Device KIT  06/08/20  Yes [provider]  Budeson-Glycopyrrol-Formoterol (BREZTRI AEROSPHERE) 160-9-4.8 MCG/ACT AERO Inhale 2 puffs into the lungs in the morning and at bedtime. 06/07/21  Yes Icard, Octavio Graves, DO  Budeson-Glycopyrrol-Formoterol (BREZTRI AEROSPHERE) 160-9-4.8 MCG/ACT AERO Inhale 2 puffs into the lungs in the morning and at bedtime. 06/07/21  Yes Icard, Bradley L, DO  budesonide (PULMICORT) 0.25 MG/2ML nebulizer solution Take 2 mLs (0.25 mg total) by nebulization in the morning and at bedtime. 06/08/21 09/06/21 Yes Icard, Octavio Graves, DO  clopidogrel (PLAVIX) 75 MG tablet TAKE 1 TABLET EVERY DAY WITH BREAKFAST 06/26/21  Yes Lorretta Harp, MD  DULoxetine (CYMBALTA) 30 MG capsule Take 30 mg by mouth every morning. Takes with 60 mg to total 90 mg 01/22/18  Yes [provider]  estradiol (ESTRACE) 0.5 MG tablet Take 0.5 mg by mouth daily. 04/07/20  Yes [provider]  famotidine (PEPCID) 40 MG tablet TAKE 1 TABLET AT BEDTIME 12/14/20  Yes Irene Shipper, MD  folic acid (FOLVITE) 1 MG tablet Take 1 tablet by mouth daily. 06/23/19  Yes [provider]  Insulin Human (INSULIN PUMP) SOLN Inject into the skin. Novolog----  basal rate--- 12am to 2 am = 1.3units                                          2am  to  6am  = 1.4units                                          6am  to  ?   Yes [provider]  lansoprazole (PREVACID) 30 MG capsule Take 1 capsule (30 mg total) by mouth 2 (two) times daily before a meal. 12/13/20  Yes Irene Shipper, MD  latanoprost (XALATAN) 0.005 % ophthalmic solution Place 1 drop into both eyes at bedtime. 12/26/17  Yes [provider]  levocetirizine (XYZAL) 5 MG tablet Take 5 mg by mouth every morning.  Yes [provider]  levothyroxine (SYNTHROID) 125 MCG tablet Take 125 mcg by mouth daily before breakfast.   Yes [provider]  methotrexate  (RHEUMATREX) 2.5 MG tablet Take 15 mg by mouth once a week. 3 tablets am and at bedtime twice a day once a week,  Saturday's 06/23/19  Yes [provider]  montelukast (SINGULAIR) 10 MG tablet Take 10 mg by mouth at bedtime.   Yes [provider]  NOVOLOG 100 UNIT/ML injection Inject into the skin See admin instructions. Pt has Insulin pump 08/06/18  Yes [provider]  pregabalin (LYRICA) 75 MG capsule Take 75-150 mg by mouth 3 (three) times daily. Takes one cap in am and lunch, two cap at bedtime 07/24/18  Yes [provider]  rosuvastatin (CRESTOR) 20 MG tablet Take 20 mg by mouth at bedtime.   Yes [provider]  sertraline (ZOLOFT) 25 MG tablet Take 25 mg by mouth daily.   Yes [provider]  Vitamin D, Ergocalciferol, (DRISDOL) 50000 UNITS CAPS Take 50,000 Units by mouth every 7 (seven) days. Saturday's   Yes [provider]  albuterol (PROVENTIL) (2.5 MG/3ML) 0.083% nebulizer solution Take 3 mLs (2.5 mg total) by nebulization every 4 (four) hours as needed for wheezing or shortness of breath. 06/07/21   Icard, Octavio Graves, DO  Albuterol Sulfate, sensor, (PROAIR DIGIHALER) 108 (90 Base) MCG/ACT AEPB Inhale into the lungs as needed.    [provider]  busPIRone (BUSPAR) 15 MG tablet Take 15 mg by mouth 2 (two) times daily. Patient not taking: Reported on 07/22/2021 05/25/20   [provider]  predniSONE (DELTASONE) 10 MG tablet Take 4 tabs by mouth once daily x4 days, then 3 tabs x4 days, 2 tabs x4 days, 1 tab x4 days and stop. Patient not taking: Reported on 07/22/2021 06/07/21   June Leap L, DO      VITAL SIGNS:  Blood pressure 104/75, pulse 92, temperature 98.8 F (37.1 C), temperature source Oral, resp. rate 16, height _0  (1.499 m), weight 81.8 kg, SpO2 96 %.  PHYSICAL EXAMINATION:  Physical Exam  GENERAL:  56 y.o.-year-old ill looking Caucasian female patient lying in the bed with no acute  distress.  EYES: Pupils equal, round, reactive to light and accommodation. No scleral icterus. Extraocular muscles intact.  HEENT: Head atraumatic, normocephalic. Oropharynx and nasopharynx clear.  NECK:  Supple, no jugular venous distention. No thyroid enlargement, no tenderness.  LUNGS: Normal breath sounds bilaterally, no wheezing, rales,rhonchi or crepitation. No use of accessory muscles of respiration.  CARDIOVASCULAR: Regular rate and rhythm, S1, S2 normal. No murmurs, rubs, or gallops.  ABDOMEN: Soft, nondistended, suprapubic tenderness and bilateral CVA tenderness more on the right.  No rebound tenderness or guarding or rigidity.. Bowel sounds present. No organomegaly or mass.  EXTREMITIES: No pedal edema, cyanosis, or clubbing.  NEUROLOGIC: Cranial nerves II through XII are intact. Muscle strength 5/5 in all extremities. Sensation intact. Gait not checked.  PSYCHIATRIC: The patient is alert and oriented x 3.  Normal affect and good eye contact. SKIN: No obvious rash, lesion, or ulcer.   LABORATORY PANEL:   CBC Recent Labs  Lab 07/22/21 2006  WBC 18.2*  HGB 11.7*  HCT 35.5*  PLT 225   ------------------------------------------------------------------------------------------------------------------  Chemistries  Recent Labs  Lab 07/22/21 2006  NA 135  K 2.5*  CL 103  CO2 20*  GLUCOSE 208*  BUN 17  CREATININE 1.16*  CALCIUM 9.0  MG 1.7  AST 29  ALT 20  ALKPHOS 69  BILITOT 0.8   ------------------------------------------------------------------------------------------------------------------  Cardiac Enzymes No results for input(s): TROPONINI in the last 168 hours. ------------------------------------------------------------------------------------------------------------------  RADIOLOGY:  DG Chest Port 1 View  Result Date: 07/22/2021 CLINICAL DATA:  Sepsis. EXAM: PORTABLE CHEST 1 VIEW COMPARISON:  Chest radiograph dated 12/22/2020. FINDINGS: No focal  consolidation, pleural effusion, pneumothorax. The cardiac silhouette is within limits. No acute osseous pathology. IMPRESSION: No active disease. Electronically Signed   By: Anner Crete M.D.   On: 07/22/2021 20:44   DG OR UROLOGY CYSTO IMAGE ([facility] ONLY)  Result Date: 07/22/2021 There is no interpretation for this exam.  This order is for images obtained during a surgical procedure.  Please See "Surgeries" Tab for more information regarding the procedure.   CT Renal Stone Study  Result Date: 07/22/2021 CLINICAL DATA:  Flank pain. Kidney stone suspected. Weakness, chills, nausea and shortness of breath. EXAM: CT ABDOMEN AND PELVIS WITHOUT CONTRAST TECHNIQUE: Multidetector CT imaging of the abdomen and pelvis was performed following the standard protocol without IV contrast. RADIATION DOSE REDUCTION: This exam was performed according to the departmental dose-optimization program which includes automated exposure control, adjustment of the mA and/or kV according to patient size and/or use of iterative reconstruction technique. COMPARISON:  01/07/2010 FINDINGS: Lower chest: Mild dependent atelectasis in the right lower lobe. Hepatobiliary: Liver appears normal without contrast. No calcified gallstones. Pancreas: Normal Spleen: Normal Adrenals/Urinary Tract: Adrenal glands are normal. Left kidney contains numerous nonobstructing calculi. The largest in the lower pole measure 5 mm. No hydronephrosis on the left. The right kidney contains a nonobstructing 4 mm stone in the lower pole. There is hydroureteronephrosis on the right with the ureter dilated to the level just proximal to the UVJ. There are 3 stones in the distal ureter. There is a 2 mm stone at the UVJ. There is a 7-8 mm stone just proximal to that. There is a 2 mm stone just proximal to that. No stone in the bladder. Stomach/Bowel: Stomach and small intestine are normal. Normal appendix. Normal colon. Vascular/Lymphatic: Aortic atherosclerosis. No  aneurysm. Aortoiliac stents are present. IVC is normal. No adenopathy. Reproductive: Previous hysterectomy.  No pelvic mass. Other: No free fluid or air. Small umbilical hernia containing only fat. Musculoskeletal: Normal IMPRESSION: Hydroureteronephrosis on the right. Three stones in the distal ureter. 2 mm stone at the right UVJ. 7-8 mm stone just proximal to that. 2 mm stone just proximal to the left. Nonobstructing 4 mm stone in the lower pole the right kidney. Multiple nonobstructing stones in the left kidney, the largest in the lower pole measuring 5 mm. Aortic atherosclerosis.  Bilateral aortoiliac stents. Electronically Signed   By: Nelson Chimes M.D.   On: 07/22/2021 21:50      IMPRESSION AND PLAN:  Principal Problem:   Obstructive uropathy  1.  Obstructive uropathy secondary to ureteral stones.  She has subsequent UTI with subsequent sepsis manifested by leukocytosis, tachypnea and tachycardia. - The patient was admitted to a medical telemetry bed. - She will be kept NPO. - Will be hydrated with IV normal saline with added potassium chloride. - Pain management will be provided. - The patient will be placed on IV Rocephin and will follow urine and blood cultures. - Urology consult was obtained by Dr. Diamantina Providence was consulted - The patient will undergo cystoureteroscopy and ureteral stent placement tonight.  2.  Hypokalemia. - Potassium will be replaced and magnesium level will be checked.  3.  Acute kidney injury. - She will  be hydrated with IV normal saline and will follow her BMP.  4.  Type 2 diabetes mellitus. - The patient will be placed on supplement coverage with NovoLog.  5.  Post ablative hypothyroidism. - We will continue Synthroid.  6.  Rheumatoid arthritis. - We will continue methotrexate.  7.  GERD. - We will continue PPI therapy.  8.  COPD. - We will continue her inhalers.  DVT prophylaxis: Lovenox. Advanced Care Planning:  Code Status: full code.. Family  Communication:  The plan of care was discussed in details with the patient (and family). I answered all questions. The patient agreed to proceed with the above mentioned plan. Further management will depend upon hospital course. Disposition Plan: Back to previous home environment Consults called: Neurology. All the records are reviewed and case discussed with ED provider.  Status is: Inpatient  At the time of the admission, it appears that the appropriate admission status for this patient is inpatient.  This is judged to be reasonable and necessary in order to provide the required intensity of service to ensure the patient's safety given the presenting symptoms, physical exam findings and initial radiographic and laboratory data in the context of comorbid conditions.  The patient requires inpatient status due to high intensity of service, high risk of further deterioration and high frequency of surveillance required.  I certify that at the time of admission, it is my clinical judgment that the patient will require inpatient hospital care extending more than 2 midnights.                            Dispo: The patient is from: Home              Anticipated d/c is to: Home              Patient currently is not medically stable to d/c.              Difficult to place patient: No    Christel Mormon M.D on 07/23/2021 at 12:24 AM  [facility]   From 7 PM-7 AM, contact night-coverage [email or web address]  CC: Primary care physician; Prince Solian, MD

## 2021-07-23 NOTE — Assessment & Plan Note (Addendum)
-   From obstructive uropathy.  Resolved.

## 2021-07-23 NOTE — TOC Initial Note (Signed)
Transition of Care [facility]) - Initial/Assessment Note    Patient Details  Name: Ashley [name] MRN: [id number] Date of Birth: 1966-01-21  Transition of Care [facility]) CM/SW Contact:    Magnus Ivan, LCSW Phone Number: 07/23/2021, 12:21 PM  Clinical Narrative:                CSW spoke with patient to complete high risk assessment.  Patient lives alone and drives herself to appointments. PCP is Dr. Dagmar Hait. [facility]acy [facility] in [place]. Patient has a CPAP at home. Had [facility] in the past. Patient denies TOC needs at this time and agreed to reach out if needs arise.    Expected Discharge Plan: Home/Self Care Barriers to Discharge: Continued Medical Work up   Patient Goals and CMS Choice Patient states their goals for this hospitalization and ongoing recovery are:: TBD CMS Medicare.gov Compare Post Acute Care list provided to:: Patient Choice offered to / list presented to : Patient  Expected Discharge Plan and Services Expected Discharge Plan: Home/Self Care       Living arrangements for the past 2 months: Single Family Home                                      Prior Living Arrangements/Services Living arrangements for the past 2 months: Single Family Home Lives with:: Self Patient language and need for interpreter reviewed:: Yes Do you feel safe going back to the place where you live?: Yes      Need for Family Participation in Patient Care: Yes (Comment) Care giver support system in place?: Yes (comment) Current home services: DME Criminal Activity/Legal Involvement Pertinent to Current Situation/Hospitalization: No - Comment as needed  Activities of Daily Living Home Assistive Devices/Equipment: CBG Meter, Insulin Pump ADL Screening (condition at time of admission) Patient's cognitive ability adequate to safely complete daily activities?: Yes Is the patient deaf or have difficulty hearing?: No Does the patient have difficulty seeing, even  when wearing glasses/contacts?: No Does the patient have difficulty concentrating, remembering, or making decisions?: No Patient able to express need for assistance with ADLs?: Yes Does the patient have difficulty dressing or bathing?: No Independently performs ADLs?: Yes (appropriate for developmental age) Does the patient have difficulty walking or climbing stairs?: No Weakness of Legs: None Weakness of Arms/Hands: None  Permission Sought/Granted Permission sought to share information with : Facility Art therapist granted to share information with : Yes, Verbal Permission Granted     Permission granted to share info w AGENCY: as needed        Emotional Assessment       Orientation: : Oriented to Self, Oriented to Place, Oriented to  Time, Oriented to Situation Alcohol / Substance Use: Not Applicable Psych Involvement: No (comment)  Admission diagnosis:  Ureteral calculi [N20.1] Obstructive uropathy [N13.9] Urinary tract infection with hematuria, site unspecified [N39.0, R31.9] Sepsis, due to unspecified organism, unspecified whether acute organ dysfunction present [facility]) [A41.9] Patient Active Problem List   Diagnosis Date Noted   Sepsis secondary to UTI ([place]) 07/23/2021   Acute pyelonephritis 07/23/2021   Hydronephrosis, right 07/23/2021   Right ureteral stone 07/23/2021   Gram-negative bacteremia 07/23/2021   Leukocytosis 07/23/2021   AKI (acute kidney injury) ([place]) 07/23/2021   Hypokalemia 07/23/2021   Uncontrolled type 2 diabetes mellitus with hyperglycemia, with long-term current use of insulin ([place]) 07/23/2021   COPD (chronic  obstructive pulmonary disease) ([place]) 07/23/2021   Obstructive uropathy 07/22/2021   Endotracheal tube present    DKA (diabetic ketoacidosis) ([place]) 06/21/2020   Rheumatoid arthritis ([place]) 06/21/2020   Pneumonia due to COVID-19 virus 06/21/2020   Tobacco abuse 02/24/2020   Claudication in peripheral vascular disease ([place])  01/01/2019   Essential hypertension 12/25/2018   Hyperlipidemia 12/25/2018   Peripheral arterial disease ([place]) 12/25/2018   Hypothyroidism 12/09/2018   Hypertensive disorder 12/09/2018   OSA on CPAP 10/08/2018   Loud snoring 05/19/2018   OSA and COPD overlap syndrome ([place]) 04/02/2018   Excessive daytime sleepiness 04/02/2018   Delayed sleep phase syndrome 04/02/2018   Moderate persistent asthma 04/02/2018   Dyspnea and respiratory abnormality 04/26/2015   Chest tightness 04/26/2015   Hoarseness of voice 04/26/2015   Nausea with vomiting 10/22/2012   Diarrhea 10/22/2012   Gastroparesis 03/22/2009   DYSPHAGIA 03/22/2009   DIAB W/O MENTION COMP TYPE I [JUV TYPE] UNCNTRL 02/28/2009   GERD 02/28/2009   DYSPHAGIA UNSPECIFIED 02/28/2009   ABDOMINAL PAIN-EPIGASTRIC 02/28/2009   PCP:  Prince Solian, MD Pharmacy:   [id number] [place] [place] [address] [place] [address] Phone: [phone] Fax: [phone]  [facility] [place], [address] [place] [address] Phone: [phone] Fax: [phone]     Social Determinants of Health (SDOH) Interventions    Readmission Risk Interventions Readmission Risk Prevention Plan 07/23/2021  Transportation Screening Complete  PCP or Specialist Appt within 3-5 Days Complete  HRI or Home Care Consult Complete  Social Work Consult for Magazine Planning/Counseling Complete  Palliative Care Screening Not Applicable  Medication Review Press photographer) Complete  Some recent data might be hidden

## 2021-07-23 NOTE — Assessment & Plan Note (Addendum)
Obstructive uropathy causing right hydroureteronephrosis from right ureterolithiasis E. coli bacteremia -Status post cystoscopy and right ureteral stent placement by urology -Foley catheter removed on 07/24/2021.  Outpatient follow-up with urology. -Currently on Rocephin.  E. coli sensitivities available.  Discharge patient home today on Keflex for another 7 days.

## 2021-07-23 NOTE — Assessment & Plan Note (Addendum)
-   Patient uses insulin pump at home.  Carb modified diet.  Resume insulin pump at discharge. -Recent A1c 8.2 a month ago

## 2021-07-24 ENCOUNTER — Encounter: Payer: Self-pay | Admitting: Urology

## 2021-07-24 DIAGNOSIS — N179 Acute kidney failure, unspecified: Secondary | ICD-10-CM

## 2021-07-24 DIAGNOSIS — R7881 Bacteremia: Secondary | ICD-10-CM

## 2021-07-24 DIAGNOSIS — B9620 Unspecified Escherichia coli [E. coli] as the cause of diseases classified elsewhere: Secondary | ICD-10-CM

## 2021-07-24 DIAGNOSIS — N10 Acute tubulo-interstitial nephritis: Secondary | ICD-10-CM

## 2021-07-24 DIAGNOSIS — N1330 Unspecified hydronephrosis: Secondary | ICD-10-CM

## 2021-07-24 DIAGNOSIS — D72829 Elevated white blood cell count, unspecified: Secondary | ICD-10-CM

## 2021-07-24 LAB — CBC WITH DIFFERENTIAL/PLATELET
Abs Immature Granulocytes: 0.08 10*3/uL — ABNORMAL HIGH (ref 0.00–0.07)
Basophils Absolute: 0 10*3/uL (ref 0.0–0.1)
Basophils Relative: 0 %
Eosinophils Absolute: 0 10*3/uL (ref 0.0–0.5)
Eosinophils Relative: 0 %
HCT: 30 % — ABNORMAL LOW (ref 36.0–46.0)
Hemoglobin: 9.4 g/dL — ABNORMAL LOW (ref 12.0–15.0)
Immature Granulocytes: 1 %
Lymphocytes Relative: 9 %
Lymphs Abs: 1.3 10*3/uL (ref 0.7–4.0)
MCH: 28.5 pg (ref 26.0–34.0)
MCHC: 31.3 g/dL (ref 30.0–36.0)
MCV: 90.9 fL (ref 80.0–100.0)
Monocytes Absolute: 0.8 10*3/uL (ref 0.1–1.0)
Monocytes Relative: 5 %
Neutro Abs: 12.4 10*3/uL — ABNORMAL HIGH (ref 1.7–7.7)
Neutrophils Relative %: 85 %
Platelets: 227 10*3/uL (ref 150–400)
RBC: 3.3 MIL/uL — ABNORMAL LOW (ref 3.87–5.11)
RDW: 14.4 % (ref 11.5–15.5)
WBC: 14.6 10*3/uL — ABNORMAL HIGH (ref 4.0–10.5)
nRBC: 0 % (ref 0.0–0.2)

## 2021-07-24 LAB — BASIC METABOLIC PANEL
Anion gap: 9 (ref 5–15)
BUN: 20 mg/dL (ref 6–20)
CO2: 20 mmol/L — ABNORMAL LOW (ref 22–32)
Calcium: 8.3 mg/dL — ABNORMAL LOW (ref 8.9–10.3)
Chloride: 109 mmol/L (ref 98–111)
Creatinine, Ser: 0.86 mg/dL (ref 0.44–1.00)
GFR, Estimated: >60 mL/min (ref >60)
Glucose, Bld: 246 mg/dL — ABNORMAL HIGH (ref 70–99)
Potassium: 3.6 mmol/L (ref 3.5–5.1)
Sodium: 138 mmol/L (ref 135–145)

## 2021-07-24 LAB — MAGNESIUM: Magnesium: 1.9 mg/dL (ref 1.7–2.4)

## 2021-07-24 LAB — POTASSIUM: Potassium: 3.7 mmol/L (ref 3.5–5.1)

## 2021-07-24 LAB — GLUCOSE, CAPILLARY
Glucose-Capillary: 238 mg/dL — ABNORMAL HIGH (ref 70–99)
Glucose-Capillary: 270 mg/dL — ABNORMAL HIGH (ref 70–99)
Glucose-Capillary: 285 mg/dL — ABNORMAL HIGH (ref 70–99)
Glucose-Capillary: 248 mg/dL — ABNORMAL HIGH (ref 70–99)
Glucose-Capillary: 244 mg/dL — ABNORMAL HIGH (ref 70–99)

## 2021-07-24 MED ORDER — INSULIN ASPART 100 UNIT/ML IJ SOLN
0.0000 [IU] | Freq: Three times a day (TID) | INTRAMUSCULAR | Status: DC
  Administered 2021-07-24: 16:00:00 5 [IU] via SUBCUTANEOUS
  Administered 2021-07-24: 8 [IU] via SUBCUTANEOUS
  Administered 2021-07-25: 10:00:00 5 [IU] via SUBCUTANEOUS
  Filled 2021-07-24 (×3): qty 1

## 2021-07-24 MED ORDER — INSULIN DETEMIR 100 UNIT/ML ~~LOC~~ SOLN
18.0000 [IU] | Freq: Two times a day (BID) | SUBCUTANEOUS | Status: DC
  Administered 2021-07-24 – 2021-07-25 (×2): 18 [IU] via SUBCUTANEOUS
  Filled 2021-07-24 (×3): qty 0.18

## 2021-07-24 MED ORDER — INSULIN ASPART 100 UNIT/ML IJ SOLN: 0.0000 [IU] | Freq: Three times a day (TID) | INTRAMUSCULAR | Status: DC

## 2021-07-24 MED ORDER — ACETAMINOPHEN 650 MG RE SUPP: 650.0000 mg | Freq: Four times a day (QID) | RECTAL | Status: DC | PRN

## 2021-07-24 MED ORDER — ACETAMINOPHEN 500 MG PO TABS
1000.0000 mg | ORAL_TABLET | Freq: Four times a day (QID) | ORAL | Status: DC | PRN
  Administered 2021-07-24 (×2): 1000 mg via ORAL
  Filled 2021-07-24 (×2): qty 2

## 2021-07-24 MED ORDER — INSULIN ASPART 100 UNIT/ML IJ SOLN
4.0000 [IU] | Freq: Three times a day (TID) | INTRAMUSCULAR | Status: DC
  Administered 2021-07-24 – 2021-07-25 (×2): 4 [IU] via SUBCUTANEOUS
  Filled 2021-07-24 (×2): qty 1

## 2021-07-24 NOTE — Progress Notes (Addendum)
Inpatient Diabetes Program Recommendations  AACE/ADA: New Consensus Statement on Inpatient Glycemic Control (2015)  Target Ranges:  Prepandial:   less than 140 mg/dL      Peak postprandial:   less than 180 mg/dL (1-2 hours)      Critically ill patients:  140 - 180 mg/dL    Latest Reference Range & Units 07/23/21 00:48 07/23/21 02:14 07/23/21 04:17 07/23/21 06:43 07/23/21 08:52 07/23/21 12:36 07/23/21 17:48 07/23/21 20:39  Glucose-Capillary 70 - 99 mg/dL 302 (H)  28m Decadron '@0015'  313 (H) 431 (H) 349 (H) 286 (H)  11 units Novolog  293 (H)     16 units Levemir '@1457'  388 (H)  20 units Novolog  292 (H)  3 units Novolog  16 units Levemir '@2246'     Latest Reference Range & Units 07/24/21 08:03 07/24/21 12:16  Glucose-Capillary 70 - 99 mg/dL 238 (H)  7 units Novolog  16 units Levemir '@0918'  285 (H)     Admit with: Right-sided hydroureteronephrosis with ureterolithiasis and UTI  History: Type 1 Diabetes  Home DM Meds: Medtronic Insulin Pump  Current Orders: Levemir 16 units BID      Novolog Resistant Correction Scale/ SSI (0-20 units) TID AC + HS     MD- Please consider:  1. Increase Levemir to 18 units BID  2. Reduce Novolog SSI to Moderate scale (0-15 units) TID AC + HS  3. Start Novolog Meal Coverage: Novolog 4 units TID with meals   Met w/ pt at bedside this AM.  Pt states she can't remember her Correction Factor or Carb coverage factor but thinks the Correction Factor is close to 1:40 and Carb Factor is 1:17.  Pump was sent home with pt's son.    PCP: Dr. ADagmar Haitwith [place] Pt reported basal insulin pump settings: 12 am-2 am: 1.3 units/hr 2 am- 6 am: 1.4 units/hr 6 am- 3 pm: 1.8 units/hr 3 pm- 12 am: 1.5 units/hr  TOTAL basal: 37.90 units/24 hours    --Will follow patient during hospitalization--  JWyn QuakerRN, MSN, CDE Diabetes Coordinator Inpatient Glycemic Control Team Team Pager: [phone](8a-5p)

## 2021-07-24 NOTE — Evaluation (Signed)
Physical Therapy Evaluation Patient Details Name: Ashley [name] MRN: [id number] DOB: Apr 17, 1966 Today's Date: 07/24/2021  History of Present Illness  56 year old female with h/o COPD, GERD, gastroparesis, anxiety, depression, hypertension, dyslipidemia and postablative hypothyroidism presented with weakness and shortness of breath.  On presentation, potassium was 2.5, creatinine of 1.16, WBCs of 18.2.  COVID-19 and influenza testing were negative.  UA was positive for UTI.  Chest x-ray was negative for acute cardiopulmonary disease.  CT renal stone study revealed right hydroureteronephrosis with ureterolithiasis.  She was started on IV fluids and antibiotics.  Urology was consulted.  She underwent cystoscopy and right ureteral stent placement by urology.   Clinical Impression  Patient received standing in room. She is eager to walk. Patient is independent in room. Ambulated 500 feet without AD and supervision only. She does not require continued PT at this time and is safe to ambulate with nursing staff or mobility specialist during stay.           Recommendations for follow up therapy are one component of a multi-disciplinary discharge planning process, led by the attending physician.  Recommendations may be updated based on patient status, additional functional criteria and insurance authorization.  Follow Up Recommendations No PT follow up    Assistance Recommended at Discharge None  Patient can return home with the following       Equipment Recommendations None recommended by PT  Recommendations for Other Services       Functional Status Assessment Patient has not had a recent decline in their functional status     Precautions / Restrictions Precautions Precautions: None Precaution Comments: low fall Restrictions Weight Bearing Restrictions: No      Mobility  Bed Mobility               General bed mobility comments: Patient received standing in room at sink     Transfers                   General transfer comment: patient received in standing    Ambulation/Gait Ambulation/Gait assistance: Min guard, Supervision Gait Distance (Feet): 500 Feet Assistive device: None Gait Pattern/deviations: WFL(Within Functional Limits) Gait velocity: WNL     General Gait Details: patient safe with supervision during ambulation. No AD. Assist to push IV pole.  Stairs            Wheelchair Mobility    Modified Rankin (Stroke Patients Only)       Balance Overall balance assessment: Independent                                           Pertinent Vitals/Pain Pain Assessment Pain Assessment: No/denies pain    Home Living Family/patient expects to be discharged to:: Private residence Living Arrangements: Alone Available Help at Discharge: Family;Available PRN/intermittently Type of Home: Apartment Home Access: Stairs to enter         Home Equipment: None      Prior Function Prior Level of Function : Independent/Modified Independent             Mobility Comments: independent with ambulation ADLs Comments: independent     Hand Dominance        Extremity/Trunk Assessment   Upper Extremity Assessment Upper Extremity Assessment: Overall WFL for tasks assessed    Lower Extremity Assessment Lower Extremity Assessment: Overall WFL for tasks assessed    Cervical /  Trunk Assessment Cervical / Trunk Assessment: Normal  Communication   Communication: No difficulties  Cognition Arousal/Alertness: Awake/alert Behavior During Therapy: WFL for tasks assessed/performed Overall Cognitive Status: Within Functional Limits for tasks assessed                                          General Comments      Exercises     Assessment/Plan    PT Assessment Patient does not need any further PT services  PT Problem List         PT Treatment Interventions      PT Goals (Current  goals can be found in the Care Plan section)  Acute Rehab PT Goals Patient Stated Goal: to go home PT Goal Formulation: With patient Time For Goal Achievement: 07/24/21 Potential to Achieve Goals: Good    Frequency       Co-evaluation               AM-PAC PT "6 Clicks" Mobility  Outcome Measure Help needed turning from your back to your side while in a flat bed without using bedrails?: None Help needed moving from lying on your back to sitting on the side of a flat bed without using bedrails?: None Help needed moving to and from a bed to a chair (including a wheelchair)?: None Help needed standing up from a chair using your arms (e.g., wheelchair or bedside chair)?: None Help needed to walk in hospital room?: None Help needed climbing 3-5 steps with a railing? : None 6 Click Score: 24    End of Session   Activity Tolerance: Patient tolerated treatment well Patient left: in chair;with call bell/phone within reach Nurse Communication: Mobility status      Time: 1131-1149 PT Time Calculation (min) (ACUTE ONLY): 18 min   Charges:   PT Evaluation $PT Eval Low Complexity: 1 Low PT Treatments $Gait Training: 8-22 mins        [employer], PT, [employer] 07/24/21,12:27 PM

## 2021-07-24 NOTE — Anesthesia Postprocedure Evaluation (Signed)
Anesthesia Post Note  Patient: Ashley [name]  Procedure(s) Performed: CYSTOSCOPY WITH STENT PLACEMENT (Right)  Patient location during evaluation: PACU Anesthesia Type: General Level of consciousness: awake and alert Pain management: pain level controlled Vital Signs Assessment: post-procedure vital signs reviewed and stable Respiratory status: spontaneous breathing, nonlabored ventilation, respiratory function stable and patient connected to nasal cannula oxygen Cardiovascular status: blood pressure returned to baseline and stable Postop Assessment: no apparent nausea or vomiting Anesthetic complications: no   No notable events documented.   Last Vitals:  Vitals:   07/23/21 2026 07/24/21 0516  BP: 125/69 115/63  Pulse: 98 72  Resp: 18 18  Temp: 36.5 C 36.4 C  SpO2: 99% 98%    Last Pain:  Vitals:   07/23/21 2211  TempSrc:   PainSc: 4                  Martha Clan

## 2021-07-24 NOTE — Progress Notes (Signed)
Progress Note   Patient: Ashley [name] OXB:[id number] DOB: 1965/07/07 DOA: 07/22/2021     2 DOS: the patient was seen and examined on 07/24/2021   Brief hospital course: 56 year old female with h/o COPD, GERD, gastroparesis, anxiety, depression, hypertension, dyslipidemia and postablative hypothyroidism presented with weakness and shortness of breath.  On presentation, potassium was 2.5, creatinine of 1.16, WBCs of 18.2.  COVID-19 and influenza testing were negative.  UA was positive for UTI.  Chest x-ray was negative for acute cardiopulmonary disease.  CT renal stone study revealed right hydroureteronephrosis with ureterolithiasis.  She was started on IV fluids and antibiotics.  Urology was consulted.  She underwent cystoscopy and right ureteral stent placement by urology.  Assessment and Plan: * Acute pyelonephritis- (present on admission) Obstructive uropathy causing right hydroureteronephrosis from right ureterolithiasis E. coli bacteremia -Status post cystoscopy and right ureteral stent placement by urology -Urology has cleared for the Foley catheter to be removed today. outpatient follow-up with urology. -Continue Rocephin.  Follow sensitivities for E. coli in the blood and urine.  Sepsis secondary to UTI [facility])- (present on admission) Severe sepsis - Presented with leukocytosis, tachypnea, tachycardia, lactic acidosis, acute kidney injury due to acute pyelonephritis. -Antibiotic plan as below. -Blood and urine cultures growing E. coli.  Follow sensitivities.  Leukocytosis- (present on admission) - Possibly from above.  Monitor.  AKI (acute kidney injury) ([place])- (present on admission) - From obstructive uropathy.  Resolved.  Uncontrolled type 2 diabetes mellitus with hyperglycemia, with long-term current use of insulin ([place]) - Patient uses insulin pump at home.  Insulin pump to remain on hold for now. -CBGs with SSI.  Carb modified diet.  Diabetes coordinator  following. -Continue long-acting insulin twice a day -Recent A1c 8.2 a month ago  Hypokalemia-resolved as of 07/24/2021, (present on admission) - Repeat a.m. labs  COPD (chronic obstructive pulmonary disease) ([place])- (present on admission) - Currently stable.  Continue current inhaled regimen.  Rheumatoid arthritis ([place])- (present on admission) - Resume methotrexate on discharge  Hypothyroidism- (present on admission) - Continue levothyroxine  Hyperlipidemia- (present on admission) - Continue statin  GERD- (present on admission) - Continue PPI        Subjective:  Patient seen and examined at bedside.  Feels better but feels weak and is requesting for PT eval.  Hoping to get the urinary catheter out today.  Flank pain improving.  Denies overnight fever, vomiting or chest pain.  Physical Exam: Vitals:   07/23/21 2026 07/24/21 0124 07/24/21 0516 07/24/21 0804  BP: 125/69  115/63 (!) 145/64  Pulse: 98  72 86  Resp: 18  18 16   Temp: 97.7 F (36.5 C)  97.6 F (36.4 C) 98.8 F (37.1 C)  TempSrc:      SpO2: 99%  98% 99%  Weight:  80.9 kg    Height:       General: No acute distress, currently on room air ENT/neck: No elevated JVD.  No obvious masses  respiratory: Bilateral decreased breath sounds at bases with some scattered crackles CVS: S1-S2 heard, rate controlled Abdominal: Soft, obese, nontender, nondistended, no organomegaly, bowel sounds heard Extremities: No cyanosis, clubbing, edema CNS: Alert, awake and oriented.  No focal neurologic deficit.  Moving extremities. Lymph: No cervical lymphadenopathy Skin: No rashes, lesions, ulcers Psych: Normal mood, affect and judgment Genitourinary: Foley catheter present.  Data Reviewed: I have reviewed patient's investigations including blood work and imaging since admission.  CBC is reviewed.  Today's sodium is 138, potassium of 3.7, creatinine of 0.86,  WBCs of 14.6, hemoglobin of 9.4.  Blood and urine cultures are  growing E. coli, sensitivities pending   Family Communication: None at bedside  Disposition: Status is: Inpatient Remains inpatient appropriate because: Of need for IV antibiotics.  Await sensitivities     Planned Discharge Destination: Home     Time spent: 50 minutes  Author: Aline August, MD 07/24/2021 10:01 AM  For on call review [email or web address].

## 2021-07-25 ENCOUNTER — Other Ambulatory Visit: Payer: Self-pay | Admitting: Urology

## 2021-07-25 DIAGNOSIS — J449 Chronic obstructive pulmonary disease, unspecified: Secondary | ICD-10-CM

## 2021-07-25 DIAGNOSIS — R7881 Bacteremia: Secondary | ICD-10-CM

## 2021-07-25 DIAGNOSIS — N201 Calculus of ureter: Secondary | ICD-10-CM

## 2021-07-25 LAB — CULTURE, BLOOD (ROUTINE X 2): Special Requests: ADEQUATE

## 2021-07-25 LAB — CBC WITH DIFFERENTIAL/PLATELET
Abs Immature Granulocytes: 0.04 10*3/uL (ref 0.00–0.07)
Basophils Absolute: 0 10*3/uL (ref 0.0–0.1)
Basophils Relative: 0 %
Eosinophils Absolute: 0 10*3/uL (ref 0.0–0.5)
Eosinophils Relative: 0 %
HCT: 29 % — ABNORMAL LOW (ref 36.0–46.0)
Hemoglobin: 9.2 g/dL — ABNORMAL LOW (ref 12.0–15.0)
Immature Granulocytes: 0 %
Lymphocytes Relative: 25 %
Lymphs Abs: 2.2 10*3/uL (ref 0.7–4.0)
MCH: 28.2 pg (ref 26.0–34.0)
MCHC: 31.7 g/dL (ref 30.0–36.0)
MCV: 89 fL (ref 80.0–100.0)
Monocytes Absolute: 0.8 10*3/uL (ref 0.1–1.0)
Monocytes Relative: 9 %
Neutro Abs: 5.9 10*3/uL (ref 1.7–7.7)
Neutrophils Relative %: 66 %
Platelets: 278 10*3/uL (ref 150–400)
RBC: 3.26 MIL/uL — ABNORMAL LOW (ref 3.87–5.11)
RDW: 14.6 % (ref 11.5–15.5)
WBC: 9 10*3/uL (ref 4.0–10.5)
nRBC: 0 % (ref 0.0–0.2)

## 2021-07-25 LAB — GLUCOSE, CAPILLARY: Glucose-Capillary: 243 mg/dL — ABNORMAL HIGH (ref 70–99)

## 2021-07-25 LAB — HIV ANTIBODY (ROUTINE TESTING W REFLEX): HIV Screen 4th Generation wRfx: NONREACTIVE

## 2021-07-25 LAB — URINE CULTURE: Culture: 10000 — AB

## 2021-07-25 LAB — BASIC METABOLIC PANEL
Anion gap: 5 (ref 5–15)
BUN: 16 mg/dL (ref 6–20)
CO2: 24 mmol/L (ref 22–32)
Calcium: 8.3 mg/dL — ABNORMAL LOW (ref 8.9–10.3)
Chloride: 111 mmol/L (ref 98–111)
Creatinine, Ser: 1.07 mg/dL — ABNORMAL HIGH (ref 0.44–1.00)
GFR, Estimated: >60 mL/min (ref >60)
Glucose, Bld: 303 mg/dL — ABNORMAL HIGH (ref 70–99)
Potassium: 3.2 mmol/L — ABNORMAL LOW (ref 3.5–5.1)
Sodium: 140 mmol/L (ref 135–145)

## 2021-07-25 LAB — MAGNESIUM: Magnesium: 1.9 mg/dL (ref 1.7–2.4)

## 2021-07-25 MED ORDER — CEPHALEXIN 500 MG PO CAPS: 500.0000 mg | ORAL_CAPSULE | Freq: Four times a day (QID) | ORAL | 0 refills | Status: AC

## 2021-07-25 MED ORDER — BUDESONIDE 0.25 MG/2ML IN SUSP: 0.2500 mg | Freq: Two times a day (BID) | RESPIRATORY_TRACT | Status: DC | PRN

## 2021-07-25 MED ORDER — ONDANSETRON HCL 4 MG PO TABS: 4.0000 mg | ORAL_TABLET | Freq: Four times a day (QID) | ORAL | 0 refills | Status: DC | PRN

## 2021-07-25 MED ORDER — METHOTREXATE 2.5 MG PO TABS: 15.0000 mg | ORAL_TABLET | ORAL | Status: AC

## 2021-07-25 MED ORDER — POTASSIUM CHLORIDE CRYS ER 20 MEQ PO TBCR
40.0000 meq | EXTENDED_RELEASE_TABLET | ORAL | Status: DC
  Administered 2021-07-25: 09:00:00 40 meq via ORAL
  Filled 2021-07-25: qty 2

## 2021-07-25 NOTE — Discharge Summary (Signed)
Physician Discharge Summary   Patient: Ashley [name] MRN: [id number] DOB: 1965/07/21  Admit date:     07/22/2021  Discharge date: 07/25/21  Discharge Physician: Aline August   PCP: Prince Solian, MD   Recommendations at discharge:   Follow-up with PCP within a week with repeat CBC/BMP Outpatient follow-up with urology Follow-up in the ED if symptoms worsen or new appear  Hospital Course: 56 year old female with h/o COPD, GERD, gastroparesis, anxiety, depression, hypertension, dyslipidemia and postablative hypothyroidism presented with weakness and shortness of breath.  On presentation, potassium was 2.5, creatinine of 1.16, WBCs of 18.2.  COVID-19 and influenza testing were negative.  UA was positive for UTI.  Chest x-ray was negative for acute cardiopulmonary disease.  CT renal stone study revealed right hydroureteronephrosis with ureterolithiasis.  She was started on IV fluids and antibiotics.  Urology was consulted.  She underwent cystoscopy and right ureteral stent placement by urology.  During the hospitalization, blood cultures grew E. coli.  She is tolerating diet and currently hemodynamically stable.  Foley catheter has been removed.  She will be discharged home today on oral Keflex with outpatient follow-up with PCP and urology.  Assessment and Plan: * Acute pyelonephritis- (present on admission) Obstructive uropathy causing right hydroureteronephrosis from right ureterolithiasis E. coli bacteremia -Status post cystoscopy and right ureteral stent placement by urology -Foley catheter removed on 07/24/2021.  Outpatient follow-up with urology. -Currently on Rocephin.  E. coli sensitivities available.  Discharge patient home today on Keflex for another 7 days.  Sepsis secondary to UTI [facility])- (present on admission) Severe sepsis - Presented with leukocytosis, tachypnea, tachycardia, lactic acidosis, acute kidney injury due to acute pyelonephritis. -Antibiotic plan as  below. -Blood and urine cultures growing E. coli.   -Sepsis has resolved.  Leukocytosis- (present on admission) - Possibly from above.  Resolved.  AKI (acute kidney injury) ([place])- (present on admission) - From obstructive uropathy.  Resolved.  Uncontrolled type 2 diabetes mellitus with hyperglycemia, with long-term current use of insulin ([place]) - Patient uses insulin pump at home.  Carb modified diet.  Resume insulin pump at discharge. -Recent A1c 8.2 a month ago  Hypokalemia-resolved as of 07/24/2021, (present on admission) - Repeat a.m. labs  COPD (chronic obstructive pulmonary disease) ([place])- (present on admission) - Currently stable.  Continue home regimen.  Rheumatoid arthritis ([place])- (present on admission) - Resume methotrexate a week after discharge  Hypothyroidism- (present on admission) - Continue levothyroxine  Hyperlipidemia- (present on admission) - Continue statin  GERD- (present on admission) - Continue PPI          Consultants: Urology Procedures performed: Cystoscopy and stent placement Disposition: Home Diet recommendation:  Discharge Diet Orders (From admission, onward)     Start     Ordered   07/25/21 0000  Diet - low sodium heart healthy        07/25/21 0922   07/25/21 0000  Diet Carb Modified        07/25/21 0922           Carb modified diet  DISCHARGE MEDICATION: Allergies as of 07/25/2021       Reactions   Mirtazapine Hives, Rash   Clindamycin Hives   Macrobid [nitrofurantoin] Hives   Other Hives   Tylox    Sulfonamide Derivatives Hives   Alendronate Sodium Other (See Comments)   Lipitor [atorvastatin] Other (See Comments)   MUSCLE ACHES   Restasis [cyclosporine]    Pt stated, "My eyes turned red on the inside and outside of eye;  burning sensation"   Sulfa Antibiotics Other (See Comments)   Topamax [topiramate] Itching        Medication List     STOP taking these medications    busPIRone 15 MG tablet Commonly  known as: BUSPAR   predniSONE 10 MG tablet Commonly known as: DELTASONE       TAKE these medications    Accu-Chek Aviva Plus w/Device Kit   Accu-Chek Compact Plus test strip Generic drug: glucose blood 1 each by Other route as needed.   Accu-Chek Softclix Lancets lancets   ALPRAZolam 0.5 MG tablet Commonly known as: XANAX Take 0.5-1 mg by mouth 3 (three) times daily as needed for anxiety.   AMBULATORY NON FORMULARY MEDICATION NOVOLOG INSULIN PUMP USES AS DIRECTED   Breztri Aerosphere 160-9-4.8 MCG/ACT Aero Generic drug: Budeson-Glycopyrrol-Formoterol Inhale 2 puffs into the lungs in the morning and at bedtime. What changed: Another medication with the same name was removed. Continue taking this medication, and follow the directions you see here.   budesonide 0.25 MG/2ML nebulizer solution Commonly known as: Pulmicort Take 2 mLs (0.25 mg total) by nebulization 2 (two) times daily as needed (wheezing/dyspnea). What changed:  when to take this reasons to take this   cephALEXin 500 MG capsule Commonly known as: KEFLEX Take 1 capsule (500 mg total) by mouth 4 (four) times daily for 7 days.   clopidogrel 75 MG tablet Commonly known as: PLAVIX TAKE 1 TABLET EVERY DAY WITH BREAKFAST   DULoxetine 30 MG capsule Commonly known as: CYMBALTA Take 30 mg by mouth every morning.   estradiol 0.5 MG tablet Commonly known as: ESTRACE Take 0.5 mg by mouth daily.   famotidine 40 MG tablet Commonly known as: PEPCID TAKE 1 TABLET AT BEDTIME   folic acid 1 MG tablet Commonly known as: FOLVITE Take 1 tablet by mouth daily.   insulin pump Soln Inject into the skin. Novolog----  basal rate--- 12am to 2 am = 1.3units                                          2am  to  6am  = 1.4units                                          6am  to  ?   lansoprazole 30 MG capsule Commonly known as: PREVACID Take 1 capsule (30 mg total) by mouth 2 (two) times daily before a meal.   latanoprost  0.005 % ophthalmic solution Commonly known as: XALATAN Place 1 drop into both eyes at bedtime.   levocetirizine 5 MG tablet Commonly known as: XYZAL Take 5 mg by mouth every morning.   levothyroxine 125 MCG tablet Commonly known as: SYNTHROID Take 125 mcg by mouth daily before breakfast.   methotrexate 2.5 MG tablet Commonly known as: RHEUMATREX Take 6 tablets (15 mg total) by mouth once a week. 3 tablets am and at bedtime twice a day once a week,  Saturday's Start taking on: August 01, 2021   montelukast 10 MG tablet Commonly known as: SINGULAIR Take 10 mg by mouth at bedtime.   NovoLOG 100 UNIT/ML injection Generic drug: insulin aspart Inject into the skin See admin instructions. Pt has Insulin pump   ondansetron 4 MG tablet Commonly known as: ZOFRAN Take  1 tablet (4 mg total) by mouth every 6 (six) hours as needed for nausea.   pregabalin 75 MG capsule Commonly known as: LYRICA Take 75-150 mg by mouth 3 (three) times daily. Takes one cap in am and lunch, two cap at bedtime   ProAir Digihaler 108 (90 Base) MCG/ACT Aepb Generic drug: Albuterol Sulfate (sensor) Inhale into the lungs as needed.   albuterol (2.5 MG/3ML) 0.083% nebulizer solution Commonly known as: PROVENTIL Take 3 mLs (2.5 mg total) by nebulization every 4 (four) hours as needed for wheezing or shortness of breath.   rosuvastatin 20 MG tablet Commonly known as: CRESTOR Take 20 mg by mouth at bedtime.   sertraline 25 MG tablet Commonly known as: ZOLOFT Take 25 mg by mouth daily.   Vitamin D (Ergocalciferol) 1.25 MG (50000 UNIT) Caps capsule Commonly known as: DRISDOL Take 50,000 Units by mouth every 7 (seven) days. Saturday's        Follow-up Information     Avva, Ravisankar, MD. Schedule an appointment as soon as possible for a visit in 1 week(s).   Specialty: Internal Medicine Why: With repeat CBC/BMP Contact information: [place] [address] [phone]          Lorretta Harp, MD .   Specialties: Cardiology, Radiology Contact information: [address] [place] [address] [phone]         Billey Co, MD. Schedule an appointment as soon as possible for a visit in 1 week(s).   Specialty: Urology Contact information: [place] [address] [phone]                Subjective: Patient seen and examined at bedside.  Feels much better and wants to go home today.  Denies overnight fever or vomiting  Discharge Exam: Filed Weights   07/22/21 2012 07/23/21 0157 07/24/21 0124  Weight: 81.8 kg 76 kg 80.9 kg   General: On room air.  No distress.   respiratory: Decreased breath sounds at bases bilaterally, no wheezing CVS: Rate controlled; S1-S2 heard Abdominal: Soft, obese, nontender, nondistended, no organomegaly, bowel sounds heard Extremities: Mild lower extremity edema; no clubbing  Condition at discharge: good  The results of significant diagnostics from this hospitalization (including imaging, microbiology, ancillary and laboratory) are listed below for reference.   Imaging Studies: DG Chest Port 1 View  Result Date: 07/22/2021 CLINICAL DATA:  Sepsis. EXAM: PORTABLE CHEST 1 VIEW COMPARISON:  Chest radiograph dated 12/22/2020. FINDINGS: No focal consolidation, pleural effusion, pneumothorax. The cardiac silhouette is within limits. No acute osseous pathology. IMPRESSION: No active disease. Electronically Signed   By: Anner Crete M.D.   On: 07/22/2021 20:44   DG OR UROLOGY CYSTO IMAGE ([facility] ONLY)  Result Date: 07/22/2021 There is no interpretation for this exam.  This order is for images obtained during a surgical procedure.  Please See "Surgeries" Tab for more information regarding the procedure.   CT Renal Stone Study  Result Date: 07/22/2021 CLINICAL DATA:  Flank pain. Kidney stone suspected. Weakness, chills, nausea and shortness of breath. EXAM: CT ABDOMEN AND PELVIS  WITHOUT CONTRAST TECHNIQUE: Multidetector CT imaging of the abdomen and pelvis was performed following the standard protocol without IV contrast. RADIATION DOSE REDUCTION: This exam was performed according to the departmental dose-optimization program which includes automated exposure control, adjustment of the mA and/or kV according to patient size and/or use of iterative reconstruction technique. COMPARISON:  01/07/2010 FINDINGS: Lower chest: Mild dependent atelectasis in the right lower  lobe. Hepatobiliary: Liver appears normal without contrast. No calcified gallstones. Pancreas: Normal Spleen: Normal Adrenals/Urinary Tract: Adrenal glands are normal. Left kidney contains numerous nonobstructing calculi. The largest in the lower pole measure 5 mm. No hydronephrosis on the left. The right kidney contains a nonobstructing 4 mm stone in the lower pole. There is hydroureteronephrosis on the right with the ureter dilated to the level just proximal to the UVJ. There are 3 stones in the distal ureter. There is a 2 mm stone at the UVJ. There is a 7-8 mm stone just proximal to that. There is a 2 mm stone just proximal to that. No stone in the bladder. Stomach/Bowel: Stomach and small intestine are normal. Normal appendix. Normal colon. Vascular/Lymphatic: Aortic atherosclerosis. No aneurysm. Aortoiliac stents are present. IVC is normal. No adenopathy. Reproductive: Previous hysterectomy.  No pelvic mass. Other: No free fluid or air. Small umbilical hernia containing only fat. Musculoskeletal: Normal IMPRESSION: Hydroureteronephrosis on the right. Three stones in the distal ureter. 2 mm stone at the right UVJ. 7-8 mm stone just proximal to that. 2 mm stone just proximal to the left. Nonobstructing 4 mm stone in the lower pole the right kidney. Multiple nonobstructing stones in the left kidney, the largest in the lower pole measuring 5 mm. Aortic atherosclerosis.  Bilateral aortoiliac stents. Electronically Signed   By:  Nelson Chimes M.D.   On: 07/22/2021 21:50    Microbiology: Results for orders placed or performed during the hospital encounter of 07/22/21  Blood Culture (routine x 2)     Status: None (Preliminary result)   Collection Time: 07/22/21  8:06 PM   Specimen: BLOOD  Result Value Ref Range Status   Specimen Description BLOOD BLOOD LEFT FOREARM  Final   Special Requests   Final    BOTTLES DRAWN AEROBIC AND ANAEROBIC Blood Culture adequate volume   Culture   Final    NO GROWTH 3 DAYS Performed at [facility], [address] [place], [place] [address]    Report Status PENDING  Incomplete  Blood Culture (routine x 2)     Status: Abnormal   Collection Time: 07/22/21  8:06 PM   Specimen: BLOOD  Result Value Ref Range Status   Specimen Description   Final    BLOOD BLOOD RIGHT HAND Performed at [facility], [address] [place], [place] [address]    Special Requests   Final    BOTTLES DRAWN AEROBIC AND ANAEROBIC Blood Culture adequate volume Performed at [facility], [address] [place], [place] [address]    Culture  Setup Time   Final    IN BOTH AEROBIC AND ANAEROBIC BOTTLES GRAM NEGATIVE RODS CRITICAL RESULT CALLED TO, READ BACK BY AND VERIFIED WITH: CARLONE CHILDS '@0925'  07/23/21 MJU CRITICAL VALUE NOTED.  VALUE IS CONSISTENT WITH PREVIOUSLY REPORTED AND CALLED VALUE. NATHAN BELUE AT 6734 07/24/2021 GAA Performed at [facility], [address] [place], [place] [address]    Culture ESCHERICHIA COLI (A)  Final   Report Status 07/25/2021 FINAL  Final   Organism ID, Bacteria ESCHERICHIA COLI  Final      Susceptibility   Escherichia coli - MIC*    AMPICILLIN >=32 RESISTANT Resistant     CEFAZOLIN 8 SENSITIVE Sensitive     CEFEPIME <=0.12 SENSITIVE Sensitive     CEFTAZIDIME <=1 SENSITIVE Sensitive     CEFTRIAXONE <=0.25 SENSITIVE Sensitive     CIPROFLOXACIN <=0.25 SENSITIVE Sensitive     GENTAMICIN <=1 SENSITIVE Sensitive  IMIPENEM  <=0.25 SENSITIVE Sensitive     TRIMETH/SULFA <=20 SENSITIVE Sensitive     AMPICILLIN/SULBACTAM 16 INTERMEDIATE Intermediate     PIP/TAZO <=4 SENSITIVE Sensitive     * ESCHERICHIA COLI  Blood Culture ID Panel (Reflexed)     Status: Abnormal   Collection Time: 07/22/21  8:06 PM  Result Value Ref Range Status   Enterococcus faecalis NOT DETECTED NOT DETECTED Final   Enterococcus Faecium NOT DETECTED NOT DETECTED Final   Listeria monocytogenes NOT DETECTED NOT DETECTED Final   Staphylococcus species NOT DETECTED NOT DETECTED Final   Staphylococcus aureus (BCID) NOT DETECTED NOT DETECTED Final   Staphylococcus epidermidis NOT DETECTED NOT DETECTED Final   Staphylococcus lugdunensis NOT DETECTED NOT DETECTED Final   Streptococcus species NOT DETECTED NOT DETECTED Final   Streptococcus agalactiae NOT DETECTED NOT DETECTED Final   Streptococcus pneumoniae NOT DETECTED NOT DETECTED Final   Streptococcus pyogenes NOT DETECTED NOT DETECTED Final   A.calcoaceticus-baumannii NOT DETECTED NOT DETECTED Final   Bacteroides fragilis NOT DETECTED NOT DETECTED Final   Enterobacterales DETECTED (A) NOT DETECTED Final    Comment: Enterobacterales represent a large order of gram negative bacteria, not a single organism. CRITICAL RESULT CALLED TO, READ BACK BY AND VERIFIED WITH: CAROLINE CHILDS '@0925'  07/23/21 MJU    Enterobacter cloacae complex NOT DETECTED NOT DETECTED Final   Escherichia coli DETECTED (A) NOT DETECTED Final    Comment: CRITICAL RESULT CALLED TO, READ BACK BY AND VERIFIED WITH: CAROLINE CHILDS '@0925'  07/23/21 MJU    Klebsiella aerogenes NOT DETECTED NOT DETECTED Final   Klebsiella oxytoca NOT DETECTED NOT DETECTED Final   Klebsiella pneumoniae NOT DETECTED NOT DETECTED Final   Proteus species NOT DETECTED NOT DETECTED Final   Salmonella species NOT DETECTED NOT DETECTED Final   Serratia marcescens NOT DETECTED NOT DETECTED Final   Haemophilus influenzae NOT DETECTED NOT DETECTED Final    Neisseria meningitidis NOT DETECTED NOT DETECTED Final   Pseudomonas aeruginosa NOT DETECTED NOT DETECTED Final   Stenotrophomonas maltophilia NOT DETECTED NOT DETECTED Final   Candida albicans NOT DETECTED NOT DETECTED Final   Candida auris NOT DETECTED NOT DETECTED Final   Candida glabrata NOT DETECTED NOT DETECTED Final   Candida krusei NOT DETECTED NOT DETECTED Final   Candida parapsilosis NOT DETECTED NOT DETECTED Final   Candida tropicalis NOT DETECTED NOT DETECTED Final   Cryptococcus neoformans/gattii NOT DETECTED NOT DETECTED Final   CTX-M ESBL NOT DETECTED NOT DETECTED Final   Carbapenem resistance IMP NOT DETECTED NOT DETECTED Final   Carbapenem resistance KPC NOT DETECTED NOT DETECTED Final   Carbapenem resistance NDM NOT DETECTED NOT DETECTED Final   Carbapenem resist OXA 48 LIKE NOT DETECTED NOT DETECTED Final   Carbapenem resistance VIM NOT DETECTED NOT DETECTED Final    Comment: Performed at [facility], [address] [place], [place] [address]  Resp Panel by RT-PCR (Flu A&B, Covid) Nasopharyngeal Swab     Status: None   Collection Time: 07/22/21  9:26 PM   Specimen: Nasopharyngeal Swab; Nasopharyngeal(NP) swabs in vial transport medium  Result Value Ref Range Status   SARS Coronavirus 2 by RT PCR NEGATIVE NEGATIVE Final    Comment: (NOTE) SARS-CoV-2 target nucleic acids are NOT DETECTED.  The SARS-CoV-2 RNA is generally detectable in upper respiratory specimens during the acute phase of infection. The lowest concentration of SARS-CoV-2 viral copies this assay can detect is 138 copies/mL. A negative result does not preclude SARS-Cov-2 infection and should not be used as the  sole basis for treatment or other patient management decisions. A negative result may occur with  improper specimen collection/handling, submission of specimen other than nasopharyngeal swab, presence of viral mutation(s) within the areas targeted by this assay, and inadequate number  of viral copies(<138 copies/mL). A negative result must be combined with clinical observations, patient history, and epidemiological information. The expected result is Negative.  Fact Sheet for Patients:  EntrepreneurPulse.com.au  Fact Sheet for Healthcare Providers:  IncredibleEmployment.be  This test is no t yet approved or cleared by the [place] FDA and  has been authorized for detection and/or diagnosis of SARS-CoV-2 by FDA under an Emergency Use Authorization (EUA). This EUA will remain  in effect (meaning this test can be used) for the duration of the COVID-19 declaration under Section 564(b)(1) of the Act, 21 U.S.C.section 360bbb-3(b)(1), unless the authorization is terminated  or revoked sooner.       Influenza A by PCR NEGATIVE NEGATIVE Final   Influenza B by PCR NEGATIVE NEGATIVE Final    Comment: (NOTE) The Xpert Xpress SARS-CoV-2/FLU/RSV plus assay is intended as an aid in the diagnosis of influenza from Nasopharyngeal swab specimens and should not be used as a sole basis for treatment. Nasal washings and aspirates are unacceptable for Xpert Xpress SARS-CoV-2/FLU/RSV testing.  Fact Sheet for Patients: EntrepreneurPulse.com.au  Fact Sheet for Healthcare Providers: IncredibleEmployment.be  This test is not yet approved or cleared by the [place] FDA and has been authorized for detection and/or diagnosis of SARS-CoV-2 by FDA under an Emergency Use Authorization (EUA). This EUA will remain in effect (meaning this test can be used) for the duration of the COVID-19 declaration under Section 564(b)(1) of the Act, 21 U.S.C. section 360bbb-3(b)(1), unless the authorization is terminated or revoked.  Performed at [facility], [address] [place], [place] [address]   Urine Culture     Status: Abnormal   Collection Time: 07/22/21 10:17 PM   Specimen: In/Out Cath Urine   Result Value Ref Range Status   Specimen Description   Final    IN/OUT CATH URINE Performed at [facility], [address] [place], [place] [address]    Special Requests   Final    NONE Performed at [facility], [address] [place],  [address]    Culture 10,000 COLONIES/mL ESCHERICHIA COLI (A)  Final   Report Status 07/25/2021 FINAL  Final   Organism ID, Bacteria ESCHERICHIA COLI (A)  Final      Susceptibility   Escherichia coli - MIC*    AMPICILLIN >=32 RESISTANT Resistant     CEFAZOLIN 8 SENSITIVE Sensitive     CEFEPIME <=0.12 SENSITIVE Sensitive     CEFTRIAXONE 0.5 SENSITIVE Sensitive     CIPROFLOXACIN <=0.25 SENSITIVE Sensitive     GENTAMICIN <=1 SENSITIVE Sensitive     IMIPENEM <=0.25 SENSITIVE Sensitive     NITROFURANTOIN <=16 SENSITIVE Sensitive     TRIMETH/SULFA <=20 SENSITIVE Sensitive     AMPICILLIN/SULBACTAM >=32 RESISTANT Resistant     PIP/TAZO <=4 SENSITIVE Sensitive     * 10,000 COLONIES/mL ESCHERICHIA COLI    Labs: CBC: Recent Labs  Lab 07/22/21 2006 07/23/21 0450 07/24/21 0210 07/25/21 0421  WBC 18.2* 13.3* 14.6* 9.0  NEUTROABS 15.6*  --  12.4* 5.9  HGB 11.7* 10.3* 9.4* 9.2*  HCT 35.5* 32.1* 30.0* 29.0*  MCV 87.9 91.5 90.9 89.0  PLT 225 200 227 427   Basic Metabolic Panel: Recent Labs  Lab 07/22/21 2006 07/23/21 0450 07/23/21 0623  07/23/21 1035 07/23/21 1403 07/23/21 1815 07/23/21 2226 07/24/21 0210 07/24/21 0607 07/25/21 0421  NA 135 134* 135  --   --   --   --  138  --  140  K 2.5* 6.2* 4.0 3.6   < > 3.8 3.3* 3.6 3.7 3.2*  CL 103 109 107  --   --   --   --  109  --  111  CO2 20* 18* 19*  --   --   --   --  20*  --  24  GLUCOSE 208* 458* 386* 321*  --   --   --  246*  --  303*  BUN '17 18 19  ' --   --   --   --  20  --  16  CREATININE 1.16* 1.04* 1.16*  --   --   --   --  0.86  --  1.07*  CALCIUM 9.0 7.9* 8.8*  --   --   --   --  8.3*  --  8.3*  MG 1.7  --   --   --   --   --   --  1.9  --  1.9   < > =  values in this interval not displayed.   Liver Function Tests: Recent Labs  Lab 07/22/21 2006  AST 29  ALT 20  ALKPHOS 69  BILITOT 0.8  PROT 6.7  ALBUMIN 3.6   CBG: Recent Labs  Lab 07/24/21 0803 07/24/21 1216 07/24/21 1604 07/24/21 2121 07/25/21 0808  GLUCAP 238* 285* 248* 244* 243*    Discharge time spent: greater than 30 minutes.  Signed: Aline August, MD [facility] 07/25/2021

## 2021-07-25 NOTE — TOC Progression Note (Signed)
Transition of Care [facility]) - Progression Note    Patient Details  Name: Ashley [name] MRN: [id number] Date of Birth: 1965/10/09  [facility] CM/SW Contact  Eileen Stanford, LCSW Phone Number: 07/25/2021, 10:45 AM  Clinical Narrative:   Pt to dc home today, no needs.    Expected Discharge Plan: Home/Self Care Barriers to Discharge: Continued Medical Work up  Expected Discharge Plan and Services Expected Discharge Plan: Home/Self Care       Living arrangements for the past 2 months: Single Family Home Expected Discharge Date: 07/25/21                                     Social Determinants of Health (SDOH) Interventions    Readmission Risk Interventions Readmission Risk Prevention Plan 07/23/2021  Transportation Screening Complete  PCP or Specialist Appt within 3-5 Days Complete  HRI or [place] Complete  Social Work Consult for [place] Planning/Counseling Complete  Palliative Care Screening Not Applicable  Medication Review [employer] photographer) Complete  Some recent data might be hidden

## 2021-07-25 NOTE — Progress Notes (Signed)
Surgical Physician Order Form [facility] [place]  * Scheduling expectation :  3 to 4 weeks  *Length of Case: 1 hour  *Clearance needed: no  *Anticoagulation Instructions: May continue all anticoagulants  *Aspirin Instructions: Ok to continue all  *Post-op visit Date/Instructions: TBD  *Diagnosis: Right Ureteral Stone  *Procedure: right  Ureteroscopy w/laser lithotripsy & stent exchange (95621)   Additional orders: N/A  -Admit type: OUTpatient  -Anesthesia: General  -VTE Prophylaxis Standing Order SCDs       Other:   -Standing Lab Orders Per Anesthesia    Lab other: None  -Standing Test orders EKG/Chest x-ray per Anesthesia       Test other:   - Medications:  Ancef 2gm IV  -Other orders:  N/A

## 2021-07-25 NOTE — Care Management Important Message (Signed)
Important Message  Patient Details  Name: Ashley [name] MRN: [id number] Date of Birth: 06/21/1965   Medicare Important Message Given:  N/A - LOS <3 / Initial given by admissions     [name] 07/25/2021, 10:58 AM

## 2021-07-25 NOTE — Progress Notes (Signed)
Patient is being discharged home to self care with follow up appts. IV has been removed. Discharge instruction given and patient educated on MD instructions.

## 2021-07-27 LAB — CULTURE, BLOOD (ROUTINE X 2): Special Requests: ADEQUATE

## 2021-07-28 NOTE — Progress Notes (Signed)
Left message for patient to return my call.

## 2021-08-01 ENCOUNTER — Telehealth: Payer: Self-pay

## 2021-08-01 DIAGNOSIS — E785 Hyperlipidemia, unspecified: Secondary | ICD-10-CM | POA: Diagnosis not present

## 2021-08-01 DIAGNOSIS — E1040 Type 1 diabetes mellitus with diabetic neuropathy, unspecified: Secondary | ICD-10-CM | POA: Diagnosis not present

## 2021-08-01 DIAGNOSIS — Z4681 Encounter for fitting and adjustment of insulin pump: Secondary | ICD-10-CM | POA: Diagnosis not present

## 2021-08-01 DIAGNOSIS — F4321 Adjustment disorder with depressed mood: Secondary | ICD-10-CM | POA: Diagnosis not present

## 2021-08-01 DIAGNOSIS — Z794 Long term (current) use of insulin: Secondary | ICD-10-CM | POA: Diagnosis not present

## 2021-08-01 DIAGNOSIS — I10 Essential (primary) hypertension: Secondary | ICD-10-CM | POA: Diagnosis not present

## 2021-08-01 DIAGNOSIS — A419 Sepsis, unspecified organism: Secondary | ICD-10-CM | POA: Diagnosis not present

## 2021-08-01 NOTE — Telephone Encounter (Signed)
I spoke with Ashley [name]. We have discussed possible surgery dates and Friday March 3rd, 2023 was agreed upon by all parties. Patient given information about surgery date, what to expect pre-operatively and post operatively.   We discussed that a Pre-Admission Testing office will be calling to set up the pre-op visit that will take place prior to surgery, and that these appointments are typically done over the phone with a Pre-Admissions RN.   Informed patient that our office will communicate any additional care to be provided after surgery. Patients questions or concerns were discussed during our call. Advised to call our office should there be any additional information, questions or concerns that arise. Patient verbalized understanding.

## 2021-08-01 NOTE — Progress Notes (Signed)
[facility] Posting Form   Surgery Date/Time: Date: 08/11/2021  Surgeon: Dr. Nickolas Madrid, MD  Surgery Location: Day Surgery  Inpt ( No  )   Outpt (Yes)   Obs ( No  )   Diagnosis: N20.1 Right Ureteral Stone  -CPT: [phone]  Surgery: Right Ureteroscopy with laser lithotripsy and stent exchange  Stop Anticoagulations: No, may continue ASA  Cardiac/Medical/Pulmonary Clearance needed: no  *Orders entered into EPIC  Date: 08/01/21   *Case booked in [place]  Date: 07/31/21  *Notified pt of Surgery: Date: 07/31/2021  PRE-OP UA & CX: no  *Placed into Prior Authorization Work Que Date: 08/01/21   Assistant/laser/rep:No

## 2021-08-03 DIAGNOSIS — I10 Essential (primary) hypertension: Secondary | ICD-10-CM | POA: Diagnosis not present

## 2021-08-04 ENCOUNTER — Other Ambulatory Visit: Payer: Self-pay

## 2021-08-04 ENCOUNTER — Ambulatory Visit: Payer: Medicare HMO | Admitting: Pulmonary Disease

## 2021-08-04 ENCOUNTER — Other Ambulatory Visit
Admission: RE | Admit: 2021-08-04 | Discharge: 2021-08-04 | Disposition: A | Discharge status: Home / Self Care | Payer: Medicare HMO | Source: Ambulatory Visit | Attending: Urology | Admitting: Urology

## 2021-08-04 HISTORY — DX: Dyspnea, unspecified: R06.00

## 2021-08-04 HISTORY — DX: Rheumatoid arthritis, unspecified: M06.9

## 2021-08-04 HISTORY — DX: Pneumonia, unspecified organism: J18.9

## 2021-08-04 NOTE — Patient Instructions (Addendum)
Your procedure is scheduled on:08/11/21 - Friday Report to the Registration Desk on the 1st floor of the [facility]. To find out your arrival time, please call [phone] between 1PM - 3PM on: 08/10/21 - Thursday  REMEMBER: Instructions that are not followed completely may result in serious medical risk, up to and including death; or upon the discretion of your surgeon and anesthesiologist your surgery may need to be rescheduled.  Do not eat food or drink any fluids after midnight the night before surgery.  No gum chewing, lozengers or hard candies.  TAKE THESE MEDICATIONS THE MORNING OF SURGERY WITH A SIP OF WATER:   -lansoprazole (PREVACID) 30 MG capsule , take 1 capsule the morning of and 1 the night before surgery. - metoprolol succinate (TOPROL-XL) 50 MG 24 hr tablet - levothyroxine (SYNTHROID) 125 MCG tablet - pregabalin (LYRICA) 75 MG capsule - Budeson-Glycopyrrol-Formoterol (BREZTRI AEROSPHERE) 160-9-4.8 MCG/ACT AERO  Use Albuterol Sulfate, sensor, (PROAIR DIGIHALER) 108 (90 Base) MCG/ACT AEPB on the day of surgery and bring to the hospital.  Follow recommendations from Cardiologist, Pulmonologist or PCP regarding stopping Aspirin, Coumadin, Plavix, Eliquis, Pradaxa, or Pletal. Will not take the day of surgery.  One week prior to surgery: Stop Anti-inflammatories (NSAIDS) such as Advil, Aleve, Ibuprofen, Motrin, Naproxen, Naprosyn and Aspirin based products such as Excedrin, Goodys Powder, BC Powder.  Stop ANY OVER THE COUNTER supplements until after surgery.  You may however, continue to take Tylenol if needed for pain up until the day of surgery.  No Alcohol for 24 hours before or after surgery.  No Smoking including e-cigarettes for 24 hours prior to surgery.  No chewable tobacco products for at least 6 hours prior to surgery.  No nicotine patches on the day of surgery.  Do not use any "recreational" drugs for at least a week prior to your surgery.  Please be  advised that the combination of cocaine and anesthesia may have negative outcomes, up to and including death. If you test positive for cocaine, your surgery will be cancelled.  On the morning of surgery brush your teeth with toothpaste and water, you may rinse your mouth with mouthwash if you wish. Do not swallow any toothpaste or mouthwash.  Do not wear jewelry, make-up, hairpins, clips or nail polish.  Do not wear lotions, powders, or perfumes.   Do not shave body from the neck down 48 hours prior to surgery just in case you cut yourself which could leave a site for infection.  Also, freshly shaved skin may become irritated if using the CHG soap.  Contact lenses, hearing aids and dentures may not be worn into surgery.  Do not bring valuables to the hospital. [facility] is not responsible for any missing/lost belongings or valuables.   Notify your doctor if there is any change in your medical condition (cold, fever, infection).  Wear comfortable clothing (specific to your surgery type) to the hospital.  After surgery, you can help prevent lung complications by doing breathing exercises.  Take deep breaths and cough every 1-2 hours. Your doctor may order a device called an Incentive Spirometer to help you take deep breaths. When coughing or sneezing, hold a pillow firmly against your incision with both hands. This is called splinting. Doing this helps protect your incision. It also decreases belly discomfort.  If you are being admitted to the hospital overnight, leave your suitcase in the car. After surgery it may be brought to your room.  If you are being discharged the  day of surgery, you will not be allowed to drive home. You will need a responsible adult (18 years or older) to drive you home and stay with you that night.   If you are taking public transportation, you will need to have a responsible adult (18 years or older) with you. Please confirm with your physician that it  is acceptable to use public transportation.   Please call the [facility] at [phone] if you have any questions about these instructions.  Surgery Visitation Policy:  Patients undergoing a surgery or procedure may have one family member or support person with them as long as that person is not COVID-19 positive or experiencing its symptoms.  That person may remain in the waiting area during the procedure and may rotate out with other people.  Inpatient Visitation:    Visiting hours are 7 a.m. to 8 p.m. Up to two visitors ages 16+ are allowed at one time in a patient room. The visitors may rotate out with other people during the day. Visitors must check out when they leave, or other visitors will not be allowed. One designated support person may remain overnight. The visitor must pass COVID-19 screenings, use hand sanitizer when entering and exiting the patients room and wear a mask at all times, including in the patients room. Patients must also wear a mask when staff or their visitor are in the room. Masking is required regardless of vaccination status.

## 2021-08-04 NOTE — Pre-Procedure Instructions (Addendum)
Patient is Type 1 diabetic and has metronic Insulin pump with meter, per patient with surgery being approximately 1 hour long, she will keep her pump in place and manually dose herself based on her BS, DM coordinator Harvel Ricks made aware by this writer, she agrees with patient that pump may remain intact during surgery. Patient was advise to notify Etta Grandchild PA whom manages her Insulin Pump about her upcoming surgery and her plan.

## 2021-08-09 ENCOUNTER — Ambulatory Visit: Payer: Medicare HMO | Admitting: Urology

## 2021-08-11 ENCOUNTER — Encounter: Payer: Self-pay | Admitting: Urology

## 2021-08-11 ENCOUNTER — Encounter
Admission: RE | Disposition: A | Discharge status: Home / Self Care | Payer: Self-pay | Source: Ambulatory Visit | Attending: Urology

## 2021-08-11 ENCOUNTER — Ambulatory Visit: Payer: Medicare HMO

## 2021-08-11 ENCOUNTER — Ambulatory Visit
Admission: RE | Admit: 2021-08-11 | Discharge: 2021-08-11 | Disposition: A | Discharge status: Home / Self Care | Payer: Medicare HMO | Source: Ambulatory Visit | Attending: Urology | Admitting: Urology

## 2021-08-11 ENCOUNTER — Ambulatory Visit: Payer: Medicare HMO | Admitting: Certified Registered Nurse Anesthetist

## 2021-08-11 ENCOUNTER — Other Ambulatory Visit: Payer: Self-pay

## 2021-08-11 DIAGNOSIS — N132 Hydronephrosis with renal and ureteral calculous obstruction: Secondary | ICD-10-CM | POA: Diagnosis not present

## 2021-08-11 DIAGNOSIS — Z9989 Dependence on other enabling machines and devices: Secondary | ICD-10-CM | POA: Diagnosis not present

## 2021-08-11 DIAGNOSIS — E669 Obesity, unspecified: Secondary | ICD-10-CM | POA: Diagnosis not present

## 2021-08-11 DIAGNOSIS — M069 Rheumatoid arthritis, unspecified: Secondary | ICD-10-CM | POA: Diagnosis present

## 2021-08-11 DIAGNOSIS — K219 Gastro-esophageal reflux disease without esophagitis: Secondary | ICD-10-CM | POA: Diagnosis not present

## 2021-08-11 DIAGNOSIS — E860 Dehydration: Secondary | ICD-10-CM | POA: Diagnosis present

## 2021-08-11 DIAGNOSIS — J449 Chronic obstructive pulmonary disease, unspecified: Secondary | ICD-10-CM | POA: Diagnosis not present

## 2021-08-11 DIAGNOSIS — N202 Calculus of kidney with calculus of ureter: Secondary | ICD-10-CM | POA: Diagnosis present

## 2021-08-11 DIAGNOSIS — E039 Hypothyroidism, unspecified: Secondary | ICD-10-CM | POA: Diagnosis not present

## 2021-08-11 DIAGNOSIS — N201 Calculus of ureter: Secondary | ICD-10-CM

## 2021-08-11 DIAGNOSIS — B9620 Unspecified Escherichia coli [E. coli] as the cause of diseases classified elsewhere: Secondary | ICD-10-CM | POA: Diagnosis present

## 2021-08-11 DIAGNOSIS — R0689 Other abnormalities of breathing: Secondary | ICD-10-CM | POA: Diagnosis not present

## 2021-08-11 DIAGNOSIS — R0902 Hypoxemia: Secondary | ICD-10-CM | POA: Diagnosis not present

## 2021-08-11 DIAGNOSIS — E785 Hyperlipidemia, unspecified: Secondary | ICD-10-CM | POA: Diagnosis present

## 2021-08-11 DIAGNOSIS — N390 Urinary tract infection, site not specified: Secondary | ICD-10-CM | POA: Diagnosis not present

## 2021-08-11 DIAGNOSIS — E876 Hypokalemia: Secondary | ICD-10-CM | POA: Diagnosis not present

## 2021-08-11 DIAGNOSIS — M810 Age-related osteoporosis without current pathological fracture: Secondary | ICD-10-CM | POA: Diagnosis present

## 2021-08-11 DIAGNOSIS — N134 Hydroureter: Secondary | ICD-10-CM | POA: Diagnosis not present

## 2021-08-11 DIAGNOSIS — R918 Other nonspecific abnormal finding of lung field: Secondary | ICD-10-CM | POA: Diagnosis not present

## 2021-08-11 DIAGNOSIS — Z9889 Other specified postprocedural states: Secondary | ICD-10-CM | POA: Diagnosis not present

## 2021-08-11 DIAGNOSIS — T8389XA Other specified complication of genitourinary prosthetic devices, implants and grafts, initial encounter: Secondary | ICD-10-CM | POA: Diagnosis not present

## 2021-08-11 DIAGNOSIS — I739 Peripheral vascular disease, unspecified: Secondary | ICD-10-CM | POA: Diagnosis not present

## 2021-08-11 DIAGNOSIS — Z20822 Contact with and (suspected) exposure to covid-19: Secondary | ICD-10-CM | POA: Diagnosis present

## 2021-08-11 DIAGNOSIS — E1065 Type 1 diabetes mellitus with hyperglycemia: Secondary | ICD-10-CM | POA: Diagnosis present

## 2021-08-11 DIAGNOSIS — K760 Fatty (change of) liver, not elsewhere classified: Secondary | ICD-10-CM | POA: Diagnosis present

## 2021-08-11 DIAGNOSIS — E1043 Type 1 diabetes mellitus with diabetic autonomic (poly)neuropathy: Secondary | ICD-10-CM | POA: Diagnosis present

## 2021-08-11 DIAGNOSIS — Z794 Long term (current) use of insulin: Secondary | ICD-10-CM | POA: Diagnosis not present

## 2021-08-11 DIAGNOSIS — E1165 Type 2 diabetes mellitus with hyperglycemia: Secondary | ICD-10-CM | POA: Diagnosis not present

## 2021-08-11 DIAGNOSIS — Z8616 Personal history of COVID-19: Secondary | ICD-10-CM | POA: Diagnosis not present

## 2021-08-11 DIAGNOSIS — F32A Depression, unspecified: Secondary | ICD-10-CM | POA: Diagnosis present

## 2021-08-11 DIAGNOSIS — G4733 Obstructive sleep apnea (adult) (pediatric): Secondary | ICD-10-CM | POA: Diagnosis not present

## 2021-08-11 DIAGNOSIS — N136 Pyonephrosis: Secondary | ICD-10-CM | POA: Diagnosis present

## 2021-08-11 DIAGNOSIS — A419 Sepsis, unspecified organism: Secondary | ICD-10-CM | POA: Diagnosis not present

## 2021-08-11 DIAGNOSIS — R000 Tachycardia, unspecified: Secondary | ICD-10-CM | POA: Diagnosis not present

## 2021-08-11 DIAGNOSIS — N1330 Unspecified hydronephrosis: Secondary | ICD-10-CM | POA: Diagnosis not present

## 2021-08-11 DIAGNOSIS — R509 Fever, unspecified: Secondary | ICD-10-CM | POA: Diagnosis present

## 2021-08-11 DIAGNOSIS — Z6833 Body mass index (BMI) 33.0-33.9, adult: Secondary | ICD-10-CM | POA: Diagnosis not present

## 2021-08-11 DIAGNOSIS — K3184 Gastroparesis: Secondary | ICD-10-CM | POA: Diagnosis present

## 2021-08-11 DIAGNOSIS — E1042 Type 1 diabetes mellitus with diabetic polyneuropathy: Secondary | ICD-10-CM | POA: Diagnosis present

## 2021-08-11 DIAGNOSIS — I10 Essential (primary) hypertension: Secondary | ICD-10-CM | POA: Diagnosis not present

## 2021-08-11 DIAGNOSIS — E1051 Type 1 diabetes mellitus with diabetic peripheral angiopathy without gangrene: Secondary | ICD-10-CM | POA: Diagnosis present

## 2021-08-11 HISTORY — PX: CYSTOSCOPY/URETEROSCOPY/HOLMIUM LASER/STENT PLACEMENT: SHX6546

## 2021-08-11 LAB — GLUCOSE, CAPILLARY
Glucose-Capillary: 345 mg/dL — ABNORMAL HIGH (ref 70–99)
Glucose-Capillary: 156 mg/dL — ABNORMAL HIGH (ref 70–99)

## 2021-08-11 SURGERY — CYSTOSCOPY/URETEROSCOPY/HOLMIUM LASER/STENT PLACEMENT
Anesthesia: General | Laterality: Right

## 2021-08-11 MED ORDER — PHENYLEPHRINE HCL (PRESSORS) 10 MG/ML IV SOLN
INTRAVENOUS | Status: DC | PRN
  Administered 2021-08-11: 80 ug via INTRAVENOUS
  Administered 2021-08-11 (×3): 120 ug via INTRAVENOUS

## 2021-08-11 MED ORDER — KETOROLAC TROMETHAMINE 30 MG/ML IJ SOLN
INTRAMUSCULAR | Status: DC | PRN
  Administered 2021-08-11: 15 mg via INTRAVENOUS

## 2021-08-11 MED ORDER — FENTANYL CITRATE (PF) 100 MCG/2ML IJ SOLN
INTRAMUSCULAR | Status: AC
Start: 2021-08-11 — End: ?
  Filled 2021-08-11: qty 2

## 2021-08-11 MED ORDER — APREPITANT 40 MG PO CAPS
ORAL_CAPSULE | ORAL | Status: AC
  Administered 2021-08-11: 40 mg via ORAL
  Filled 2021-08-11: qty 1

## 2021-08-11 MED ORDER — HYDROCODONE-ACETAMINOPHEN 5-325 MG PO TABS: 1.0000 | ORAL_TABLET | Freq: Four times a day (QID) | ORAL | 0 refills | Status: DC | PRN

## 2021-08-11 MED ORDER — ONDANSETRON HCL 4 MG/2ML IJ SOLN
INTRAMUSCULAR | Status: DC | PRN
  Administered 2021-08-11: 4 mg via INTRAVENOUS

## 2021-08-11 MED ORDER — MIDAZOLAM HCL 2 MG/2ML IJ SOLN
INTRAMUSCULAR | Status: AC
  Filled 2021-08-11: qty 2

## 2021-08-11 MED ORDER — SODIUM CHLORIDE 0.9 % IV SOLN
INTRAVENOUS | Status: DC
Start: 2021-08-11 — End: 2021-08-11

## 2021-08-11 MED ORDER — MIDAZOLAM HCL 2 MG/2ML IJ SOLN
INTRAMUSCULAR | Status: DC | PRN
Start: 2021-08-11 — End: 2021-08-11
  Administered 2021-08-11: 2 mg via INTRAVENOUS

## 2021-08-11 MED ORDER — ROCURONIUM BROMIDE 100 MG/10ML IV SOLN
INTRAVENOUS | Status: DC | PRN
Start: 2021-08-11 — End: 2021-08-11
  Administered 2021-08-11: 40 mg via INTRAVENOUS
  Administered 2021-08-11: 5 mg via INTRAVENOUS

## 2021-08-11 MED ORDER — ROCURONIUM BROMIDE 10 MG/ML (PF) SYRINGE
PREFILLED_SYRINGE | INTRAVENOUS | Status: AC
  Filled 2021-08-11: qty 10

## 2021-08-11 MED ORDER — FENTANYL CITRATE (PF) 100 MCG/2ML IJ SOLN
INTRAMUSCULAR | Status: DC | PRN
  Administered 2021-08-11 (×2): 50 ug via INTRAVENOUS

## 2021-08-11 MED ORDER — DEXAMETHASONE SODIUM PHOSPHATE 10 MG/ML IJ SOLN
INTRAMUSCULAR | Status: AC
  Filled 2021-08-11: qty 1

## 2021-08-11 MED ORDER — PROPOFOL 500 MG/50ML IV EMUL
INTRAVENOUS | Status: DC | PRN
  Administered 2021-08-11: 100 ug/kg/min via INTRAVENOUS

## 2021-08-11 MED ORDER — ONDANSETRON HCL 4 MG/2ML IJ SOLN
INTRAMUSCULAR | Status: AC
  Filled 2021-08-11: qty 2

## 2021-08-11 MED ORDER — ACETAMINOPHEN 10 MG/ML IV SOLN
INTRAVENOUS | Status: DC | PRN
Start: 2021-08-11 — End: 2021-08-11
  Administered 2021-08-11: 1000 mg via INTRAVENOUS

## 2021-08-11 MED ORDER — ONDANSETRON HCL 4 MG/2ML IJ SOLN: 4.0000 mg | Freq: Once | INTRAMUSCULAR | Status: DC | PRN

## 2021-08-11 MED ORDER — PROPOFOL 10 MG/ML IV BOLUS
INTRAVENOUS | Status: AC
  Filled 2021-08-11: qty 40

## 2021-08-11 MED ORDER — CHLORHEXIDINE GLUCONATE 0.12 % MT SOLN: 15.0000 mL | Freq: Once | OROMUCOSAL | Status: AC

## 2021-08-11 MED ORDER — DEXAMETHASONE SODIUM PHOSPHATE 10 MG/ML IJ SOLN
INTRAMUSCULAR | Status: DC | PRN
  Administered 2021-08-11: 4 mg via INTRAVENOUS

## 2021-08-11 MED ORDER — IOHEXOL 180 MG/ML  SOLN
INTRAMUSCULAR | Status: DC | PRN
  Administered 2021-08-11: 20 mL via ORAL

## 2021-08-11 MED ORDER — PROPOFOL 500 MG/50ML IV EMUL
INTRAVENOUS | Status: AC
  Filled 2021-08-11: qty 50

## 2021-08-11 MED ORDER — CHLORHEXIDINE GLUCONATE 0.12 % MT SOLN
OROMUCOSAL | Status: AC
  Administered 2021-08-11: 15 mL via OROMUCOSAL
  Filled 2021-08-11: qty 15

## 2021-08-11 MED ORDER — CEFAZOLIN SODIUM-DEXTROSE 2-4 GM/100ML-% IV SOLN: 2.0000 g | INTRAVENOUS | Status: DC

## 2021-08-11 MED ORDER — SUCCINYLCHOLINE CHLORIDE 200 MG/10ML IV SOSY
PREFILLED_SYRINGE | INTRAVENOUS | Status: AC
  Filled 2021-08-11: qty 10

## 2021-08-11 MED ORDER — ORAL CARE MOUTH RINSE
15.0000 mL | Freq: Once | OROMUCOSAL | Status: AC
Start: 2021-08-11 — End: 2021-08-11

## 2021-08-11 MED ORDER — PROPOFOL 10 MG/ML IV BOLUS
INTRAVENOUS | Status: DC | PRN
  Administered 2021-08-11: 150 mg via INTRAVENOUS

## 2021-08-11 MED ORDER — FENTANYL CITRATE (PF) 100 MCG/2ML IJ SOLN: 25.0000 ug | INTRAMUSCULAR | Status: DC | PRN

## 2021-08-11 MED ORDER — ACETAMINOPHEN 10 MG/ML IV SOLN
INTRAVENOUS | Status: AC
  Filled 2021-08-11: qty 100

## 2021-08-11 MED ORDER — APREPITANT 40 MG PO CAPS: 40.0000 mg | ORAL_CAPSULE | Freq: Once | ORAL | Status: AC

## 2021-08-11 MED ORDER — SUGAMMADEX SODIUM 200 MG/2ML IV SOLN
INTRAVENOUS | Status: DC | PRN
Start: 2021-08-11 — End: 2021-08-11
  Administered 2021-08-11: 161.8 mg via INTRAVENOUS

## 2021-08-11 MED ORDER — LIDOCAINE HCL (CARDIAC) PF 100 MG/5ML IV SOSY
PREFILLED_SYRINGE | INTRAVENOUS | Status: DC | PRN
  Administered 2021-08-11: 100 mg via INTRAVENOUS

## 2021-08-11 MED ORDER — CEPHALEXIN 500 MG PO CAPS: 500.0000 mg | ORAL_CAPSULE | Freq: Every day | ORAL | 0 refills | Status: DC

## 2021-08-11 MED ORDER — LIDOCAINE HCL (PF) 2 % IJ SOLN
INTRAMUSCULAR | Status: AC
  Filled 2021-08-11: qty 5

## 2021-08-11 SURGICAL SUPPLY — 36 items
ADH LQ OCL WTPRF AMP STRL LF (MISCELLANEOUS)
ADHESIVE MASTISOL STRL (MISCELLANEOUS) IMPLANT
BAG DRAIN CYSTO-URO LG1000N (MISCELLANEOUS) ×2 IMPLANT
BASKET ZERO TIP 1.9FR (BASKET) ×1 IMPLANT
BRUSH SCRUB EZ 1% IODOPHOR (MISCELLANEOUS) ×2 IMPLANT
BSKT STON RTRVL ZERO TP 1.9FR (BASKET) ×1
CATH URET FLEX-TIP 2 LUMEN 10F (CATHETERS) IMPLANT
CATH URETL OPEN 5X70 (CATHETERS) IMPLANT
CNTNR SPEC 2.5X3XGRAD LEK (MISCELLANEOUS)
CONT SPEC 4OZ STER OR WHT (MISCELLANEOUS)
CONT SPEC 4OZ STRL OR WHT (MISCELLANEOUS)
CONTAINER SPEC 2.5X3XGRAD LEK (MISCELLANEOUS) IMPLANT
DRAPE UTILITY 15X26 TOWEL STRL (DRAPES) ×2 IMPLANT
DRSG TEGADERM 2-3/8X2-3/4 SM (GAUZE/BANDAGES/DRESSINGS) IMPLANT
GAUZE 4X4 16PLY ~~LOC~~+RFID DBL (SPONGE) ×4 IMPLANT
GLOVE SURG UNDER POLY LF SZ7.5 (GLOVE) ×2 IMPLANT
GOWN STRL REUS W/ TWL LRG LVL3 (GOWN DISPOSABLE) ×1 IMPLANT
GOWN STRL REUS W/ TWL XL LVL3 (GOWN DISPOSABLE) ×1 IMPLANT
GOWN STRL REUS W/TWL LRG LVL3 (GOWN DISPOSABLE) ×2
GOWN STRL REUS W/TWL XL LVL3 (GOWN DISPOSABLE) ×2
GUIDEWIRE STR DUAL SENSOR (WIRE) ×2 IMPLANT
INFUSOR MANOMETER BAG 3000ML (MISCELLANEOUS) ×2 IMPLANT
IV NS IRRIG 3000ML ARTHROMATIC (IV SOLUTION) ×2 IMPLANT
KIT TURNOVER CYSTO (KITS) ×2 IMPLANT
PACK CYSTO AR (MISCELLANEOUS) ×2 IMPLANT
SET CYSTO W/LG BORE CLAMP LF (SET/KITS/TRAYS/PACK) ×2 IMPLANT
SHEATH URETERAL 12FRX35CM (MISCELLANEOUS) IMPLANT
STENT URET 6FRX22 CONTOUR (STENTS) ×1 IMPLANT
STENT URET 6FRX24 CONTOUR (STENTS) IMPLANT
STENT URET 6FRX26 CONTOUR (STENTS) IMPLANT
SURGILUBE 2OZ TUBE FLIPTOP (MISCELLANEOUS) ×2 IMPLANT
SYR 10ML LL (SYRINGE) ×2 IMPLANT
TRACTIP FLEXIVA PULSE ID 200 (Laser) IMPLANT
VALVE UROSEAL ADJ ENDO (VALVE) IMPLANT
WATER STERILE IRR 1000ML POUR (IV SOLUTION) ×2 IMPLANT
WATER STERILE IRR 500ML POUR (IV SOLUTION) ×2 IMPLANT

## 2021-08-11 NOTE — Discharge Instructions (Signed)

## 2021-08-11 NOTE — Anesthesia Preprocedure Evaluation (Signed)
Anesthesia Evaluation  ?Patient identified by MRN, date of birth, ID band ?Patient awake ? ? ? ?Reviewed: ?Allergy & Precautions, H&P , NPO status , Patient's Chart, lab work & pertinent test results, reviewed documented beta blocker date and time  ? ?History of Anesthesia Complications ?(+) PONV and history of anesthetic complications ? ?Airway ?Mallampati: III ? ?TM Distance: >3 FB ?Neck ROM: full ? ? ? Dental ? ?(+) Dental Advidsory Given, Missing, Poor Dentition ?  ?Pulmonary ?shortness of breath and with exertion, asthma , sleep apnea , COPD,  COPD inhaler, neg recent URI, former smoker,  ?  ?Pulmonary exam normal ?breath sounds clear to auscultation ? ? ? ? ? ? Cardiovascular ?Exercise Tolerance: Good ?hypertension, (-) angina+ Peripheral Vascular Disease and + DOE  ?(-) Past MI and (-) Cardiac Stents Normal cardiovascular exam(-) dysrhythmias (-) Valvular Problems/Murmurs ?Rhythm:regular Rate:Normal ? ? ?  ?Neuro/Psych ?neg Seizures PSYCHIATRIC DISORDERS Anxiety Depression  Neuromuscular disease (neuropathy)   ? GI/Hepatic ?Neg liver ROS, GERD  ,  ?Endo/Other  ?diabetes, Type 1, Insulin DependentHypothyroidism  ? Renal/GU ?Renal disease (kidney stones)  ?negative genitourinary ?  ?Musculoskeletal ? ? Abdominal ?  ?Peds ? Hematology ?negative hematology ROS ?(+)   ?Anesthesia Other Findings ?Past Medical History: ?No date: Anxiety ?No date: COPD (chronic obstructive pulmonary disease) ([place]) ?No date: Depression ?No date: DOE (dyspnea on exertion) ?No date: Gastroparesis ?No date: GERD (gastroesophageal reflux disease) ?No date: Glaucoma, both eyes ?06/20/2020: History of 2019 novel coronavirus disease (COVID-19) ?    Comment:  hospital admission-- dx severe covid with pneumonia,  ?             ARDS, DKA, COPD exaberation;  pt intubated 01-13th and  ?             extubated 01-20th;   (11-15-2020  per pt did not go home  ?             on oxygen, residual generalized weakness and  hair loss) ?No date: History of esophageal stricture ?    Comment:  post dilatation, followed by dr Henrene Pastor ?No date: History of Graves' disease ?    Comment:  s/p RAI  03-03-2010 ?No date: History of Helicobacter pylori infection ?    Comment:  remote hx and treatment ?No date: History of kidney stones ?No date: Hyperlipidemia ?No date: Hypertension ?    Comment:  followed by pcp ?02/2010: Hypothyroidism, postradioiodine therapy ?    Comment:  followed by pcp ?No date: IDA (iron deficiency anemia) ?No date: Insulin pump in place ?    Comment:  w/ Novolog , managed by pcp ?No date: Moderate asthma ?    Comment:  followed by pcp,  (11-15-2020  per pt has pulmonology  ?             appt on 11-17-2020)) ?No date: OSA (obstructive sleep apnea) ?    Comment:  11-15-2020  per pt has not used cpap since 05/ 2021   ?             (previous followed by dr dohmeier,  study in epic  ?             05-12-2018 moderate complex osa pt had used for awhile) ?No date: Osteoporosis ?No date: PAD (peripheral artery disease) ([place]) ?    Comment:  followed by dr berry----  s/p bilateral common iliac  ?             artery angioplasty stenting for  stenosis ?No date: Peripheral neuropathy ?No date: PONV (postoperative nausea and vomiting) ?No date: Right wrist fracture ?No date: Type 1 diabetes mellitus with long-term current use of  ?insulin ([place]) ?    Comment:  per pt dx at age 15 approx,  followed by pcp,   ?             (11-15-2020  per pt last A1c 7.2 in 04/ 2022),  checks  ?             blood sugar 6 to 10 times daily , fasting sugar-- 200 --  ?             300) ?No date: Vitamin D deficiency ? ? Reproductive/Obstetrics ?negative OB ROS ? ?  ? ? ? ? ? ? ? ? ? ? ? ? ? ?  ?  ? ? ? ? ? ? ? ? ?Anesthesia Physical ? ?Anesthesia Plan ? ?ASA: 3 and emergent ? ?Anesthesia Plan: General  ? ?Post-op Pain Management:   ? ?Induction: Intravenous, Rapid sequence and Cricoid pressure planned ? ?PONV Risk Score and Plan: 4 or greater and Propofol  infusion, TIVA, Ondansetron, Treatment may vary due to age or medical condition and Aprepitant ? ?Airway Management Planned: Oral ETT ? ?Additional Equipment:  ? ?Intra-op Plan:  ? ?Post-operative Plan: Extubation in OR ? ?Informed Consent: I have reviewed the patients History and Physical, chart, labs and discussed the procedure including the risks, benefits and alternatives for the proposed anesthesia with the patient or authorized representative who has indicated his/her understanding and acceptance.  ? ? ? ?Dental Advisory Given ? ?Plan Discussed with: Anesthesiologist, CRNA and Surgeon ? ?Anesthesia Plan Comments:   ? ? ? ? ? ? ?Anesthesia Quick Evaluation ? ?

## 2021-08-11 NOTE — Op Note (Signed)
Date of procedure: 08/11/21 ? ?Preoperative diagnosis:  ?Right distal ureteral stone ?Right renal stone ? ?Postoperative diagnosis:  ?Same ? ?Procedure: ?Cystoscopy, right retrograde pyelogram with intraoperative interpretation, right ureteral stent placement ?Right ureteroscopy, laser lithotripsy of distal ureteral stones, basket extraction of ureteral stones ?Right ureteroscopy, laser lithotripsy of renal stone ? ?Surgeon: Nickolas Madrid, MD ? ?Anesthesia: General ? ?Complications: None ? ?Intraoperative findings:  ?Uncomplicated dusting of right distal ureteral stone and right renal stone ?Stent placed with Dangler ? ?EBL: Minimal ? ?Specimens: None ? ?Drains: Right 6 French by 22 cm ureteral stent with Dangler ? ?Indication: Ashley [name] is a 56 y.o. patient with 8 mm right distal ureteral stone and 4 mm right renal stone who previously presented with sepsis from urinary source and underwent emergent stent placement.  She presents today for definitive management.  After reviewing the management options for treatment, they elected to proceed with the above surgical procedure(s). We have discussed the potential benefits and risks of the procedure, side effects of the proposed treatment, the likelihood of the patient achieving the goals of the procedure, and any potential problems that might occur during the procedure or recuperation. Informed consent has been obtained. ? ?Description of procedure: ? ?The patient was taken to the operating room and general anesthesia was induced. SCDs were placed for DVT prophylaxis. The patient was placed in the dorsal lithotomy position, prepped and draped in the usual sterile fashion, and preoperative antibiotics(Ancef) were administered. A preoperative time-out was performed.  ? ?A 21 French rigid cystoscope was used to intubate the urethra and thorough cystoscopy was performed.  The bladder was grossly normal, ureteral orifices orthotopic bilaterally.  The right ureteral  stent was grasped and pulled to the meatus, and a sensor was passed through the stent up to the kidney under fluoroscopic vision, and the old stent removed. ? ?The semirigid ureteroscope advanced easily alongside the wire and I immediately identified a 3 mm right distal ureteral stone as well as an 8 mm right distal ureteral stone.  Using a 200 ?m laser fiber and settings of 0.5 J and 60 Hz these were methodically dusted.  The larger fragments remaining were basket extracted.  There is no evidence of ureteral injury. ? ?The single channel digital flexible ureteroscope then advanced easily over the wire up to the kidney under fluoroscopy.  Thorough pyeloscopy revealed a 4 mm stone in the midpole, and this was fragmented to dust on previously mentioned settings. ? ?Retrograde pyelogram was performed from the proximal ureter and showed no extravasation or filling defects.  Careful pullback ureteroscopy showed no injury or residual stone fragments. ? ?The cystoscope was reinserted into the bladder, and a sensor wire passed up to the kidney under fluoroscopic vision.  A 6 French by 22 cm ureteral stent was uneventfully placed with an excellent curl in the upper pole, as well as under direct vision of the bladder.  The bladder was drained, and the Dangler was secured to the suprapubic area with Mastisol and Tegaderm. ? ?Disposition: Stable to PACU ? ?Plan: ?Remove stent at home on Tuesday morning, Keflex prophylaxis with stent in place ?Follow-up in clinic in 6 months with KUB prior ? ?Nickolas Madrid, MD ? ?

## 2021-08-11 NOTE — Anesthesia Procedure Notes (Signed)
Procedure Name: Intubation ?Date/Time: 08/11/2021 11:18 AM ?Performed by: Demetrius Charity, CRNA ?Pre-anesthesia Checklist: Patient identified, Patient being monitored, Timeout performed, Emergency Drugs available and Suction available ?Patient Re-evaluated:Patient Re-evaluated prior to induction ?Oxygen Delivery Method: Circle system utilized ?Preoxygenation: Pre-oxygenation with 100% oxygen ?Induction Type: IV induction ?Ventilation: Mask ventilation without difficulty ?Laryngoscope Size: 3 and McGraph ?Grade View: Grade I ?Tube type: Oral ?Tube size: 6.5 mm ?Number of attempts: 1 ?Airway Equipment and Method: Stylet ?Placement Confirmation: ETT inserted through vocal cords under direct vision, positive ETCO2 and breath sounds checked- equal and bilateral ?Secured at: 21 cm ?Tube secured with: Tape ?Dental Injury: Teeth and Oropharynx as per pre-operative assessment  ? ? ? ? ?

## 2021-08-11 NOTE — H&P (Signed)
? ?08/11/21 ?10:47 AM  ? ?Ashley [name] ?03-Dec-1965 ?[id number] ? ?CC: Right ureteral stone ? ?HPI: ?56 year old female who previously presented with an 8 mm right distal ureteral stone and sepsis from urinary source and underwent emergent stent placement.  Here today for definitive management with ureteroscopy.  She has tolerated the stent well.  Denies any fevers, chills, or urinary symptoms. ? ? ?PMH: ?Past Medical History:  ?Diagnosis Date  ? Anxiety   ? COPD (chronic obstructive pulmonary disease) ([place])   ? Depression   ? DOE (dyspnea on exertion)   ? Dyspnea   ? Gastroparesis   ? GERD (gastroesophageal reflux disease)   ? Glaucoma, both eyes   ? History of 2019 novel coronavirus disease (COVID-19) 06/20/2020  ? hospital admission-- dx severe covid with pneumonia, ARDS, DKA, COPD exaberation;  pt intubated 01-13th and extubated 01-20th;   (11-15-2020  per pt did not go home on oxygen, residual generalized weakness and hair loss)  ? History of esophageal stricture   ? post dilatation, followed by dr Henrene Pastor  ? History of Graves' disease   ? s/p RAI  03-03-2010  ? History of Helicobacter pylori infection   ? remote hx and treatment  ? History of kidney stones   ? Hyperlipidemia   ? Hypertension   ? followed by pcp  ? Hypothyroidism, postradioiodine therapy 02/2010  ? followed by pcp  ? IDA (iron deficiency anemia)   ? Insulin pump in place   ? w/ Novolog , managed by pcp  ? Moderate asthma   ? followed by pcp,  (11-15-2020  per pt has pulmonology appt on 11-17-2020))  ? OSA (obstructive sleep apnea)   ? 11-15-2020  per pt has not used cpap since 05/ 2021  (previous followed by dr dohmeier,  study in epic 05-12-2018 moderate complex osa pt had used for awhile)  ? Osteoporosis   ? PAD (peripheral artery disease) ([place])   ? followed by dr berry----  s/p bilateral common iliac artery angioplasty stenting for stenosis  ? Peripheral neuropathy   ? Pneumonia   ? covid 2022  ? PONV (postoperative nausea and vomiting)   ?  Rheumatoid arthritis ([place])   ? in both eyes  ? Right wrist fracture   ? Type 1 diabetes mellitus with long-term current use of insulin ([place])   ? per pt dx at age 51 approx,  followed by pcp,  (11-15-2020  per pt last A1c 7.2 in 04/ 2022),  checks blood sugar 6 to 10 times daily , fasting sugar-- 200 -- 300)  ? Vitamin D deficiency   ? ? ?Surgical History: ?Past Surgical History:  ?Procedure Laterality Date  ? ABDOMINAL AORTOGRAM W/LOWER EXTREMITY Bilateral 01/01/2019  ? Procedure: ABDOMINAL AORTOGRAM W/LOWER EXTREMITY;  Surgeon: Lorretta Harp, MD;  Location: [facility];  Service: Cardiovascular;  Laterality: Bilateral;  ? BREAST EXCISIONAL BIOPSY Left 04-*02-2000  @[facility]   ? per pt benign  ? BREAST SURGERY    ? CATARACT EXTRACTION W/ INTRAOCULAR LENS  IMPLANT, BILATERAL  11/2019  ? COLONOSCOPY  last one 2019 approx  ? CYSTOSCOPY WITH STENT PLACEMENT Right 07/22/2021  ? Procedure: CYSTOSCOPY WITH STENT PLACEMENT;  Surgeon: Billey Co, MD;  Location: [facility];  Service: Urology;  Laterality: Right;  ? FRACTURE SURGERY    ? LAPAROSCOPIC ASSISTED VAGINAL HYSTERECTOMY  06-22-2004 @[facility]   ? NASAL SEPTOPLASTY W/ TURBINOPLASTY Bilateral 09/07/2013  ? Procedure: SEPTOPLASTY, BILATERAL TURBINATE RESECTION ;  Surgeon: Ascencion Dike, MD;  Location: [facility];  Service: ENT;  Laterality: Bilateral;  ? OPEN REDUCTION INTERNAL FIXATION (ORIF) DISTAL RADIAL FRACTURE Right 11/16/2020  ? Procedure: OPEN REDUCTION INTERNAL FIXATION (ORIF) DISTAL RADIAL FRACTURE;  Surgeon: Hiram Gash, MD;  Location: [facility];  Service: Orthopedics;  Laterality: Right;  ? PERIPHERAL VASCULAR INTERVENTION Bilateral 01/01/2019  ? Procedure: PERIPHERAL VASCULAR INTERVENTION;  Surgeon: Lorretta Harp, MD;  Location: [facility];  Service: Cardiovascular;  Laterality: Bilateral;  ? UPPER GASTROINTESTINAL ENDOSCOPY  last one 07-23-2019  dr Henrene Pastor  ? ? ? ?Family History: ?Family History  ?Problem Relation  Age of Onset  ? Diabetes Father   ? Heart attack Father   ? Alcoholism Father   ? Lung cancer Mother   ? Liver cancer Mother   ? Irritable bowel syndrome Sister   ? Colon cancer Neg Hx   ? Esophageal cancer Neg Hx   ? Rectal cancer Neg Hx   ? Stomach cancer Neg Hx   ? ? ?Social History:  reports that she quit smoking about 13 months ago. Her smoking use included cigarettes. She started smoking about 41 years ago. She has a 12.50 pack-year smoking history. She has never used smokeless tobacco. She reports that she does not drink alcohol and does not use drugs. ? ?Physical Exam: ?BP (!) 161/76   Pulse 88   Temp 98.8 ?F (37.1 ?C) (Temporal)   Resp 16   Wt 80.9 kg   SpO2 97%   BMI 36.02 kg/m?   ? ?Constitutional:  Alert and oriented, No acute distress. ?Cardiovascular: Regular rate and rhythm ?Respiratory: Clear to auscultation bilaterally ?GI: Abdomen is soft, nontender, nondistended, no abdominal masses ? ?Laboratory Data: ?Urine culture 07/22/2021 with E. coli, treated with culture appropriate antibiotics ? ?Assessment & Plan:   ?56 year old female who previously presented with 8 mm right distal ureteral stone and sepsis from urinary source and underwent emergent stent placement.  Here today for definitive management with right ureteroscopy, laser lithotripsy, stent placement. ? ?We specifically discussed the risks ureteroscopy including bleeding, infection/sepsis, stent related symptoms including flank pain/urgency/frequency/incontinence/dysuria, ureteral injury, inability to access stone, or need for staged or additional procedures. ? ?Right ureteroscopy, laser lithotripsy, stent placement ? ?Nickolas Madrid, MD ?08/11/2021 ? ?[facility] ?[address], [address] ?[place], [place] [address] ?[phone] ? ? ?

## 2021-08-11 NOTE — Transfer of Care (Signed)
Immediate Anesthesia Transfer of Care Note ? ?Patient: Ashley [name] ? ?Procedure(s) Performed: CYSTOSCOPY/URETEROSCOPY/HOLMIUM LASER/STENT EXCHANGE (Right) ? ?Patient Location: PACU ? ?Anesthesia Type:General ? ?Level of Consciousness: drowsy ? ?Airway & Oxygen Therapy: Patient Spontanous Breathing and Patient connected to face mask oxygen ? ?Post-op Assessment: Report given to RN and Post -op Vital signs reviewed and stable ? ?Post vital signs: Reviewed and stable ? ?Last Vitals:  ?Vitals Value Taken Time  ?BP 128/67 08/11/21 1204  ?Temp    ?Pulse 78 08/11/21 1208  ?Resp 13 08/11/21 1208  ?SpO2 98 % 08/11/21 1208  ?Vitals shown include unvalidated device data. ? ?Last Pain:  ?Vitals:  ? 08/11/21 0909  ?TempSrc: Temporal  ?PainSc: 0-No pain  ?   ? ?  ? ?Complications: No notable events documented. ?

## 2021-08-11 NOTE — Progress Notes (Signed)
Pt CBG 345. Pt has her own insulin pump. I notified Dr. Rosey Bath and pt gave herself a 4 unit bolus through insulin pump.  ?

## 2021-08-12 NOTE — Anesthesia Postprocedure Evaluation (Signed)
Anesthesia Post Note ? ?Patient: Ashley [name] ? ?Procedure(s) Performed: CYSTOSCOPY/URETEROSCOPY/HOLMIUM LASER/STENT EXCHANGE (Right) ? ?Patient location during evaluation: PACU ?Anesthesia Type: General ?Level of consciousness: awake and alert ?Pain management: pain level controlled ?Vital Signs Assessment: post-procedure vital signs reviewed and stable ?Respiratory status: spontaneous breathing, nonlabored ventilation, respiratory function stable and patient connected to nasal cannula oxygen ?Cardiovascular status: blood pressure returned to baseline and stable ?Postop Assessment: no apparent nausea or vomiting ?Anesthetic complications: no ? ? ?No notable events documented. ? ? ?Last Vitals:  ?Vitals:  ? 08/11/21 1330 08/11/21 1357  ?BP: 101/62 (!) 124/59  ?Pulse: 69 80  ?Resp: 11 16  ?Temp: 36.4 ?C (!) 36.1 ?C  ?SpO2: 96% 98%  ?  ?Last Pain:  ?Vitals:  ? 08/11/21 1357  ?TempSrc: Temporal  ?PainSc: 1   ? ? ?  ?  ?  ?  ?  ?  ? ?Martha Clan ? ? ? ? ?

## 2021-08-13 ENCOUNTER — Emergency Department: Payer: Medicare HMO

## 2021-08-13 ENCOUNTER — Inpatient Hospital Stay
Admission: EM | Admit: 2021-08-13 | Discharge: 2021-08-16 | DRG: 854 | Disposition: A | Discharge status: Home / Self Care | Payer: Medicare HMO | Attending: Internal Medicine | Admitting: Internal Medicine

## 2021-08-13 ENCOUNTER — Encounter: Payer: Self-pay | Admitting: Urology

## 2021-08-13 ENCOUNTER — Other Ambulatory Visit: Payer: Self-pay

## 2021-08-13 DIAGNOSIS — Z8619 Personal history of other infectious and parasitic diseases: Secondary | ICD-10-CM

## 2021-08-13 DIAGNOSIS — Z87442 Personal history of urinary calculi: Secondary | ICD-10-CM

## 2021-08-13 DIAGNOSIS — Z8701 Personal history of pneumonia (recurrent): Secondary | ICD-10-CM

## 2021-08-13 DIAGNOSIS — Z882 Allergy status to sulfonamides status: Secondary | ICD-10-CM

## 2021-08-13 DIAGNOSIS — Z794 Long term (current) use of insulin: Secondary | ICD-10-CM

## 2021-08-13 DIAGNOSIS — Z6833 Body mass index (BMI) 33.0-33.9, adult: Secondary | ICD-10-CM

## 2021-08-13 DIAGNOSIS — E039 Hypothyroidism, unspecified: Secondary | ICD-10-CM | POA: Diagnosis present

## 2021-08-13 DIAGNOSIS — Z7902 Long term (current) use of antithrombotics/antiplatelets: Secondary | ICD-10-CM

## 2021-08-13 DIAGNOSIS — Z79899 Other long term (current) drug therapy: Secondary | ICD-10-CM

## 2021-08-13 DIAGNOSIS — Z881 Allergy status to other antibiotic agents status: Secondary | ICD-10-CM

## 2021-08-13 DIAGNOSIS — J449 Chronic obstructive pulmonary disease, unspecified: Secondary | ICD-10-CM | POA: Diagnosis present

## 2021-08-13 DIAGNOSIS — K760 Fatty (change of) liver, not elsewhere classified: Secondary | ICD-10-CM | POA: Diagnosis present

## 2021-08-13 DIAGNOSIS — Z87891 Personal history of nicotine dependence: Secondary | ICD-10-CM

## 2021-08-13 DIAGNOSIS — I10 Essential (primary) hypertension: Secondary | ICD-10-CM | POA: Diagnosis present

## 2021-08-13 DIAGNOSIS — Z87440 Personal history of urinary (tract) infections: Secondary | ICD-10-CM

## 2021-08-13 DIAGNOSIS — R000 Tachycardia, unspecified: Secondary | ICD-10-CM | POA: Diagnosis not present

## 2021-08-13 DIAGNOSIS — H409 Unspecified glaucoma: Secondary | ICD-10-CM | POA: Diagnosis present

## 2021-08-13 DIAGNOSIS — Z79890 Hormone replacement therapy (postmenopausal): Secondary | ICD-10-CM

## 2021-08-13 DIAGNOSIS — E669 Obesity, unspecified: Secondary | ICD-10-CM | POA: Diagnosis present

## 2021-08-13 DIAGNOSIS — B9620 Unspecified Escherichia coli [E. coli] as the cause of diseases classified elsewhere: Secondary | ICD-10-CM | POA: Diagnosis present

## 2021-08-13 DIAGNOSIS — G4733 Obstructive sleep apnea (adult) (pediatric): Secondary | ICD-10-CM | POA: Diagnosis present

## 2021-08-13 DIAGNOSIS — Z7951 Long term (current) use of inhaled steroids: Secondary | ICD-10-CM

## 2021-08-13 DIAGNOSIS — M810 Age-related osteoporosis without current pathological fracture: Secondary | ICD-10-CM | POA: Diagnosis present

## 2021-08-13 DIAGNOSIS — Z7982 Long term (current) use of aspirin: Secondary | ICD-10-CM

## 2021-08-13 DIAGNOSIS — M069 Rheumatoid arthritis, unspecified: Secondary | ICD-10-CM | POA: Diagnosis present

## 2021-08-13 DIAGNOSIS — R0902 Hypoxemia: Secondary | ICD-10-CM | POA: Diagnosis not present

## 2021-08-13 DIAGNOSIS — Z8616 Personal history of COVID-19: Secondary | ICD-10-CM

## 2021-08-13 DIAGNOSIS — Z20822 Contact with and (suspected) exposure to covid-19: Secondary | ICD-10-CM | POA: Diagnosis present

## 2021-08-13 DIAGNOSIS — N390 Urinary tract infection, site not specified: Secondary | ICD-10-CM | POA: Diagnosis not present

## 2021-08-13 DIAGNOSIS — Z833 Family history of diabetes mellitus: Secondary | ICD-10-CM

## 2021-08-13 DIAGNOSIS — E860 Dehydration: Secondary | ICD-10-CM | POA: Diagnosis present

## 2021-08-13 DIAGNOSIS — R509 Fever, unspecified: Secondary | ICD-10-CM | POA: Diagnosis not present

## 2021-08-13 DIAGNOSIS — E876 Hypokalemia: Secondary | ICD-10-CM | POA: Diagnosis present

## 2021-08-13 DIAGNOSIS — Z90710 Acquired absence of both cervix and uterus: Secondary | ICD-10-CM

## 2021-08-13 DIAGNOSIS — N136 Pyonephrosis: Secondary | ICD-10-CM | POA: Diagnosis present

## 2021-08-13 DIAGNOSIS — E1042 Type 1 diabetes mellitus with diabetic polyneuropathy: Secondary | ICD-10-CM | POA: Diagnosis present

## 2021-08-13 DIAGNOSIS — E1051 Type 1 diabetes mellitus with diabetic peripheral angiopathy without gangrene: Secondary | ICD-10-CM | POA: Diagnosis present

## 2021-08-13 DIAGNOSIS — R197 Diarrhea, unspecified: Secondary | ICD-10-CM | POA: Diagnosis present

## 2021-08-13 DIAGNOSIS — R0689 Other abnormalities of breathing: Secondary | ICD-10-CM | POA: Diagnosis not present

## 2021-08-13 DIAGNOSIS — E559 Vitamin D deficiency, unspecified: Secondary | ICD-10-CM | POA: Diagnosis present

## 2021-08-13 DIAGNOSIS — K3184 Gastroparesis: Secondary | ICD-10-CM | POA: Diagnosis present

## 2021-08-13 DIAGNOSIS — Z888 Allergy status to other drugs, medicaments and biological substances status: Secondary | ICD-10-CM

## 2021-08-13 DIAGNOSIS — A419 Sepsis, unspecified organism: Principal | ICD-10-CM | POA: Diagnosis present

## 2021-08-13 DIAGNOSIS — I739 Peripheral vascular disease, unspecified: Secondary | ICD-10-CM | POA: Diagnosis present

## 2021-08-13 DIAGNOSIS — Z885 Allergy status to narcotic agent status: Secondary | ICD-10-CM

## 2021-08-13 DIAGNOSIS — Z9641 Presence of insulin pump (external) (internal): Secondary | ICD-10-CM | POA: Diagnosis present

## 2021-08-13 DIAGNOSIS — F419 Anxiety disorder, unspecified: Secondary | ICD-10-CM | POA: Diagnosis present

## 2021-08-13 DIAGNOSIS — Z8249 Family history of ischemic heart disease and other diseases of the circulatory system: Secondary | ICD-10-CM

## 2021-08-13 DIAGNOSIS — Z9842 Cataract extraction status, left eye: Secondary | ICD-10-CM

## 2021-08-13 DIAGNOSIS — Z9841 Cataract extraction status, right eye: Secondary | ICD-10-CM

## 2021-08-13 DIAGNOSIS — E785 Hyperlipidemia, unspecified: Secondary | ICD-10-CM | POA: Diagnosis present

## 2021-08-13 DIAGNOSIS — E1165 Type 2 diabetes mellitus with hyperglycemia: Secondary | ICD-10-CM

## 2021-08-13 DIAGNOSIS — E1043 Type 1 diabetes mellitus with diabetic autonomic (poly)neuropathy: Secondary | ICD-10-CM | POA: Diagnosis present

## 2021-08-13 DIAGNOSIS — N202 Calculus of kidney with calculus of ureter: Secondary | ICD-10-CM | POA: Diagnosis present

## 2021-08-13 DIAGNOSIS — K219 Gastro-esophageal reflux disease without esophagitis: Secondary | ICD-10-CM | POA: Diagnosis present

## 2021-08-13 DIAGNOSIS — Z961 Presence of intraocular lens: Secondary | ICD-10-CM | POA: Diagnosis present

## 2021-08-13 DIAGNOSIS — E1065 Type 1 diabetes mellitus with hyperglycemia: Secondary | ICD-10-CM | POA: Diagnosis present

## 2021-08-13 DIAGNOSIS — F32A Depression, unspecified: Secondary | ICD-10-CM | POA: Diagnosis present

## 2021-08-13 LAB — CBC WITH DIFFERENTIAL/PLATELET
Abs Immature Granulocytes: 0.06 10*3/uL (ref 0.00–0.07)
Basophils Absolute: 0.1 10*3/uL (ref 0.0–0.1)
Basophils Relative: 0 %
Eosinophils Absolute: 0 10*3/uL (ref 0.0–0.5)
Eosinophils Relative: 0 %
HCT: 35.3 % — ABNORMAL LOW (ref 36.0–46.0)
Hemoglobin: 11.3 g/dL — ABNORMAL LOW (ref 12.0–15.0)
Immature Granulocytes: 0 %
Lymphocytes Relative: 7 %
Lymphs Abs: 1 10*3/uL (ref 0.7–4.0)
MCH: 28.8 pg (ref 26.0–34.0)
MCHC: 32 g/dL (ref 30.0–36.0)
MCV: 90.1 fL (ref 80.0–100.0)
Monocytes Absolute: 1.3 10*3/uL — ABNORMAL HIGH (ref 0.1–1.0)
Monocytes Relative: 9 %
Neutro Abs: 11 10*3/uL — ABNORMAL HIGH (ref 1.7–7.7)
Neutrophils Relative %: 84 %
Platelets: 227 10*3/uL (ref 150–400)
RBC: 3.92 MIL/uL (ref 3.87–5.11)
RDW: 14.6 % (ref 11.5–15.5)
WBC: 13.4 10*3/uL — ABNORMAL HIGH (ref 4.0–10.5)
nRBC: 0 % (ref 0.0–0.2)

## 2021-08-13 LAB — COMPREHENSIVE METABOLIC PANEL
ALT: 20 U/L (ref 0–44)
AST: 18 U/L (ref 15–41)
Albumin: 3.4 g/dL — ABNORMAL LOW (ref 3.5–5.0)
Alkaline Phosphatase: 91 U/L (ref 38–126)
Anion gap: 13 (ref 5–15)
BUN: 14 mg/dL (ref 6–20)
CO2: 23 mmol/L (ref 22–32)
Calcium: 8.7 mg/dL — ABNORMAL LOW (ref 8.9–10.3)
Chloride: 98 mmol/L (ref 98–111)
Creatinine, Ser: 0.96 mg/dL (ref 0.44–1.00)
GFR, Estimated: >60 mL/min (ref >60)
Glucose, Bld: 258 mg/dL — ABNORMAL HIGH (ref 70–99)
Potassium: 3.3 mmol/L — ABNORMAL LOW (ref 3.5–5.1)
Sodium: 134 mmol/L — ABNORMAL LOW (ref 135–145)
Total Bilirubin: 0.8 mg/dL (ref 0.3–1.2)
Total Protein: 6.8 g/dL (ref 6.5–8.1)

## 2021-08-13 LAB — URINALYSIS, COMPLETE (UACMP) WITH MICROSCOPIC
Bilirubin Urine: NEGATIVE
Glucose, UA: 150 mg/dL — AB
Ketones, ur: 20 mg/dL — AB
Leukocytes,Ua: NEGATIVE
Nitrite: NEGATIVE
Protein, ur: 100 mg/dL — AB
Specific Gravity, Urine: 1.011 (ref 1.005–1.030)
pH: 7 (ref 5.0–8.0)

## 2021-08-13 LAB — PROTIME-INR
INR: 1.1 (ref 0.8–1.2)
Prothrombin Time: 14.2 seconds (ref 11.4–15.2)

## 2021-08-13 LAB — APTT: aPTT: 21 seconds — ABNORMAL LOW (ref 24–36)

## 2021-08-13 LAB — LACTIC ACID, PLASMA: Lactic Acid, Venous: 1.6 mmol/L (ref 0.5–1.9)

## 2021-08-13 LAB — RESP PANEL BY RT-PCR (FLU A&B, COVID) ARPGX2
Influenza A by PCR: NEGATIVE
Influenza B by PCR: NEGATIVE
SARS Coronavirus 2 by RT PCR: NEGATIVE

## 2021-08-13 MED ORDER — METRONIDAZOLE 500 MG/100ML IV SOLN
500.0000 mg | Freq: Once | INTRAVENOUS | Status: AC
  Administered 2021-08-13: 500 mg via INTRAVENOUS
  Filled 2021-08-13 (×2): qty 100

## 2021-08-13 MED ORDER — VANCOMYCIN HCL IN DEXTROSE 1-5 GM/200ML-% IV SOLN
1000.0000 mg | Freq: Once | INTRAVENOUS | Status: DC
  Filled 2021-08-13: qty 200

## 2021-08-13 MED ORDER — SODIUM CHLORIDE 0.9 % IV SOLN
2.0000 g | Freq: Once | INTRAVENOUS | Status: AC
  Administered 2021-08-13: 2 g via INTRAVENOUS
  Filled 2021-08-13: qty 2

## 2021-08-13 MED ORDER — LACTATED RINGERS IV SOLN: INTRAVENOUS | Status: DC

## 2021-08-13 MED ORDER — IOHEXOL 300 MG/ML  SOLN
100.0000 mL | Freq: Once | INTRAMUSCULAR | Status: AC | PRN
  Administered 2021-08-13: 100 mL via INTRAVENOUS

## 2021-08-13 MED ORDER — LACTATED RINGERS IV BOLUS (SEPSIS)
2000.0000 mL | Freq: Once | INTRAVENOUS | Status: AC
  Administered 2021-08-13: 2000 mL via INTRAVENOUS

## 2021-08-13 MED ORDER — ACETAMINOPHEN 500 MG PO TABS
1000.0000 mg | ORAL_TABLET | Freq: Once | ORAL | Status: AC
  Administered 2021-08-13: 1000 mg via ORAL
  Filled 2021-08-13: qty 2

## 2021-08-13 MED ORDER — VANCOMYCIN HCL 1750 MG/350ML IV SOLN
1750.0000 mg | Freq: Once | INTRAVENOUS | Status: AC
  Administered 2021-08-13: 1750 mg via INTRAVENOUS
  Filled 2021-08-13: qty 350

## 2021-08-13 NOTE — Progress Notes (Signed)
PHARMACY -  BRIEF ANTIBIOTIC NOTE  ? ?Pharmacy has received consult(s) for Cefepime, Vancomycin from an ED provider.  The patient's profile has been reviewed for ht/wt/allergies/indication/available labs.   ? ?One time order(s) placed for Cefepime 2 gm IV X 1 and Vancomycin 1750 mg IV X 1 .  ? ?Further antibiotics/pharmacy consults should be ordered by admitting physician if indicated.       ?                ?Thank you, ?Nabila Albarracin D ?08/13/2021  10:04 PM ? ?

## 2021-08-13 NOTE — ED Triage Notes (Signed)
Pt arrives [facility], Pt family called d/t concern for sepsis.  Pt had rt kidney stent placed this past Friday.  Pt febrile at 101 w/chills per EMS.  Pt endorses rt abdominal pain and nausea pre zofran.  Pt Aox4 at this time. ?

## 2021-08-13 NOTE — Sepsis Progress Note (Signed)
Elink following for Sepsis Protocol 

## 2021-08-13 NOTE — ED Notes (Signed)
IV team in room at this time.  

## 2021-08-13 NOTE — ED Notes (Signed)
IV team only able to obtain 1 set of cultures. EDP Isaacs notified. ?

## 2021-08-13 NOTE — ED Provider Notes (Signed)
? ?[facility] ?Provider Note ? ? ? None  ?  (approximate) ? ? ?History  ? ?Code Sepsis ? ? ?HPI ? ?Ashley [name] is a 56 y.o. female   with complex past medical history including recent sepsis due to UTI and infected stone, status post ureteral stent placement, with replacement on 3/3, here with chills, fever, nausea, and body aches.  The patient actually just had a replacement of ureteral stent with Dr. Caprice Beaver on 3/3.  She states she had been doing fairly well.  Over the last 48 hours, however, she has developed suprapubic pain, dysuria, chills, nausea, and body aches.  She states that she has had high fever over the last day, nausea, weakness, and feels like she did when she was septic with her prior UTI.  Her blood sugars have been labile.  She states she is also had nausea and vomiting.  No diarrhea.  No chest pain.  No shortness of breath.  She has been taking her medications as prescribed. ? ?  ? ? ?Physical Exam  ? ?Triage Vital Signs: ?ED Triage Vitals  ?Enc Vitals Group  ?   BP   ?   Pulse   ?   Resp   ?   Temp   ?   Temp src   ?   SpO2   ?   Weight   ?   Height   ?   Head Circumference   ?   Peak Flow   ?   Pain Score   ?   Pain Loc   ?   Pain Edu?   ?   Excl. in [place]?   ? ? ?Most recent vital signs: ?Vitals:  ? 08/14/21 0011 08/14/21 0103  ?BP: (!) 130/58   ?Pulse: (!) 114   ?Resp: 12   ?Temp:  99.9 ?F (37.7 ?C)  ?SpO2: 97%   ? ? ? ?General: Awake, no distress.  ?CV:  Good peripheral perfusion.  Tachycardic, no murmurs. ?Resp:  Normal effort.  Lungs clear to auscultation bilaterally. ?Abd:  No distention.  Mild suprapubic tenderness.  No overt CVA tenderness. ?Other:  Mildly dry mucous membranes.  Appears ill but nontoxic. ? ? ?ED Results / Procedures / Treatments  ? ?Labs ?(all labs ordered are listed, but only abnormal results are displayed) ?Labs Reviewed  ?COMPREHENSIVE METABOLIC PANEL - Abnormal; Notable for the following components:  ?    Result Value  ? Sodium 134 (*)   ?  Potassium 3.3 (*)   ? Glucose, Bld 258 (*)   ? Calcium 8.7 (*)   ? Albumin 3.4 (*)   ? All other components within normal limits  ?CBC WITH DIFFERENTIAL/PLATELET - Abnormal; Notable for the following components:  ? WBC 13.4 (*)   ? Hemoglobin 11.3 (*)   ? HCT 35.3 (*)   ? Neutro Abs 11.0 (*)   ? Monocytes Absolute 1.3 (*)   ? All other components within normal limits  ?URINALYSIS, COMPLETE (UACMP) WITH MICROSCOPIC - Abnormal; Notable for the following components:  ? Color, Urine YELLOW (*)   ? APPearance CLEAR (*)   ? Glucose, UA 150 (*)   ? Hgb urine dipstick MODERATE (*)   ? Ketones, ur 20 (*)   ? Protein, ur 100 (*)   ? Bacteria, UA RARE (*)   ? All other components within normal limits  ?APTT - Abnormal; Notable for the following components:  ? aPTT 21 (*)   ? All other  components within normal limits  ?RESP PANEL BY RT-PCR (FLU A&B, COVID) [id number]  ?CULTURE, BLOOD (ROUTINE X 2)  ?URINE CULTURE  ?CULTURE, BLOOD (ROUTINE X 2) W REFLEX TO ID PANEL  ?LACTIC ACID, PLASMA  ?PROTIME-INR  ?LACTIC ACID, PLASMA  ?POC URINE PREG, ED  ? ? ? ?EKG ?Sinus tachycardia, ventricular 121.  PR 125, QRS 100, QTc 456.  No acute ST elevations or depressions. ? ? ?RADIOLOGY ?CT abdomen/pelvis: Interval passage of distal right calculus, residual right-sided hydro ?Chest x-ray: Clear ? ? ?I also independently reviewed and agree wit radiologist interpretations. ? ? ?PROCEDURES: ? ?Critical Care performed: Yes, see critical care procedure note(s) ? ?.Critical Care ?Performed by: Duffy Bruce, MD ?Authorized by: Duffy Bruce, MD  ? ?Critical care provider statement:  ?  Critical care time (minutes):  30 ?  Critical care was necessary to treat or prevent imminent or life-threatening deterioration of the following conditions:  Cardiac failure, circulatory failure, respiratory failure and sepsis ?  Critical care was time spent personally by me on the following activities:  Development of treatment plan with patient or surrogate,  discussions with consultants, evaluation of patient's response to treatment, examination of patient, ordering and review of laboratory studies, ordering and review of radiographic studies, ordering and performing treatments and interventions, pulse oximetry, re-evaluation of patient's condition and review of old charts ? ? ? ?MEDICATIONS ORDERED IN ED: ?Medications  ?lactated ringers infusion ( Intravenous New Bag/Given 08/14/21 0130)  ?ceFEPIme (MAXIPIME) 2 g in sodium chloride 0.9 % 100 mL IVPB (0 g Intravenous Stopped 08/13/21 2307)  ?metroNIDAZOLE (FLAGYL) IVPB 500 mg (0 mg Intravenous Stopped 08/14/21 0104)  ?lactated ringers bolus 2,000 mL (0 mLs Intravenous Stopped 08/14/21 0104)  ?vancomycin (VANCOREADY) IVPB 1750 mg/350 mL (0 mg Intravenous Stopped 08/14/21 0130)  ?acetaminophen (TYLENOL) tablet 1,000 mg (1,000 mg Oral Given 08/13/21 2212)  ?iohexol (OMNIPAQUE) 300 MG/ML solution 100 mL (100 mLs Intravenous Contrast Given 08/13/21 2333)  ? ? ? ?IMPRESSION / MDM / ASSESSMENT AND PLAN / ED COURSE  ?I reviewed the triage vital signs and the nursing notes. ?             ?               ? ? ?The patient is on the cardiac monitor to evaluate for evidence of arrhythmia and/or significant heart rate changes. ? ? ?MDM:  ?56 year old female with complex past medical history including recent ureteric stent placement for infected stone here with fever, chills, and suprapubic pain.  Patient pulled out her ureteral stent today.  Patient febrile, tachycardic, with concern for sepsis on arrival.  Sepsis protocol initiated with broad-spectrum antibiotics and IV fluids.  Suspect sepsis due to recurrent UTI, likely with transient bacteremia in setting of pulling her stent.  CT abdomen pelvis obtained, reviewed, shows no evidence of complication.  She does have some mild residual hydro.  There is no evidence of retained stone.  No mention of stricture is noted on her recent cystoscopy note.  Otherwise, CBC shows leukocytosis.  Lactic acid  is normal.  CMP with no evidence of AKI.  Urinalysis with rare bacteria but pyuria and hematuria likely consistent with UTI.  Ketonuria noted consistent with dehydration.  Will admit to medicine for sepsis due to likely UTI ? ?MEDICATIONS GIVEN IN ED: ?Medications  ?lactated ringers infusion ( Intravenous New Bag/Given 08/14/21 0130)  ?ceFEPIme (MAXIPIME) 2 g in sodium chloride 0.9 % 100 mL IVPB (0 g Intravenous Stopped 08/13/21 2307)  ?  metroNIDAZOLE (FLAGYL) IVPB 500 mg (0 mg Intravenous Stopped 08/14/21 0104)  ?lactated ringers bolus 2,000 mL (0 mLs Intravenous Stopped 08/14/21 0104)  ?vancomycin (VANCOREADY) IVPB 1750 mg/350 mL (0 mg Intravenous Stopped 08/14/21 0130)  ?acetaminophen (TYLENOL) tablet 1,000 mg (1,000 mg Oral Given 08/13/21 2212)  ?iohexol (OMNIPAQUE) 300 MG/ML solution 100 mL (100 mLs Intravenous Contrast Given 08/13/21 2333)  ? ? ? ?Consults:  ?None ? ? ?EMR reviewed  ?Op note from Dr. Diamantina Providence 3/3 ?Recent D/C summary from admission 07/2021 ? ? ? ? ?FINAL CLINICAL IMPRESSION(S) / ED DIAGNOSES  ? ?Final diagnoses:  ?Sepsis secondary to UTI [facility])  ? ? ? ?Rx / DC Orders  ? ?ED Discharge Orders   ? ? None  ? ?  ? ? ? ?Note:  This document was prepared using Dragon voice recognition software and may include unintentional dictation errors. ?  ?Duffy Bruce, MD ?08/14/21 0147 ? ?

## 2021-08-13 NOTE — Progress Notes (Signed)
CODE SEPSIS - PHARMACY COMMUNICATION ? ?**Broad Spectrum Antibiotics should be administered within 1 hour of Sepsis diagnosis** ? ?Time Code Sepsis Called/Page Received:  3/5 @ 2148  ? ?Antibiotics Ordered: Cefepime, Vanc  ? ?Time of 1st antibiotic administration: Cefepime 2 gm IV X 1 on 3/5 @ 2208 ? ?Additional action taken by pharmacy:  ? ?If necessary, Name of Provider/Nurse Contacted:  ? ? ? ?Nicklous Aburto D ,PharmD ?Clinical Pharmacist  ?08/13/2021  10:39 PM ? ?

## 2021-08-14 DIAGNOSIS — A419 Sepsis, unspecified organism: Principal | ICD-10-CM

## 2021-08-14 DIAGNOSIS — N134 Hydroureter: Secondary | ICD-10-CM | POA: Diagnosis not present

## 2021-08-14 DIAGNOSIS — E860 Dehydration: Secondary | ICD-10-CM | POA: Diagnosis present

## 2021-08-14 DIAGNOSIS — F32A Depression, unspecified: Secondary | ICD-10-CM | POA: Diagnosis present

## 2021-08-14 DIAGNOSIS — M069 Rheumatoid arthritis, unspecified: Secondary | ICD-10-CM | POA: Diagnosis present

## 2021-08-14 DIAGNOSIS — E876 Hypokalemia: Secondary | ICD-10-CM | POA: Diagnosis present

## 2021-08-14 DIAGNOSIS — N1330 Unspecified hydronephrosis: Secondary | ICD-10-CM | POA: Insufficient documentation

## 2021-08-14 DIAGNOSIS — E039 Hypothyroidism, unspecified: Secondary | ICD-10-CM | POA: Diagnosis not present

## 2021-08-14 DIAGNOSIS — B9620 Unspecified Escherichia coli [E. coli] as the cause of diseases classified elsewhere: Secondary | ICD-10-CM | POA: Diagnosis present

## 2021-08-14 DIAGNOSIS — I10 Essential (primary) hypertension: Secondary | ICD-10-CM | POA: Diagnosis not present

## 2021-08-14 DIAGNOSIS — E669 Obesity, unspecified: Secondary | ICD-10-CM | POA: Diagnosis present

## 2021-08-14 DIAGNOSIS — K3184 Gastroparesis: Secondary | ICD-10-CM | POA: Diagnosis present

## 2021-08-14 DIAGNOSIS — K760 Fatty (change of) liver, not elsewhere classified: Secondary | ICD-10-CM | POA: Diagnosis present

## 2021-08-14 DIAGNOSIS — E785 Hyperlipidemia, unspecified: Secondary | ICD-10-CM | POA: Diagnosis present

## 2021-08-14 DIAGNOSIS — Z20822 Contact with and (suspected) exposure to covid-19: Secondary | ICD-10-CM | POA: Diagnosis present

## 2021-08-14 DIAGNOSIS — G4733 Obstructive sleep apnea (adult) (pediatric): Secondary | ICD-10-CM | POA: Diagnosis present

## 2021-08-14 DIAGNOSIS — E1051 Type 1 diabetes mellitus with diabetic peripheral angiopathy without gangrene: Secondary | ICD-10-CM | POA: Diagnosis present

## 2021-08-14 DIAGNOSIS — T8389XA Other specified complication of genitourinary prosthetic devices, implants and grafts, initial encounter: Secondary | ICD-10-CM | POA: Diagnosis not present

## 2021-08-14 DIAGNOSIS — N136 Pyonephrosis: Secondary | ICD-10-CM | POA: Diagnosis present

## 2021-08-14 DIAGNOSIS — E1043 Type 1 diabetes mellitus with diabetic autonomic (poly)neuropathy: Secondary | ICD-10-CM | POA: Diagnosis present

## 2021-08-14 DIAGNOSIS — N202 Calculus of kidney with calculus of ureter: Secondary | ICD-10-CM | POA: Diagnosis present

## 2021-08-14 DIAGNOSIS — J449 Chronic obstructive pulmonary disease, unspecified: Secondary | ICD-10-CM | POA: Diagnosis present

## 2021-08-14 DIAGNOSIS — R509 Fever, unspecified: Secondary | ICD-10-CM | POA: Diagnosis present

## 2021-08-14 DIAGNOSIS — Z8616 Personal history of COVID-19: Secondary | ICD-10-CM | POA: Diagnosis not present

## 2021-08-14 DIAGNOSIS — N390 Urinary tract infection, site not specified: Secondary | ICD-10-CM

## 2021-08-14 DIAGNOSIS — Z6833 Body mass index (BMI) 33.0-33.9, adult: Secondary | ICD-10-CM | POA: Diagnosis not present

## 2021-08-14 DIAGNOSIS — Z9889 Other specified postprocedural states: Secondary | ICD-10-CM

## 2021-08-14 DIAGNOSIS — E1165 Type 2 diabetes mellitus with hyperglycemia: Secondary | ICD-10-CM | POA: Diagnosis not present

## 2021-08-14 DIAGNOSIS — M810 Age-related osteoporosis without current pathological fracture: Secondary | ICD-10-CM | POA: Diagnosis present

## 2021-08-14 DIAGNOSIS — I739 Peripheral vascular disease, unspecified: Secondary | ICD-10-CM | POA: Diagnosis not present

## 2021-08-14 DIAGNOSIS — E1065 Type 1 diabetes mellitus with hyperglycemia: Secondary | ICD-10-CM | POA: Diagnosis present

## 2021-08-14 DIAGNOSIS — E1042 Type 1 diabetes mellitus with diabetic polyneuropathy: Secondary | ICD-10-CM | POA: Diagnosis present

## 2021-08-14 LAB — HEMOGLOBIN A1C
Hgb A1c MFr Bld: 8.2 % — ABNORMAL HIGH (ref 4.8–5.6)
Mean Plasma Glucose: 189 mg/dL

## 2021-08-14 LAB — CBC
HCT: 32.9 % — ABNORMAL LOW (ref 36.0–46.0)
Hemoglobin: 10.3 g/dL — ABNORMAL LOW (ref 12.0–15.0)
MCH: 28 pg (ref 26.0–34.0)
MCHC: 31.3 g/dL (ref 30.0–36.0)
MCV: 89.4 fL (ref 80.0–100.0)
Platelets: 216 10*3/uL (ref 150–400)
RBC: 3.68 MIL/uL — ABNORMAL LOW (ref 3.87–5.11)
RDW: 14.3 % (ref 11.5–15.5)
WBC: 13.5 10*3/uL — ABNORMAL HIGH (ref 4.0–10.5)
nRBC: 0 % (ref 0.0–0.2)

## 2021-08-14 LAB — CBG MONITORING, ED
Glucose-Capillary: 199 mg/dL — ABNORMAL HIGH (ref 70–99)
Glucose-Capillary: 184 mg/dL — ABNORMAL HIGH (ref 70–99)
Glucose-Capillary: 120 mg/dL — ABNORMAL HIGH (ref 70–99)
Glucose-Capillary: 177 mg/dL — ABNORMAL HIGH (ref 70–99)

## 2021-08-14 LAB — GLUCOSE, CAPILLARY
Glucose-Capillary: 176 mg/dL — ABNORMAL HIGH (ref 70–99)
Glucose-Capillary: 220 mg/dL — ABNORMAL HIGH (ref 70–99)

## 2021-08-14 LAB — PROCALCITONIN: Procalcitonin: 2.97 ng/mL

## 2021-08-14 LAB — CREATININE, SERUM
Creatinine, Ser: 1.03 mg/dL — ABNORMAL HIGH (ref 0.44–1.00)
GFR, Estimated: >60 mL/min

## 2021-08-14 LAB — POC URINE PREG, ED: Preg Test, Ur: NEGATIVE

## 2021-08-14 LAB — CORTISOL-AM, BLOOD: Cortisol - AM: 10.5 ug/dL (ref 6.7–22.6)

## 2021-08-14 LAB — LACTIC ACID, PLASMA: Lactic Acid, Venous: 1.1 mmol/L (ref 0.5–1.9)

## 2021-08-14 MED ORDER — ROSUVASTATIN CALCIUM 10 MG PO TABS
20.0000 mg | ORAL_TABLET | Freq: Every day | ORAL | Status: DC
  Administered 2021-08-14 – 2021-08-15 (×2): 20 mg via ORAL
  Filled 2021-08-14 (×2): qty 2
  Filled 2021-08-14: qty 1

## 2021-08-14 MED ORDER — ACETAMINOPHEN 325 MG PO TABS
650.0000 mg | ORAL_TABLET | Freq: Four times a day (QID) | ORAL | Status: DC | PRN
  Administered 2021-08-14: 650 mg via ORAL
  Filled 2021-08-14: qty 2

## 2021-08-14 MED ORDER — INSULIN ASPART 100 UNIT/ML IJ SOLN
0.0000 [IU] | Freq: Three times a day (TID) | INTRAMUSCULAR | Status: DC
  Administered 2021-08-14: 4 [IU] via SUBCUTANEOUS
  Filled 2021-08-14: qty 1

## 2021-08-14 MED ORDER — PROCHLORPERAZINE EDISYLATE 10 MG/2ML IJ SOLN
10.0000 mg | Freq: Four times a day (QID) | INTRAMUSCULAR | Status: DC | PRN
  Administered 2021-08-14: 10 mg via INTRAVENOUS
  Filled 2021-08-14: qty 2

## 2021-08-14 MED ORDER — HYDROCODONE-ACETAMINOPHEN 5-325 MG PO TABS: 1.0000 | ORAL_TABLET | Freq: Four times a day (QID) | ORAL | Status: DC | PRN

## 2021-08-14 MED ORDER — ENOXAPARIN SODIUM 40 MG/0.4ML IJ SOSY
0.5000 mg/kg | PREFILLED_SYRINGE | INTRAMUSCULAR | Status: DC
  Administered 2021-08-14 – 2021-08-16 (×3): 37.5 mg via SUBCUTANEOUS
  Filled 2021-08-14 (×3): qty 0.4

## 2021-08-14 MED ORDER — LEVOTHYROXINE SODIUM 50 MCG PO TABS
125.0000 ug | ORAL_TABLET | Freq: Every day | ORAL | Status: DC
  Administered 2021-08-14 – 2021-08-16 (×3): 125 ug via ORAL
  Filled 2021-08-14: qty 3
  Filled 2021-08-14 (×2): qty 1

## 2021-08-14 MED ORDER — ACETAMINOPHEN 650 MG RE SUPP: 650.0000 mg | Freq: Four times a day (QID) | RECTAL | Status: DC | PRN

## 2021-08-14 MED ORDER — METOPROLOL SUCCINATE ER 50 MG PO TB24
50.0000 mg | ORAL_TABLET | Freq: Every day | ORAL | Status: DC
  Administered 2021-08-14 – 2021-08-16 (×3): 50 mg via ORAL
  Filled 2021-08-14 (×3): qty 1

## 2021-08-14 MED ORDER — ONDANSETRON HCL 4 MG PO TABS: 4.0000 mg | ORAL_TABLET | Freq: Four times a day (QID) | ORAL | Status: DC | PRN

## 2021-08-14 MED ORDER — INSULIN ASPART 100 UNIT/ML IJ SOLN: 0.0000 [IU] | Freq: Every day | INTRAMUSCULAR | Status: DC

## 2021-08-14 MED ORDER — INSULIN PUMP
SUBCUTANEOUS | Status: DC
  Filled 2021-08-14: qty 1

## 2021-08-14 MED ORDER — SODIUM CHLORIDE 0.9 % IV SOLN
2.0000 g | Freq: Three times a day (TID) | INTRAVENOUS | Status: DC
  Administered 2021-08-14: 2 g via INTRAVENOUS
  Filled 2021-08-14: qty 2

## 2021-08-14 MED ORDER — MORPHINE SULFATE (PF) 2 MG/ML IV SOLN
2.0000 mg | INTRAVENOUS | Status: DC | PRN
  Administered 2021-08-14 (×2): 2 mg via INTRAVENOUS
  Filled 2021-08-14 (×2): qty 1

## 2021-08-14 MED ORDER — SODIUM CHLORIDE 0.9 % IV SOLN
INTRAVENOUS | Status: DC
Start: 2021-08-15 — End: 2021-08-15

## 2021-08-14 MED ORDER — ONDANSETRON HCL 4 MG/2ML IJ SOLN
4.0000 mg | Freq: Four times a day (QID) | INTRAMUSCULAR | Status: DC | PRN
  Administered 2021-08-14 (×2): 4 mg via INTRAVENOUS
  Filled 2021-08-14 (×2): qty 2

## 2021-08-14 MED ORDER — ALPRAZOLAM 0.5 MG PO TABS
0.5000 mg | ORAL_TABLET | Freq: Three times a day (TID) | ORAL | Status: DC | PRN
  Administered 2021-08-14 – 2021-08-15 (×2): 1 mg via ORAL
  Filled 2021-08-14 (×2): qty 2

## 2021-08-14 MED ORDER — SODIUM CHLORIDE 0.9 % IV SOLN
2.0000 g | INTRAVENOUS | Status: DC
  Administered 2021-08-14 – 2021-08-15 (×2): 2 g via INTRAVENOUS
  Filled 2021-08-14: qty 2
  Filled 2021-08-14 (×2): qty 20

## 2021-08-14 MED ORDER — ALBUTEROL SULFATE (2.5 MG/3ML) 0.083% IN NEBU
2.5000 mg | INHALATION_SOLUTION | RESPIRATORY_TRACT | Status: DC | PRN
  Administered 2021-08-14: 2.5 mg via RESPIRATORY_TRACT
  Filled 2021-08-14 (×2): qty 3

## 2021-08-14 NOTE — Assessment & Plan Note (Addendum)
Blood culture negative, urine culture with E. coli, susceptible to Rocephin.  But resistant to cefazolin Augmentin.  Bacteria also susceptible to Cipro, will continue Cipro for 5 days.  Follow-up with PCP in 1 week. ?

## 2021-08-14 NOTE — Progress Notes (Signed)
Pharmacy Antibiotic Note ? ?Ashley [name] is a 57 y.o. female admitted on 08/13/2021 with sepsis.  Pharmacy has been consulted for Cefepime dosing. ? ?Plan: ?Cefepime 2 gm IV X 1 given in ED on 3/5 @ 2208. ?Cefepime 2 gm IV Q6H ordered to continue on 3/6 @ 0600.  ? ?Height: '4\' 11"'$  (149.9 cm) ?Weight: 74.4 kg (164 lb) ?IBW/kg (Calculated) : 43.2 ? ?Temp (24hrs), Avg:100.1 ?F (37.8 ?C), Min:99.8 ?F (37.7 ?C), Max:100.7 ?F (38.2 ?C) ? ?Recent Labs  ?Lab 08/13/21 ?2200 08/14/21 ?0132  ?WBC 13.4*  --   ?CREATININE 0.96  --   ?LATICACIDVEN 1.6 1.1  ?  ?Estimated Creatinine Clearance: 57.5 mL/min (by C-G formula based on SCr of 0.96 mg/dL).   ? ?Allergies  ?Allergen Reactions  ? Mirtazapine Hives and Rash  ? Clindamycin Hives  ? Macrobid [Nitrofurantoin] Hives  ? Other Hives  ?  Tylox   ? Sulfonamide Derivatives Hives  ? Alendronate Sodium Itching  ? Lipitor [Atorvastatin] Other (See Comments)  ?  MUSCLE ACHES  ? Restasis [Cyclosporine]   ?  Pt stated, "My eyes turned red on the inside and outside of eye; burning sensation"  ? Sulfa Antibiotics Hives  ? Topamax [Topiramate] Itching  ? ? ?Antimicrobials this admission: ?  >>  ?  >>  ? ?Dose adjustments this admission: ? ? ?Microbiology results: ? BCx:  ? UCx:   ? Sputum:   ? MRSA PCR:  ? ?Thank you for allowing pharmacy to be a part of this patient?s care. ? ?An Lannan D ?08/14/2021 2:30 AM ? ?

## 2021-08-14 NOTE — Progress Notes (Signed)
Progress Note  Patient: Ashley [name]:[id number] DOB: 06-13-1965  DOA: 08/13/2021  DOS: 08/14/2021    Brief hospital course: TONIESHA ZELLNER is a 56 y.o. female with a history of T2DM on insulin pump, HTN, OSA on CPAP, recent admission for E. coli bacteremia, urosepsis with right ureterolithiasis s/p emergent stent placement 2/11, given ceftriaxone, then discharged on keflex, subsequently undergoing laser lithotripsy and extraction 3/3. She inadvertently removed the stent 3/5 and presented to the ED with fever, leukocytosis found to have pyuria, hematuria, and moderate right hydronephrosis/hydroureter on CT abd/pelvis. Creatinine at recent baseline 0.9-1.0. IV fluids and broad IV antibiotics were given for sepsis with presumably urinary source. She also complained of loose stools for which stool studies were ordered.   Assessment and Plan: * Sepsis ([place]) Suspect secondary to E. coli UTI per recent culture versus diarrhea  - s/p IVF. Hemodynamically stabilizing - Continue antibiotics and work up as below.  Complicated UTI (urinary tract infection) Suspected etiology of sepsis, recent blood and urine cultures grew E. coli resistant only to ampicillin and amp/sulbactam.  - Continue antibiotics, ok to narrow to ceftriaxone.  - Urology consulted for recommendations regarding premature stent removal and moderate hydroureteronephrosis.  Uncontrolled type 2 diabetes mellitus with hyperglycemia, with long-term current use of insulin ([place]) - Restart pt's insulin pump. DM coordinator consulted. - Also on ASA, statin  Diarrhea - GI panel and stool for C. difficile ordered. Still not yet collected. Abd exam currently without peritonitis.  Obesity (BMI 30-39.9) Body mass index is 33.12 kg/m.   COPD (chronic obstructive pulmonary disease) (HCC) 20 pack year history, followed by [facility]. Also had ARDS due to covid-19 pneumonia Jan 2022 requiring intubation and protracted hypoxic  respiratory failure that has since resolved. - Monitor for hypoxia  Hypothyroidism - Continue levothyroxine  Peripheral arterial disease ([place]) Has had LE angioplasty by Dr. Gwenlyn Found. No current complaints.  - Continue ASA, statin  Essential hypertension BP is elevated, lactic acid wnl.  - Continue home metoprolol  OSA on CPAP - CPAP qHS.   Subjective: Feels tired with some bilateral lower back pain attributed by pt to stretcher she's laying on all night. No confusion per pt or son. No dyspnea or chest pain. No diarrhea since arrival, but was having 8 episodes of loose stool in 24 hour periods for 2 days.   Objective: Vitals:   08/14/21 1230 08/14/21 1300 08/14/21 1330 08/14/21 1400  BP: 138/69 (!) 143/68 (!) 151/63   Pulse: (!) 102 100 (!) 103 (!) 101  Resp: 18 20 (!) 28 (!) 25  Temp:      TempSrc:      SpO2: 96% 97% 96% 98%  Weight:      Height:       Gen: 56 y.o. female in no distress Pulm: Nonlabored breathing room air. Clear CV: Regular tachycardia without murmur, rub, or gallop. No JVD, no pitting dependent edema. GI: Abdomen soft, minimally diffusely tender, but non-distended, with normoactive bowel sounds. No CVA tenderness to percussion. Ext: Warm, no deformities Skin: No rashes, lesions or ulcers on visualized skin. Neuro: Alert and oriented. No focal neurological deficits. Psych: Judgement and insight appear fair. Mood euthymic & affect congruent. Behavior is appropriate.    Data Personally reviewed: CBC: Recent Labs  Lab 08/13/21 2200 08/14/21 0504  WBC 13.4* 13.5*  NEUTROABS 11.0*  --   HGB 11.3* 10.3*  HCT 35.3* 32.9*  MCV 90.1 89.4  PLT 227 811   Basic Metabolic Panel: Recent Labs  Lab 08/13/21 2200 08/14/21 0504  NA 134*  --   K 3.3*  --   CL 98  --   CO2 23  --   GLUCOSE 258*  --   BUN 14  --   CREATININE 0.96 1.03*  CALCIUM 8.7*  --    GFR: Estimated Creatinine Clearance: 53.6 mL/min (A) (by C-G formula based on SCr of 1.03 mg/dL  (H)). Liver Function Tests: Recent Labs  Lab 08/13/21 2200  AST 18  ALT 20  ALKPHOS 91  BILITOT 0.8  PROT 6.8  ALBUMIN 3.4*   No results for input(s): LIPASE, AMYLASE in the last 168 hours. No results for input(s): AMMONIA in the last 168 hours. Coagulation Profile: Recent Labs  Lab 08/13/21 2217  INR 1.1   Cardiac Enzymes: No results for input(s): CKTOTAL, CKMB, CKMBINDEX, TROPONINI in the last 168 hours. BNP (last 3 results) No results for input(s): PROBNP in the last 8760 hours. HbA1C: No results for input(s): HGBA1C in the last 72 hours. CBG: Recent Labs  Lab 08/11/21 1208 08/14/21 0530 08/14/21 0723 08/14/21 1131 08/14/21 1550  GLUCAP 156* 199* 184* 120* 177*   Lipid Profile: No results for input(s): CHOL, HDL, LDLCALC, TRIG, CHOLHDL, LDLDIRECT in the last 72 hours. Thyroid Function Tests: No results for input(s): TSH, T4TOTAL, FREET4, T3FREE, THYROIDAB in the last 72 hours. Anemia Panel: No results for input(s): VITAMINB12, FOLATE, FERRITIN, TIBC, IRON, RETICCTPCT in the last 72 hours. Urine analysis:    Component Value Date/Time   COLORURINE YELLOW (A) 08/13/2021 2217   APPEARANCEUR CLEAR (A) 08/13/2021 2217   LABSPEC 1.011 08/13/2021 2217   PHURINE 7.0 08/13/2021 2217   GLUCOSEU 150 (A) 08/13/2021 2217   HGBUR MODERATE (A) 08/13/2021 2217   BILIRUBINUR NEGATIVE 08/13/2021 2217   KETONESUR 20 (A) 08/13/2021 2217   PROTEINUR 100 (A) 08/13/2021 2217   UROBILINOGEN 0.2 01/11/2010 1909   NITRITE NEGATIVE 08/13/2021 2217   LEUKOCYTESUR NEGATIVE 08/13/2021 2217   Recent Results (from the past 240 hour(s))  Resp Panel by RT-PCR (Flu A&B, Covid) Nasopharyngeal Swab     Status: None   Collection Time: 08/13/21 10:17 PM   Specimen: Nasopharyngeal Swab; Nasopharyngeal(NP) swabs in vial transport medium  Result Value Ref Range Status   SARS Coronavirus 2 by RT PCR NEGATIVE NEGATIVE Final    Comment: (NOTE) SARS-CoV-2 target nucleic acids are NOT  DETECTED.  The SARS-CoV-2 RNA is generally detectable in upper respiratory specimens during the acute phase of infection. The lowest concentration of SARS-CoV-2 viral copies this assay can detect is 138 copies/mL. A negative result does not preclude SARS-Cov-2 infection and should not be used as the sole basis for treatment or other patient management decisions. A negative result may occur with  improper specimen collection/handling, submission of specimen other than nasopharyngeal swab, presence of viral mutation(s) within the areas targeted by this assay, and inadequate number of viral copies(<138 copies/mL). A negative result must be combined with clinical observations, patient history, and epidemiological information. The expected result is Negative.  Fact Sheet for Patients:  EntrepreneurPulse.com.au  Fact Sheet for Healthcare Providers:  IncredibleEmployment.be  This test is no t yet approved or cleared by the [place] FDA and  has been authorized for detection and/or diagnosis of SARS-CoV-2 by FDA under an Emergency Use Authorization (EUA). This EUA will remain  in effect (meaning this test can be used) for the duration of the COVID-19 declaration under Section 564(b)(1) of the Act, 21 U.S.C.section 360bbb-3(b)(1), unless the authorization is terminated  or revoked sooner.       Influenza A by PCR NEGATIVE NEGATIVE Final   Influenza B by PCR NEGATIVE NEGATIVE Final    Comment: (NOTE) The Xpert Xpress SARS-CoV-2/FLU/RSV plus assay is intended as an aid in the diagnosis of influenza from Nasopharyngeal swab specimens and should not be used as a sole basis for treatment. Nasal washings and aspirates are unacceptable for Xpert Xpress SARS-CoV-2/FLU/RSV testing.  Fact Sheet for Patients: EntrepreneurPulse.com.au  Fact Sheet for Healthcare Providers: IncredibleEmployment.be  This test is not yet  approved or cleared by the [place] FDA and has been authorized for detection and/or diagnosis of SARS-CoV-2 by FDA under an Emergency Use Authorization (EUA). This EUA will remain in effect (meaning this test can be used) for the duration of the COVID-19 declaration under Section 564(b)(1) of the Act, 21 U.S.C. section 360bbb-3(b)(1), unless the authorization is terminated or revoked.  Performed at [facility], [address] [place], [place] [address]   Blood Culture (routine x 2)     Status: None (Preliminary result)   Collection Time: 08/13/21 10:17 PM   Specimen: BLOOD  Result Value Ref Range Status   Specimen Description BLOOD RIGHT ANTECUBITAL  Final   Special Requests   Final    BOTTLES DRAWN AEROBIC AND ANAEROBIC Blood Culture results may not be optimal due to an inadequate volume of blood received in culture bottles   Culture   Final    NO GROWTH < 12 HOURS Performed at [facility], [address], [place], [place] [address]    Report Status PENDING  Incomplete  Culture, blood (Routine X 2) w Reflex to ID Panel     Status: None (Preliminary result)   Collection Time: 08/14/21  1:32 AM   Specimen: BLOOD  Result Value Ref Range Status   Specimen Description BLOOD BLOOD RIGHT HAND  Final   Special Requests   Final    BOTTLES DRAWN AEROBIC AND ANAEROBIC Blood Culture adequate volume   Culture   Final    NO GROWTH < 12 HOURS Performed at [facility], [address] [place], [place] [address]    Report Status PENDING  Incomplete     CT ABDOMEN PELVIS W CONTRAST  Result Date: 08/13/2021 CLINICAL DATA:  Recent right kidney stent placement with fever and chills. EXAM: CT ABDOMEN AND PELVIS WITH CONTRAST TECHNIQUE: Multidetector CT imaging of the abdomen and pelvis was performed using the standard protocol following bolus administration of intravenous contrast. RADIATION DOSE REDUCTION: This exam was performed according to the departmental  dose-optimization program which includes automated exposure control, adjustment of the mA and/or kV according to patient size and/or use of iterative reconstruction technique. CONTRAST:  177m OMNIPAQUE IOHEXOL 300 MG/ML  SOLN COMPARISON:  July 22, 2021 FINDINGS: Lower chest: Mild atelectasis is seen within the bilateral lung bases. Hepatobiliary: There is diffuse fatty infiltration of the liver parenchyma. No focal liver abnormality is seen. No gallstones, gallbladder wall thickening, or biliary dilatation. Pancreas: Unremarkable. No pancreatic ductal dilatation or surrounding inflammatory changes. Spleen: Normal in size without focal abnormality. Adrenals/Urinary Tract: Adrenal glands are unremarkable. The kidneys are normal in size. A subcentimeter cystic appearing area is seen within the mid left kidney. Bilateral subcentimeter nonobstructing calculi are present. There is mild right-sided hydronephrosis with moderate severity right-sided hydroureter to the level proximal to the right UVJ. The obstructing right ureteral calculi seen within the distal right ureter on the prior study are no longer visualized. Mild, stable, bilateral  perinephric inflammatory fat stranding is noted. The urinary bladder is moderately distended and otherwise unremarkable. Stomach/Bowel: Stomach is within normal limits. Appendix appears normal. No evidence of bowel wall thickening, distention, or inflammatory changes. Vascular/Lymphatic: Aortic atherosclerosis with bilateral aortoiliac stents. No enlarged abdominal or pelvic lymph nodes. Reproductive: Status post hysterectomy. No adnexal masses. Other: No abdominal wall hernia or abnormality. No abdominopelvic ascites. Musculoskeletal: No acute or significant osseous findings. IMPRESSION: 1. Interval passage of the distal right ureteral calculi seen on the prior study with residual right-sided hydronephrosis and hydroureter. 2. Bilateral subcentimeter nonobstructing renal calculi.  3. Hepatic steatosis. Aortic Atherosclerosis (ICD10-I70.0). Electronically Signed   By: Virgina Norfolk M.D.   On: 08/13/2021 23:58   DG Chest Port 1 View  Result Date: 08/13/2021 CLINICAL DATA:  Questionable sepsis. EXAM: PORTABLE CHEST 1 VIEW COMPARISON:  Radiograph 07/22/2021 FINDINGS: The cardiomediastinal contours are normal. Mild chronic bronchial thickening. Pulmonary vasculature is normal. No consolidation, pleural effusion, or pneumothorax. No acute osseous abnormalities are seen. IMPRESSION: Mild chronic bronchial thickening without acute abnormality. Electronically Signed   By: Keith Rake M.D.   On: 08/13/2021 22:09    SARS-CoV-2 PCR: Negative Influenza A/B: Negative  Family Communication: Son at bedside  Disposition: Status is: Inpatient Remains inpatient appropriate because: Sepsis, IV abx Planned Discharge Destination: Home      Patrecia Pour, MD 08/14/2021 4:19 PM Page by [email or web address]

## 2021-08-14 NOTE — ED Notes (Signed)
Pt removed insulin pump with CBG reading at 1130 of 120. Pt states 120 is low for her. When recheck CBG pt will see if need to reconnect pump. Provider informed. ?

## 2021-08-14 NOTE — Progress Notes (Addendum)
Inpatient Diabetes Program Recommendations ? ?AACE/ADA: New Consensus Statement on Inpatient Glycemic Control (2015) ? ?Target Ranges:  Prepandial:   less than 140 mg/dL ?     Peak postprandial:   less than 180 mg/dL (1-2 hours) ?     Critically ill patients:  140 - 180 mg/dL  ? ? Latest Reference Range & Units 08/14/21 05:30 08/14/21 07:23  ?Glucose-Capillary 70 - 99 mg/dL 199 (H) 184 (H)  ?(H): Data is abnormally high ? ? ? ?Admit with ?Sepsis ?Here 03/03 for Right ureteroscopy, laser lithotripsy, stent placement--d/c'd home same day ? ?History: DM ? ?Home DM Meds: Insulin Pump ? ?Current Orders: Insulin Pump Q4 hours ? ? ?--Insulin Pump Settings-- ? ?Basal Rates: ?12 am - 2 am = 1.3 units/hr  ?2 am - 6 am = 1.4 units/hr ?6 am - 3 pm = 1.8 units/hr  ?3 pm - 12 am = 1.55 units/hr ? ?Total Basal Insulin per 24 hours period= 38.35 units ? ?Carbohydrate Ratio: 1 unit for every 6 Grams of Carbohydrates ? ?Correction/Sensitivity Factor: 1 unit for every 20 mg/dl above Target CBG ? ?Target CBG: 120 mg/dl ? ? ? ? ?PCP: Dr. Dagmar Hait with [facility] ? ? ?Addendum 11am--Met w/ pt down in the ED.  Son at bedside.  Pt appeared very uncomfortable but she is Alert and Oriented and able to answer all questions to orientation including Current Month, Current, year, Current President, Current Location.  Has [employer] 670g insulin pump managed by her PCP office.  We reviewed her current insulin pump settings (see above).  Current Insertion site and reservoir was placed early AM hours on Saturday March 4th (approx 4am per son).  Set/site/ Insulin reservoir will need to be changed Tuesday AM 03/06.  Son stated he can go home and get pt more pump supplies.  Pt stated she is OK using her insulin pump in the hospital.  Discussed with pt that if she becomes disoriented or too sick to handle her pump independently, that we will need to switch her to SQ Basal/Bolus regimen and that we will use her current pump settings to help  guide [place] with insulin orders.  Pt stated understanding and is agreeable and currently desires to use her pump at present.   ?RN aware that pt has Insulin pump on--Asked ED RN to report to accepting RN on unit that pt has pump.  Reviewed charting needs with ED RN.  Alerted Dr. Bonner Puna that pt using insulin pump and permission given by MD to this RN to place insulin pump orders with CBG checks Q4H.  Pump orders entered. ? ? ? ?--Will follow patient during hospitalization-- ? ?Wyn Quaker RN, MSN, CDE ?Diabetes Coordinator ?Inpatient Glycemic Control Team ?Team Pager: [phone] (8a-5p) ? ? ? ? ? ? ?

## 2021-08-14 NOTE — Assessment & Plan Note (Addendum)
BP is elevated, lactic acid wnl.  ?- Continue home metoprolol ?

## 2021-08-14 NOTE — ED Notes (Signed)
Helped pt reposition in bed. Retook temp. Pt still quite flushed and warm to the touch. Son at bedside. ?

## 2021-08-14 NOTE — Assessment & Plan Note (Addendum)
-   CPAP qHS. ?

## 2021-08-14 NOTE — Assessment & Plan Note (Addendum)
Diarrhea resolved.   ?

## 2021-08-14 NOTE — Assessment & Plan Note (Addendum)
Secondary to UTI. ?Condition has improved. ?

## 2021-08-14 NOTE — Assessment & Plan Note (Signed)
Has had LE angioplasty by Dr. Gwenlyn Found. No current complaints.  ?- Continue ASA, statin ?

## 2021-08-14 NOTE — ED Notes (Signed)
Diabetes coordinator at bedside with pt and son. ?

## 2021-08-14 NOTE — Assessment & Plan Note (Addendum)
20 pack year history, followed by [facility]. Also had ARDS due to covid-19 pneumonia Jan 2022 requiring intubation and protracted hypoxic respiratory failure that has since resolved. ?Condition stable since admission ?

## 2021-08-14 NOTE — Care Management (Signed)
Floor coverage. ?C/o nausea. Order place for compazine in addition to zofran and restarted xanax at pt request. Home med rec not completed. ?

## 2021-08-14 NOTE — ED Notes (Signed)
Spoke with attending provider and diabetes RN. Pt was instructed to reconnect insulin pump and she did so.  ?

## 2021-08-14 NOTE — ED Notes (Signed)
CBG 177 at this time. Pt has insulin pump connected. ?

## 2021-08-14 NOTE — Assessment & Plan Note (Signed)
Body mass index is 33.12 kg/m.

## 2021-08-14 NOTE — Assessment & Plan Note (Addendum)
Resume home regimens. ?

## 2021-08-14 NOTE — Consult Note (Signed)
Urology Consult  Requesting physician: Vance Gather, MD  Reason for consultation: UTI, recent lithotripsy/ureteral stent  Chief Complaint: Flank pain  History of Present Illness: Ashley [name] is a 56 y.o. female with a prior history of an 8 mm right distal ureteral stone and sepsis from urinary source who underwent emergent stent placement by Dr. Diamantina Providence on 07/22/2021.  Status post right ureteroscopy with laser lithotripsy/stone removal by Dr. Diamantina Providence 08/11/2021 A ureteral stent was placed postoperatively with tether and she was instructed to remove on 08/15/2021 She accidentally removed her stent yesterday and had onset of right flank pain, nausea, vomiting and subjective fever She was transported to the ED via EMS.  Vitals in ED remarkable for temp 100.7 and BP 156/62.  She had a normal lactate and WBC 13,000.  UA had 11-20 RBC and 6-10 WBC CT abdomen/pelvis with contrast with moderate hydronephrosis/hydroureter.  No residual stone fragments identified   Past Medical History:  Diagnosis Date   Anxiety    COPD (chronic obstructive pulmonary disease) (HCC)    Depression    DOE (dyspnea on exertion)    Dyspnea    Gastroparesis    GERD (gastroesophageal reflux disease)    Glaucoma, both eyes    History of 2019 novel coronavirus disease (COVID-19) 06/20/2020   hospital admission-- dx severe covid with pneumonia, ARDS, DKA, COPD exaberation;  pt intubated 01-13th and extubated 01-20th;   (11-15-2020  per pt did not go home on oxygen, residual generalized weakness and hair loss)   History of esophageal stricture    post dilatation, followed by dr Henrene Pastor   History of Graves' disease    s/p RAI  [id number]   History of Helicobacter pylori infection    remote hx and treatment   History of kidney stones    Hyperlipidemia    Hypertension    followed by pcp   Hypothyroidism, postradioiodine therapy 02/2010   followed by pcp   IDA (iron deficiency anemia)    Insulin pump in place     w/ Novolog , managed by pcp   Moderate asthma    followed by pcp,  (11-15-2020  per pt has pulmonology appt on 11-17-2020))   OSA (obstructive sleep apnea)    11-15-2020  per pt has not used cpap since 05/ 2021  (previous followed by dr Brett Fairy,  study in epic 05-12-2018 moderate complex osa pt had used for awhile)   Osteoporosis    PAD (peripheral artery disease) ([place])    followed by dr berry----  s/p bilateral common iliac artery angioplasty stenting for stenosis   Peripheral neuropathy    Pneumonia    covid 2022   PONV (postoperative nausea and vomiting)    Rheumatoid arthritis ([place])    in both eyes   Right wrist fracture    Type 1 diabetes mellitus with long-term current use of insulin ([place])    per pt dx at age 87 approx,  followed by pcp,  (11-15-2020  per pt last A1c 7.2 in 04/ 2022),  checks blood sugar 6 to 10 times daily , fasting sugar-- 200 -- 300)   Vitamin D deficiency     Past Surgical History:  Procedure Laterality Date   ABDOMINAL AORTOGRAM W/LOWER EXTREMITY Bilateral 01/01/2019   Procedure: ABDOMINAL AORTOGRAM W/LOWER EXTREMITY;  Surgeon: Lorretta Harp, MD;  Location: [facility];  Service: Cardiovascular;  Laterality: Bilateral;   BREAST EXCISIONAL BIOPSY Left 04-*02-2000  '@[facility]'$    per pt benign   BREAST SURGERY  CATARACT EXTRACTION W/ INTRAOCULAR LENS  IMPLANT, BILATERAL  11/2019   COLONOSCOPY  last one 2019 approx   CYSTOSCOPY WITH STENT PLACEMENT Right 07/22/2021   Procedure: CYSTOSCOPY WITH STENT PLACEMENT;  Surgeon: Billey Co, MD;  Location: [facility];  Service: Urology;  Laterality: Right;   CYSTOSCOPY/URETEROSCOPY/HOLMIUM LASER/STENT PLACEMENT Right 08/11/2021   Procedure: CYSTOSCOPY/URETEROSCOPY/HOLMIUM LASER/STENT EXCHANGE;  Surgeon: Billey Co, MD;  Location: [facility];  Service: Urology;  Laterality: Right;   FRACTURE SURGERY     LAPAROSCOPIC ASSISTED VAGINAL HYSTERECTOMY  06-22-2004 '@[facility]'$    NASAL SEPTOPLASTY W/ TURBINOPLASTY  Bilateral 09/07/2013   Procedure: SEPTOPLASTY, BILATERAL TURBINATE RESECTION ;  Surgeon: Ascencion Dike, MD;  Location: [facility];  Service: ENT;  Laterality: Bilateral;   OPEN REDUCTION INTERNAL FIXATION (ORIF) DISTAL RADIAL FRACTURE Right 11/16/2020   Procedure: OPEN REDUCTION INTERNAL FIXATION (ORIF) DISTAL RADIAL FRACTURE;  Surgeon: Hiram Gash, MD;  Location: [facility];  Service: Orthopedics;  Laterality: Right;   PERIPHERAL VASCULAR INTERVENTION Bilateral 01/01/2019   Procedure: PERIPHERAL VASCULAR INTERVENTION;  Surgeon: Lorretta Harp, MD;  Location: [facility];  Service: Cardiovascular;  Laterality: Bilateral;   UPPER GASTROINTESTINAL ENDOSCOPY  last one 07-23-2019  dr Henrene Pastor    Home Medications:  Current Meds  Medication Sig   ALPRAZolam (XANAX) 0.5 MG tablet Take 0.5-1 mg by mouth 3 (three) times daily as needed for anxiety.   aspirin EC 81 MG tablet Take 81 mg by mouth daily. Swallow whole.   Biotin w/ Vitamins C & E (HAIR/SKIN/NAILS PO) Take 1 capsule by mouth at bedtime.   Budeson-Glycopyrrol-Formoterol (BREZTRI AEROSPHERE) 160-9-4.8 MCG/ACT AERO Inhale 2 puffs into the lungs in the morning and at bedtime. (Patient taking differently: Inhale 1 puff into the lungs every morning.)   cephALEXin (KEFLEX) 500 MG capsule Take 1 capsule (500 mg total) by mouth daily.   clopidogrel (PLAVIX) 75 MG tablet TAKE 1 TABLET EVERY DAY WITH BREAKFAST   DULoxetine (CYMBALTA) 30 MG capsule Take 30 mg by mouth every morning.   folic acid (FOLVITE) 1 MG tablet Take 1 tablet by mouth daily.   HYDROcodone-acetaminophen (NORCO/VICODIN) 5-325 MG tablet Take 1 tablet by mouth every 6 (six) hours as needed for up to 3 days for moderate pain.   Insulin Human (INSULIN PUMP) SOLN Inject into the skin. Novolog----  basal rate--- 12 am - 2 am = 1.3, 2 am - 6 am = 1.4, 6 am - 3 pm = 1.8, 3 pm - 12 am = 1.55   lansoprazole (PREVACID) 30 MG capsule Take 1 capsule (30 mg  total) by mouth 2 (two) times daily before a meal.   levocetirizine (XYZAL) 5 MG tablet Take 5 mg by mouth every morning.    levothyroxine (SYNTHROID) 125 MCG tablet Take 125 mcg by mouth daily before breakfast.   metoprolol succinate (TOPROL-XL) 50 MG 24 hr tablet Take 50 mg by mouth daily. Take with or immediately following a meal.   NOVOLOG 100 UNIT/ML injection Inject into the skin See admin instructions. Pt has Insulin pump   pregabalin (LYRICA) 75 MG capsule Take 75-150 mg by mouth See admin instructions. Take 75 mg in the morning, 75 mg at lunch, and 150 mg at bedtime   sertraline (ZOLOFT) 50 MG tablet Take 50 mg by mouth daily.    Allergies:  Allergies  Allergen Reactions   Mirtazapine Hives and Rash   Clindamycin Hives   Macrobid [Nitrofurantoin] Hives   Other Hives  Tylox    Sulfonamide Derivatives Hives   Alendronate Sodium Itching   Lipitor [Atorvastatin] Other (See Comments)    MUSCLE ACHES   Restasis [Cyclosporine]     Pt stated, "My eyes turned red on the inside and outside of eye; burning sensation"   Sulfa Antibiotics Hives   Topamax [Topiramate] Itching    Family History  Problem Relation Age of Onset   Diabetes Father    Heart attack Father    Alcoholism Father    Lung cancer Mother    Liver cancer Mother    Irritable bowel syndrome Sister    Colon cancer Neg Hx    Esophageal cancer Neg Hx    Rectal cancer Neg Hx    Stomach cancer Neg Hx     Social History:  reports that she quit smoking about 13 months ago. Her smoking use included cigarettes. She started smoking about 41 years ago. She has a 12.50 pack-year smoking history. She has never used smokeless tobacco. She reports that she does not drink alcohol and does not use drugs.  ROS: As per the HPI  Physical Exam:  Vital signs in last 24 hours: Temp:  [99 F (37.2 C)-100.7 F (38.2 C)] 99.6 F (37.6 C) (03/06 1025) Pulse Rate:  [97-117] 109 (03/06 1100) Resp:  [12-30] 22 (03/06 1100) BP:  (122-171)/(58-92) 150/71 (03/06 1100) SpO2:  [90 %-99 %] 93 % (03/06 1100) Weight:  [74.4 kg] 74.4 kg (03/05 2150) Constitutional:  Alert and oriented, mild-moderate distress secondary to flank pain HEENT: [place] AT, moist mucus membranes.  Trachea midline, no masses Cardiovascular: Regular rate and rhythm, no clubbing, cyanosis, or edema. Respiratory: Normal respiratory effort, lungs clear bilaterally GU: Mild CVA tenderness   Laboratory Data:  Recent Labs    08/13/21 2200 08/14/21 0504  WBC 13.4* 13.5*  HGB 11.3* 10.3*  HCT 35.3* 32.9*   Recent Labs    08/13/21 2200 08/14/21 0504  NA 134*  --   K 3.3*  --   CL 98  --   CO2 23  --   GLUCOSE 258*  --   BUN 14  --   CREATININE 0.96 1.03*  CALCIUM 8.7*  --    Recent Labs    08/13/21 2217  INR 1.1   No results for input(s): LABURIN in the last 72 hours. Results for orders placed or performed during the hospital encounter of 08/13/21  Resp Panel by RT-PCR (Flu A&B, Covid) Nasopharyngeal Swab     Status: None   Collection Time: 08/13/21 10:17 PM   Specimen: Nasopharyngeal Swab; Nasopharyngeal(NP) swabs in vial transport medium  Result Value Ref Range Status   SARS Coronavirus 2 by RT PCR NEGATIVE NEGATIVE Final    Comment: (NOTE) SARS-CoV-2 target nucleic acids are NOT DETECTED.  The SARS-CoV-2 RNA is generally detectable in upper respiratory specimens during the acute phase of infection. The lowest concentration of SARS-CoV-2 viral copies this assay can detect is 138 copies/mL. A negative result does not preclude SARS-Cov-2 infection and should not be used as the sole basis for treatment or other patient management decisions. A negative result may occur with  improper specimen collection/handling, submission of specimen other than nasopharyngeal swab, presence of viral mutation(s) within the areas targeted by this assay, and inadequate number of viral copies(<138 copies/mL). A negative result must be combined  with clinical observations, patient history, and epidemiological information. The expected result is Negative.  Fact Sheet for Patients:  EntrepreneurPulse.com.au  Fact Sheet for Healthcare Providers:  IncredibleEmployment.be  This test is no t yet approved or cleared by the [place] and  has been authorized for detection and/or diagnosis of SARS-CoV-2 by FDA under an Emergency Use Authorization (EUA). This EUA will remain  in effect (meaning this test can be used) for the duration of the COVID-19 declaration under Section 564(b)(1) of the Act, 21 U.S.C.section 360bbb-3(b)(1), unless the authorization is terminated  or revoked sooner.       Influenza A by PCR NEGATIVE NEGATIVE Final   Influenza B by PCR NEGATIVE NEGATIVE Final    Comment: (NOTE) The Xpert Xpress SARS-CoV-2/FLU/RSV plus assay is intended as an aid in the diagnosis of influenza from Nasopharyngeal swab specimens and should not be used as a sole basis for treatment. Nasal washings and aspirates are unacceptable for Xpert Xpress SARS-CoV-2/FLU/RSV testing.  Fact Sheet for Patients: EntrepreneurPulse.com.au  Fact Sheet for Healthcare Providers: IncredibleEmployment.be  This test is not yet approved or cleared by the [place] FDA and has been authorized for detection and/or diagnosis of SARS-CoV-2 by FDA under an Emergency Use Authorization (EUA). This EUA will remain in effect (meaning this test can be used) for the duration of the COVID-19 declaration under Section 564(b)(1) of the Act, 21 U.S.C. section 360bbb-3(b)(1), unless the authorization is terminated or revoked.  Performed at [facility] [address] [place], [place] [address]   Blood Culture (routine x 2)     Status: None (Preliminary result)   Collection Time: 08/13/21 10:17 PM   Specimen: BLOOD  Result Value Ref Range Status   Specimen  Description BLOOD RIGHT ANTECUBITAL  Final   Special Requests   Final    BOTTLES DRAWN AEROBIC AND ANAEROBIC Blood Culture results may not be optimal due to an inadequate volume of blood received in culture bottles   Culture   Final    NO GROWTH < 12 HOURS Performed at [facility], [address], [place], [place] [address]    Report Status PENDING  Incomplete  Culture, blood (Routine X 2) w Reflex to ID Panel     Status: None (Preliminary result)   Collection Time: 08/14/21  1:32 AM   Specimen: BLOOD  Result Value Ref Range Status   Specimen Description BLOOD BLOOD RIGHT HAND  Final   Special Requests   Final    BOTTLES DRAWN AEROBIC AND ANAEROBIC Blood Culture adequate volume   Culture   Final    NO GROWTH < 12 HOURS Performed at [facility], [address], [place], [place] [address]    Report Status PENDING  Incomplete     Radiologic Imaging: CT ABDOMEN PELVIS W CONTRAST  Result Date: 08/13/2021 CLINICAL DATA:  Recent right kidney stent placement with fever and chills. EXAM: CT ABDOMEN AND PELVIS WITH CONTRAST TECHNIQUE: Multidetector CT imaging of the abdomen and pelvis was performed using the standard protocol following bolus administration of intravenous contrast. RADIATION DOSE REDUCTION: This exam was performed according to the departmental dose-optimization program which includes automated exposure control, adjustment of the mA and/or kV according to patient size and/or use of iterative reconstruction technique. CONTRAST:  15m OMNIPAQUE IOHEXOL 300 MG/ML  SOLN COMPARISON:  July 22, 2021 FINDINGS: Lower chest: Mild atelectasis is seen within the bilateral lung bases. Hepatobiliary: There is diffuse fatty infiltration of the liver parenchyma. No focal liver abnormality is seen. No gallstones, gallbladder wall thickening, or biliary dilatation. Pancreas: Unremarkable. No pancreatic ductal dilatation or surrounding inflammatory changes. Spleen: Normal in  size without  focal abnormality. Adrenals/Urinary Tract: Adrenal glands are unremarkable. The kidneys are normal in size. A subcentimeter cystic appearing area is seen within the mid left kidney. Bilateral subcentimeter nonobstructing calculi are present. There is mild right-sided hydronephrosis with moderate severity right-sided hydroureter to the level proximal to the right UVJ. The obstructing right ureteral calculi seen within the distal right ureter on the prior study are no longer visualized. Mild, stable, bilateral perinephric inflammatory fat stranding is noted. The urinary bladder is moderately distended and otherwise unremarkable. Stomach/Bowel: Stomach is within normal limits. Appendix appears normal. No evidence of bowel wall thickening, distention, or inflammatory changes. Vascular/Lymphatic: Aortic atherosclerosis with bilateral aortoiliac stents. No enlarged abdominal or pelvic lymph nodes. Reproductive: Status post hysterectomy. No adnexal masses. Other: No abdominal wall hernia or abnormality. No abdominopelvic ascites. Musculoskeletal: No acute or significant osseous findings. IMPRESSION: 1. Interval passage of the distal right ureteral calculi seen on the prior study with residual right-sided hydronephrosis and hydroureter. 2. Bilateral subcentimeter nonobstructing renal calculi. 3. Hepatic steatosis. Aortic Atherosclerosis (ICD10-I70.0). Electronically Signed   By: Virgina Norfolk M.D.   On: 08/13/2021 23:58   DG Chest Port 1 View  Result Date: 08/13/2021 CLINICAL DATA:  Questionable sepsis. EXAM: PORTABLE CHEST 1 VIEW COMPARISON:  Radiograph 07/22/2021 FINDINGS: The cardiomediastinal contours are normal. Mild chronic bronchial thickening. Pulmonary vasculature is normal. No consolidation, pleural effusion, or pneumothorax. No acute osseous abnormalities are seen. IMPRESSION: Mild chronic bronchial thickening without acute abnormality. Electronically Signed   By: Keith Rake M.D.   On:  08/13/2021 22:09    Impression/Assessment:  Status post right ureteroscopic stone removal with inadvertent stent removal Temp has been 99 after her initial temp of 100.7 She has been covered with broad-spectrum antibiotics CT with moderate hydronephrosis/hydroureter.  No stone fragments identified and this would be secondary to ureteral edema Lactate level is normal  Plan:  Continue broad-spectrum antibiotics pending urine culture result Typically ureteral edema will resolve within 24 hours If clinical status deteriorates i.e. hypertension, worsening fever would recommend replacement of her ureteral stent.  Will make patient n.p.o. for the next several hours   08/14/2021, 12:42 PM  John Giovanni,  MD

## 2021-08-14 NOTE — Assessment & Plan Note (Addendum)
Continue levothyroxine 

## 2021-08-14 NOTE — ED Notes (Signed)
Just found out that pt has insulin pump that delivers subcut insulin to pt. Informed attending provider. ? ? ?

## 2021-08-14 NOTE — Progress Notes (Signed)
Anticoagulation monitoring(Lovenox): ? ?56 yo female ordered Lovenox 40 mg Q24h ?   ?Filed Weights  ? 08/13/21 2150  ?Weight: 74.4 kg (164 lb)  ? ?BMI 33  ? ?Lab Results  ?Component Value Date  ? CREATININE 0.96 08/13/2021  ? CREATININE 1.07 (H) 07/25/2021  ? CREATININE 0.86 07/24/2021  ? ?Estimated Creatinine Clearance: 57.5 mL/min (by C-G formula based on SCr of 0.96 mg/dL). ?Hemoglobin & Hematocrit  ?   ?Component Value Date/Time  ? HGB 11.3 (L) 08/13/2021 2200  ? HGB 11.6 01/09/2019 1204  ? HCT 35.3 (L) 08/13/2021 2200  ? HCT 36.2 01/09/2019 1204  ? ? ? ?Per Protocol for Patient with estCrcl > 30 ml/min and BMI > 30, will transition to Lovenox 37.5 mg Q24h.  ?  ? ? ?

## 2021-08-14 NOTE — H&P (Signed)
History and Physical    Patient: Ashley [name] DOB: 06/30/1965 DOA: 08/13/2021 DOS: the patient was seen and examined on 08/14/2021 PCP: Prince Solian, MD  Patient coming from: Home  Chief Complaint:  Chief Complaint  Patient presents with   Code Sepsis    HPI: Ashley [name] is a 56 y.o. female with medical history significant for DM, HTN, OSA on CPAP, recent hospitalization for sepsis secondary to UTI and infected stone s/p ureteral stent placement on 3/3 who presents to the ED with chills, fever, nausea and body aches that started within hours of patient pulling out the stent.  Most of the history is given by her son at the bedside who states that patient appeared confused and pulled out the stent which was supposed to remain in place for another few days.  He does report that she has been having diarrhea for the past 2 days.  ED course: Tmax 100.7 with pulse 117, BP 156/62 Blood work: WBC 13,000 with lactic acid 1.6.  Hemoglobin 11.3.  Glucose 253, potassium 3.3.  Urinalysis with negative nitrite and leukocyte esterase and rare bacteria.  COVID and flu negative EKG, personally viewed and interpreted: Sinus tachycardia at 121 with no acute ST-T wave changes CT abdomen and pelvis showed the following: IMPRESSION: 1. Interval passage of the distal right ureteral calculi seen on the prior study with residual right-sided hydronephrosis and hydroureter. 2. Bilateral subcentimeter nonobstructing renal calculi. 3. Hepatic steatosis.  Patient treated with IV sepsis fluids, given cefepime Flagyl and vancomycin.  Hospitalist consulted for admission. Review of Systems: unable to review all systems due to the inability of the patient to answer questions. Past Medical History:  Diagnosis Date   Anxiety    COPD (chronic obstructive pulmonary disease) (HCC)    Depression    DOE (dyspnea on exertion)    Dyspnea    Gastroparesis    GERD (gastroesophageal reflux disease)     Glaucoma, both eyes    History of 2019 novel coronavirus disease (COVID-19) 06/20/2020   hospital admission-- dx severe covid with pneumonia, ARDS, DKA, COPD exaberation;  pt intubated 01-13th and extubated 01-20th;   (11-15-2020  per pt did not go home on oxygen, residual generalized weakness and hair loss)   History of esophageal stricture    post dilatation, followed by dr Henrene Pastor   History of Graves' disease    s/p RAI  [id number]   History of Helicobacter pylori infection    remote hx and treatment   History of kidney stones    Hyperlipidemia    Hypertension    followed by pcp   Hypothyroidism, postradioiodine therapy 02/2010   followed by pcp   IDA (iron deficiency anemia)    Insulin pump in place    w/ Novolog , managed by pcp   Moderate asthma    followed by pcp,  (11-15-2020  per pt has pulmonology appt on 11-17-2020))   OSA (obstructive sleep apnea)    11-15-2020  per pt has not used cpap since 05/ 2021  (previous followed by dr Brett Fairy,  study in epic 05-12-2018 moderate complex osa pt had used for awhile)   Osteoporosis    PAD (peripheral artery disease) ([place])    followed by dr berry----  s/p bilateral common iliac artery angioplasty stenting for stenosis   Peripheral neuropathy    Pneumonia    covid 2022   PONV (postoperative nausea and vomiting)    Rheumatoid arthritis ([place])    in both eyes  Right wrist fracture    Type 1 diabetes mellitus with long-term current use of insulin ([place])    per pt dx at age 79 approx,  followed by pcp,  (11-15-2020  per pt last A1c 7.2 in 04/ 2022),  checks blood sugar 6 to 10 times daily , fasting sugar-- 200 -- 300)   Vitamin D deficiency    Past Surgical History:  Procedure Laterality Date   ABDOMINAL AORTOGRAM W/LOWER EXTREMITY Bilateral 01/01/2019   Procedure: ABDOMINAL AORTOGRAM W/LOWER EXTREMITY;  Surgeon: Lorretta Harp, MD;  Location: [facility];  Service: Cardiovascular;  Laterality: Bilateral;   BREAST EXCISIONAL  BIOPSY Left 04-*02-2000  '@[facility]'    per pt benign   BREAST SURGERY     CATARACT EXTRACTION W/ INTRAOCULAR LENS  IMPLANT, BILATERAL  11/2019   COLONOSCOPY  last one 2019 approx   CYSTOSCOPY WITH STENT PLACEMENT Right 07/22/2021   Procedure: CYSTOSCOPY WITH STENT PLACEMENT;  Surgeon: Billey Co, MD;  Location: [facility];  Service: Urology;  Laterality: Right;   CYSTOSCOPY/URETEROSCOPY/HOLMIUM LASER/STENT PLACEMENT Right 08/11/2021   Procedure: CYSTOSCOPY/URETEROSCOPY/HOLMIUM LASER/STENT EXCHANGE;  Surgeon: Billey Co, MD;  Location: [facility];  Service: Urology;  Laterality: Right;   FRACTURE SURGERY     LAPAROSCOPIC ASSISTED VAGINAL HYSTERECTOMY  06-22-2004 [facility]    NASAL SEPTOPLASTY W/ TURBINOPLASTY Bilateral 09/07/2013   Procedure: SEPTOPLASTY, BILATERAL TURBINATE RESECTION ;  Surgeon: Ascencion Dike, MD;  Location: [facility];  Service: ENT;  Laterality: Bilateral;   OPEN REDUCTION INTERNAL FIXATION (ORIF) DISTAL RADIAL FRACTURE Right 11/16/2020   Procedure: OPEN REDUCTION INTERNAL FIXATION (ORIF) DISTAL RADIAL FRACTURE;  Surgeon: Hiram Gash, MD;  Location: [facility];  Service: Orthopedics;  Laterality: Right;   PERIPHERAL VASCULAR INTERVENTION Bilateral 01/01/2019   Procedure: PERIPHERAL VASCULAR INTERVENTION;  Surgeon: Lorretta Harp, MD;  Location: [facility];  Service: Cardiovascular;  Laterality: Bilateral;   UPPER GASTROINTESTINAL ENDOSCOPY  last one 07-23-2019  dr Henrene Pastor   Social History:  reports that she quit smoking about 13 months ago. Her smoking use included cigarettes. She started smoking about 41 years ago. She has a 12.50 pack-year smoking history. She has never used smokeless tobacco. She reports that she does not drink alcohol and does not use drugs.  Allergies  Allergen Reactions   Mirtazapine Hives and Rash   Clindamycin Hives   Macrobid [Nitrofurantoin] Hives   Other Hives    Tylox    Sulfonamide Derivatives Hives    Alendronate Sodium Itching   Lipitor [Atorvastatin] Other (See Comments)    MUSCLE ACHES   Restasis [Cyclosporine]     Pt stated, "My eyes turned red on the inside and outside of eye; burning sensation"   Sulfa Antibiotics Hives   Topamax [Topiramate] Itching    Family History  Problem Relation Age of Onset   Diabetes Father    Heart attack Father    Alcoholism Father    Lung cancer Mother    Liver cancer Mother    Irritable bowel syndrome Sister    Colon cancer Neg Hx    Esophageal cancer Neg Hx    Rectal cancer Neg Hx    Stomach cancer Neg Hx     Prior to Admission medications   Medication Sig Start Date End Date Taking? Authorizing Provider  ACCU-CHEK COMPACT PLUS test strip 1 each by Other route as needed.  10/20/12   [provider]  ACCU-CHEK SOFTCLIX LANCETS lancets  05/19/18   [provider]  acetaminophen (TYLENOL) 500 MG tablet Take 1,000 mg by mouth every 6 (six) hours as needed for moderate pain.    [provider]  albuterol (PROVENTIL) (2.5 MG/3ML) 0.083% nebulizer solution Take 3 mLs (2.5 mg total) by nebulization every 4 (four) hours as needed for wheezing or shortness of breath. 06/07/21   Icard, Octavio Graves, DO  Albuterol Sulfate, sensor, (PROAIR DIGIHALER) 108 (90 Base) MCG/ACT AEPB Inhale 2 puffs into the lungs every 6 (six) hours as needed (shortness of breath).    [provider]  ALPRAZolam Duanne Moron) 0.5 MG tablet Take 0.5-1 mg by mouth 3 (three) times daily as needed for anxiety. 10/08/12   [provider]  AMBULATORY NON [place] INSULIN PUMP USES AS DIRECTED    [provider]  aspirin EC 81 MG tablet Take 81 mg by mouth daily. Swallow whole.    [provider]  Biotin w/ Vitamins C & E (HAIR/SKIN/NAILS PO) Take 1 capsule by mouth at bedtime.    [provider]  Blood Glucose Monitoring Suppl (ACCU-CHEK AVIVA PLUS) w/Device KIT  06/08/20   [provider]   Budeson-Glycopyrrol-Formoterol (BREZTRI AEROSPHERE) 160-9-4.8 MCG/ACT AERO Inhale 2 puffs into the lungs in the morning and at bedtime. Patient taking differently: Inhale 1 puff into the lungs every morning. 06/07/21   Icard, Leory Plowman L, DO  budesonide (PULMICORT) 0.25 MG/2ML nebulizer solution Take 2 mLs (0.25 mg total) by nebulization 2 (two) times daily as needed (wheezing/dyspnea). 07/25/21 10/23/21  Aline August, MD  cephALEXin (KEFLEX) 500 MG capsule Take 1 capsule (500 mg total) by mouth daily. 08/11/21   Billey Co, MD  clopidogrel (PLAVIX) 75 MG tablet TAKE 1 TABLET EVERY DAY WITH BREAKFAST 06/26/21   Lorretta Harp, MD  DULoxetine (CYMBALTA) 30 MG capsule Take 30 mg by mouth every morning. 01/22/18   [provider]  estradiol (ESTRACE) 1 MG tablet Take 1 mg by mouth at bedtime.    [provider]  famotidine (PEPCID) 40 MG tablet TAKE 1 TABLET AT BEDTIME 12/14/20   Irene Shipper, MD  folic acid (FOLVITE) 1 MG tablet Take 1 tablet by mouth daily. 06/23/19   [provider]  HYDROcodone-acetaminophen (NORCO/VICODIN) 5-325 MG tablet Take 1 tablet by mouth every 6 (six) hours as needed for up to 3 days for moderate pain. 08/11/21 08/14/21  Billey Co, MD  Insulin Human (INSULIN PUMP) SOLN Inject into the skin. Novolog----  basal rate--- 12 am - 2 am = 1.3, 2 am - 6 am = 1.4, 6 am - 3 pm = 1.8, 3 pm - 12 am = 1.55    [provider]  lansoprazole (PREVACID) 30 MG capsule Take 1 capsule (30 mg total) by mouth 2 (two) times daily before a meal. 12/13/20   Irene Shipper, MD  latanoprost (XALATAN) 0.005 % ophthalmic solution Place 1 drop into both eyes at bedtime. 12/26/17   [provider]  levocetirizine (XYZAL) 5 MG tablet Take 5 mg by mouth every morning.     [provider]  levothyroxine (SYNTHROID) 125 MCG tablet Take 125 mcg by mouth daily before breakfast.    [provider]  methotrexate (RHEUMATREX) 2.5 MG tablet Take 6  tablets (15 mg total) by mouth once a week. 3 tablets am and at bedtime twice a day once a week,  Saturday's Patient taking differently: Take 12.5 mg by mouth See admin instructions. Take 12.5 mg in the morning and 12.5 mg in the evening  on Saturdays 08/01/21   Aline August, MD  metoprolol succinate (TOPROL-XL) 50 MG 24 hr tablet Take 50 mg by mouth daily. Take with or immediately following a meal.    [provider]  montelukast (SINGULAIR) 10 MG tablet Take 10 mg by mouth at bedtime.    [provider]  NOVOLOG 100 UNIT/ML injection Inject into the skin See admin instructions. Pt has Insulin pump 08/06/18   [provider]  ondansetron (ZOFRAN) 4 MG tablet Take 1 tablet (4 mg total) by mouth every 6 (six) hours as needed for nausea. Patient not taking: Reported on 08/02/2021 07/25/21   Aline August, MD  pregabalin (LYRICA) 75 MG capsule Take 75-150 mg by mouth See admin instructions. Take 75 mg in the morning, 75 mg at lunch, and 150 mg at bedtime 07/24/18   [provider]  promethazine (PHENERGAN) 25 MG tablet Take 25 mg by mouth every 6 (six) hours as needed for nausea or vomiting.    [provider]  rosuvastatin (CRESTOR) 20 MG tablet Take 20 mg by mouth at bedtime.    [provider]  sertraline (ZOLOFT) 50 MG tablet Take 50 mg by mouth daily.    [provider]  Vitamin D, Ergocalciferol, (DRISDOL) 50000 UNITS CAPS Take 50,000 Units by mouth every Saturday.    [provider]    Physical Exam: Vitals:   08/13/21 2150 08/13/21 2322 08/14/21 0011 08/14/21 0103  BP:   (!) 130/58   Pulse:   (!) 114   Resp:   12   Temp:  (!) 100.7 F (38.2 C)  99.9 F (37.7 C)  TempSrc:  Oral  Oral  SpO2:   97%   Weight: 74.4 kg     Height: '4\' 11"'  (1.499 m)      Physical Exam Vitals and nursing note reviewed.  Constitutional:      General: She is not in acute distress.    Appearance: Normal appearance. She is ill-appearing.      Comments: Restless, appears uncomfortable  HENT:     Head: Normocephalic and atraumatic.  Cardiovascular:     Rate and Rhythm: Normal rate and regular rhythm.     Pulses: Normal pulses.     Heart sounds: Normal heart sounds. No murmur heard. Pulmonary:     Effort: Pulmonary effort is normal.     Breath sounds: Normal breath sounds. No wheezing or rhonchi.  Abdominal:     General: Bowel sounds are normal.     Palpations: Abdomen is soft.     Tenderness: There is no abdominal tenderness.  Musculoskeletal:        General: No swelling or tenderness. Normal range of motion.     Cervical back: Normal range of motion and neck supple.  Skin:    General: Skin is warm and dry.  Neurological:     General: No focal deficit present.     Mental Status: She is alert. She is disoriented.     Comments: Restless     Data Reviewed: Relevant notes from primary care and specialist visits, past discharge summaries as available in EHR, including Care Everywhere. Prior diagnostic testing as pertinent to current admission diagnoses Updated medications and problem lists for reconciliation ED course, including vitals, labs, imaging, treatment and response to treatment Triage notes, nursing and pharmacy notes and ED provider's notes Notable results as noted in HPI   Assessment and Plan: * Sepsis ([place]) Suspect secondary to E. coli UTI per recent culture versus  diarrhea onset 2 days prior Continue sepsis fluid bolus Treat UTI and diarrhea as outlined below  Complicated UTI (urinary tract infection) Recent culture grew E. coli sensitive to cephalosporins Continue cefepime given recent instrumentation Consider urology consult in the a.m.  Uncontrolled type 2 diabetes mellitus with hyperglycemia, with long-term current use of insulin (HCC) Sliding scale insulin coverage  Diarrhea GI panel and stool for C. difficile ordered  Hypothyroidism Continue levothyroxine  Essential  hypertension Continue home metoprolol  OSA on CPAP CPAP nightly       Advance Care Planning:   Code Status: Prior   Consults: none  Family Communication: son at bedside  Severity of Illness: The appropriate patient status for this patient is INPATIENT. Inpatient status is judged to be reasonable and necessary in order to provide the required intensity of service to ensure the patient's safety. The patient's presenting symptoms, physical exam findings, and initial radiographic and laboratory data in the context of their chronic comorbidities is felt to place them at high risk for further clinical deterioration. Furthermore, it is not anticipated that the patient will be medically stable for discharge from the hospital within 2 midnights of admission.   * I certify that at the point of admission it is my clinical judgment that the patient will require inpatient hospital care spanning beyond 2 midnights from the point of admission due to high intensity of service, high risk for further deterioration and high frequency of surveillance required.*  Author: Athena Masse, MD 08/14/2021 1:50 AM  For on call review [email or web address].

## 2021-08-15 DIAGNOSIS — N1330 Unspecified hydronephrosis: Secondary | ICD-10-CM | POA: Diagnosis not present

## 2021-08-15 DIAGNOSIS — G4733 Obstructive sleep apnea (adult) (pediatric): Secondary | ICD-10-CM

## 2021-08-15 DIAGNOSIS — I10 Essential (primary) hypertension: Secondary | ICD-10-CM | POA: Diagnosis not present

## 2021-08-15 DIAGNOSIS — A419 Sepsis, unspecified organism: Secondary | ICD-10-CM | POA: Diagnosis not present

## 2021-08-15 DIAGNOSIS — Z9889 Other specified postprocedural states: Secondary | ICD-10-CM | POA: Diagnosis not present

## 2021-08-15 DIAGNOSIS — N390 Urinary tract infection, site not specified: Secondary | ICD-10-CM | POA: Diagnosis not present

## 2021-08-15 DIAGNOSIS — I739 Peripheral vascular disease, unspecified: Secondary | ICD-10-CM

## 2021-08-15 DIAGNOSIS — J449 Chronic obstructive pulmonary disease, unspecified: Secondary | ICD-10-CM | POA: Diagnosis not present

## 2021-08-15 DIAGNOSIS — E1165 Type 2 diabetes mellitus with hyperglycemia: Secondary | ICD-10-CM

## 2021-08-15 DIAGNOSIS — N134 Hydroureter: Secondary | ICD-10-CM | POA: Diagnosis not present

## 2021-08-15 DIAGNOSIS — E669 Obesity, unspecified: Secondary | ICD-10-CM

## 2021-08-15 DIAGNOSIS — E039 Hypothyroidism, unspecified: Secondary | ICD-10-CM

## 2021-08-15 DIAGNOSIS — Z9989 Dependence on other enabling machines and devices: Secondary | ICD-10-CM

## 2021-08-15 DIAGNOSIS — Z794 Long term (current) use of insulin: Secondary | ICD-10-CM

## 2021-08-15 DIAGNOSIS — T8389XA Other specified complication of genitourinary prosthetic devices, implants and grafts, initial encounter: Secondary | ICD-10-CM | POA: Diagnosis not present

## 2021-08-15 LAB — GLUCOSE, CAPILLARY
Glucose-Capillary: 118 mg/dL — ABNORMAL HIGH (ref 70–99)
Glucose-Capillary: 142 mg/dL — ABNORMAL HIGH (ref 70–99)
Glucose-Capillary: 159 mg/dL — ABNORMAL HIGH (ref 70–99)
Glucose-Capillary: 161 mg/dL — ABNORMAL HIGH (ref 70–99)
Glucose-Capillary: 220 mg/dL — ABNORMAL HIGH (ref 70–99)
Glucose-Capillary: 223 mg/dL — ABNORMAL HIGH (ref 70–99)

## 2021-08-15 LAB — CBC
HCT: 30.2 % — ABNORMAL LOW (ref 36.0–46.0)
Hemoglobin: 9.7 g/dL — ABNORMAL LOW (ref 12.0–15.0)
MCH: 28.3 pg (ref 26.0–34.0)
MCHC: 32.1 g/dL (ref 30.0–36.0)
MCV: 88 fL (ref 80.0–100.0)
Platelets: 220 10*3/uL (ref 150–400)
RBC: 3.43 MIL/uL — ABNORMAL LOW (ref 3.87–5.11)
RDW: 14.3 % (ref 11.5–15.5)
WBC: 10.3 10*3/uL (ref 4.0–10.5)
nRBC: 0 % (ref 0.0–0.2)

## 2021-08-15 LAB — BASIC METABOLIC PANEL
Anion gap: 12 (ref 5–15)
BUN: 9 mg/dL (ref 6–20)
CO2: 24 mmol/L (ref 22–32)
Calcium: 8.6 mg/dL — ABNORMAL LOW (ref 8.9–10.3)
Chloride: 104 mmol/L (ref 98–111)
Creatinine, Ser: 0.86 mg/dL (ref 0.44–1.00)
GFR, Estimated: >60 mL/min (ref >60)
Glucose, Bld: 166 mg/dL — ABNORMAL HIGH (ref 70–99)
Potassium: 3.1 mmol/L — ABNORMAL LOW (ref 3.5–5.1)
Sodium: 140 mmol/L (ref 135–145)

## 2021-08-15 MED ORDER — POTASSIUM CHLORIDE IN NACL 20-0.45 MEQ/L-% IV SOLN
INTRAVENOUS | Status: DC
  Filled 2021-08-15 (×4): qty 1000

## 2021-08-15 MED ORDER — POTASSIUM CHLORIDE CRYS ER 20 MEQ PO TBCR
40.0000 meq | EXTENDED_RELEASE_TABLET | Freq: Once | ORAL | Status: AC
  Administered 2021-08-15: 40 meq via ORAL
  Filled 2021-08-15: qty 2

## 2021-08-15 NOTE — Progress Notes (Signed)
Urology Consult Follow Up  Subjective: Patient laying in bed comfortably.  She states that she feels great this morning.  VSS afebrile   WBC count down from 13.5 to 10.3 this am.  Serum creatinine down to 0.86 from 1.03   Good UOP  Urine culture preliminary status positive for E.coli.  Susceptibilities pending.   Anti-infectives: Anti-infectives (From admission, onward)    Start     Dose/Rate Route Frequency Ordered Stop   08/14/21 1800  cefTRIAXone (ROCEPHIN) 2 g in sodium chloride 0.9 % 100 mL IVPB        2 g 200 mL/hr over 30 Minutes Intravenous Every 24 hours 08/14/21 1000     08/14/21 0600  ceFEPIme (MAXIPIME) 2 g in sodium chloride 0.9 % 100 mL IVPB  Status:  Discontinued        2 g 200 mL/hr over 30 Minutes Intravenous Every 8 hours 08/14/21 0229 08/14/21 0959   08/13/21 2215  vancomycin (VANCOREADY) IVPB 1750 mg/350 mL        1,750 mg 175 mL/hr over 120 Minutes Intravenous  Once 08/13/21 2203 08/14/21 0130   08/13/21 2200  ceFEPIme (MAXIPIME) 2 g in sodium chloride 0.9 % 100 mL IVPB        2 g 200 mL/hr over 30 Minutes Intravenous  Once 08/13/21 2147 08/13/21 2307   08/13/21 2200  metroNIDAZOLE (FLAGYL) IVPB 500 mg        500 mg 100 mL/hr over 60 Minutes Intravenous  Once 08/13/21 2147 08/14/21 0104   08/13/21 2200  vancomycin (VANCOCIN) IVPB 1000 mg/200 mL premix  Status:  Discontinued        1,000 mg 200 mL/hr over 60 Minutes Intravenous  Once 08/13/21 2147 08/13/21 2203       Current Facility-Administered Medications  Medication Dose Route Frequency Provider Last Rate Last Admin   0.9 %  sodium chloride infusion   Intravenous Continuous Tamala Julian, Rondell A, MD 100 mL/hr at 08/14/21 2339 New Bag at 08/14/21 2339   acetaminophen (TYLENOL) tablet 650 mg  650 mg Oral Q6H PRN Athena Masse, MD   650 mg at 08/14/21 2254   Or   acetaminophen (TYLENOL) suppository 650 mg  650 mg Rectal Q6H PRN Athena Masse, MD       albuterol (PROVENTIL) (2.5 MG/3ML) 0.083% nebulizer  solution 2.5 mg  2.5 mg Nebulization Q4H PRN Judd Gaudier V, MD   2.5 mg at 08/14/21 2142   ALPRAZolam (XANAX) tablet 0.5-1 mg  0.5-1 mg Oral TID PRN Fuller Plan A, MD   1 mg at 08/14/21 2316   cefTRIAXone (ROCEPHIN) 2 g in sodium chloride 0.9 % 100 mL IVPB  2 g Intravenous Q24H Lockie Mola B, RPH 200 mL/hr at 08/14/21 2246 2 g at 08/14/21 2246   enoxaparin (LOVENOX) injection 37.5 mg  0.5 mg/kg Subcutaneous Q24H Judd Gaudier V, MD   37.5 mg at 08/15/21 0859   HYDROcodone-acetaminophen (NORCO/VICODIN) 5-325 MG per tablet 1 tablet  1 tablet Oral Q6H PRN Athena Masse, MD       insulin pump   Subcutaneous Q4H Patrecia Pour, MD   Given at 08/14/21 2334   levothyroxine (SYNTHROID) tablet 125 mcg  125 mcg Oral Q0600 Athena Masse, MD   125 mcg at 08/15/21 0618   metoprolol succinate (TOPROL-XL) 24 hr tablet 50 mg  50 mg Oral Daily Judd Gaudier V, MD   50 mg at 08/15/21 0859   morphine (PF) 2 MG/ML injection 2 mg  2 mg Intravenous Q2H PRN Athena Masse, MD   2 mg at 08/14/21 1904   ondansetron [facility]) tablet 4 mg  4 mg Oral Q6H PRN Athena Masse, MD       Or   ondansetron [facility]) injection 4 mg  4 mg Intravenous Q6H PRN Athena Masse, MD   4 mg at 08/14/21 1903   prochlorperazine (COMPAZINE) injection 10 mg  10 mg Intravenous Q6H PRN Fuller Plan A, MD   10 mg at 08/14/21 2323   rosuvastatin (CRESTOR) tablet 20 mg  20 mg Oral QHS Judd Gaudier V, MD   20 mg at 08/14/21 2247     Objective: Vital signs in last 24 hours: Temp:  [97.8 F (36.6 C)-99.6 F (37.6 C)] 98.2 F (36.8 C) (03/07 0826) Pulse Rate:  [79-115] 91 (03/07 0826) Resp:  [15-31] 18 (03/07 0826) BP: (126-168)/(60-99) 144/73 (03/07 0826) SpO2:  [93 %-99 %] 99 % (03/07 0826) Weight:  [74.3 kg] 74.3 kg (03/06 2100)  Intake/Output from previous day: 03/06 0701 - 03/07 0700 In: 2802.4 [I.V.:2602.4; IV Piggyback:200] Out: 3000 [Urine:3000] Intake/Output this shift: No intake/output data recorded.   Physical  Exam Constitutional:  Well nourished. Alert and oriented, No acute distress. HEENT: [place] AT, moist mucus membranes.  Trachea midline Cardiovascular: No clubbing, cyanosis, or edema. Respiratory: Normal respiratory effort, no increased work of breathing. GU: No CVA tenderness.  No bladder fullness or masses.   Neurologic: Grossly intact, no focal deficits, moving all 4 extremities. Psychiatric: Normal mood and affect.   Lab Results:  Recent Labs    08/14/21 0504 08/15/21 0513  WBC 13.5* 10.3  HGB 10.3* 9.7*  HCT 32.9* 30.2*  PLT 216 220   BMET Recent Labs    08/13/21 2200 08/14/21 0504 08/15/21 0513  NA 134*  --  140  K 3.3*  --  3.1*  CL 98  --  104  CO2 23  --  24  GLUCOSE 258*  --  166*  BUN 14  --  9  CREATININE 0.96 1.03* 0.86  CALCIUM 8.7*  --  8.6*   PT/INR Recent Labs    08/13/21 2217  LABPROT 14.2  INR 1.1   ABG No results for input(s): PHART, HCO3 in the last 72 hours.  Invalid input(s): PCO2, PO2  Studies/Results: CT ABDOMEN PELVIS W CONTRAST  Result Date: 08/13/2021 CLINICAL DATA:  Recent right kidney stent placement with fever and chills. EXAM: CT ABDOMEN AND PELVIS WITH CONTRAST TECHNIQUE: Multidetector CT imaging of the abdomen and pelvis was performed using the standard protocol following bolus administration of intravenous contrast. RADIATION DOSE REDUCTION: This exam was performed according to the departmental dose-optimization program which includes automated exposure control, adjustment of the mA and/or kV according to patient size and/or use of iterative reconstruction technique. CONTRAST:  150m OMNIPAQUE IOHEXOL 300 MG/ML  SOLN COMPARISON:  July 22, 2021 FINDINGS: Lower chest: Mild atelectasis is seen within the bilateral lung bases. Hepatobiliary: There is diffuse fatty infiltration of the liver parenchyma. No focal liver abnormality is seen. No gallstones, gallbladder wall thickening, or biliary dilatation. Pancreas: Unremarkable. No  pancreatic ductal dilatation or surrounding inflammatory changes. Spleen: Normal in size without focal abnormality. Adrenals/Urinary Tract: Adrenal glands are unremarkable. The kidneys are normal in size. A subcentimeter cystic appearing area is seen within the mid left kidney. Bilateral subcentimeter nonobstructing calculi are present. There is mild right-sided hydronephrosis with moderate severity right-sided hydroureter to the level proximal to the right UVJ. The obstructing right  ureteral calculi seen within the distal right ureter on the prior study are no longer visualized. Mild, stable, bilateral perinephric inflammatory fat stranding is noted. The urinary bladder is moderately distended and otherwise unremarkable. Stomach/Bowel: Stomach is within normal limits. Appendix appears normal. No evidence of bowel wall thickening, distention, or inflammatory changes. Vascular/Lymphatic: Aortic atherosclerosis with bilateral aortoiliac stents. No enlarged abdominal or pelvic lymph nodes. Reproductive: Status post hysterectomy. No adnexal masses. Other: No abdominal wall hernia or abnormality. No abdominopelvic ascites. Musculoskeletal: No acute or significant osseous findings. IMPRESSION: 1. Interval passage of the distal right ureteral calculi seen on the prior study with residual right-sided hydronephrosis and hydroureter. 2. Bilateral subcentimeter nonobstructing renal calculi. 3. Hepatic steatosis. Aortic Atherosclerosis (ICD10-I70.0). Electronically Signed   By: Virgina Norfolk M.D.   On: 08/13/2021 23:58   DG Chest Port 1 View  Result Date: 08/13/2021 CLINICAL DATA:  Questionable sepsis. EXAM: PORTABLE CHEST 1 VIEW COMPARISON:  Radiograph 07/22/2021 FINDINGS: The cardiomediastinal contours are normal. Mild chronic bronchial thickening. Pulmonary vasculature is normal. No consolidation, pleural effusion, or pneumothorax. No acute osseous abnormalities are seen. IMPRESSION: Mild chronic bronchial thickening  without acute abnormality. Electronically Signed   By: Keith Rake M.D.   On: 08/13/2021 22:09     Assessment: 56 year old female who accidentally removed her stent on 08/13/2021 which was 2 days prior to the instructed date of August 15, 2021 who presented to the ED with nausea, vomiting and pain.  Her lactate was normal and her WBC count was 13,000.  UA 11-20 RBCs and 6-10 WBCs.  This CT scan with contrast noted moderate hydronephrosis and hydroureter with no residual stone fragments identified.  She is admitted for IV fluids, IV antibiotics and observation.    Plan: -continue broad-spectrum antibiotics pending urine sensitivity results -WBC count and serum creatinine trending downward, so ureteral edema is likely resolving  -okay for diet -okay for discharge from urological stand point     LOS: 1 day    [facility] 08/15/2021

## 2021-08-15 NOTE — Progress Notes (Signed)
Progress Note  Patient: Ashley [name] ZOX:[id number] DOB: [birth year]  DOA: 08/13/2021  DOS: 08/15/2021    Brief hospital course: Ashley [name] is a 56 y.o. female with a history of T2DM on insulin pump, HTN, OSA on CPAP, recent admission for E. coli bacteremia, urosepsis with right ureterolithiasis s/p emergent stent placement 2/11, given ceftriaxone, then discharged on keflex, subsequently undergoing laser lithotripsy and extraction 3/3. She inadvertently removed the stent 3/5 and presented to the ED with fever, leukocytosis found to have pyuria, hematuria, and moderate right hydronephrosis/hydroureter on CT abd/pelvis. Creatinine at recent baseline 0.9-1.0. IV fluids and broad IV antibiotics were given for sepsis with presumably urinary source. She also complained of loose stools for which stool studies were ordered, but later cancelled as diarrhea has resolved.   Assessment and Plan: * Sepsis ([place]) Suspect secondary to E. coli UTI per recent culture versus diarrhea  - s/p IVF. Hemodynamically stabilizing - Continue antibiotics and work up as below. - Note recent E. coli bacteremia with similar presentation. Blood cultures are NGTD at < 48 hours at this point. Will allow growth for 48 hours at least and knowing susceptibilities of E. coli on urine Cx prior to discharge.  Complicated UTI (urinary tract infection) Suspected etiology of sepsis, recent blood and urine cultures grew E. coli resistant only to ampicillin and amp/sulbactam.  - Continue ceftriaxone as she's shown improvement on this.  - Urology consulted for recommendations regarding premature stent removal and moderate hydroureteronephrosis. Suspect ureteral edema which is showing improvement as evidenced by good UOP, improving creatinine.  Uncontrolled type 2 diabetes mellitus with hyperglycemia, with long-term current use of insulin ([name]) - Restart pt's insulin pump. DM coordinator consulted. - Also on ASA, statin  Diarrhea -  GI panel and stool for C. difficile ordered. Still not yet collected. Abd exam currently without peritonitis.  Obesity (BMI 30-39.9) Body mass index is 33.12 kg/m.   COPD (chronic obstructive pulmonary disease) (HCC) 20 pack year history, followed by [facility]. Also had ARDS due to covid-19 pneumonia Jan 2022 requiring intubation and protracted hypoxic respiratory failure that has since resolved. - Monitor for hypoxia  Hypothyroidism - Continue levothyroxine  Peripheral arterial disease ([place]) Has had LE angioplasty by Dr. Gwenlyn Found. No current complaints.  - Continue ASA, statin  Essential hypertension BP is elevated, lactic acid wnl.  - Continue home metoprolol  OSA on CPAP - CPAP qHS.   Subjective: Feels tired with some bilateral lower back pain attributed by pt to stretcher she's laying on all night. No confusion per pt or son. No dyspnea or chest pain. No diarrhea since arrival, but was having 8 episodes of loose stool in 24 hour periods for 2 days.   Objective: Vitals:   08/14/21 2142 08/14/21 2250 08/15/21 0412 08/15/21 0826  BP:   126/66 (!) 144/73  Pulse:   79 91  Resp:   20 18  Temp:  99.3 F (37.4 C) 97.8 F (36.6 C) 98.2 F (36.8 C)  TempSrc:  Oral Oral   SpO2: 98%  97% 99%  Weight:      Height:       Gen: 56 y.o. female in no distress Pulm: Nonlabored breathing room air. Clear CV: Regular tachycardia without murmur, rub, or gallop. No JVD, no pitting dependent edema. GI: Abdomen soft, minimally diffusely tender, but non-distended, with normoactive bowel sounds. No CVA tenderness to percussion. Ext: Warm, no deformities Skin: No rashes, lesions or ulcers on visualized skin. Neuro: Alert and oriented. No  focal neurological deficits. Psych: Judgement and insight appear fair. Mood euthymic & affect congruent. Behavior is appropriate.    Data Personally reviewed: CBC: Recent Labs  Lab 08/13/21 2200 08/14/21 0504 08/15/21 0513  WBC 13.4* 13.5* 10.3   NEUTROABS 11.0*  --   --   HGB 11.3* 10.3* 9.7*  HCT 35.3* 32.9* 30.2*  MCV 90.1 89.4 88.0  PLT 227 216 992   Basic Metabolic Panel: Recent Labs  Lab 08/13/21 2200 08/14/21 0504 08/15/21 0513  NA 134*  --  140  K 3.3*  --  3.1*  CL 98  --  104  CO2 23  --  24  GLUCOSE 258*  --  166*  BUN 14  --  9  CREATININE 0.96 1.03* 0.86  CALCIUM 8.7*  --  8.6*   GFR: Estimated Creatinine Clearance: 64.1 mL/min (by C-G formula based on SCr of 0.86 mg/dL). Liver Function Tests: Recent Labs  Lab 08/13/21 2200  AST 18  ALT 20  ALKPHOS 91  BILITOT 0.8  PROT 6.8  ALBUMIN 3.4*   No results for input(s): LIPASE, AMYLASE in the last 168 hours. No results for input(s): AMMONIA in the last 168 hours. Coagulation Profile: Recent Labs  Lab 08/13/21 2217  INR 1.1   Cardiac Enzymes: No results for input(s): CKTOTAL, CKMB, CKMBINDEX, TROPONINI in the last 168 hours. BNP (last 3 results) No results for input(s): PROBNP in the last 8760 hours. HbA1C: Recent Labs    08/14/21 0504  HGBA1C 8.2*   CBG: Recent Labs  Lab 08/14/21 2052 08/14/21 2332 08/15/21 0409 08/15/21 0823 08/15/21 1135  GLUCAP 176* 220* 161* 142* 118*   Lipid Profile: No results for input(s): CHOL, HDL, LDLCALC, TRIG, CHOLHDL, LDLDIRECT in the last 72 hours. Thyroid Function Tests: No results for input(s): TSH, T4TOTAL, FREET4, T3FREE, THYROIDAB in the last 72 hours. Anemia Panel: No results for input(s): VITAMINB12, FOLATE, FERRITIN, TIBC, IRON, RETICCTPCT in the last 72 hours. Urine analysis:    Component Value Date/Time   COLORURINE YELLOW (A) 08/13/2021 2217   APPEARANCEUR CLEAR (A) 08/13/2021 2217   LABSPEC 1.011 08/13/2021 2217   PHURINE 7.0 08/13/2021 2217   GLUCOSEU 150 (A) 08/13/2021 2217   HGBUR MODERATE (A) 08/13/2021 2217   BILIRUBINUR NEGATIVE 08/13/2021 2217   KETONESUR 20 (A) 08/13/2021 2217   PROTEINUR 100 (A) 08/13/2021 2217   UROBILINOGEN 0.2 01/11/2010 1909   NITRITE NEGATIVE  08/13/2021 2217   LEUKOCYTESUR NEGATIVE 08/13/2021 2217   Recent Results (from the past 240 hour(s))  Resp Panel by RT-PCR (Flu A&B, Covid) Nasopharyngeal Swab     Status: None   Collection Time: 08/13/21 10:17 PM   Specimen: Nasopharyngeal Swab; Nasopharyngeal(NP) swabs in vial transport medium  Result Value Ref Range Status   SARS Coronavirus 2 by RT PCR NEGATIVE NEGATIVE Final    Comment: (NOTE) SARS-CoV-2 target nucleic acids are NOT DETECTED.  The SARS-CoV-2 RNA is generally detectable in upper respiratory specimens during the acute phase of infection. The lowest concentration of SARS-CoV-2 viral copies this assay can detect is 138 copies/mL. A negative result does not preclude SARS-Cov-2 infection and should not be used as the sole basis for treatment or other patient management decisions. A negative result may occur with  improper specimen collection/handling, submission of specimen other than nasopharyngeal swab, presence of viral mutation(s) within the areas targeted by this assay, and inadequate number of viral copies(<138 copies/mL). A negative result must be combined with clinical observations, patient history, and epidemiological information. The expected  result is Negative.  Fact Sheet for Patients:  EntrepreneurPulse.com.au  Fact Sheet for Healthcare Providers:  IncredibleEmployment.be  This test is no t yet approved or cleared by the [place] FDA and  has been authorized for detection and/or diagnosis of SARS-CoV-2 by FDA under an Emergency Use Authorization (EUA). This EUA will remain  in effect (meaning this test can be used) for the duration of the COVID-19 declaration under Section 564(b)(1) of the Act, 21 U.S.C.section 360bbb-3(b)(1), unless the authorization is terminated  or revoked sooner.       Influenza A by PCR NEGATIVE NEGATIVE Final   Influenza B by PCR NEGATIVE NEGATIVE Final    Comment: (NOTE) The Xpert  Xpress SARS-CoV-2/FLU/RSV plus assay is intended as an aid in the diagnosis of influenza from Nasopharyngeal swab specimens and should not be used as a sole basis for treatment. Nasal washings and aspirates are unacceptable for Xpert Xpress SARS-CoV-2/FLU/RSV testing.  Fact Sheet for Patients: EntrepreneurPulse.com.au  Fact Sheet for Healthcare Providers: IncredibleEmployment.be  This test is not yet approved or cleared by the [place] FDA and has been authorized for detection and/or diagnosis of SARS-CoV-2 by FDA under an Emergency Use Authorization (EUA). This EUA will remain in effect (meaning this test can be used) for the duration of the COVID-19 declaration under Section 564(b)(1) of the Act, 21 U.S.C. section 360bbb-3(b)(1), unless the authorization is terminated or revoked.  Performed at [facility], [address] [place], [place] [address]   Blood Culture (routine x 2)     Status: None (Preliminary result)   Collection Time: 08/13/21 10:17 PM   Specimen: BLOOD  Result Value Ref Range Status   Specimen Description BLOOD RIGHT ANTECUBITAL  Final   Special Requests   Final    BOTTLES DRAWN AEROBIC AND ANAEROBIC Blood Culture results may not be optimal due to an inadequate volume of blood received in culture bottles   Culture   Final    NO GROWTH 2 DAYS Performed at [facility], [address] [place], [place] [address]    Report Status PENDING  Incomplete  Urine Culture     Status: Abnormal (Preliminary result)   Collection Time: 08/13/21 10:17 PM   Specimen: In/Out Cath Urine  Result Value Ref Range Status   Specimen Description   Final    IN/OUT CATH URINE Performed at [facility], [address] [place], [place] [address]    Special Requests   Final    NONE Performed at [facility], [address], [place], [place] [address]    Culture (A)  Final    >=100,000 COLONIES/mL  ESCHERICHIA COLI SUSCEPTIBILITIES TO FOLLOW Performed at [facility], [place] [address], [place], [place] [address]    Report Status PENDING  Incomplete  Culture, blood (Routine X 2) w Reflex to ID Panel     Status: None (Preliminary result)   Collection Time: 08/14/21  1:32 AM   Specimen: BLOOD  Result Value Ref Range Status   Specimen Description BLOOD BLOOD RIGHT HAND  Final   Special Requests   Final    BOTTLES DRAWN AEROBIC AND ANAEROBIC Blood Culture adequate volume   Culture   Final    NO GROWTH 1 DAY Performed at [facility], [address] [place], [place] [address]    Report Status PENDING  Incomplete     CT ABDOMEN PELVIS W CONTRAST  Result Date: 08/13/2021 CLINICAL DATA:  Recent right kidney stent placement with  fever and chills. EXAM: CT ABDOMEN AND PELVIS WITH CONTRAST TECHNIQUE: Multidetector CT imaging of the abdomen and pelvis was performed using the standard protocol following bolus administration of intravenous contrast. RADIATION DOSE REDUCTION: This exam was performed according to the departmental dose-optimization program which includes automated exposure control, adjustment of the mA and/or kV according to patient size and/or use of iterative reconstruction technique. CONTRAST:  144m OMNIPAQUE IOHEXOL 300 MG/ML  SOLN COMPARISON:  July 22, 2021 FINDINGS: Lower chest: Mild atelectasis is seen within the bilateral lung bases. Hepatobiliary: There is diffuse fatty infiltration of the liver parenchyma. No focal liver abnormality is seen. No gallstones, gallbladder wall thickening, or biliary dilatation. Pancreas: Unremarkable. No pancreatic ductal dilatation or surrounding inflammatory changes. Spleen: Normal in size without focal abnormality. Adrenals/Urinary Tract: Adrenal glands are unremarkable. The kidneys are normal in size. A subcentimeter cystic appearing area is seen within the mid left kidney. Bilateral subcentimeter nonobstructing calculi are  present. There is mild right-sided hydronephrosis with moderate severity right-sided hydroureter to the level proximal to the right UVJ. The obstructing right ureteral calculi seen within the distal right ureter on the prior study are no longer visualized. Mild, stable, bilateral perinephric inflammatory fat stranding is noted. The urinary bladder is moderately distended and otherwise unremarkable. Stomach/Bowel: Stomach is within normal limits. Appendix appears normal. No evidence of bowel wall thickening, distention, or inflammatory changes. Vascular/Lymphatic: Aortic atherosclerosis with bilateral aortoiliac stents. No enlarged abdominal or pelvic lymph nodes. Reproductive: Status post hysterectomy. No adnexal masses. Other: No abdominal wall hernia or abnormality. No abdominopelvic ascites. Musculoskeletal: No acute or significant osseous findings. IMPRESSION: 1. Interval passage of the distal right ureteral calculi seen on the prior study with residual right-sided hydronephrosis and hydroureter. 2. Bilateral subcentimeter nonobstructing renal calculi. 3. Hepatic steatosis. Aortic Atherosclerosis (ICD10-I70.0). Electronically Signed   By: TVirgina NorfolkM.D.   On: 08/13/2021 23:58   DG Chest Port 1 View  Result Date: 08/13/2021 CLINICAL DATA:  Questionable sepsis. EXAM: PORTABLE CHEST 1 VIEW COMPARISON:  Radiograph 07/22/2021 FINDINGS: The cardiomediastinal contours are normal. Mild chronic bronchial thickening. Pulmonary vasculature is normal. No consolidation, pleural effusion, or pneumothorax. No acute osseous abnormalities are seen. IMPRESSION: Mild chronic bronchial thickening without acute abnormality. Electronically Signed   By: MKeith RakeM.D.   On: 08/13/2021 22:09    SARS-CoV-2 PCR: Negative Influenza A/B: Negative  Family Communication: None at bedside  Disposition: Status is: Inpatient Remains inpatient appropriate because: Sepsis, IV abx Planned Discharge Destination:  Home  RPatrecia Pour MD 08/15/2021 1:27 PM Page by aShea Evanscom

## 2021-08-15 NOTE — TOC Initial Note (Signed)
Transition of Care (TOC) - Initial/Assessment Note  ? ? ?Patient Details  ?Name: Ashley [name] ?MRN: [id number] ?Date of Birth: 1965-12-08 ? ?Transition of Care (TOC) CM/SW Contact:    ?Beverly Sessions, RN ?Phone Number: ?08/15/2021, 11:42 AM ? ?Clinical Narrative:                 ? ?Patient admitted with sepsis.  ?Patient with high risk for readmission.  Patient was assessed by [facility] 2/13.  See note from that admission below ? ?"CSW spoke with patient to complete high risk assessment.  ?Patient lives alone and drives herself to appointments. PCP is Dr. Dagmar Hait. Pharmacy [facility] in [place]. Patient has a CPAP at home. Had [facility] in the past. Patient denies TOC needs at this time and agreed to reach out if needs arise. " ? ?Please consult TOC if needs arise  ?  ?  ? ? ?Patient Goals and CMS Choice ?  ?  ?  ? ?Expected Discharge Plan and Services ?  ?  ?  ?  ?  ?                ?  ?  ?  ?  ?  ?  ?  ?  ?  ?  ? ?Prior Living Arrangements/Services ?  ?  ?  ?       ?  ?  ?  ?  ? ?Activities of Daily Living ?Home Assistive Devices/Equipment: Eyeglasses, Bedside commode/3-in-1, Built-in shower seat, Insulin Pump, Dentures (specify type), Shower chair without back ?ADL Screening (condition at time of admission) ?Patient's cognitive ability adequate to safely complete daily activities?: Yes ?Is the patient deaf or have difficulty hearing?: No ?Does the patient have difficulty seeing, even when wearing glasses/contacts?: No ?Does the patient have difficulty concentrating, remembering, or making decisions?: No ?Patient able to express need for assistance with ADLs?: Yes ?Does the patient have difficulty dressing or bathing?: No ?Independently performs ADLs?: Yes (appropriate for developmental age) ?Does the patient have difficulty walking or climbing stairs?: No ?Weakness of Legs: None ?Weakness of Arms/Hands: None ? ?Permission Sought/Granted ?  ?  ?   ?   ?   ?   ? ?Emotional Assessment ?  ?  ?  ?  ?  ?   ? ?Admission diagnosis:  Sepsis secondary to UTI ([place]) [A41.9, N39.0] ?Sepsis ([place]) [A41.9] ?Patient Active Problem List  ? Diagnosis Date Noted  ? Sepsis ([place]) 08/14/2021  ? Complicated UTI (urinary tract infection) 08/14/2021  ? Obesity (BMI 30-39.9) 08/14/2021  ? Hydronephrosis   ? Sepsis secondary to UTI ([place]) 07/23/2021  ? Acute pyelonephritis 07/23/2021  ? Hydronephrosis, right 07/23/2021  ? Right ureteral stone 07/23/2021  ? E coli bacteremia 07/23/2021  ? Leukocytosis 07/23/2021  ? AKI (acute kidney injury) ([place]) 07/23/2021  ? Uncontrolled type 2 diabetes mellitus with hyperglycemia, with long-term current use of insulin ([place]) 07/23/2021  ? COPD (chronic obstructive pulmonary disease) ([place]) 07/23/2021  ? Obstructive uropathy 07/22/2021  ? Endotracheal tube present   ? DKA (diabetic ketoacidosis) ([place]) 06/21/2020  ? Rheumatoid arthritis ([place]) 06/21/2020  ? Pneumonia due to COVID-19 virus 06/21/2020  ? Tobacco abuse 02/24/2020  ? Claudication in peripheral vascular disease ([place]) 01/01/2019  ? Essential hypertension 12/25/2018  ? Hyperlipidemia 12/25/2018  ? Peripheral arterial disease ([place]) 12/25/2018  ? Hypothyroidism 12/09/2018  ? Hypertensive disorder 12/09/2018  ? OSA on CPAP 10/08/2018  ? Loud snoring 05/19/2018  ? OSA and  COPD overlap syndrome ([place]) 04/02/2018  ? Excessive daytime sleepiness 04/02/2018  ? Delayed sleep phase syndrome 04/02/2018  ? Moderate persistent asthma 04/02/2018  ? Dyspnea and respiratory abnormality 04/26/2015  ? Chest tightness 04/26/2015  ? Hoarseness of voice 04/26/2015  ? Nausea with vomiting 10/22/2012  ? Diarrhea 10/22/2012  ? Gastroparesis 03/22/2009  ? DYSPHAGIA 03/22/2009  ? DIAB W/O MENTION COMP TYPE I [JUV TYPE] UNCNTRL 02/28/2009  ? GERD 02/28/2009  ? DYSPHAGIA UNSPECIFIED 02/28/2009  ? ABDOMINAL PAIN-EPIGASTRIC 02/28/2009  ? ?PCP:  Prince Solian, MD ?Pharmacy:   ?[id number] [id number]?Phone: [phone]: [phone]? ?[id number]?Phone: [phone]: [phone]? ? ? ? ?Social Determinants of Health (SDOH) Interventions ?  ? ?Readmission Risk Interventions ?Readmission Risk Prevention Plan 07/23/2021  ?Transportation Screening Complete  ?PCP or Specialist Appt within 3-5 Days Complete  ?HDavidsonor Home Care Consult Complete  ?Social Work Consult for RP[place]anning/Counseling Complete  ?Palliative Care Screening Not Applicable  ?Medication Review (Press photographer Complete  ?Some recent data might be hidden  ? ? ? ?

## 2021-08-15 NOTE — Progress Notes (Incomplete)
Urology Consult Follow Up  Subjective: Patient resting in bed comfortably.  VSS afebrile    Anti-infectives: Anti-infectives (From admission, onward)    Start     Dose/Rate Route Frequency Ordered Stop   08/14/21 1800  cefTRIAXone (ROCEPHIN) 2 g in sodium chloride 0.9 % 100 mL IVPB        2 g 200 mL/hr over 30 Minutes Intravenous Every 24 hours 08/14/21 1000     08/14/21 0600  ceFEPIme (MAXIPIME) 2 g in sodium chloride 0.9 % 100 mL IVPB  Status:  Discontinued        2 g 200 mL/hr over 30 Minutes Intravenous Every 8 hours 08/14/21 0229 08/14/21 0959   08/13/21 2215  vancomycin (VANCOREADY) IVPB 1750 mg/350 mL        1,750 mg 175 mL/hr over 120 Minutes Intravenous  Once 08/13/21 2203 08/14/21 0130   08/13/21 2200  ceFEPIme (MAXIPIME) 2 g in sodium chloride 0.9 % 100 mL IVPB        2 g 200 mL/hr over 30 Minutes Intravenous  Once 08/13/21 2147 08/13/21 2307   08/13/21 2200  metroNIDAZOLE (FLAGYL) IVPB 500 mg        500 mg 100 mL/hr over 60 Minutes Intravenous  Once 08/13/21 2147 08/14/21 0104   08/13/21 2200  vancomycin (VANCOCIN) IVPB 1000 mg/200 mL premix  Status:  Discontinued        1,000 mg 200 mL/hr over 60 Minutes Intravenous  Once 08/13/21 2147 08/13/21 2203       Current Facility-Administered Medications  Medication Dose Route Frequency Provider Last Rate Last Admin   0.9 %  sodium chloride infusion   Intravenous Continuous Tamala Julian, Rondell A, MD 100 mL/hr at 08/14/21 2339 New Bag at 08/14/21 2339   acetaminophen (TYLENOL) tablet 650 mg  650 mg Oral Q6H PRN Athena Masse, MD   650 mg at 08/14/21 2254   Or   acetaminophen (TYLENOL) suppository 650 mg  650 mg Rectal Q6H PRN Athena Masse, MD       albuterol (PROVENTIL) (2.5 MG/3ML) 0.083% nebulizer solution 2.5 mg  2.5 mg Nebulization Q4H PRN Judd Gaudier V, MD   2.5 mg at 08/14/21 2142   ALPRAZolam (XANAX) tablet 0.5-1 mg  0.5-1 mg Oral TID PRN Fuller Plan A, MD   1 mg at 08/14/21 2316   cefTRIAXone  (ROCEPHIN) 2 g in sodium chloride 0.9 % 100 mL IVPB  2 g Intravenous Q24H Lockie Mola B, RPH 200 mL/hr at 08/14/21 2246 2 g at 08/14/21 2246   enoxaparin (LOVENOX) injection 37.5 mg  0.5 mg/kg Subcutaneous Q24H Judd Gaudier V, MD   37.5 mg at 08/15/21 0859   HYDROcodone-acetaminophen (NORCO/VICODIN) 5-325 MG per tablet 1 tablet  1 tablet Oral Q6H PRN Athena Masse, MD       insulin pump   Subcutaneous Q4H Patrecia Pour, MD   Given at 08/14/21 2334   levothyroxine (SYNTHROID) tablet 125 mcg  125 mcg Oral Q0600 Athena Masse, MD   125 mcg at 08/15/21 0618   metoprolol succinate (TOPROL-XL) 24 hr tablet 50 mg  50 mg Oral Daily Judd Gaudier V, MD   50 mg at 08/15/21 0859   morphine (PF) 2 MG/ML injection 2 mg  2 mg Intravenous Q2H PRN Athena Masse, MD   2 mg at 08/14/21 1904   ondansetron (ZOFRAN) tablet 4 mg  4 mg Oral Q6H PRN Athena Masse, MD       Or  ondansetron (ZOFRAN) injection 4 mg  4 mg Intravenous Q6H PRN Athena Masse, MD   4 mg at 08/14/21 1903   prochlorperazine (COMPAZINE) injection 10 mg  10 mg Intravenous Q6H PRN Fuller Plan A, MD   10 mg at 08/14/21 2323   rosuvastatin (CRESTOR) tablet 20 mg  20 mg Oral QHS Judd Gaudier V, MD   20 mg at 08/14/21 2247     Objective: Vital signs in last 24 hours: Temp:  [97.8 F (36.6 C)-99.6 F (37.6 C)] 98.2 F (36.8 C) (03/07 0826) Pulse Rate:  [79-115] 91 (03/07 0826) Resp:  [15-31] 18 (03/07 0826) BP: (126-168)/(60-99) 144/73 (03/07 0826) SpO2:  [93 %-99 %] 99 % (03/07 0826) Weight:  [74.3 kg] 74.3 kg (03/06 2100)  Intake/Output from previous day: 03/06 0701 - 03/07 0700 In: 2802.4 [I.V.:2602.4; IV Piggyback:200] Out: 3000 [Urine:3000] Intake/Output this shift: No intake/output data recorded.   Physical Exam  Lab Results:  Recent Labs    08/14/21 0504 08/15/21 0513  WBC 13.5* 10.3  HGB 10.3* 9.7*  HCT 32.9* 30.2*  PLT 216 220   BMET Recent Labs    08/13/21 2200 08/14/21 0504  08/15/21 0513  NA 134*  --  140  K 3.3*  --  3.1*  CL 98  --  104  CO2 23  --  24  GLUCOSE 258*  --  166*  BUN 14  --  9  CREATININE 0.96 1.03* 0.86  CALCIUM 8.7*  --  8.6*   PT/INR Recent Labs    08/13/21 2217  LABPROT 14.2  INR 1.1   ABG No results for input(s): PHART, HCO3 in the last 72 hours.  Invalid input(s): PCO2, PO2  Studies/Results: CT ABDOMEN PELVIS W CONTRAST  Result Date: 08/13/2021 CLINICAL DATA:  Recent right kidney stent placement with fever and chills. EXAM: CT ABDOMEN AND PELVIS WITH CONTRAST TECHNIQUE: Multidetector CT imaging of the abdomen and pelvis was performed using the standard protocol following bolus administration of intravenous contrast. RADIATION DOSE REDUCTION: This exam was performed according to the departmental dose-optimization program which includes automated exposure control, adjustment of the mA and/or kV according to patient size and/or use of iterative reconstruction technique. CONTRAST:  121m OMNIPAQUE IOHEXOL 300 MG/ML  SOLN COMPARISON:  July 22, 2021 FINDINGS: Lower chest: Mild atelectasis is seen within the bilateral lung bases. Hepatobiliary: There is diffuse fatty infiltration of the liver parenchyma. No focal liver abnormality is seen. No gallstones, gallbladder wall thickening, or biliary dilatation. Pancreas: Unremarkable. No pancreatic ductal dilatation or surrounding inflammatory changes. Spleen: Normal in size without focal abnormality. Adrenals/Urinary Tract: Adrenal glands are unremarkable. The kidneys are normal in size. A subcentimeter cystic appearing area is seen within the mid left kidney. Bilateral subcentimeter nonobstructing calculi are present. There is mild right-sided hydronephrosis with moderate severity right-sided hydroureter to the level proximal to the right UVJ. The obstructing right ureteral calculi seen within the distal right ureter on the prior study are no longer visualized. Mild, stable, bilateral perinephric  inflammatory fat stranding is noted. The urinary bladder is moderately distended and otherwise unremarkable. Stomach/Bowel: Stomach is within normal limits. Appendix appears normal. No evidence of bowel wall thickening, distention, or inflammatory changes. Vascular/Lymphatic: Aortic atherosclerosis with bilateral aortoiliac stents. No enlarged abdominal or pelvic lymph nodes. Reproductive: Status post hysterectomy. No adnexal masses. Other: No abdominal wall hernia or abnormality. No abdominopelvic ascites. Musculoskeletal: No acute or significant osseous findings. IMPRESSION: 1. Interval passage of the distal right ureteral calculi seen on the  prior study with residual right-sided hydronephrosis and hydroureter. 2. Bilateral subcentimeter nonobstructing renal calculi. 3. Hepatic steatosis. Aortic Atherosclerosis (ICD10-I70.0). Electronically Signed   By: Virgina Norfolk M.D.   On: 08/13/2021 23:58   DG Chest Port 1 View  Result Date: 08/13/2021 CLINICAL DATA:  Questionable sepsis. EXAM: PORTABLE CHEST 1 VIEW COMPARISON:  Radiograph 07/22/2021 FINDINGS: The cardiomediastinal contours are normal. Mild chronic bronchial thickening. Pulmonary vasculature is normal. No consolidation, pleural effusion, or pneumothorax. No acute osseous abnormalities are seen. IMPRESSION: Mild chronic bronchial thickening without acute abnormality. Electronically Signed   By: Keith Rake M.D.   On: 08/13/2021 22:09     Assessment: s/p   Plan: {[id number]}  CC: ***    LOS: 1 day    [facility] 08/15/2021

## 2021-08-15 NOTE — Plan of Care (Signed)
Pt very anxious at the beginning of the shift. Also of note, c/o nausea and pt's HR was in the 100s and with activity climbing up to low 120s. Provider made aware, pt's home xanax dose ordered and administered per order along with NS '@100ml'$  hr until 11AM this morning. Pt was able to rest well after this. No c/o pain overnight.  ? ?Pt has been on enteric precaution for rule out C-diff and GPP but no BM since prior to admission. Pt states, she is not having any diarrhea since coming to the hospital.  ? ?Will ask day shift RN to discuss the need for precaution with primary team.  ?

## 2021-08-16 DIAGNOSIS — E876 Hypokalemia: Secondary | ICD-10-CM | POA: Diagnosis not present

## 2021-08-16 DIAGNOSIS — A419 Sepsis, unspecified organism: Secondary | ICD-10-CM | POA: Diagnosis not present

## 2021-08-16 DIAGNOSIS — N390 Urinary tract infection, site not specified: Secondary | ICD-10-CM | POA: Diagnosis not present

## 2021-08-16 LAB — GLUCOSE, CAPILLARY
Glucose-Capillary: 257 mg/dL — ABNORMAL HIGH (ref 70–99)
Glucose-Capillary: 148 mg/dL — ABNORMAL HIGH (ref 70–99)

## 2021-08-16 LAB — BASIC METABOLIC PANEL
Anion gap: 12 (ref 5–15)
BUN: 7 mg/dL (ref 6–20)
CO2: 21 mmol/L — ABNORMAL LOW (ref 22–32)
Calcium: 8.6 mg/dL — ABNORMAL LOW (ref 8.9–10.3)
Chloride: 104 mmol/L (ref 98–111)
Creatinine, Ser: 0.89 mg/dL (ref 0.44–1.00)
GFR, Estimated: >60 mL/min (ref >60)
Glucose, Bld: 193 mg/dL — ABNORMAL HIGH (ref 70–99)
Potassium: 2.9 mmol/L — ABNORMAL LOW (ref 3.5–5.1)
Sodium: 137 mmol/L (ref 135–145)

## 2021-08-16 LAB — URINE CULTURE: Culture: >=100000 — AB

## 2021-08-16 LAB — MAGNESIUM: Magnesium: 1.9 mg/dL (ref 1.7–2.4)

## 2021-08-16 MED ORDER — CIPROFLOXACIN HCL 500 MG PO TABS: 500.0000 mg | ORAL_TABLET | Freq: Two times a day (BID) | ORAL | 0 refills | Status: AC

## 2021-08-16 MED ORDER — POTASSIUM CHLORIDE 10 MEQ/100ML IV SOLN: 10.0000 meq | INTRAVENOUS | Status: DC

## 2021-08-16 NOTE — Hospital Course (Signed)
Ashley [name] is a 56 y.o. female with a history of T2DM on insulin pump, HTN, OSA on CPAP, recent admission for E. coli bacteremia, urosepsis with right ureterolithiasis s/p emergent stent placement 2/11, given ceftriaxone, then discharged on keflex, subsequently undergoing laser lithotripsy and extraction 3/3. She inadvertently removed the stent 3/5 and presented to the ED with fever, leukocytosis found to have pyuria, hematuria, and moderate right hydronephrosis/hydroureter on CT abd/pelvis. Creatinine at recent baseline 0.9-1.0. IV fluids and broad IV antibiotics were given for sepsis with presumably urinary source. She also complained of loose stools for which stool studies were ordered, but later cancelled as diarrhea has resolved.  ?

## 2021-08-16 NOTE — Assessment & Plan Note (Signed)
Potassium today is 2.9, will give 40 mEq IV potassium before discharge. ?

## 2021-08-16 NOTE — Plan of Care (Signed)
Afebrile overnight. Q4h CBG done, see result. Continuous fluids maintained. Pt restless and stayed up most of the night. PRN xanax was administered at the beginning of the shift. Pt ambulating to the bathroom independently. No calls from tele but HR in the 100s and 110s with activity at times.  ? ? ?

## 2021-08-16 NOTE — Discharge Summary (Signed)
Physician Discharge Summary   Patient: Ashley [name] MRN: [id number] DOB: November 28, 1965  Admit date:     08/13/2021  Discharge date: 08/16/21  Discharge Physician: Sharen Hones   PCP: Prince Solian, MD   Recommendations at discharge:   PCP in 1 week. Complete antibiotics  Discharge Diagnoses: Principal Problem:   Sepsis ([place]) Active Problems:   Complicated UTI (urinary tract infection)   Diarrhea   Uncontrolled type 2 diabetes mellitus with hyperglycemia, with long-term current use of insulin (HCC)   OSA on CPAP   Essential hypertension   Peripheral arterial disease (HCC)   Hypothyroidism   Hypokalemia   COPD (chronic obstructive pulmonary disease) (HCC)   Obesity (BMI 30-39.9)  Resolved Problems:   * No resolved hospital problems. *  Hospital Course: Ashley [name] is a 56 y.o. female with a history of T2DM on insulin pump, HTN, OSA on CPAP, recent admission for E. coli bacteremia, urosepsis with right ureterolithiasis s/p emergent stent placement 2/11, given ceftriaxone, then discharged on keflex, subsequently undergoing laser lithotripsy and extraction 3/3. She inadvertently removed the stent 3/5 and presented to the ED with fever, leukocytosis found to have pyuria, hematuria, and moderate right hydronephrosis/hydroureter on CT abd/pelvis. Creatinine at recent baseline 0.9-1.0. IV fluids and broad IV antibiotics were given for sepsis with presumably urinary source. She also complained of loose stools for which stool studies were ordered, but later cancelled as diarrhea has resolved.   Assessment and Plan: * Sepsis ([place]) Secondary to UTI. Condition has improved.  Complicated UTI (urinary tract infection) Blood culture negative, urine culture with E. coli, susceptible to Rocephin.  But resistant to cefazolin Augmentin.  Bacteria also susceptible to Cipro, will continue Cipro for 5 days.  Follow-up with PCP in 1 week.  Uncontrolled type 2 diabetes mellitus with  hyperglycemia, with long-term current use of insulin ([place]) Resume home regimens.  Diarrhea Diarrhea resolved.    Obesity (BMI 30-39.9) Body mass index is 33.12 kg/m.   COPD (chronic obstructive pulmonary disease) (HCC) 20 pack year history, followed by [facility]. Also had ARDS due to covid-19 pneumonia Jan 2022 requiring intubation and protracted hypoxic respiratory failure that has since resolved. Condition stable since admission  Hypokalemia Potassium today is 2.9, will give 40 mEq IV potassium before discharge.  Hypothyroidism - Continue levothyroxine  Peripheral arterial disease ([place]) Has had LE angioplasty by Dr. Gwenlyn Found. No current complaints.  - Continue ASA, statin  Essential hypertension BP is elevated, lactic acid wnl.  - Continue home metoprolol  OSA on CPAP - CPAP qHS.         Consultants: None Procedures performed: None  Disposition: Home Diet recommendation:  Discharge Diet Orders (From admission, onward)     Start     Ordered   08/16/21 0000  Diet Carb Modified        08/16/21 1035           Cardiac diet DISCHARGE MEDICATION: Allergies as of 08/16/2021       Reactions   Mirtazapine Hives, Rash   Clindamycin Hives   Macrobid [nitrofurantoin] Hives   Other Hives   Tylox    Sulfonamide Derivatives Hives   Alendronate Sodium Itching   Lipitor [atorvastatin] Other (See Comments)   MUSCLE ACHES   Restasis [cyclosporine]    Pt stated, "My eyes turned red on the inside and outside of eye; burning sensation"   Sulfa Antibiotics Hives   Topamax [topiramate] Itching        Medication List  STOP taking these medications    cephALEXin 500 MG capsule Commonly known as: Keflex   HYDROcodone-acetaminophen 5-325 MG tablet Commonly known as: NORCO/VICODIN   ondansetron 4 MG tablet Commonly known as: ZOFRAN   promethazine 25 MG tablet Commonly known as: PHENERGAN       TAKE these medications    Accu-Chek Aviva Plus  w/Device Kit   Accu-Chek Compact Plus test strip Generic drug: glucose blood 1 each by Other route as needed.   Accu-Chek Softclix Lancets lancets   acetaminophen 500 MG tablet Commonly known as: TYLENOL Take 1,000 mg by mouth every 6 (six) hours as needed for moderate pain.   ALPRAZolam 0.5 MG tablet Commonly known as: XANAX Take 0.5-1 mg by mouth 3 (three) times daily as needed for anxiety.   AMBULATORY NON FORMULARY MEDICATION NOVOLOG INSULIN PUMP USES AS DIRECTED   aspirin EC 81 MG tablet Take 81 mg by mouth daily. Swallow whole.   Breztri Aerosphere 160-9-4.8 MCG/ACT Aero Generic drug: Budeson-Glycopyrrol-Formoterol Inhale 2 puffs into the lungs in the morning and at bedtime. What changed:  how much to take when to take this   budesonide 0.25 MG/2ML nebulizer solution Commonly known as: Pulmicort Take 2 mLs (0.25 mg total) by nebulization 2 (two) times daily as needed (wheezing/dyspnea).   ciprofloxacin 500 MG tablet Commonly known as: Cipro Take 1 tablet (500 mg total) by mouth 2 (two) times daily for 5 days.   clopidogrel 75 MG tablet Commonly known as: PLAVIX TAKE 1 TABLET EVERY DAY WITH BREAKFAST   DULoxetine 30 MG capsule Commonly known as: CYMBALTA Take 30 mg by mouth every morning.   estradiol 1 MG tablet Commonly known as: ESTRACE Take 1 mg by mouth at bedtime.   famotidine 40 MG tablet Commonly known as: PEPCID TAKE 1 TABLET AT BEDTIME   folic acid 1 MG tablet Commonly known as: FOLVITE Take 1 tablet by mouth daily.   HAIR/SKIN/NAILS PO Take 1 capsule by mouth at bedtime.   insulin pump Soln Inject into the skin. Novolog----  basal rate--- 12 am - 2 am = 1.3, 2 am - 6 am = 1.4, 6 am - 3 pm = 1.8, 3 pm - 12 am = 1.55   lansoprazole 30 MG capsule Commonly known as: PREVACID Take 1 capsule (30 mg total) by mouth 2 (two) times daily before a meal.   latanoprost 0.005 % ophthalmic solution Commonly known as: XALATAN Place 1 drop into both  eyes at bedtime.   levocetirizine 5 MG tablet Commonly known as: XYZAL Take 5 mg by mouth every morning.   levothyroxine 125 MCG tablet Commonly known as: SYNTHROID Take 125 mcg by mouth daily before breakfast.   methotrexate 2.5 MG tablet Commonly known as: RHEUMATREX Take 6 tablets (15 mg total) by mouth once a week. 3 tablets am and at bedtime twice a day once a week,  Saturday's What changed:  how much to take when to take this additional instructions   metoprolol succinate 50 MG 24 hr tablet Commonly known as: TOPROL-XL Take 50 mg by mouth daily. Take with or immediately following a meal.   montelukast 10 MG tablet Commonly known as: SINGULAIR Take 10 mg by mouth at bedtime.   NovoLOG 100 UNIT/ML injection Generic drug: insulin aspart Inject into the skin See admin instructions. Pt has Insulin pump   pregabalin 75 MG capsule Commonly known as: LYRICA Take 75-150 mg by mouth See admin instructions. Take 75 mg in the morning, 75 mg at lunch,  and 150 mg at bedtime   ProAir Digihaler 108 (90 Base) MCG/ACT Aepb Generic drug: Albuterol Sulfate (sensor) Inhale 2 puffs into the lungs every 6 (six) hours as needed (shortness of breath).   albuterol (2.5 MG/3ML) 0.083% nebulizer solution Commonly known as: PROVENTIL Take 3 mLs (2.5 mg total) by nebulization every 4 (four) hours as needed for wheezing or shortness of breath.   rosuvastatin 20 MG tablet Commonly known as: CRESTOR Take 20 mg by mouth at bedtime.   sertraline 50 MG tablet Commonly known as: ZOLOFT Take 50 mg by mouth daily.   Vitamin D (Ergocalciferol) 1.25 MG (50000 UNIT) Caps capsule Commonly known as: DRISDOL Take 50,000 Units by mouth every Saturday.        Follow-up Information     Avva, Ravisankar, MD Follow up in 1 week(s).   Specialty: Internal Medicine Contact information: [address] [address] [phone]         Lorretta Harp, MD .   Specialties:  Cardiology, Radiology Contact information: [address] [place] [address] [phone]                Discharge Exam: Danley Danker Weights   08/13/21 2150 08/14/21 2100  Weight: 74.4 kg 74.3 kg   General exam: Appears calm and comfortable  Respiratory system: Clear to auscultation. Respiratory effort normal. Cardiovascular system: S1 & S2 heard, RRR. No JVD, murmurs, rubs, gallops or clicks. No pedal edema. Gastrointestinal system: Abdomen is nondistended, soft and nontender. No organomegaly or masses felt. Normal bowel sounds heard. Central nervous system: Alert and oriented. No focal neurological deficits. Extremities: Symmetric 5 x 5 power. Skin: No rashes, lesions or ulcers Psychiatry: Judgement and insight appear normal. Mood & affect appropriate.    Condition at discharge: good  The results of significant diagnostics from this hospitalization (including imaging, microbiology, ancillary and laboratory) are listed below for reference.   Imaging Studies: CT ABDOMEN PELVIS W CONTRAST  Result Date: 08/13/2021 CLINICAL DATA:  Recent right kidney stent placement with fever and chills. EXAM: CT ABDOMEN AND PELVIS WITH CONTRAST TECHNIQUE: Multidetector CT imaging of the abdomen and pelvis was performed using the standard protocol following bolus administration of intravenous contrast. RADIATION DOSE REDUCTION: This exam was performed according to the departmental dose-optimization program which includes automated exposure control, adjustment of the mA and/or kV according to patient size and/or use of iterative reconstruction technique. CONTRAST:  158m OMNIPAQUE IOHEXOL 300 MG/ML  SOLN COMPARISON:  July 22, 2021 FINDINGS: Lower chest: Mild atelectasis is seen within the bilateral lung bases. Hepatobiliary: There is diffuse fatty infiltration of the liver parenchyma. No focal liver abnormality is seen. No gallstones, gallbladder wall thickening, or biliary dilatation.  Pancreas: Unremarkable. No pancreatic ductal dilatation or surrounding inflammatory changes. Spleen: Normal in size without focal abnormality. Adrenals/Urinary Tract: Adrenal glands are unremarkable. The kidneys are normal in size. A subcentimeter cystic appearing area is seen within the mid left kidney. Bilateral subcentimeter nonobstructing calculi are present. There is mild right-sided hydronephrosis with moderate severity right-sided hydroureter to the level proximal to the right UVJ. The obstructing right ureteral calculi seen within the distal right ureter on the prior study are no longer visualized. Mild, stable, bilateral perinephric inflammatory fat stranding is noted. The urinary bladder is moderately distended and otherwise unremarkable. Stomach/Bowel: Stomach is within normal limits. Appendix appears normal. No evidence of bowel wall thickening, distention, or inflammatory changes. Vascular/Lymphatic: Aortic atherosclerosis with bilateral aortoiliac stents. No enlarged abdominal or pelvic lymph nodes.  Reproductive: Status post hysterectomy. No adnexal masses. Other: No abdominal wall hernia or abnormality. No abdominopelvic ascites. Musculoskeletal: No acute or significant osseous findings. IMPRESSION: 1. Interval passage of the distal right ureteral calculi seen on the prior study with residual right-sided hydronephrosis and hydroureter. 2. Bilateral subcentimeter nonobstructing renal calculi. 3. Hepatic steatosis. Aortic Atherosclerosis (ICD10-I70.0). Electronically Signed   By: Virgina Norfolk M.D.   On: 08/13/2021 23:58   DG Chest Port 1 View  Result Date: 08/13/2021 CLINICAL DATA:  Questionable sepsis. EXAM: PORTABLE CHEST 1 VIEW COMPARISON:  Radiograph 07/22/2021 FINDINGS: The cardiomediastinal contours are normal. Mild chronic bronchial thickening. Pulmonary vasculature is normal. No consolidation, pleural effusion, or pneumothorax. No acute osseous abnormalities are seen. IMPRESSION: Mild  chronic bronchial thickening without acute abnormality. Electronically Signed   By: Keith Rake M.D.   On: 08/13/2021 22:09   DG Chest Port 1 View  Result Date: 07/22/2021 CLINICAL DATA:  Sepsis. EXAM: PORTABLE CHEST 1 VIEW COMPARISON:  Chest radiograph dated 12/22/2020. FINDINGS: No focal consolidation, pleural effusion, pneumothorax. The cardiac silhouette is within limits. No acute osseous pathology. IMPRESSION: No active disease. Electronically Signed   By: Anner Crete M.D.   On: 07/22/2021 20:44   DG OR UROLOGY CYSTO IMAGE ([facility] ONLY)  Result Date: 08/11/2021 There is no interpretation for this exam.  This order is for images obtained during a surgical procedure.  Please See "Surgeries" Tab for more information regarding the procedure.   DG OR UROLOGY CYSTO IMAGE ([facility] ONLY)  Result Date: 07/22/2021 There is no interpretation for this exam.  This order is for images obtained during a surgical procedure.  Please See "Surgeries" Tab for more information regarding the procedure.   CT Renal Stone Study  Result Date: 07/22/2021 CLINICAL DATA:  Flank pain. Kidney stone suspected. Weakness, chills, nausea and shortness of breath. EXAM: CT ABDOMEN AND PELVIS WITHOUT CONTRAST TECHNIQUE: Multidetector CT imaging of the abdomen and pelvis was performed following the standard protocol without IV contrast. RADIATION DOSE REDUCTION: This exam was performed according to the departmental dose-optimization program which includes automated exposure control, adjustment of the mA and/or kV according to patient size and/or use of iterative reconstruction technique. COMPARISON:  01/07/2010 FINDINGS: Lower chest: Mild dependent atelectasis in the right lower lobe. Hepatobiliary: Liver appears normal without contrast. No calcified gallstones. Pancreas: Normal Spleen: Normal Adrenals/Urinary Tract: Adrenal glands are normal. Left kidney contains numerous nonobstructing calculi. The largest in the lower pole  measure 5 mm. No hydronephrosis on the left. The right kidney contains a nonobstructing 4 mm stone in the lower pole. There is hydroureteronephrosis on the right with the ureter dilated to the level just proximal to the UVJ. There are 3 stones in the distal ureter. There is a 2 mm stone at the UVJ. There is a 7-8 mm stone just proximal to that. There is a 2 mm stone just proximal to that. No stone in the bladder. Stomach/Bowel: Stomach and small intestine are normal. Normal appendix. Normal colon. Vascular/Lymphatic: Aortic atherosclerosis. No aneurysm. Aortoiliac stents are present. IVC is normal. No adenopathy. Reproductive: Previous hysterectomy.  No pelvic mass. Other: No free fluid or air. Small umbilical hernia containing only fat. Musculoskeletal: Normal IMPRESSION: Hydroureteronephrosis on the right. Three stones in the distal ureter. 2 mm stone at the right UVJ. 7-8 mm stone just proximal to that. 2 mm stone just proximal to the left. Nonobstructing 4 mm stone in the lower pole the right kidney. Multiple nonobstructing stones in the left kidney, the largest  in the lower pole measuring 5 mm. Aortic atherosclerosis.  Bilateral aortoiliac stents. Electronically Signed   By: Nelson Chimes M.D.   On: 07/22/2021 21:50    Microbiology: Results for orders placed or performed during the hospital encounter of 08/13/21  Resp Panel by RT-PCR (Flu A&B, Covid) Nasopharyngeal Swab     Status: None   Collection Time: 08/13/21 10:17 PM   Specimen: Nasopharyngeal Swab; Nasopharyngeal(NP) swabs in vial transport medium  Result Value Ref Range Status   SARS Coronavirus 2 by RT PCR NEGATIVE NEGATIVE Final    Comment: (NOTE) SARS-CoV-2 target nucleic acids are NOT DETECTED.  The SARS-CoV-2 RNA is generally detectable in upper respiratory specimens during the acute phase of infection. The lowest concentration of SARS-CoV-2 viral copies this assay can detect is 138 copies/mL. A negative result does not preclude  SARS-Cov-2 infection and should not be used as the sole basis for treatment or other patient management decisions. A negative result may occur with  improper specimen collection/handling, submission of specimen other than nasopharyngeal swab, presence of viral mutation(s) within the areas targeted by this assay, and inadequate number of viral copies(<138 copies/mL). A negative result must be combined with clinical observations, patient history, and epidemiological information. The expected result is Negative.  Fact Sheet for Patients:  EntrepreneurPulse.com.au  Fact Sheet for Healthcare Providers:  IncredibleEmployment.be  This test is no t yet approved or cleared by the [place] FDA and  has been authorized for detection and/or diagnosis of SARS-CoV-2 by FDA under an Emergency Use Authorization (EUA). This EUA will remain  in effect (meaning this test can be used) for the duration of the COVID-19 declaration under Section 564(b)(1) of the Act, 21 U.S.C.section 360bbb-3(b)(1), unless the authorization is terminated  or revoked sooner.       Influenza A by PCR NEGATIVE NEGATIVE Final   Influenza B by PCR NEGATIVE NEGATIVE Final    Comment: (NOTE) The Xpert Xpress SARS-CoV-2/FLU/RSV plus assay is intended as an aid in the diagnosis of influenza from Nasopharyngeal swab specimens and should not be used as a sole basis for treatment. Nasal washings and aspirates are unacceptable for Xpert Xpress SARS-CoV-2/FLU/RSV testing.  Fact Sheet for Patients: EntrepreneurPulse.com.au  Fact Sheet for Healthcare Providers: IncredibleEmployment.be  This test is not yet approved or cleared by the [place] FDA and has been authorized for detection and/or diagnosis of SARS-CoV-2 by FDA under an Emergency Use Authorization (EUA). This EUA will remain in effect (meaning this test can be used) for the duration of  the COVID-19 declaration under Section 564(b)(1) of the Act, 21 U.S.C. section 360bbb-3(b)(1), unless the authorization is terminated or revoked.  Performed at [facility], [address] [place], [place] [address]   Blood Culture (routine x 2)     Status: None (Preliminary result)   Collection Time: 08/13/21 10:17 PM   Specimen: BLOOD  Result Value Ref Range Status   Specimen Description BLOOD RIGHT ANTECUBITAL  Final   Special Requests   Final    BOTTLES DRAWN AEROBIC AND ANAEROBIC Blood Culture results may not be optimal due to an inadequate volume of blood received in culture bottles   Culture   Final    NO GROWTH 3 DAYS Performed at [facility], [address] [place], [place] [address]    Report Status PENDING  Incomplete  Urine Culture     Status: Abnormal   Collection Time: 08/13/21 10:17 PM   Specimen: In/Out Cath Urine  Result Value Ref  Range Status   Specimen Description   Final    IN/OUT CATH URINE Performed at [facility], [address] [place], [place] [address]    Special Requests   Final    NONE Performed at [facility], [address] [place], [place] [address]    Culture >=100,000 COLONIES/mL ESCHERICHIA COLI (A)  Final   Report Status 08/16/2021 FINAL  Final   Organism ID, Bacteria ESCHERICHIA COLI (A)  Final      Susceptibility   Escherichia coli - MIC*    AMPICILLIN >=32 RESISTANT Resistant     CEFAZOLIN >=64 RESISTANT Resistant     CEFEPIME <=0.12 SENSITIVE Sensitive     CEFTRIAXONE 0.5 SENSITIVE Sensitive     CIPROFLOXACIN <=0.25 SENSITIVE Sensitive     GENTAMICIN <=1 SENSITIVE Sensitive     IMIPENEM <=0.25 SENSITIVE Sensitive     NITROFURANTOIN <=16 SENSITIVE Sensitive     TRIMETH/SULFA <=20 SENSITIVE Sensitive     AMPICILLIN/SULBACTAM >=32 RESISTANT Resistant     PIP/TAZO <=4 SENSITIVE Sensitive     * >=100,000 COLONIES/mL ESCHERICHIA COLI  Culture, blood (Routine X 2) w Reflex to ID Panel      Status: None (Preliminary result)   Collection Time: 08/14/21  1:32 AM   Specimen: BLOOD  Result Value Ref Range Status   Specimen Description BLOOD BLOOD RIGHT HAND  Final   Special Requests   Final    BOTTLES DRAWN AEROBIC AND ANAEROBIC Blood Culture adequate volume   Culture   Final    NO GROWTH 2 DAYS Performed at [facility], [address] [place], [place] [address]    Report Status PENDING  Incomplete    Labs: CBC: Recent Labs  Lab 08/13/21 2200 08/14/21 0504 08/15/21 0513  WBC 13.4* 13.5* 10.3  NEUTROABS 11.0*  --   --   HGB 11.3* 10.3* 9.7*  HCT 35.3* 32.9* 30.2*  MCV 90.1 89.4 88.0  PLT 227 216 124   Basic Metabolic Panel: Recent Labs  Lab 08/13/21 2200 08/14/21 0504 08/15/21 0513 08/16/21 0845  NA 134*  --  140 137  K 3.3*  --  3.1* 2.9*  CL 98  --  104 104  CO2 23  --  24 21*  GLUCOSE 258*  --  166* 193*  BUN 14  --  9 7  CREATININE 0.96 1.03* 0.86 0.89  CALCIUM 8.7*  --  8.6* 8.6*  MG  --   --   --  1.9   Liver Function Tests: Recent Labs  Lab 08/13/21 2200  AST 18  ALT 20  ALKPHOS 91  BILITOT 0.8  PROT 6.8  ALBUMIN 3.4*   CBG: Recent Labs  Lab 08/15/21 1636 08/15/21 2032 08/15/21 2356 08/16/21 0434 08/16/21 0748  GLUCAP 220* 223* 159* 257* 148*    Discharge time spent: greater than 30 minutes.  Signed: Sharen Hones, MD [facility] 08/16/2021

## 2021-08-17 ENCOUNTER — Ambulatory Visit: Payer: Medicare HMO | Admitting: Pulmonary Disease

## 2021-08-18 LAB — CULTURE, BLOOD (ROUTINE X 2): Culture: NO GROWTH

## 2021-08-19 LAB — CULTURE, BLOOD (ROUTINE X 2)
Culture: NO GROWTH
Special Requests: ADEQUATE

## 2021-08-22 ENCOUNTER — Telehealth: Payer: Self-pay | Admitting: Urology

## 2021-08-22 NOTE — Telephone Encounter (Signed)
Ashley [name] has been discharged from the hospital and she needs to be scheduled for an appointment in 6 months with [facility] with Dr. Diamantina Providence.  ?

## 2021-08-25 NOTE — Telephone Encounter (Signed)
LMOM TO CALL BACK TO SCHEDULE A 6 MONTH FOLLOW UP WITH [facility] PRIOR WITH SNINSKY PER SHANNON ?

## 2021-08-28 ENCOUNTER — Ambulatory Visit: Payer: Medicare HMO | Admitting: Nurse Practitioner

## 2021-08-28 NOTE — Progress Notes (Deleted)
? ? ?Office Visit  ?  ?Patient Name: Ashley [name] ?Date of Encounter: 08/28/2021 ? ?Primary Care Provider:  Prince Solian, MD ?Primary Cardiologist:  Quay Burow, MD ? ?Chief Complaint  ?  ?56 year old female with a history of PAD, hypertension, hyperlipidemia, type 1 diabetes, GERD, OSA on CPAP, hypothyroidism, COPD, and tobacco use who presents for follow-up related to PAD and hypertension. ? ?Past Medical History  ?  ?Past Medical History:  ?Diagnosis Date  ? Anxiety   ? COPD (chronic obstructive pulmonary disease) ([place])   ? Depression   ? DOE (dyspnea on exertion)   ? Dyspnea   ? Gastroparesis   ? GERD (gastroesophageal reflux disease)   ? Glaucoma, both eyes   ? History of 2019 novel coronavirus disease (COVID-19) 06/20/2020  ? hospital admission-- dx severe covid with pneumonia, ARDS, DKA, COPD exaberation;  pt intubated 01-13th and extubated 01-20th;   (11-15-2020  per pt did not go home on oxygen, residual generalized weakness and hair loss)  ? History of esophageal stricture   ? post dilatation, followed by dr Henrene Pastor  ? History of Graves' disease   ? s/p RAI  03-03-2010  ? History of Helicobacter pylori infection   ? remote hx and treatment  ? History of kidney stones   ? Hyperlipidemia   ? Hypertension   ? followed by pcp  ? Hypothyroidism, postradioiodine therapy 02/2010  ? followed by pcp  ? IDA (iron deficiency anemia)   ? Insulin pump in place   ? w/ Novolog , managed by pcp  ? Moderate asthma   ? followed by pcp,  (11-15-2020  per pt has pulmonology appt on 11-17-2020))  ? OSA (obstructive sleep apnea)   ? 11-15-2020  per pt has not used cpap since 05/ 2021  (previous followed by dr dohmeier,  study in epic 05-12-2018 moderate complex osa pt had used for awhile)  ? Osteoporosis   ? PAD (peripheral artery disease) ([place])   ? followed by dr berry----  s/p bilateral common iliac artery angioplasty stenting for stenosis  ? Peripheral neuropathy   ? Pneumonia   ? covid 2022  ? PONV (postoperative  nausea and vomiting)   ? Rheumatoid arthritis ([place])   ? in both eyes  ? Right wrist fracture   ? Type 1 diabetes mellitus with long-term current use of insulin ([place])   ? per pt dx at age 85 approx,  followed by pcp,  (11-15-2020  per pt last A1c 7.2 in 04/ 2022),  checks blood sugar 6 to 10 times daily , fasting sugar-- 200 -- 300)  ? Vitamin D deficiency   ? ?Past Surgical History:  ?Procedure Laterality Date  ? ABDOMINAL AORTOGRAM W/LOWER EXTREMITY Bilateral 01/01/2019  ? Procedure: ABDOMINAL AORTOGRAM W/LOWER EXTREMITY;  Surgeon: Lorretta Harp, MD;  Location: [facility];  Service: Cardiovascular;  Laterality: Bilateral;  ? BREAST EXCISIONAL BIOPSY Left 04-*02-2000  _0   ? per pt benign  ? BREAST SURGERY    ? CATARACT EXTRACTION W/ INTRAOCULAR LENS  IMPLANT, BILATERAL  11/2019  ? COLONOSCOPY  last one 2019 approx  ? CYSTOSCOPY WITH STENT PLACEMENT Right 07/22/2021  ? Procedure: CYSTOSCOPY WITH STENT PLACEMENT;  Surgeon: Billey Co, MD;  Location: [facility];  Service: Urology;  Laterality: Right;  ? CYSTOSCOPY/URETEROSCOPY/HOLMIUM LASER/STENT PLACEMENT Right 08/11/2021  ? Procedure: CYSTOSCOPY/URETEROSCOPY/HOLMIUM LASER/STENT EXCHANGE;  Surgeon: Billey Co, MD;  Location: [facility];  Service: Urology;  Laterality: Right;  ? FRACTURE SURGERY    ?  LAPAROSCOPIC ASSISTED VAGINAL HYSTERECTOMY  06-22-2004 _0   ? NASAL SEPTOPLASTY W/ TURBINOPLASTY Bilateral 09/07/2013  ? Procedure: SEPTOPLASTY, BILATERAL TURBINATE RESECTION ;  Surgeon: Ascencion Dike, MD;  Location: [place];  Service: ENT;  Laterality: Bilateral;  ? OPEN REDUCTION INTERNAL FIXATION (ORIF) DISTAL RADIAL FRACTURE Right 11/16/2020  ? Procedure: OPEN REDUCTION INTERNAL FIXATION (ORIF) DISTAL RADIAL FRACTURE;  Surgeon: Hiram Gash, MD;  Location: [place];  Service: Orthopedics;  Laterality: Right;  ? PERIPHERAL VASCULAR INTERVENTION Bilateral 01/01/2019  ? Procedure: PERIPHERAL VASCULAR INTERVENTION;   Surgeon: Lorretta Harp, MD;  Location: [facility];  Service: Cardiovascular;  Laterality: Bilateral;  ? UPPER GASTROINTESTINAL ENDOSCOPY  last one 07-23-2019  dr Henrene Pastor  ? ? ?Allergies ? ?Allergies  ?Allergen Reactions  ? Mirtazapine Hives and Rash  ? Clindamycin Hives  ? Macrobid [Nitrofurantoin] Hives  ? Other Hives  ?  Tylox   ? Sulfonamide Derivatives Hives  ? Alendronate Sodium Itching  ? Lipitor [Atorvastatin] Other (See Comments)  ?  MUSCLE ACHES  ? Restasis [Cyclosporine]   ?  Pt stated, "My eyes turned red on the inside and outside of eye; burning sensation"  ? Sulfa Antibiotics Hives  ? Topamax [Topiramate] Itching  ? ? ?History of Present Illness  ?  ?56 year old female with the above past medical history including PAD, hypertension, hyperlipidemia, type 1 diabetes, GERD, OSA on CPAP, hypothyroidism, COPD, and tobacco use. ? ?Lower extremity Dopplers in July 2020 revealed right ABI 0.64 and left 0.55, high-grade bilateral iliac disease.  Peripheral angiography in July 2020 revealed high-grade distal abdominal aortic stenosis.  She underwent bilateral PTA and covered stenting using kissing stent technique.  Follow-up Dopplers normalized and her claudication resolved. She was hospitalized in January 2022 with COVID-pneumonia.  She was last seen in the office on 02/01/2021 stable from a cardiac standpoint though she reported ongoing lower extremity edema, amlodipine was ultimately discontinued.  Aortic/IVC/iliac ultrasound in August 2022 showed moderate increased velocities to the distal abdominal aorta and bilateral proximal iliac arteries s/p kissing balloon covered stenting at the aortic bifurcation.  Repeat study recommended in 12 months. ? ?She was hospitalized from 08/13/2021 to 08/16/2021 in the setting of a secondary to complicated UTI following right urolithiasis s/p emergent stent placement in February 2023 (stent was removed on 08/13/2021).  BP was elevated during admission.  He was treated  with IV fluids and broad-spectrum IV antibiotics.  She presents today for follow-up.  Since her hospitalization ? ? ?PAD: ?Hypertension: ?Hyperlipidemia: ?OSA on CPAP: ?Type 1 Diabetes: ?Tobacco use: ?Disposition: ? ?Home Medications  ?  ?Current Outpatient Medications  ?Medication Sig Dispense Refill  ? ACCU-CHEK COMPACT PLUS test strip 1 each by Other route as needed.     ? ACCU-CHEK SOFTCLIX LANCETS lancets     ? acetaminophen (TYLENOL) 500 MG tablet Take 1,000 mg by mouth every 6 (six) hours as needed for moderate pain.    ? albuterol (PROVENTIL) (2.5 MG/3ML) 0.083% nebulizer solution Take 3 mLs (2.5 mg total) by nebulization every 4 (four) hours as needed for wheezing or shortness of breath. 75 mL 3  ? Albuterol Sulfate, sensor, (PROAIR DIGIHALER) 108 (90 Base) MCG/ACT AEPB Inhale 2 puffs into the lungs every 6 (six) hours as needed (shortness of breath).    ? ALPRAZolam (XANAX) 0.5 MG tablet Take 0.5-1 mg by mouth 3 (three) times daily as needed for anxiety.    ? AMBULATORY NON FORMULARY MEDICATION NOVOLOG INSULIN PUMP ?USES AS DIRECTED    ?  aspirin EC 81 MG tablet Take 81 mg by mouth daily. Swallow whole.    ? Biotin w/ Vitamins C & E (HAIR/SKIN/NAILS PO) Take 1 capsule by mouth at bedtime.    ? Blood Glucose Monitoring Suppl (ACCU-CHEK AVIVA PLUS) w/Device KIT     ? Budeson-Glycopyrrol-Formoterol (BREZTRI AEROSPHERE) 160-9-4.8 MCG/ACT AERO Inhale 2 puffs into the lungs in the morning and at bedtime. (Patient taking differently: Inhale 1 puff into the lungs every morning.) 10.7 g 3  ? budesonide (PULMICORT) 0.25 MG/2ML nebulizer solution Take 2 mLs (0.25 mg total) by nebulization 2 (two) times daily as needed (wheezing/dyspnea).    ? clopidogrel (PLAVIX) 75 MG tablet TAKE 1 TABLET EVERY DAY WITH BREAKFAST 90 tablet 3  ? DULoxetine (CYMBALTA) 30 MG capsule Take 30 mg by mouth every morning.  1  ? estradiol (ESTRACE) 1 MG tablet Take 1 mg by mouth at bedtime.    ? famotidine (PEPCID) 40 MG tablet TAKE 1 TABLET  AT BEDTIME 90 tablet 1  ? folic acid (FOLVITE) 1 MG tablet Take 1 tablet by mouth daily.    ? Insulin Human (INSULIN PUMP) SOLN Inject into the skin. Novolog----  basal rate--- 12 am - 2 am = 1.3, 2 am - 6

## 2021-09-02 ENCOUNTER — Other Ambulatory Visit: Payer: Self-pay | Admitting: Internal Medicine

## 2021-09-05 DIAGNOSIS — E109 Type 1 diabetes mellitus without complications: Secondary | ICD-10-CM | POA: Diagnosis not present

## 2021-09-05 DIAGNOSIS — Z961 Presence of intraocular lens: Secondary | ICD-10-CM | POA: Diagnosis not present

## 2021-09-05 DIAGNOSIS — H30033 Focal chorioretinal inflammation, peripheral, bilateral: Secondary | ICD-10-CM | POA: Diagnosis not present

## 2021-09-05 DIAGNOSIS — Z79899 Other long term (current) drug therapy: Secondary | ICD-10-CM | POA: Diagnosis not present

## 2021-09-05 DIAGNOSIS — H3581 Retinal edema: Secondary | ICD-10-CM | POA: Diagnosis not present

## 2021-09-07 ENCOUNTER — Encounter: Payer: Self-pay | Admitting: Pulmonary Disease

## 2021-09-07 ENCOUNTER — Ambulatory Visit: Payer: Medicare HMO | Admitting: Pulmonary Disease

## 2021-09-07 VITALS — BP 122/70 | HR 93 | Ht 59.0 in | Wt 161.6 lb

## 2021-09-07 DIAGNOSIS — Z87891 Personal history of nicotine dependence: Secondary | ICD-10-CM | POA: Diagnosis not present

## 2021-09-07 DIAGNOSIS — Z8616 Personal history of COVID-19: Secondary | ICD-10-CM | POA: Diagnosis not present

## 2021-09-07 DIAGNOSIS — J4540 Moderate persistent asthma, uncomplicated: Secondary | ICD-10-CM | POA: Diagnosis not present

## 2021-09-07 LAB — CBC WITH DIFFERENTIAL/PLATELET
Basophils Absolute: 0.1 10*3/uL (ref 0.0–0.1)
Basophils Relative: 1 % (ref 0.0–3.0)
Eosinophils Absolute: 0.2 10*3/uL (ref 0.0–0.7)
Eosinophils Relative: 2.4 % (ref 0.0–5.0)
HCT: 38.4 % (ref 36.0–46.0)
Hemoglobin: 12.4 g/dL (ref 12.0–15.0)
Lymphocytes Relative: 36.7 % (ref 12.0–46.0)
Lymphs Abs: 3.2 10*3/uL (ref 0.7–4.0)
MCHC: 32.3 g/dL (ref 30.0–36.0)
MCV: 86.8 fl (ref 78.0–100.0)
Monocytes Absolute: 0.8 10*3/uL (ref 0.1–1.0)
Monocytes Relative: 8.8 % (ref 3.0–12.0)
Neutro Abs: 4.5 10*3/uL (ref 1.4–7.7)
Neutrophils Relative %: 51.1 % (ref 43.0–77.0)
Platelets: 293 10*3/uL (ref 150.0–400.0)
RBC: 4.43 Mil/uL (ref 3.87–5.11)
RDW: 14.4 % (ref 11.5–15.5)
WBC: 8.8 10*3/uL (ref 4.0–10.5)

## 2021-09-07 MED ORDER — BREZTRI AEROSPHERE 160-9-4.8 MCG/ACT IN AERO: 2.0000 | INHALATION_SPRAY | Freq: Two times a day (BID) | RESPIRATORY_TRACT | 0 refills | Status: DC

## 2021-09-07 MED ORDER — BREZTRI AEROSPHERE 160-9-4.8 MCG/ACT IN AERO: 2.0000 | INHALATION_SPRAY | Freq: Two times a day (BID) | RESPIRATORY_TRACT | 5 refills | Status: DC

## 2021-09-07 NOTE — Patient Instructions (Signed)
Thank you for visiting Dr. Valeta Harms at [facility]. ?Today we recommend the following: ? ?Restart Breztri ?New prescription today  ?Continue albuterol as needed  ?CBC w/ Diff, RAST panel today  ? ?Return in about 4 weeks (around 10/05/2021) for w/ Rexene Edison, NP . ? ? ? ?Please do your part to reduce the spread of COVID-19.  ? ?

## 2021-09-07 NOTE — Progress Notes (Signed)
? ?Synopsis: Referred in July 2022 for history of covid, by Prince Solian, MD ? ?Subjective:  ? ?PATIENT ID: Ashley [name] GENDER: female DOB: 09-24-1965, MRN: [id number] ? ?Chief Complaint  ?Patient presents with  ? Follow-up  ?  Pt states she has been having flare ups with her asthma. Has had complaints of wheezing, SOB, and coughing.  ? ? ?56 year old female, past medical history of COPD, former smoker, 40 years, half pack per day, 20-pack-year history, quit January 2021.  Gastroparesis, GERD, hypertension, hyperlipidemia.  Patient developed COVID-19 bilateral pneumonia in January 2022.  She was admitted to [facility] was intubated and placed on mechanical life support for 8 days.  She has had a subsequent long recovery but is now off oxygen.  She still feels short of breath and has cough at times. ? ?OV 06/07/2021: Here today for follow-up regarding COPD/asthma.  She is also post COVID with ongoing symptoms despite inhaler regimen.  She is currently on Breo plus Pulmicort plus as needed albuterol.  Last set of labs were completed during a hospitalization with no significant eosinophilic elevation, eosinophils were 100 but she was on prednisone during this hospitalization back in January 2022.  She still has triggers from smoke or any kind of particulate matter.  Strong smells in the cold weather are still stimulating her to have difficulty breathing.  It seems that she has more of a reactive airway at this time.  Of note she recently had a urinary tract infection was given Macrobid.  She is allergic to sulfa medications.  She broke out in a diffuse morbilliform rash. ? ?OV 09/07/2021: Here today for follow-up regarding asthma.  Initially was seen post COVID.  Currently at that time was on Breo and Pulmicort.  We switched her to [place].  She used her inhalers for a short period and has fallen off using any of those.  At this point maintaining on albuterol.  She went through a whole lot  in January of this past year after her husband died from a heart attack at home.  She still unfortunately is having significant shortness of breath. ? ? ? ?Past Medical History:  ?Diagnosis Date  ? Anxiety   ? COPD (chronic obstructive pulmonary disease) ([place])   ? Depression   ? DOE (dyspnea on exertion)   ? Dyspnea   ? Gastroparesis   ? GERD (gastroesophageal reflux disease)   ? Glaucoma, both eyes   ? History of 2019 novel coronavirus disease (COVID-19) 06/20/2020  ? hospital admission-- dx severe covid with pneumonia, ARDS, DKA, COPD exaberation;  pt intubated 01-13th and extubated 01-20th;   (11-15-2020  per pt did not go home on oxygen, residual generalized weakness and hair loss)  ? History of esophageal stricture   ? post dilatation, followed by dr Henrene Pastor  ? History of Graves' disease   ? s/p RAI  03-03-2010  ? History of Helicobacter pylori infection   ? remote hx and treatment  ? History of kidney stones   ? Hyperlipidemia   ? Hypertension   ? followed by pcp  ? Hypothyroidism, postradioiodine therapy 02/2010  ? followed by pcp  ? IDA (iron deficiency anemia)   ? Insulin pump in place   ? w/ Novolog , managed by pcp  ? Moderate asthma   ? followed by pcp,  (11-15-2020  per pt has pulmonology appt on 11-17-2020))  ? OSA (obstructive sleep apnea)   ? 11-15-2020  per pt has not used cpap  since 05/ 2021  (previous followed by dr dohmeier,  study in epic 05-12-2018 moderate complex osa pt had used for awhile)  ? Osteoporosis   ? PAD (peripheral artery disease) ([place])   ? followed by dr berry----  s/p bilateral common iliac artery angioplasty stenting for stenosis  ? Peripheral neuropathy   ? Pneumonia   ? covid 2022  ? PONV (postoperative nausea and vomiting)   ? Rheumatoid arthritis ([place])   ? in both eyes  ? Right wrist fracture   ? Type 1 diabetes mellitus with long-term current use of insulin ([place])   ? per pt dx at age 18 approx,  followed by pcp,  (11-15-2020  per pt last A1c 7.2 in 04/ 2022),  checks blood  sugar 6 to 10 times daily , fasting sugar-- 200 -- 300)  ? Vitamin D deficiency   ?  ? ?Family History  ?Problem Relation Age of Onset  ? Diabetes Father   ? Heart attack Father   ? Alcoholism Father   ? Lung cancer Mother   ? Liver cancer Mother   ? Irritable bowel syndrome Sister   ? Colon cancer Neg Hx   ? Esophageal cancer Neg Hx   ? Rectal cancer Neg Hx   ? Stomach cancer Neg Hx   ?  ? ?Past Surgical History:  ?Procedure Laterality Date  ? ABDOMINAL AORTOGRAM W/LOWER EXTREMITY Bilateral 01/01/2019  ? Procedure: ABDOMINAL AORTOGRAM W/LOWER EXTREMITY;  Surgeon: Lorretta Harp, MD;  Location: [place] [facility];  Service: Cardiovascular;  Laterality: Bilateral;  ? BREAST EXCISIONAL BIOPSY Left 04-*02-2000  '@[facility]'$   ? per pt benign  ? BREAST SURGERY    ? CATARACT EXTRACTION W/ INTRAOCULAR LENS  IMPLANT, BILATERAL  11/2019  ? COLONOSCOPY  last one 2019 approx  ? CYSTOSCOPY WITH STENT PLACEMENT Right 07/22/2021  ? Procedure: CYSTOSCOPY WITH STENT PLACEMENT;  Surgeon: Billey Co, MD;  Location: [facility];  Service: Urology;  Laterality: Right;  ? CYSTOSCOPY/URETEROSCOPY/HOLMIUM LASER/STENT PLACEMENT Right 08/11/2021  ? Procedure: CYSTOSCOPY/URETEROSCOPY/HOLMIUM LASER/STENT EXCHANGE;  Surgeon: Billey Co, MD;  Location: [facility];  Service: Urology;  Laterality: Right;  ? FRACTURE SURGERY    ? LAPAROSCOPIC ASSISTED VAGINAL HYSTERECTOMY  06-22-2004 '@[facility]'$   ? NASAL SEPTOPLASTY W/ TURBINOPLASTY Bilateral 09/07/2013  ? Procedure: SEPTOPLASTY, BILATERAL TURBINATE RESECTION ;  Surgeon: Ascencion Dike, MD;  Location: [place];  Service: ENT;  Laterality: Bilateral;  ? OPEN REDUCTION INTERNAL FIXATION (ORIF) DISTAL RADIAL FRACTURE Right 11/16/2020  ? Procedure: OPEN REDUCTION INTERNAL FIXATION (ORIF) DISTAL RADIAL FRACTURE;  Surgeon: Hiram Gash, MD;  Location: [place];  Service: Orthopedics;  Laterality: Right;  ? PERIPHERAL VASCULAR INTERVENTION Bilateral 01/01/2019  ? Procedure:  PERIPHERAL VASCULAR INTERVENTION;  Surgeon: Lorretta Harp, MD;  Location: [facility];  Service: Cardiovascular;  Laterality: Bilateral;  ? UPPER GASTROINTESTINAL ENDOSCOPY  last one 07-23-2019  dr Henrene Pastor  ? ? ?Social History  ? ?Socioeconomic History  ? Marital status: Widowed  ?  Spouse name: Not on file  ? Number of children: 1  ? Years of education: Not on file  ? Highest education level: Not on file  ?Occupational History  ? Not on file  ?Tobacco Use  ? Smoking status: Former  ?  Packs/day: 0.50  ?  Years: 25.00  ?  Pack years: 12.50  ?  Types: Cigarettes  ?  Start date: 96  ?  Quit date: 06/20/2020  ?  Years since quitting:  1.2  ? Smokeless tobacco: Never  ? Tobacco comments:  ?  Started smoking at age [age]  ?Vaping Use  ? Vaping Use: Never used  ?Substance and Sexual Activity  ? Alcohol use: No  ?  Alcohol/week: 0.0 standard drinks  ? Drug use: No  ? Sexual activity: Not Currently  ?Other Topics Concern  ? Not on file  ?Social History Narrative  ?  Education 12th grade.  Caffeine 4-5 drinks daily.  ? Lives alone  ? ?Social Determinants of Health  ? ?Financial Resource Strain: Not on file  ?Food Insecurity: Not on file  ?Transportation Needs: Not on file  ?Physical Activity: Not on file  ?Stress: Not on file  ?Social Connections: Not on file  ?Intimate Partner Violence: Not on file  ?  ? ?Allergies  ?Allergen Reactions  ? Mirtazapine Hives and Rash  ? Clindamycin Hives  ? Macrobid [Nitrofurantoin] Hives  ? Other Hives  ?  Tylox   ? Sulfonamide Derivatives Hives  ? Alendronate Sodium Itching  ? Lipitor [Atorvastatin] Other (See Comments)  ?  MUSCLE ACHES  ? Restasis [Cyclosporine]   ?  Pt stated, "My eyes turned red on the inside and outside of eye; burning sensation"  ? Sulfa Antibiotics Hives  ? Topamax [Topiramate] Itching  ?  ? ?Outpatient Medications Prior to Visit  ?Medication Sig Dispense Refill  ? ACCU-CHEK COMPACT PLUS test strip 1 each by Other route as needed.     ? ACCU-CHEK SOFTCLIX  LANCETS lancets     ? acetaminophen (TYLENOL) 500 MG tablet Take 1,000 mg by mouth every 6 (six) hours as needed for moderate pain.    ? albuterol (PROVENTIL) (2.5 MG/3ML) 0.083% nebulizer solution Take 3 mLs (2

## 2021-09-08 ENCOUNTER — Encounter: Payer: Self-pay | Admitting: Pulmonary Disease

## 2021-09-08 LAB — RESPIRATORY ALLERGY PROFILE REGION II ~~LOC~~

## 2021-09-08 LAB — INTERPRETATION:

## 2021-09-08 NOTE — Telephone Encounter (Signed)
Dr. Icard, please see mychart message sent by pt and advise. ?

## 2021-09-25 DIAGNOSIS — M810 Age-related osteoporosis without current pathological fracture: Secondary | ICD-10-CM | POA: Diagnosis not present

## 2021-09-25 DIAGNOSIS — K219 Gastro-esophageal reflux disease without esophagitis: Secondary | ICD-10-CM | POA: Diagnosis not present

## 2021-09-25 DIAGNOSIS — E559 Vitamin D deficiency, unspecified: Secondary | ICD-10-CM | POA: Diagnosis not present

## 2021-09-25 DIAGNOSIS — I10 Essential (primary) hypertension: Secondary | ICD-10-CM | POA: Diagnosis not present

## 2021-09-25 DIAGNOSIS — I2510 Atherosclerotic heart disease of native coronary artery without angina pectoris: Secondary | ICD-10-CM | POA: Diagnosis not present

## 2021-09-25 DIAGNOSIS — I00 Rheumatic fever without heart involvement: Secondary | ICD-10-CM | POA: Diagnosis not present

## 2021-09-25 DIAGNOSIS — Z7952 Long term (current) use of systemic steroids: Secondary | ICD-10-CM | POA: Diagnosis not present

## 2021-09-25 DIAGNOSIS — E039 Hypothyroidism, unspecified: Secondary | ICD-10-CM | POA: Diagnosis not present

## 2021-09-25 DIAGNOSIS — E1065 Type 1 diabetes mellitus with hyperglycemia: Secondary | ICD-10-CM | POA: Diagnosis not present

## 2021-10-04 ENCOUNTER — Ambulatory Visit: Payer: Medicare HMO | Admitting: Physician Assistant

## 2021-10-04 ENCOUNTER — Encounter: Payer: Self-pay | Admitting: Physician Assistant

## 2021-10-04 VITALS — BP 120/72 | HR 91 | Ht 59.0 in | Wt 165.8 lb

## 2021-10-04 DIAGNOSIS — R0789 Other chest pain: Secondary | ICD-10-CM | POA: Diagnosis not present

## 2021-10-04 DIAGNOSIS — E785 Hyperlipidemia, unspecified: Secondary | ICD-10-CM

## 2021-10-04 DIAGNOSIS — I739 Peripheral vascular disease, unspecified: Secondary | ICD-10-CM

## 2021-10-04 DIAGNOSIS — E876 Hypokalemia: Secondary | ICD-10-CM | POA: Diagnosis not present

## 2021-10-04 DIAGNOSIS — I10 Essential (primary) hypertension: Secondary | ICD-10-CM | POA: Diagnosis not present

## 2021-10-04 DIAGNOSIS — E875 Hyperkalemia: Secondary | ICD-10-CM

## 2021-10-04 NOTE — Patient Instructions (Signed)
Medication Instructions:  ?Your physician recommends that you continue on your current medications as directed. Please refer to the Current Medication list given to you today. ? ?*If you need a refill on your cardiac medications before your next appointment, please call your pharmacy* ? ?Lab Work: ?Your physician recommends that you return for lab work TODAY:  ?BMET ? ?If you have labs (blood work) drawn today and your tests are completely normal, you will receive your results only by: ?MyChart Message (if you have MyChart) OR ?A paper copy in the mail ?If you have any lab test that is abnormal or we need to change your treatment, we will call you to review the results. ? ? ?Testing/Procedures: ?NONE ordered at this time of appointment  ? ?Follow-Up: ?At [facility], you and your health needs are our priority.  As part of our continuing mission to provide you with exceptional heart care, we have created designated Provider Care Teams.  These Care Teams include your primary Cardiologist (physician) and Advanced Practice Providers (APPs -  Physician Assistants and Nurse Practitioners) who all work together to provide you with the care you need, when you need it. ?  ? ?Your next appointment:   ?3-4 month(s) ? ?The format for your next appointment:   ?In Person ? ?Provider:   ?Quay Burow, MD   ? ?Other Instructions ? ? ?Important Information About Sugar ? ? ? ? ? ? ?

## 2021-10-04 NOTE — Progress Notes (Signed)
?Cardiology Office Note:   ? ?Date:  10/06/2021  ? ?ID:  [name], DOB [birth year], MRN [id number] ? ?PCP:  Prince Solian, MD ?  ?[facility] Providers ?Cardiologist:  Quay Burow, MD    ? ?Referring MD: Prince Solian, MD  ? ?Chief Complaint  ?Patient presents with  ? Follow-up  ?  Seen for Dr. Gwenlyn Found  ? ? ?History of Present Illness:   ? ?Ashley [name] is a 56 y.o. female with a hx of COPD, GERD, Graves' disease s/p RAI in 2011, history of H. pylori infection s/p treatment, hypertension, hyperlipidemia, type 1 diabetes mellitus and PAD.  Her father died of MI at age 73.  She herself has never had a heart attack or stroke.  She has been on insulin pump for more than 20 years.  Patient was previously referred to Dr. Gwenlyn Found due to lifestyle limiting claudication symptom.  Doppler performed in July 2022 revealed a right ABI 0.64, left ABI 0.55, she had high-grade bilateral iliac disease.  She underwent peripheral angiography on 01/01/2019 revealing high-grade distal abdominal aortic stenosis, he underwent bilateral PTA and covered stenting with kissing stents.  Afterward, her claudication symptoms resolved.  She was hospitalized with COVID-19 pneumonia in 2020-07-28 and was on ventilator for 9 days.  She has since stopped smoking.  Patient was last seen by Dr. Gwenlyn Found on 02/01/2021 at which time she continued to do well.  Last lower extremity Doppler obtained on 01/17/2021 showed normal ABI bilaterally.  Since then, she has been followed by Dr. Valeta Harms of pulmonology service and was last seen on 09/07/2021.   ? ?Patient presents today for posthospital follow-up.  Unfortunately her husband passed away in 07-28-2022.  She has been grieving along with her son.  She was admitted with sepsis and kidney stones in February and March.  She underwent multiple repeat ureteral stent placement by urology service.  Hospital course was complicated by significant hypokalemia.  Last potassium on the day of discharge on  08/16/2021 continue to show very low potassium of 2.9.  She has not had any blood work since then.  We will repeat a basic metabolic panel.  She has been having some chest discomfort, however this occurs more so with anxiety.  She has increased anxiety when leaving her house with hand shaking and chest discomfort.  I will hold off on ischemic work-up.  She is planning to get some help with counseling for grieving for both her and her son.  She will let us know if her chest discomfort increasing frequency or duration or occurs more so with physical exertion.  She also has been noticing lower back pain and left groin pain.  She says that this is the same symptoms she had years ago when she had significant distal aorta and bilateral iliac stenosis.  Symptom improved after undergoing lower extremity angiography in 2020 and had bilateral iliac stenting.  She is due for ABI with LEA in August, I plan to move up to study to next month.  She can follow-up with Dr. Gwenlyn Found in August. ? ? ?Past Medical History:  ?Diagnosis Date  ? Anxiety   ? COPD (chronic obstructive pulmonary disease) ([place])   ? Depression   ? DOE (dyspnea on exertion)   ? Dyspnea   ? Gastroparesis   ? GERD (gastroesophageal reflux disease)   ? Glaucoma, both eyes   ? History of 2019 novel coronavirus disease (COVID-19) 06/20/2020  ? hospital admission-- dx severe covid with pneumonia, ARDS,  DKA, COPD exaberation;  pt intubated 01-13th and extubated 01-20th;   (11-15-2020  per pt did not go home on oxygen, residual generalized weakness and hair loss)  ? History of esophageal stricture   ? post dilatation, followed by dr Henrene Pastor  ? History of Graves' disease   ? s/p RAI  03-03-2010  ? History of Helicobacter pylori infection   ? remote hx and treatment  ? History of kidney stones   ? Hyperlipidemia   ? Hypertension   ? followed by pcp  ? Hypothyroidism, postradioiodine therapy 02/2010  ? followed by pcp  ? IDA (iron deficiency anemia)   ? Insulin pump in place   ?  w/ Novolog , managed by pcp  ? Moderate asthma   ? followed by pcp,  (11-15-2020  per pt has pulmonology appt on 11-17-2020))  ? OSA (obstructive sleep apnea)   ? 11-15-2020  per pt has not used cpap since 05/ 2021  (previous followed by dr dohmeier,  study in epic 05-12-2018 moderate complex osa pt had used for awhile)  ? Osteoporosis   ? PAD (peripheral artery disease) ([place])   ? followed by dr berry----  s/p bilateral common iliac artery angioplasty stenting for stenosis  ? Peripheral neuropathy   ? Pneumonia   ? covid 2022  ? PONV (postoperative nausea and vomiting)   ? Rheumatoid arthritis ([place])   ? in both eyes  ? Right wrist fracture   ? Type 1 diabetes mellitus with long-term current use of insulin ([place])   ? per pt dx at age 73 approx,  followed by pcp,  (11-15-2020  per pt last A1c 7.2 in 04/ 2022),  checks blood sugar 6 to 10 times daily , fasting sugar-- 200 -- 300)  ? Vitamin D deficiency   ? ? ?Past Surgical History:  ?Procedure Laterality Date  ? ABDOMINAL AORTOGRAM W/LOWER EXTREMITY Bilateral 01/01/2019  ? Procedure: ABDOMINAL AORTOGRAM W/LOWER EXTREMITY;  Surgeon: Lorretta Harp, MD;  Location: [facility];  Service: Cardiovascular;  Laterality: Bilateral;  ? BREAST EXCISIONAL BIOPSY Left 04-*02-2000  '@[facility]'   ? per pt benign  ? BREAST SURGERY    ? CATARACT EXTRACTION W/ INTRAOCULAR LENS  IMPLANT, BILATERAL  11/2019  ? COLONOSCOPY  last one 2019 approx  ? CYSTOSCOPY WITH STENT PLACEMENT Right 07/22/2021  ? Procedure: CYSTOSCOPY WITH STENT PLACEMENT;  Surgeon: Billey Co, MD;  Location: [facility];  Service: Urology;  Laterality: Right;  ? CYSTOSCOPY/URETEROSCOPY/HOLMIUM LASER/STENT PLACEMENT Right 08/11/2021  ? Procedure: CYSTOSCOPY/URETEROSCOPY/HOLMIUM LASER/STENT EXCHANGE;  Surgeon: Billey Co, MD;  Location: [facility];  Service: Urology;  Laterality: Right;  ? FRACTURE SURGERY    ? LAPAROSCOPIC ASSISTED VAGINAL HYSTERECTOMY  06-22-2004 [facility]'   ? NASAL SEPTOPLASTY W/ TURBINOPLASTY  Bilateral 09/07/2013  ? Procedure: SEPTOPLASTY, BILATERAL TURBINATE RESECTION ;  Surgeon: Ascencion Dike, MD;  Location: [facility];  Service: ENT;  Laterality: Bilateral;  ? OPEN REDUCTION INTERNAL FIXATION (ORIF) DISTAL RADIAL FRACTURE Right 11/16/2020  ? Procedure: OPEN REDUCTION INTERNAL FIXATION (ORIF) DISTAL RADIAL FRACTURE;  Surgeon: Hiram Gash, MD;  Location: [facility];  Service: Orthopedics;  Laterality: Right;  ? PERIPHERAL VASCULAR INTERVENTION Bilateral 01/01/2019  ? Procedure: PERIPHERAL VASCULAR INTERVENTION;  Surgeon: Lorretta Harp, MD;  Location: [facility];  Service: Cardiovascular;  Laterality: Bilateral;  ? UPPER GASTROINTESTINAL ENDOSCOPY  last one 07-23-2019  dr Henrene Pastor  ? ? ?Current Medications: ?Current Meds  ?Medication Sig  ? ACCU-CHEK COMPACT PLUS  test strip 1 each by Other route as needed.   ? ACCU-CHEK SOFTCLIX LANCETS lancets   ? acetaminophen (TYLENOL) 500 MG tablet Take 1,000 mg by mouth every 6 (six) hours as needed for moderate pain.  ? albuterol (PROVENTIL) (2.5 MG/3ML) 0.083% nebulizer solution Take 3 mLs (2.5 mg total) by nebulization every 4 (four) hours as needed for wheezing or shortness of breath.  ? Albuterol Sulfate, sensor, (PROAIR DIGIHALER) 108 (90 Base) MCG/ACT AEPB Inhale 2 puffs into the lungs every 6 (six) hours as needed (shortness of breath).  ? ALPRAZolam (XANAX) 0.5 MG tablet Take 0.5-1 mg by mouth 3 (three) times daily as needed for anxiety.  ? AMBULATORY NON FORMULARY MEDICATION NOVOLOG INSULIN PUMP ?USES AS DIRECTED  ? aspirin EC 81 MG tablet Take 81 mg by mouth daily. Swallow whole.  ? Biotin w/ Vitamins C & E (HAIR/SKIN/NAILS PO) Take 1 capsule by mouth at bedtime.  ? Blood Glucose Monitoring Suppl (ACCU-CHEK AVIVA PLUS) w/Device KIT   ? Budeson-Glycopyrrol-Formoterol (BREZTRI AEROSPHERE) 160-9-4.8 MCG/ACT AERO Inhale 2 puffs into the lungs in the morning and at bedtime.  ? budesonide (PULMICORT) 0.25 MG/2ML nebulizer  solution Take 2 mLs (0.25 mg total) by nebulization 2 (two) times daily as needed (wheezing/dyspnea).  ? clopidogrel (PLAVIX) 75 MG tablet TAKE 1 TABLET EVERY DAY WITH BREAKFAST  ? DULoxetine (CYMBALTA) 30 MG capsu

## 2021-10-05 ENCOUNTER — Ambulatory Visit: Payer: Medicare HMO | Admitting: Adult Health

## 2021-10-05 LAB — BASIC METABOLIC PANEL
BUN/Creatinine Ratio: 16 (ref 9–23)
BUN: 14 mg/dL (ref 6–24)
CO2: 19 mmol/L — ABNORMAL LOW (ref 20–29)
Calcium: 9.2 mg/dL (ref 8.7–10.2)
Chloride: 100 mmol/L (ref 96–106)
Creatinine, Ser: 0.87 mg/dL (ref 0.57–1.00)
Glucose: 276 mg/dL — ABNORMAL HIGH (ref 70–99)
Potassium: 4.5 mmol/L (ref 3.5–5.2)
Sodium: 137 mmol/L (ref 134–144)
eGFR: 78 mL/min/{1.73_m2} (ref >59)

## 2021-10-05 NOTE — Progress Notes (Signed)
Kidney function normal. Potassium normalized.

## 2021-10-06 ENCOUNTER — Ambulatory Visit: Payer: Medicare HMO | Admitting: Adult Health

## 2021-10-06 ENCOUNTER — Encounter: Payer: Self-pay | Admitting: Physician Assistant

## 2021-10-12 ENCOUNTER — Encounter: Payer: Self-pay | Admitting: Adult Health

## 2021-10-12 ENCOUNTER — Ambulatory Visit: Payer: Medicare HMO | Admitting: Adult Health

## 2021-10-12 DIAGNOSIS — G4733 Obstructive sleep apnea (adult) (pediatric): Secondary | ICD-10-CM | POA: Diagnosis not present

## 2021-10-12 DIAGNOSIS — Z9989 Dependence on other enabling machines and devices: Secondary | ICD-10-CM

## 2021-10-12 DIAGNOSIS — F419 Anxiety disorder, unspecified: Secondary | ICD-10-CM | POA: Insufficient documentation

## 2021-10-12 DIAGNOSIS — R490 Dysphonia: Secondary | ICD-10-CM | POA: Diagnosis not present

## 2021-10-12 DIAGNOSIS — E669 Obesity, unspecified: Secondary | ICD-10-CM | POA: Diagnosis not present

## 2021-10-12 DIAGNOSIS — J449 Chronic obstructive pulmonary disease, unspecified: Secondary | ICD-10-CM | POA: Diagnosis not present

## 2021-10-12 NOTE — Assessment & Plan Note (Signed)
Moderate obstructive sleep apnea.  Patient has not been using her CPAP.  Have encouraged her to follow back up with neurology to discuss options for her sleep apnea.  She is symptomatic has a very fragmented sleep pattern and daytime sleepiness.  She is also has multiple comorbidities including type 1 diabetes.  Needs to have better control of her sleep apnea ?

## 2021-10-12 NOTE — Patient Instructions (Addendum)
Restart Pulmicort Neb Twice daily. Rinse after use.  ?Albuterol inhaler or Neb as needed  ?Activity as tolerated.  ?Refer to ENT for hoarseness.  ?Follow up with Dr. Valeta Harms 3 months and As needed   ? ? ?

## 2021-10-12 NOTE — Assessment & Plan Note (Signed)
Chronic hoarseness.  Questionable etiology.  This has been present since she was in the hospital January 2022 with vent dependent respiratory failure.-She is a former smoker.  Recommend referral to ENT. ?

## 2021-10-12 NOTE — Assessment & Plan Note (Signed)
Patient has significant anxiety, since the passing of her husband.  Encouraged her on ongoing counseling.  Follow-up with primary care to discuss her current regimen and persistent symptoms ?

## 2021-10-12 NOTE — Progress Notes (Signed)
? ?_0  ID: Ashley [name], female    DOB: Nov 02, 1965, 56 y.o.   MRN: [id number] ? ?Chief Complaint  ?Patient presents with  ? Follow-up  ? ? ?Referring provider: ?Prince Solian, MD ? ?HPI: ?56 year old female former smoker followed for COPD with asthma ?Medical history significant for GERD, hypertension, hyperlipidemia and gastroparesis, Type 1 DM-Insulin dependent. , RA on Methotrexate  ?COVID-19 infection and COVID-pneumonia in 06/28/2020 requiring hospitalization with acute respiratory failure requiring vent support.  She had a long recovery with oxygen.  Now has been weaned off of all oxygen ?OSA -not wearing CPAP . (Dr. Westley Hummer)  ? ?TEST/EVENTS :  ?PFTs February 02, 2021 showed normal lung function with FEV1 at 83%, ratio 87, FVC 75%, no significant bronchodilator response, DLCO 80%. ? ?Lung cancer CT chest February 08, 2021 showed mild emphysema, left upper lobe nodule measuring 1.9 mm, lung RADS 2 ? ?Allergy panel September 07, 2021 negative, IgE 6, eosinophils 200 ? ?10/12/2021 Follow up ; COPD with asthma ?Patient returns for a 1 month follow-up.  Patient has underlying COPD with mild emphysema on CT and asthma.  Patient had a long recovery from COVID-19 infection 2020/06/28 requiring hospitalization with vent support for acute respiratory failure and pneumonia.  Last visit patient was changed from Pulmicort nebulizer and Breo to [place].  Patient says she did not like Breztri at all.  Caused her breathing to be worse.  She is currently only using albuterol inhaler.  She says when she took Pulmicort nebulizer is seem to help.  She says overall she feels like her breathing is a little bit better.  She says she mainly gets short of breath when she is out in public.  She relates this some to anxiety.  Patient's husband passed away in 28-Jun-2022 with heart attack at home.  She had to do CPR.  She says since then she has been very anxious has panic attacks and does not want to be in crowds.  She is following  with her primary care provider.  And is currently on Zoloft and alprazolam.  She is also working with a [employer].  Patient also complains of chronic hoarseness since last year being in the hospital.  She says her voice is very scratchy and at times she just feels hoarse every day.  She denies any weight loss, hemoptysis, chest pain orthopnea. ? ? ?Allergies  ?Allergen Reactions  ? Mirtazapine Hives and Rash  ? Clindamycin Hives  ? Macrobid [Nitrofurantoin] Hives  ? Other Hives  ?  Tylox   ? Sulfonamide Derivatives Hives  ? Alendronate Sodium Itching  ? Fosamax [Alendronate]   ?  Other reaction(s): HIVES  ? Lipitor [Atorvastatin] Other (See Comments)  ?  MUSCLE ACHES  ? Restasis [Cyclosporine]   ?  Pt stated, "My eyes turned red on the inside and outside of eye; burning sensation"  ? Sulfa Antibiotics Hives  ? Topamax [Topiramate] Itching  ? ? ?Immunization History  ?Administered Date(s) Administered  ? Influenza Split 03/25/2002, 03/07/2011, 06/15/2011, 04/11/2012, 02/25/2013, 06/30/2013, 03/17/2014  ? Influenza, Quadrivalent, Recombinant, Inj, Pf 02/18/2018, 02/26/2019, 04/08/2020, 04/25/2021  ? Influenza-Unspecified 03/06/2017  ? Moderna Sars-Covid-2 Vaccination 05/03/2020, 05/19/2020, 01/21/2021  ? Pneumococcal Polysaccharide-23 03/25/2002, 02/25/2013, 02/16/2021  ? Tdap 2012-06-28  ? Zoster, Live 06/30/2013  ? ? ?Past Medical History:  ?Diagnosis Date  ? Anxiety   ? COPD (chronic obstructive pulmonary disease) ([place])   ? Depression   ? DOE (dyspnea on exertion)   ? Dyspnea   ?  Gastroparesis   ? GERD (gastroesophageal reflux disease)   ? Glaucoma, both eyes   ? History of 2019 novel coronavirus disease (COVID-19) 06/20/2020  ? hospital admission-- dx severe covid with pneumonia, ARDS, DKA, COPD exaberation;  pt intubated 01-13th and extubated 01-20th;   (11-15-2020  per pt did not go home on oxygen, residual generalized weakness and hair loss)  ? History of esophageal stricture   ? post dilatation, followed  by dr Henrene Pastor  ? History of Graves' disease   ? s/p RAI  03-03-2010  ? History of Helicobacter pylori infection   ? remote hx and treatment  ? History of kidney stones   ? Hyperlipidemia   ? Hypertension   ? followed by pcp  ? Hypothyroidism, postradioiodine therapy 02/2010  ? followed by pcp  ? IDA (iron deficiency anemia)   ? Insulin pump in place   ? w/ Novolog , managed by pcp  ? Moderate asthma   ? followed by pcp,  (11-15-2020  per pt has pulmonology appt on 11-17-2020))  ? OSA (obstructive sleep apnea)   ? 11-15-2020  per pt has not used cpap since 05/ 2021  (previous followed by dr dohmeier,  study in epic 05-12-2018 moderate complex osa pt had used for awhile)  ? Osteoporosis   ? PAD (peripheral artery disease) ([place])   ? followed by dr berry----  s/p bilateral common iliac artery angioplasty stenting for stenosis  ? Peripheral neuropathy   ? Pneumonia   ? covid 2022  ? PONV (postoperative nausea and vomiting)   ? Rheumatoid arthritis ([place])   ? in both eyes  ? Right wrist fracture   ? Type 1 diabetes mellitus with long-term current use of insulin ([place])   ? per pt dx at age 87 approx,  followed by pcp,  (11-15-2020  per pt last A1c 7.2 in 04/ 2022),  checks blood sugar 6 to 10 times daily , fasting sugar-- 200 -- 300)  ? Vitamin D deficiency   ? ? ?Tobacco History: ?Social History  ? ?Tobacco Use  ?Smoking Status Former  ? Packs/day: 0.50  ? Years: 25.00  ? Pack years: 12.50  ? Types: Cigarettes  ? Start date: 32  ? Quit date: 06/20/2020  ? Years since quitting: 1.3  ?Smokeless Tobacco Never  ?Tobacco Comments  ? Started smoking at age [age]  ? ?Counseling given: Not Answered ?Tobacco comments: Started smoking at age [age] ? ? ?Outpatient Medications Prior to Visit  ?Medication Sig Dispense Refill  ? ACCU-CHEK COMPACT PLUS test strip 1 each by Other route as needed.     ? ACCU-CHEK SOFTCLIX LANCETS lancets     ? acetaminophen (TYLENOL) 500 MG tablet Take 1,000 mg by mouth every 6 (six) hours as needed for  moderate pain.    ? albuterol (PROVENTIL) (2.5 MG/3ML) 0.083% nebulizer solution Take 3 mLs (2.5 mg total) by nebulization every 4 (four) hours as needed for wheezing or shortness of breath. 75 mL 3  ? Albuterol Sulfate, sensor, (PROAIR DIGIHALER) 108 (90 Base) MCG/ACT AEPB Inhale 2 puffs into the lungs every 6 (six) hours as needed (shortness of breath).    ? ALPRAZolam (XANAX) 0.5 MG tablet Take 0.5-1 mg by mouth 3 (three) times daily as needed for anxiety.    ? AMBULATORY NON FORMULARY MEDICATION NOVOLOG INSULIN PUMP ?USES AS DIRECTED    ? aspirin EC 81 MG tablet Take 81 mg by mouth daily. Swallow whole.    ? Biotin w/ Vitamins C &  E (HAIR/SKIN/NAILS PO) Take 1 capsule by mouth at bedtime.    ? Blood Glucose Monitoring Suppl (ACCU-CHEK AVIVA PLUS) w/Device KIT     ? budesonide (PULMICORT) 0.25 MG/2ML nebulizer solution Take 2 mLs (0.25 mg total) by nebulization 2 (two) times daily as needed (wheezing/dyspnea).    ? clopidogrel (PLAVIX) 75 MG tablet TAKE 1 TABLET EVERY DAY WITH BREAKFAST 90 tablet 3  ? DULoxetine (CYMBALTA) 30 MG capsule Take 30 mg by mouth every morning.  1  ? estradiol (ESTRACE) 1 MG tablet Take 1 mg by mouth at bedtime.    ? famotidine (PEPCID) 40 MG tablet Take 1 tablet (40 mg total) by mouth at bedtime. Office visit for further refills 90 tablet 0  ? folic acid (FOLVITE) 1 MG tablet Take 1 tablet by mouth daily.    ? Insulin Human (INSULIN PUMP) SOLN Inject into the skin. Novolog----  basal rate--- 12 am - 2 am = 1.3, 2 am - 6 am = 1.4, 6 am - 3 pm = 1.8, 3 pm - 12 am = 1.55    ? lansoprazole (PREVACID) 30 MG capsule Take 1 capsule (30 mg total) by mouth 2 (two) times daily before a meal. 180 capsule 1  ? latanoprost (XALATAN) 0.005 % ophthalmic solution Place 1 drop into both eyes at bedtime.  11  ? levocetirizine (XYZAL) 5 MG tablet Take 5 mg by mouth every morning.     ? levothyroxine (SYNTHROID) 125 MCG tablet Take 125 mcg by mouth daily before breakfast.    ? methotrexate (RHEUMATREX)  2.5 MG tablet Take 6 tablets (15 mg total) by mouth once a week. 3 tablets am and at bedtime twice a day once a week,  Saturday's    ? metoprolol succinate (TOPROL-XL) 50 MG 24 hr tablet Take 50 mg by mouth

## 2021-10-12 NOTE — Assessment & Plan Note (Signed)
Healthy weight loss discussed 

## 2021-10-12 NOTE — Assessment & Plan Note (Signed)
Mild COPD with asthma and emphysema.  PFT showed minimum restriction no airflow obstruction.  Patient had no perceived benefit with Breztri inhaler.  Have asked her to restart Pulmicort nebulizer twice daily.  She seems to have done the best with this.  May use albuterol inhaler or nebulizer as needed. ?Continue with yearly lung cancer CT screening program ? ?Plan  ?Patient Instructions  ?Restart Pulmicort Neb Twice daily. Rinse after use.  ?Albuterol inhaler or Neb as needed  ?Activity as tolerated.  ?Refer to ENT for hoarseness.  ?Follow up with Dr. Valeta Harms 3 months and As needed   ? ? ?  ? ?

## 2021-10-18 DIAGNOSIS — E785 Hyperlipidemia, unspecified: Secondary | ICD-10-CM | POA: Diagnosis not present

## 2021-10-18 DIAGNOSIS — Z79899 Other long term (current) drug therapy: Secondary | ICD-10-CM | POA: Diagnosis not present

## 2021-10-18 DIAGNOSIS — I10 Essential (primary) hypertension: Secondary | ICD-10-CM | POA: Diagnosis not present

## 2021-10-18 DIAGNOSIS — E0500 Thyrotoxicosis with diffuse goiter without thyrotoxic crisis or storm: Secondary | ICD-10-CM | POA: Diagnosis not present

## 2021-10-18 DIAGNOSIS — E1040 Type 1 diabetes mellitus with diabetic neuropathy, unspecified: Secondary | ICD-10-CM | POA: Diagnosis not present

## 2021-10-18 DIAGNOSIS — M810 Age-related osteoporosis without current pathological fracture: Secondary | ICD-10-CM | POA: Diagnosis not present

## 2021-10-19 DIAGNOSIS — E10649 Type 1 diabetes mellitus with hypoglycemia without coma: Secondary | ICD-10-CM | POA: Diagnosis not present

## 2021-10-25 DIAGNOSIS — I739 Peripheral vascular disease, unspecified: Secondary | ICD-10-CM | POA: Diagnosis not present

## 2021-10-25 DIAGNOSIS — E1040 Type 1 diabetes mellitus with diabetic neuropathy, unspecified: Secondary | ICD-10-CM | POA: Diagnosis not present

## 2021-10-25 DIAGNOSIS — Z0000 Encounter for general adult medical examination without abnormal findings: Secondary | ICD-10-CM | POA: Diagnosis not present

## 2021-10-25 DIAGNOSIS — F418 Other specified anxiety disorders: Secondary | ICD-10-CM | POA: Diagnosis not present

## 2021-10-25 DIAGNOSIS — Z23 Encounter for immunization: Secondary | ICD-10-CM | POA: Diagnosis not present

## 2021-10-25 DIAGNOSIS — I10 Essential (primary) hypertension: Secondary | ICD-10-CM | POA: Diagnosis not present

## 2021-10-25 DIAGNOSIS — K3184 Gastroparesis: Secondary | ICD-10-CM | POA: Diagnosis not present

## 2021-10-25 DIAGNOSIS — K219 Gastro-esophageal reflux disease without esophagitis: Secondary | ICD-10-CM | POA: Diagnosis not present

## 2021-10-25 DIAGNOSIS — R82998 Other abnormal findings in urine: Secondary | ICD-10-CM | POA: Diagnosis not present

## 2021-10-25 DIAGNOSIS — F4321 Adjustment disorder with depressed mood: Secondary | ICD-10-CM | POA: Diagnosis not present

## 2021-10-25 DIAGNOSIS — J439 Emphysema, unspecified: Secondary | ICD-10-CM | POA: Diagnosis not present

## 2021-10-30 ENCOUNTER — Telehealth: Payer: Self-pay | Admitting: Adult Health

## 2021-10-31 ENCOUNTER — Other Ambulatory Visit (HOSPITAL_COMMUNITY): Payer: Self-pay | Admitting: Cardiovascular Disease

## 2021-10-31 ENCOUNTER — Ambulatory Visit (HOSPITAL_BASED_OUTPATIENT_CLINIC_OR_DEPARTMENT_OTHER)
Admission: RE | Admit: 2021-10-31 | Discharge: 2021-10-31 | Disposition: A | Discharge status: Home / Self Care | Payer: Medicare HMO | Source: Ambulatory Visit | Attending: Cardiovascular Disease | Admitting: Cardiovascular Disease

## 2021-10-31 ENCOUNTER — Encounter (HOSPITAL_COMMUNITY): Payer: Medicare HMO

## 2021-10-31 ENCOUNTER — Ambulatory Visit (HOSPITAL_COMMUNITY)
Admission: RE | Admit: 2021-10-31 | Discharge: 2021-10-31 | Disposition: A | Discharge status: Home / Self Care | Payer: Medicare HMO | Source: Ambulatory Visit | Attending: Cardiovascular Disease | Admitting: Cardiovascular Disease

## 2021-10-31 DIAGNOSIS — I739 Peripheral vascular disease, unspecified: Secondary | ICD-10-CM

## 2021-10-31 NOTE — Telephone Encounter (Signed)
ATC patient in regards to ENT referral and the neb meds/machine but no voicemail picked up for patient. Line kept on ringing and ringing. Will try again

## 2021-11-24 DIAGNOSIS — H30033 Focal chorioretinal inflammation, peripheral, bilateral: Secondary | ICD-10-CM | POA: Diagnosis not present

## 2021-11-24 DIAGNOSIS — E109 Type 1 diabetes mellitus without complications: Secondary | ICD-10-CM | POA: Diagnosis not present

## 2021-11-24 DIAGNOSIS — Z961 Presence of intraocular lens: Secondary | ICD-10-CM | POA: Diagnosis not present

## 2021-11-24 DIAGNOSIS — Z79899 Other long term (current) drug therapy: Secondary | ICD-10-CM | POA: Diagnosis not present

## 2021-11-24 DIAGNOSIS — H3581 Retinal edema: Secondary | ICD-10-CM | POA: Diagnosis not present

## 2021-11-30 DIAGNOSIS — M25562 Pain in left knee: Secondary | ICD-10-CM | POA: Diagnosis not present

## 2021-12-06 NOTE — Telephone Encounter (Signed)
Attempted to call pt but unable to reach. Left message for her to return call. Due to multiple attempts trying to reach pt but have not been able to do so, encounter will be closed.

## 2021-12-13 DIAGNOSIS — E785 Hyperlipidemia, unspecified: Secondary | ICD-10-CM | POA: Diagnosis not present

## 2021-12-13 DIAGNOSIS — Z794 Long term (current) use of insulin: Secondary | ICD-10-CM | POA: Diagnosis not present

## 2021-12-13 DIAGNOSIS — Z4681 Encounter for fitting and adjustment of insulin pump: Secondary | ICD-10-CM | POA: Diagnosis not present

## 2021-12-13 DIAGNOSIS — E1040 Type 1 diabetes mellitus with diabetic neuropathy, unspecified: Secondary | ICD-10-CM | POA: Diagnosis not present

## 2021-12-13 DIAGNOSIS — I10 Essential (primary) hypertension: Secondary | ICD-10-CM | POA: Diagnosis not present

## 2021-12-19 ENCOUNTER — Encounter: Payer: Self-pay | Admitting: Cardiovascular Disease

## 2021-12-19 ENCOUNTER — Ambulatory Visit: Payer: Medicare HMO | Admitting: Cardiovascular Disease

## 2021-12-19 VITALS — BP 146/89 | HR 70 | Ht 59.0 in | Wt 163.0 lb

## 2021-12-19 DIAGNOSIS — I739 Peripheral vascular disease, unspecified: Secondary | ICD-10-CM

## 2021-12-19 DIAGNOSIS — Z720 Tobacco use: Secondary | ICD-10-CM

## 2021-12-19 DIAGNOSIS — I10 Essential (primary) hypertension: Secondary | ICD-10-CM

## 2021-12-19 DIAGNOSIS — R0789 Other chest pain: Secondary | ICD-10-CM | POA: Diagnosis not present

## 2021-12-19 DIAGNOSIS — E785 Hyperlipidemia, unspecified: Secondary | ICD-10-CM | POA: Diagnosis not present

## 2021-12-19 NOTE — Assessment & Plan Note (Signed)
History of hyperlipidemia on Praluent and statin therapy followed by her PCP

## 2021-12-19 NOTE — Assessment & Plan Note (Signed)
History of essential hypertension with blood pressure measured today at 146/89.  She is on metoprolol.

## 2021-12-19 NOTE — Patient Instructions (Signed)
Medication Instructions:  Your physician recommends that you continue on your current medications as directed. Please refer to the Current Medication list given to you today.  *If you need a refill on your cardiac medications before your next appointment, please call your pharmacy*   Testing/Procedures: Dr. Gwenlyn Found has ordered a CT coronary calcium score.   Test locations:  [place]   This is $99 out of pocket.   Coronary CalciumScan A coronary calcium scan is an imaging test used to look for deposits of calcium and other fatty materials (plaques) in the inner lining of the blood vessels of the heart (coronary arteries). These deposits of calcium and plaques can partly clog and narrow the coronary arteries without producing any symptoms or warning signs. This puts a person at risk for a heart attack. This test can detect these deposits before symptoms develop. Tell a health care provider about: Any allergies you have. All medicines you are taking, including vitamins, herbs, eye drops, creams, and over-the-counter medicines. Any problems you or family members have had with anesthetic medicines. Any blood disorders you have. Any surgeries you have had. Any medical conditions you have. Whether you are pregnant or may be pregnant. What are the risks? Generally, this is a safe procedure. However, problems may occur, including: Harm to a pregnant woman and her unborn baby. This test involves the use of radiation. Radiation exposure can be dangerous to a pregnant woman and her unborn baby. If you are pregnant, you generally should not have this procedure done. Slight increase in the risk of cancer. This is because of the radiation involved in the test. What happens before the procedure? No preparation is needed for this procedure. What happens during the procedure? You will undress and remove any jewelry around your neck or chest. You will put on a hospital  gown. Sticky electrodes will be placed on your chest. The electrodes will be connected to an electrocardiogram (ECG) machine to record a tracing of the electrical activity of your heart. A CT scanner will take pictures of your heart. During this time, you will be asked to lie still and hold your breath for 2-3 seconds while a picture of your heart is being taken. The procedure may vary among health care providers and hospitals. What happens after the procedure? You can get dressed. You can return to your normal activities. It is up to you to get the results of your test. Ask your health care provider, or the department that is doing the test, when your results will be ready. Summary A coronary calcium scan is an imaging test used to look for deposits of calcium and other fatty materials (plaques) in the inner lining of the blood vessels of the heart (coronary arteries). Generally, this is a safe procedure. Tell your health care provider if you are pregnant or may be pregnant. No preparation is needed for this procedure. A CT scanner will take pictures of your heart. You can return to your normal activities after the scan is done. This information is not intended to replace advice given to you by your health care provider. Make sure you discuss any questions you have with your health care provider. Document Released: 11/24/2007 Document Revised: 04/16/2016 Document Reviewed: 04/16/2016 Elsevier Interactive Patient Education  2017 [place] physician has requested that you have an Aorta/Iliac Duplex. This will be take place at [place], [address].   No food after 11PM the night before.  Water  is OK. (Don't drink liquids if you have been instructed not to for ANOTHER test) Avoid foods that produce bowel gas, for 24 hours prior to exam (see below). No breakfast, no chewing gum, no smoking or carbonated beverages. Patient may take morning medications with water. Come in for  test at least 15 minutes early to register.  Your physician has requested that you have a lower extremity arterial duplex. This test is an ultrasound of the arteries in the legs. It looks at arterial blood flow in the legs. Allow one hour for Lower Arterial scans. There are no restrictions or special instructions  Your physician has requested that you have an ankle brachial index (ABI). During this test an ultrasound and blood pressure cuff are used to evaluate the arteries that supply the arms and legs with blood. Allow thirty minutes for this exam. There are no restrictions or special instructions. To be done in May 2024.   Follow-Up: At [facility], you and your health needs are our priority.  As part of our continuing mission to provide you with exceptional heart care, we have created designated Provider Care Teams.  These Care Teams include your primary Cardiologist (physician) and Advanced Practice Providers (APPs -  Physician Assistants and Nurse Practitioners) who all work together to provide you with the care you need, when you need it.  We recommend signing up for the patient portal called "MyChart".  Sign up information is provided on this After Visit Summary.  MyChart is used to connect with patients for Virtual Visits (Telemedicine).  Patients are able to view lab/test results, encounter notes, upcoming appointments, etc.  Non-urgent messages can be sent to your provider as well.   To learn more about what you can do with MyChart, go to [email or web address].    Your next appointment:   6 month(s)  The format for your next appointment:   In Person  Provider:   Quay Burow, MD

## 2021-12-19 NOTE — Assessment & Plan Note (Signed)
History of PAD status post bilateral common iliac artery PTA and stenting by myself 01/01/2019 for high-grade distal abdominal aortic stenosis.  Her Dopplers improved and her claudication resolved at that time.  She now complains of some hip and leg discomfort although her Doppler studies performed 10/31/2021 revealed a right ABI of 1.07 and a left of 0.91 with moderately elevated velocities in the iliac arteries bilaterally.  These have remained stable.  I am going to continue to follow her by duplex ultrasound.  Should these get worse and/or her symptoms get worse I would consider we angiography.

## 2021-12-19 NOTE — Assessment & Plan Note (Signed)
History of atypical chest pain which occurs on a daily basis.  She does have positive risk factors including treated PAD.  I am going to get a coronary calcium score to further evaluate.

## 2021-12-19 NOTE — Assessment & Plan Note (Signed)
Discontinued

## 2021-12-19 NOTE — Progress Notes (Signed)
12/19/2021 Ashley [name]   [birth year]  [id number]  Primary Physician Prince Solian, MD Primary Cardiologist: Lorretta Harp MD Ashley [name], [place]  HPI:  Ashley [name] is a 56 y.o.  moderately overweight married Caucasian female mother of 1 son, grandmother to one grandchild referred by Dr. Paulla Dolly for peripheral vascular valuation because of poorly palpable pulses and leg pain.  I last saw her in the office 01/28/2021.  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  She has a history of ongoing tobacco abuse of 4 cigarettes a day, treated hypertension, diabetes and hyperlipidemia.  Her father died of a myocardial infarction at age 63.  She is never had a heart attack or stroke and denies chest pain or shortness of breath.  She has been on insulin pump for last 26 years.  She has had bilateral lower extremity lifestyle and claudication for last 2 years prior to her intervention treated as orthopedic or neurovascular pain, with recent lower extremity Dopplers performed in our office 12/16/2018 revealing a right ABI 0.64 and a left 0.55.  She had high-grade bilateral iliac disease.   I performed peripheral angiography on her 01/01/2019 revealing high-grade distal abdominal aortic stenosis.  I performed bilateral PTA and covered stenting using "kissing stent technique and rebuilding her aortic bifurcation.  Her follow-up Dopplers normalized and her claudication has resolved.  She does have some swelling in her lower extremities potentially related to the increased blood flow as result of her intervention.  I am going to put her on low-dose diuretic and decrease her amlodipine dose.  She remains on dual antiplatelet therapy.     She was hospitalized in Jun 23, 2022 of this year with COVID-pneumonia and was on the ventilator for 9 days.  She has since stopped smoking.  She is also gained close to 20 pounds for unclear reasons.  She has complained of some back  and left leg pain however lower extremity arterial Doppler studies have remained fairly stable.  Since I saw her a year ago her husband of 53 years unfortunately died in 23-Jun-2022 of a myocardial infarction.  She complains of atypical chest pain on a daily basis which she attributes to anxiety.  She really never had this prior to his passing.  She also complains of some hip and back discomfort although her Doppler studies have remained stable.   Current Meds  Medication Sig   ACCU-CHEK COMPACT PLUS test strip 1 each by Other route as needed.    ACCU-CHEK SOFTCLIX LANCETS lancets    acetaminophen (TYLENOL) 500 MG tablet Take 1,000 mg by mouth every 6 (six) hours as needed for moderate pain.   albuterol (PROVENTIL) (2.5 MG/3ML) 0.083% nebulizer solution Take 3 mLs (2.5 mg total) by nebulization every 4 (four) hours as needed for wheezing or shortness of breath.   Albuterol Sulfate, sensor, (PROAIR DIGIHALER) 108 (90 Base) MCG/ACT AEPB Inhale 2 puffs into the lungs every 6 (six) hours as needed (shortness of breath).   ALPRAZolam (XANAX) 0.5 MG tablet Take 0.5-1 mg by mouth 3 (three) times daily as needed for anxiety.   AMBULATORY NON FORMULARY MEDICATION NOVOLOG INSULIN PUMP USES AS DIRECTED   aspirin EC 81 MG tablet Take 81 mg by mouth daily. Swallow whole.   Biotin w/ Vitamins C & E (HAIR/SKIN/NAILS PO) Take 1 capsule by mouth at bedtime.   Blood Glucose Monitoring Suppl (ACCU-CHEK AVIVA PLUS) w/Device KIT  clopidogrel (PLAVIX) 75 MG tablet TAKE 1 TABLET EVERY DAY WITH BREAKFAST   estradiol (ESTRACE) 1 MG tablet Take 1 mg by mouth at bedtime.   famotidine (PEPCID) 40 MG tablet Take 1 tablet (40 mg total) by mouth at bedtime. Office visit for further refills   folic acid (FOLVITE) 1 MG tablet Take 1 tablet by mouth daily.   Insulin Human (INSULIN PUMP) SOLN Inject into the skin. Novolog----  basal rate--- 12 am - 2 am = 1.3, 2 am - 6 am = 1.4, 6 am - 3 pm = 1.8, 3 pm - 12 am = 1.55    lansoprazole (PREVACID) 30 MG capsule Take 1 capsule (30 mg total) by mouth 2 (two) times daily before a meal.   latanoprost (XALATAN) 0.005 % ophthalmic solution Place 1 drop into both eyes at bedtime.   levocetirizine (XYZAL) 5 MG tablet Take 5 mg by mouth every morning.    levothyroxine (SYNTHROID) 125 MCG tablet Take 125 mcg by mouth daily before breakfast.   methotrexate (RHEUMATREX) 2.5 MG tablet Take 6 tablets (15 mg total) by mouth once a week. 3 tablets am and at bedtime twice a day once a week,  Saturday's   metoprolol succinate (TOPROL-XL) 50 MG 24 hr tablet Take 50 mg by mouth daily. Take with or immediately following a meal.   montelukast (SINGULAIR) 10 MG tablet Take 10 mg by mouth at bedtime.   NOVOLOG 100 UNIT/ML injection Inject into the skin See admin instructions. Pt has Insulin pump   Oyster Shell Calcium 500 MG TABS 1 tablet with meals   pregabalin (LYRICA) 75 MG capsule Take 75-150 mg by mouth See admin instructions. Take 75 mg in the morning, 75 mg at lunch, and 150 mg at bedtime   rosuvastatin (CRESTOR) 20 MG tablet Take 20 mg by mouth at bedtime.   sertraline (ZOLOFT) 100 MG tablet Take 100 mg by mouth daily.   Vitamin D, Ergocalciferol, (DRISDOL) 50000 UNITS CAPS Take 50,000 Units by mouth every Saturday.     Allergies  Allergen Reactions   Mirtazapine Hives and Rash   Clindamycin Hives   Macrobid [Nitrofurantoin] Hives   Other Hives    Tylox    Sulfonamide Derivatives Hives   Alendronate Sodium Itching   Fosamax [Alendronate]     Other reaction(s): HIVES   Lipitor [Atorvastatin] Other (See Comments)    MUSCLE ACHES   Restasis [Cyclosporine]     Pt stated, "My eyes turned red on the inside and outside of eye; burning sensation"   Sulfa Antibiotics Hives   Topamax [Topiramate] Itching    Social History   Socioeconomic History   Marital status: Widowed    Spouse name: Not on file   Number of children: 1   Years of education: Not on file   Highest  education level: Not on file  Occupational History   Not on file  Tobacco Use   Smoking status: Former    Packs/day: 0.50    Years: 25.00    Total pack years: 12.50    Types: Cigarettes    Start date: 57    Quit date: 06/20/2020    Years since quitting: 1.4   Smokeless tobacco: Never   Tobacco comments:    Started smoking at age [age]  Vaping Use   Vaping Use: Never used  Substance and Sexual Activity   Alcohol use: No    Alcohol/week: 0.0 standard drinks of alcohol   Drug use: No   Sexual activity: Not  Currently  Other Topics Concern   Not on file  Social History Narrative    Education 12th grade.  Caffeine 4-5 drinks daily.   Lives alone   Social Determinants of Health   Financial Resource Strain: Not on file  Food Insecurity: Not on file  Transportation Needs: Not on file  Physical Activity: Not on file  Stress: Not on file  Social Connections: Not on file  Intimate Partner Violence: Not on file     Review of Systems: General: negative for chills, fever, night sweats or weight changes.  Cardiovascular: negative for chest pain, dyspnea on exertion, edema, orthopnea, palpitations, paroxysmal nocturnal dyspnea or shortness of breath Dermatological: negative for rash Respiratory: negative for cough or wheezing Urologic: negative for hematuria Abdominal: negative for nausea, vomiting, diarrhea, bright red blood per rectum, melena, or hematemesis Neurologic: negative for visual changes, syncope, or dizziness All other systems reviewed and are otherwise negative except as noted above.    Blood pressure (!) 146/89, pulse 70, height _0  (1.499 m), weight 163 lb (73.9 kg), SpO2 100 %.  General appearance: alert and no distress Neck: no adenopathy, no carotid bruit, no JVD, supple, symmetrical, trachea midline, and thyroid not enlarged, symmetric, no tenderness/mass/nodules Lungs: clear to auscultation bilaterally Heart: regular rate and rhythm, S1, S2 normal, no  murmur, click, rub or gallop Extremities: extremities normal, atraumatic, no cyanosis or edema Pulses: 2+ and symmetric Skin: Skin color, texture, turgor normal. No rashes or lesions Neurologic: Grossly normal  EKG sinus rhythm at 70 without ST or T wave changes.  Personally reviewed this EKG.  ASSESSMENT AND PLAN:   Chest tightness History of atypical chest pain which occurs on a daily basis.  She does have positive risk factors including treated PAD.  I am going to get a coronary calcium score to further evaluate.  Essential hypertension History of essential hypertension with blood pressure measured today at 146/89.  She is on metoprolol.  Hyperlipidemia History of hyperlipidemia on Praluent and statin therapy followed by her PCP  Peripheral arterial disease ([place]) History of PAD status post bilateral common iliac artery PTA and stenting by myself 01/01/2019 for high-grade distal abdominal aortic stenosis.  Her Dopplers improved and her claudication resolved at that time.  She now complains of some hip and leg discomfort although her Doppler studies performed 10/31/2021 revealed a right ABI of 1.07 and a left of 0.91 with moderately elevated velocities in the iliac arteries bilaterally.  These have remained stable.  I am going to continue to follow her by duplex ultrasound.  Should these get worse and/or her symptoms get worse I would consider we angiography.  Tobacco abuse Discontinued     Lorretta Harp MD [facility], [facility] 12/19/2021 11:54 AM

## 2021-12-27 NOTE — Progress Notes (Deleted)
PATIENT: ANALI CABANILLA DOB: Mar 17, 1966  REASON FOR VISIT: follow up HISTORY FROM: patient  No chief complaint on file.    HISTORY OF PRESENT ILLNESS:  12/27/21 ALL: Janaiyah returns for follow up for OSa on CPAP. She was last seen 12/2018.   12/11/2018 ALL:  NORMAJEAN NASH is a 56 y.o. female here today for follow up of OSA on CPAP.  She reports that she is doing better.  She did get a new mask and headgear.  She feels that this is helped with her leak.  She still feels tired.  Compliance data dated 11/10/2018 through 12/09/2018 reveals that she is using her CPAP machine 30 out of the last 30 days for compliance of 100%.  23 of those days were used greater than 4 hours for compliance of 77%.  AHI was 2.6 on 6 to 15 cm of water and EPR of 3.  There was no significant leak.  She returns today for evaluation  HISTORY: (copied from my note on 10/08/2018)  YITTEL EMRICH is a 56 y.o. female for follow up of OSA on CPAP.  Maeleigh reports that she is doing well on CPAP therapy.  She is continuing to get adjusted to using her machine every night.  She states that she uses it every night but does sometimes pull the machine off in the middle the night.  There are days that she does not get the complete 4-hour therapy recommended.  She has noticed that her mask leaks air around the nasal bridge.  She is using a fullface mask.  She feels that the head piece is a little flimsy and that maybe she could tighten this up.  Otherwise she is doing well without complaints.  REVIEW OF SYSTEMS: Out of a complete 14 system review of symptoms, the patient complains only of the following symptoms, headache and all other reviewed systems are negative.  Epworth sleepiness scale: 25 Fatigue severity scale: 28  ALLERGIES: Allergies  Allergen Reactions   Mirtazapine Hives and Rash   Clindamycin Hives   Macrobid [Nitrofurantoin] Hives   Other Hives    Tylox    Sulfonamide Derivatives Hives   Alendronate  Sodium Itching   Fosamax [Alendronate]     Other reaction(s): HIVES   Lipitor [Atorvastatin] Other (See Comments)    MUSCLE ACHES   Restasis [Cyclosporine]     Pt stated, "My eyes turned red on the inside and outside of eye; burning sensation"   Sulfa Antibiotics Hives   Topamax [Topiramate] Itching    HOME MEDICATIONS: Outpatient Medications Prior to Visit  Medication Sig Dispense Refill   ACCU-CHEK COMPACT PLUS test strip 1 each by Other route as needed.      ACCU-CHEK SOFTCLIX LANCETS lancets      acetaminophen (TYLENOL) 500 MG tablet Take 1,000 mg by mouth every 6 (six) hours as needed for moderate pain.     albuterol (PROVENTIL) (2.5 MG/3ML) 0.083% nebulizer solution Take 3 mLs (2.5 mg total) by nebulization every 4 (four) hours as needed for wheezing or shortness of breath. 75 mL 3   Albuterol Sulfate, sensor, (PROAIR DIGIHALER) 108 (90 Base) MCG/ACT AEPB Inhale 2 puffs into the lungs every 6 (six) hours as needed (shortness of breath).     ALPRAZolam (XANAX) 0.5 MG tablet Take 0.5-1 mg by mouth 3 (three) times daily as needed for anxiety.     AMBULATORY NON FORMULARY MEDICATION NOVOLOG INSULIN PUMP USES AS DIRECTED     aspirin EC 81  MG tablet Take 81 mg by mouth daily. Swallow whole.     Biotin w/ Vitamins C & E (HAIR/SKIN/NAILS PO) Take 1 capsule by mouth at bedtime.     Blood Glucose Monitoring Suppl (ACCU-CHEK AVIVA PLUS) w/Device KIT      budesonide (PULMICORT) 0.25 MG/2ML nebulizer solution Take 2 mLs (0.25 mg total) by nebulization 2 (two) times daily as needed (wheezing/dyspnea).     clopidogrel (PLAVIX) 75 MG tablet TAKE 1 TABLET EVERY DAY WITH BREAKFAST 90 tablet 3   estradiol (ESTRACE) 1 MG tablet Take 1 mg by mouth at bedtime.     famotidine (PEPCID) 40 MG tablet Take 1 tablet (40 mg total) by mouth at bedtime. Office visit for further refills 90 tablet 0   folic acid (FOLVITE) 1 MG tablet Take 1 tablet by mouth daily.     Insulin Human (INSULIN PUMP) SOLN Inject into  the skin. Novolog----  basal rate--- 12 am - 2 am = 1.3, 2 am - 6 am = 1.4, 6 am - 3 pm = 1.8, 3 pm - 12 am = 1.55     lansoprazole (PREVACID) 30 MG capsule Take 1 capsule (30 mg total) by mouth 2 (two) times daily before a meal. 180 capsule 1   latanoprost (XALATAN) 0.005 % ophthalmic solution Place 1 drop into both eyes at bedtime.  11   levocetirizine (XYZAL) 5 MG tablet Take 5 mg by mouth every morning.      levothyroxine (SYNTHROID) 125 MCG tablet Take 125 mcg by mouth daily before breakfast.     methotrexate (RHEUMATREX) 2.5 MG tablet Take 6 tablets (15 mg total) by mouth once a week. 3 tablets am and at bedtime twice a day once a week,  Saturday's     metoprolol succinate (TOPROL-XL) 50 MG 24 hr tablet Take 50 mg by mouth daily. Take with or immediately following a meal.     montelukast (SINGULAIR) 10 MG tablet Take 10 mg by mouth at bedtime.     NOVOLOG 100 UNIT/ML injection Inject into the skin See admin instructions. Pt has Insulin pump     Oyster Shell Calcium 500 MG TABS 1 tablet with meals     pregabalin (LYRICA) 75 MG capsule Take 75-150 mg by mouth See admin instructions. Take 75 mg in the morning, 75 mg at lunch, and 150 mg at bedtime     rosuvastatin (CRESTOR) 20 MG tablet Take 20 mg by mouth at bedtime.     sertraline (ZOLOFT) 100 MG tablet Take 100 mg by mouth daily.     Vitamin D, Ergocalciferol, (DRISDOL) 50000 UNITS CAPS Take 50,000 Units by mouth every Saturday.     No facility-administered medications prior to visit.    PAST MEDICAL HISTORY: Past Medical History:  Diagnosis Date   Anxiety    COPD (chronic obstructive pulmonary disease) (HCC)    Depression    DOE (dyspnea on exertion)    Dyspnea    Gastroparesis    GERD (gastroesophageal reflux disease)    Glaucoma, both eyes    History of 2019 novel coronavirus disease (COVID-19) 06/20/2020   hospital admission-- dx severe covid with pneumonia, ARDS, DKA, COPD exaberation;  pt intubated 01-13th and extubated  01-20th;   (11-15-2020  per pt did not go home on oxygen, residual generalized weakness and hair loss)   History of esophageal stricture    post dilatation, followed by dr Henrene Pastor   History of Graves' disease    s/p RAI  03-03-2010  History of Helicobacter pylori infection    remote hx and treatment   History of kidney stones    Hyperlipidemia    Hypertension    followed by pcp   Hypothyroidism, postradioiodine therapy 02/2010   followed by pcp   IDA (iron deficiency anemia)    Insulin pump in place    w/ Novolog , managed by pcp   Moderate asthma    followed by pcp,  (11-15-2020  per pt has pulmonology appt on 11-17-2020))   OSA (obstructive sleep apnea)    11-15-2020  per pt has not used cpap since 05/ 2021  (previous followed by dr Brett Fairy,  study in epic 05-12-2018 moderate complex osa pt had used for awhile)   Osteoporosis    PAD (peripheral artery disease) ([place])    followed by dr berry----  s/p bilateral common iliac artery angioplasty stenting for stenosis   Peripheral neuropathy    Pneumonia    covid 2022   PONV (postoperative nausea and vomiting)    Rheumatoid arthritis ([place])    in both eyes   Right wrist fracture    Type 1 diabetes mellitus with long-term current use of insulin ([place])    per pt dx at age 51 approx,  followed by pcp,  (11-15-2020  per pt last A1c 7.2 in 04/ 2022),  checks blood sugar 6 to 10 times daily , fasting sugar-- 200 -- 300)   Vitamin D deficiency     PAST SURGICAL HISTORY: Past Surgical History:  Procedure Laterality Date   ABDOMINAL AORTOGRAM W/LOWER EXTREMITY Bilateral 01/01/2019   Procedure: ABDOMINAL AORTOGRAM W/LOWER EXTREMITY;  Surgeon: Lorretta Harp, MD;  Location: [facility];  Service: Cardiovascular;  Laterality: Bilateral;   BREAST EXCISIONAL BIOPSY Left 04-*02-2000  _0    per pt benign   BREAST SURGERY     CATARACT EXTRACTION W/ INTRAOCULAR LENS  IMPLANT, BILATERAL  11/2019   COLONOSCOPY  last one 2019 approx    CYSTOSCOPY WITH STENT PLACEMENT Right 07/22/2021   Procedure: CYSTOSCOPY WITH STENT PLACEMENT;  Surgeon: Billey Co, MD;  Location: [facility];  Service: Urology;  Laterality: Right;   CYSTOSCOPY/URETEROSCOPY/HOLMIUM LASER/STENT PLACEMENT Right 08/11/2021   Procedure: CYSTOSCOPY/URETEROSCOPY/HOLMIUM LASER/STENT EXCHANGE;  Surgeon: Billey Co, MD;  Location: [facility];  Service: Urology;  Laterality: Right;   FRACTURE SURGERY     LAPAROSCOPIC ASSISTED VAGINAL HYSTERECTOMY  06-22-2004 _1    NASAL SEPTOPLASTY W/ TURBINOPLASTY Bilateral 09/07/2013   Procedure: SEPTOPLASTY, BILATERAL TURBINATE RESECTION ;  Surgeon: Ascencion Dike, MD;  Location: [facility];  Service: ENT;  Laterality: Bilateral;   OPEN REDUCTION INTERNAL FIXATION (ORIF) DISTAL RADIAL FRACTURE Right 11/16/2020   Procedure: OPEN REDUCTION INTERNAL FIXATION (ORIF) DISTAL RADIAL FRACTURE;  Surgeon: Hiram Gash, MD;  Location: [facility];  Service: Orthopedics;  Laterality: Right;   PERIPHERAL VASCULAR INTERVENTION Bilateral 01/01/2019   Procedure: PERIPHERAL VASCULAR INTERVENTION;  Surgeon: Lorretta Harp, MD;  Location: [facility];  Service: Cardiovascular;  Laterality: Bilateral;   UPPER GASTROINTESTINAL ENDOSCOPY  last one 07-23-2019  dr Henrene Pastor    FAMILY HISTORY: Family History  Problem Relation Age of Onset   Diabetes Father    Heart attack Father    Alcoholism Father    Lung cancer Mother    Liver cancer Mother    Irritable bowel syndrome Sister    Colon cancer Neg Hx    Esophageal cancer Neg Hx    Rectal cancer Neg Hx  Stomach cancer Neg Hx     SOCIAL HISTORY: Social History   Socioeconomic History   Marital status: Widowed    Spouse name: Not on file   Number of children: 1   Years of education: Not on file   Highest education level: Not on file  Occupational History   Not on file  Tobacco Use   Smoking status: Former    Packs/day: 0.50    Years: 25.00     Total pack years: 12.50    Types: Cigarettes    Start date: 65    Quit date: 06/20/2020    Years since quitting: 1.5   Smokeless tobacco: Never   Tobacco comments:    Started smoking at age [age]  Vaping Use   Vaping Use: Never used  Substance and Sexual Activity   Alcohol use: No    Alcohol/week: 0.0 standard drinks of alcohol   Drug use: No   Sexual activity: Not Currently  Other Topics Concern   Not on file  Social History Narrative    Education 12th grade.  Caffeine 4-5 drinks daily.   Lives alone   Social Determinants of Health   Financial Resource Strain: Not on file  Food Insecurity: Not on file  Transportation Needs: Not on file  Physical Activity: Not on file  Stress: Not on file  Social Connections: Not on file  Intimate Partner Violence: Not on file      PHYSICAL EXAM  There were no vitals filed for this visit.  There is no height or weight on file to calculate BMI.  Generalized: Well developed, in no acute distress  Cardiology: normal rate and rhythm, no murmur noted Respiratory: Clear to auscultation bilaterally Neck circumference 16.25 inches Neurological examination  Mentation: Alert oriented to time, place, history taking. Follows all commands speech and language fluent Cranial nerve II-XII: Pupils were equal round reactive to light. Extraocular movements were full, visual field were full on confrontational test. Facial sensation and strength were normal. Uvula tongue midline. Head turning and shoulder shrug  were normal and symmetric. Motor: The motor testing reveals 5 over 5 strength of all 4 extremities. Good symmetric motor tone is noted throughout.  Gait and station: Gait is abnormal, patient limping due to left foot pain   DIAGNOSTIC DATA (LABS, IMAGING, TESTING) - I reviewed patient records, labs, notes, testing and imaging myself where available.      No data to display           Lab Results  Component Value Date   WBC 8.8  09/07/2021   HGB 12.4 09/07/2021   HCT 38.4 09/07/2021   MCV 86.8 09/07/2021   PLT 293.0 09/07/2021      Component Value Date/Time   NA 137 10/04/2021 1458   K 4.5 10/04/2021 1458   CL 100 10/04/2021 1458   CO2 19 (L) 10/04/2021 1458   GLUCOSE 276 (H) 10/04/2021 1458   GLUCOSE 193 (H) 08/16/2021 0845   BUN 14 10/04/2021 1458   CREATININE 0.87 10/04/2021 1458   CALCIUM 9.2 10/04/2021 1458   PROT 6.8 08/13/2021 2200   PROT 6.6 01/09/2019 1204   ALBUMIN 3.4 (L) 08/13/2021 2200   ALBUMIN 4.5 01/09/2019 1204   AST 18 08/13/2021 2200   ALT 20 08/13/2021 2200   ALKPHOS 91 08/13/2021 2200   BILITOT 0.8 08/13/2021 2200   BILITOT <0.2 01/09/2019 1204   GFRNONAA >60 08/16/2021 0845   GFRAA 93 01/30/2019 1558   Lab Results  Component Value Date  CHOL 112 01/09/2019   HDL 40 01/09/2019   LDLCALC 50 01/09/2019   TRIG 204 (H) 06/27/2020   CHOLHDL 2.8 01/09/2019   Lab Results  Component Value Date   HGBA1C 8.2 (H) 08/14/2021   Lab Results  Component Value Date   VITAMINB12 1,012 (H) 06/28/2020   Lab Results  Component Value Date   TSH 18.770 (H) 07/04/2020    ASSESSMENT AND PLAN 56 y.o. year old female  has a past medical history of Anxiety, COPD (chronic obstructive pulmonary disease) ([place]), Depression, DOE (dyspnea on exertion), Dyspnea, Gastroparesis, GERD (gastroesophageal reflux disease), Glaucoma, both eyes, History of 2019 novel coronavirus disease (COVID-19) (06/20/2020), History of esophageal stricture, History of Graves' disease, History of Helicobacter pylori infection, History of kidney stones, Hyperlipidemia, Hypertension, Hypothyroidism, postradioiodine therapy (02/2010), IDA (iron deficiency anemia), Insulin pump in place, Moderate asthma, OSA (obstructive sleep apnea), Osteoporosis, PAD (peripheral artery disease) ([place]), Peripheral neuropathy, Pneumonia, PONV (postoperative nausea and vomiting), Rheumatoid arthritis ([place]), Right wrist fracture, Type 1 diabetes  mellitus with long-term current use of insulin ([place]), and Vitamin D deficiency. here with   No diagnosis found.   Conleigh is doing much better since receiving new mask and headgear.  Week has been corrected on updated compliance report.  She is using her CPAP every night.  Greater than 4 hours usage is at 77%.  I have encouraged her to continue using CPAP nightly and for greater than 4 hours each night.  Risk of untreated sleep apnea discussed.  Educational materials provided in AVS.  I have advised one-year follow-up, sooner if needed.  She verbalizes understanding and agreement with this plan.   No orders of the defined types were placed in this encounter.    No orders of the defined types were placed in this encounter.     Debbora Presto, FNP-C 12/27/2021, 4:00 PM [facility] [address], [place], [place] [address] [phone]

## 2021-12-28 ENCOUNTER — Encounter: Payer: Self-pay | Admitting: Family Medicine

## 2021-12-28 ENCOUNTER — Encounter: Payer: Medicare HMO | Admitting: Family Medicine

## 2021-12-28 DIAGNOSIS — G4733 Obstructive sleep apnea (adult) (pediatric): Secondary | ICD-10-CM

## 2022-01-01 DIAGNOSIS — F418 Other specified anxiety disorders: Secondary | ICD-10-CM | POA: Diagnosis not present

## 2022-01-01 DIAGNOSIS — F4321 Adjustment disorder with depressed mood: Secondary | ICD-10-CM | POA: Diagnosis not present

## 2022-01-01 DIAGNOSIS — F4320 Adjustment disorder, unspecified: Secondary | ICD-10-CM | POA: Diagnosis not present

## 2022-01-02 ENCOUNTER — Other Ambulatory Visit (HOSPITAL_BASED_OUTPATIENT_CLINIC_OR_DEPARTMENT_OTHER): Payer: Medicare HMO

## 2022-01-04 ENCOUNTER — Encounter: Payer: Self-pay | Admitting: Family Medicine

## 2022-01-04 ENCOUNTER — Ambulatory Visit: Payer: Medicare HMO | Admitting: Family Medicine

## 2022-01-04 VITALS — BP 178/71 | HR 100 | Ht 59.0 in | Wt 165.5 lb

## 2022-01-04 DIAGNOSIS — Z9989 Dependence on other enabling machines and devices: Secondary | ICD-10-CM | POA: Diagnosis not present

## 2022-01-04 DIAGNOSIS — G4733 Obstructive sleep apnea (adult) (pediatric): Secondary | ICD-10-CM

## 2022-01-04 NOTE — Patient Instructions (Addendum)
Please continue using your CPAP regularly. While your insurance requires that you use CPAP at least 4 hours each night on 70% of the nights, I recommend, that you not skip any nights and use it throughout the night if you can. Getting used to CPAP and staying with the treatment long term does take time and patience and discipline. Untreated obstructive sleep apnea when it is moderate to severe can have an adverse impact on cardiovascular health and raise her risk for heart disease, arrhythmias, hypertension, congestive heart failure, stroke and diabetes. Untreated obstructive sleep apnea causes sleep disruption, nonrestorative sleep, and sleep deprivation. This can have an impact on your day to day functioning and cause daytime sleepiness and impairment of cognitive function, memory loss, mood disturbance, and problems focussing. Using CPAP regularly can improve these symptoms.  DME: [facility] Phone: [phone]  Follow up with me 3-4 months

## 2022-01-04 NOTE — Progress Notes (Signed)
PATIENT: Ashley [name] DOB: 1965/11/09  REASON FOR VISIT: follow up HISTORY FROM: patient  Chief Complaint  Patient presents with   Obstructive Sleep Apnea    Rm 1, alone. Here to f/u for OSA, on CPAP. Pt has not used her CPAP in over a year. Would like to discuss inspire.      HISTORY OF PRESENT ILLNESS:  01/04/22 ALL: Ashley [name] returns for follow up for OSA on CPAP. She was last seen 12/2018. She reports that she has not used CPAP in over a year. She had Covid in 06/2020 requiring ventilator support while hospitalized. She is continuing to recover. She lost her husband in 06/2021. She reports being extremely tired and has difficulty sleeping. She admits to poor sleep hygiene. She does not love using CPAP therapy due to having to wear the mask. She feels FFM is the best option for her. She was previously doing well with usage and apnea was well managed.   12/11/2018 ALL:  Ashley [name] is a 56 y.o. female here today for follow up of OSA on CPAP.  She reports that she is doing better.  She did get a new mask and headgear.  She feels that this is helped with her leak.  She still feels tired.  Compliance data dated 11/10/2018 through 12/09/2018 reveals that she is using her CPAP machine 30 out of the last 30 days for compliance of 100%.  23 of those days were used greater than 4 hours for compliance of 77%.  AHI was 2.6 on 6 to 15 cm of water and EPR of 3.  There was no significant leak.  She returns today for evaluation  HISTORY: (copied from my note on 10/08/2018)  Ashley [name] is a 56 y.o. female for follow up of OSA on CPAP.  Loma reports that she is doing well on CPAP therapy.  She is continuing to get adjusted to using her machine every night.  She states that she uses it every night but does sometimes pull the machine off in the middle the night.  There are days that she does not get the complete 4-hour therapy recommended.  She has noticed that her mask leaks air around the nasal  bridge.  She is using a fullface mask.  She feels that the head piece is a little flimsy and that maybe she could tighten this up.  Otherwise she is doing well without complaints.  REVIEW OF SYSTEMS: Out of a complete 14 system review of symptoms, the patient complains only of the following symptoms, headache, hoarse voice, fatigue, depression and all other reviewed systems are negative.  Epworth sleepiness scale: 18/24  ALLERGIES: Allergies  Allergen Reactions   Mirtazapine Hives and Rash   Clindamycin Hives   Macrobid [Nitrofurantoin] Hives   Other Hives    Tylox    Sulfonamide Derivatives Hives   Alendronate Sodium Itching   Fosamax [Alendronate]     Other reaction(s): HIVES   Lipitor [Atorvastatin] Other (See Comments)    MUSCLE ACHES   Restasis [Cyclosporine]     Pt stated, "My eyes turned red on the inside and outside of eye; burning sensation"   Sulfa Antibiotics Hives   Topamax [Topiramate] Itching    HOME MEDICATIONS: Outpatient Medications Prior to Visit  Medication Sig Dispense Refill   ACCU-CHEK COMPACT PLUS test strip 1 each by Other route as needed.      ACCU-CHEK SOFTCLIX LANCETS lancets      acetaminophen (TYLENOL) 500 MG  tablet Take 1,000 mg by mouth every 6 (six) hours as needed for moderate pain.     albuterol (PROVENTIL) (2.5 MG/3ML) 0.083% nebulizer solution Take 3 mLs (2.5 mg total) by nebulization every 4 (four) hours as needed for wheezing or shortness of breath. 75 mL 3   Albuterol Sulfate, sensor, (PROAIR DIGIHALER) 108 (90 Base) MCG/ACT AEPB Inhale 2 puffs into the lungs every 6 (six) hours as needed (shortness of breath).     ALPRAZolam (XANAX) 0.5 MG tablet Take 0.5-1 mg by mouth 3 (three) times daily as needed for anxiety.     AMBULATORY NON FORMULARY MEDICATION NOVOLOG INSULIN PUMP USES AS DIRECTED     aspirin EC 81 MG tablet Take 81 mg by mouth daily. Swallow whole.     Biotin w/ Vitamins C & E (HAIR/SKIN/NAILS PO) Take 1 capsule by mouth at  bedtime.     Blood Glucose Monitoring Suppl (ACCU-CHEK AVIVA PLUS) w/Device KIT      clopidogrel (PLAVIX) 75 MG tablet TAKE 1 TABLET EVERY DAY WITH BREAKFAST 90 tablet 3   estradiol (ESTRACE) 1 MG tablet Take 1 mg by mouth at bedtime.     folic acid (FOLVITE) 1 MG tablet Take 1 tablet by mouth daily.     Insulin Human (INSULIN PUMP) SOLN Inject into the skin. Novolog----  basal rate--- 12 am - 2 am = 1.3, 2 am - 6 am = 1.4, 6 am - 3 pm = 1.8, 3 pm - 12 am = 1.55     lansoprazole (PREVACID) 30 MG capsule Take 1 capsule (30 mg total) by mouth 2 (two) times daily before a meal. 180 capsule 1   latanoprost (XALATAN) 0.005 % ophthalmic solution Place 1 drop into both eyes at bedtime.  11   levocetirizine (XYZAL) 5 MG tablet Take 5 mg by mouth every morning.      levothyroxine (SYNTHROID) 125 MCG tablet Take 125 mcg by mouth daily before breakfast.     methotrexate (RHEUMATREX) 2.5 MG tablet Take 6 tablets (15 mg total) by mouth once a week. 3 tablets am and at bedtime twice a day once a week,  Saturday's     metoprolol succinate (TOPROL-XL) 50 MG 24 hr tablet Take 50 mg by mouth daily. Take with or immediately following a meal.     montelukast (SINGULAIR) 10 MG tablet Take 10 mg by mouth at bedtime.     NOVOLOG 100 UNIT/ML injection Inject into the skin See admin instructions. Pt has Insulin pump     Oyster Shell Calcium 500 MG TABS 1 tablet with meals     pregabalin (LYRICA) 75 MG capsule Take 75-150 mg by mouth See admin instructions. Take 75 mg in the morning, 75 mg at lunch, and 150 mg at bedtime     rosuvastatin (CRESTOR) 20 MG tablet Take 20 mg by mouth at bedtime.     Sertraline HCl 200 MG CAPS Take 200 mg by mouth daily.     Vitamin D, Ergocalciferol, (DRISDOL) 50000 UNITS CAPS Take 50,000 Units by mouth every Saturday.     budesonide (PULMICORT) 0.25 MG/2ML nebulizer solution Take 2 mLs (0.25 mg total) by nebulization 2 (two) times daily as needed (wheezing/dyspnea).     famotidine (PEPCID)  40 MG tablet Take 1 tablet (40 mg total) by mouth at bedtime. Office visit for further refills 90 tablet 0   No facility-administered medications prior to visit.    PAST MEDICAL HISTORY: Past Medical History:  Diagnosis Date   Anxiety  COPD (chronic obstructive pulmonary disease) (HCC)    Depression    DOE (dyspnea on exertion)    Dyspnea    Gastroparesis    GERD (gastroesophageal reflux disease)    Glaucoma, both eyes    History of 2019 novel coronavirus disease (COVID-19) 06/20/2020   hospital admission-- dx severe covid with pneumonia, ARDS, DKA, COPD exaberation;  pt intubated 01-13th and extubated 01-20th;   (11-15-2020  per pt did not go home on oxygen, residual generalized weakness and hair loss)   History of esophageal stricture    post dilatation, followed by dr Henrene Pastor   History of Graves' disease    s/p RAI  [id number]   History of Helicobacter pylori infection    remote hx and treatment   History of kidney stones    Hyperlipidemia    Hypertension    followed by pcp   Hypothyroidism, postradioiodine therapy 02/2010   followed by pcp   IDA (iron deficiency anemia)    Insulin pump in place    w/ Novolog , managed by pcp   Moderate asthma    followed by pcp,  (11-15-2020  per pt has pulmonology appt on 11-17-2020))   OSA (obstructive sleep apnea)    11-15-2020  per pt has not used cpap since 05/ 2021  (previous followed by dr Brett Fairy,  study in epic 05-12-2018 moderate complex osa pt had used for awhile)   Osteoporosis    PAD (peripheral artery disease) ([place])    followed by dr berry----  s/p bilateral common iliac artery angioplasty stenting for stenosis   Peripheral neuropathy    Pneumonia    covid 2022   PONV (postoperative nausea and vomiting)    Rheumatoid arthritis ([place])    in both eyes   Right wrist fracture    Type 1 diabetes mellitus with long-term current use of insulin ([place])    per pt dx at age 43 approx,  followed by pcp,  (11-15-2020  per pt last  A1c 7.2 in 04/ 2022),  checks blood sugar 6 to 10 times daily , fasting sugar-- 200 -- 300)   Vitamin D deficiency     PAST SURGICAL HISTORY: Past Surgical History:  Procedure Laterality Date   ABDOMINAL AORTOGRAM W/LOWER EXTREMITY Bilateral 01/01/2019   Procedure: ABDOMINAL AORTOGRAM W/LOWER EXTREMITY;  Surgeon: Lorretta Harp, MD;  Location: [facility];  Service: Cardiovascular;  Laterality: Bilateral;   BREAST EXCISIONAL BIOPSY Left 04-*02-2000  _0    per pt benign   BREAST SURGERY     CATARACT EXTRACTION W/ INTRAOCULAR LENS  IMPLANT, BILATERAL  11/2019   COLONOSCOPY  last one 2019 approx   CYSTOSCOPY WITH STENT PLACEMENT Right 07/22/2021   Procedure: CYSTOSCOPY WITH STENT PLACEMENT;  Surgeon: Billey Co, MD;  Location: [facility];  Service: Urology;  Laterality: Right;   CYSTOSCOPY/URETEROSCOPY/HOLMIUM LASER/STENT PLACEMENT Right 08/11/2021   Procedure: CYSTOSCOPY/URETEROSCOPY/HOLMIUM LASER/STENT EXCHANGE;  Surgeon: Billey Co, MD;  Location: [facility];  Service: Urology;  Laterality: Right;   FRACTURE SURGERY     LAPAROSCOPIC ASSISTED VAGINAL HYSTERECTOMY  06-22-2004 _1    NASAL SEPTOPLASTY W/ TURBINOPLASTY Bilateral 09/07/2013   Procedure: SEPTOPLASTY, BILATERAL TURBINATE RESECTION ;  Surgeon: Ascencion Dike, MD;  Location: [facility];  Service: ENT;  Laterality: Bilateral;   OPEN REDUCTION INTERNAL FIXATION (ORIF) DISTAL RADIAL FRACTURE Right 11/16/2020   Procedure: OPEN REDUCTION INTERNAL FIXATION (ORIF) DISTAL RADIAL FRACTURE;  Surgeon: Hiram Gash, MD;  Location: [facility];  Service: Orthopedics;  Laterality: Right;   PERIPHERAL VASCULAR INTERVENTION Bilateral 01/01/2019   Procedure: PERIPHERAL VASCULAR INTERVENTION;  Surgeon: Lorretta Harp, MD;  Location: [facility];  Service: Cardiovascular;  Laterality: Bilateral;   UPPER GASTROINTESTINAL ENDOSCOPY  last one 07-23-2019  dr Henrene Pastor    FAMILY HISTORY: Family History   Problem Relation Age of Onset   Diabetes Father    Heart attack Father    Alcoholism Father    Lung cancer Mother    Liver cancer Mother    Irritable bowel syndrome Sister    Colon cancer Neg Hx    Esophageal cancer Neg Hx    Rectal cancer Neg Hx    Stomach cancer Neg Hx     SOCIAL HISTORY: Social History   Socioeconomic History   Marital status: Widowed    Spouse name: Not on file   Number of children: 1   Years of education: Not on file   Highest education level: Not on file  Occupational History   Not on file  Tobacco Use   Smoking status: Former    Packs/day: 0.50    Years: 25.00    Total pack years: 12.50    Types: Cigarettes    Start date: 44    Quit date: 06/20/2020    Years since quitting: 1.5   Smokeless tobacco: Never   Tobacco comments:    Started smoking at age [age]  Vaping Use   Vaping Use: Never used  Substance and Sexual Activity   Alcohol use: No    Alcohol/week: 0.0 standard drinks of alcohol   Drug use: No   Sexual activity: Not Currently  Other Topics Concern   Not on file  Social History Narrative    Education 12th grade.  Caffeine 4-5 drinks daily.   Lives alone   Social Determinants of Health   Financial Resource Strain: Not on file  Food Insecurity: Not on file  Transportation Needs: Not on file  Physical Activity: Not on file  Stress: Not on file  Social Connections: Not on file  Intimate Partner Violence: Not on file      PHYSICAL EXAM  Vitals:   01/04/22 1247  BP: (!) 178/71  Pulse: 100  Weight: 165 lb 8 oz (75.1 kg)  Height: 4' 11" (1.499 m)    Body mass index is 33.43 kg/m.  Generalized: Well developed, in no acute distress  Cardiology: normal rate and rhythm, no murmur noted Respiratory: Clear to auscultation bilaterally Neck circumference 16.25 inches Neurological examination  Mentation: Alert oriented to time, place, history taking. Follows all commands speech and language fluent Cranial nerve II-XII:  Pupils were equal round reactive to light. Extraocular movements were full, visual field were full on confrontational test. Facial sensation and strength were normal. Uvula tongue midline. Head turning and shoulder shrug  were normal and symmetric. Motor: The motor testing reveals 5 over 5 strength of all 4 extremities. Good symmetric motor tone is noted throughout.  Gait and station: Gait is abnormal, patient limping due to left foot pain   DIAGNOSTIC DATA (LABS, IMAGING, TESTING) - I reviewed patient records, labs, notes, testing and imaging myself where available.      No data to display           Lab Results  Component Value Date   WBC 8.8 09/07/2021   HGB 12.4 09/07/2021   HCT 38.4 09/07/2021   MCV 86.8 09/07/2021   PLT 293.0 09/07/2021      Component Value Date/Time  NA 137 10/04/2021 1458   K 4.5 10/04/2021 1458   CL 100 10/04/2021 1458   CO2 19 (L) 10/04/2021 1458   GLUCOSE 276 (H) 10/04/2021 1458   GLUCOSE 193 (H) 08/16/2021 0845   BUN 14 10/04/2021 1458   CREATININE 0.87 10/04/2021 1458   CALCIUM 9.2 10/04/2021 1458   PROT 6.8 08/13/2021 2200   PROT 6.6 01/09/2019 1204   ALBUMIN 3.4 (L) 08/13/2021 2200   ALBUMIN 4.5 01/09/2019 1204   AST 18 08/13/2021 2200   ALT 20 08/13/2021 2200   ALKPHOS 91 08/13/2021 2200   BILITOT 0.8 08/13/2021 2200   BILITOT <0.2 01/09/2019 1204   GFRNONAA >60 08/16/2021 0845   GFRAA 93 01/30/2019 1558   Lab Results  Component Value Date   CHOL 112 01/09/2019   HDL 40 01/09/2019   LDLCALC 50 01/09/2019   TRIG 204 (H) 06/27/2020   CHOLHDL 2.8 01/09/2019   Lab Results  Component Value Date   HGBA1C 8.2 (H) 08/14/2021   Lab Results  Component Value Date   VITAMINB12 1,012 (H) 06/28/2020   Lab Results  Component Value Date   TSH 18.770 (H) 07/04/2020    ASSESSMENT AND PLAN 56 y.o. year old female  has a past medical history of Anxiety, COPD (chronic obstructive pulmonary disease) ([place]), Depression, DOE (dyspnea on  exertion), Dyspnea, Gastroparesis, GERD (gastroesophageal reflux disease), Glaucoma, both eyes, History of 2019 novel coronavirus disease (COVID-19) (06/20/2020), History of esophageal stricture, History of Graves' disease, History of Helicobacter pylori infection, History of kidney stones, Hyperlipidemia, Hypertension, Hypothyroidism, postradioiodine therapy (02/2010), IDA (iron deficiency anemia), Insulin pump in place, Moderate asthma, OSA (obstructive sleep apnea), Osteoporosis, PAD (peripheral artery disease) ([place]), Peripheral neuropathy, Pneumonia, PONV (postoperative nausea and vomiting), Rheumatoid arthritis ([place]), Right wrist fracture, Type 1 diabetes mellitus with long-term current use of insulin ([place]), and Vitamin D deficiency. here with     ICD-10-CM   1. OSA on CPAP  G47.33    Z99.89        Beatric has not used CPAP recently. She was hospitalized with Covid last year requiring mechanical ventilation and extensive rehab afterwards. She lost her husband, unexpectedly, this year. We have reviewed her sleep study results in 2019 of moderate OSA with severe hypoxia. Dawna Part would not be a treatment option for her. I have encouraged her to continue using CPAP nightly and for greater than 4 hours each night.  Risk of untreated sleep apnea discussed. I offered to send her for a mask refitting but she wishes to hold off at this time. Educational materials provided in AVS.  I have advised 4 month follow-up, sooner if needed.  She verbalizes understanding and agreement with this plan.   No orders of the defined types were placed in this encounter.    No orders of the defined types were placed in this encounter.     Debbora Presto, FNP-C 01/04/2022, 1:28 PM [facility] [address] [place], [place] [address] [phone]

## 2022-01-07 ENCOUNTER — Ambulatory Visit (HOSPITAL_BASED_OUTPATIENT_CLINIC_OR_DEPARTMENT_OTHER)
Admission: RE | Admit: 2022-01-07 | Discharge: 2022-01-07 | Disposition: A | Discharge status: Home / Self Care | Payer: Medicare HMO | Source: Ambulatory Visit | Attending: Cardiovascular Disease | Admitting: Cardiovascular Disease

## 2022-01-07 DIAGNOSIS — R0789 Other chest pain: Secondary | ICD-10-CM | POA: Insufficient documentation

## 2022-01-07 DIAGNOSIS — I10 Essential (primary) hypertension: Secondary | ICD-10-CM | POA: Insufficient documentation

## 2022-01-07 DIAGNOSIS — E785 Hyperlipidemia, unspecified: Secondary | ICD-10-CM | POA: Insufficient documentation

## 2022-01-07 IMAGING — CT CT HEAD W/O CM
4 series · 17 of 47 positions shown, 19 images · non-contrast
Comparison: CT paranasal sinuses 02/28/2018 and 09/12/2007

CLINICAL DATA: Altered mental status. Sore throat and ear pain on
antibiotics. Unresponsive.

EXAM:
CT HEAD WITHOUT CONTRAST
TECHNIQUE: Contiguous axial images were obtained from the base of the skull
through the vertex without intravenous contrast.

[Series 3: head without · axial · non-contrast · 0.39mm/px · z∈[-39,+71]mm · 7 of 30 slices shown, 9 images]
[im 4/30  brain]
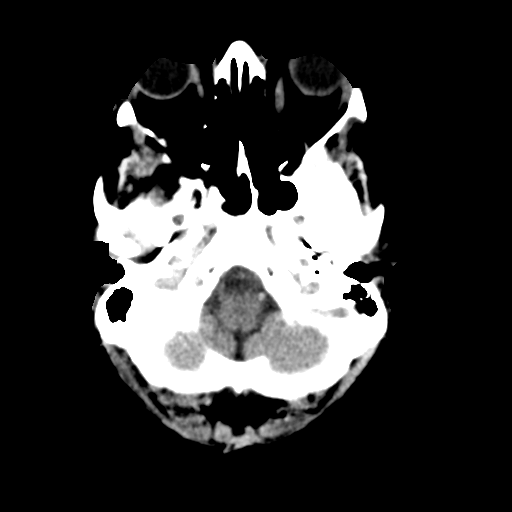
[im 4/30  bone]
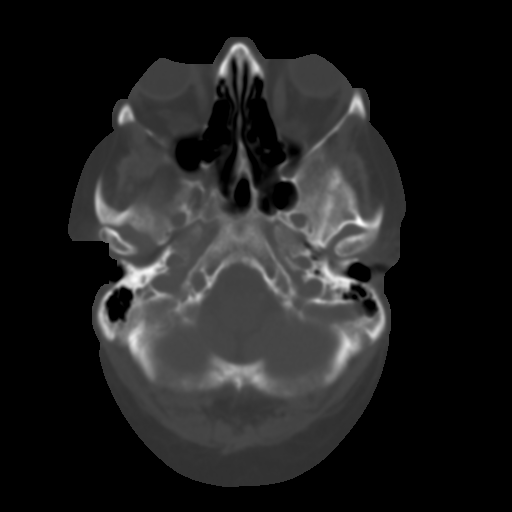
[im 8/30  brain]
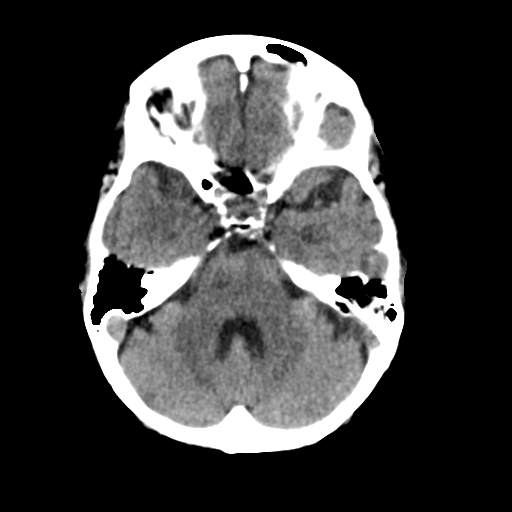
[im 11/30  brain]
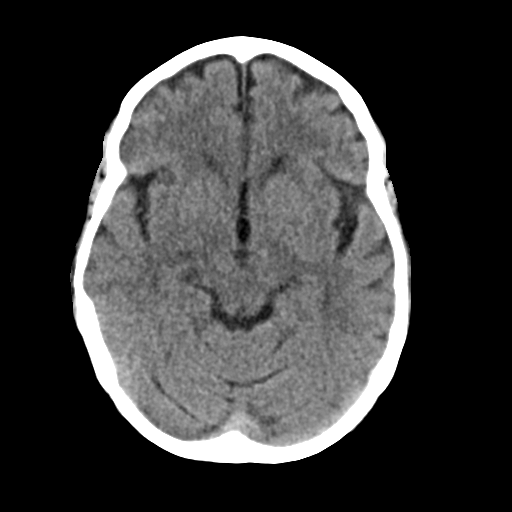
[im 15/30  brain]
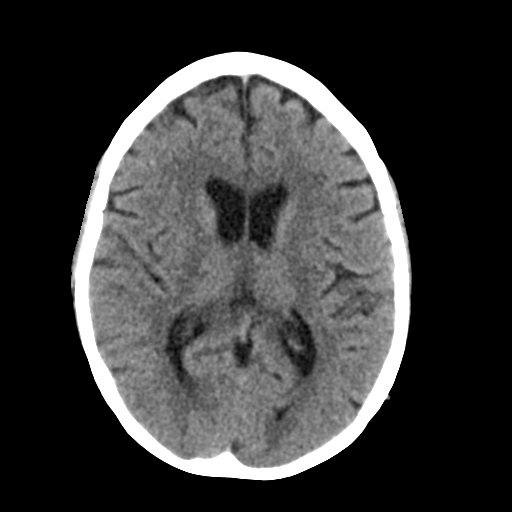
[im 19/30  brain]
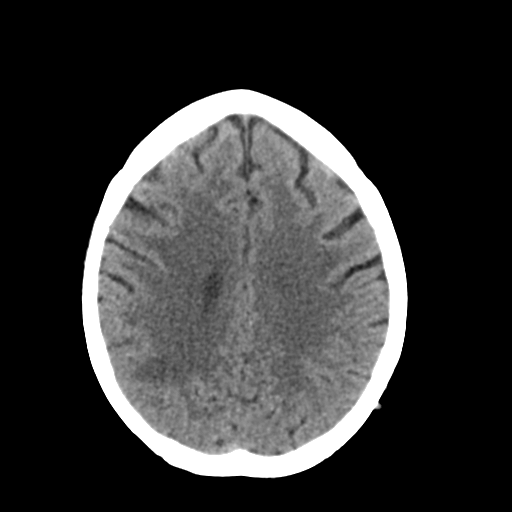
[im 19/30  bone]
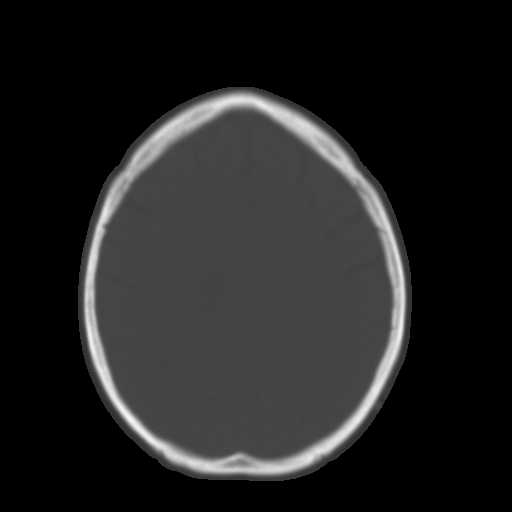
[im 22/30  brain]
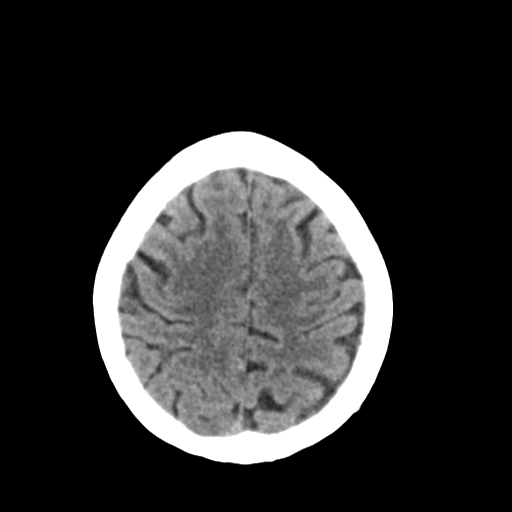
[im 26/30  brain]
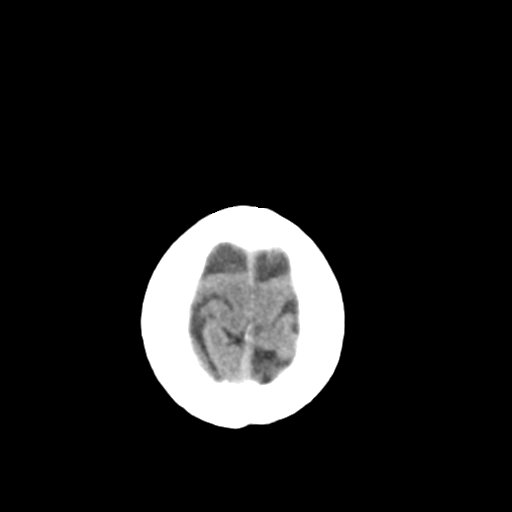

[Series 4: head bone · axial · 0.39mm/px · z∈[-40,+10]mm · 4 of 73 slices shown]
[im 8/73  bone]
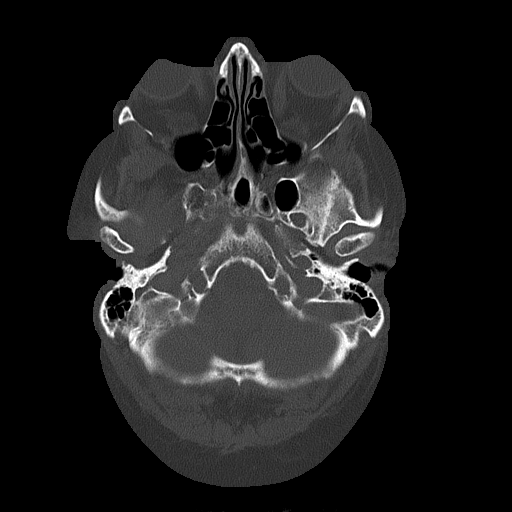
[im 15/73  bone]
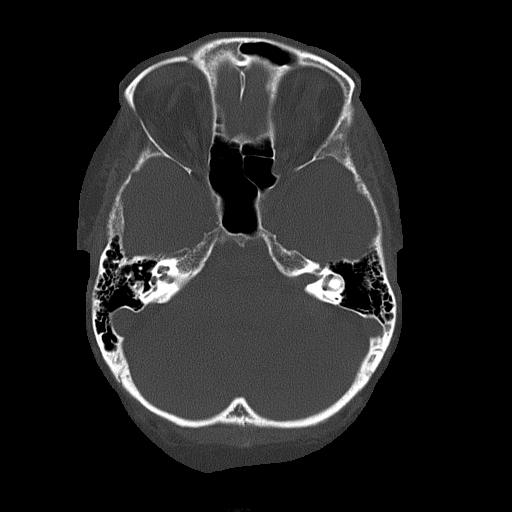
[im 22/73  bone]
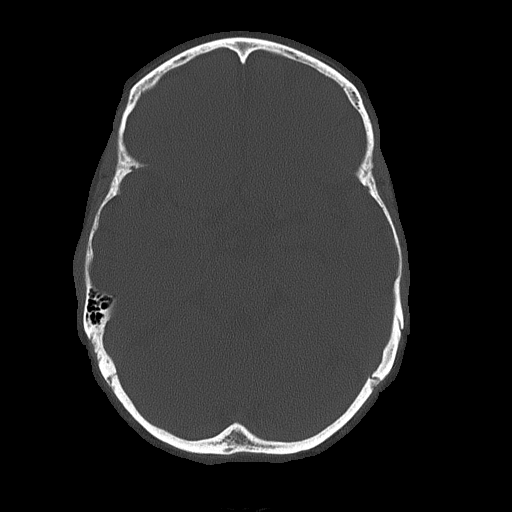
[im 33/73  bone]
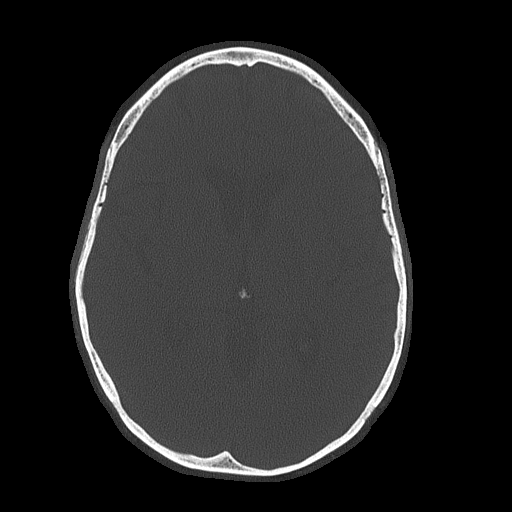

[Series 5: head without cor · coronal · non-contrast · 0.29mm/px · 3 of 62 slices shown]
[im 21/62  brain]
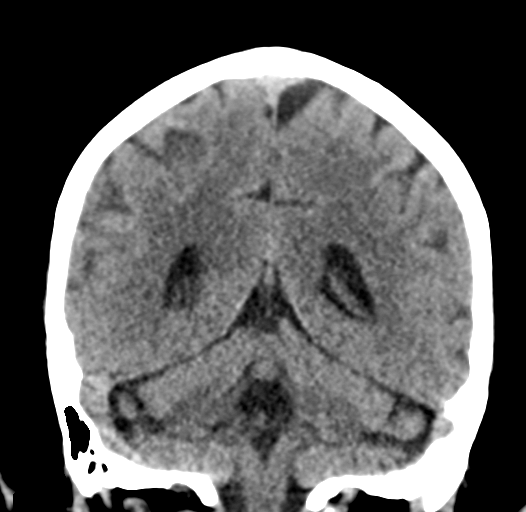
[im 28/62  brain]
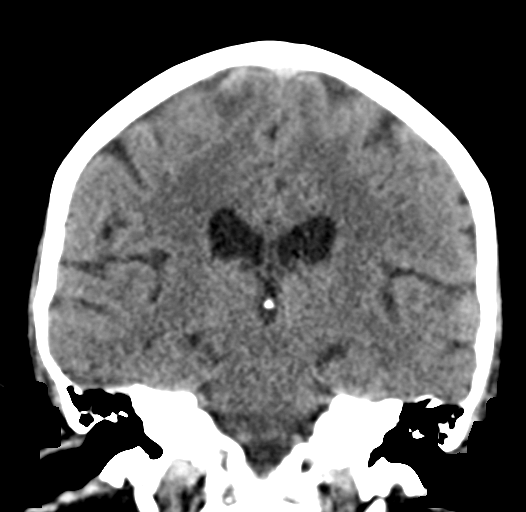
[im 34/62  brain]
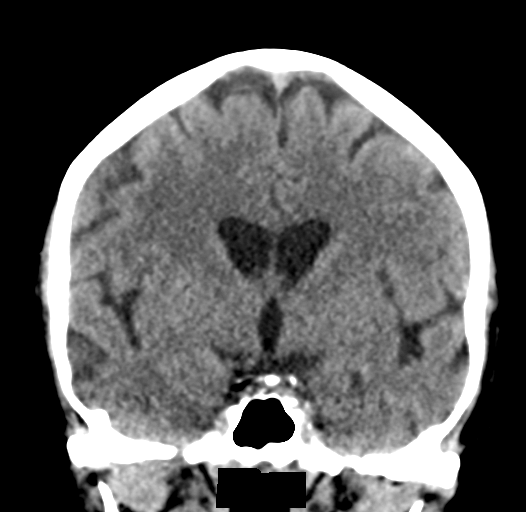

[Series 6: head without sag · sagittal · non-contrast · 0.29mm/px · 3 of 50 slices shown]
[im 17/50  brain]
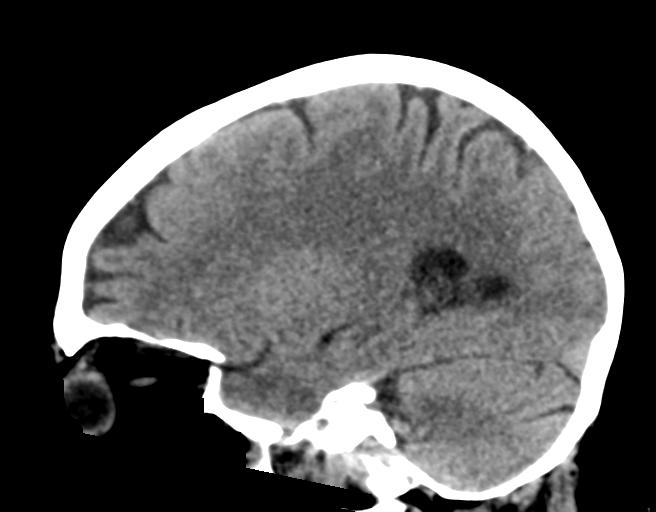
[im 25/50  brain]
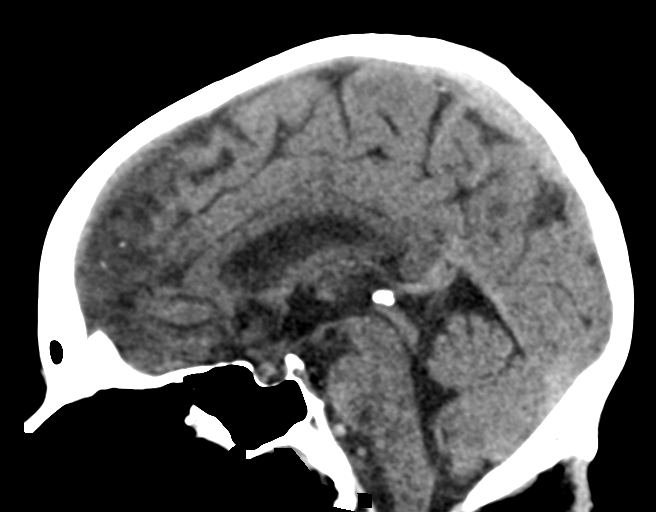
[im 33/50  brain]
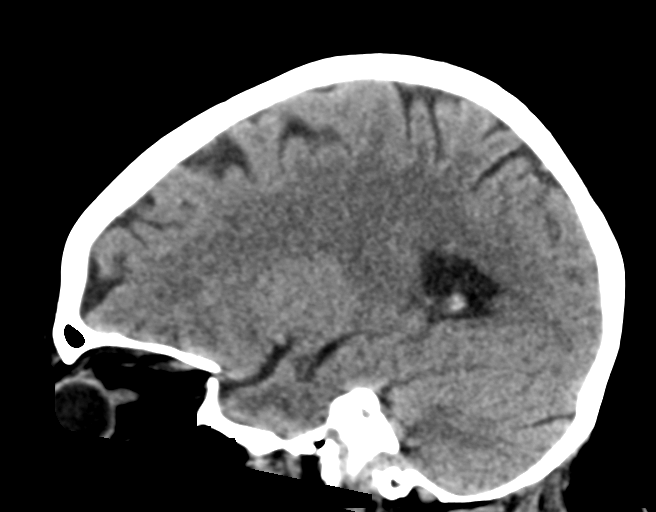

[17 of 47 positions shown; findings below may reference images not displayed]

FINDINGS: Brain: No evidence of acute infarction, hemorrhage, hydrocephalus,
extra-axial collection or mass lesion/mass effect. Mild cerebral
atrophy and small vessel ischemic changes.

Vascular: Mild intracranial arterial vascular calcifications.

Skull: Calvarium appears intact.

Sinuses/Orbits: Paranasal sinuses and mastoid air cells are clear.

Other: None.
IMPRESSION: No acute intracranial abnormalities. Mild cerebral atrophy and small
vessel ischemic changes.

## 2022-01-15 ENCOUNTER — Ambulatory Visit: Payer: Medicare HMO | Admitting: Pulmonary Disease

## 2022-01-16 DIAGNOSIS — J439 Emphysema, unspecified: Secondary | ICD-10-CM | POA: Diagnosis not present

## 2022-01-16 DIAGNOSIS — Z1152 Encounter for screening for COVID-19: Secondary | ICD-10-CM | POA: Diagnosis not present

## 2022-01-16 DIAGNOSIS — E10649 Type 1 diabetes mellitus with hypoglycemia without coma: Secondary | ICD-10-CM | POA: Diagnosis not present

## 2022-01-16 DIAGNOSIS — J302 Other seasonal allergic rhinitis: Secondary | ICD-10-CM | POA: Diagnosis not present

## 2022-01-16 DIAGNOSIS — H6591 Unspecified nonsuppurative otitis media, right ear: Secondary | ICD-10-CM | POA: Diagnosis not present

## 2022-01-16 DIAGNOSIS — J069 Acute upper respiratory infection, unspecified: Secondary | ICD-10-CM | POA: Diagnosis not present

## 2022-01-16 DIAGNOSIS — J029 Acute pharyngitis, unspecified: Secondary | ICD-10-CM | POA: Diagnosis not present

## 2022-01-17 ENCOUNTER — Ambulatory Visit: Payer: Medicare HMO | Admitting: Pulmonary Disease

## 2022-01-17 ENCOUNTER — Encounter: Payer: Self-pay | Admitting: Pulmonary Disease

## 2022-01-17 VITALS — BP 130/80 | HR 80 | Ht 59.0 in | Wt 163.2 lb

## 2022-01-17 DIAGNOSIS — E669 Obesity, unspecified: Secondary | ICD-10-CM | POA: Diagnosis not present

## 2022-01-17 DIAGNOSIS — J4540 Moderate persistent asthma, uncomplicated: Secondary | ICD-10-CM | POA: Diagnosis not present

## 2022-01-17 DIAGNOSIS — Z8616 Personal history of COVID-19: Secondary | ICD-10-CM

## 2022-01-17 DIAGNOSIS — G4733 Obstructive sleep apnea (adult) (pediatric): Secondary | ICD-10-CM | POA: Diagnosis not present

## 2022-01-17 DIAGNOSIS — Z9989 Dependence on other enabling machines and devices: Secondary | ICD-10-CM

## 2022-01-17 DIAGNOSIS — Z87891 Personal history of nicotine dependence: Secondary | ICD-10-CM

## 2022-01-17 MED ORDER — TRELEGY ELLIPTA 200-62.5-25 MCG/ACT IN AEPB: 1.0000 | INHALATION_SPRAY | Freq: Every day | RESPIRATORY_TRACT | 1 refills | Status: AC

## 2022-01-17 MED ORDER — BUDESONIDE 0.25 MG/2ML IN SUSP: 0.2500 mg | Freq: Two times a day (BID) | RESPIRATORY_TRACT | 1 refills | Status: DC | PRN

## 2022-01-17 MED ORDER — TRELEGY ELLIPTA 100-62.5-25 MCG/ACT IN AEPB: 1.0000 | INHALATION_SPRAY | Freq: Every day | RESPIRATORY_TRACT | 0 refills | Status: DC

## 2022-01-17 NOTE — Progress Notes (Signed)
Synopsis: Referred in July 2022 for history of covid, by Prince Solian, MD  Subjective:   PATIENT ID: Ashley [name] GENDER: female DOB: July 31, 1965, MRN: [id number]  Chief Complaint  Patient presents with   Follow-up    Follow-up: on SOB    56 year old female, past medical history of COPD, former smoker, 40 years, half pack per day, 20-pack-year history, quit January 2021.  Gastroparesis, GERD, hypertension, hyperlipidemia.  Patient developed COVID-19 bilateral pneumonia in January 2022.  She was admitted to [facility] was intubated and placed on mechanical life support for 8 days.  She has had a subsequent long recovery but is now off oxygen.  She still feels short of breath and has cough at times.  OV 06/07/2021: Here today for follow-up regarding COPD/asthma.  She is also post COVID with ongoing symptoms despite inhaler regimen.  She is currently on Breo plus Pulmicort plus as needed albuterol.  Last set of labs were completed during a hospitalization with no significant eosinophilic elevation, eosinophils were 100 but she was on prednisone during this hospitalization back in January 2022.  She still has triggers from smoke or any kind of particulate matter.  Strong smells in the cold weather are still stimulating her to have difficulty breathing.  It seems that she has more of a reactive airway at this time.  Of note she recently had a urinary tract infection was given Macrobid.  She is allergic to sulfa medications.  She broke out in a diffuse morbilliform rash.  OV 09/07/2021: Here today for follow-up regarding asthma.  Initially was seen post COVID.  Currently at that time was on Breo and Pulmicort.  We switched her to [place].  She used her inhalers for a short period and has fallen off using any of those.  At this point maintaining on albuterol.  She went through a whole lot in January of this past year after her husband died from a heart attack at home.  She  still unfortunately is having significant shortness of breath.  OV 01/17/2022: She is here today for follow-up regarding asthma.  For some reason she still is not using a maintenance inhaler regularly.  She also never had the Pulmicort filled has not been using this.  She was given Breztri at her last office visit with me and not been using that.  She had RAST panel testing that was unremarkable.  IgE also unremarkable.  From respiratory standpoint she is still using her albuterol daily.  Usually gets 1 albuterol inhaler per month from the pharmacy.     Past Medical History:  Diagnosis Date   Anxiety    COPD (chronic obstructive pulmonary disease) (HCC)    Depression    DOE (dyspnea on exertion)    Dyspnea    Gastroparesis    GERD (gastroesophageal reflux disease)    Glaucoma, both eyes    History of 2019 novel coronavirus disease (COVID-19) 06/20/2020   hospital admission-- dx severe covid with pneumonia, ARDS, DKA, COPD exaberation;  pt intubated 01-13th and extubated 01-20th;   (11-15-2020  per pt did not go home on oxygen, residual generalized weakness and hair loss)   History of esophageal stricture    post dilatation, followed by dr Henrene Pastor   History of Graves' disease    s/p RAI  [phone]   History of Helicobacter pylori infection    remote hx and treatment   History of kidney stones    Hyperlipidemia    Hypertension  followed by pcp   Hypothyroidism, postradioiodine therapy 02/2010   followed by pcp   IDA (iron deficiency anemia)    Insulin pump in place    w/ Novolog , managed by pcp   Moderate asthma    followed by pcp,  (11-15-2020  per pt has pulmonology appt on 11-17-2020))   OSA (obstructive sleep apnea)    11-15-2020  per pt has not used cpap since 05/ 2021  (previous followed by dr Brett Fairy,  study in epic 05-12-2018 moderate complex osa pt had used for awhile)   Osteoporosis    PAD (peripheral artery disease) ([place])    followed by dr berry----  s/p bilateral  common iliac artery angioplasty stenting for stenosis   Peripheral neuropathy    Pneumonia    covid 2022   PONV (postoperative nausea and vomiting)    Rheumatoid arthritis ([place])    in both eyes   Right wrist fracture    Type 1 diabetes mellitus with long-term current use of insulin ([place])    per pt dx at age 29 approx,  followed by pcp,  (11-15-2020  per pt last A1c 7.2 in 04/ 2022),  checks blood sugar 6 to 10 times daily , fasting sugar-- 200 -- 300)   Vitamin D deficiency      Family History  Problem Relation Age of Onset   Diabetes Father    Heart attack Father    Alcoholism Father    Lung cancer Mother    Liver cancer Mother    Irritable bowel syndrome Sister    Colon cancer Neg Hx    Esophageal cancer Neg Hx    Rectal cancer Neg Hx    Stomach cancer Neg Hx      Past Surgical History:  Procedure Laterality Date   ABDOMINAL AORTOGRAM W/LOWER EXTREMITY Bilateral 01/01/2019   Procedure: ABDOMINAL AORTOGRAM W/LOWER EXTREMITY;  Surgeon: Lorretta Harp, MD;  Location: [facility];  Service: Cardiovascular;  Laterality: Bilateral;   BREAST EXCISIONAL BIOPSY Left 04-*02-2000  '@[facility]'    per pt benign   BREAST SURGERY     CATARACT EXTRACTION W/ INTRAOCULAR LENS  IMPLANT, BILATERAL  11/2019   COLONOSCOPY  last one 2019 approx   CYSTOSCOPY WITH STENT PLACEMENT Right 07/22/2021   Procedure: CYSTOSCOPY WITH STENT PLACEMENT;  Surgeon: Billey Co, MD;  Location: [facility];  Service: Urology;  Laterality: Right;   CYSTOSCOPY/URETEROSCOPY/HOLMIUM LASER/STENT PLACEMENT Right 08/11/2021   Procedure: CYSTOSCOPY/URETEROSCOPY/HOLMIUM LASER/STENT EXCHANGE;  Surgeon: Billey Co, MD;  Location: [facility];  Service: Urology;  Laterality: Right;   FRACTURE SURGERY     LAPAROSCOPIC ASSISTED VAGINAL HYSTERECTOMY  06-22-2004 [facility]'    NASAL SEPTOPLASTY W/ TURBINOPLASTY Bilateral 09/07/2013   Procedure: SEPTOPLASTY, BILATERAL TURBINATE RESECTION ;  Surgeon: Ascencion Dike, MD;  Location: [place];  Service: ENT;  Laterality: Bilateral;   OPEN REDUCTION INTERNAL FIXATION (ORIF) DISTAL RADIAL FRACTURE Right 11/16/2020   Procedure: OPEN REDUCTION INTERNAL FIXATION (ORIF) DISTAL RADIAL FRACTURE;  Surgeon: Hiram Gash, MD;  Location: [place];  Service: Orthopedics;  Laterality: Right;   PERIPHERAL VASCULAR INTERVENTION Bilateral 01/01/2019   Procedure: PERIPHERAL VASCULAR INTERVENTION;  Surgeon: Lorretta Harp, MD;  Location: [facility];  Service: Cardiovascular;  Laterality: Bilateral;   UPPER GASTROINTESTINAL ENDOSCOPY  last one 07-23-2019  dr Henrene Pastor    Social History   Socioeconomic History   Marital status: Widowed    Spouse name: Not on file   Number of  children: 1   Years of education: Not on file   Highest education level: Not on file  Occupational History   Not on file  Tobacco Use   Smoking status: Former    Packs/day: 0.50    Years: 25.00    Total pack years: 12.50    Types: Cigarettes    Start date: 45    Quit date: 06/20/2020    Years since quitting: 1.5   Smokeless tobacco: Never   Tobacco comments:    Started smoking at age [age]  Vaping Use   Vaping Use: Never used  Substance and Sexual Activity   Alcohol use: No    Alcohol/week: 0.0 standard drinks of alcohol   Drug use: No   Sexual activity: Not Currently  Other Topics Concern   Not on file  Social History Narrative    Education 12th grade.  Caffeine 4-5 drinks daily.   Lives alone   Social Determinants of Health   Financial Resource Strain: Not on file  Food Insecurity: Not on file  Transportation Needs: Not on file  Physical Activity: Not on file  Stress: Not on file  Social Connections: Not on file  Intimate Partner Violence: Not on file     Allergies  Allergen Reactions   Mirtazapine Hives and Rash   Clindamycin Hives   Macrobid [Nitrofurantoin] Hives   Other Hives    Tylox    Sulfonamide Derivatives Hives   Alendronate Sodium  Itching   Fosamax [Alendronate]     Other reaction(s): HIVES   Lipitor [Atorvastatin] Other (See Comments)    MUSCLE ACHES   Restasis [Cyclosporine]     Pt stated, "My eyes turned red on the inside and outside of eye; burning sensation"   Sulfa Antibiotics Hives   Topamax [Topiramate] Itching     Outpatient Medications Prior to Visit  Medication Sig Dispense Refill   albuterol (PROVENTIL) (2.5 MG/3ML) 0.083% nebulizer solution Take 3 mLs (2.5 mg total) by nebulization every 4 (four) hours as needed for wheezing or shortness of breath. 75 mL 3   Albuterol Sulfate, sensor, (PROAIR DIGIHALER) 108 (90 Base) MCG/ACT AEPB Inhale 2 puffs into the lungs every 6 (six) hours as needed (shortness of breath).     levocetirizine (XYZAL) 5 MG tablet Take 5 mg by mouth every morning.      montelukast (SINGULAIR) 10 MG tablet Take 10 mg by mouth at bedtime.     ACCU-CHEK COMPACT PLUS test strip 1 each by Other route as needed.      ACCU-CHEK SOFTCLIX LANCETS lancets      acetaminophen (TYLENOL) 500 MG tablet Take 1,000 mg by mouth every 6 (six) hours as needed for moderate pain.     ALPRAZolam (XANAX) 0.5 MG tablet Take 0.5-1 mg by mouth 3 (three) times daily as needed for anxiety.     AMBULATORY NON FORMULARY MEDICATION NOVOLOG INSULIN PUMP USES AS DIRECTED     aspirin EC 81 MG tablet Take 81 mg by mouth daily. Swallow whole.     Biotin w/ Vitamins C & E (HAIR/SKIN/NAILS PO) Take 1 capsule by mouth at bedtime.     Blood Glucose Monitoring Suppl (ACCU-CHEK AVIVA PLUS) w/Device KIT      budesonide (PULMICORT) 0.25 MG/2ML nebulizer solution Take 2 mLs (0.25 mg total) by nebulization 2 (two) times daily as needed (wheezing/dyspnea).     clopidogrel (PLAVIX) 75 MG tablet TAKE 1 TABLET EVERY DAY WITH BREAKFAST 90 tablet 3   estradiol (ESTRACE) 1 MG tablet  Take 1 mg by mouth at bedtime.     folic acid (FOLVITE) 1 MG tablet Take 1 tablet by mouth daily.     Insulin Human (INSULIN PUMP) SOLN Inject into the  skin. Novolog----  basal rate--- 12 am - 2 am = 1.3, 2 am - 6 am = 1.4, 6 am - 3 pm = 1.8, 3 pm - 12 am = 1.55     lansoprazole (PREVACID) 30 MG capsule Take 1 capsule (30 mg total) by mouth 2 (two) times daily before a meal. 180 capsule 1   latanoprost (XALATAN) 0.005 % ophthalmic solution Place 1 drop into both eyes at bedtime.  11   levothyroxine (SYNTHROID) 125 MCG tablet Take 125 mcg by mouth daily before breakfast.     methotrexate (RHEUMATREX) 2.5 MG tablet Take 6 tablets (15 mg total) by mouth once a week. 3 tablets am and at bedtime twice a day once a week,  Saturday's     metoprolol succinate (TOPROL-XL) 50 MG 24 hr tablet Take 50 mg by mouth daily. Take with or immediately following a meal.     NOVOLOG 100 UNIT/ML injection Inject into the skin See admin instructions. Pt has Insulin pump     Oyster Shell Calcium 500 MG TABS 1 tablet with meals     pregabalin (LYRICA) 75 MG capsule Take 75-150 mg by mouth See admin instructions. Take 75 mg in the morning, 75 mg at lunch, and 150 mg at bedtime     rosuvastatin (CRESTOR) 20 MG tablet Take 20 mg by mouth at bedtime.     Sertraline HCl 200 MG CAPS Take 200 mg by mouth daily.     Vitamin D, Ergocalciferol, (DRISDOL) 50000 UNITS CAPS Take 50,000 Units by mouth every Saturday.     No facility-administered medications prior to visit.    Review of Systems  Constitutional:  Negative for chills, fever, malaise/fatigue and weight loss.  HENT:  Negative for hearing loss, sore throat and tinnitus.   Eyes:  Negative for blurred vision and double vision.  Respiratory:  Positive for cough, shortness of breath and wheezing. Negative for hemoptysis, sputum production and stridor.   Cardiovascular:  Negative for chest pain, palpitations, orthopnea, leg swelling and PND.  Gastrointestinal:  Negative for abdominal pain, constipation, diarrhea, heartburn, nausea and vomiting.  Genitourinary:  Negative for dysuria, hematuria and urgency.  Musculoskeletal:   Negative for joint pain and myalgias.  Skin:  Negative for itching and rash.  Neurological:  Negative for dizziness, tingling, weakness and headaches.  Endo/Heme/Allergies:  Negative for environmental allergies. Does not bruise/bleed easily.  Psychiatric/Behavioral:  Negative for depression. The patient is not nervous/anxious and does not have insomnia.   All other systems reviewed and are negative.    Objective:  Physical Exam Vitals reviewed.  Constitutional:      General: She is not in acute distress.    Appearance: She is well-developed.  HENT:     Head: Normocephalic and atraumatic.  Eyes:     General: No scleral icterus.    Conjunctiva/sclera: Conjunctivae normal.     Pupils: Pupils are equal, round, and reactive to light.  Neck:     Vascular: No JVD.     Trachea: No tracheal deviation.  Cardiovascular:     Rate and Rhythm: Normal rate and regular rhythm.     Heart sounds: Normal heart sounds. No murmur heard. Pulmonary:     Effort: Pulmonary effort is normal. No tachypnea, accessory muscle usage or respiratory distress.  Breath sounds: No stridor. No wheezing, rhonchi or rales.  Abdominal:     General: There is no distension.     Palpations: Abdomen is soft.     Tenderness: There is no abdominal tenderness.  Musculoskeletal:        General: No tenderness.     Cervical back: Neck supple.  Lymphadenopathy:     Cervical: No cervical adenopathy.  Skin:    General: Skin is warm and dry.     Capillary Refill: Capillary refill takes less than 2 seconds.     Findings: No rash.  Neurological:     Mental Status: She is alert and oriented to person, place, and time.  Psychiatric:        Behavior: Behavior normal.       Vitals:   01/17/22 1556  BP: 130/80  Pulse: 80  SpO2: 96%  Weight: 163 lb 3.2 oz (74 kg)  Height: '4\' 11"'  (1.499 m)   96% on RA BMI Readings from Last 3 Encounters:  01/17/22 32.96 kg/m  01/04/22 33.43 kg/m  12/19/21 32.92 kg/m   Wt  Readings from Last 3 Encounters:  01/17/22 163 lb 3.2 oz (74 kg)  01/04/22 165 lb 8 oz (75.1 kg)  12/19/21 163 lb (73.9 kg)     CBC    Component Value Date/Time   WBC 8.8 09/07/2021 1232   RBC 4.43 09/07/2021 1232   HGB 12.4 09/07/2021 1232   HGB 11.6 01/09/2019 1204   HCT 38.4 09/07/2021 1232   HCT 36.2 01/09/2019 1204   PLT 293.0 09/07/2021 1232   PLT 388 01/09/2019 1204   MCV 86.8 09/07/2021 1232   MCV 82 01/09/2019 1204   MCH 28.3 08/15/2021 0513   MCHC 32.3 09/07/2021 1232   RDW 14.4 09/07/2021 1232   RDW 13.5 01/09/2019 1204   LYMPHSABS 3.2 09/07/2021 1232   MONOABS 0.8 09/07/2021 1232   EOSABS 0.2 09/07/2021 1232   BASOSABS 0.1 09/07/2021 1232     Chest Imaging: No recent chest imaging  CTA chest 06/21/2020: Bilateral groundglass opacities concerning for multifocal pneumonia related to COVID-19, enlarged mediastinal and hilar node consider reactive. No evidence of PE. The patient's images have been independently reviewed by me.    Pulmonary Functions Testing Results:    Latest Ref Rng & Units 02/02/2021   12:45 PM  PFT Results  FVC-Pre L 2.25   FVC-Predicted Pre % 76   FVC-Post L 2.22   FVC-Predicted Post % 75   Pre FEV1/FVC % % 85   Post FEV1/FCV % % 87   FEV1-Pre L 1.91   FEV1-Predicted Pre % 82   FEV1-Post L 1.94   DLCO uncorrected ml/min/mmHg 14.42   DLCO UNC% % 80   DLCO corrected ml/min/mmHg 14.84   DLCO COR %Predicted % 82   DLVA Predicted % 94   TLC L 3.89   TLC % Predicted % 87   RV % Predicted % 81     FeNO:   Pathology:   Echocardiogram:   Heart Catheterization:     Assessment & Plan:     ICD-10-CM   1. Moderate persistent asthma without complication  I77.82     2. OSA on CPAP  G47.33    Z99.89     3. Obesity (BMI 30-39.9)  E66.9     4. Former smoker  Z87.891     5. History of COVID-19  Z86.16       Discussion: This is a 56 year old female, history of COVID-19 bilateral  pulmonary disease on mechanical life support  for approximately 8 days several years ago.  She is a former smoker of 40 years, 20-pack-year history.  She seems to have reactive airway symptoms that are brought on by heat.  She may very well have a COPD/asthma overlap type picture.  Her immunoprofile would be most consistent with a postviral neutrophilic picture as her RAST panel and IgE are unremarkable if we are in fact dealing with persistent asthma.  She has been difficult to judge response to treatment because she has been noncompliant with any maintenance inhaler regimen we have ever given her.  Plan: Patient was counseled today heavily on use of her maintenance medications. We can put her on Trelegy 200 New prescription today for Trelegy 200 and samples if we have them. She needs to use a daily maintenance inhaler for at least 3 months to see if she is having any response to decrease in the amount of albuterol that she uses. Right now she is just using her albuterol daily and having daily symptoms brought on by changes in temperature or exposures.  If she is compliant with her Trelegy 200 over the next 3 months and still having breakthrough symptoms that I think next best step would be consideration for tezepelumab injections.  Follow-up with me or TP, NP in 3 months.    Current Outpatient Medications:    albuterol (PROVENTIL) (2.5 MG/3ML) 0.083% nebulizer solution, Take 3 mLs (2.5 mg total) by nebulization every 4 (four) hours as needed for wheezing or shortness of breath., Disp: 75 mL, Rfl: 3   Albuterol Sulfate, sensor, (PROAIR DIGIHALER) 108 (90 Base) MCG/ACT AEPB, Inhale 2 puffs into the lungs every 6 (six) hours as needed (shortness of breath)., Disp: , Rfl:    levocetirizine (XYZAL) 5 MG tablet, Take 5 mg by mouth every morning. , Disp: , Rfl:    montelukast (SINGULAIR) 10 MG tablet, Take 10 mg by mouth at bedtime., Disp: , Rfl:    ACCU-CHEK COMPACT PLUS test strip, 1 each by Other route as needed. , Disp: , Rfl:    ACCU-CHEK  SOFTCLIX LANCETS lancets, , Disp: , Rfl:    acetaminophen (TYLENOL) 500 MG tablet, Take 1,000 mg by mouth every 6 (six) hours as needed for moderate pain., Disp: , Rfl:    ALPRAZolam (XANAX) 0.5 MG tablet, Take 0.5-1 mg by mouth 3 (three) times daily as needed for anxiety., Disp: , Rfl:    AMBULATORY NON FORMULARY MEDICATION, NOVOLOG INSULIN PUMP USES AS DIRECTED, Disp: , Rfl:    aspirin EC 81 MG tablet, Take 81 mg by mouth daily. Swallow whole., Disp: , Rfl:    Biotin w/ Vitamins C & E (HAIR/SKIN/NAILS PO), Take 1 capsule by mouth at bedtime., Disp: , Rfl:    Blood Glucose Monitoring Suppl (ACCU-CHEK AVIVA PLUS) w/Device KIT, , Disp: , Rfl:    budesonide (PULMICORT) 0.25 MG/2ML nebulizer solution, Take 2 mLs (0.25 mg total) by nebulization 2 (two) times daily as needed (wheezing/dyspnea)., Disp: , Rfl:    clopidogrel (PLAVIX) 75 MG tablet, TAKE 1 TABLET EVERY DAY WITH BREAKFAST, Disp: 90 tablet, Rfl: 3   estradiol (ESTRACE) 1 MG tablet, Take 1 mg by mouth at bedtime., Disp: , Rfl:    folic acid (FOLVITE) 1 MG tablet, Take 1 tablet by mouth daily., Disp: , Rfl:    Insulin Human (INSULIN PUMP) SOLN, Inject into the skin. Novolog----  basal rate--- 12 am - 2 am = 1.3, 2 am - 6 am =  1.4, 6 am - 3 pm = 1.8, 3 pm - 12 am = 1.55, Disp: , Rfl:    lansoprazole (PREVACID) 30 MG capsule, Take 1 capsule (30 mg total) by mouth 2 (two) times daily before a meal., Disp: 180 capsule, Rfl: 1   latanoprost (XALATAN) 0.005 % ophthalmic solution, Place 1 drop into both eyes at bedtime., Disp: , Rfl: 11   levothyroxine (SYNTHROID) 125 MCG tablet, Take 125 mcg by mouth daily before breakfast., Disp: , Rfl:    methotrexate (RHEUMATREX) 2.5 MG tablet, Take 6 tablets (15 mg total) by mouth once a week. 3 tablets am and at bedtime twice a day once a week,  Saturday's, Disp: , Rfl:    metoprolol succinate (TOPROL-XL) 50 MG 24 hr tablet, Take 50 mg by mouth daily. Take with or immediately following a meal., Disp: , Rfl:     NOVOLOG 100 UNIT/ML injection, Inject into the skin See admin instructions. Pt has Insulin pump, Disp: , Rfl:    Oyster Shell Calcium 500 MG TABS, 1 tablet with meals, Disp: , Rfl:    pregabalin (LYRICA) 75 MG capsule, Take 75-150 mg by mouth See admin instructions. Take 75 mg in the morning, 75 mg at lunch, and 150 mg at bedtime, Disp: , Rfl:    rosuvastatin (CRESTOR) 20 MG tablet, Take 20 mg by mouth at bedtime., Disp: , Rfl:    Sertraline HCl 200 MG CAPS, Take 200 mg by mouth daily., Disp: , Rfl:    Vitamin D, Ergocalciferol, (DRISDOL) 50000 UNITS CAPS, Take 50,000 Units by mouth every Saturday., Disp: , Rfl:    Garner Nash, DO [facility] 01/17/2022 4:04 PM

## 2022-01-17 NOTE — Patient Instructions (Addendum)
Thank you for visiting Dr. Valeta Harms at [facility]. Today we recommend the following:  Trelegy 200 samples  New prescription for trelegy 246mg  Continue pulmicort   Return in about 3 months (around 04/19/2022) for with TRexene Edison NP or Dr. IValeta Harms    Please do your part to reduce the spread of COVID-19.

## 2022-01-17 NOTE — Addendum Note (Signed)
Addended by: Alvin Critchley on: 01/17/2022 04:44 PM   Modules accepted: Orders

## 2022-01-19 DIAGNOSIS — Z79899 Other long term (current) drug therapy: Secondary | ICD-10-CM | POA: Diagnosis not present

## 2022-01-19 DIAGNOSIS — H30033 Focal chorioretinal inflammation, peripheral, bilateral: Secondary | ICD-10-CM | POA: Diagnosis not present

## 2022-01-30 DIAGNOSIS — Z961 Presence of intraocular lens: Secondary | ICD-10-CM | POA: Diagnosis not present

## 2022-01-30 DIAGNOSIS — E109 Type 1 diabetes mellitus without complications: Secondary | ICD-10-CM | POA: Diagnosis not present

## 2022-01-30 DIAGNOSIS — H3581 Retinal edema: Secondary | ICD-10-CM | POA: Diagnosis not present

## 2022-01-30 DIAGNOSIS — H30033 Focal chorioretinal inflammation, peripheral, bilateral: Secondary | ICD-10-CM | POA: Diagnosis not present

## 2022-01-30 DIAGNOSIS — Z79899 Other long term (current) drug therapy: Secondary | ICD-10-CM | POA: Diagnosis not present

## 2022-02-01 DIAGNOSIS — E10649 Type 1 diabetes mellitus with hypoglycemia without coma: Secondary | ICD-10-CM | POA: Diagnosis not present

## 2022-02-05 DIAGNOSIS — E559 Vitamin D deficiency, unspecified: Secondary | ICD-10-CM | POA: Diagnosis not present

## 2022-02-05 DIAGNOSIS — E039 Hypothyroidism, unspecified: Secondary | ICD-10-CM | POA: Diagnosis not present

## 2022-02-05 DIAGNOSIS — Z9641 Presence of insulin pump (external) (internal): Secondary | ICD-10-CM | POA: Diagnosis not present

## 2022-02-05 DIAGNOSIS — M810 Age-related osteoporosis without current pathological fracture: Secondary | ICD-10-CM | POA: Diagnosis not present

## 2022-02-05 DIAGNOSIS — K219 Gastro-esophageal reflux disease without esophagitis: Secondary | ICD-10-CM | POA: Diagnosis not present

## 2022-02-05 DIAGNOSIS — I10 Essential (primary) hypertension: Secondary | ICD-10-CM | POA: Diagnosis not present

## 2022-02-05 DIAGNOSIS — I2510 Atherosclerotic heart disease of native coronary artery without angina pectoris: Secondary | ICD-10-CM | POA: Diagnosis not present

## 2022-02-05 DIAGNOSIS — E1065 Type 1 diabetes mellitus with hyperglycemia: Secondary | ICD-10-CM | POA: Diagnosis not present

## 2022-02-05 DIAGNOSIS — Z7952 Long term (current) use of systemic steroids: Secondary | ICD-10-CM | POA: Diagnosis not present

## 2022-02-08 ENCOUNTER — Ambulatory Visit
Admission: RE | Admit: 2022-02-08 | Discharge: 2022-02-08 | Disposition: A | Discharge status: Home / Self Care | Payer: Medicare HMO | Source: Ambulatory Visit

## 2022-02-08 DIAGNOSIS — Z87891 Personal history of nicotine dependence: Secondary | ICD-10-CM

## 2022-02-09 DIAGNOSIS — M65312 Trigger thumb, left thumb: Secondary | ICD-10-CM | POA: Diagnosis not present

## 2022-02-09 DIAGNOSIS — M25562 Pain in left knee: Secondary | ICD-10-CM | POA: Diagnosis not present

## 2022-02-14 ENCOUNTER — Telehealth: Payer: Self-pay | Admitting: Acute Care

## 2022-02-14 DIAGNOSIS — Z122 Encounter for screening for malignant neoplasm of respiratory organs: Secondary | ICD-10-CM

## 2022-02-14 DIAGNOSIS — Z87891 Personal history of nicotine dependence: Secondary | ICD-10-CM

## 2022-02-14 NOTE — Telephone Encounter (Signed)
I have attempted to call the patient with the results of their  Low Dose CT Chest Lung cancer screening scan. There was no answer. I have left a HIPPA compliant VM requesting the patient call the office for the scan results. I included the office contact information in the message. We will await his return call. If no return call we will continue to call until patient is contacted.    Langley Gauss, it is ok for you to call this one, or give her the results if she calls back. Ask her if she has been sick, tell her there is notation of some mucus, and if she needs to be seen for an acute visit,  we can schedule her with Tammy P, or any available APP . ( I know she sees Tammy) . If she feels ok, she does not need follow up, except her annual screening scan. Please fax results to PCP. Thanks so much

## 2022-02-16 NOTE — Telephone Encounter (Signed)
Spoke with Ashley [name] and reviewed CT results with her. I advised Ashley [name] that there weren't any suspicious nodules seen that would be concerning for cancer. I asked Ashley [name] if she had been sick lately. She did say that around 3 weeks ago she was sick with cough and congestion. She went to see her PCP and took a course of Omnicef and a cough med. She said that she got some better but recently started having cough again with some wheezing. I advised Ashley [name] to continue taking the Trelegy and to use Albuterol neb or inhaler as needed for the wheezing. I offered to schedule the Ashley [name] an appt with Dr Valeta Harms or Tammy Parrett,NP. Ashley [name] wants to wait for now because she feels like she is getting better. I advised Ashley [name] that if she doesn't feel better by next week to call our office for an appt.CT Results faxed to PCP. Order placed for 12 mth f/u lung screening CT.

## 2022-02-21 DIAGNOSIS — E10649 Type 1 diabetes mellitus with hypoglycemia without coma: Secondary | ICD-10-CM | POA: Diagnosis not present

## 2022-02-21 DIAGNOSIS — R0602 Shortness of breath: Secondary | ICD-10-CM | POA: Diagnosis not present

## 2022-02-21 DIAGNOSIS — J439 Emphysema, unspecified: Secondary | ICD-10-CM | POA: Diagnosis not present

## 2022-02-21 DIAGNOSIS — J189 Pneumonia, unspecified organism: Secondary | ICD-10-CM | POA: Diagnosis not present

## 2022-02-21 DIAGNOSIS — R051 Acute cough: Secondary | ICD-10-CM | POA: Diagnosis not present

## 2022-02-22 ENCOUNTER — Other Ambulatory Visit: Payer: Self-pay

## 2022-02-22 ENCOUNTER — Telehealth: Payer: Self-pay | Admitting: Pulmonary Disease

## 2022-02-22 IMAGING — CR DG CHEST 2V
1 series · 2 of 2 positions shown · non-contrast
Comparison: 05/05/2020 chest x-ray and CT chest.

CLINICAL DATA: Shortness of breath, COVID positive. Fever and body
aches.

EXAM:
CHEST - 2 VIEW

[Series 1: w chest pa · 0.14mm/px · 2 of 2 slices shown]
[im 1/2]
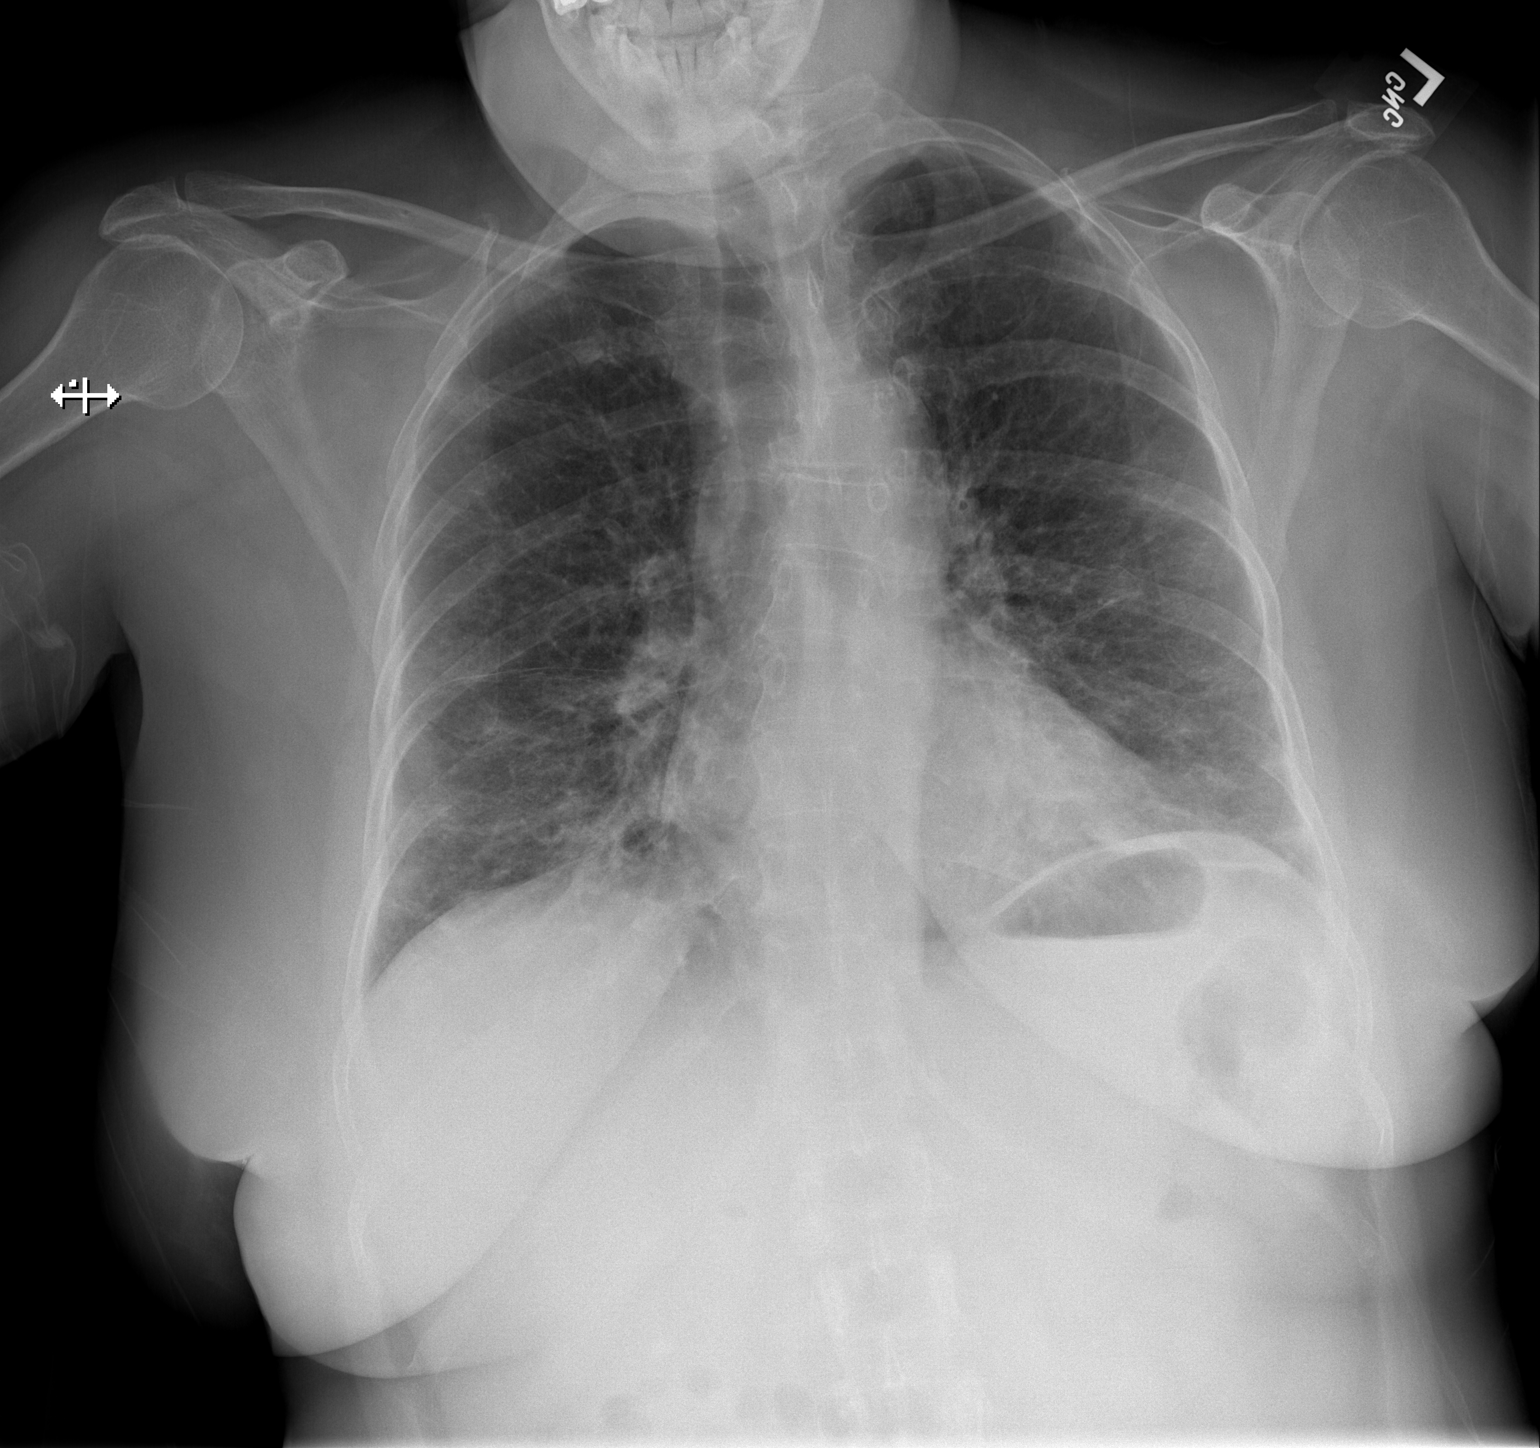
[im 2/2]
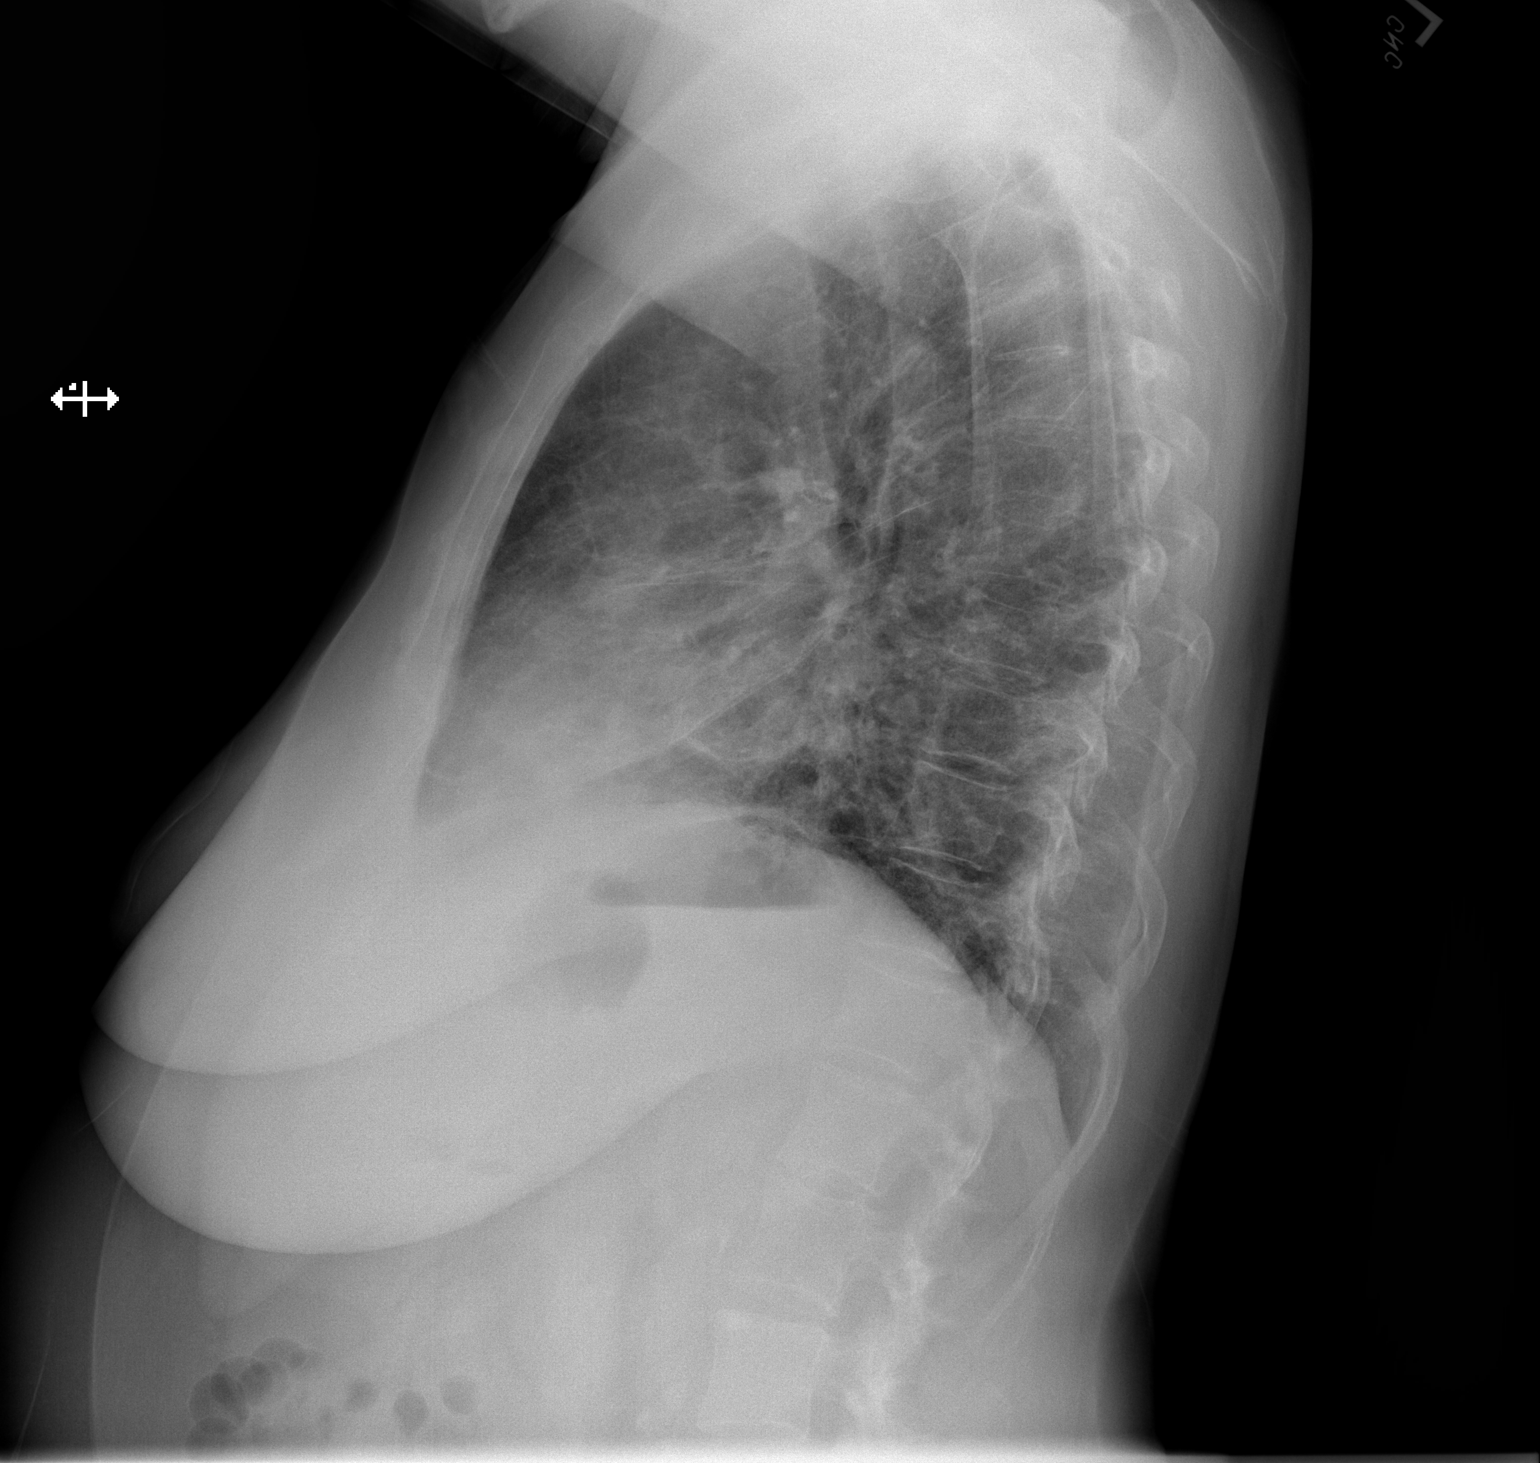

[2 of 2 positions shown; findings below may reference images not displayed]

FINDINGS: Patient is slightly rotated. Trachea is midline. Heart size normal.
Basilar dependent peribronchovascular airspace opacities. No pleural
fluid.
IMPRESSION: YNYZM-HB pneumonia.

## 2022-02-22 MED ORDER — TRELEGY ELLIPTA 200-62.5-25 MCG/ACT IN AEPB: 1.0000 | INHALATION_SPRAY | Freq: Every day | RESPIRATORY_TRACT | 0 refills | Status: DC

## 2022-02-22 NOTE — Progress Notes (Signed)
Trelegy sample 

## 2022-02-22 NOTE — Telephone Encounter (Signed)
I called and spoke with patient, she states that she was given samples of Trelegy, she was told that the Trelegy would be $518 for 3 inhalers and she cannot afford that.  Advised that I would let Dr. Valeta Harms know about the Trelegy.  I advised her to call her insurance company to see what is on their preferred list that is comparable to Trelegy.  She stated she would call.  I let her know I would have someone from the [place] office put 2 Trelegy 200 samples up front for her and she can pick them up on Monday.  She verbalized understanding.    Dr. Valeta Harms, Please be aware that the patient cannot afford the Trelegy 200, it is $518.  She is going to call her insurance company to find out what is on their preferred list and then she will call us back with that list.  This is just an FYI.  Thank you.

## 2022-02-22 NOTE — Telephone Encounter (Signed)
Can we run a ticket to see what is covered by insurance  Thank you

## 2022-02-22 NOTE — Telephone Encounter (Signed)
Patient went to PCP yesterday and has pneumonia and was put on an antibiotic, mucinex DM 2 times a day, cough medicine and was given an injection.   Patient would like to know if we have any samples of trelegy.   Please call back at [phone] and it is okay to leave a message.

## 2022-02-23 ENCOUNTER — Other Ambulatory Visit (HOSPITAL_COMMUNITY): Payer: Self-pay

## 2022-02-23 IMAGING — DX DG CHEST 1V PORT
1 series · 1 of 1 positions shown · non-contrast
Comparison: June 20, 2020

CLINICAL DATA: Weakness.  Recent 48QO2-MC positive

EXAM:
PORTABLE CHEST 1 VIEW

[chest ap]
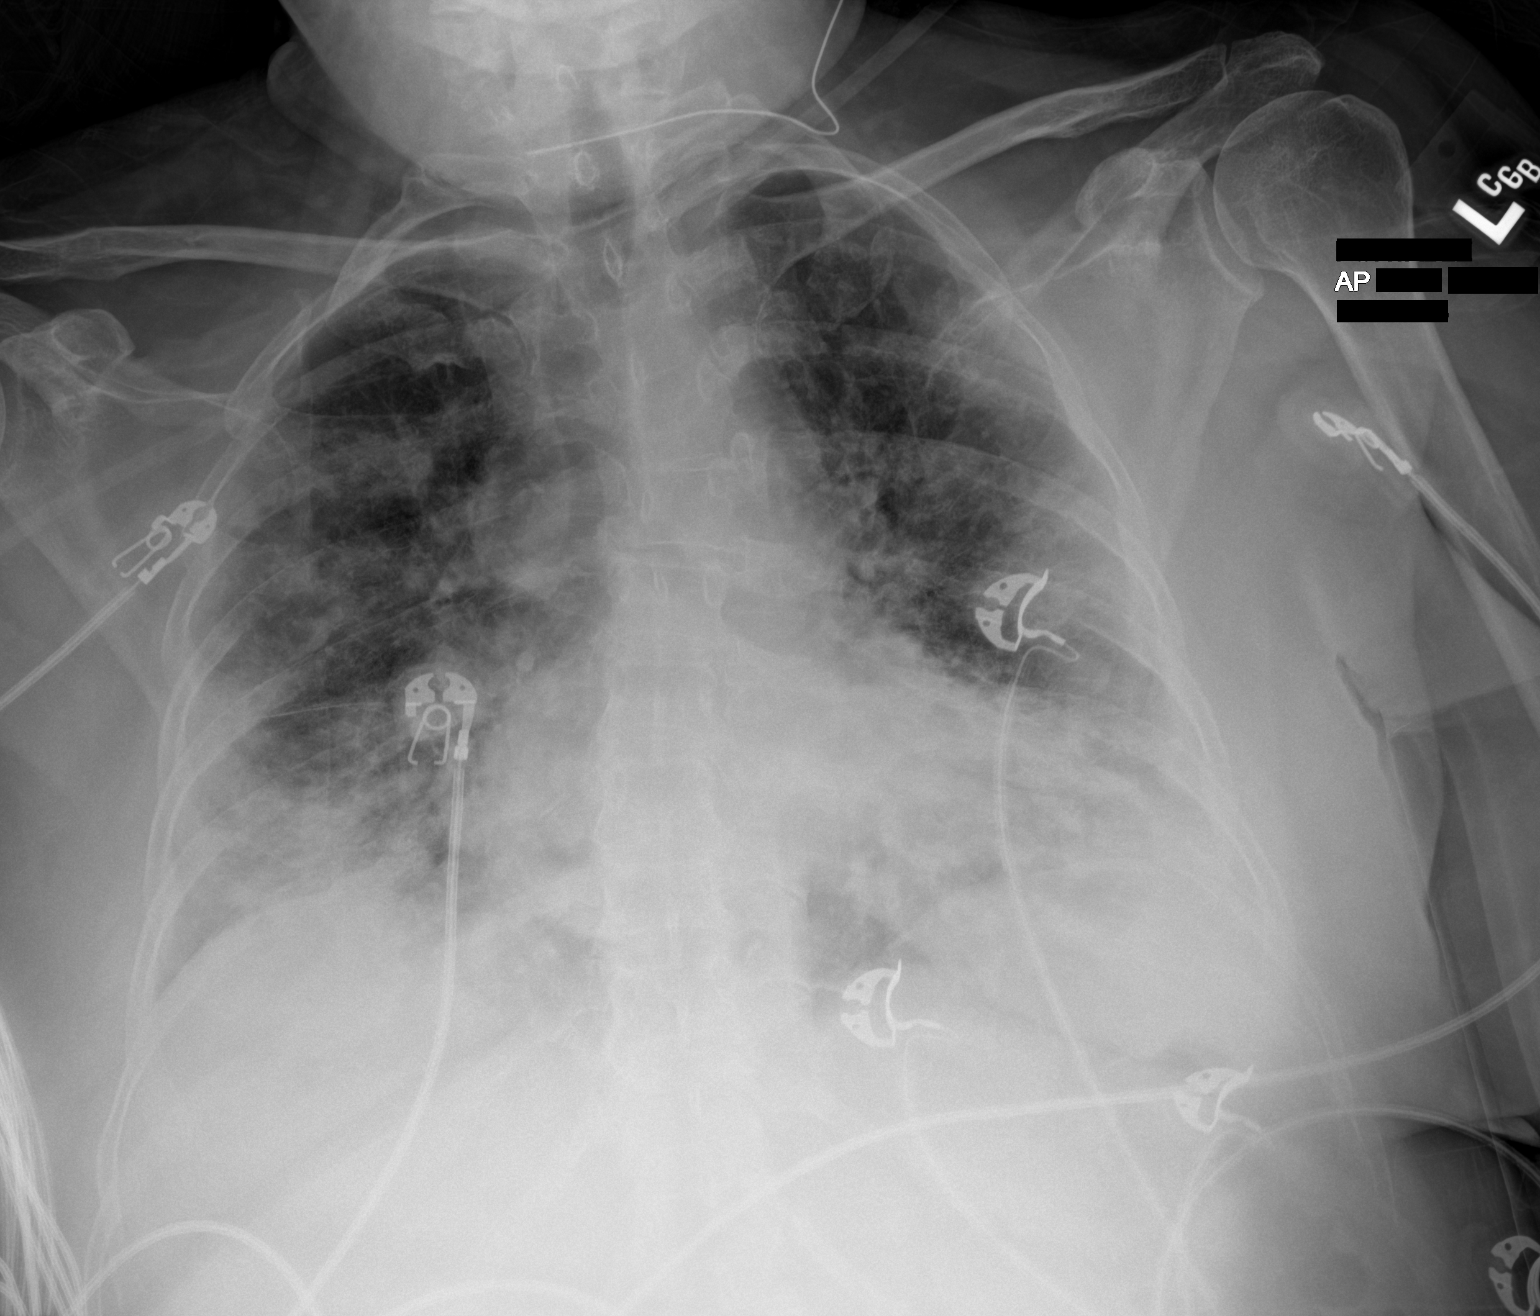

[1 of 1 positions shown; findings below may reference images not displayed]

FINDINGS: There are now multiple areas of airspace opacity throughout the lung
bases as well as in the right upper lobe and to a lesser extent left
upper lobe and mid lung regions. Heart is upper normal in size with
pulmonary vascularity normal. No adenopathy. No bone lesions.
IMPRESSION: Multiple foci of airspace opacity consistent with multifocal
pneumonia, likely of atypical organism etiology. There has been
significant increase in areas of opacity compared to 1 day prior.
Heart upper normal in size. No adenopathy evident.

## 2022-02-23 NOTE — Telephone Encounter (Signed)
Test billing indicates that she is in the the donut hole and this is effecting co pays  Test billing for this patient's plan returns the following results for ICS/LABA/LAMA products:   Trelegy 1 month supply is 122.95 3 month 554.56 Breztri  1 month supply is 119.69 3 month 544.79

## 2022-02-23 NOTE — Telephone Encounter (Signed)
Dr Valeta Harms please note message from pharmacy below:  Test billing indicates that she is in the the donut hole and this is effecting co pays   Test billing for this patient's plan returns the following results for ICS/LABA/LAMA products:   Trelegy 1 month supply is 122.95 3 month 554.56 Breztri  1 month supply is 119.69 3 month 544.79

## 2022-02-23 NOTE — Telephone Encounter (Signed)
Samples placed upfront from Trelegy at [facility] office. Nira Conn spoke to patient yesterday. Nothing further needed

## 2022-02-26 DIAGNOSIS — I10 Essential (primary) hypertension: Secondary | ICD-10-CM | POA: Diagnosis not present

## 2022-02-26 DIAGNOSIS — E1040 Type 1 diabetes mellitus with diabetic neuropathy, unspecified: Secondary | ICD-10-CM | POA: Diagnosis not present

## 2022-02-26 DIAGNOSIS — G4733 Obstructive sleep apnea (adult) (pediatric): Secondary | ICD-10-CM | POA: Diagnosis not present

## 2022-02-26 DIAGNOSIS — J439 Emphysema, unspecified: Secondary | ICD-10-CM | POA: Diagnosis not present

## 2022-02-26 DIAGNOSIS — E785 Hyperlipidemia, unspecified: Secondary | ICD-10-CM | POA: Diagnosis not present

## 2022-02-26 DIAGNOSIS — I739 Peripheral vascular disease, unspecified: Secondary | ICD-10-CM | POA: Diagnosis not present

## 2022-03-04 ENCOUNTER — Emergency Department (HOSPITAL_BASED_OUTPATIENT_CLINIC_OR_DEPARTMENT_OTHER)
Admission: EM | Admit: 2022-03-04 | Discharge: 2022-03-04 | Disposition: A | Discharge status: Home / Self Care | Payer: Medicare HMO | Attending: Emergency Medicine | Admitting: Emergency Medicine

## 2022-03-04 ENCOUNTER — Encounter (HOSPITAL_BASED_OUTPATIENT_CLINIC_OR_DEPARTMENT_OTHER): Payer: Self-pay

## 2022-03-04 ENCOUNTER — Other Ambulatory Visit: Payer: Self-pay

## 2022-03-04 ENCOUNTER — Emergency Department (HOSPITAL_BASED_OUTPATIENT_CLINIC_OR_DEPARTMENT_OTHER): Payer: Medicare HMO | Admitting: Radiology

## 2022-03-04 DIAGNOSIS — J168 Pneumonia due to other specified infectious organisms: Secondary | ICD-10-CM | POA: Insufficient documentation

## 2022-03-04 DIAGNOSIS — J069 Acute upper respiratory infection, unspecified: Secondary | ICD-10-CM | POA: Diagnosis not present

## 2022-03-04 DIAGNOSIS — J45909 Unspecified asthma, uncomplicated: Secondary | ICD-10-CM | POA: Diagnosis not present

## 2022-03-04 DIAGNOSIS — B9789 Other viral agents as the cause of diseases classified elsewhere: Secondary | ICD-10-CM | POA: Diagnosis not present

## 2022-03-04 DIAGNOSIS — Z7982 Long term (current) use of aspirin: Secondary | ICD-10-CM | POA: Diagnosis not present

## 2022-03-04 DIAGNOSIS — J449 Chronic obstructive pulmonary disease, unspecified: Secondary | ICD-10-CM | POA: Diagnosis not present

## 2022-03-04 DIAGNOSIS — Z7902 Long term (current) use of antithrombotics/antiplatelets: Secondary | ICD-10-CM | POA: Diagnosis not present

## 2022-03-04 DIAGNOSIS — R059 Cough, unspecified: Secondary | ICD-10-CM | POA: Diagnosis not present

## 2022-03-04 DIAGNOSIS — Z20822 Contact with and (suspected) exposure to covid-19: Secondary | ICD-10-CM | POA: Diagnosis not present

## 2022-03-04 DIAGNOSIS — Z794 Long term (current) use of insulin: Secondary | ICD-10-CM | POA: Insufficient documentation

## 2022-03-04 DIAGNOSIS — Z7951 Long term (current) use of inhaled steroids: Secondary | ICD-10-CM | POA: Insufficient documentation

## 2022-03-04 DIAGNOSIS — J189 Pneumonia, unspecified organism: Secondary | ICD-10-CM

## 2022-03-04 DIAGNOSIS — R051 Acute cough: Secondary | ICD-10-CM | POA: Diagnosis not present

## 2022-03-04 LAB — CBC WITH DIFFERENTIAL/PLATELET
Abs Immature Granulocytes: 0.04 10*3/uL (ref 0.00–0.07)
Basophils Absolute: 0 10*3/uL (ref 0.0–0.1)
Basophils Relative: 0 %
Eosinophils Absolute: 0.1 10*3/uL (ref 0.0–0.5)
Eosinophils Relative: 1 %
HCT: 38.7 % (ref 36.0–46.0)
Hemoglobin: 12.5 g/dL (ref 12.0–15.0)
Immature Granulocytes: 0 %
Lymphocytes Relative: 13 %
Lymphs Abs: 1.3 10*3/uL (ref 0.7–4.0)
MCH: 28.6 pg (ref 26.0–34.0)
MCHC: 32.3 g/dL (ref 30.0–36.0)
MCV: 88.6 fL (ref 80.0–100.0)
Monocytes Absolute: 0.7 10*3/uL (ref 0.1–1.0)
Monocytes Relative: 6 %
Neutro Abs: 8.2 10*3/uL — ABNORMAL HIGH (ref 1.7–7.7)
Neutrophils Relative %: 80 %
Platelets: 229 10*3/uL (ref 150–400)
RBC: 4.37 MIL/uL (ref 3.87–5.11)
RDW: 16.2 % — ABNORMAL HIGH (ref 11.5–15.5)
WBC: 10.4 10*3/uL (ref 4.0–10.5)
nRBC: 0 % (ref 0.0–0.2)

## 2022-03-04 LAB — BASIC METABOLIC PANEL
Anion gap: 10 (ref 5–15)
BUN: 11 mg/dL (ref 6–20)
CO2: 27 mmol/L (ref 22–32)
Calcium: 9.6 mg/dL (ref 8.9–10.3)
Chloride: 100 mmol/L (ref 98–111)
Creatinine, Ser: 0.99 mg/dL (ref 0.44–1.00)
GFR, Estimated: >60 mL/min (ref >60)
Glucose, Bld: 177 mg/dL — ABNORMAL HIGH (ref 70–99)
Potassium: 3.9 mmol/L (ref 3.5–5.1)
Sodium: 137 mmol/L (ref 135–145)

## 2022-03-04 LAB — RESP PANEL BY RT-PCR (FLU A&B, COVID) ARPGX2
Influenza A by PCR: NEGATIVE
Influenza B by PCR: NEGATIVE
SARS Coronavirus 2 by RT PCR: NEGATIVE

## 2022-03-04 LAB — LACTIC ACID, PLASMA: Lactic Acid, Venous: 1.7 mmol/L (ref 0.5–1.9)

## 2022-03-04 MED ORDER — DOXYCYCLINE HYCLATE 100 MG PO CAPS: 100.0000 mg | ORAL_CAPSULE | Freq: Two times a day (BID) | ORAL | 0 refills | Status: DC

## 2022-03-04 MED ORDER — IPRATROPIUM-ALBUTEROL 0.5-2.5 (3) MG/3ML IN SOLN
3.0000 mL | RESPIRATORY_TRACT | Status: DC
  Administered 2022-03-04: 3 mL via RESPIRATORY_TRACT
  Filled 2022-03-04: qty 3

## 2022-03-04 MED ORDER — SODIUM CHLORIDE 0.9 % IV SOLN
500.0000 mg | Freq: Once | INTRAVENOUS | Status: AC
  Administered 2022-03-04: 500 mg via INTRAVENOUS
  Filled 2022-03-04: qty 5

## 2022-03-04 MED ORDER — DM-GUAIFENESIN ER 30-600 MG PO TB12: 1.0000 | ORAL_TABLET | Freq: Two times a day (BID) | ORAL | 0 refills | Status: AC

## 2022-03-04 MED ORDER — PREDNISONE 20 MG PO TABS: 40.0000 mg | ORAL_TABLET | Freq: Every day | ORAL | 0 refills | Status: AC

## 2022-03-04 MED ORDER — SODIUM CHLORIDE 0.9 % IV SOLN
1.0000 g | Freq: Once | INTRAVENOUS | Status: AC
  Administered 2022-03-04: 1 g via INTRAVENOUS
  Filled 2022-03-04: qty 10

## 2022-03-04 MED ORDER — AEROCHAMBER PLUS FLO-VU MISC
1.0000 | Freq: Once | Status: AC
  Administered 2022-03-04: 1

## 2022-03-04 MED ORDER — DEXAMETHASONE SODIUM PHOSPHATE 10 MG/ML IJ SOLN
10.0000 mg | Freq: Once | INTRAMUSCULAR | Status: AC
  Administered 2022-03-04: 10 mg via INTRAMUSCULAR
  Filled 2022-03-04: qty 1

## 2022-03-04 MED ORDER — CEFDINIR 300 MG PO CAPS: 300.0000 mg | ORAL_CAPSULE | Freq: Two times a day (BID) | ORAL | 0 refills | Status: AC

## 2022-03-04 NOTE — ED Notes (Signed)
RT educated the pt on proper use of MDI w/spacer. Pt able to perform w/out difficulty. Pt respiratory status stable w/no distress noted at this time.

## 2022-03-04 NOTE — ED Triage Notes (Signed)
Pt states she she has had a cough . Pt states she was given Levaquin and cough medicine for mucous in her lungs. Pt states she has not gotten better after taking meds.

## 2022-03-04 NOTE — ED Provider Notes (Signed)
[facility] EMERGENCY DEPT Provider Note   CSN: [id number] Arrival date & time: 03/04/22  1610     History  Chief Complaint  Patient presents with  . Cough    Ashley [name] is a 56 y.o. female who presents emergency department with concerns for cough onset 1.5 months.  Notes that she was recently treated for 7 days with Levaquin for pneumonia.  Was given a steroid injection without relief for her symptoms.  Has tried her nebulizer at home.  Has associated chills, nasal congestion, rhinorrhea, chest tightness, cough, shortness of breath. Has a history of asthma and COPD.  Also notes that she was given Hycodan with relief of her symptoms.  Also given promethazine for her cough with relief.  No meds tried at home.   The history is provided by the patient. No language interpreter was used.  Cough Associated symptoms: chills, rhinorrhea and shortness of breath   Associated symptoms: no chest pain        Home Medications Prior to Admission medications   Medication Sig Start Date End Date Taking? Authorizing Provider  cefdinir (OMNICEF) 300 MG capsule Take 1 capsule (300 mg total) by mouth 2 (two) times daily for 10 days. 03/04/22 03/14/22 Yes Inette Doubrava A, PA-C  dextromethorphan-guaiFENesin (MUCINEX DM) 30-600 MG 12hr tablet Take 1 tablet by mouth 2 (two) times daily. 03/04/22 04/03/22 Yes Gayl Ivanoff A, PA-C  doxycycline (VIBRAMYCIN) 100 MG capsule Take 1 capsule (100 mg total) by mouth 2 (two) times daily. 03/04/22  Yes Peggie Hornak A, PA-C  predniSONE (DELTASONE) 20 MG tablet Take 2 tablets (40 mg total) by mouth daily for 5 days. 03/04/22 03/09/22 Yes Lexani Corona A, PA-C  ACCU-CHEK COMPACT PLUS test strip 1 each by Other route as needed.  10/20/12   [provider]  ACCU-CHEK SOFTCLIX LANCETS lancets  05/19/18   [provider]  acetaminophen (TYLENOL) 500 MG tablet Take 1,000 mg by mouth every 6 (six) hours as needed for moderate pain.     [provider]  albuterol (PROVENTIL) (2.5 MG/3ML) 0.083% nebulizer solution Take 3 mLs (2.5 mg total) by nebulization every 4 (four) hours as needed for wheezing or shortness of breath. 06/07/21   Icard, Octavio Graves, DO  Albuterol Sulfate, sensor, (PROAIR DIGIHALER) 108 (90 Base) MCG/ACT AEPB Inhale 2 puffs into the lungs every 6 (six) hours as needed (shortness of breath).    [provider]  ALPRAZolam Duanne Moron) 0.5 MG tablet Take 0.5-1 mg by mouth 3 (three) times daily as needed for anxiety. 10/08/12   [provider]  AMBULATORY NON [place] INSULIN PUMP USES AS DIRECTED    [provider]  aspirin EC 81 MG tablet Take 81 mg by mouth daily. Swallow whole.    [provider]  Biotin w/ Vitamins C & E (HAIR/SKIN/NAILS PO) Take 1 capsule by mouth at bedtime.    [provider]  Blood Glucose Monitoring Suppl (ACCU-CHEK AVIVA PLUS) w/Device KIT  06/08/20   [provider]  budesonide (PULMICORT) 0.25 MG/2ML nebulizer solution Take 2 mLs (0.25 mg total) by nebulization 2 (two) times daily as needed (wheezing/dyspnea). 01/17/22 04/17/22  Garner Nash, DO  clopidogrel (PLAVIX) 75 MG tablet TAKE 1 TABLET EVERY DAY WITH BREAKFAST 06/26/21   Lorretta Harp, MD  estradiol (ESTRACE) 1 MG tablet Take 1 mg by mouth at bedtime.    [provider]  Fluticasone-Umeclidin-Vilant (TRELEGY ELLIPTA) 100-62.5-25 MCG/ACT AEPB Inhale 1 each into the lungs daily.  01/17/22   Garner Nash, DO  Fluticasone-Umeclidin-Vilant (TRELEGY ELLIPTA) 200-62.5-25 MCG/ACT AEPB Inhale 1 puff into the lungs daily. 01/17/22 04/17/22  Garner Nash, DO  Fluticasone-Umeclidin-Vilant (TRELEGY ELLIPTA) 200-62.5-25 MCG/ACT AEPB Inhale 1 puff into the lungs daily. 02/22/22   Suzan Nailer, CNM  folic acid (FOLVITE) 1 MG tablet Take 1 tablet by mouth daily. 06/23/19   [provider]  Insulin Human (INSULIN PUMP) SOLN Inject into the skin.  Novolog----  basal rate--- 12 am - 2 am = 1.3, 2 am - 6 am = 1.4, 6 am - 3 pm = 1.8, 3 pm - 12 am = 1.55    [provider]  lansoprazole (PREVACID) 30 MG capsule Take 1 capsule (30 mg total) by mouth 2 (two) times daily before a meal. 12/13/20   Irene Shipper, MD  latanoprost (XALATAN) 0.005 % ophthalmic solution Place 1 drop into both eyes at bedtime. 12/26/17   [provider]  levocetirizine (XYZAL) 5 MG tablet Take 5 mg by mouth every morning.     [provider]  levothyroxine (SYNTHROID) 125 MCG tablet Take 125 mcg by mouth daily before breakfast.    [provider]  methotrexate (RHEUMATREX) 2.5 MG tablet Take 6 tablets (15 mg total) by mouth once a week. 3 tablets am and at bedtime twice a day once a week,  Saturday's 08/01/21   Aline August, MD  metoprolol succinate (TOPROL-XL) 50 MG 24 hr tablet Take 50 mg by mouth daily. Take with or immediately following a meal.    [provider]  montelukast (SINGULAIR) 10 MG tablet Take 10 mg by mouth at bedtime.    [provider]  NOVOLOG 100 UNIT/ML injection Inject into the skin See admin instructions. Pt has Insulin pump 08/06/18   [provider]  Loma [place] Calcium 500 MG TABS 1 tablet with meals 09/25/21   [provider]  pregabalin (LYRICA) 75 MG capsule Take 75-150 mg by mouth See admin instructions. Take 75 mg in the morning, 75 mg at lunch, and 150 mg at bedtime 07/24/18   [provider]  rosuvastatin (CRESTOR) 20 MG tablet Take 20 mg by mouth at bedtime.    [provider]  Sertraline HCl 200 MG CAPS Take 200 mg by mouth daily.    [provider]  Vitamin D, Ergocalciferol, (DRISDOL) 50000 UNITS CAPS Take 50,000 Units by mouth every Saturday.    [provider]      Allergies    Mirtazapine, Clindamycin, Macrobid [nitrofurantoin], Other, Sulfonamide derivatives, Alendronate sodium, Fosamax [alendronate], Lipitor [atorvastatin],  Restasis [cyclosporine], Sulfa antibiotics, and Topamax [topiramate]    Review of Systems   Review of Systems  Constitutional:  Positive for chills.  HENT:  Positive for congestion and rhinorrhea.   Respiratory:  Positive for cough, chest tightness and shortness of breath.   Cardiovascular:  Negative for chest pain.  All other systems reviewed and are negative.   Physical Exam Updated Vital Signs BP (!) 124/57 (BP Location: Right Arm)   Pulse (!) 106   Temp 98.5 F (36.9 C) (Oral)   Resp 18   Ht _0  (1.473 m)   Wt 73 kg   SpO2 95%   BMI 33.65 kg/m  Physical Exam Vitals and nursing note reviewed.  Constitutional:      General: She is not in acute distress.    Appearance: She is not diaphoretic.  HENT:     Head: Normocephalic and atraumatic.  Mouth/Throat:     Pharynx: No oropharyngeal exudate.  Eyes:     General: No scleral icterus.    Conjunctiva/sclera: Conjunctivae normal.  Cardiovascular:     Rate and Rhythm: Normal rate and regular rhythm.     Pulses: Normal pulses.     Heart sounds: Normal heart sounds.  Pulmonary:     Effort: Pulmonary effort is normal. No respiratory distress.     Breath sounds: Normal breath sounds. Decreased air movement present. No wheezing.     Comments: Decreased breath sounds noted throughout lung fields.  Patient able to speak in clear complete sentences. Abdominal:     General: Bowel sounds are normal.     Palpations: Abdomen is soft. There is no mass.     Tenderness: There is no abdominal tenderness. There is no guarding or rebound.  Musculoskeletal:        General: Normal range of motion.     Cervical back: Normal range of motion and neck supple.  Skin:    General: Skin is warm and dry.  Neurological:     Mental Status: She is alert.  Psychiatric:        Behavior: Behavior normal.     ED Results / Procedures / Treatments   Labs (all labs ordered are listed, but only abnormal results are displayed) Labs Reviewed   BASIC METABOLIC PANEL - Abnormal; Notable for the following components:      Result Value   Glucose, Bld 177 (*)    All other components within normal limits  CBC WITH DIFFERENTIAL/PLATELET - Abnormal; Notable for the following components:   RDW 16.2 (*)    Neutro Abs 8.2 (*)    All other components within normal limits  RESP PANEL BY RT-PCR (FLU A&B, COVID) ARPGX2  CULTURE, BLOOD (ROUTINE X 2)  LACTIC ACID, PLASMA    EKG None  Radiology DG Chest 2 View  Result Date: 03/04/2022 CLINICAL DATA:  Persistent cough EXAM: CHEST - 2 VIEW COMPARISON:  08/13/2021, CT 02/08/2022 FINDINGS: No pleural effusion or pneumothorax. Suggestion mild vague patchy right greater than left airspace opacities. Normal cardiac size. No pneumothorax IMPRESSION: Suspicion of subtle heterogeneous right greater than left airspace opacities as may be seen with pneumonia, to include atypical or viral process. Electronically Signed   By: Donavan Foil M.D.   On: 03/04/2022 18:00    Procedures Procedures    Medications Ordered in ED Medications  dexamethasone (DECADRON) injection 10 mg (10 mg Intramuscular Given 03/04/22 1929)  cefTRIAXone (ROCEPHIN) 1 g in sodium chloride 0.9 % 100 mL IVPB (0 g Intravenous Stopped 03/04/22 2130)  azithromycin (ZITHROMAX) 500 mg in sodium chloride 0.9 % 250 mL IVPB (0 mg Intravenous Stopped 03/04/22 2209)  aerochamber plus with mask device 1 each (1 each Other Given 03/04/22 2229)    ED Course/ Medical Decision Making/ A&P                            Medical Decision Making Amount and/or Complexity of Data Reviewed Labs: ordered. Radiology: ordered.  Risk OTC drugs. Prescription drug management.   Pt presents with concerns for cough onset 1.5 months.  Denies sick contacts.  Has been treated with antibiotics at home (Levaquin).  Patient afebrile today.  On exam patient with decreased breath sounds noted throughout lung fields.  Patient able to speak in clear complete  sentences.  No acute cardiovascular respiratory exam findings.  Differential diagnosis includes COVID, flu, pneumonia,  viral URI with cough.   Labs:  I ordered, and personally interpreted labs.  The pertinent results include:   CBC without leukocytosis. BMP unremarkable. Lactic unremarkable. COVID and flu swab negative. Blood cultures obtained  Imaging: I ordered imaging studies including CXR I independently visualized and interpreted imaging which showed: Pneumonia noted on x-ray I agree with the radiologist interpretation  Medications:  I ordered medication including albuterol inhaler, Zithromax, Rocephin, Decadron, breathing treatment for symptom management Reevaluation of the patient after these medicines and interventions, I reevaluated the patient and found that they have improved I have reviewed the patients home medicines and have made adjustments as needed   Disposition: Presentation suspicious for pneumonia.  Also suspicious for cough.  Doubt COVID, flu at this time.. After consideration of the diagnostic results and the patients response to treatment, I feel that the patient would benefit from Discharge home.  Patient sent home with prednisone, Mucinex, doxycycline, Omnicef.  Supportive care measures and strict return precautions discussed with patient at bedside. Pt acknowledges and verbalizes understanding. Pt appears safe for discharge. Follow up as indicated in discharge paperwork.    This chart was dictated using voice recognition software, Dragon. Despite the best efforts of this provider to proofread and correct errors, errors may still occur which can change documentation meaning.  Final Clinical Impression(s) / ED Diagnoses Final diagnoses:  Viral URI with cough  Pneumonia of right lower lobe due to infectious organism    Rx / DC Orders ED Discharge Orders          Ordered    predniSONE (DELTASONE) 20 MG tablet  Daily        03/04/22 2204     dextromethorphan-guaiFENesin (MUCINEX DM) 30-600 MG 12hr tablet  2 times daily        03/04/22 2204    doxycycline (VIBRAMYCIN) 100 MG capsule  2 times daily        03/04/22 2204    cefdinir (OMNICEF) 300 MG capsule  2 times daily        03/04/22 2204              Carol Loftin A, PA-C 03/05/22 1904    Gareth Morgan, MD 03/05/22 2254

## 2022-03-04 NOTE — Discharge Instructions (Addendum)
It was a pleasure taking care of you!   Your COVID and flu swabs were negative today.  Your chest x-ray showed concern for pneumonia.  This was treated in the emergency department with Rocephin and Zithromax.  You will be sent a prescription for doxycycline and Omnicef, take as prescribed.  You will also be sent a prescription for Mucinex DM as well as prednisone.  Follow-up with your pulmonologist as well as primary care provider regarding today's ED visit.  Ensure to maintain fluid intake with tea, soup, broth, water, Gatorade, Pedialyte.  Return to the emergency department if you are experiencing increasing/worsening symptoms.

## 2022-03-10 LAB — CULTURE, BLOOD (ROUTINE X 2): Culture: NO GROWTH

## 2022-03-16 DIAGNOSIS — J439 Emphysema, unspecified: Secondary | ICD-10-CM | POA: Diagnosis not present

## 2022-03-16 DIAGNOSIS — R0602 Shortness of breath: Secondary | ICD-10-CM | POA: Diagnosis not present

## 2022-03-16 DIAGNOSIS — R051 Acute cough: Secondary | ICD-10-CM | POA: Diagnosis not present

## 2022-03-16 DIAGNOSIS — J189 Pneumonia, unspecified organism: Secondary | ICD-10-CM | POA: Diagnosis not present

## 2022-03-16 DIAGNOSIS — E1040 Type 1 diabetes mellitus with diabetic neuropathy, unspecified: Secondary | ICD-10-CM | POA: Diagnosis not present

## 2022-03-28 DIAGNOSIS — M810 Age-related osteoporosis without current pathological fracture: Secondary | ICD-10-CM | POA: Diagnosis not present

## 2022-04-03 DIAGNOSIS — Z961 Presence of intraocular lens: Secondary | ICD-10-CM | POA: Diagnosis not present

## 2022-04-03 DIAGNOSIS — H30033 Focal chorioretinal inflammation, peripheral, bilateral: Secondary | ICD-10-CM | POA: Diagnosis not present

## 2022-04-03 DIAGNOSIS — E109 Type 1 diabetes mellitus without complications: Secondary | ICD-10-CM | POA: Diagnosis not present

## 2022-04-03 DIAGNOSIS — Z79899 Other long term (current) drug therapy: Secondary | ICD-10-CM | POA: Diagnosis not present

## 2022-04-03 DIAGNOSIS — H3581 Retinal edema: Secondary | ICD-10-CM | POA: Diagnosis not present

## 2022-04-19 ENCOUNTER — Encounter: Payer: Self-pay | Admitting: Adult Health

## 2022-04-19 ENCOUNTER — Ambulatory Visit: Payer: Medicare HMO | Admitting: Adult Health

## 2022-04-19 ENCOUNTER — Ambulatory Visit (INDEPENDENT_AMBULATORY_CARE_PROVIDER_SITE_OTHER): Payer: Medicare HMO

## 2022-04-19 VITALS — BP 130/90 | HR 112 | Temp 98.1°F | Ht 59.0 in | Wt 159.2 lb

## 2022-04-19 DIAGNOSIS — R490 Dysphonia: Secondary | ICD-10-CM

## 2022-04-19 DIAGNOSIS — E1040 Type 1 diabetes mellitus with diabetic neuropathy, unspecified: Secondary | ICD-10-CM | POA: Diagnosis not present

## 2022-04-19 DIAGNOSIS — G4733 Obstructive sleep apnea (adult) (pediatric): Secondary | ICD-10-CM | POA: Diagnosis not present

## 2022-04-19 DIAGNOSIS — J189 Pneumonia, unspecified organism: Secondary | ICD-10-CM

## 2022-04-19 DIAGNOSIS — Z794 Long term (current) use of insulin: Secondary | ICD-10-CM | POA: Diagnosis not present

## 2022-04-19 DIAGNOSIS — J449 Chronic obstructive pulmonary disease, unspecified: Secondary | ICD-10-CM

## 2022-04-19 DIAGNOSIS — I10 Essential (primary) hypertension: Secondary | ICD-10-CM | POA: Diagnosis not present

## 2022-04-19 DIAGNOSIS — Z4681 Encounter for fitting and adjustment of insulin pump: Secondary | ICD-10-CM | POA: Diagnosis not present

## 2022-04-19 DIAGNOSIS — E785 Hyperlipidemia, unspecified: Secondary | ICD-10-CM | POA: Diagnosis not present

## 2022-04-19 NOTE — Assessment & Plan Note (Signed)
Chronic hoarseness.  Patient has had intermittent hoarseness since critical illness in January 2022 that required intubation.  ENT referral is pending

## 2022-04-19 NOTE — Patient Instructions (Signed)
Continue on TRELEGY 1 puff daily, rinse after use.  Flu shot today .  Albuterol inhaler or Neb as needed  Activity as tolerated.  GERD diet  Continue on Prevacid daily  Continue on Xyzal daily.  Refer to ENT for hoarseness. (Dr. Benjamine Mola)  Chest xray today .  Follow up up Neurology next month for sleep apnea.  Follow up with Dr. Valeta Harms 3 months and As needed

## 2022-04-19 NOTE — Assessment & Plan Note (Signed)
History of sleep apnea CPAP intolerant.  Continue follow-up with neurology.  Patient has an appointment next month.

## 2022-04-19 NOTE — Assessment & Plan Note (Signed)
Right greater than left opacities consistent with pneumonia.  Clinically improved after multiple antibiotics.  We will check chest x-ray today. He is immunosuppressed on chronic underlying methotrexate.  Continue to monitor closely  Plan  Patient Instructions  Continue on TRELEGY 1 puff daily, rinse after use.  Flu shot today .  Albuterol inhaler or Neb as needed  Activity as tolerated.  GERD diet  Continue on Prevacid daily  Continue on Xyzal daily.  Refer to ENT for hoarseness. (Dr. Benjamine Mola)  Chest xray today .  Follow up up Neurology next month for sleep apnea.  Follow up with Dr. Valeta Harms 3 months and As needed

## 2022-04-19 NOTE — Assessment & Plan Note (Signed)
Continue follow-up with rheumatology 

## 2022-04-19 NOTE — Progress Notes (Signed)
_0  ID: [name], female    DOB: [birth year], 56 y.o.   MRN: [id number]  Chief Complaint  Patient presents with   Follow-up    Referring provider: Prince Solian, MD  HPI: 56 year old female former smoker followed for COPD with asthma Medical history is significant for GERD, hypertension, hyperlipidemia, gastroparesis, type 1 diabetes, rheumatoid arthritis on methotrexate COVID-19 infection and COVID-pneumonia in January 2022 required hospitalization-complicated by acute respiratory failure requiring vent support, had a long recovery requiring prolonged oxygen use. History of sleep apnea CPAP intolerant followed by neurology  TEST/EVENTS :  PFTs February 02, 2021 showed normal lung function with FEV1 at 83%, ratio 87, FVC 75%, no significant bronchodilator response, DLCO 80%.   Lung cancer CT chest February 08, 2021 showed mild emphysema, left upper lobe nodule measuring 1.9 mm, lung RADS 2   Allergy panel September 07, 2021 negative, IgE 6, eosinophils 200  04/19/2022 Follow up : COPD with Asthma  Patient returns for a 76-monthfollow-up.  Patient has underlying COPD with mild emphysema and asthma.  Critical illness in January 2022 with [id number] complicated to COVID-pneumonia pneumonia and acute respiratory failure requiring prolonged hospitalization with vent support.  Since last visit patient says she is doing okay.  She has had no flare of her cough or wheezing.  She remains on Trelegy inhaler daily. Feels this works for good for her. Requests sample as expensive.  Has previously been on Pulmicort, Breo, Breztri.  Was treated for pneumonia in September with Levaquin, Doxycycline and Cefdinir  and prednisone .  Chest x-ray showed right greater than left airspace opacities.  Patient is feeling better with decreased cough/congestion . Decreased dyspnea and wheezing .  Participates in the lung cancer screening program.  CT chest February 08, 2022 showed benign appearance, new  mucoid and right lower lobe groundglass Had chronic hoarseness with referral to ENT. Did not get appointment . Hoarseness comes and goes. Wants to see Dr. TBenjamine Mola has seen him in past.  Husband passed away earlier this year unexpectedly. Still anxious. Following counselor .  Patient has underlying sleep apnea with CPAP intolerance.  Followed by neurology. Trouble with low blood sugars recently. Going to endocrinology to have insulin pump check today .    Allergies  Allergen Reactions   Mirtazapine Hives and Rash   Clindamycin Hives   Macrobid [Nitrofurantoin] Hives   Other Hives    Tylox    Sulfonamide Derivatives Hives   Alendronate Sodium Itching   Fosamax [Alendronate]     Other reaction(s): HIVES   Lipitor [Atorvastatin] Other (See Comments)    MUSCLE ACHES   Restasis [Cyclosporine]     Pt stated, "My eyes turned red on the inside and outside of eye; burning sensation"   Sulfa Antibiotics Hives   Topamax [Topiramate] Itching    Immunization History  Administered Date(s) Administered   Influenza Split 03/25/2002, 03/07/2011, 06/15/2011, 04/11/2012, 02/25/2013, 06/30/2013, 03/17/2014   Influenza, Quadrivalent, Recombinant, Inj, Pf 02/18/2018, 02/26/2019, 04/08/2020, 04/25/2021   Influenza-Unspecified 03/06/2017   Moderna Sars-Covid-2 Vaccination 05/03/2020, 05/19/2020, 01/21/2021   Pneumococcal Polysaccharide-23 03/25/2002, 02/25/2013, 02/16/2021   Tdap 06/17/2012   Zoster, Live 06/30/2013    Past Medical History:  Diagnosis Date   Anxiety    COPD (chronic obstructive pulmonary disease) (HCC)    Depression    DOE (dyspnea on exertion)    Dyspnea    Gastroparesis    GERD (gastroesophageal reflux disease)    Glaucoma, both eyes    History of 2019 novel  coronavirus disease (COVID-19) 06/20/2020   hospital admission-- dx severe covid with pneumonia, ARDS, DKA, COPD exaberation;  pt intubated 01-13th and extubated 01-20th;   (11-15-2020  per pt did not go home on oxygen,  residual generalized weakness and hair loss)   History of esophageal stricture    post dilatation, followed by dr Henrene Pastor   History of Graves' disease    s/p RAI  [id number]   History of Helicobacter pylori infection    remote hx and treatment   History of kidney stones    Hyperlipidemia    Hypertension    followed by pcp   Hypothyroidism, postradioiodine therapy 02/2010   followed by pcp   IDA (iron deficiency anemia)    Insulin pump in place    w/ Novolog , managed by pcp   Moderate asthma    followed by pcp,  (11-15-2020  per pt has pulmonology appt on 11-17-2020))   OSA (obstructive sleep apnea)    11-15-2020  per pt has not used cpap since 05/ 2021  (previous followed by dr Brett Fairy,  study in epic 05-12-2018 moderate complex osa pt had used for awhile)   Osteoporosis    PAD (peripheral artery disease) ([place])    followed by dr berry----  s/p bilateral common iliac artery angioplasty stenting for stenosis   Peripheral neuropathy    Pneumonia    covid 2022   PONV (postoperative nausea and vomiting)    Rheumatoid arthritis ([place])    in both eyes   Right wrist fracture    Type 1 diabetes mellitus with long-term current use of insulin ([place])    per pt dx at age 45 approx,  followed by pcp,  (11-15-2020  per pt last A1c 7.2 in 04/ 2022),  checks blood sugar 6 to 10 times daily , fasting sugar-- 200 -- 300)   Vitamin D deficiency     Tobacco History: Social History   Tobacco Use  Smoking Status Former   Packs/day: 0.50   Years: 25.00   Total pack years: 12.50   Types: Cigarettes   Start date: 79   Quit date: 06/20/2020   Years since quitting: 1.8  Smokeless Tobacco Never  Tobacco Comments   Started smoking at age 8-16   Counseling given: Not Answered Tobacco comments: Started smoking at age [age]   Outpatient Medications Prior to Visit  Medication Sig Dispense Refill   ACCU-CHEK COMPACT PLUS test strip 1 each by Other route as needed.      ACCU-CHEK SOFTCLIX  LANCETS lancets      acetaminophen (TYLENOL) 500 MG tablet Take 1,000 mg by mouth every 6 (six) hours as needed for moderate pain.     albuterol (PROVENTIL) (2.5 MG/3ML) 0.083% nebulizer solution Take 3 mLs (2.5 mg total) by nebulization every 4 (four) hours as needed for wheezing or shortness of breath. 75 mL 3   Albuterol Sulfate, sensor, (PROAIR DIGIHALER) 108 (90 Base) MCG/ACT AEPB Inhale 2 puffs into the lungs every 6 (six) hours as needed (shortness of breath).     ALPRAZolam (XANAX) 0.5 MG tablet Take 0.5-1 mg by mouth 3 (three) times daily as needed for anxiety.     AMBULATORY NON FORMULARY MEDICATION NOVOLOG INSULIN PUMP USES AS DIRECTED     aspirin EC 81 MG tablet Take 81 mg by mouth daily. Swallow whole.     Blood Glucose Monitoring Suppl (ACCU-CHEK AVIVA PLUS) w/Device KIT      clopidogrel (PLAVIX) 75 MG tablet TAKE 1 TABLET EVERY DAY  WITH BREAKFAST 90 tablet 3   estradiol (ESTRACE) 1 MG tablet Take 1 mg by mouth at bedtime.     Fluticasone-Umeclidin-Vilant (TRELEGY ELLIPTA) 200-62.5-25 MCG/ACT AEPB Inhale 1 puff into the lungs daily. 28 each 0   folic acid (FOLVITE) 1 MG tablet Take 1 tablet by mouth daily.     Insulin Human (INSULIN PUMP) SOLN Inject into the skin. Novolog----  basal rate--- 12 am - 2 am = 1.3, 2 am - 6 am = 1.4, 6 am - 3 pm = 1.8, 3 pm - 12 am = 1.55     lansoprazole (PREVACID) 30 MG capsule Take 1 capsule (30 mg total) by mouth 2 (two) times daily before a meal. 180 capsule 1   latanoprost (XALATAN) 0.005 % ophthalmic solution Place 1 drop into both eyes at bedtime.  11   levocetirizine (XYZAL) 5 MG tablet Take 5 mg by mouth every morning.      levothyroxine (SYNTHROID) 125 MCG tablet Take 125 mcg by mouth daily before breakfast.     methotrexate (RHEUMATREX) 2.5 MG tablet Take 6 tablets (15 mg total) by mouth once a week. 3 tablets am and at bedtime twice a day once a week,  Saturday's (Patient taking differently: Take 25 mg by mouth once a week. 5 tablets am and  at bedtime twice a day once a week,  Saturday's)     metoprolol succinate (TOPROL-XL) 50 MG 24 hr tablet Take 50 mg by mouth daily. Take with or immediately following a meal.     montelukast (SINGULAIR) 10 MG tablet Take 10 mg by mouth at bedtime.     NOVOLOG 100 UNIT/ML injection Inject into the skin See admin instructions. Pt has Insulin pump     pregabalin (LYRICA) 75 MG capsule Take 75-150 mg by mouth See admin instructions. Take 75 mg in the morning, 75 mg at lunch, and 150 mg at bedtime     rosuvastatin (CRESTOR) 40 MG tablet Take 40 mg by mouth daily.     Sertraline HCl 200 MG CAPS Take 200 mg by mouth daily.     Vitamin D, Ergocalciferol, (DRISDOL) 50000 UNITS CAPS Take 50,000 Units by mouth every Saturday.     budesonide (PULMICORT) 0.25 MG/2ML nebulizer solution Take 2 mLs (0.25 mg total) by nebulization 2 (two) times daily as needed (wheezing/dyspnea). 60 mL 1   Biotin w/ Vitamins C & E (HAIR/SKIN/NAILS PO) Take 1 capsule by mouth at bedtime.     doxycycline (VIBRAMYCIN) 100 MG capsule Take 1 capsule (100 mg total) by mouth 2 (two) times daily. 20 capsule 0   Fluticasone-Umeclidin-Vilant (TRELEGY ELLIPTA) 100-62.5-25 MCG/ACT AEPB Inhale 1 each into the lungs daily. 1 each 0   Oyster Shell Calcium 500 MG TABS 1 tablet with meals     rosuvastatin (CRESTOR) 20 MG tablet Take 20 mg by mouth at bedtime. (Patient not taking: Reported on 04/19/2022)     No facility-administered medications prior to visit.     Review of Systems:   Constitutional:   No  weight loss, night sweats,  Fevers, chills,  +fatigue, or  lassitude.  HEENT:   No headaches,  Difficulty swallowing,  Tooth/dental problems, or  Sore throat,                No sneezing, itching, ear ache, nasal congestion, post nasal drip,   CV:  No chest pain,  Orthopnea, PND, swelling in lower extremities, anasarca, dizziness, palpitations, syncope.   GI  No heartburn, indigestion, abdominal pain,  nausea, vomiting, diarrhea, change in  bowel habits, loss of appetite, bloody stools.   Resp:   No chest wall deformity  Skin: no rash or lesions.  GU: no dysuria, change in color of urine, no urgency or frequency.  No flank pain, no hematuria   MS:  No joint pain or swelling.  No decreased range of motion.  No back pain.    Physical Exam  BP (!) 130/90 (BP Location: Left Arm, Patient Position: Sitting, Cuff Size: Normal)   Pulse (!) 112   Temp 98.1 F (36.7 C) (Oral)   Ht 4' 11" (1.499 m)   Wt 159 lb 3.2 oz (72.2 kg)   SpO2 97%   BMI 32.15 kg/m   GEN: A/Ox3; pleasant , NAD, well nourished    HEENT:  [place]/AT,  NOSE-clear, THROAT-clear, no lesions, no postnasal drip or exudate noted.   NECK:  Supple w/ fair ROM; no JVD; normal carotid impulses w/o bruits; no thyromegaly or nodules palpated; no lymphadenopathy.    RESP  Clear  P & A; w/o, wheezes/ rales/ or rhonchi. no accessory muscle use, no dullness to percussion  CARD:  RRR, no m/r/g, no peripheral edema, pulses intact, no cyanosis or clubbing.  GI:   Soft & nt; nml bowel sounds; no organomegaly or masses detected.   Musco: Warm bil, no deformities or joint swelling noted.   Neuro: alert, no focal deficits noted.    Skin: Warm, no lesions or rashes    Lab Results:    BMET   ProBNP No results found for: "PROBNP"  Imaging: No results found.       Latest Ref Rng & Units 02/02/2021   12:45 PM  PFT Results  FVC-Pre L 2.25   FVC-Predicted Pre % 76   FVC-Post L 2.22   FVC-Predicted Post % 75   Pre FEV1/FVC % % 85   Post FEV1/FCV % % 87   FEV1-Pre L 1.91   FEV1-Predicted Pre % 82   FEV1-Post L 1.94   DLCO uncorrected ml/min/mmHg 14.42   DLCO UNC% % 80   DLCO corrected ml/min/mmHg 14.84   DLCO COR %Predicted % 82   DLVA Predicted % 94   TLC L 3.89   TLC % Predicted % 87   RV % Predicted % 81     Lab Results  Component Value Date   NITRICOXIDE 15 04/26/2015        Assessment & Plan:   COPD (chronic obstructive pulmonary  disease) ([place]) COPD/asthma overlap patient seems to be doing well on Trelegy inhaler.  Advised to rinse well after use.  Continue on trigger prevention with GERD and postnasal drainage treatment regimen  Plan  Patient Instructions  Continue on TRELEGY 1 puff daily, rinse after use.  Flu shot today .  Albuterol inhaler or Neb as needed  Activity as tolerated.  GERD diet  Continue on Prevacid daily  Continue on Xyzal daily.  Refer to ENT for hoarseness. (Dr. Benjamine Mola)  Chest xray today .  Follow up up Neurology next month for sleep apnea.  Follow up with Dr. Valeta Harms 3 months and As needed       OSA on CPAP History of sleep apnea CPAP intolerant.  Continue follow-up with neurology.  Patient has an appointment next month.  Rheumatoid arthritis ([place]) Continue follow-up with rheumatology  CAP (community acquired pneumonia) Right greater than left opacities consistent with pneumonia.  Clinically improved after multiple antibiotics.  We will check chest x-ray today. He is immunosuppressed  on chronic underlying methotrexate.  Continue to monitor closely  Plan  Patient Instructions  Continue on TRELEGY 1 puff daily, rinse after use.  Flu shot today .  Albuterol inhaler or Neb as needed  Activity as tolerated.  GERD diet  Continue on Prevacid daily  Continue on Xyzal daily.  Refer to ENT for hoarseness. (Dr. Benjamine Mola)  Chest xray today .  Follow up up Neurology next month for sleep apnea.  Follow up with Dr. Valeta Harms 3 months and As needed       Hoarseness of voice Chronic hoarseness.  Patient has had intermittent hoarseness since critical illness in January 2022 that required intubation.  ENT referral is pending     Rexene Edison, NP 04/19/2022

## 2022-04-19 NOTE — Assessment & Plan Note (Signed)
COPD/asthma overlap patient seems to be doing well on Trelegy inhaler.  Advised to rinse well after use.  Continue on trigger prevention with GERD and postnasal drainage treatment regimen  Plan  Patient Instructions  Continue on TRELEGY 1 puff daily, rinse after use.  Flu shot today .  Albuterol inhaler or Neb as needed  Activity as tolerated.  GERD diet  Continue on Prevacid daily  Continue on Xyzal daily.  Refer to ENT for hoarseness. (Dr. Benjamine Mola)  Chest xray today .  Follow up up Neurology next month for sleep apnea.  Follow up with Dr. Valeta Harms 3 months and As needed

## 2022-04-25 DIAGNOSIS — K1329 Other disturbances of oral epithelium, including tongue: Secondary | ICD-10-CM | POA: Diagnosis not present

## 2022-05-01 DIAGNOSIS — E785 Hyperlipidemia, unspecified: Secondary | ICD-10-CM | POA: Diagnosis not present

## 2022-05-01 DIAGNOSIS — Z4681 Encounter for fitting and adjustment of insulin pump: Secondary | ICD-10-CM | POA: Diagnosis not present

## 2022-05-01 DIAGNOSIS — Z794 Long term (current) use of insulin: Secondary | ICD-10-CM | POA: Diagnosis not present

## 2022-05-01 DIAGNOSIS — I10 Essential (primary) hypertension: Secondary | ICD-10-CM | POA: Diagnosis not present

## 2022-05-01 DIAGNOSIS — E1040 Type 1 diabetes mellitus with diabetic neuropathy, unspecified: Secondary | ICD-10-CM | POA: Diagnosis not present

## 2022-05-11 DIAGNOSIS — E1040 Type 1 diabetes mellitus with diabetic neuropathy, unspecified: Secondary | ICD-10-CM | POA: Diagnosis not present

## 2022-05-11 DIAGNOSIS — I10 Essential (primary) hypertension: Secondary | ICD-10-CM | POA: Diagnosis not present

## 2022-05-11 DIAGNOSIS — E785 Hyperlipidemia, unspecified: Secondary | ICD-10-CM | POA: Diagnosis not present

## 2022-05-17 ENCOUNTER — Ambulatory Visit: Payer: Medicare HMO | Admitting: Family Medicine

## 2022-06-23 ENCOUNTER — Other Ambulatory Visit: Payer: Self-pay | Admitting: Cardiovascular Disease

## 2022-06-27 ENCOUNTER — Encounter: Payer: Self-pay | Admitting: Internal Medicine

## 2022-07-02 DIAGNOSIS — K1329 Other disturbances of oral epithelium, including tongue: Secondary | ICD-10-CM | POA: Diagnosis not present

## 2022-07-02 DIAGNOSIS — K121 Other forms of stomatitis: Secondary | ICD-10-CM | POA: Diagnosis not present

## 2022-07-09 DIAGNOSIS — E1040 Type 1 diabetes mellitus with diabetic neuropathy, unspecified: Secondary | ICD-10-CM | POA: Diagnosis not present

## 2022-07-13 ENCOUNTER — Ambulatory Visit: Payer: Medicare HMO | Admitting: Adult Health

## 2022-07-19 DIAGNOSIS — E10649 Type 1 diabetes mellitus with hypoglycemia without coma: Secondary | ICD-10-CM | POA: Diagnosis not present

## 2022-07-20 DIAGNOSIS — Z794 Long term (current) use of insulin: Secondary | ICD-10-CM | POA: Diagnosis not present

## 2022-07-20 DIAGNOSIS — E1040 Type 1 diabetes mellitus with diabetic neuropathy, unspecified: Secondary | ICD-10-CM | POA: Diagnosis not present

## 2022-07-20 DIAGNOSIS — I10 Essential (primary) hypertension: Secondary | ICD-10-CM | POA: Diagnosis not present

## 2022-07-20 DIAGNOSIS — E785 Hyperlipidemia, unspecified: Secondary | ICD-10-CM | POA: Diagnosis not present

## 2022-07-20 DIAGNOSIS — Z4681 Encounter for fitting and adjustment of insulin pump: Secondary | ICD-10-CM | POA: Diagnosis not present

## 2022-07-30 DIAGNOSIS — G4733 Obstructive sleep apnea (adult) (pediatric): Secondary | ICD-10-CM | POA: Diagnosis not present

## 2022-07-30 DIAGNOSIS — F418 Other specified anxiety disorders: Secondary | ICD-10-CM | POA: Diagnosis not present

## 2022-07-30 DIAGNOSIS — H15003 Unspecified scleritis, bilateral: Secondary | ICD-10-CM | POA: Diagnosis not present

## 2022-07-30 DIAGNOSIS — K3184 Gastroparesis: Secondary | ICD-10-CM | POA: Diagnosis not present

## 2022-07-30 DIAGNOSIS — J439 Emphysema, unspecified: Secondary | ICD-10-CM | POA: Diagnosis not present

## 2022-07-30 DIAGNOSIS — I10 Essential (primary) hypertension: Secondary | ICD-10-CM | POA: Diagnosis not present

## 2022-07-30 DIAGNOSIS — I739 Peripheral vascular disease, unspecified: Secondary | ICD-10-CM | POA: Diagnosis not present

## 2022-07-30 DIAGNOSIS — E1040 Type 1 diabetes mellitus with diabetic neuropathy, unspecified: Secondary | ICD-10-CM | POA: Diagnosis not present

## 2022-08-09 DIAGNOSIS — E1040 Type 1 diabetes mellitus with diabetic neuropathy, unspecified: Secondary | ICD-10-CM | POA: Diagnosis not present

## 2022-08-09 DIAGNOSIS — E039 Hypothyroidism, unspecified: Secondary | ICD-10-CM | POA: Diagnosis not present

## 2022-08-09 DIAGNOSIS — I10 Essential (primary) hypertension: Secondary | ICD-10-CM | POA: Diagnosis not present

## 2022-08-09 DIAGNOSIS — H30033 Focal chorioretinal inflammation, peripheral, bilateral: Secondary | ICD-10-CM | POA: Diagnosis not present

## 2022-08-09 DIAGNOSIS — Z79899 Other long term (current) drug therapy: Secondary | ICD-10-CM | POA: Diagnosis not present

## 2022-08-09 DIAGNOSIS — E1065 Type 1 diabetes mellitus with hyperglycemia: Secondary | ICD-10-CM | POA: Diagnosis not present

## 2022-08-09 DIAGNOSIS — M810 Age-related osteoporosis without current pathological fracture: Secondary | ICD-10-CM | POA: Diagnosis not present

## 2022-08-28 ENCOUNTER — Ambulatory Visit: Payer: Medicare HMO | Admitting: Adult Health

## 2022-08-29 DIAGNOSIS — R300 Dysuria: Secondary | ICD-10-CM | POA: Diagnosis not present

## 2022-08-29 DIAGNOSIS — Z6829 Body mass index (BMI) 29.0-29.9, adult: Secondary | ICD-10-CM | POA: Diagnosis not present

## 2022-08-29 DIAGNOSIS — N958 Other specified menopausal and perimenopausal disorders: Secondary | ICD-10-CM | POA: Diagnosis not present

## 2022-08-29 DIAGNOSIS — Z1231 Encounter for screening mammogram for malignant neoplasm of breast: Secondary | ICD-10-CM | POA: Diagnosis not present

## 2022-08-29 DIAGNOSIS — E039 Hypothyroidism, unspecified: Secondary | ICD-10-CM | POA: Diagnosis not present

## 2022-08-29 DIAGNOSIS — N390 Urinary tract infection, site not specified: Secondary | ICD-10-CM | POA: Diagnosis not present

## 2022-08-29 DIAGNOSIS — Z01419 Encounter for gynecological examination (general) (routine) without abnormal findings: Secondary | ICD-10-CM | POA: Diagnosis not present

## 2022-08-29 DIAGNOSIS — Z8262 Family history of osteoporosis: Secondary | ICD-10-CM | POA: Diagnosis not present

## 2022-08-29 DIAGNOSIS — R319 Hematuria, unspecified: Secondary | ICD-10-CM | POA: Diagnosis not present

## 2022-08-29 DIAGNOSIS — M816 Localized osteoporosis [Lequesne]: Secondary | ICD-10-CM | POA: Diagnosis not present

## 2022-09-05 DIAGNOSIS — A498 Other bacterial infections of unspecified site: Secondary | ICD-10-CM | POA: Diagnosis not present

## 2022-09-05 DIAGNOSIS — N390 Urinary tract infection, site not specified: Secondary | ICD-10-CM | POA: Diagnosis not present

## 2022-09-07 DIAGNOSIS — E109 Type 1 diabetes mellitus without complications: Secondary | ICD-10-CM | POA: Diagnosis not present

## 2022-09-07 DIAGNOSIS — E1040 Type 1 diabetes mellitus with diabetic neuropathy, unspecified: Secondary | ICD-10-CM | POA: Diagnosis not present

## 2022-09-07 DIAGNOSIS — Z79899 Other long term (current) drug therapy: Secondary | ICD-10-CM | POA: Diagnosis not present

## 2022-09-07 DIAGNOSIS — Z961 Presence of intraocular lens: Secondary | ICD-10-CM | POA: Diagnosis not present

## 2022-09-07 DIAGNOSIS — H30033 Focal chorioretinal inflammation, peripheral, bilateral: Secondary | ICD-10-CM | POA: Diagnosis not present

## 2022-09-07 DIAGNOSIS — H3581 Retinal edema: Secondary | ICD-10-CM | POA: Diagnosis not present

## 2022-09-10 ENCOUNTER — Encounter: Payer: Self-pay | Admitting: Adult Health

## 2022-09-10 ENCOUNTER — Ambulatory Visit: Payer: Medicare HMO | Admitting: Adult Health

## 2022-09-10 VITALS — BP 106/50 | HR 92 | Temp 98.5°F | Ht 59.0 in | Wt 150.4 lb

## 2022-09-10 DIAGNOSIS — J449 Chronic obstructive pulmonary disease, unspecified: Secondary | ICD-10-CM | POA: Diagnosis not present

## 2022-09-10 DIAGNOSIS — R490 Dysphonia: Secondary | ICD-10-CM | POA: Diagnosis not present

## 2022-09-10 DIAGNOSIS — K219 Gastro-esophageal reflux disease without esophagitis: Secondary | ICD-10-CM

## 2022-09-10 MED ORDER — TRELEGY ELLIPTA 100-62.5-25 MCG/ACT IN AEPB: 1.0000 | INHALATION_SPRAY | Freq: Every day | RESPIRATORY_TRACT | 0 refills | Status: DC

## 2022-09-10 NOTE — Patient Instructions (Addendum)
Continue on TRELEGY 1 puff daily, rinse after use.  Albuterol inhaler or Neb as needed  Activity as tolerated.  GERD diet  Continue on Prevacid daily  Continue on Xyzal daily.  Follow up with Dr. Valeta Harms 6 months with Spirometry w/ DLCO and As needed

## 2022-09-10 NOTE — Assessment & Plan Note (Signed)
COPD with mild emphysema and asthma currently stable.  Continue on Trelegy daily.  Check spirometry and DLCO on return visit.  Plan  Patient Instructions  Continue on TRELEGY 1 puff daily, rinse after use.  Albuterol inhaler or Neb as needed  Activity as tolerated.  GERD diet  Continue on Prevacid daily  Continue on Xyzal daily.  Follow up with Dr. Valeta Harms 6 months with Spirometry w/ DLCO and As needed

## 2022-09-10 NOTE — Progress Notes (Signed)
@Patient  ID: Ashley [name], female    DOB: 11-21-1965, 57 y.o.   MRN: [id number]  Chief Complaint  Patient presents with   Follow-up    Referring provider: Prince Solian, MD  HPI: 57 year old female former smoker followed for COPD with asthma Medical history significant for GERD, hypertension, hyperlipidemia, gastroparesis, type 1 diabetes, rheumatoid arthritis on methotrexate COVID-19 infection and COVID-pneumonia in January 2022 required hospitalization-complicated by acute respiratory failure requiring vent support, had a long recovery requiring prolonged oxygen use. History of sleep apnea CPAP intolerant followed by neurology  TEST/EVENTS :  PFTs February 02, 2021 showed normal lung function with FEV1 at 83%, ratio 87, FVC 75%, no significant bronchodilator response, DLCO 80%.   Lung cancer CT chest February 08, 2021 showed mild emphysema, left upper lobe nodule measuring 1.9 mm, lung RADS 2   Allergy panel September 07, 2021 negative, IgE 6, eosinophils 200  09/10/2022 Follow up: COPD with Asthma  Patient returns for 51-month follow-up.  Patient has underlying COPD with mild emphysema and asthma, history of critical illness in January 2022 with [id number] infection complicated by COVID-pneumonia, acute respiratory failure with prolonged hospitalization with ventilator support.  She says overall she is doing okay.  She gets short of breath with heavy activities.  No flare of her cough or wheezing.  She remains on Trelegy inhaler daily-says she does not use everyday.  She says her insurance is charging her quite a bit.  She is going to check with them to see if she has any other options on her formulary. She participates in the lung cancer screening program.  CT chest February 08, 2022 showed benign appearance , new mucoid and right lower lobe groundglass.  She was treated for possible acute infection.  Chest x-ray in November 2023 showed clear lungs.  Last PFTs were in August 2022 that showed  no significant airflow obstruction. Last visit had requested a referral to ENT for chronic hoarseness. Referral was sent, did not hear from them. Says the hoarseness went away . Following with oral surgeon for tongue lesion , she underwent biopsy x 2 that was benign.  Patient's husband passed away last year unexpectedly.  Has been seen by counselor. Has history of sleep apnea has been CPAP intolerant.  Follows with neurology.       Allergies  Allergen Reactions   Mirtazapine Hives and Rash   Clindamycin Hives   Macrobid [Nitrofurantoin] Hives   Other Hives    Tylox    Sulfonamide Derivatives Hives   Alendronate Sodium Itching   Fosamax [Alendronate]     Other reaction(s): HIVES   Lipitor [Atorvastatin] Other (See Comments)    MUSCLE ACHES   Restasis [Cyclosporine]     Pt stated, "My eyes turned red on the inside and outside of eye; burning sensation"   Sulfa Antibiotics Hives   Topamax [Topiramate] Itching    Immunization History  Administered Date(s) Administered   Influenza Split 03/25/2002, 03/07/2011, 06/15/2011, 04/11/2012, 02/25/2013, 06/30/2013, 03/17/2014   Influenza, Quadrivalent, Recombinant, Inj, Pf 02/18/2018, 02/26/2019, 04/08/2020, 04/25/2021   Influenza-Unspecified 03/06/2017, 05/01/2022   Moderna Sars-Covid-2 Vaccination 05/03/2020, 05/19/2020, 01/21/2021   Pneumococcal Polysaccharide-23 03/25/2002, 02/25/2013, 02/16/2021   Tdap 06/17/2012   Unspecified SARS-COV-2 Vaccination 05/19/2020, 11/21/2020   Zoster, Live 06/30/2013    Past Medical History:  Diagnosis Date   Anxiety    COPD (chronic obstructive pulmonary disease)    Depression    DOE (dyspnea on exertion)    Dyspnea    Gastroparesis  GERD (gastroesophageal reflux disease)    Glaucoma, both eyes    History of 2019 novel coronavirus disease (COVID-19) 06/20/2020   hospital admission-- dx severe covid with pneumonia, ARDS, DKA, COPD exaberation;  pt intubated 01-13th and extubated 01-20th;    (11-15-2020  per pt did not go home on oxygen, residual generalized weakness and hair loss)   History of esophageal stricture    post dilatation, followed by dr Henrene Pastor   History of Graves' disease    s/p RAI  [id number]   History of Helicobacter pylori infection    remote hx and treatment   History of kidney stones    Hyperlipidemia    Hypertension    followed by pcp   Hypothyroidism, postradioiodine therapy 02/2010   followed by pcp   IDA (iron deficiency anemia)    Insulin pump in place    w/ Novolog , managed by pcp   Moderate asthma    followed by pcp,  (11-15-2020  per pt has pulmonology appt on 11-17-2020))   OSA (obstructive sleep apnea)    11-15-2020  per pt has not used cpap since 05/ 2021  (previous followed by dr Brett Fairy,  study in epic 05-12-2018 moderate complex osa pt had used for awhile)   Osteoporosis    PAD (peripheral artery disease)    followed by dr berry----  s/p bilateral common iliac artery angioplasty stenting for stenosis   Peripheral neuropathy    Pneumonia    covid 2022   PONV (postoperative nausea and vomiting)    Rheumatoid arthritis    in both eyes   Right wrist fracture    Type 1 diabetes mellitus with long-term current use of insulin    per pt dx at age 16 approx,  followed by pcp,  (11-15-2020  per pt last A1c 7.2 in 04/ 2022),  checks blood sugar 6 to 10 times daily , fasting sugar-- 200 -- 300)   Vitamin D deficiency     Tobacco History: Social History   Tobacco Use  Smoking Status Former   Packs/day: 0.50   Years: 25.00   Additional pack years: 0.00   Total pack years: 12.50   Types: Cigarettes   Start date: 39   Quit date: 06/20/2020   Years since quitting: 2.2  Smokeless Tobacco Never  Tobacco Comments   Started smoking at age [age]   Counseling given: Not Answered Tobacco comments: Started smoking at age [age]   Outpatient Medications Prior to Visit  Medication Sig Dispense Refill   ACCU-CHEK COMPACT PLUS test strip 1  each by Other route as needed.      ACCU-CHEK SOFTCLIX LANCETS lancets      acetaminophen (TYLENOL) 500 MG tablet Take 1,000 mg by mouth every 6 (six) hours as needed for moderate pain.     albuterol (PROVENTIL) (2.5 MG/3ML) 0.083% nebulizer solution Take 3 mLs (2.5 mg total) by nebulization every 4 (four) hours as needed for wheezing or shortness of breath. 75 mL 3   Albuterol Sulfate, sensor, (PROAIR DIGIHALER) 108 (90 Base) MCG/ACT AEPB Inhale 2 puffs into the lungs every 6 (six) hours as needed (shortness of breath).     ALPRAZolam (XANAX) 0.5 MG tablet Take 0.5-1 mg by mouth 3 (three) times daily as needed for anxiety.     AMBULATORY NON FORMULARY MEDICATION NOVOLOG INSULIN PUMP USES AS DIRECTED     aspirin EC 81 MG tablet Take 81 mg by mouth daily. Swallow whole.     Blood Glucose Monitoring Suppl (  ACCU-CHEK AVIVA PLUS) w/Device KIT      clopidogrel (PLAVIX) 75 MG tablet TAKE 1 TABLET EVERY DAY WITH BREAKFAST 90 tablet 3   Fluticasone-Umeclidin-Vilant (TRELEGY ELLIPTA) 200-62.5-25 MCG/ACT AEPB Inhale 1 puff into the lungs daily. 28 each 0   folic acid (FOLVITE) 1 MG tablet Take 1 tablet by mouth daily.     Insulin Human (INSULIN PUMP) SOLN Inject into the skin. Novolog----  basal rate--- 12 am - 2 am = 1.3, 2 am - 6 am = 1.4, 6 am - 3 pm = 1.8, 3 pm - 12 am = 1.55     lansoprazole (PREVACID) 30 MG capsule Take 1 capsule (30 mg total) by mouth 2 (two) times daily before a meal. 180 capsule 1   latanoprost (XALATAN) 0.005 % ophthalmic solution Place 1 drop into both eyes at bedtime.  11   levocetirizine (XYZAL) 5 MG tablet Take 5 mg by mouth every morning.      levothyroxine (SYNTHROID) 125 MCG tablet Take 125 mcg by mouth daily before breakfast.     methotrexate (RHEUMATREX) 2.5 MG tablet Take 6 tablets (15 mg total) by mouth once a week. 3 tablets am and at bedtime twice a day once a week,  Saturday's (Patient taking differently: Take 25 mg by mouth once a week. 5 tablets am and at bedtime  twice a day once a week,  Saturday's)     metoprolol succinate (TOPROL-XL) 50 MG 24 hr tablet Take 50 mg by mouth daily. Take with or immediately following a meal.     montelukast (SINGULAIR) 10 MG tablet Take 10 mg by mouth at bedtime.     NOVOLOG 100 UNIT/ML injection Inject into the skin See admin instructions. Pt has Insulin pump     pregabalin (LYRICA) 75 MG capsule Take 75-150 mg by mouth See admin instructions. Take 75 mg in the morning, 75 mg at lunch, and 150 mg at bedtime     rosuvastatin (CRESTOR) 40 MG tablet Take 40 mg by mouth daily.     Sertraline HCl 200 MG CAPS Take 200 mg by mouth daily.     Vitamin D, Ergocalciferol, (DRISDOL) 50000 UNITS CAPS Take 50,000 Units by mouth every Saturday.     budesonide (PULMICORT) 0.25 MG/2ML nebulizer solution Take 2 mLs (0.25 mg total) by nebulization 2 (two) times daily as needed (wheezing/dyspnea). 60 mL 1   estradiol (ESTRACE) 1 MG tablet Take 1 mg by mouth at bedtime.     No facility-administered medications prior to visit.     Review of Systems:   Constitutional:   No  weight loss, night sweats,  Fevers, chills,  +fatigue, or  lassitude.  HEENT:   No headaches,  Difficulty swallowing,  Tooth/dental problems, or  Sore throat,                No sneezing, itching, ear ache, nasal congestion, post nasal drip,   CV:  No chest pain,  Orthopnea, PND, swelling in lower extremities, anasarca, dizziness, palpitations, syncope.   GI  No heartburn, indigestion, abdominal pain, nausea, vomiting, diarrhea, change in bowel habits, loss of appetite, bloody stools.   Resp: No chest wall deformity  Skin: no rash or lesions.  GU: no dysuria, change in color of urine, no urgency or frequency.  No flank pain, no hematuria   MS:  No joint pain or swelling.  No decreased range of motion.  No back pain.    Physical Exam  BP (!) 106/50 (BP Location: Left  Arm, Patient Position: Sitting, Cuff Size: Large)   Pulse 92   Temp 98.5 F (36.9 C)  (Oral)   Ht 4\' 11"  (1.499 m)   Wt 150 lb 6.4 oz (68.2 kg)   SpO2 94%   BMI 30.38 kg/m   GEN: A/Ox3; pleasant , NAD, well nourished    HEENT:  [place]/AT, NOSE-clear, THROAT-clear, no lesions, no postnasal drip or exudate noted.   NECK:  Supple w/ fair ROM; no JVD; normal carotid impulses w/o bruits; no thyromegaly or nodules palpated; no lymphadenopathy.    RESP  Clear  P & A; w/o, wheezes/ rales/ or rhonchi. no accessory muscle use, no dullness to percussion  CARD:  RRR, no m/r/g, no peripheral edema, pulses intact, no cyanosis or clubbing.  GI:   Soft & nt; nml bowel sounds; no organomegaly or masses detected.   Musco: Warm bil, no deformities or joint swelling noted.   Neuro: alert, no focal deficits noted.    Skin: Warm, no lesions or rashes    Lab Results:  CBC    Component Value Date/Time   WBC 10.4 03/04/2022 2047   RBC 4.37 03/04/2022 2047   HGB 12.5 03/04/2022 2047   HGB 11.6 01/09/2019 1204   HCT 38.7 03/04/2022 2047   HCT 36.2 01/09/2019 1204   PLT 229 03/04/2022 2047   PLT 388 01/09/2019 1204   MCV 88.6 03/04/2022 2047   MCV 82 01/09/2019 1204   MCH 28.6 03/04/2022 2047   MCHC 32.3 03/04/2022 2047   RDW 16.2 (H) 03/04/2022 2047   RDW 13.5 01/09/2019 1204   LYMPHSABS 1.3 03/04/2022 2047   MONOABS 0.7 03/04/2022 2047   EOSABS 0.1 03/04/2022 2047   BASOSABS 0.0 03/04/2022 2047    BMET    Component Value Date/Time   NA 137 03/04/2022 2047   NA 137 10/04/2021 1458   K 3.9 03/04/2022 2047   CL 100 03/04/2022 2047   CO2 27 03/04/2022 2047   GLUCOSE 177 (H) 03/04/2022 2047   BUN 11 03/04/2022 2047   BUN 14 10/04/2021 1458   CREATININE 0.99 03/04/2022 2047   CALCIUM 9.6 03/04/2022 2047   GFRNONAA >60 03/04/2022 2047   GFRAA 93 01/30/2019 1558    BNP    Component Value Date/Time   BNP 128.8 (H) 06/21/2020 0046    ProBNP No results found for: "PROBNP"  Imaging: No results found.       Latest Ref Rng & Units 02/02/2021   12:45 PM  PFT  Results  FVC-Pre L 2.25   FVC-Predicted Pre % 76   FVC-Post L 2.22   FVC-Predicted Post % 75   Pre FEV1/FVC % % 85   Post FEV1/FCV % % 87   FEV1-Pre L 1.91   FEV1-Predicted Pre % 82   FEV1-Post L 1.94   DLCO uncorrected ml/min/mmHg 14.42   DLCO UNC% % 80   DLCO corrected ml/min/mmHg 14.84   DLCO COR %Predicted % 82   DLVA Predicted % 94   TLC L 3.89   TLC % Predicted % 87   RV % Predicted % 81     Lab Results  Component Value Date   NITRICOXIDE 15 04/26/2015        Assessment & Plan:   COPD (chronic obstructive pulmonary disease) (HCC) COPD with mild emphysema and asthma currently stable.  Continue on Trelegy daily.  Check spirometry and DLCO on return visit.  Plan  Patient Instructions  Continue on TRELEGY 1 puff daily, rinse after use.  Albuterol inhaler or Neb as needed  Activity as tolerated.  GERD diet  Continue on Prevacid daily  Continue on Xyzal daily.  Follow up with Dr. Valeta Harms 6 months with Spirometry w/ DLCO and As needed      Hoarseness of voice Resolved continue on trigger prevention with postnasal drainage and reflux.  Plan  Patient Instructions  Continue on TRELEGY 1 puff daily, rinse after use.  Albuterol inhaler or Neb as needed  Activity as tolerated.  GERD diet  Continue on Prevacid daily  Continue on Xyzal daily.  Follow up with Dr. Valeta Harms 6 months with Spirometry w/ DLCO and As needed      GERD Continue on Prevacid daily along with GERD diet    Rexene Edison, NP 09/10/2022

## 2022-09-10 NOTE — Assessment & Plan Note (Signed)
Resolved continue on trigger prevention with postnasal drainage and reflux.  Plan  Patient Instructions  Continue on TRELEGY 1 puff daily, rinse after use.  Albuterol inhaler or Neb as needed  Activity as tolerated.  GERD diet  Continue on Prevacid daily  Continue on Xyzal daily.  Follow up with Dr. Valeta Harms 6 months with Spirometry w/ DLCO and As needed

## 2022-09-10 NOTE — Assessment & Plan Note (Signed)
Continue on Prevacid daily along with GERD diet

## 2022-09-10 NOTE — Addendum Note (Signed)
Addended by: Vanessa Barbara on: 09/10/2022 03:48 PM   Modules accepted: Orders

## 2022-09-19 DIAGNOSIS — E118 Type 2 diabetes mellitus with unspecified complications: Secondary | ICD-10-CM | POA: Diagnosis not present

## 2022-09-19 DIAGNOSIS — H409 Unspecified glaucoma: Secondary | ICD-10-CM | POA: Diagnosis not present

## 2022-09-19 DIAGNOSIS — G6289 Other specified polyneuropathies: Secondary | ICD-10-CM | POA: Diagnosis not present

## 2022-09-19 DIAGNOSIS — J449 Chronic obstructive pulmonary disease, unspecified: Secondary | ICD-10-CM | POA: Diagnosis not present

## 2022-09-19 DIAGNOSIS — I739 Peripheral vascular disease, unspecified: Secondary | ICD-10-CM | POA: Diagnosis not present

## 2022-09-19 DIAGNOSIS — M0600 Rheumatoid arthritis without rheumatoid factor, unspecified site: Secondary | ICD-10-CM | POA: Diagnosis not present

## 2022-09-19 DIAGNOSIS — Z1389 Encounter for screening for other disorder: Secondary | ICD-10-CM | POA: Diagnosis not present

## 2022-09-19 DIAGNOSIS — K146 Glossodynia: Secondary | ICD-10-CM | POA: Diagnosis not present

## 2022-09-19 DIAGNOSIS — E039 Hypothyroidism, unspecified: Secondary | ICD-10-CM | POA: Diagnosis not present

## 2022-09-21 DIAGNOSIS — K146 Glossodynia: Secondary | ICD-10-CM | POA: Diagnosis not present

## 2022-09-21 DIAGNOSIS — E611 Iron deficiency: Secondary | ICD-10-CM | POA: Diagnosis not present

## 2022-09-21 DIAGNOSIS — E538 Deficiency of other specified B group vitamins: Secondary | ICD-10-CM | POA: Diagnosis not present

## 2022-09-25 DIAGNOSIS — N399 Disorder of urinary system, unspecified: Secondary | ICD-10-CM | POA: Diagnosis not present

## 2022-09-25 DIAGNOSIS — N390 Urinary tract infection, site not specified: Secondary | ICD-10-CM | POA: Diagnosis not present

## 2022-10-08 DIAGNOSIS — E1040 Type 1 diabetes mellitus with diabetic neuropathy, unspecified: Secondary | ICD-10-CM | POA: Diagnosis not present

## 2022-10-15 DIAGNOSIS — E10649 Type 1 diabetes mellitus with hypoglycemia without coma: Secondary | ICD-10-CM | POA: Diagnosis not present

## 2022-10-24 DIAGNOSIS — E1040 Type 1 diabetes mellitus with diabetic neuropathy, unspecified: Secondary | ICD-10-CM | POA: Diagnosis not present

## 2022-10-24 DIAGNOSIS — E785 Hyperlipidemia, unspecified: Secondary | ICD-10-CM | POA: Diagnosis not present

## 2022-10-24 DIAGNOSIS — I10 Essential (primary) hypertension: Secondary | ICD-10-CM | POA: Diagnosis not present

## 2022-10-24 DIAGNOSIS — Z4681 Encounter for fitting and adjustment of insulin pump: Secondary | ICD-10-CM | POA: Diagnosis not present

## 2022-10-24 DIAGNOSIS — Z794 Long term (current) use of insulin: Secondary | ICD-10-CM | POA: Diagnosis not present

## 2022-10-30 DIAGNOSIS — S93491A Sprain of other ligament of right ankle, initial encounter: Secondary | ICD-10-CM | POA: Diagnosis not present

## 2022-11-07 DIAGNOSIS — I10 Essential (primary) hypertension: Secondary | ICD-10-CM | POA: Diagnosis not present

## 2022-11-07 DIAGNOSIS — M810 Age-related osteoporosis without current pathological fracture: Secondary | ICD-10-CM | POA: Diagnosis not present

## 2022-11-07 DIAGNOSIS — K219 Gastro-esophageal reflux disease without esophagitis: Secondary | ICD-10-CM | POA: Diagnosis not present

## 2022-11-07 DIAGNOSIS — E785 Hyperlipidemia, unspecified: Secondary | ICD-10-CM | POA: Diagnosis not present

## 2022-11-07 DIAGNOSIS — E0500 Thyrotoxicosis with diffuse goiter without thyrotoxic crisis or storm: Secondary | ICD-10-CM | POA: Diagnosis not present

## 2022-11-07 DIAGNOSIS — R7989 Other specified abnormal findings of blood chemistry: Secondary | ICD-10-CM | POA: Diagnosis not present

## 2022-11-07 DIAGNOSIS — E1040 Type 1 diabetes mellitus with diabetic neuropathy, unspecified: Secondary | ICD-10-CM | POA: Diagnosis not present

## 2022-11-14 DIAGNOSIS — F418 Other specified anxiety disorders: Secondary | ICD-10-CM | POA: Diagnosis not present

## 2022-11-14 DIAGNOSIS — R399 Unspecified symptoms and signs involving the genitourinary system: Secondary | ICD-10-CM | POA: Diagnosis not present

## 2022-11-14 DIAGNOSIS — I739 Peripheral vascular disease, unspecified: Secondary | ICD-10-CM | POA: Diagnosis not present

## 2022-11-14 DIAGNOSIS — E1040 Type 1 diabetes mellitus with diabetic neuropathy, unspecified: Secondary | ICD-10-CM | POA: Diagnosis not present

## 2022-11-14 DIAGNOSIS — Z1339 Encounter for screening examination for other mental health and behavioral disorders: Secondary | ICD-10-CM | POA: Diagnosis not present

## 2022-11-14 DIAGNOSIS — R82998 Other abnormal findings in urine: Secondary | ICD-10-CM | POA: Diagnosis not present

## 2022-11-14 DIAGNOSIS — Z0000 Encounter for general adult medical examination without abnormal findings: Secondary | ICD-10-CM | POA: Diagnosis not present

## 2022-11-14 DIAGNOSIS — Z1331 Encounter for screening for depression: Secondary | ICD-10-CM | POA: Diagnosis not present

## 2022-11-14 DIAGNOSIS — F17211 Nicotine dependence, cigarettes, in remission: Secondary | ICD-10-CM | POA: Diagnosis not present

## 2022-11-14 DIAGNOSIS — J439 Emphysema, unspecified: Secondary | ICD-10-CM | POA: Diagnosis not present

## 2022-11-14 DIAGNOSIS — I10 Essential (primary) hypertension: Secondary | ICD-10-CM | POA: Diagnosis not present

## 2022-11-15 DIAGNOSIS — S93491A Sprain of other ligament of right ankle, initial encounter: Secondary | ICD-10-CM | POA: Diagnosis not present

## 2022-11-23 ENCOUNTER — Other Ambulatory Visit: Payer: Self-pay | Admitting: Cardiovascular Disease

## 2022-11-23 DIAGNOSIS — R0789 Other chest pain: Secondary | ICD-10-CM

## 2022-11-23 DIAGNOSIS — E785 Hyperlipidemia, unspecified: Secondary | ICD-10-CM

## 2022-11-23 DIAGNOSIS — I10 Essential (primary) hypertension: Secondary | ICD-10-CM

## 2022-11-23 DIAGNOSIS — Z720 Tobacco use: Secondary | ICD-10-CM

## 2022-11-23 DIAGNOSIS — I739 Peripheral vascular disease, unspecified: Secondary | ICD-10-CM

## 2022-11-26 ENCOUNTER — Telehealth: Payer: Self-pay | Admitting: Pulmonary Disease

## 2022-11-26 ENCOUNTER — Telehealth: Payer: Self-pay | Admitting: Cardiovascular Disease

## 2022-11-26 DIAGNOSIS — E1040 Type 1 diabetes mellitus with diabetic neuropathy, unspecified: Secondary | ICD-10-CM | POA: Diagnosis not present

## 2022-11-26 NOTE — Telephone Encounter (Signed)
Pt called in stating she usually has 3 ultrasounds ordered but only two were ordered this time. She would like the 3rd one ordered.. not sure which one she is referring to. Please advise.   She also asked for it to be scheduled all on Friday. I informed her it would have to be another day because there are other patients coming in before and after her.

## 2022-11-26 NOTE — Telephone Encounter (Signed)
Patient states needs prior authorization for Trelegy 200 mcg. Pharmacy is [facility] mail [facility]. Patient phone number is [phone]. May leave detailed message on voicemail.

## 2022-11-26 NOTE — Telephone Encounter (Signed)
Spoke with pt regarding having additional Lower extremity arterial doppler. It looks like order was cancelled at some point. Will re-instate order. Will send over to scheduler to see if this can be added to dopplers being done on 6/21. Made pt aware that they may not be able to add this order and it may be scheduled on another day. Pt verbalizes understanding. While on phone with pt, went ahead and scheduled her to see Dr. Allyson Sabal in the office.

## 2022-11-28 NOTE — Telephone Encounter (Signed)
PA team can you please assist with Trelegy prior authorization  Please route back to triage

## 2022-11-29 ENCOUNTER — Other Ambulatory Visit (HOSPITAL_COMMUNITY): Payer: Self-pay

## 2022-11-29 DIAGNOSIS — M1712 Unilateral primary osteoarthritis, left knee: Secondary | ICD-10-CM | POA: Diagnosis not present

## 2022-11-29 DIAGNOSIS — S93491A Sprain of other ligament of right ankle, initial encounter: Secondary | ICD-10-CM | POA: Diagnosis not present

## 2022-11-29 NOTE — Telephone Encounter (Signed)
Trelegy does not require PA, test claim shows $45.00 co-pay with [employer].

## 2022-11-29 NOTE — Telephone Encounter (Signed)
ATC X1 LVM for patient to call the office back 

## 2022-11-30 ENCOUNTER — Encounter (HOSPITAL_COMMUNITY): Payer: Self-pay

## 2022-11-30 ENCOUNTER — Ambulatory Visit (HOSPITAL_COMMUNITY): Payer: Medicare HMO

## 2022-12-03 ENCOUNTER — Other Ambulatory Visit (HOSPITAL_COMMUNITY): Payer: Self-pay

## 2022-12-05 NOTE — Telephone Encounter (Signed)
PT missed Paige's CB. Please try again. I read her the notes from PA team and she says her father in law gets it for free and she "...does not want to deal with [facility]'s."  [facility] told her to just call us and ask [place] to do a PA and she could get it free too.   Her # is [phone]

## 2022-12-06 NOTE — Telephone Encounter (Signed)
Spoke with the pt and explained that her ins does not require PA for trelegy  She argued that she called [facility] and they told her that if we sent rx there it would be free  Pharm team, can you tell us if this is correct? Thank you so much!

## 2022-12-07 MED ORDER — TRELEGY ELLIPTA 200-62.5-25 MCG/ACT IN AEPB: 1.0000 | INHALATION_SPRAY | Freq: Every day | RESPIRATORY_TRACT | 0 refills | Status: DC

## 2022-12-07 NOTE — Telephone Encounter (Signed)
Spoke with pt who is requesting 1 month supply of Trelegy be sent into [facility]. Pt informed must delivery pharmacy require 3 month supplies. Pt insisted we send in order anyway and "see what happens". Order placed and nothing further needed at this time.

## 2022-12-07 NOTE — Telephone Encounter (Signed)
I would not know the price through [facility], all I can see is that the medication does not require a PA at this time. It depends on her plan what the price will be at other pharmacies.

## 2022-12-08 DIAGNOSIS — E1040 Type 1 diabetes mellitus with diabetic neuropathy, unspecified: Secondary | ICD-10-CM | POA: Diagnosis not present

## 2022-12-11 DIAGNOSIS — R058 Other specified cough: Secondary | ICD-10-CM | POA: Diagnosis not present

## 2022-12-11 DIAGNOSIS — J040 Acute laryngitis: Secondary | ICD-10-CM | POA: Diagnosis not present

## 2022-12-11 DIAGNOSIS — G4733 Obstructive sleep apnea (adult) (pediatric): Secondary | ICD-10-CM | POA: Diagnosis not present

## 2022-12-11 DIAGNOSIS — J439 Emphysema, unspecified: Secondary | ICD-10-CM | POA: Diagnosis not present

## 2022-12-11 DIAGNOSIS — E1040 Type 1 diabetes mellitus with diabetic neuropathy, unspecified: Secondary | ICD-10-CM | POA: Diagnosis not present

## 2022-12-18 ENCOUNTER — Telehealth: Payer: Self-pay | Admitting: Pulmonary Disease

## 2022-12-18 NOTE — Telephone Encounter (Signed)
[employer] calling to check on disability forms for PT  Ref#: [id number]

## 2022-12-19 NOTE — Telephone Encounter (Signed)
I found form and will have Dr Tonia Brooms sign it next week when he's back in clinic and fax it to designated party. NFN

## 2022-12-24 ENCOUNTER — Other Ambulatory Visit (HOSPITAL_COMMUNITY): Payer: Self-pay | Admitting: Cardiovascular Disease

## 2022-12-24 DIAGNOSIS — I739 Peripheral vascular disease, unspecified: Secondary | ICD-10-CM

## 2022-12-26 ENCOUNTER — Ambulatory Visit (HOSPITAL_COMMUNITY): Payer: Medicare HMO

## 2022-12-27 DIAGNOSIS — K3184 Gastroparesis: Secondary | ICD-10-CM | POA: Diagnosis not present

## 2022-12-27 DIAGNOSIS — J439 Emphysema, unspecified: Secondary | ICD-10-CM | POA: Diagnosis not present

## 2022-12-27 DIAGNOSIS — F418 Other specified anxiety disorders: Secondary | ICD-10-CM | POA: Diagnosis not present

## 2022-12-27 DIAGNOSIS — F4321 Adjustment disorder with depressed mood: Secondary | ICD-10-CM | POA: Diagnosis not present

## 2022-12-27 DIAGNOSIS — I739 Peripheral vascular disease, unspecified: Secondary | ICD-10-CM | POA: Diagnosis not present

## 2022-12-27 DIAGNOSIS — E1040 Type 1 diabetes mellitus with diabetic neuropathy, unspecified: Secondary | ICD-10-CM | POA: Diagnosis not present

## 2022-12-27 DIAGNOSIS — R55 Syncope and collapse: Secondary | ICD-10-CM | POA: Diagnosis not present

## 2022-12-27 DIAGNOSIS — H15003 Unspecified scleritis, bilateral: Secondary | ICD-10-CM | POA: Diagnosis not present

## 2022-12-27 DIAGNOSIS — I10 Essential (primary) hypertension: Secondary | ICD-10-CM | POA: Diagnosis not present

## 2023-01-07 DIAGNOSIS — E1040 Type 1 diabetes mellitus with diabetic neuropathy, unspecified: Secondary | ICD-10-CM | POA: Diagnosis not present

## 2023-01-09 ENCOUNTER — Ambulatory Visit: Payer: Medicare HMO | Admitting: Cardiovascular Disease

## 2023-01-16 ENCOUNTER — Encounter: Payer: Self-pay | Admitting: Acute Care

## 2023-01-28 DIAGNOSIS — Z79899 Other long term (current) drug therapy: Secondary | ICD-10-CM | POA: Diagnosis not present

## 2023-01-28 DIAGNOSIS — H30033 Focal chorioretinal inflammation, peripheral, bilateral: Secondary | ICD-10-CM | POA: Diagnosis not present

## 2023-01-31 DIAGNOSIS — M1712 Unilateral primary osteoarthritis, left knee: Secondary | ICD-10-CM | POA: Diagnosis not present

## 2023-01-31 DIAGNOSIS — S83242A Other tear of medial meniscus, current injury, left knee, initial encounter: Secondary | ICD-10-CM | POA: Diagnosis not present

## 2023-02-01 ENCOUNTER — Telehealth: Payer: Self-pay | Admitting: Adult Health

## 2023-02-01 NOTE — Telephone Encounter (Signed)
Received a faxed request from [employer] for office notes from 09/10/22.   Faxed notes to [employer] fax# [phone]

## 2023-02-04 ENCOUNTER — Telehealth: Payer: Self-pay | Admitting: Cardiovascular Disease

## 2023-02-04 NOTE — Telephone Encounter (Signed)
Will forward message to Dr. Allyson Sabal and his nurse.

## 2023-02-04 NOTE — Telephone Encounter (Signed)
Ashley [name] from [employer] called. He mentioned that the paperwork for the long-term disability form was faxed to Dr. Allyson Sabal on 02/01/23. They would like to confirm if Dr. Allyson Sabal has received it and inquire about the expected turnaround time for completing and returning the paperwork

## 2023-02-04 NOTE — Telephone Encounter (Signed)
Called [employer], spoke with Lequita Halt, let representative know that paperwork was received however Dr. Allyson Sabal is unable to fill out paperwork for long term disability. Lequita Halt verbalizes understanding and will make a note in pt's chart.

## 2023-02-05 DIAGNOSIS — Z79899 Other long term (current) drug therapy: Secondary | ICD-10-CM | POA: Diagnosis not present

## 2023-02-05 DIAGNOSIS — H30033 Focal chorioretinal inflammation, peripheral, bilateral: Secondary | ICD-10-CM | POA: Diagnosis not present

## 2023-02-05 DIAGNOSIS — Z961 Presence of intraocular lens: Secondary | ICD-10-CM | POA: Diagnosis not present

## 2023-02-05 DIAGNOSIS — E109 Type 1 diabetes mellitus without complications: Secondary | ICD-10-CM | POA: Diagnosis not present

## 2023-02-05 DIAGNOSIS — H3581 Retinal edema: Secondary | ICD-10-CM | POA: Diagnosis not present

## 2023-02-07 DIAGNOSIS — E1040 Type 1 diabetes mellitus with diabetic neuropathy, unspecified: Secondary | ICD-10-CM | POA: Diagnosis not present

## 2023-02-12 ENCOUNTER — Ambulatory Visit
Admission: RE | Admit: 2023-02-12 | Discharge: 2023-02-12 | Disposition: A | Discharge status: Home / Self Care | Payer: Medicare HMO | Source: Ambulatory Visit | Attending: Acute Care | Admitting: Acute Care

## 2023-02-12 DIAGNOSIS — Z122 Encounter for screening for malignant neoplasm of respiratory organs: Secondary | ICD-10-CM

## 2023-02-12 DIAGNOSIS — J439 Emphysema, unspecified: Secondary | ICD-10-CM | POA: Diagnosis not present

## 2023-02-12 DIAGNOSIS — Z87891 Personal history of nicotine dependence: Secondary | ICD-10-CM | POA: Diagnosis not present

## 2023-02-12 DIAGNOSIS — I700 Atherosclerosis of aorta: Secondary | ICD-10-CM | POA: Diagnosis not present

## 2023-02-14 ENCOUNTER — Other Ambulatory Visit: Payer: Self-pay

## 2023-02-14 DIAGNOSIS — J449 Chronic obstructive pulmonary disease, unspecified: Secondary | ICD-10-CM

## 2023-02-14 DIAGNOSIS — R0602 Shortness of breath: Secondary | ICD-10-CM

## 2023-02-15 ENCOUNTER — Ambulatory Visit: Payer: Medicare HMO | Admitting: Pulmonary Disease

## 2023-02-15 ENCOUNTER — Encounter: Payer: Self-pay | Admitting: Pulmonary Disease

## 2023-02-15 VITALS — BP 100/60 | HR 81 | Ht 61.0 in | Wt 161.4 lb

## 2023-02-15 DIAGNOSIS — Z87891 Personal history of nicotine dependence: Secondary | ICD-10-CM

## 2023-02-15 DIAGNOSIS — R0602 Shortness of breath: Secondary | ICD-10-CM | POA: Diagnosis not present

## 2023-02-15 DIAGNOSIS — R490 Dysphonia: Secondary | ICD-10-CM | POA: Diagnosis not present

## 2023-02-15 DIAGNOSIS — G4733 Obstructive sleep apnea (adult) (pediatric): Secondary | ICD-10-CM | POA: Diagnosis not present

## 2023-02-15 DIAGNOSIS — J449 Chronic obstructive pulmonary disease, unspecified: Secondary | ICD-10-CM

## 2023-02-15 DIAGNOSIS — J4520 Mild intermittent asthma, uncomplicated: Secondary | ICD-10-CM | POA: Diagnosis not present

## 2023-02-15 LAB — PULMONARY FUNCTION TEST
DL/VA % pred: 103 %
DL/VA: 4.51 ml/min/mmHg/L
DLCO cor % pred: 84 %
DLCO cor: 15.62 ml/min/mmHg
DLCO unc % pred: 80 %
DLCO unc: 14.85 ml/min/mmHg
FEF 25-75 Pre: 2.39 L/s
FEF2575-%Pred-Pre: 103 %
FEV1-%Pred-Pre: 82 %
FEV1-Pre: 1.95 L
FEV1FVC-%Pred-Pre: 108 %
FEV6-%Pred-Pre: 78 %
FEV6-Pre: 2.29 L
FEV6FVC-%Pred-Pre: 103 %
FVC-%Pred-Pre: 75 %
FVC-Pre: 2.29 L
Pre FEV1/FVC ratio: 85 %
Pre FEV6/FVC Ratio: 100 %

## 2023-02-15 NOTE — Patient Instructions (Signed)
Spiro/DLCO performed today. 

## 2023-02-15 NOTE — Progress Notes (Signed)
Spiro/DLCO performed today. 

## 2023-02-15 NOTE — Progress Notes (Signed)
Synopsis: Referred in July 2022 for history of covid, by Chilton Greathouse, MD  Subjective:   PATIENT ID: Ashley [name] GENDER: female DOB: 07-27-1965, MRN: [id number]  Chief Complaint  Patient presents with   Follow-up    F/up on PFT    57 year old female, past medical history of COPD, former smoker, 40 years, half pack per day, 20-pack-year history, quit January 2021.  Gastroparesis, GERD, hypertension, hyperlipidemia.  Patient developed COVID-19 bilateral pneumonia in January 2022.  She was admitted to [facility] was intubated and placed on mechanical life support for 8 days.  She has had a subsequent long recovery but is now off oxygen.  She still feels short of breath and has cough at times.  OV 06/07/2021: Here today for follow-up regarding COPD/asthma.  She is also post COVID with ongoing symptoms despite inhaler regimen.  She is currently on Breo plus Pulmicort plus as needed albuterol.  Last set of labs were completed during a hospitalization with no significant eosinophilic elevation, eosinophils were 100 but she was on prednisone during this hospitalization back in January 2022.  She still has triggers from smoke or any kind of particulate matter.  Strong smells in the cold weather are still stimulating her to have difficulty breathing.  It seems that she has more of a reactive airway at this time.  Of note she recently had a urinary tract infection was given Macrobid.  She is allergic to sulfa medications.  She broke out in a diffuse morbilliform rash.  OV 09/07/2021: Here today for follow-up regarding asthma.  Initially was seen post COVID.  Currently at that time was on Breo and Pulmicort.  We switched her to [place].  She used her inhalers for a short period and has fallen off using any of those.  At this point maintaining on albuterol.  She went through a whole lot in January of this past year after her husband died from a heart attack at home.  She still  unfortunately is having significant shortness of breath.  OV 01/17/2022: She is here today for follow-up regarding asthma.  For some reason she still is not using a maintenance inhaler regularly.  She also never had the Pulmicort filled has not been using this.  She was given Breztri at her last office visit with me and not been using that.  She had RAST panel testing that was unremarkable.  IgE also unremarkable.  From respiratory standpoint she is still using her albuterol daily.  Usually gets 1 albuterol inhaler per month from the pharmacy.  OV 02/15/2023: Here today for follow-up.  She has asthma.  Using albuterol as needed.  She is to be a former smoker.  She had pulmonary function test that were complete today that shows an FVC of 75% predicted, FEV1 of 82% predicted, normal ratio.  DLCO of 80% predicted.  From respiratory standpoint she feels good.  She still frustrated with her hoarseness of voice.  She would like to see an ENT regarding this.  She is noncompliant with CPAP for her OSA and noncompliant with her regimen for treatment of her reflux.     Past Medical History:  Diagnosis Date   Anxiety    COPD (chronic obstructive pulmonary disease) (HCC)    Depression    DOE (dyspnea on exertion)    Dyspnea    Gastroparesis    GERD (gastroesophageal reflux disease)    Glaucoma, both eyes    History of 2019 novel coronavirus disease (COVID-19)  06/20/2020   hospital admission-- dx severe covid with pneumonia, ARDS, DKA, COPD exaberation;  pt intubated 01-13th and extubated 01-20th;   (11-15-2020  per pt did not go home on oxygen, residual generalized weakness and hair loss)   History of esophageal stricture    post dilatation, followed by dr Marina Goodell   History of Graves' disease    s/p RAI  03-03-2010   History of Helicobacter pylori infection    remote hx and treatment   History of kidney stones    Hyperlipidemia    Hypertension    followed by pcp   Hypothyroidism, postradioiodine  therapy 02/2010   followed by pcp   IDA (iron deficiency anemia)    Insulin pump in place    w/ Novolog , managed by pcp   Moderate asthma    followed by pcp,  (11-15-2020  per pt has pulmonology appt on 11-17-2020))   OSA (obstructive sleep apnea)    11-15-2020  per pt has not used cpap since 05/ 2021  (previous followed by dr Vickey Huger,  study in epic 05-12-2018 moderate complex osa pt had used for awhile)   Osteoporosis    PAD (peripheral artery disease) (HCC)    followed by dr berry----  s/p bilateral common iliac artery angioplasty stenting for stenosis   Peripheral neuropathy    Pneumonia    covid 2022   PONV (postoperative nausea and vomiting)    Rheumatoid arthritis (HCC)    in both eyes   Right wrist fracture    Type 1 diabetes mellitus with long-term current use of insulin (HCC)    per pt dx at age 50 approx,  followed by pcp,  (11-15-2020  per pt last A1c 7.2 in 04/ 2022),  checks blood sugar 6 to 10 times daily , fasting sugar-- 200 -- 300)   Vitamin D deficiency      Family History  Problem Relation Age of Onset   Diabetes Father    Heart attack Father    Alcoholism Father    Lung cancer Mother    Liver cancer Mother    Irritable bowel syndrome Sister    Colon cancer Neg Hx    Esophageal cancer Neg Hx    Rectal cancer Neg Hx    Stomach cancer Neg Hx      Past Surgical History:  Procedure Laterality Date   ABDOMINAL AORTOGRAM W/LOWER EXTREMITY Bilateral 01/01/2019   Procedure: ABDOMINAL AORTOGRAM W/LOWER EXTREMITY;  Surgeon: Runell Gess, MD;  Location: [facility];  Service: Cardiovascular;  Laterality: Bilateral;   BREAST EXCISIONAL BIOPSY Left 04-*02-2000  @[facility]    per pt benign   BREAST SURGERY     CATARACT EXTRACTION W/ INTRAOCULAR LENS  IMPLANT, BILATERAL  11/2019   COLONOSCOPY  last one 2019 approx   CYSTOSCOPY WITH STENT PLACEMENT Right 07/22/2021   Procedure: CYSTOSCOPY WITH STENT PLACEMENT;  Surgeon: Sondra Come, MD;  Location: [facility]  ORS;  Service: Urology;  Laterality: Right;   CYSTOSCOPY/URETEROSCOPY/HOLMIUM LASER/STENT PLACEMENT Right 08/11/2021   Procedure: CYSTOSCOPY/URETEROSCOPY/HOLMIUM LASER/STENT EXCHANGE;  Surgeon: Sondra Come, MD;  Location: [facility] ORS;  Service: Urology;  Laterality: Right;   FRACTURE SURGERY     LAPAROSCOPIC ASSISTED VAGINAL HYSTERECTOMY  06-22-2004 @[facility]    NASAL SEPTOPLASTY W/ TURBINOPLASTY Bilateral 09/07/2013   Procedure: SEPTOPLASTY, BILATERAL TURBINATE RESECTION ;  Surgeon: Darletta Moll, MD;  Location: [facility];  Service: ENT;  Laterality: Bilateral;   OPEN REDUCTION INTERNAL FIXATION (ORIF) DISTAL RADIAL FRACTURE Right 11/16/2020  Procedure: OPEN REDUCTION INTERNAL FIXATION (ORIF) DISTAL RADIAL FRACTURE;  Surgeon: Bjorn Pippin, MD;  Location: [facility];  Service: Orthopedics;  Laterality: Right;   PERIPHERAL VASCULAR INTERVENTION Bilateral 01/01/2019   Procedure: PERIPHERAL VASCULAR INTERVENTION;  Surgeon: Runell Gess, MD;  Location: [facility];  Service: Cardiovascular;  Laterality: Bilateral;   UPPER GASTROINTESTINAL ENDOSCOPY  last one 07-23-2019  dr Marina Goodell    Social History   Socioeconomic History   Marital status: Widowed    Spouse name: Not on file   Number of children: 1   Years of education: Not on file   Highest education level: Not on file  Occupational History   Not on file  Tobacco Use   Smoking status: Former    Current packs/day: 0.00    Average packs/day: 0.5 packs/day for 40.0 years (20.0 ttl pk-yrs)    Types: Cigarettes    Start date: 37    Quit date: 06/20/2020    Years since quitting: 2.6   Smokeless tobacco: Never   Tobacco comments:    Started smoking at age [age]  Vaping Use   Vaping status: Never Used  Substance and Sexual Activity   Alcohol use: No    Alcohol/week: 0.0 standard drinks of alcohol   Drug use: No   Sexual activity: Not Currently  Other Topics Concern   Not on file  Social History  Narrative    Education 12th grade.  Caffeine 4-5 drinks daily.   Lives alone   Social Determinants of Health   Financial Resource Strain: Not on file  Food Insecurity: Not on file  Transportation Needs: Not on file  Physical Activity: Not on file  Stress: Not on file  Social Connections: Unknown (10/23/2021)   [facility], [facility]   Social Network    Social Network: Not on file  Intimate Partner Violence: Unknown (09/14/2021)   Received from [facility], [facility]   HITS    Physically Hurt: Not on file    Insult or Talk Down To: Not on file    Threaten Physical Harm: Not on file    Scream or Curse: Not on file     Allergies  Allergen Reactions   Mirtazapine Hives and Rash   Clindamycin Hives   Macrobid [Nitrofurantoin] Hives   Other Hives    Tylox    Sulfonamide Derivatives Hives   Alendronate Sodium Itching   Fosamax [Alendronate]     Other reaction(s): HIVES   Lipitor [Atorvastatin] Other (See Comments)    MUSCLE ACHES   Restasis [Cyclosporine]     Pt stated, "My eyes turned red on the inside and outside of eye; burning sensation"   Sulfa Antibiotics Hives   Topamax [Topiramate] Itching     Outpatient Medications Prior to Visit  Medication Sig Dispense Refill   ACCU-CHEK COMPACT PLUS test strip 1 each by Other route as needed.      ACCU-CHEK SOFTCLIX LANCETS lancets      acetaminophen (TYLENOL) 500 MG tablet Take 1,000 mg by mouth every 6 (six) hours as needed for moderate pain.     albuterol (PROVENTIL) (2.5 MG/3ML) 0.083% nebulizer solution Take 3 mLs (2.5 mg total) by nebulization every 4 (four) hours as needed for wheezing or shortness of breath. 75 mL 3   Albuterol Sulfate, sensor, (PROAIR DIGIHALER) 108 (90 Base) MCG/ACT AEPB Inhale 2 puffs into the lungs every 6 (six) hours as needed (shortness of breath).     ALPRAZolam Prudy Feeler)  0.5 MG tablet Take 0.5-1 mg by mouth 3 (three) times daily as needed for anxiety.     AMBULATORY NON  FORMULARY MEDICATION NOVOLOG INSULIN PUMP USES AS DIRECTED     aspirin EC 81 MG tablet Take 81 mg by mouth daily. Swallow whole.     Blood Glucose Monitoring Suppl (ACCU-CHEK AVIVA PLUS) w/Device KIT      clopidogrel (PLAVIX) 75 MG tablet TAKE 1 TABLET EVERY DAY WITH BREAKFAST 90 tablet 3   Fluticasone-Umeclidin-Vilant (TRELEGY ELLIPTA) 200-62.5-25 MCG/ACT AEPB Inhale 1 puff into the lungs daily. 60 each 0   folic acid (FOLVITE) 1 MG tablet Take 1 tablet by mouth daily.     Insulin Human (INSULIN PUMP) SOLN Inject into the skin. Novolog----  basal rate--- 12 am - 2 am = 1.3, 2 am - 6 am = 1.4, 6 am - 3 pm = 1.8, 3 pm - 12 am = 1.55     lansoprazole (PREVACID) 30 MG capsule Take 1 capsule (30 mg total) by mouth 2 (two) times daily before a meal. 180 capsule 1   latanoprost (XALATAN) 0.005 % ophthalmic solution Place 1 drop into both eyes at bedtime.  11   levothyroxine (SYNTHROID) 125 MCG tablet Take 125 mcg by mouth daily before breakfast.     methotrexate (RHEUMATREX) 2.5 MG tablet Take 6 tablets (15 mg total) by mouth once a week. 3 tablets am and at bedtime twice a day once a week,  Saturday's (Patient taking differently: Take 25 mg by mouth once a week. 5 tablets am and at bedtime twice a day once a week,  Saturday's)     metoprolol succinate (TOPROL-XL) 50 MG 24 hr tablet Take 50 mg by mouth daily. Take with or immediately following a meal.     montelukast (SINGULAIR) 10 MG tablet Take 10 mg by mouth at bedtime.     NOVOLOG 100 UNIT/ML injection Inject into the skin See admin instructions. Pt has Insulin pump     pregabalin (LYRICA) 75 MG capsule Take 75-150 mg by mouth See admin instructions. Take 75 mg in the morning, 75 mg at lunch, and 150 mg at bedtime     rosuvastatin (CRESTOR) 40 MG tablet Take 40 mg by mouth daily.     Sertraline HCl 200 MG CAPS Take 200 mg by mouth daily.     Vitamin D, Ergocalciferol, (DRISDOL) 50000 UNITS CAPS Take 50,000 Units by mouth every Saturday.      budesonide (PULMICORT) 0.25 MG/2ML nebulizer solution Take 2 mLs (0.25 mg total) by nebulization 2 (two) times daily as needed (wheezing/dyspnea). 60 mL 1   Fluticasone-Umeclidin-Vilant (TRELEGY ELLIPTA) 100-62.5-25 MCG/ACT AEPB Inhale 1 puff into the lungs daily at 6 (six) AM. 2 each 0   levocetirizine (XYZAL) 5 MG tablet Take 5 mg by mouth every morning.      No facility-administered medications prior to visit.    Review of Systems  Constitutional:  Negative for chills, fever, malaise/fatigue and weight loss.  HENT:  Negative for hearing loss, sore throat and tinnitus.   Eyes:  Negative for blurred vision and double vision.  Respiratory:  Positive for cough. Negative for hemoptysis, sputum production, shortness of breath, wheezing and stridor.   Cardiovascular:  Negative for chest pain, palpitations, orthopnea, leg swelling and PND.  Gastrointestinal:  Negative for abdominal pain, constipation, diarrhea, heartburn, nausea and vomiting.  Genitourinary:  Negative for dysuria, hematuria and urgency.  Musculoskeletal:  Negative for joint pain and myalgias.  Skin:  Negative for itching  and rash.  Neurological:  Negative for dizziness, tingling, weakness and headaches.  Endo/Heme/Allergies:  Negative for environmental allergies. Does not bruise/bleed easily.  Psychiatric/Behavioral:  Negative for depression. The patient is not nervous/anxious and does not have insomnia.   All other systems reviewed and are negative.    Objective:  Physical Exam Vitals reviewed.  Constitutional:      General: She is not in acute distress.    Appearance: She is well-developed. She is obese.  HENT:     Head: Normocephalic and atraumatic.  Eyes:     General: No scleral icterus.    Conjunctiva/sclera: Conjunctivae normal.     Pupils: Pupils are equal, round, and reactive to light.  Neck:     Vascular: No JVD.     Trachea: No tracheal deviation.  Cardiovascular:     Rate and Rhythm: Normal rate and  regular rhythm.     Heart sounds: No murmur heard. Pulmonary:     Effort: Pulmonary effort is normal. No tachypnea, accessory muscle usage or respiratory distress.     Breath sounds: No stridor. No wheezing, rhonchi or rales.  Abdominal:     General: There is no distension.     Palpations: Abdomen is soft.     Tenderness: There is no abdominal tenderness.  Musculoskeletal:        General: No tenderness.     Cervical back: Neck supple.  Lymphadenopathy:     Cervical: No cervical adenopathy.  Skin:    General: Skin is warm and dry.     Capillary Refill: Capillary refill takes less than 2 seconds.     Findings: No rash.  Neurological:     Mental Status: She is alert and oriented to person, place, and time.  Psychiatric:        Behavior: Behavior normal.       Vitals:   02/15/23 1430  BP: 100/60  Pulse: 81  SpO2: 96%  Weight: 161 lb 6.4 oz (73.2 kg)  Height: 5\' 1"  (1.549 m)   96% on RA BMI Readings from Last 3 Encounters:  02/15/23 30.50 kg/m  09/10/22 30.38 kg/m  04/19/22 32.15 kg/m   Wt Readings from Last 3 Encounters:  02/15/23 161 lb 6.4 oz (73.2 kg)  09/10/22 150 lb 6.4 oz (68.2 kg)  04/19/22 159 lb 3.2 oz (72.2 kg)     CBC    Component Value Date/Time   WBC 10.4 03/04/2022 2047   RBC 4.37 03/04/2022 2047   HGB 12.5 03/04/2022 2047   HGB 11.6 01/09/2019 1204   HCT 38.7 03/04/2022 2047   HCT 36.2 01/09/2019 1204   PLT 229 03/04/2022 2047   PLT 388 01/09/2019 1204   MCV 88.6 03/04/2022 2047   MCV 82 01/09/2019 1204   MCH 28.6 03/04/2022 2047   MCHC 32.3 03/04/2022 2047   RDW 16.2 (H) 03/04/2022 2047   RDW 13.5 01/09/2019 1204   LYMPHSABS 1.3 03/04/2022 2047   MONOABS 0.7 03/04/2022 2047   EOSABS 0.1 03/04/2022 2047   BASOSABS 0.0 03/04/2022 2047    Chest Imaging: No recent chest imaging  CTA chest 06/21/2020: Bilateral groundglass opacities concerning for multifocal pneumonia related to COVID-19, enlarged mediastinal and hilar node consider  reactive. No evidence of PE. The patient's images have been independently reviewed by me.    02/12/2023 lung cancer screening CT: CT image has not been read. Awaiting report from radiology.  Pulmonary Functions Testing Results:    Latest Ref Rng & Units 02/14/2023  4:18 PM 02/02/2021   12:45 PM  PFT Results  FVC-Pre L 2.29  P 2.25   FVC-Predicted Pre % 75  P 76   FVC-Post L  2.22   FVC-Predicted Post %  75   Pre FEV1/FVC % % 85  P 85   Post FEV1/FCV % %  87   FEV1-Pre L 1.95  P 1.91   FEV1-Predicted Pre % 82  P 82   FEV1-Post L  1.94   DLCO uncorrected ml/min/mmHg 14.85  P 14.42   DLCO UNC% % 80  P 80   DLCO corrected ml/min/mmHg 15.62  P 14.84   DLCO COR %Predicted % 84  P 82   DLVA Predicted % 103  P 94   TLC L  3.89   TLC % Predicted %  87   RV % Predicted %  81     P Preliminary result    FeNO:   Pathology:   Echocardiogram:   Heart Catheterization:     Assessment & Plan:     ICD-10-CM   1. Chronic hoarseness  R49.0 Ambulatory referral to ENT    2. Former smoker  Z87.891     3. Mild intermittent asthma, unspecified whether complicated  J45.20     4. OSA (obstructive sleep apnea)  G47.33       Discussion:  This is a 57 year old female, history of COVID-19 bilateral pulmonary disease requiring mechanical support and an 8-day hospital stay.  She is a former smoker 40 years, 20-pack-year history.  She had reactive airway symptoms following this thought to be related to COPD/COVID/asthma overlap.  Her immunoprofile was predominantly neutrophilic.  She has RAST and IgE that was negative.  Plan: At this point she is off maintenance inhalers as it did not make much difference in her symptoms. Continue albuterol as needed. She has Pulmicort that she uses as needed if she has a flareup. She is only had 1 exacerbation this year requiring antibiotics and steroids. I think she needs treatment for obstructive sleep apnea and return to see her sleep doc. She  needs to be compliant with her mask if that is what is recommended. She needs to treat her reflux. As she requests a referral to ENT to evaluation of her hoarseness. Continue annual enrollment in lung cancer screening program.      Current Outpatient Medications:    ACCU-CHEK COMPACT PLUS test strip, 1 each by Other route as needed. , Disp: , Rfl:    ACCU-CHEK SOFTCLIX LANCETS lancets, , Disp: , Rfl:    acetaminophen (TYLENOL) 500 MG tablet, Take 1,000 mg by mouth every 6 (six) hours as needed for moderate pain., Disp: , Rfl:    albuterol (PROVENTIL) (2.5 MG/3ML) 0.083% nebulizer solution, Take 3 mLs (2.5 mg total) by nebulization every 4 (four) hours as needed for wheezing or shortness of breath., Disp: 75 mL, Rfl: 3   Albuterol Sulfate, sensor, (PROAIR DIGIHALER) 108 (90 Base) MCG/ACT AEPB, Inhale 2 puffs into the lungs every 6 (six) hours as needed (shortness of breath)., Disp: , Rfl:    ALPRAZolam (XANAX) 0.5 MG tablet, Take 0.5-1 mg by mouth 3 (three) times daily as needed for anxiety., Disp: , Rfl:    AMBULATORY NON FORMULARY MEDICATION, NOVOLOG INSULIN PUMP USES AS DIRECTED, Disp: , Rfl:    aspirin EC 81 MG tablet, Take 81 mg by mouth daily. Swallow whole., Disp: , Rfl:    Blood Glucose Monitoring Suppl (ACCU-CHEK AVIVA PLUS) w/Device KIT, , Disp: , Rfl:  clopidogrel (PLAVIX) 75 MG tablet, TAKE 1 TABLET EVERY DAY WITH BREAKFAST, Disp: 90 tablet, Rfl: 3   Fluticasone-Umeclidin-Vilant (TRELEGY ELLIPTA) 200-62.5-25 MCG/ACT AEPB, Inhale 1 puff into the lungs daily., Disp: 60 each, Rfl: 0   folic acid (FOLVITE) 1 MG tablet, Take 1 tablet by mouth daily., Disp: , Rfl:    Insulin Human (INSULIN PUMP) SOLN, Inject into the skin. Novolog----  basal rate--- 12 am - 2 am = 1.3, 2 am - 6 am = 1.4, 6 am - 3 pm = 1.8, 3 pm - 12 am = 1.55, Disp: , Rfl:    lansoprazole (PREVACID) 30 MG capsule, Take 1 capsule (30 mg total) by mouth 2 (two) times daily before a meal., Disp: 180 capsule, Rfl: 1    latanoprost (XALATAN) 0.005 % ophthalmic solution, Place 1 drop into both eyes at bedtime., Disp: , Rfl: 11   levothyroxine (SYNTHROID) 125 MCG tablet, Take 125 mcg by mouth daily before breakfast., Disp: , Rfl:    methotrexate (RHEUMATREX) 2.5 MG tablet, Take 6 tablets (15 mg total) by mouth once a week. 3 tablets am and at bedtime twice a day once a week,  Saturday's (Patient taking differently: Take 25 mg by mouth once a week. 5 tablets am and at bedtime twice a day once a week,  Saturday's), Disp: , Rfl:    metoprolol succinate (TOPROL-XL) 50 MG 24 hr tablet, Take 50 mg by mouth daily. Take with or immediately following a meal., Disp: , Rfl:    montelukast (SINGULAIR) 10 MG tablet, Take 10 mg by mouth at bedtime., Disp: , Rfl:    NOVOLOG 100 UNIT/ML injection, Inject into the skin See admin instructions. Pt has Insulin pump, Disp: , Rfl:    pregabalin (LYRICA) 75 MG capsule, Take 75-150 mg by mouth See admin instructions. Take 75 mg in the morning, 75 mg at lunch, and 150 mg at bedtime, Disp: , Rfl:    rosuvastatin (CRESTOR) 40 MG tablet, Take 40 mg by mouth daily., Disp: , Rfl:    Sertraline HCl 200 MG CAPS, Take 200 mg by mouth daily., Disp: , Rfl:    Vitamin D, Ergocalciferol, (DRISDOL) 50000 UNITS CAPS, Take 50,000 Units by mouth every Saturday., Disp: , Rfl:    budesonide (PULMICORT) 0.25 MG/2ML nebulizer solution, Take 2 mLs (0.25 mg total) by nebulization 2 (two) times daily as needed (wheezing/dyspnea)., Disp: 60 mL, Rfl: 1   Josephine Igo, DO [facility] 02/15/2023 2:52 PM

## 2023-02-15 NOTE — Patient Instructions (Signed)
Thank you for visiting Dr. Tonia Brooms at [facility]. Today we recommend the following:  Orders Placed This Encounter  Procedures   Ambulatory referral to ENT   Continue albuterol as needed  Follow up with Sleep Doc   Return in about 1 year (around 02/15/2024) for with APP or Dr. Tonia Brooms.    Please do your part to reduce the spread of COVID-19.

## 2023-02-19 DIAGNOSIS — E1040 Type 1 diabetes mellitus with diabetic neuropathy, unspecified: Secondary | ICD-10-CM | POA: Diagnosis not present

## 2023-02-22 ENCOUNTER — Other Ambulatory Visit: Payer: Self-pay | Admitting: Acute Care

## 2023-02-22 DIAGNOSIS — Z122 Encounter for screening for malignant neoplasm of respiratory organs: Secondary | ICD-10-CM

## 2023-02-22 DIAGNOSIS — Z87891 Personal history of nicotine dependence: Secondary | ICD-10-CM

## 2023-02-25 NOTE — Telephone Encounter (Signed)
Called and spoke with patient and is aware Dr Allyson Sabal will not be filling out papeperwork.

## 2023-03-01 DIAGNOSIS — M25562 Pain in left knee: Secondary | ICD-10-CM | POA: Diagnosis not present

## 2023-03-10 DIAGNOSIS — E1040 Type 1 diabetes mellitus with diabetic neuropathy, unspecified: Secondary | ICD-10-CM | POA: Diagnosis not present

## 2023-03-12 DIAGNOSIS — E1040 Type 1 diabetes mellitus with diabetic neuropathy, unspecified: Secondary | ICD-10-CM | POA: Diagnosis not present

## 2023-03-12 DIAGNOSIS — Z23 Encounter for immunization: Secondary | ICD-10-CM | POA: Diagnosis not present

## 2023-03-12 DIAGNOSIS — Z794 Long term (current) use of insulin: Secondary | ICD-10-CM | POA: Diagnosis not present

## 2023-03-12 DIAGNOSIS — E785 Hyperlipidemia, unspecified: Secondary | ICD-10-CM | POA: Diagnosis not present

## 2023-03-12 DIAGNOSIS — Z4681 Encounter for fitting and adjustment of insulin pump: Secondary | ICD-10-CM | POA: Diagnosis not present

## 2023-03-12 DIAGNOSIS — I10 Essential (primary) hypertension: Secondary | ICD-10-CM | POA: Diagnosis not present

## 2023-03-18 ENCOUNTER — Encounter: Payer: Self-pay | Admitting: Psychiatry

## 2023-03-18 ENCOUNTER — Ambulatory Visit: Payer: Medicare HMO | Admitting: Psychiatry

## 2023-03-18 VITALS — BP 136/82 | HR 90 | Temp 97.5°F | Ht 61.0 in | Wt 160.2 lb

## 2023-03-18 DIAGNOSIS — F419 Anxiety disorder, unspecified: Secondary | ICD-10-CM | POA: Diagnosis not present

## 2023-03-18 DIAGNOSIS — F32A Depression, unspecified: Secondary | ICD-10-CM | POA: Diagnosis not present

## 2023-03-18 MED ORDER — LAMOTRIGINE 25 MG PO TABS
25.0000 mg | ORAL_TABLET | Freq: Every day | ORAL | 1 refills | Status: DC
Start: 2023-03-18 — End: 2023-09-23

## 2023-03-18 MED ORDER — HYDROXYZINE HCL 25 MG PO TABS: 12.5000 mg | ORAL_TABLET | Freq: Two times a day (BID) | ORAL | 1 refills | Status: DC | PRN

## 2023-03-18 NOTE — Progress Notes (Unsigned)
error 

## 2023-03-18 NOTE — Patient Instructions (Addendum)
[email or web address]  [email or web address]  [email or web address] [facility] [address], [place], [place] [address]   [phone]  [facility] [email or web address] [address], [place], [place] [address]  [phone]  [facility] [email or web address] [address], [place], [place] [address]  [phone]   [facility] - [phone]  [facility] - [phone]  [facility] - [phone] counseling [phone]  [facility] [phone]   [email or web address] this clinician can offer telehealth and has a sliding scale option  [email or web address] this group also offers sliding scale rates and is based out of [place]  Dr. Liborio Nixon with the [facility] specializes in divorce  [employer] [facility] has interns who offer sliding scale rates and some of the full time clinicians do, as well. You complete their contact form on their website and the referrals coordinator will help to get connected to someone   Medicaid below :  [facility] [address] [place], [place] [address]  [phone]    [employer] [address] [place], [place] [address]  [phone]    [facility] [address] [place], [place] [address]  [phone]     Lamotrigine Tablets What is this medication? LAMOTRIGINE (la MOE Patrecia Pace) prevents and controls seizures in people with epilepsy. It may also be used to treat bipolar disorder. It works by calming overactive nerves in your body. This medicine may be used for other purposes; ask your health care provider or pharmacist if you have questions. COMMON BRAND NAME(S): Lamictal, Subvenite What  should I tell my care team before I take this medication? They need to know if you have any of these conditions: Heart disease History of irregular heartbeat Immune system problems Kidney disease Liver disease Low levels of folic acid in the blood Lupus Mental health condition Suicidal thoughts, plans, or attempt by you or a family member An unusual or allergic reaction to lamotrigine, other medications, foods, dyes, or preservatives Pregnant or trying to get pregnant Breastfeeding How should I use this medication? Take this medication by mouth with a glass of water. Follow the directions on the prescription label. Do not chew these tablets. If this medication upsets your stomach, take it with food or milk. Take your doses at regular intervals. Do not take your medication more often than directed. A special MedGuide will be given to you by the pharmacist with each new prescription and refill. Be sure to read this information carefully each time. Talk to your care team about the use of this medication in children. While this medication may be prescribed for children as young as 2 years for selected conditions, precautions do apply. Overdosage: If you think you have taken too much of this medicine contact a poison control center or emergency room at once. NOTE: This medicine is only for you. Do not share this medicine with others. What if I miss a dose? If you miss a dose, take it as soon as you can. If it is almost time for your next dose, take only that dose. Do not take double or extra doses. What may interact with this medication? Atazanavir Certain medications for irregular heartbeat Certain medications for seizures, such as carbamazepine, phenobarbital, phenytoin, primidone, or valproic acid Estrogen or progestin hormones Lopinavir Rifampin Ritonavir This list may not describe  all possible interactions. Give your health care provider a list of all the medicines, herbs,  non-prescription drugs, or dietary supplements you use. Also tell them if you smoke, drink alcohol, or use illegal drugs. Some items may interact with your medicine. What should I watch for while using this medication? Visit your care team for regular checks on your progress. If you take this medication for seizures, wear a Medic Alert bracelet or necklace. Carry an identification card with information about your condition, medications, and care team. It is important to take this medication exactly as directed. When first starting treatment, your dose will need to be adjusted slowly. It may take weeks or months before your dose is stable. You should contact your care team if your seizures get worse or if you have any new types of seizures. Do not stop taking this medication unless instructed by your care team. Stopping your medication suddenly can increase your seizures or their severity. This medication may cause serious skin reactions. They can happen weeks to months after starting the medication. Contact your care team right away if you notice fevers or flu-like symptoms with a rash. The rash may be red or purple and then turn into blisters or peeling of the skin. You may also notice a red rash with swelling of the face, lips, or lymph nodes in your neck or under your arms. This medication may affect your coordination, reaction time, or judgment. Do not drive or operate machinery until you know how this medication affects you. Sit up or stand slowly to reduce the risk of dizzy or fainting spells. Drinking alcohol with this medication can increase the risk of these side effects. If you are taking this medication for bipolar disorder, it is important to report any changes in your mood to your care team. If your condition gets worse, you get mentally depressed, feel very hyperactive or manic, have difficulty sleeping, or have thoughts of hurting yourself or committing suicide, you need to get help from your  care team right away. If you are a caregiver for someone taking this medication for bipolar disorder, you should also report these behavioral changes right away. The use of this medication may increase the chance of suicidal thoughts or actions. Pay special attention to how you are responding while on this medication. Your mouth may get dry. Chewing sugarless gum or sucking hard candy and drinking plenty of water may help. Contact your care team if the problem does not go away or is severe. If you become pregnant while using this medication, you may enroll in the [place] American Antiepileptic Drug Pregnancy Registry by calling [phone]. This registry collects information about the safety of antiepileptic medication use during pregnancy. This medication may cause a decrease in folic acid. You should make sure that you get enough folic acid while you are taking this medication. Discuss the foods you eat and the vitamins you take with your care team. What side effects may I notice from receiving this medication? Side effects that you should report to your care team as soon as possible: Allergic reactions--skin rash, itching, hives, swelling of the face, lips, tongue, or throat Change in vision Fever, neck pain or stiffness, sensitivity to light, headache, nausea, vomiting, confusion Heart rhythm changes--fast or irregular heartbeat, dizziness, feeling faint or lightheaded, chest pain, trouble breathing Infection--fever, chills, cough, or sore throat Liver injury--right upper belly pain, loss of appetite, nausea, light-colored stool, dark yellow or brown urine, yellowing skin or eyes, unusual weakness or  fatigue Low red blood cell count--unusual weakness or fatigue, dizziness, headache, trouble breathing Rash, fever, and swollen lymph nodes Redness, blistering, peeling or loosening of the skin, including inside the mouth Thoughts of suicide or self-harm, worsening mood, or feelings of  depression Unusual bruising or bleeding Side effects that usually do not require medical attention (report to your care team if they continue or are bothersome): Diarrhea Dizziness Drowsiness Headache Nausea Stomach pain Tremors or shaking This list may not describe all possible side effects. Call your doctor for medical advice about side effects. You may report side effects to FDA at 1-800-FDA-1088. Where should I keep my medication? Keep out of the reach of children and pets. Store at [employer] [facility] (77 degrees F). Protect from light. Get rid of any unused medication after the expiration date. To get rid of medications that are no longer needed or have expired: Take the medication to a medication take-back program. Check with your pharmacy or law enforcement to find a location. If you cannot return the medication, check the label or package insert to see if the medication should be thrown out in the garbage or flushed down the toilet. If you are not sure, ask your care team. If it is safe to put it in the trash, empty the medication out of the container. Mix the medication with cat litter, dirt, coffee grounds, or other unwanted substance. Seal the mixture in a bag or container. Put it in the trash. NOTE: This sheet is a summary. It may not cover all possible information. If you have questions about this medicine, talk to your doctor, pharmacist, or health care provider.  2024 Elsevier/Gold Standard (2021-12-05 00:00:00)

## 2023-03-18 NOTE — Progress Notes (Unsigned)
Psychiatric Initial Adult Assessment   Patient Identification: Ashley [name] MRN:  [id number] Date of Evaluation:  03/18/2023 Referral Source: Dr.Ravishankar Avva Chief Complaint:   Chief Complaint  Patient presents with   Establish Care   Anxiety   Depression   Visit Diagnosis:    ICD-10-CM   1. Depression, unspecified depression type  F32.A lamoTRIgine (LAMICTAL) 25 MG tablet    2. Anxiety disorder, unspecified type  F41.9 lamoTRIgine (LAMICTAL) 25 MG tablet    hydrOXYzine (ATARAX) 25 MG tablet      History of Present Illness:  Ashley [name] is a 57 year old Caucasian female, widowed, has a history of COPD, gastroparesis, GERD, hypertension, hyperlipidemia, COVID-19 bilateral pneumonia in 02/11/22status post intubation, on mechanical life-support for 8 days at that time, hypothyroidism, diabetes melitis on insulin pump, vitamin D deficiency, was evaluated in office today, presented to establish care.  Patient reports she started having depression and anxiety symptoms after the death of her husband in July 22, 2021.  He died immediately due to health problems.  Patient reports they were together for 43 years.  Hence it has been very hard for her to move on.  She reports she mostly struggles with a lot of anxiety.  She reports she is unable to get out and go to a grocery store, her anxiety gets worse in public situations.  She hence tries to stay home a lot.  She reports she often feels overwhelmed with her anxiety.  She however does have good support system from her mother-in-law who comes in and checks in on her and that has been very helpful.  She also has a son who is supportive.  Patient reports depression symptoms like sadness, excessive sleepiness during the day, lack of sleep at night, anhedonia, decreased appetite.  She reports she is on insulin pump however does not eat much during the day.  She has to force herself to eat.  She also reports she used to go walking  previously however does not do it much anymore.  Sleep is interrupted throughout the night.  Then she spends her time during the day mostly sleeping.  She reports her grandson and step grand daughter spends time with her every other Friday night.  Patient reports that has been extremely helpful since that keeps her busy and helps her to keep herself distracted from her grief.  Patient denies any mania or hypomanic symptoms.  Patient denies any obsessions or compulsive behaviors.  Patient denies any history of sexual or physical or emotional trauma.  Patient reports she has tried multiple antidepressants in the past including BuSpar which did not work, Cymbalta which was prescribed for neuropathy, did not help.  She is currently on sertraline 200 mg which may have initially helped however no longer helps.  She stays anxious throughout the day even while taking the sertraline.  Patient reports she was prescribed Seroquel however she took it only for a short period since she started having blackouts.  There was a time when she fell down and had blackouts couple of times and woke up in a pool of blood since she is on Plavix.  She had stopped taking the Seroquel.  Patient reports she is prescribed Xanax and she likely took the Xanax along with the Seroquel.  Currently she takes 1-1/2 tablets Xanax every night hoping that it will help her to sleep however it does not anymore.  She uses 1/2 tablet of Xanax during the day as needed.  Patient reports she  is planning to talk to her primary care provider when she goes in for her appointment soon regarding her recent blackouts.  Patient currently denies any suicidality, homicidality or perceptual disturbances.  Patient denies any other concerns today.   Associated Signs/Symptoms: Depression Symptoms:  depressed mood, anhedonia, insomnia, feelings of worthlessness/guilt, difficulty concentrating, anxiety, decreased appetite, (Hypo) Manic Symptoms:    Denies Anxiety Symptoms:  Excessive Worry, Social Anxiety, Psychotic Symptoms:   Denies PTSD Symptoms: Negative  Past Psychiatric History: Patient denies inpatient behavioral health admissions.  Patient was under the care of her primary care provider who was managing her mood symptoms.  Denies suicide attempts.  Previous Psychotropic Medications: Yes BuSpar-did not work, Cymbalta-prescribed for neuropathy-did not help with her mood symptoms, Seroquel-blackouts.  Substance Abuse History in the last 12 months:  No.  Consequences of Substance Abuse: Negative  Past Medical History:  Past Medical History:  Diagnosis Date   Anxiety    COPD (chronic obstructive pulmonary disease) (HCC)    Depression    DOE (dyspnea on exertion)    Dyspnea    Gastroparesis    GERD (gastroesophageal reflux disease)    Glaucoma, both eyes    History of 2019 novel coronavirus disease (COVID-19) 06/20/2020   hospital admission-- dx severe covid with pneumonia, ARDS, DKA, COPD exaberation;  pt intubated 01-13th and extubated 01-20th;   (11-15-2020  per pt did not go home on oxygen, residual generalized weakness and hair loss)   History of esophageal stricture    post dilatation, followed by dr Marina Goodell   History of Graves' disease    s/p RAI  03-03-2010   History of Helicobacter pylori infection    remote hx and treatment   History of kidney stones    Hyperlipidemia    Hypertension    followed by pcp   Hypothyroidism, postradioiodine therapy 02/2010   followed by pcp   IDA (iron deficiency anemia)    Insulin pump in place    w/ Novolog , managed by pcp   Moderate asthma    followed by pcp,  (11-15-2020  per pt has pulmonology appt on 11-17-2020))   OSA (obstructive sleep apnea)    11-15-2020  per pt has not used cpap since 05/ 2021  (previous followed by dr Vickey Huger,  study in epic 05-12-2018 moderate complex osa pt had used for awhile)   Osteoporosis    PAD (peripheral artery disease) (HCC)     followed by dr berry----  s/p bilateral common iliac artery angioplasty stenting for stenosis   Peripheral neuropathy    Pneumonia    covid 2022   PONV (postoperative nausea and vomiting)    Rheumatoid arthritis (HCC)    in both eyes   Right wrist fracture    Type 1 diabetes mellitus with long-term current use of insulin (HCC)    per pt dx at age 35 approx,  followed by pcp,  (11-15-2020  per pt last A1c 7.2 in 04/ 2022),  checks blood sugar 6 to 10 times daily , fasting sugar-- 200 -- 300)   Vitamin D deficiency     Past Surgical History:  Procedure Laterality Date   ABDOMINAL AORTOGRAM W/LOWER EXTREMITY Bilateral 01/01/2019   Procedure: ABDOMINAL AORTOGRAM W/LOWER EXTREMITY;  Surgeon: Runell Gess, MD;  Location: [facility];  Service: Cardiovascular;  Laterality: Bilateral;   BREAST EXCISIONAL BIOPSY Left 04-*02-2000  @[facility]    per pt benign   BREAST SURGERY     CATARACT EXTRACTION W/ INTRAOCULAR LENS  IMPLANT, BILATERAL  11/2019   COLONOSCOPY  last one 2019 approx   CYSTOSCOPY WITH STENT PLACEMENT Right 07/22/2021   Procedure: CYSTOSCOPY WITH STENT PLACEMENT;  Surgeon: Sondra Come, MD;  Location: [facility];  Service: Urology;  Laterality: Right;   CYSTOSCOPY/URETEROSCOPY/HOLMIUM LASER/STENT PLACEMENT Right 08/11/2021   Procedure: CYSTOSCOPY/URETEROSCOPY/HOLMIUM LASER/STENT EXCHANGE;  Surgeon: Sondra Come, MD;  Location: [facility];  Service: Urology;  Laterality: Right;   FRACTURE SURGERY     LAPAROSCOPIC ASSISTED VAGINAL HYSTERECTOMY  06-22-2004 @[facility]    NASAL SEPTOPLASTY W/ TURBINOPLASTY Bilateral 09/07/2013   Procedure: SEPTOPLASTY, BILATERAL TURBINATE RESECTION ;  Surgeon: Darletta Moll, MD;  Location: [facility];  Service: ENT;  Laterality: Bilateral;   OPEN REDUCTION INTERNAL FIXATION (ORIF) DISTAL RADIAL FRACTURE Right 11/16/2020   Procedure: OPEN REDUCTION INTERNAL FIXATION (ORIF) DISTAL RADIAL FRACTURE;  Surgeon: Bjorn Pippin, MD;  Location: [facility]  [place];  Service: Orthopedics;  Laterality: Right;   PERIPHERAL VASCULAR INTERVENTION Bilateral 01/01/2019   Procedure: PERIPHERAL VASCULAR INTERVENTION;  Surgeon: Runell Gess, MD;  Location: [facility];  Service: Cardiovascular;  Laterality: Bilateral;   UPPER GASTROINTESTINAL ENDOSCOPY  last one 07-23-2019  dr Marina Goodell    Family Psychiatric History: As noted below-father-alcoholism.    Family History:  Family History  Problem Relation Age of Onset   Diabetes Father    Heart attack Father    Alcoholism Father    Lung cancer Mother    Liver cancer Mother    Irritable bowel syndrome Sister    Colon cancer Neg Hx    Esophageal cancer Neg Hx    Rectal cancer Neg Hx    Stomach cancer Neg Hx     Social History:   Social History   Socioeconomic History   Marital status: Widowed    Spouse name: Not on file   Number of children: 1   Years of education: Not on file   Highest education level: High school graduate  Occupational History   Not on file  Tobacco Use   Smoking status: Former    Current packs/day: 0.00    Average packs/day: 0.5 packs/day for 40.0 years (20.0 ttl pk-yrs)    Types: Cigarettes    Start date: 55    Quit date: 06/20/2020    Years since quitting: 2.7   Smokeless tobacco: Never   Tobacco comments:    Started smoking at age [age]  Vaping Use   Vaping status: Never Used  Substance and Sexual Activity   Alcohol use: No    Comment: occasional   Drug use: No   Sexual activity: Not Currently  Other Topics Concern   Not on file  Social History Narrative    Education 12th grade.  Caffeine 4-5 drinks daily.   Lives alone   Social Determinants of Health   Financial Resource Strain: Not on file  Food Insecurity: Not on file  Transportation Needs: Not on file  Physical Activity: Not on file  Stress: Not on file  Social Connections: Unknown (10/23/2021)   Received from [facility]   Social Network    Social  Network: Not on file    Additional Social History: Patient was born and raised in [place].  She was raised by both parents.  Reports she had a normal childhood.  She went up to 12th grade, used to work as a Associate Professor for several years.  Patient is currently on social security disability.  Patient reports she believes  in God.  She does have access to guns however safely locked away.  She does have good support system from her mother-in-law.  Patient is currently widowed.  They were married for 43 years.  She has a son who lives close to her and a grandson.  Patient currently lives in [place].  Allergies:   Allergies  Allergen Reactions   Mirtazapine Hives and Rash   Clindamycin Hives   Macrobid [Nitrofurantoin] Hives   Other Hives    Tylox    Sulfonamide Derivatives Hives   Alendronate Sodium Itching   Dorzolamide Other (See Comments)   Fosamax [Alendronate]     Other reaction(s): HIVES   Levocetirizine Other (See Comments)   Lipitor [Atorvastatin] Other (See Comments)    MUSCLE ACHES   Restasis [Cyclosporine]     Pt stated, "My eyes turned red on the inside and outside of eye; burning sensation"   Sulfa Antibiotics Hives   Topamax [Topiramate] Itching    Metabolic Disorder Labs: Lab Results  Component Value Date   HGBA1C 8.2 (H) 08/14/2021   MPG 189 08/14/2021   MPG 188.64 06/21/2020   No results found for: "PROLACTIN" Lab Results  Component Value Date   CHOL 112 01/09/2019   TRIG 204 (H) 06/27/2020   HDL 40 01/09/2019   CHOLHDL 2.8 01/09/2019   LDLCALC 50 01/09/2019   Lab Results  Component Value Date   TSH 18.770 (H) 07/04/2020    Therapeutic Level Labs: No results found for: "LITHIUM" No results found for: "CBMZ" No results found for: "VALPROATE"  Current Medications: Current Outpatient Medications  Medication Sig Dispense Refill   acetaminophen (TYLENOL) 500 MG tablet Take 1,000 mg by mouth every 6 (six) hours as needed for moderate pain.      albuterol (PROVENTIL) (2.5 MG/3ML) 0.083% nebulizer solution Take 3 mLs (2.5 mg total) by nebulization every 4 (four) hours as needed for wheezing or shortness of breath. 75 mL 3   albuterol (VENTOLIN HFA) 108 (90 Base) MCG/ACT inhaler Inhale into the lungs.     Albuterol Sulfate, sensor, (PROAIR DIGIHALER) 108 (90 Base) MCG/ACT AEPB Inhale 2 puffs into the lungs every 6 (six) hours as needed (shortness of breath).     ALPRAZolam (XANAX) 0.5 MG tablet Take 0.5-1 mg by mouth 3 (three) times daily as needed for anxiety.     AMBULATORY NON FORMULARY MEDICATION NOVOLOG INSULIN PUMP USES AS DIRECTED     aspirin EC 81 MG tablet Take 81 mg by mouth daily. Swallow whole.     Blood Glucose Monitoring Suppl (ACCU-CHEK AVIVA PLUS) w/Device KIT      budesonide (PULMICORT) 0.25 MG/2ML nebulizer solution Take 2 mLs (0.25 mg total) by nebulization 2 (two) times daily as needed (wheezing/dyspnea). 60 mL 1   chlorhexidine (PERIDEX) 0.12 % solution SWISH FOR 30 SEC THEN SPIT, USE TWICE DAILY     clopidogrel (PLAVIX) 75 MG tablet Take by mouth.     fluconazole (DIFLUCAN) 150 MG tablet Take 150 mg by mouth every other day.     Fluocinolone Acetonide 0.18 MG IMPL by Intravitreal route.     folic acid (FOLVITE) 1 MG tablet Take 1 tablet by mouth daily.     hydrOXYzine (ATARAX) 25 MG tablet Take 0.5-1 tablets (12.5-25 mg total) by mouth 2 (two) times daily as needed. For anxiety 60 tablet 1   Insulin Human (INSULIN PUMP) SOLN Inject into the skin. Novolog----  basal rate--- 12 am - 2 am = 1.3, 2 am - 6 am =  1.4, 6 am - 3 pm = 1.8, 3 pm - 12 am = 1.55     lamoTRIgine (LAMICTAL) 25 MG tablet Take 1 tablet (25 mg total) by mouth daily. 30 tablet 1   lansoprazole (PREVACID) 30 MG capsule Take 1 capsule (30 mg total) by mouth 2 (two) times daily before a meal. 180 capsule 1   lansoprazole (PREVACID) 30 MG capsule Take by mouth.     latanoprost (XALATAN) 0.005 % ophthalmic solution Apply to eye.     levothyroxine  (SYNTHROID) 125 MCG tablet Take 125 mcg by mouth daily before breakfast.     lidocaine (XYLOCAINE) 2 % solution SMARTSIG:15 Milliliter(s) By Mouth 3 Times Daily PRN     methotrexate (RHEUMATREX) 2.5 MG tablet Take 6 tablets (15 mg total) by mouth once a week. 3 tablets am and at bedtime twice a day once a week,  Saturday's (Patient taking differently: Take 25 mg by mouth once a week. 5 tablets am and at bedtime twice a day once a week,  Saturday's)     metoprolol succinate (TOPROL-XL) 50 MG 24 hr tablet Take 50 mg by mouth daily. Take with or immediately following a meal.     montelukast (SINGULAIR) 10 MG tablet Take 10 mg by mouth at bedtime.     NOVOLOG 100 UNIT/ML injection Inject into the skin See admin instructions. Pt has Insulin pump     pregabalin (LYRICA) 75 MG capsule Take 75-150 mg by mouth See admin instructions. Take 75 mg in the morning, 75 mg at lunch, and 150 mg at bedtime     promethazine (PHENERGAN) 25 MG tablet SMARTSIG:1 Tablet(s) By Mouth Every 12 Hours PRN     rosuvastatin (CRESTOR) 40 MG tablet Take 40 mg by mouth daily.     sertraline (ZOLOFT) 100 MG tablet 2 po qd     triamcinolone cream (KENALOG) 0.1 % 1 APPLICATION EXTERNALLY THREE TIMES A DAY AS NEEDED     Vitamin D, Ergocalciferol, (DRISDOL) 50000 UNITS CAPS Take 50,000 Units by mouth every Saturday.     ACCU-CHEK COMPACT PLUS test strip 1 each by Other route as needed.      ACCU-CHEK SOFTCLIX LANCETS lancets      No current facility-administered medications for this visit.    Musculoskeletal: Strength & Muscle Tone: within normal limits Gait & Station: normal Patient leans: N/A  Psychiatric Specialty Exam: Review of Systems  Psychiatric/Behavioral:  Positive for decreased concentration, dysphoric mood and sleep disturbance. The patient is nervous/anxious.     Blood pressure 136/82, pulse 90, temperature (!) 97.5 F (36.4 C), temperature source Skin, height 5\' 1"  (1.549 m), weight 160 lb 3.2 oz (72.7 kg).Body  mass index is 30.27 kg/m.  General Appearance: Fairly Groomed  Eye Contact:  Fair  Speech:  Clear and Coherent  Volume:  Normal  Mood:  Anxious and Depressed  Affect:  Congruent  Thought Process:  Goal Directed and Descriptions of Associations: Intact  Orientation:  Full (Time, Place, and Person)  Thought Content:  Logical  Suicidal Thoughts:  No  Homicidal Thoughts:  No  Memory:  Immediate;   Fair Recent;   Fair Remote;   Poor  Judgement:  Fair  Insight:  Fair  Psychomotor Activity:  Normal  Concentration:  Concentration: Fair and Attention Span: Fair  Recall:  [employer] of Knowledge:Fair  Language: Fair  Akathisia:  No  Handed:  Right  AIMS (if indicated):  done  Assets:  Communication Skills Desire for Improvement Housing Social  Support  ADL's:  Intact  Cognition: WNL  Sleep:  Poor   Screenings: GAD-7    Flowsheet Row Office Visit from [facility]/18/2023 in [facility]  Total GAD-7 Score 11      PHQ2-9    Flowsheet Row Office Visit from 03/18/2023 in [facility]  PHQ-2 Total Score 6  PHQ-9 Total Score 24      Flowsheet Row Office Visit from 03/18/2023 in [facility] ED from 03/04/2022 in [facility] ED to Hosp-Admission (Discharged) from 08/13/2021 in [facility]  C-SSRS RISK CATEGORY No Risk No Risk No Risk       Assessment and Plan: Ashley [name] is a 57 year old Caucasian female, widowed, has a history of multiple medical problems, worsening depression and anxiety symptoms unspecified, recent psychosocial stressors including the death of her spouse, will benefit from medication management, referral to psychotherapy sessions, plan as noted below.   The patient demonstrates the following risk factors for suicide: Chronic risk factors for suicide include:  psychiatric disorder of anxiety, depression, medical illness multiple, and chronic pain. Acute risk factors for suicide include: loss (financial, interpersonal, professional). Protective factors for this patient include: positive social support, positive therapeutic relationship, responsibility to others (children, family), coping skills, hope for the future, and religious beliefs against suicide. Considering these factors, the overall suicide risk at this point appears to be low. Patient is appropriate for outpatient follow up.  Plan  Depression unspecified-unstable Continue sertraline 200 mg p.o. daily Start Lamictal 25 mg p.o. daily with plan to increase the dosage gradually. Provided medication education, discussed side effects including the risk of Stevens-Johnson syndrome.  Anxiety disorder unspecified-unstable Start hydroxyzine 12.5-25 mg twice a day as needed for severe anxiety attacks. Patient currently taking Xanax 0.25 mg during the day as needed and 0.75 mg at bedtime.  Patient advised to reduce the nighttime dosage to 0.5 mg until her next appointment.  Patient's Xanax is currently being prescribed by primary care provider.  Provided education about benzodiazepine use, long-term plan is to taper it off.  Patient with recent blackouts advised to follow up with primary care provider. Patient will benefit from psychotherapy sessions, I have provided resources in the community as well as communicated with staff to place this patient on a wait list for our incoming therapist.  Patient will benefit from [facility] labs, however reports it was recently completed.  Patient to sign an ROI to obtain it from primary care provider.  Collaboration of Care: Referral or follow-up with counselor/therapist AEB patient encouraged to schedule an appointment with therapist.  Patient/Guardian was advised Release of Information must be obtained prior to any record release in order to collaborate their care with an  outside provider. Patient/Guardian was advised if they have not already done so to contact the registration department to sign all necessary forms in order for [place] to release information regarding their care.   Consent: Patient/Guardian gives verbal consent for treatment and assignment of benefits for services provided during this visit. Patient/Guardian expressed understanding and agreed to proceed.  Follow-up in clinic in 3 to 4 weeks or sooner if needed.  This note was generated in part or whole with voice recognition software. Voice recognition is usually quite accurate but there are transcription errors that can and very often do occur. I apologize for any typographical errors that were not detected and corrected.    Ashley [name]  Elna Breslow, MD 10/8/20245:47 PM

## 2023-03-27 DIAGNOSIS — M810 Age-related osteoporosis without current pathological fracture: Secondary | ICD-10-CM | POA: Diagnosis not present

## 2023-03-27 DIAGNOSIS — I739 Peripheral vascular disease, unspecified: Secondary | ICD-10-CM | POA: Diagnosis not present

## 2023-03-27 DIAGNOSIS — F418 Other specified anxiety disorders: Secondary | ICD-10-CM | POA: Diagnosis not present

## 2023-03-27 DIAGNOSIS — E785 Hyperlipidemia, unspecified: Secondary | ICD-10-CM | POA: Diagnosis not present

## 2023-03-27 DIAGNOSIS — E1040 Type 1 diabetes mellitus with diabetic neuropathy, unspecified: Secondary | ICD-10-CM | POA: Diagnosis not present

## 2023-03-27 DIAGNOSIS — K3184 Gastroparesis: Secondary | ICD-10-CM | POA: Diagnosis not present

## 2023-03-27 DIAGNOSIS — G4733 Obstructive sleep apnea (adult) (pediatric): Secondary | ICD-10-CM | POA: Diagnosis not present

## 2023-03-27 DIAGNOSIS — J439 Emphysema, unspecified: Secondary | ICD-10-CM | POA: Diagnosis not present

## 2023-03-27 DIAGNOSIS — I10 Essential (primary) hypertension: Secondary | ICD-10-CM | POA: Diagnosis not present

## 2023-03-28 ENCOUNTER — Ambulatory Visit (HOSPITAL_COMMUNITY)
Admission: RE | Admit: 2023-03-28 | Discharge: 2023-03-28 | Disposition: A | Discharge status: Home / Self Care | Payer: Medicare HMO | Source: Ambulatory Visit | Attending: Cardiology | Admitting: Cardiology

## 2023-03-28 ENCOUNTER — Ambulatory Visit (HOSPITAL_BASED_OUTPATIENT_CLINIC_OR_DEPARTMENT_OTHER)
Admission: RE | Admit: 2023-03-28 | Discharge: 2023-03-28 | Disposition: A | Discharge status: Home / Self Care | Payer: Medicare HMO | Source: Ambulatory Visit | Attending: Cardiology | Admitting: Cardiology

## 2023-03-28 DIAGNOSIS — I739 Peripheral vascular disease, unspecified: Secondary | ICD-10-CM | POA: Diagnosis not present

## 2023-03-28 DIAGNOSIS — Z95820 Peripheral vascular angioplasty status with implants and grafts: Secondary | ICD-10-CM | POA: Diagnosis not present

## 2023-03-28 LAB — VAS US ABI WITH/WO TBI
Left ABI: 1.09
Right ABI: 1.33

## 2023-04-01 DIAGNOSIS — I10 Essential (primary) hypertension: Secondary | ICD-10-CM | POA: Diagnosis not present

## 2023-04-01 DIAGNOSIS — E559 Vitamin D deficiency, unspecified: Secondary | ICD-10-CM | POA: Diagnosis not present

## 2023-04-01 DIAGNOSIS — M810 Age-related osteoporosis without current pathological fracture: Secondary | ICD-10-CM | POA: Diagnosis not present

## 2023-04-03 ENCOUNTER — Telehealth: Payer: Self-pay

## 2023-04-03 ENCOUNTER — Encounter: Payer: Self-pay | Admitting: Cardiovascular Disease

## 2023-04-03 ENCOUNTER — Ambulatory Visit: Payer: Medicare HMO | Attending: Cardiovascular Disease | Admitting: Cardiovascular Disease

## 2023-04-03 VITALS — BP 140/80 | HR 79 | Resp 16 | Ht 61.0 in | Wt 162.6 lb

## 2023-04-03 DIAGNOSIS — I739 Peripheral vascular disease, unspecified: Secondary | ICD-10-CM | POA: Diagnosis not present

## 2023-04-03 DIAGNOSIS — I10 Essential (primary) hypertension: Secondary | ICD-10-CM | POA: Diagnosis not present

## 2023-04-03 DIAGNOSIS — E782 Mixed hyperlipidemia: Secondary | ICD-10-CM | POA: Diagnosis not present

## 2023-04-03 NOTE — Telephone Encounter (Signed)
received a message to call kim back at [employer] disabilty for this patient. claim # [id number].

## 2023-04-03 NOTE — Progress Notes (Signed)
04/03/2023 Ashley [name]   09-09-1965  [id number]  Primary Physician Chilton Greathouse, MD Primary Cardiologist: Runell Gess MD Nicholes Calamity, [place]  HPI:  Ashley [name] is a 57 y.o.  moderately overweight married Caucasian female mother of 1 son, grandmother to one grandchild referred by Dr. Charlsie Merles for peripheral vascular valuation because of poorly palpable pulses and leg pain.  I last saw her in the office 12/19/2021.  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  She has a history of  treated hypertension, diabetes and hyperlipidemia.  Her father died of a myocardial infarction at age 26.  She is never had a heart attack or stroke and denies chest pain or shortness of breath.  She has been on insulin pump for last 26 years.  She has had bilateral lower extremity lifestyle and claudication for last 2 years prior to her intervention treated as orthopedic or neurovascular pain, with recent lower extremity Dopplers performed in our office 12/16/2018 revealing a right ABI 0.64 and a left 0.55.  She had high-grade bilateral iliac disease.   I performed peripheral angiography on her 01/01/2019 revealing high-grade distal abdominal aortic stenosis.  I performed bilateral PTA and covered stenting using "kissing stent technique and rebuilding her aortic bifurcation.  Her follow-up Dopplers normalized and her claudication has resolved.  She does have some swelling in her lower extremities potentially related to the increased blood flow as result of her intervention.  I am going to put her on low-dose diuretic and decrease her amlodipine dose.  She remains on dual antiplatelet therapy.     She was hospitalized in 2022/07/24 of this year with COVID-pneumonia and was on the ventilator for 9 days.  She has since stopped smoking.  She is also gained close to 20 pounds for unclear reasons.  She has complained of some back and left leg pain however lower extremity  arterial Doppler studies have remained fairly stable.  Since I saw her a year ago her husband of 42 years unfortunately died in 02/13/23of a myocardial infarction.  She complains of atypical chest pain on a daily basis which she attributes to anxiety.  She really never had this prior to his passing.  She also complains of some hip and back discomfort although her Doppler studies have remained stable.  She stopped smoking when she got COVID several years ago.  She is still grieving the loss of her husband but enjoys seeing her grandchild on the weekends.   Current Meds  Medication Sig   ACCU-CHEK COMPACT PLUS test strip 1 each by Other route as needed.    ACCU-CHEK SOFTCLIX LANCETS lancets    acetaminophen (TYLENOL) 500 MG tablet Take 1,000 mg by mouth every 6 (six) hours as needed for moderate pain.   albuterol (PROVENTIL) (2.5 MG/3ML) 0.083% nebulizer solution Take 3 mLs (2.5 mg total) by nebulization every 4 (four) hours as needed for wheezing or shortness of breath.   albuterol (VENTOLIN HFA) 108 (90 Base) MCG/ACT inhaler Inhale into the lungs.   Albuterol Sulfate, sensor, (PROAIR DIGIHALER) 108 (90 Base) MCG/ACT AEPB Inhale 2 puffs into the lungs every 6 (six) hours as needed (shortness of breath).   ALPRAZolam (XANAX) 0.5 MG tablet Take 0.5-1 mg by mouth 3 (three) times daily as needed for anxiety.   AMBULATORY NON FORMULARY MEDICATION NOVOLOG INSULIN PUMP USES AS DIRECTED   aspirin EC 81 MG tablet Take 81  mg by mouth daily. Swallow whole.   Blood Glucose Monitoring Suppl (ACCU-CHEK AVIVA PLUS) w/Device KIT    clopidogrel (PLAVIX) 75 MG tablet Take by mouth.   fluconazole (DIFLUCAN) 150 MG tablet Take 150 mg by mouth every other day.   Fluocinolone Acetonide 0.18 MG IMPL by Intravitreal route.   folic acid (FOLVITE) 1 MG tablet Take 1 tablet by mouth daily.   hydrOXYzine (ATARAX) 25 MG tablet Take 0.5-1 tablets (12.5-25 mg total) by mouth 2 (two) times daily as needed. For anxiety    Insulin Human (INSULIN PUMP) SOLN Inject into the skin. Novolog----  basal rate--- 12 am - 2 am = 1.3, 2 am - 6 am = 1.4, 6 am - 3 pm = 1.8, 3 pm - 12 am = 1.55   lamoTRIgine (LAMICTAL) 25 MG tablet Take 1 tablet (25 mg total) by mouth daily.   lansoprazole (PREVACID) 30 MG capsule Take 1 capsule (30 mg total) by mouth 2 (two) times daily before a meal.   lansoprazole (PREVACID) 30 MG capsule Take by mouth.   latanoprost (XALATAN) 0.005 % ophthalmic solution Apply to eye.   levothyroxine (SYNTHROID) 125 MCG tablet Take 125 mcg by mouth daily before breakfast.   lidocaine (XYLOCAINE) 2 % solution SMARTSIG:15 Milliliter(s) By Mouth 3 Times Daily PRN   methotrexate (RHEUMATREX) 2.5 MG tablet Take 6 tablets (15 mg total) by mouth once a week. 3 tablets am and at bedtime twice a day once a week,  Saturday's (Patient taking differently: Take 25 mg by mouth once a week. 5 tablets am and at bedtime twice a day once a week,  Saturday's)   metoprolol succinate (TOPROL-XL) 50 MG 24 hr tablet Take 50 mg by mouth daily. Take with or immediately following a meal.   montelukast (SINGULAIR) 10 MG tablet Take 10 mg by mouth at bedtime.   NOVOLOG 100 UNIT/ML injection Inject into the skin See admin instructions. Pt has Insulin pump   pregabalin (LYRICA) 75 MG capsule Take 75-150 mg by mouth See admin instructions. Take 75 mg in the morning, 75 mg at lunch, and 150 mg at bedtime   promethazine (PHENERGAN) 25 MG tablet SMARTSIG:1 Tablet(s) By Mouth Every 12 Hours PRN   rosuvastatin (CRESTOR) 40 MG tablet Take 40 mg by mouth daily.   sertraline (ZOLOFT) 100 MG tablet 2 po qd   Vitamin D, Ergocalciferol, (DRISDOL) 50000 UNITS CAPS Take 50,000 Units by mouth every Saturday.     Allergies  Allergen Reactions   Mirtazapine Hives and Rash   Clindamycin Hives   Macrobid [Nitrofurantoin] Hives   Other Hives    Tylox    Sulfonamide Derivatives Hives   Alendronate Sodium Itching   Dorzolamide Other (See Comments)    Fosamax [Alendronate]     Other reaction(s): HIVES   Levocetirizine Other (See Comments)   Lipitor [Atorvastatin] Other (See Comments)    MUSCLE ACHES   Restasis [Cyclosporine]     Pt stated, "My eyes turned red on the inside and outside of eye; burning sensation"   Sulfa Antibiotics Hives   Topamax [Topiramate] Itching    Social History   Socioeconomic History   Marital status: Widowed    Spouse name: Not on file   Number of children: 1   Years of education: Not on file   Highest education level: High school graduate  Occupational History   Not on file  Tobacco Use   Smoking status: Former    Current packs/day: 0.00    Average packs/day:  0.5 packs/day for 40.0 years (20.0 ttl pk-yrs)    Types: Cigarettes    Start date: 26    Quit date: 06/20/2020    Years since quitting: 2.7   Smokeless tobacco: Never   Tobacco comments:    Started smoking at age [age]  Vaping Use   Vaping status: Never Used  Substance and Sexual Activity   Alcohol use: No    Comment: occasional   Drug use: No   Sexual activity: Not Currently  Other Topics Concern   Not on file  Social History Narrative    Education 12th grade.  Caffeine 4-5 drinks daily.   Lives alone   Social Determinants of Health   Financial Resource Strain: Not on file  Food Insecurity: Not on file  Transportation Needs: Not on file  Physical Activity: Not on file  Stress: Not on file  Social Connections: Unknown (10/23/2021)   Received from [facility], [facility]   Social Network    Social Network: Not on file  Intimate Partner Violence: Unknown (09/14/2021)   Received from [facility], [facility]   HITS    Physically Hurt: Not on file    Insult or Talk Down To: Not on file    Threaten Physical Harm: Not on file    Scream or Curse: Not on file     Review of Systems: General: negative for chills, fever, night sweats or weight changes.  Cardiovascular: negative for chest pain, dyspnea on exertion,  edema, orthopnea, palpitations, paroxysmal nocturnal dyspnea or shortness of breath Dermatological: negative for rash Respiratory: negative for cough or wheezing Urologic: negative for hematuria Abdominal: negative for nausea, vomiting, diarrhea, bright red blood per rectum, melena, or hematemesis Neurologic: negative for visual changes, syncope, or dizziness All other systems reviewed and are otherwise negative except as noted above.    Blood pressure (!) 148/88, pulse 79, resp. rate 16, height 5\' 1"  (1.549 m), weight 162 lb 9.6 oz (73.8 kg), SpO2 95%.  General appearance: alert and no distress Neck: no adenopathy, no carotid bruit, no JVD, supple, symmetrical, trachea midline, and thyroid not enlarged, symmetric, no tenderness/mass/nodules Lungs: clear to auscultation bilaterally Heart: regular rate and rhythm, S1, S2 normal, no murmur, click, rub or gallop Extremities: extremities normal, atraumatic, no cyanosis or edema Pulses: 2+ and symmetric Skin: Skin color, texture, turgor normal. No rashes or lesions Neurologic: Grossly normal  EKG EKG Interpretation Date/Time:  Wednesday April 03 2023 14:02:45 EDT Ventricular Rate:  79 PR Interval:  138 QRS Duration:  82 QT Interval:  384 QTC Calculation: 440 R Axis:   54  Text Interpretation: Normal sinus rhythm Possible Left atrial enlargement Nonspecific ST abnormality When compared with ECG of 13-Aug-2021 22:02, PREVIOUS ECG IS PRESENT Confirmed by Nanetta Batty [phone]) on 04/03/2023 2:13:34 PM    ASSESSMENT AND PLAN:   Essential hypertension History of essential hypertension blood pressure measured today at 148/88.  She is on metoprolol  Hyperlipidemia History of hyperlipidemia on statin therapy with lipid profile performed 11/07/2022 revealing total cholesterol of 138, LDL 73 and HDL 47.  Peripheral arterial disease (HCC) History of peripheral arterial disease status post peripheral angiography performed 01/01/2019  revealing high-grade distal abdominal aortic stenosis.  I performed bilateral PTA and covered stenting using "kissing stent technique" and rebuilding her aortic bifurcation.  Her Dopplers normalized and her claudication symptoms resolved after that.  Her most recent Doppler studies performed 03/28/2023 revealed normal ABIs bilaterally and widely patent iliac stents.     Delton See.  Allyson Sabal MD [facility], [facility] 04/03/2023 2:23 PM

## 2023-04-03 NOTE — Assessment & Plan Note (Signed)
History of peripheral arterial disease status post peripheral angiography performed 01/01/2019 revealing high-grade distal abdominal aortic stenosis.  I performed bilateral PTA and covered stenting using "kissing stent technique" and rebuilding her aortic bifurcation.  Her Dopplers normalized and her claudication symptoms resolved after that.  Her most recent Doppler studies performed 03/28/2023 revealed normal ABIs bilaterally and widely patent iliac stents.

## 2023-04-03 NOTE — Patient Instructions (Signed)
Medication Instructions:  Your physician recommends that you continue on your current medications as directed. Please refer to the Current Medication list given to you today.  *If you need a refill on your cardiac medications before your next appointment, please call your pharmacy*   Testing/Procedures: Your physician has requested that you have an Aorta/Iliac Duplex. This will be take place at [address], [address].  No food after 11PM the night before.  Water is OK. (Don't drink liquids if you have been instructed not to for ANOTHER test) Avoid foods that produce bowel gas, for 24 hours prior to exam (see below). No breakfast, no chewing gum, no smoking or carbonated beverages. Patient may take morning medications with water. Come in for test at least 15 minutes early to register. To be done in October 2025.  Your physician has requested that you have an ankle brachial index (ABI). During this test an ultrasound and blood pressure cuff are used to evaluate the arteries that supply the arms and legs with blood. Allow thirty minutes for this exam. There are no restrictions or special instructions. This will take place at [address], [address].  To be done in October 2025.     Follow-Up: At [facility], you and your health needs are our priority.  As part of our continuing mission to provide you with exceptional heart care, we have created designated Provider Care Teams.  These Care Teams include your primary Cardiologist (physician) and Advanced Practice Providers (APPs -  Physician Assistants and Nurse Practitioners) who all work together to provide you with the care you need, when you need it.  We recommend signing up for the patient portal called "MyChart".  Sign up information is provided on this After Visit Summary.  MyChart is used to connect with patients for Virtual Visits (Telemedicine).  Patients are able to view lab/test results, encounter notes, upcoming  appointments, etc.  Non-urgent messages can be sent to your provider as well.   To learn more about what you can do with MyChart, go to [email or web address].    Your next appointment:   12 month(s)  Provider:   Nanetta Batty, MD

## 2023-04-03 NOTE — Assessment & Plan Note (Signed)
History of essential hypertension blood pressure measured today at 148/88.  She is on metoprolol

## 2023-04-03 NOTE — Assessment & Plan Note (Signed)
History of hyperlipidemia on statin therapy with lipid profile performed 11/07/2022 revealing total cholesterol of 138, LDL 73 and HDL 47.

## 2023-04-03 NOTE — Telephone Encounter (Signed)
called [employer] back. notified them that Dr. Elna Breslow did not request patient for disability that we normally will not do any disability until pt has been establish for at least 6 months.   The representative did request medical records.  I told him that before medical information can be given we would have to have a release of information signed by patient and sent to our office.  The representative understood and documented information.

## 2023-04-04 ENCOUNTER — Telehealth: Payer: Self-pay | Admitting: Cardiovascular Disease

## 2023-04-04 NOTE — Telephone Encounter (Signed)
FMLA paperwork received [employer] (04-03-23). Paperwork left in Dr. Hazle Coca mailbox.  JB, 04-04-23

## 2023-04-05 DIAGNOSIS — Z1152 Encounter for screening for COVID-19: Secondary | ICD-10-CM | POA: Diagnosis not present

## 2023-04-05 DIAGNOSIS — H6691 Otitis media, unspecified, right ear: Secondary | ICD-10-CM | POA: Diagnosis not present

## 2023-04-05 DIAGNOSIS — J029 Acute pharyngitis, unspecified: Secondary | ICD-10-CM | POA: Diagnosis not present

## 2023-04-05 DIAGNOSIS — E1040 Type 1 diabetes mellitus with diabetic neuropathy, unspecified: Secondary | ICD-10-CM | POA: Diagnosis not present

## 2023-04-05 DIAGNOSIS — J439 Emphysema, unspecified: Secondary | ICD-10-CM | POA: Diagnosis not present

## 2023-04-05 DIAGNOSIS — I10 Essential (primary) hypertension: Secondary | ICD-10-CM | POA: Diagnosis not present

## 2023-04-05 DIAGNOSIS — H9201 Otalgia, right ear: Secondary | ICD-10-CM | POA: Diagnosis not present

## 2023-04-05 DIAGNOSIS — R051 Acute cough: Secondary | ICD-10-CM | POA: Diagnosis not present

## 2023-04-09 ENCOUNTER — Telehealth: Payer: Self-pay | Admitting: Psychiatry

## 2023-04-09 DIAGNOSIS — E1040 Type 1 diabetes mellitus with diabetic neuropathy, unspecified: Secondary | ICD-10-CM | POA: Diagnosis not present

## 2023-04-09 NOTE — Telephone Encounter (Signed)
Lea from [employer] has called, [phone] patient. Wanting to verify dates of service.  Forms are in media tab in her chart

## 2023-04-10 ENCOUNTER — Ambulatory Visit: Payer: Medicare HMO | Admitting: Psychiatry

## 2023-04-10 NOTE — Telephone Encounter (Signed)
i have already spoke with them 3 times and told them that dr. Elna Breslow has not taken pt out of work and that she had to be est. for at lease 6 months for any paperwork

## 2023-04-11 ENCOUNTER — Encounter (HOSPITAL_COMMUNITY): Payer: Medicare HMO

## 2023-04-11 NOTE — Telephone Encounter (Signed)
Yes I will not be filling out long-term disability for this patient.  I have marked it on the form as 'NO'. However they requested office visit dates, I have filled that information in the form.

## 2023-04-12 NOTE — Telephone Encounter (Signed)
Paperwork received is for long term disability. Per Dr. Allyson Sabal, he is willing to help but will not be filling out forms for long term disability.   Information form completed and forwarded to HIM to fulfill need for most recent office visit.

## 2023-04-19 ENCOUNTER — Telehealth: Payer: Self-pay | Admitting: Pulmonary Disease

## 2023-04-19 NOTE — Telephone Encounter (Signed)
Received request from [facility] for medical records from 02/13/2023 appointment.  Records faxed to [employer] fax# [phone].

## 2023-04-23 ENCOUNTER — Ambulatory Visit (INDEPENDENT_AMBULATORY_CARE_PROVIDER_SITE_OTHER): Payer: Medicare HMO | Admitting: Licensed Clinical Social Worker

## 2023-04-23 ENCOUNTER — Telehealth: Payer: Self-pay | Admitting: Cardiovascular Disease

## 2023-04-23 DIAGNOSIS — F332 Major depressive disorder, recurrent severe without psychotic features: Secondary | ICD-10-CM

## 2023-04-23 DIAGNOSIS — F4329 Adjustment disorder with other symptoms: Secondary | ICD-10-CM
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
## 2023-04-23 NOTE — Telephone Encounter (Signed)
Per Carma Lair, medical records info sent to HIM per paperwork request.  JB, 04-23-23

## 2023-04-29 DIAGNOSIS — F17200 Nicotine dependence, unspecified, uncomplicated: Secondary | ICD-10-CM | POA: Diagnosis not present

## 2023-04-29 DIAGNOSIS — E1040 Type 1 diabetes mellitus with diabetic neuropathy, unspecified: Secondary | ICD-10-CM | POA: Diagnosis not present

## 2023-04-29 DIAGNOSIS — K130 Diseases of lips: Secondary | ICD-10-CM | POA: Diagnosis not present

## 2023-04-29 DIAGNOSIS — I10 Essential (primary) hypertension: Secondary | ICD-10-CM | POA: Diagnosis not present

## 2023-05-03 ENCOUNTER — Ambulatory Visit: Payer: Medicare HMO | Admitting: Licensed Clinical Social Worker

## 2023-05-08 ENCOUNTER — Ambulatory Visit: Payer: Medicare HMO | Admitting: Psychiatry

## 2023-05-10 DIAGNOSIS — E1040 Type 1 diabetes mellitus with diabetic neuropathy, unspecified: Secondary | ICD-10-CM | POA: Diagnosis not present

## 2023-05-15 ENCOUNTER — Ambulatory Visit: Payer: Medicare HMO | Admitting: Licensed Clinical Social Worker

## 2023-05-15 DIAGNOSIS — F4329 Adjustment disorder with other symptoms: Secondary | ICD-10-CM | POA: Diagnosis not present

## 2023-05-15 DIAGNOSIS — F332 Major depressive disorder, recurrent severe without psychotic features: Secondary | ICD-10-CM

## 2023-05-15 NOTE — Progress Notes (Signed)
THERAPIST PROGRESS NOTE  Session Time: 11:04am-12:05pm  Participation Level: Active  Behavioral Response: CasualAlertEuthymic  Type of Therapy: Individual Therapy  Treatment Goals addressed: LTG: Reduce frequency, intensity, and duration of depression symptoms so that daily functioning is improved   ProgressTowards Goals: Progressing  Interventions: Supportive and Other: Grief Counseling , Motivational Interviewing   Summary: Ashley [name] is a 57 y.o. female who presents with sxs of depression and bereavement.  Patient identifies symptoms to include tearfulness, frequent memories and thoughts related to her husband's passing, low mood, negative self affect, misplaced guilt. Pt was oriented times 5. Pt was cooperative and engaged. Pt denies SI/HI/AVH.     Utilized therapeutic space to process recent life stressors.  Patient shared recent medical emergency which resulted in her being bedridden for 3.5 weeks.  Patient expressed concern her mental health medications contributed to her decline in physical wellbeing.  Patient identifies she has only taken Zoloft due to fear of medication side effects.  Was encouraged to continue to process these concerns and her upcoming psychiatric appointments.  Processed concerns and anxiety around her father-in-law's health.  Identified ways in which she can continue to support her in-laws during this difficult time.  Patient identified this is the first holiday she has participated in since her husband's passing.  Processed symptoms of grief.  Identified support system and feelings related to others expressing pressure for patient to "get over "her husband's death.  Patient demonstrated an understanding that her grief process is individualized and may extend past the expectations of what others believe grief should look like.  Discussed ways in which patient can give herself grace while grieving.  Patient reports she feels "left out "and abandoned by  those and her support system since the death of her husband.  She identified goals to clean out her husband's room and get back into a church community.  Cln utilized motivational interviewing to assess patient's readiness to change.  Patient identified she is feeling 8 out of 10 motivated to join a church community and identified which church she would like to attend for service.  Patient identified benefits of joining a church community to include exposure to grief support groups.  Suicidal/Homicidal: Nowithout intent/plan  Therapist Response: Cln. utilized active and supportive reflection to create a safe environment for patient to process recent life stressors and symptoms.  Clinician assessed for current stressors, symptoms, and safety since last session. Cln addressed the impact of patient's husband's death on current mental health and wellbeing.  Explored patient's experience of loss and grief processes.  Assisted in helping patient understand the stages of grief and giving herself grace while grieving the loss of her husband.  Plan: Return again in 1 week.  Diagnosis:Grief reaction with prolonged bereavement  Severe episode of recurrent major depressive disorder, without psychotic features (HCC)   Collaboration of Care: AEB psychiatrist can access notes and cln. Will review psychiatrists' notes. Check in with the patient and will see LCSW per availability. Patient agreed with treatment recommendations.   Patient/Guardian was advised Release of Information must be obtained prior to any record release in order to collaborate their care with an outside provider. Patient/Guardian was advised if they have not already done so to contact the registration department to sign all necessary forms in order for [place] to release information regarding their care.   Consent: Patient/Guardian gives verbal consent for treatment and assignment of benefits for services provided during this visit.  Patient/Guardian expressed understanding and agreed to proceed.  Dereck Leep, LCSW 05/15/2023

## 2023-05-17 DIAGNOSIS — E1040 Type 1 diabetes mellitus with diabetic neuropathy, unspecified: Secondary | ICD-10-CM | POA: Diagnosis not present

## 2023-05-22 DIAGNOSIS — E1040 Type 1 diabetes mellitus with diabetic neuropathy, unspecified: Secondary | ICD-10-CM | POA: Diagnosis not present

## 2023-05-23 ENCOUNTER — Ambulatory Visit (INDEPENDENT_AMBULATORY_CARE_PROVIDER_SITE_OTHER): Payer: Medicare HMO | Admitting: Licensed Clinical Social Worker

## 2023-05-23 DIAGNOSIS — Z91199 Patient's noncompliance with other medical treatment and regimen due to unspecified reason: Secondary | ICD-10-CM

## 2023-05-23 NOTE — Progress Notes (Signed)
Clinician attempted session via face-to-face, but Ashley [name] did not appear for her session. Cln. called pt. And she reports she overslept. Cln attempted to move session to virtual, but pt denied. Rescheduled patient and informed her of no show charge.

## 2023-05-27 ENCOUNTER — Ambulatory Visit (INDEPENDENT_AMBULATORY_CARE_PROVIDER_SITE_OTHER): Payer: Medicare HMO | Admitting: Licensed Clinical Social Worker

## 2023-05-27 DIAGNOSIS — F4329 Adjustment disorder with other symptoms: Secondary | ICD-10-CM | POA: Diagnosis not present

## 2023-05-27 DIAGNOSIS — F332 Major depressive disorder, recurrent severe without psychotic features: Secondary | ICD-10-CM

## 2023-05-28 NOTE — Progress Notes (Signed)
   THERAPIST PROGRESS NOTE  Session Time: 4:10-5:05pm  Participation Level: Active  Behavioral Response: CasualAlertDepressed  Type of Therapy: Individual Therapy  Treatment Goals addressed:  LTG: Reduce frequency, intensity, and duration of depression symptoms so that daily functioning is improved   ProgressTowards Goals: Progressing  Interventions: Solution Focused, Assertiveness Training, Supportive, and Other: Grief Counseling  Summary: Ashley [name] is a 57 y.o. female who presents with symptoms of depression and bereavement.  Patient identifies symptoms to include tearfulness, anhedonia, reexperiencing, low mood, negative self affect, misplaced guilt. Pt was oriented times 5. Pt was cooperative and engaged. Pt denies SI/HI/AVH.     Patient utilized therapeutic space to process symptoms of grief due to the upcoming holidays.  Patient identified Christmas was her husband's favorite holiday and she is struggling with establishing a new routine without her husband.  Patient identifies she often stays up all night and sleeps all day.  Reports she is also struggling due to the recent anniversary of her husband's birthday.  Identified how grief is impacting the relationship between her and her son.  Patient identifies she feels more distant from her son since her husband's death.  Reflected on feelings of misplaced guilt as a result of lack of energy to support others outside of her own grieving process.  Worked with clinician to reflect on current communication dynamics with her son.  Clinician gently challenged patient's current communicative styles with her son and worked with patient to identify goals for addressing healthier communication styles in the future.  Patient expressed an interest to better understand assertive communication styles has she would like to improve relationship with her son.  Suicidal/Homicidal: Nowithout intent/plan  Therapist Response: Cln utilized active and  supportive reflection to create a safe environment for patient to process recent life stressors and symptoms.  Clinician assessed for current stressors, symptoms, safety since last session.  Clinician continue to work with patient on processing the impact of her husband's death on current mental health and wellbeing.  Continue to explore patient's experience of loss and grief.  Reviewed common stages of grief and discussed ways in which grief is impacting her relationship with her son.  Addressed ways in which patient can engage in healthy communication with her son.  Plan: Return again in 2 weeks.  Diagnosis: Grief reaction with prolonged bereavement  Severe episode of recurrent major depressive disorder, without psychotic features (HCC)   Collaboration of Care: AEB psychiatrist can access notes and cln. Will review psychiatrists' notes. Check in with the patient and will see LCSW per availability. Patient agreed with treatment recommendations.  Patient/Guardian was advised Release of Information must be obtained prior to any record release in order to collaborate their care with an outside provider. Patient/Guardian was advised if they have not already done so to contact the registration department to sign all necessary forms in order for [place] to release information regarding their care.   Consent: Patient/Guardian gives verbal consent for treatment and assignment of benefits for services provided during this visit. Patient/Guardian expressed understanding and agreed to proceed.   Dereck Leep, LCSW 05/28/2023

## 2023-05-30 ENCOUNTER — Ambulatory Visit: Payer: Medicare HMO | Admitting: Licensed Clinical Social Worker

## 2023-05-31 ENCOUNTER — Telehealth: Payer: Self-pay | Admitting: Licensed Clinical Social Worker

## 2023-05-31 NOTE — Telephone Encounter (Signed)
Patient left message stating she left you some papers to fill out and give to Dr. Elna Breslow and to fax to [employer]. She is wanting to know if that is complete? I have not seen any paperwork to scan or send. Please verify

## 2023-06-09 DIAGNOSIS — E1040 Type 1 diabetes mellitus with diabetic neuropathy, unspecified: Secondary | ICD-10-CM | POA: Diagnosis not present

## 2023-06-10 ENCOUNTER — Ambulatory Visit: Payer: Medicare HMO | Admitting: Licensed Clinical Social Worker

## 2023-06-14 DIAGNOSIS — H30033 Focal chorioretinal inflammation, peripheral, bilateral: Secondary | ICD-10-CM | POA: Diagnosis not present

## 2023-06-14 DIAGNOSIS — Z79899 Other long term (current) drug therapy: Secondary | ICD-10-CM | POA: Diagnosis not present

## 2023-06-18 ENCOUNTER — Ambulatory Visit: Payer: Medicare HMO | Admitting: Licensed Clinical Social Worker

## 2023-06-18 DIAGNOSIS — F332 Major depressive disorder, recurrent severe without psychotic features: Secondary | ICD-10-CM

## 2023-06-18 DIAGNOSIS — F4329 Adjustment disorder with other symptoms: Secondary | ICD-10-CM

## 2023-06-18 NOTE — Progress Notes (Signed)
 THERAPIST PROGRESS NOTE  Virtual Visit via Video Note  I connected with Barnie LITTIE Reus on 06/18/23 at  3:00 PM EST by a video enabled telemedicine application and verified that I am speaking with the correct person using two identifiers.  Location: Patient: Address on file  Provider: [facility]   I discussed the limitations of evaluation and management by telemedicine and the availability of in person appointments. The patient expressed understanding and agreed to proceed.    I discussed the assessment and treatment plan with the patient. The patient was provided an opportunity to ask questions and all were answered. The patient agreed with the plan and demonstrated an understanding of the instructions.   The patient was advised to call back or seek an in-person evaluation if the symptoms worsen or if the condition fails to improve as anticipated.  I provided 53 minutes of non-face-to-face time during this encounter.   Evalene KATHEE Husband, LCSW   Session Time: 3:00pm-3:53pm  Participation Level: Active  Behavioral Response: CasualAlertDepressed  Type of Therapy: Individual Therapy  Treatment Goals addressed: LTG: Reduce frequency, intensity, and duration of depression symptoms so that daily functioning is improved  Goal: LTG: Increase coping skills to manage depression and improve ability to perform daily activities                        Goal: STG: Trayce will identify cognitive patterns and beliefs that support depression        ProgressTowards Goals: Progressing  Interventions: CBT, Solution Focused, and Supportive, MI  Summary: VENICE MARCUCCI is a 58 y.o. female who presents with symptoms of depression and bereavement.  Patient identifies symptoms to include tearfulness, anhedonia, re-experiencing, low mood, negative self affect, misplaced guilt. Pt reports she has not got out of bed for 4 days. Pt was oriented times 5. Pt was cooperative and engaged. Pt denies  SI/HI/AVH.   Pt reports depression sxs have worsened and shared she stopped taking Lamictal  due to what she believed was to be an allergic reaction.   Pt utilized therapeutic space to process grief and feelings brought up by the holidays. Pt reports she was sick over Christmas and missed her family gatherings. Worked with cln to identify stacked events that have transpired over the past few months to contribute to her feelings. Pt acknowledged upcoming wedding anniversary is making feelings of grief worse stating, this year [Christmas] was worse than last year.   Processed feelings about the current state of her relationship with her sister. Processed growth in her ability to refrain from engaging in aggressive communication.   Pt reports she is struggling with medication compliance stating she takes her medications only 2 times a week. Addressed ways pt can improve medication compliance such as establishing a daily routine. Pt reports she is not motivated to change her sxs stating she is 1/10 motivated to change. Discussed ways to get out of the house and improve socialization.   Discussed ways patient can cope with her grief. Pt agreed to start journaling. Discussed pt's feelings about joining a grief support group. Shared resources for [employer]. Pt expressed an interest in attending church regularly.          Suicidal/Homicidal: Nowithout intent/plan  Therapist Response: Cln utilized active and supportive reflection to create a safe environment for patient to process recent life stressors and symptoms. Clinician assessed for current stressors, symptoms, safety since last session. Worked through CBT techniques to reframe unhelpful thoughts about giving  herself grace to grieve. Discussed healthy versus unhealthy relationships.   Plan: Return again in 2 weeks.  Diagnosis:Grief reaction with prolonged bereavement  Severe episode of recurrent major depressive disorder, without psychotic features  (HCC)   Collaboration of Care: [facility] psychiatrist can access notes and cln. Will review psychiatrists' notes. Check in with the patient and will see LCSW per availability. Patient agreed with treatment recommendations.   Patient/Guardian was advised Release of Information must be obtained prior to any record release in order to collaborate their care with an outside provider. Patient/Guardian was advised if they have not already done so to contact the registration department to sign all necessary forms in order for us  to release information regarding their care.   Consent: Patient/Guardian gives verbal consent for treatment and assignment of benefits for services provided during this visit. Patient/Guardian expressed understanding and agreed to proceed.   Evalene KATHEE Husband, LCSW 06/18/2023

## 2023-07-02 ENCOUNTER — Ambulatory Visit: Payer: Medicare HMO | Admitting: Licensed Clinical Social Worker

## 2023-07-02 DIAGNOSIS — Z91199 Patient's noncompliance with other medical treatment and regimen due to unspecified reason: Secondary | ICD-10-CM

## 2023-07-02 NOTE — Progress Notes (Signed)
  Cln attempted to provide therapeutic services to patient, but patient was unable to join successfully and experienced technical difficulties. Cln attempted to resend link and problem solve. However, pt was unsuccessful in joining with use of her microphone or camera due to technical difficulties. Pt was offered the opportunity to reschedule. Pt will not be charged.

## 2023-07-09 ENCOUNTER — Ambulatory Visit: Payer: Medicare HMO | Admitting: Psychiatry

## 2023-07-10 DIAGNOSIS — E1040 Type 1 diabetes mellitus with diabetic neuropathy, unspecified: Secondary | ICD-10-CM | POA: Diagnosis not present

## 2023-07-11 ENCOUNTER — Emergency Department: Payer: Medicare HMO

## 2023-07-11 ENCOUNTER — Emergency Department
Admission: EM | Admit: 2023-07-11 | Discharge: 2023-07-11 | Disposition: A | Discharge status: Home / Self Care | Payer: Medicare HMO | Attending: Emergency Medicine | Admitting: Emergency Medicine

## 2023-07-11 ENCOUNTER — Encounter: Payer: Self-pay | Admitting: Intensive Care

## 2023-07-11 ENCOUNTER — Other Ambulatory Visit: Payer: Self-pay

## 2023-07-11 DIAGNOSIS — I10 Essential (primary) hypertension: Secondary | ICD-10-CM | POA: Diagnosis not present

## 2023-07-11 DIAGNOSIS — J321 Chronic frontal sinusitis: Secondary | ICD-10-CM | POA: Insufficient documentation

## 2023-07-11 DIAGNOSIS — J449 Chronic obstructive pulmonary disease, unspecified: Secondary | ICD-10-CM | POA: Insufficient documentation

## 2023-07-11 DIAGNOSIS — E039 Hypothyroidism, unspecified: Secondary | ICD-10-CM | POA: Diagnosis not present

## 2023-07-11 DIAGNOSIS — R9082 White matter disease, unspecified: Secondary | ICD-10-CM | POA: Diagnosis not present

## 2023-07-11 DIAGNOSIS — B974 Respiratory syncytial virus as the cause of diseases classified elsewhere: Secondary | ICD-10-CM | POA: Diagnosis not present

## 2023-07-11 DIAGNOSIS — E0500 Thyrotoxicosis with diffuse goiter without thyrotoxic crisis or storm: Secondary | ICD-10-CM | POA: Diagnosis not present

## 2023-07-11 DIAGNOSIS — R059 Cough, unspecified: Secondary | ICD-10-CM | POA: Diagnosis not present

## 2023-07-11 DIAGNOSIS — R410 Disorientation, unspecified: Secondary | ICD-10-CM | POA: Insufficient documentation

## 2023-07-11 DIAGNOSIS — B338 Other specified viral diseases: Secondary | ICD-10-CM

## 2023-07-11 DIAGNOSIS — I672 Cerebral atherosclerosis: Secondary | ICD-10-CM | POA: Insufficient documentation

## 2023-07-11 DIAGNOSIS — U071 COVID-19: Secondary | ICD-10-CM | POA: Diagnosis not present

## 2023-07-11 DIAGNOSIS — R0989 Other specified symptoms and signs involving the circulatory and respiratory systems: Secondary | ICD-10-CM | POA: Insufficient documentation

## 2023-07-11 DIAGNOSIS — R4182 Altered mental status, unspecified: Secondary | ICD-10-CM | POA: Diagnosis not present

## 2023-07-11 DIAGNOSIS — J322 Chronic ethmoidal sinusitis: Secondary | ICD-10-CM | POA: Insufficient documentation

## 2023-07-11 DIAGNOSIS — R0902 Hypoxemia: Secondary | ICD-10-CM | POA: Diagnosis not present

## 2023-07-11 DIAGNOSIS — E109 Type 1 diabetes mellitus without complications: Secondary | ICD-10-CM | POA: Insufficient documentation

## 2023-07-11 DIAGNOSIS — N390 Urinary tract infection, site not specified: Secondary | ICD-10-CM | POA: Diagnosis not present

## 2023-07-11 DIAGNOSIS — R531 Weakness: Secondary | ICD-10-CM | POA: Diagnosis not present

## 2023-07-11 DIAGNOSIS — Z20822 Contact with and (suspected) exposure to covid-19: Secondary | ICD-10-CM | POA: Insufficient documentation

## 2023-07-11 LAB — CBC
HCT: 34.9 % — ABNORMAL LOW (ref 36.0–46.0)
Hemoglobin: 11.3 g/dL — ABNORMAL LOW (ref 12.0–15.0)
MCH: 29.5 pg (ref 26.0–34.0)
MCHC: 32.4 g/dL (ref 30.0–36.0)
MCV: 91.1 fL (ref 80.0–100.0)
Platelets: 128 10*3/uL — ABNORMAL LOW (ref 150–400)
RBC: 3.83 MIL/uL — ABNORMAL LOW (ref 3.87–5.11)
RDW: 14.5 % (ref 11.5–15.5)
WBC: 2.5 10*3/uL — ABNORMAL LOW (ref 4.0–10.5)
nRBC: 0 % (ref 0.0–0.2)

## 2023-07-11 LAB — LACTIC ACID, PLASMA: Lactic Acid, Venous: 1.1 mmol/L (ref 0.5–1.9)

## 2023-07-11 LAB — COMPREHENSIVE METABOLIC PANEL
ALT: 20 U/L (ref 0–44)
AST: 26 U/L (ref 15–41)
Albumin: 4.2 g/dL (ref 3.5–5.0)
Alkaline Phosphatase: 59 U/L (ref 38–126)
Anion gap: 14 (ref 5–15)
BUN: 22 mg/dL — ABNORMAL HIGH (ref 6–20)
CO2: 23 mmol/L (ref 22–32)
Calcium: 8.7 mg/dL — ABNORMAL LOW (ref 8.9–10.3)
Chloride: 97 mmol/L — ABNORMAL LOW (ref 98–111)
Creatinine, Ser: 1.11 mg/dL — ABNORMAL HIGH (ref 0.44–1.00)
GFR, Estimated: 58 mL/min — ABNORMAL LOW (ref >60)
Glucose, Bld: 119 mg/dL — ABNORMAL HIGH (ref 70–99)
Potassium: 3.4 mmol/L — ABNORMAL LOW (ref 3.5–5.1)
Sodium: 134 mmol/L — ABNORMAL LOW (ref 135–145)
Total Bilirubin: 0.9 mg/dL (ref 0.0–1.2)
Total Protein: 7.2 g/dL (ref 6.5–8.1)

## 2023-07-11 LAB — RESP PANEL BY RT-PCR (RSV, FLU A&B, COVID)  RVPGX2
Influenza A by PCR: NEGATIVE
Influenza B by PCR: NEGATIVE
Resp Syncytial Virus by PCR: POSITIVE — AB
SARS Coronavirus 2 by RT PCR: NEGATIVE

## 2023-07-11 LAB — CBG MONITORING, ED: Glucose-Capillary: 106 mg/dL — ABNORMAL HIGH (ref 70–99)

## 2023-07-11 LAB — URINALYSIS, W/ REFLEX TO CULTURE (INFECTION SUSPECTED)
Bilirubin Urine: NEGATIVE
Glucose, UA: >=500 mg/dL — AB
Ketones, ur: 5 mg/dL — AB
Nitrite: POSITIVE — AB
Protein, ur: 100 mg/dL — AB
Specific Gravity, Urine: 1.013 (ref 1.005–1.030)
WBC, UA: >50 WBC/hpf (ref 0–5)
pH: 6 (ref 5.0–8.0)

## 2023-07-11 MED ORDER — PREDNISONE 10 MG (21) PO TBPK: ORAL_TABLET | ORAL | 0 refills | Status: DC

## 2023-07-11 MED ORDER — CEFPODOXIME PROXETIL 200 MG PO TABS: 200.0000 mg | ORAL_TABLET | Freq: Two times a day (BID) | ORAL | 0 refills | Status: AC

## 2023-07-11 NOTE — ED Triage Notes (Signed)
Arrives from home via [facility]  C/O increasing confusion x 1 week.  Current COVID.  Hx UTI.    CBG:  114 98 125/60

## 2023-07-11 NOTE — ED Provider Notes (Signed)
[facility] Provider Note   Event Date/Time   First MD Initiated Contact with Patient 07/11/23 1415     (approximate) History  Altered Mental Status and Covid Positive  HPI Ashley [name] is a 58 y.o. female with a past medical history of type 1 diabetes, Graves' disease, hypertension, COPD, and hypothyroidism who presents complaining of congestion, cough, and altered mental status that is worsening over the past week.  Patient arrives with her son who states that she has been wondering where her husband was who has died 2 years ago as well as calling people the wrong names.  Patient has not been leaving stove's on, lighting candles and that in the barn, or getting lost in her car.  Patient can give me a full and complete history without difficulty. ROS: Patient currently denies any vision changes, tinnitus, difficulty speaking, facial droop, sore throat, chest pain, shortness of breath, abdominal pain, nausea/vomiting/diarrhea, dysuria, or weakness/numbness/paresthesias in any extremity   Physical Exam  Triage Vital Signs: ED Triage Vitals  Encounter Vitals Group     BP 07/11/23 1125 (!) 145/68     Systolic BP Percentile --      Diastolic BP Percentile --      Pulse Rate 07/11/23 1125 93     Resp 07/11/23 1125 20     Temp 07/11/23 1125 99 F (37.2 C)     Temp Source 07/11/23 1125 Oral     SpO2 07/11/23 1125 95 %     Weight 07/11/23 1126 160 lb (72.6 kg)     Height 07/11/23 1126 4\' 11"  (1.499 m)     Head Circumference --      Peak Flow --      Pain Score 07/11/23 1126 5     Pain Loc --      Pain Education --      Exclude from Growth Chart --    Most recent vital signs: Vitals:   07/11/23 1125 07/11/23 1417  BP: (!) 145/68 134/60  Pulse: 93 96  Resp: 20 16  Temp: 99 F (37.2 C) 98.5 F (36.9 C)  SpO2: 95% 98%   General: Awake, oriented x4. CV:  Good peripheral perfusion.  Resp:  Normal effort.  Abd:  No distention.  Other:  Middle-aged  obese Caucasian female resting comfortably in no acute distress ED Results / Procedures / Treatments  Labs (all labs ordered are listed, but only abnormal results are displayed) Labs Reviewed  RESP PANEL BY RT-PCR (RSV, FLU A&B, COVID)  RVPGX2 - Abnormal; Notable for the following components:      Result Value   Resp Syncytial Virus by PCR POSITIVE (*)    All other components within normal limits  COMPREHENSIVE METABOLIC PANEL - Abnormal; Notable for the following components:   Sodium 134 (*)    Potassium 3.4 (*)    Chloride 97 (*)    Glucose, Bld 119 (*)    BUN 22 (*)    Creatinine, Ser 1.11 (*)    Calcium 8.7 (*)    GFR, Estimated 58 (*)    All other components within normal limits  CBC - Abnormal; Notable for the following components:   WBC 2.5 (*)    RBC 3.83 (*)    Hemoglobin 11.3 (*)    HCT 34.9 (*)    Platelets 128 (*)    All other components within normal limits  URINALYSIS, W/ REFLEX TO CULTURE (INFECTION SUSPECTED) - Abnormal; Notable for the following components:  Color, Urine YELLOW (*)    APPearance HAZY (*)    Glucose, UA >=500 (*)    Hgb urine dipstick SMALL (*)    Ketones, ur 5 (*)    Protein, ur 100 (*)    Nitrite POSITIVE (*)    Leukocytes,Ua SMALL (*)    Bacteria, UA MANY (*)    All other components within normal limits  CBG MONITORING, ED - Abnormal; Notable for the following components:   Glucose-Capillary 106 (*)    All other components within normal limits  CULTURE, BLOOD (SINGLE)  URINE CULTURE  LACTIC ACID, PLASMA  LACTIC ACID, PLASMA   EKG ED ECG REPORT I, Merwyn Katos, the attending physician, personally viewed and interpreted this ECG. Date: 07/11/2023 EKG Time: 1128 Rate: 90 Rhythm: normal sinus rhythm QRS Axis: normal Intervals: normal ST/T Wave abnormalities: normal Narrative Interpretation: no evidence of acute ischemia RADIOLOGY ED MD interpretation: 2 view chest x-ray interpreted by me shows no evidence of acute  abnormalities including no pneumonia, pneumothorax, or widened mediastinum  CT of the head without contrast interpreted by me shows no evidence of acute abnormalities including no intracerebral hemorrhage, obvious masses, or significant edema -Agree with radiology assessment Official radiology report(s): DG Chest 2 View Result Date: 07/11/2023 CLINICAL DATA:  Cough. EXAM: CHEST - 2 VIEW COMPARISON:  Chest radiograph dated 04/19/2022. CT chest dated 02/12/2023. FINDINGS: The heart size and mediastinal contours are within normal limits. No focal consolidation, pleural effusion, or pneumothorax. No acute osseous abnormality. IMPRESSION: No acute cardiopulmonary findings. Electronically Signed   By: Hart Robinsons M.D.   On: 07/11/2023 12:17   CT Head Wo Contrast Result Date: 07/11/2023 CLINICAL DATA:  Provided history: Mental status change, unknown cause. Increasing confusion. Current COVID. EXAM: CT HEAD WITHOUT CONTRAST TECHNIQUE: Contiguous axial images were obtained from the base of the skull through the vertex without intravenous contrast. RADIATION DOSE REDUCTION: This exam was performed according to the departmental dose-optimization program which includes automated exposure control, adjustment of the mA and/or kV according to patient size and/or use of iterative reconstruction technique. COMPARISON:  Prior head CT examinations 06/28/2020 and earlier. Brain MRI 06/21/2020. FINDINGS: Brain: Generalized cerebral atrophy, mild but greater than expected for age. Patchy and ill-defined hypoattenuation again noted within the cerebral white matter, nonspecific. There is no acute intracranial hemorrhage. No demarcated cortical infarct. No extra-axial fluid collection. No evidence of an intracranial mass. No midline shift. Vascular: No hyperdense vessel.  Atherosclerotic calcifications. Skull: No calvarial fracture or aggressive osseous lesion. Sinuses/Orbits: No mass or acute finding within the imaged orbits.  Mild mucosal thickening within the bilateral maxillary sinuses the imaged levels. Mild mucosal thickening within the bilateral sphenoid sinuses. Moderate bilateral ethmoid sinusitis. Mild left frontal sinusitis. IMPRESSION: 1. No evidence of an acute intracranial abnormality. 2. Nonspecific cerebral white matter disease. 3. Generalized cerebral atrophy, mild but greater than expected for age. 4. Paranasal sinus disease at the imaged levels, as described. Electronically Signed   By: Jackey Loge D.O.   On: 07/11/2023 12:07   PROCEDURES: Critical Care performed: No Procedures MEDICATIONS ORDERED IN ED: Medications - No data to display IMPRESSION / MDM / ASSESSMENT AND PLAN / ED COURSE  I reviewed the triage vital signs and the nursing notes.                             The patient is on the cardiac monitor to evaluate for evidence of arrhythmia and/or  significant heart rate changes. Patient's presentation is most consistent with acute presentation with potential threat to life or bodily function. Patient is 58 year old female who presents for cough, congestion, and intermittent confusion UA positive for urinary tract infection.  Viral study also positive for RSV Not Pregnant. Unlikely TOA, Ovarian Torsion, PID, gonorrhea/chlamydia. Low suspicion for Infected Urolithiasis, AAA, Cholecystitis, Pancreatitis, SBO, Appendicitis, or other acute abdomen.  Rx: Cefpodoxime 300 mg BID for 5 days Disposition: Discharge home. SRP discussed. Advise follow up with primary care provider within 24-72 hours.   FINAL CLINICAL IMPRESSION(S) / ED DIAGNOSES   Final diagnoses:  Confusion  Urinary tract infection without hematuria, site unspecified  RSV (respiratory syncytial virus infection)   Rx / DC Orders   ED Discharge Orders          Ordered    cefpodoxime (VANTIN) 200 MG tablet  2 times daily        07/11/23 1440    predniSONE (STERAPRED UNI-PAK 21 TAB) 10 MG (21) TBPK tablet        07/11/23 1440            Note:  This document was prepared using Dragon voice recognition software and may include unintentional dictation errors.   Merwyn Katos, MD 07/11/23 [phone]

## 2023-07-11 NOTE — ED Triage Notes (Signed)
Patient presents with congestion, cough, and AMS. Son reports patient has been intermittently confused.   Reports burning during urination.

## 2023-07-14 LAB — URINE CULTURE: Culture: >=100000 — AB

## 2023-07-15 ENCOUNTER — Ambulatory Visit: Payer: Self-pay | Admitting: Licensed Clinical Social Worker

## 2023-07-16 LAB — CULTURE, BLOOD (SINGLE)
Culture: NO GROWTH
Special Requests: ADEQUATE

## 2023-07-22 DIAGNOSIS — Z794 Long term (current) use of insulin: Secondary | ICD-10-CM | POA: Diagnosis not present

## 2023-07-22 DIAGNOSIS — R300 Dysuria: Secondary | ICD-10-CM | POA: Diagnosis not present

## 2023-07-22 DIAGNOSIS — R110 Nausea: Secondary | ICD-10-CM | POA: Diagnosis not present

## 2023-07-22 DIAGNOSIS — D649 Anemia, unspecified: Secondary | ICD-10-CM | POA: Diagnosis not present

## 2023-07-22 DIAGNOSIS — R0609 Other forms of dyspnea: Secondary | ICD-10-CM | POA: Diagnosis not present

## 2023-07-22 DIAGNOSIS — J069 Acute upper respiratory infection, unspecified: Secondary | ICD-10-CM | POA: Diagnosis not present

## 2023-07-22 DIAGNOSIS — Z1152 Encounter for screening for COVID-19: Secondary | ICD-10-CM | POA: Diagnosis not present

## 2023-07-22 DIAGNOSIS — R051 Acute cough: Secondary | ICD-10-CM | POA: Diagnosis not present

## 2023-07-22 DIAGNOSIS — E1040 Type 1 diabetes mellitus with diabetic neuropathy, unspecified: Secondary | ICD-10-CM | POA: Diagnosis not present

## 2023-07-22 DIAGNOSIS — N390 Urinary tract infection, site not specified: Secondary | ICD-10-CM | POA: Diagnosis not present

## 2023-07-30 DIAGNOSIS — K3184 Gastroparesis: Secondary | ICD-10-CM | POA: Diagnosis not present

## 2023-07-30 DIAGNOSIS — I739 Peripheral vascular disease, unspecified: Secondary | ICD-10-CM | POA: Diagnosis not present

## 2023-07-30 DIAGNOSIS — H15003 Unspecified scleritis, bilateral: Secondary | ICD-10-CM | POA: Diagnosis not present

## 2023-07-30 DIAGNOSIS — R399 Unspecified symptoms and signs involving the genitourinary system: Secondary | ICD-10-CM | POA: Diagnosis not present

## 2023-07-30 DIAGNOSIS — M810 Age-related osteoporosis without current pathological fracture: Secondary | ICD-10-CM | POA: Diagnosis not present

## 2023-07-30 DIAGNOSIS — I10 Essential (primary) hypertension: Secondary | ICD-10-CM | POA: Diagnosis not present

## 2023-07-30 DIAGNOSIS — E1040 Type 1 diabetes mellitus with diabetic neuropathy, unspecified: Secondary | ICD-10-CM | POA: Diagnosis not present

## 2023-07-30 DIAGNOSIS — J439 Emphysema, unspecified: Secondary | ICD-10-CM | POA: Diagnosis not present

## 2023-08-07 ENCOUNTER — Encounter: Payer: Self-pay | Admitting: Licensed Clinical Social Worker

## 2023-08-07 DIAGNOSIS — Z4681 Encounter for fitting and adjustment of insulin pump: Secondary | ICD-10-CM | POA: Diagnosis not present

## 2023-08-07 DIAGNOSIS — Z794 Long term (current) use of insulin: Secondary | ICD-10-CM | POA: Diagnosis not present

## 2023-08-07 DIAGNOSIS — E785 Hyperlipidemia, unspecified: Secondary | ICD-10-CM | POA: Diagnosis not present

## 2023-08-07 DIAGNOSIS — I10 Essential (primary) hypertension: Secondary | ICD-10-CM | POA: Diagnosis not present

## 2023-08-07 DIAGNOSIS — E1040 Type 1 diabetes mellitus with diabetic neuropathy, unspecified: Secondary | ICD-10-CM | POA: Diagnosis not present

## 2023-08-08 DIAGNOSIS — E559 Vitamin D deficiency, unspecified: Secondary | ICD-10-CM | POA: Diagnosis not present

## 2023-08-08 DIAGNOSIS — I2510 Atherosclerotic heart disease of native coronary artery without angina pectoris: Secondary | ICD-10-CM | POA: Diagnosis not present

## 2023-08-08 DIAGNOSIS — M810 Age-related osteoporosis without current pathological fracture: Secondary | ICD-10-CM | POA: Diagnosis not present

## 2023-08-08 DIAGNOSIS — E039 Hypothyroidism, unspecified: Secondary | ICD-10-CM | POA: Diagnosis not present

## 2023-08-08 DIAGNOSIS — K219 Gastro-esophageal reflux disease without esophagitis: Secondary | ICD-10-CM | POA: Diagnosis not present

## 2023-08-08 DIAGNOSIS — I10 Essential (primary) hypertension: Secondary | ICD-10-CM | POA: Diagnosis not present

## 2023-08-20 DIAGNOSIS — Z79899 Other long term (current) drug therapy: Secondary | ICD-10-CM | POA: Diagnosis not present

## 2023-08-20 DIAGNOSIS — Z961 Presence of intraocular lens: Secondary | ICD-10-CM | POA: Diagnosis not present

## 2023-08-20 DIAGNOSIS — H3581 Retinal edema: Secondary | ICD-10-CM | POA: Diagnosis not present

## 2023-08-20 DIAGNOSIS — E109 Type 1 diabetes mellitus without complications: Secondary | ICD-10-CM | POA: Diagnosis not present

## 2023-08-20 DIAGNOSIS — H30033 Focal chorioretinal inflammation, peripheral, bilateral: Secondary | ICD-10-CM | POA: Diagnosis not present

## 2023-08-22 ENCOUNTER — Telehealth: Payer: Self-pay | Admitting: Pulmonary Disease

## 2023-08-22 NOTE — Telephone Encounter (Signed)
 PT would like Trelegy samples please. Her # is [phone]

## 2023-08-23 ENCOUNTER — Other Ambulatory Visit: Payer: Self-pay | Admitting: *Deleted

## 2023-08-23 ENCOUNTER — Telehealth: Payer: Self-pay | Admitting: *Deleted

## 2023-08-23 MED ORDER — BUDESONIDE 0.25 MG/2ML IN SUSP: 0.2500 mg | Freq: Two times a day (BID) | RESPIRATORY_TRACT | 1 refills | Status: DC | PRN

## 2023-08-23 NOTE — Addendum Note (Signed)
 Addended by: Delrae Rend on: 08/23/2023 04:21 PM   Modules accepted: Orders

## 2023-08-23 NOTE — Telephone Encounter (Signed)
 Called and spoke with patient regarding sample of Trelegy.  I advised her that the last note I saw mentioned Breztri.  She said she had not used that in quite some time.  She said she was in the hospital and they gave her Trelegy and it helped.  She stopped using it and has not used it in a while, it is expensive.  I advised her that I would send a message to our pharmacy team to see what is covered under her insurance and in the meantime I would refill her Pulmicort.  I scheduled her an appointment to see Rubye Oaks NP to discuss need for daily inhaler versus continued prn Pulmicort nebs.  I advised her to call us back if she was still having dyspnea/wheezing despite using the Pulmicort and we would look at doing something else.  She verified understanding.  Pharmacy team, please advise on inhalers covered by her insurance that are comparable to Trelegy. Thank you.

## 2023-08-23 NOTE — Telephone Encounter (Signed)
See other encounter.  Closing this encounter.

## 2023-08-26 ENCOUNTER — Telehealth: Payer: Self-pay

## 2023-08-26 ENCOUNTER — Other Ambulatory Visit (HOSPITAL_COMMUNITY): Payer: Self-pay

## 2023-08-26 NOTE — Telephone Encounter (Signed)
 Patient has a deductible to meet contributing to the high copays. Both Trelegy and Markus Daft are showing $253.29/30 days. After the patient meets her deductible these prices will go down. I also investigated if the patient used 2 different inhalers and it would be the same price/ more expensive due to the deductible.

## 2023-08-26 NOTE — Telephone Encounter (Signed)
*  sent to office in original message    Patient has a deductible to meet contributing to the high copays. Both Trelegy and Markus Daft are showing $253.29/30 days. After the patient meets her deductible these prices will go down. I also investigated if the patient used 2 different inhalers and it would be the same price/ more expensive due to the deductible.

## 2023-08-29 DIAGNOSIS — H209 Unspecified iridocyclitis: Secondary | ICD-10-CM | POA: Diagnosis not present

## 2023-08-29 DIAGNOSIS — H40053 Ocular hypertension, bilateral: Secondary | ICD-10-CM | POA: Diagnosis not present

## 2023-08-29 DIAGNOSIS — Z961 Presence of intraocular lens: Secondary | ICD-10-CM | POA: Diagnosis not present

## 2023-08-29 DIAGNOSIS — H0520 Unspecified exophthalmos: Secondary | ICD-10-CM | POA: Diagnosis not present

## 2023-08-29 DIAGNOSIS — H35353 Cystoid macular degeneration, bilateral: Secondary | ICD-10-CM | POA: Diagnosis not present

## 2023-08-29 DIAGNOSIS — E0500 Thyrotoxicosis with diffuse goiter without thyrotoxic crisis or storm: Secondary | ICD-10-CM | POA: Diagnosis not present

## 2023-08-29 DIAGNOSIS — E109 Type 1 diabetes mellitus without complications: Secondary | ICD-10-CM | POA: Diagnosis not present

## 2023-09-02 ENCOUNTER — Ambulatory Visit (INDEPENDENT_AMBULATORY_CARE_PROVIDER_SITE_OTHER)

## 2023-09-02 ENCOUNTER — Ambulatory Visit: Admitting: Podiatry

## 2023-09-02 ENCOUNTER — Encounter: Payer: Self-pay | Admitting: Podiatry

## 2023-09-02 DIAGNOSIS — M79672 Pain in left foot: Secondary | ICD-10-CM

## 2023-09-02 DIAGNOSIS — G8929 Other chronic pain: Secondary | ICD-10-CM

## 2023-09-02 DIAGNOSIS — M722 Plantar fascial fibromatosis: Secondary | ICD-10-CM | POA: Diagnosis not present

## 2023-09-02 MED ORDER — TRIAMCINOLONE ACETONIDE 10 MG/ML IJ SUSP
10.0000 mg | Freq: Once | INTRAMUSCULAR | Status: AC
Start: 2023-09-02 — End: 2023-09-02
  Administered 2023-09-02: 10 mg via INTRA_ARTICULAR

## 2023-09-04 NOTE — Progress Notes (Signed)
 Subjective:   Patient ID: Ashley [name], female   DOB: 58 y.o.   MRN: [id number]   HPI Patient presents stating she has developed a lot of pain in her left heel and it had been doing well for a while but has recently started to hurt again   ROS      Objective:  Physical Exam  Neurovascular status intact inflammation pain of the left plantar fascia at the insertional point tendon calcaneus fluid buildup noted     Assessment:  Acute plantar fasciitis left inflammation fluid at the insertional point     Plan:  H&P reviewed sterile prep injected the fascia at insertion 3 mg Kenalog 5 mg Xylocaine applied sterile dressing instructed on shoe gear and physical therapy reappoint to recheck as needed  X-rays indicate small spur no indication of stress fracture arthritis

## 2023-09-05 NOTE — Telephone Encounter (Signed)
 NFN

## 2023-09-10 DIAGNOSIS — M810 Age-related osteoporosis without current pathological fracture: Secondary | ICD-10-CM | POA: Diagnosis not present

## 2023-09-18 ENCOUNTER — Other Ambulatory Visit: Payer: Self-pay | Admitting: Adult Health

## 2023-09-18 MED ORDER — BUDESONIDE 0.25 MG/2ML IN SUSP: 0.2500 mg | Freq: Two times a day (BID) | RESPIRATORY_TRACT | 1 refills | Status: AC | PRN

## 2023-09-18 NOTE — Telephone Encounter (Signed)
 Copied from CRM [id number]. Topic: Clinical - Medication Refill >> Sep 18, 2023  8:14 AM Isabell A wrote: Most Recent Primary Care Visit:   Medication: budesonide (PULMICORT) 0.25 MG/2ML nebulizer solution [  Has the patient contacted their pharmacy? Yes (Agent: If no, request that the patient contact the pharmacy for the refill. If patient does not wish to contact the pharmacy document the reason why and proceed with request.) (Agent: If yes, when and what did the pharmacy advise?)  Is this the correct pharmacy for this prescription? Yes If no, delete pharmacy and type the correct one.  This is the patient's preferred pharmacy:  [facility] - [place], [place] - [address] [place] [address] Phone: [phone] Fax: [phone]  Has the prescription been filled recently? Yes  Is the patient out of the medication? N/A  Has the patient been seen for an appointment in the last year OR does the patient have an upcoming appointment? Yes  Can we respond through MyChart? No  Agent: Please be advised that Rx refills may take up to 3 business days. We ask that you follow-up with your pharmacy.

## 2023-09-23 ENCOUNTER — Ambulatory Visit: Admitting: Podiatry

## 2023-09-23 ENCOUNTER — Encounter: Payer: Self-pay | Admitting: Podiatry

## 2023-09-23 DIAGNOSIS — M21612 Bunion of left foot: Secondary | ICD-10-CM

## 2023-09-23 DIAGNOSIS — M21611 Bunion of right foot: Secondary | ICD-10-CM | POA: Diagnosis not present

## 2023-09-23 DIAGNOSIS — M722 Plantar fascial fibromatosis: Secondary | ICD-10-CM

## 2023-09-23 MED ORDER — TRIAMCINOLONE ACETONIDE 10 MG/ML IJ SUSP
10.0000 mg | Freq: Once | INTRAMUSCULAR | Status: AC
Start: 2023-09-23 — End: 2023-09-23
  Administered 2023-09-23: 10 mg via INTRA_ARTICULAR

## 2023-09-23 NOTE — Progress Notes (Signed)
 Subjective:   Patient ID: Ashley [name], female   DOB: 58 y.o.   MRN: [id number]   HPI Patient states she is having a lot of pain in the plantar of the left heel and at this point she is not able to bear weight down on it due to the intensity of discomfort.  States that she is not able to do the work she needs to do.  Also complains about bunion deformity left over right and is interested in correction and would like to know what needs to be done   ROS      Objective:  Physical Exam  Neurovascular status intact with exquisite discomfort plantar aspect the left heel at the insertional point of the tendon into the calcaneus with fluid buildup around the medial band.  Also was noted to have prominence of the first metatarsal head left over right redness and pain with patient having tried wider shoe gear soaks and cushioning without relief     Assessment:  Acute plantar fasciitis left with bunion deformity that is getting worse left over right foot     Plan:  H&P reviewed conditions and discussed intensity of discomfort and went ahead today and did sterile prep and injected the plantar fascia again left heel 3 mg Kenalog 5 mg Xylocaine dispensed air fracture walker that was properly fitted to her lower leg left to completely immobilize and take all the pressure off the plantar tendon.  Patient also could use this postop when bunion correction is done and we will see the patient in 2 months to review how the foot is doing and whether anything else will be necessary

## 2023-09-25 NOTE — Telephone Encounter (Signed)
 Patient has a f/u on 09/24/2023 at 11:30 am.  Nothing further needed.

## 2023-10-04 ENCOUNTER — Encounter: Payer: Self-pay | Admitting: Adult Health

## 2023-10-04 ENCOUNTER — Ambulatory Visit: Admitting: Adult Health

## 2023-10-04 VITALS — BP 146/70 | HR 81 | Ht 59.0 in | Wt 158.2 lb

## 2023-10-04 DIAGNOSIS — J449 Chronic obstructive pulmonary disease, unspecified: Secondary | ICD-10-CM

## 2023-10-04 DIAGNOSIS — J4520 Mild intermittent asthma, uncomplicated: Secondary | ICD-10-CM

## 2023-10-04 DIAGNOSIS — J301 Allergic rhinitis due to pollen: Secondary | ICD-10-CM | POA: Diagnosis not present

## 2023-10-04 DIAGNOSIS — Z87891 Personal history of nicotine dependence: Secondary | ICD-10-CM

## 2023-10-04 MED ORDER — TRELEGY ELLIPTA 100-62.5-25 MCG/ACT IN AEPB: 1.0000 | INHALATION_SPRAY | Freq: Every day | RESPIRATORY_TRACT | 11 refills | Status: DC

## 2023-10-04 NOTE — Progress Notes (Signed)
 @Patient  ID: Ashley [name], female    DOB: 1966-04-24, 58 y.o.   MRN: [id number]  Chief Complaint  Patient presents with   Follow-up    Referring provider: Avva, Ravisankar, MD  HPI: 58 year old female former smoker followed for COPD with asthma Medical significant for GERD, gastroparesis, type 1 diabetes, rheumatoid arthritis on methotrexate  COVID-19 infection with COVID-pneumonia in January 2022 requiring hospitalization complicated by acute respiratory failure requiring vent support, long recovery requiring prolonged oxygen use History of sleep apnea-CPAP intolerant followed by neurology Participates in the lung cancer CT chest screening program.  TEST/EVENTS :  PFTs February 02, 2021 showed normal lung function with FEV1 at 83%, ratio 87, FVC 75%, no significant bronchodilator response, DLCO 80%.   Lung cancer CT chest February 08, 2021 showed mild emphysema, left upper lobe nodule measuring 1.9 mm, lung RADS 2   Allergy panel September 07, 2021 negative, IgE 6, eosinophils 200  10/04/2023 Follow up: COPD with Asthma  Discussed the use of AI scribe software for clinical note transcription with the patient, who gave verbal consent to proceed.  History of Present Illness   Ashley [name] is a 58 year old female with COPD who presents for medication management and 73-month follow-up.  Patient was having issues with her current maintenance regimen with Trelegy inhaler.  Was switched to Pulmicort  via nebulizer twice daily.  She wants to switch back to Trelegy as she felt that it worked much better.  Would like this to be sent into her mail order pharmacy  She says overall breathing is doing about the same does feel that she was better when she was on Trelegy.  Does get short of breath with prolonged activities.  No recent prednisone  use.  Remains on Singulair  daily.  Participates in the lung cancer CT screening program.  Has an upcoming CT chest in September.  She is currently taking  Singulair  for allergies. She was previously on Xyzal , which was discontinued for an unspecified reason. She experiences allergy symptoms, particularly with pollen.       Allergies  Allergen Reactions   Mirtazapine Hives and Rash   Clindamycin Hives   Macrobid [Nitrofurantoin] Hives   Other Hives    Tylox    Sulfonamide Derivatives Hives   Alendronate Sodium Itching   Dorzolamide Other (See Comments)   Fosamax [Alendronate]     Other reaction(s): HIVES   Levocetirizine Other (See Comments)   Lipitor [Atorvastatin] Other (See Comments)    MUSCLE ACHES   Restasis [Cyclosporine]     Pt stated, "My eyes turned red on the inside and outside of eye; burning sensation"   Sulfa Antibiotics Hives   Topamax [Topiramate] Itching    Immunization History  Administered Date(s) Administered   Influenza Split 03/25/2002, 03/07/2011, 06/15/2011, 04/11/2012, 02/25/2013, 06/30/2013, 03/17/2014   Influenza, Quadrivalent, Recombinant, Inj, Pf 02/18/2018, 02/26/2019, 04/08/2020, 04/25/2021, 03/12/2023   Influenza-Unspecified 03/06/2017, 05/01/2022   Moderna Sars-Covid-2 Vaccination 05/03/2020, 05/19/2020, 01/21/2021   PNEUMOCOCCAL CONJUGATE-20 02/16/2021, 10/25/2021   Pneumococcal Polysaccharide-23 03/25/2002, 02/25/2013, 02/16/2021   Tdap 06/17/2012   Unspecified SARS-COV-2 Vaccination 05/19/2020, 11/21/2020   Zoster, Live 06/30/2013    Past Medical History:  Diagnosis Date   Anxiety    COPD (chronic obstructive pulmonary disease) (HCC)    Depression    DOE (dyspnea on exertion)    Dyspnea    Gastroparesis    GERD (gastroesophageal reflux disease)    Glaucoma, both eyes    History of 2019 novel coronavirus disease (COVID-19) 06/20/2020  hospital admission-- dx severe covid with pneumonia, ARDS, DKA, COPD exaberation;  pt intubated 01-13th and extubated 01-20th;   (11-15-2020  per pt did not go home on oxygen, residual generalized weakness and hair loss)   History of esophageal stricture     post dilatation, followed by dr Elvin Hammer   History of Graves' disease    s/p RAI  03-03-2010   History of Helicobacter pylori infection    remote hx and treatment   History of kidney stones    Hyperlipidemia    Hypertension    followed by pcp   Hypothyroidism, postradioiodine therapy 02/2010   followed by pcp   IDA (iron deficiency anemia)    Insulin  pump in place    w/ Novolog  , managed by pcp   Moderate asthma    followed by pcp,  (11-15-2020  per pt has pulmonology appt on 11-17-2020))   OSA (obstructive sleep apnea)    11-15-2020  per pt has not used cpap since 05/ 2021  (previous followed by dr Albertina Hugger,  study in epic 05-12-2018 moderate complex osa pt had used for awhile)   Osteoporosis    PAD (peripheral artery disease) (HCC)    followed by dr berry----  s/p bilateral common iliac artery angioplasty stenting for stenosis   Peripheral neuropathy    Pneumonia    covid 2022   PONV (postoperative nausea and vomiting)    Rheumatoid arthritis (HCC)    in both eyes   Right wrist fracture    Type 1 diabetes mellitus with long-term current use of insulin  (HCC)    per pt dx at age 38 approx,  followed by pcp,  (11-15-2020  per pt last A1c 7.2 in 04/ 2022),  checks blood sugar 6 to 10 times daily , fasting sugar-- 200 -- 300)   Vitamin D  deficiency     Tobacco History: Social History   Tobacco Use  Smoking Status Former   Current packs/day: 0.00   Average packs/day: 0.5 packs/day for 40.0 years (20.0 ttl pk-yrs)   Types: Cigarettes   Start date: 35   Quit date: 06/20/2020   Years since quitting: 3.2  Smokeless Tobacco Never  Tobacco Comments   Started smoking at age [age]   Counseling given: Not Answered Tobacco comments: Started smoking at age [age]   Outpatient Medications Prior to Visit  Medication Sig Dispense Refill   ACCU-CHEK COMPACT PLUS test strip 1 each by Other route as needed.      ACCU-CHEK SOFTCLIX LANCETS lancets      acetaminophen  (TYLENOL ) 500 MG  tablet Take 1,000 mg by mouth every 6 (six) hours as needed for moderate pain.     albuterol  (PROVENTIL ) (2.5 MG/3ML) 0.083% nebulizer solution Take 3 mLs (2.5 mg total) by nebulization every 4 (four) hours as needed for wheezing or shortness of breath. 75 mL 3   albuterol  (VENTOLIN  HFA) 108 (90 Base) MCG/ACT inhaler Inhale into the lungs.     ALPRAZolam  (XANAX ) 0.5 MG tablet Take 0.5-1 mg by mouth 3 (three) times daily as needed for anxiety.     AMBULATORY NON FORMULARY MEDICATION NOVOLOG  INSULIN  PUMP USES AS DIRECTED     aspirin  EC 81 MG tablet Take 81 mg by mouth daily. Swallow whole.     Blood Glucose Monitoring Suppl (ACCU-CHEK AVIVA PLUS) w/Device KIT      budesonide  (PULMICORT ) 0.25 MG/2ML nebulizer solution Take 2 mLs (0.25 mg total) by nebulization 2 (two) times daily as needed (wheezing, dyspnea). 360 mL 1  clopidogrel  (PLAVIX ) 75 MG tablet Take by mouth.     Continuous Glucose Sensor (DEXCOM G7 SENSOR) MISC      Fluocinolone Acetonide  0.18 MG IMPL by Intravitreal route.     folic acid  (FOLVITE ) 1 MG tablet Take 1 tablet by mouth daily.     Insulin  Human (INSULIN  PUMP) SOLN Inject into the skin. Novolog ----  basal rate--- 12 am - 2 am = 1.3, 2 am - 6 am = 1.4, 6 am - 3 pm = 1.8, 3 pm - 12 am = 1.55     lansoprazole  (PREVACID ) 30 MG capsule Take 1 capsule (30 mg total) by mouth 2 (two) times daily before a meal. 180 capsule 1   latanoprost  (XALATAN ) 0.005 % ophthalmic solution Apply to eye.     lidocaine  (XYLOCAINE ) 2 % solution SMARTSIG:15 Milliliter(s) By Mouth 3 Times Daily PRN     methotrexate  (RHEUMATREX) 2.5 MG tablet Take 6 tablets (15 mg total) by mouth once a week. 3 tablets am and at bedtime twice a day once a week,  Saturday's (Patient taking differently: Take 25 mg by mouth once a week. 5 tablets am and at bedtime twice a day once a week,  Saturday's)     metoprolol  succinate (TOPROL -XL) 50 MG 24 hr tablet Take 50 mg by mouth daily. Take with or immediately following a meal.      montelukast  (SINGULAIR ) 10 MG tablet Take 10 mg by mouth at bedtime.     NOVOLOG  100 UNIT/ML injection Inject into the skin See admin instructions. Pt has Insulin  pump     pregabalin  (LYRICA ) 75 MG capsule Take 75-150 mg by mouth See admin instructions. Take 75 mg in the morning, 75 mg at lunch, and 150 mg at bedtime     rosuvastatin  (CRESTOR ) 40 MG tablet Take 40 mg by mouth daily.     sertraline  (ZOLOFT ) 100 MG tablet 2 po qd     Vitamin D , Ergocalciferol , (DRISDOL ) 50000 UNITS CAPS Take 50,000 Units by mouth every Saturday.     Albuterol  Sulfate, sensor, (PROAIR  DIGIHALER) 108 (90 Base) MCG/ACT AEPB Inhale 2 puffs into the lungs every 6 (six) hours as needed (shortness of breath).     No facility-administered medications prior to visit.     Review of Systems:   Constitutional:   No  weight loss, night sweats,  Fevers, chills, +fatigue, or  lassitude.  HEENT:   No headaches,  Difficulty swallowing,  Tooth/dental problems, or  Sore throat,                No sneezing, itching, ear ache, nasal congestion, post nasal drip,   CV:  No chest pain,  Orthopnea, PND, swelling in lower extremities, anasarca, dizziness, palpitations, syncope.   GI  No heartburn, indigestion, abdominal pain, nausea, vomiting, diarrhea, change in bowel habits, loss of appetite, bloody stools.   Resp:   No chest wall deformity  Skin: no rash or lesions.  GU: no dysuria, change in color of urine, no urgency or frequency.  No flank pain, no hematuria   MS:  No joint pain or swelling.  No decreased range of motion.  No back pain.    Physical Exam  BP (!) 146/70 (BP Location: Left Arm, Patient Position: Sitting, Cuff Size: Normal)   Pulse 81   Ht 4\' 11"  (1.499 m)   Wt 158 lb 3.2 oz (71.8 kg)   SpO2 96%   BMI 31.95 kg/m   GEN: A/Ox3; pleasant , NAD, well nourished  HEENT:  [place]/AT,  NOSE-clear, THROAT-clear, no lesions, no postnasal drip or exudate noted.   NECK:  Supple w/ fair ROM; no JVD; normal  carotid impulses w/o bruits; no thyromegaly or nodules palpated; no lymphadenopathy.    RESP  Clear  P & A; w/o, wheezes/ rales/ or rhonchi. no accessory muscle use, no dullness to percussion  CARD:  RRR, no m/r/g, no peripheral edema, pulses intact, no cyanosis or clubbing.  GI:   Soft & nt; nml bowel sounds; no organomegaly or masses detected.   Musco: Warm bil, no deformities or joint swelling noted.   Neuro: alert, no focal deficits noted.    Skin: Warm, no lesions or rashes    Lab Results:  CBC     BNP   ProBNP No results found for: "PROBNP"  Imaging:      Latest Ref Rng & Units 02/14/2023    4:18 PM 02/02/2021   12:45 PM  PFT Results  FVC-Pre L 2.29  2.25   FVC-Predicted Pre % 75  76   FVC-Post L  2.22   FVC-Predicted Post %  75   Pre FEV1/FVC % % 85  85   Post FEV1/FCV % %  87   FEV1-Pre L 1.95  1.91   FEV1-Predicted Pre % 82  82   FEV1-Post L  1.94   DLCO uncorrected ml/min/mmHg 14.85  14.42   DLCO UNC% % 80  80   DLCO corrected ml/min/mmHg 15.62  14.84   DLCO COR %Predicted % 84  82   DLVA Predicted % 103  94   TLC L  3.89   TLC % Predicted %  87   RV % Predicted %  81     Lab Results  Component Value Date   NITRICOXIDE 15 04/26/2015        Assessment & Plan:  Assessment and Plan    Chronic Obstructive Pulmonary Disease (COPD)   COPD management -restart Trelegy inhaler.  Stop budesonide  nebulizer.  Order sent to mail in pharmacy per her request. Albuterol  as needed.  History of smoking.  Continue with the lung cancer CT screening program.  Allergic Rhinitis   Allergic rhinitis symptoms worsen with pollen exposure.  Continue on Singulair  daily.      I spent 30   minutes dedicated to the care of this patient on the date of this encounter to include pre-visit review of records, face-to-face time with the patient discussing conditions above, post visit ordering of testing, clinical documentation with the electronic health record,  making appropriate referrals as documented, and communicating necessary findings to members of the patients care team.   Roena Clark, NP 10/04/2023

## 2023-10-04 NOTE — Patient Instructions (Addendum)
 Restart Trelegy 1 puff daily, rinse after use.  Stop Budesonide  neb .  Albuterol  inhaler As needed   Continue on Singulair  10mg  daily  Yearly CT chest this fall as planned.  Follow up with Dr. Diania Fortes or Lynia Landry NP in 4-6 months -30 min slot

## 2023-10-08 DIAGNOSIS — E1040 Type 1 diabetes mellitus with diabetic neuropathy, unspecified: Secondary | ICD-10-CM | POA: Diagnosis not present

## 2023-10-08 DIAGNOSIS — Z794 Long term (current) use of insulin: Secondary | ICD-10-CM | POA: Diagnosis not present

## 2023-10-08 DIAGNOSIS — G4733 Obstructive sleep apnea (adult) (pediatric): Secondary | ICD-10-CM | POA: Diagnosis not present

## 2023-10-08 DIAGNOSIS — E669 Obesity, unspecified: Secondary | ICD-10-CM | POA: Diagnosis not present

## 2023-10-08 DIAGNOSIS — Z4681 Encounter for fitting and adjustment of insulin pump: Secondary | ICD-10-CM | POA: Diagnosis not present

## 2023-10-08 DIAGNOSIS — I10 Essential (primary) hypertension: Secondary | ICD-10-CM | POA: Diagnosis not present

## 2023-10-08 DIAGNOSIS — E785 Hyperlipidemia, unspecified: Secondary | ICD-10-CM | POA: Diagnosis not present

## 2023-10-09 DIAGNOSIS — E1040 Type 1 diabetes mellitus with diabetic neuropathy, unspecified: Secondary | ICD-10-CM | POA: Diagnosis not present

## 2023-10-28 ENCOUNTER — Ambulatory Visit: Admitting: Podiatry

## 2023-11-12 DIAGNOSIS — E785 Hyperlipidemia, unspecified: Secondary | ICD-10-CM | POA: Diagnosis not present

## 2023-11-12 DIAGNOSIS — D649 Anemia, unspecified: Secondary | ICD-10-CM | POA: Diagnosis not present

## 2023-11-12 DIAGNOSIS — Z1212 Encounter for screening for malignant neoplasm of rectum: Secondary | ICD-10-CM | POA: Diagnosis not present

## 2023-11-12 DIAGNOSIS — E0500 Thyrotoxicosis with diffuse goiter without thyrotoxic crisis or storm: Secondary | ICD-10-CM | POA: Diagnosis not present

## 2023-11-12 DIAGNOSIS — M810 Age-related osteoporosis without current pathological fracture: Secondary | ICD-10-CM | POA: Diagnosis not present

## 2023-11-14 DIAGNOSIS — E785 Hyperlipidemia, unspecified: Secondary | ICD-10-CM | POA: Diagnosis not present

## 2023-11-14 DIAGNOSIS — E1040 Type 1 diabetes mellitus with diabetic neuropathy, unspecified: Secondary | ICD-10-CM | POA: Diagnosis not present

## 2023-11-14 DIAGNOSIS — I10 Essential (primary) hypertension: Secondary | ICD-10-CM | POA: Diagnosis not present

## 2023-11-19 DIAGNOSIS — M810 Age-related osteoporosis without current pathological fracture: Secondary | ICD-10-CM | POA: Diagnosis not present

## 2023-11-19 DIAGNOSIS — F17211 Nicotine dependence, cigarettes, in remission: Secondary | ICD-10-CM | POA: Diagnosis not present

## 2023-11-19 DIAGNOSIS — Z1331 Encounter for screening for depression: Secondary | ICD-10-CM | POA: Diagnosis not present

## 2023-11-19 DIAGNOSIS — Z0000 Encounter for general adult medical examination without abnormal findings: Secondary | ICD-10-CM | POA: Diagnosis not present

## 2023-11-19 DIAGNOSIS — F418 Other specified anxiety disorders: Secondary | ICD-10-CM | POA: Diagnosis not present

## 2023-11-19 DIAGNOSIS — H15003 Unspecified scleritis, bilateral: Secondary | ICD-10-CM | POA: Diagnosis not present

## 2023-11-19 DIAGNOSIS — J439 Emphysema, unspecified: Secondary | ICD-10-CM | POA: Diagnosis not present

## 2023-11-19 DIAGNOSIS — G4733 Obstructive sleep apnea (adult) (pediatric): Secondary | ICD-10-CM | POA: Diagnosis not present

## 2023-11-19 DIAGNOSIS — Z1339 Encounter for screening examination for other mental health and behavioral disorders: Secondary | ICD-10-CM | POA: Diagnosis not present

## 2023-11-19 DIAGNOSIS — R82998 Other abnormal findings in urine: Secondary | ICD-10-CM | POA: Diagnosis not present

## 2023-11-19 DIAGNOSIS — E1040 Type 1 diabetes mellitus with diabetic neuropathy, unspecified: Secondary | ICD-10-CM | POA: Diagnosis not present

## 2023-11-19 DIAGNOSIS — I10 Essential (primary) hypertension: Secondary | ICD-10-CM | POA: Diagnosis not present

## 2023-11-22 ENCOUNTER — Telehealth: Payer: Self-pay | Admitting: Family Medicine

## 2023-11-22 NOTE — Telephone Encounter (Signed)
 Please call and offer follow up with myself or Dr Albertina Hugger. I received note from PCP that she was not using CPAP but may wish to restart. She also mentioned [facility]. I don't think she would qualify based on previous sleep study results but I am more than happy to get her back in with Dr Dohmeier to review and retest as appropriate. TY!

## 2023-11-22 NOTE — Telephone Encounter (Signed)
 LVM asking pt to call back to schedule an appointment with Amy or Dr. Albertina Hugger  I have held a slot with Amy for 11/25/23 at 9:30am in case she calls back

## 2023-12-12 ENCOUNTER — Encounter: Payer: Self-pay | Admitting: Physician Assistant

## 2023-12-16 DIAGNOSIS — Z01419 Encounter for gynecological examination (general) (routine) without abnormal findings: Secondary | ICD-10-CM | POA: Diagnosis not present

## 2023-12-16 DIAGNOSIS — Z6830 Body mass index (BMI) 30.0-30.9, adult: Secondary | ICD-10-CM | POA: Diagnosis not present

## 2023-12-16 DIAGNOSIS — Z1231 Encounter for screening mammogram for malignant neoplasm of breast: Secondary | ICD-10-CM | POA: Diagnosis not present

## 2023-12-17 ENCOUNTER — Telehealth: Payer: Self-pay

## 2023-12-17 NOTE — Telephone Encounter (Signed)
   Patient Name: Ashley [name]  DOB: Dec 09, 1965 MRN: [id number]  Primary Cardiologist: Dorn Lesches, MD  Chart reviewed as part of pre-operative protocol coverage.   Simple dental extractions (i.e. 1-2 teeth) are considered low risk procedures per guidelines and generally do not require any specific cardiac clearance. It is also generally accepted that for simple extractions and dental cleanings, there is no need to interrupt blood thinner therapy.  SBE prophylaxis is not required for the patient from a cardiac standpoint.  I will route this recommendation to the requesting party via Epic fax function and remove from pre-op pool.  Please call with questions.  Damien JAYSON Braver, NP 12/17/2023, 3:56 PM

## 2023-12-17 NOTE — Telephone Encounter (Signed)
   Pre-operative Risk Assessment    Patient Name: Ashley [name]  DOB: 1966-01-20 MRN: [id number]   Date of last office visit: 04/03/23 Date of next office visit: n/a   Request for Surgical Clearance    Procedure:  Dental Extraction - Amount of Teeth to be Pulled:  Surgical #3   Date of Surgery:  Clearance 12/31/23                                 Surgeon:  Richarda Repine, DMD  Surgeon's Group or Practice Name:  [facility]  Phone number:  [phone] Fax number:  [phone]   Type of Clearance Requested:   - Medical  - Pharmacy:  Hold Aspirin  Not indicated    Type of Anesthesia:  Local    Additional requests/questions:    SignedRebeca Blight   12/17/2023, 2:33 PM

## 2023-12-23 DIAGNOSIS — T300 Burn of unspecified body region, unspecified degree: Secondary | ICD-10-CM | POA: Diagnosis not present

## 2023-12-27 DIAGNOSIS — L551 Sunburn of second degree: Secondary | ICD-10-CM | POA: Diagnosis not present

## 2023-12-27 DIAGNOSIS — E1040 Type 1 diabetes mellitus with diabetic neuropathy, unspecified: Secondary | ICD-10-CM | POA: Diagnosis not present

## 2023-12-27 DIAGNOSIS — I739 Peripheral vascular disease, unspecified: Secondary | ICD-10-CM | POA: Diagnosis not present

## 2024-01-07 DIAGNOSIS — Z4681 Encounter for fitting and adjustment of insulin pump: Secondary | ICD-10-CM | POA: Diagnosis not present

## 2024-01-07 DIAGNOSIS — Z794 Long term (current) use of insulin: Secondary | ICD-10-CM | POA: Diagnosis not present

## 2024-01-07 DIAGNOSIS — I10 Essential (primary) hypertension: Secondary | ICD-10-CM | POA: Diagnosis not present

## 2024-01-07 DIAGNOSIS — E785 Hyperlipidemia, unspecified: Secondary | ICD-10-CM | POA: Diagnosis not present

## 2024-01-07 DIAGNOSIS — E1040 Type 1 diabetes mellitus with diabetic neuropathy, unspecified: Secondary | ICD-10-CM | POA: Diagnosis not present

## 2024-01-08 DIAGNOSIS — E1040 Type 1 diabetes mellitus with diabetic neuropathy, unspecified: Secondary | ICD-10-CM | POA: Diagnosis not present

## 2024-01-23 DIAGNOSIS — E1040 Type 1 diabetes mellitus with diabetic neuropathy, unspecified: Secondary | ICD-10-CM | POA: Diagnosis not present

## 2024-01-27 NOTE — Progress Notes (Unsigned)
 Ellouise Console, PA-C [address] [place], [place]  [address] Phone: [phone]   Gastroenterology Consultation  Referring Provider:     Janey Santos, MD Primary Care Physician:  Janey Santos, MD Primary Gastroenterologist:  Ellouise Console, PA-C / Norleen Kiang, MD  Reason for Consultation:     Repeat colonoscopy, gastroparesis        HPI:   ANGELIQUE CHEVALIER is a 58 y.o. y/o female referred for consultation & management  by Avva, Ravisankar, MD. She has history of colon polyps and is overdue for repeat colonoscopy.  Current Symptoms: She admits to multiple chronic GI symptoms including generalized abdominal pain, acid reflux, dysphagia, bloating, gas, nausea and vomiting, constipation, diarrhea, and hemorrhoids.  Denies rectal bleeding.  Her husband passed away in 2022-03-01 and she has had increased stress and GI symptoms since then.  She tried taking Reglan  10 mg 3 times daily for gastroparesis in 2020/03/01.  She does not think it was very helpful.  Denies adverse side effects.  She tries to adhere to a diabetic and gastroparesis diets.  Has insulin  pump managed by PCP Dr. Jude.  Recent A1c 9.4.  She is currently taking Prevacid  30 mg twice daily for acid reflux which helps.  07/2019 last EGD by Dr. Kiang (for nausea and vomiting): Normal.  No biopsies.  07/2019 last colonoscopy by Dr. Kiang: 5 tubular adenoma polyps (1 mm to 8 mm) removed.  No dysplasia.  Internal and external hemorrhoids.  Excellent prep.  3-year repeat (was due 07/2022).  08/2019 gastric emptying study showed gastroparesis: 7% emptied at 1 hr ( normal >= 10%) 54% emptied at 2 hr ( normal >= 40%) 70% emptied at 3 hr ( normal >= 70%) 79% emptied at 4 hr ( normal >= 90%)  PMH: COPD, PAD, sleep apnea.  Hypertension, type II diabetes (on insulin  pump), GERD, gastroparesis, history of esophageal stricture, rheumatoid arthritis, obesity, tobacco abuse.  Currently on aspirin  and Plavix  (Rx by Dr. Court).  Past Medical  History:  Diagnosis Date   Anxiety    COPD (chronic obstructive pulmonary disease) (HCC)    Depression    DOE (dyspnea on exertion)    Dyspnea    Gastroparesis    GERD (gastroesophageal reflux disease)    Glaucoma, both eyes    History of 03-01-18 novel coronavirus disease (COVID-19) 06/20/2020   hospital admission-- dx severe covid with pneumonia, ARDS, DKA, COPD exaberation;  pt intubated 01-13th and extubated 01-20th;   (11-15-2020  per pt did not go home on oxygen, residual generalized weakness and hair loss)   History of esophageal stricture    post dilatation, followed by dr kiang   History of Graves' disease    s/p RAI  03-03-2010   History of Helicobacter pylori infection    remote hx and treatment   History of kidney stones    Hyperlipidemia    Hypertension    followed by pcp   Hypothyroidism, postradioiodine therapy 02/2010   followed by pcp   IDA (iron deficiency anemia)    Insulin  pump in place    w/ Novolog  , managed by pcp   Moderate asthma    followed by pcp,  (11-15-2020  per pt has pulmonology appt on 11-17-2020))   OSA (obstructive sleep apnea)    11-15-2020  per pt has not used cpap since 05/ March 01, 2020  (previous followed by dr dohmeier,  study in epic 05-12-2018 moderate complex osa pt had used for awhile)  Osteoporosis    PAD (peripheral artery disease) (HCC)    followed by dr berry----  s/p bilateral common iliac artery angioplasty stenting for stenosis   Peripheral neuropathy    Pneumonia    covid 2022   PONV (postoperative nausea and vomiting)    Rheumatoid arthritis (HCC)    in both eyes   Right wrist fracture    Type 1 diabetes mellitus with long-term current use of insulin  (HCC)    per pt dx at age 61 approx,  followed by pcp,  (11-15-2020  per pt last A1c 7.2 in 04/ 2022),  checks blood sugar 6 to 10 times daily , fasting sugar-- 200 -- 300)   Vitamin D  deficiency     Past Surgical History:  Procedure Laterality Date   ABDOMINAL AORTOGRAM W/LOWER  EXTREMITY Bilateral 01/01/2019   Procedure: ABDOMINAL AORTOGRAM W/LOWER EXTREMITY;  Surgeon: Court Dorn PARAS, MD;  Location: [facility];  Service: Cardiovascular;  Laterality: Bilateral;   BREAST EXCISIONAL BIOPSY Left 04-*02-2000  @[facility]    per pt benign   BREAST SURGERY     CATARACT EXTRACTION W/ INTRAOCULAR LENS  IMPLANT, BILATERAL  11/2019   COLONOSCOPY  last one 2019 approx   CYSTOSCOPY WITH STENT PLACEMENT Right 07/22/2021   Procedure: CYSTOSCOPY WITH STENT PLACEMENT;  Surgeon: Francisca Redell BROCKS, MD;  Location: [facility];  Service: Urology;  Laterality: Right;   CYSTOSCOPY/URETEROSCOPY/HOLMIUM LASER/STENT PLACEMENT Right 08/11/2021   Procedure: CYSTOSCOPY/URETEROSCOPY/HOLMIUM LASER/STENT EXCHANGE;  Surgeon: Francisca Redell BROCKS, MD;  Location: [facility];  Service: Urology;  Laterality: Right;   FRACTURE SURGERY     LAPAROSCOPIC ASSISTED VAGINAL HYSTERECTOMY  06-22-2004 @[facility]    NASAL SEPTOPLASTY W/ TURBINOPLASTY Bilateral 09/07/2013   Procedure: SEPTOPLASTY, BILATERAL TURBINATE RESECTION ;  Surgeon: Ana LELON Moccasin, MD;  Location: [facility];  Service: ENT;  Laterality: Bilateral;   OPEN REDUCTION INTERNAL FIXATION (ORIF) DISTAL RADIAL FRACTURE Right 11/16/2020   Procedure: OPEN REDUCTION INTERNAL FIXATION (ORIF) DISTAL RADIAL FRACTURE;  Surgeon: Cristy Bonner DASEN, MD;  Location: [facility];  Service: Orthopedics;  Laterality: Right;   PERIPHERAL VASCULAR INTERVENTION Bilateral 01/01/2019   Procedure: PERIPHERAL VASCULAR INTERVENTION;  Surgeon: Court Dorn PARAS, MD;  Location: [facility];  Service: Cardiovascular;  Laterality: Bilateral;   UPPER GASTROINTESTINAL ENDOSCOPY  last one 07-23-2019  dr abran    Prior to Admission medications   Medication Sig Start Date End Date Taking? Authorizing Provider  ACCU-CHEK COMPACT PLUS test strip 1 each by Other route as needed.  10/20/12   [provider]  ACCU-CHEK SOFTCLIX LANCETS lancets  05/19/18   [provider]  acetaminophen  (TYLENOL ) 500 MG tablet Take 1,000 mg by mouth every 6 (six) hours as needed for moderate pain.    [provider]  albuterol  (PROVENTIL ) (2.5 MG/3ML) 0.083% nebulizer solution Take 3 mLs (2.5 mg total) by nebulization every 4 (four) hours as needed for wheezing or shortness of breath. 06/07/21   Brenna Adine CROME, DO  albuterol  (VENTOLIN  HFA) 108 (90 Base) MCG/ACT inhaler Inhale into the lungs. 03/01/23   [provider]  Albuterol  Sulfate, sensor, (PROAIR  DIGIHALER) 108 (90 Base) MCG/ACT AEPB Inhale 2 puffs into the lungs every 6 (six) hours as needed (shortness of breath).    [provider]  ALPRAZolam  (XANAX ) 0.5 MG tablet Take 0.5-1 mg by mouth 3 (three) times daily as needed for anxiety. 10/08/12   [provider]  AMBULATORY NON FORMULARY MEDICATION NOVOLOG  INSULIN  PUMP USES AS DIRECTED  [provider]  aspirin  EC 81 MG tablet Take 81 mg by mouth daily. Swallow whole.    [provider]  Blood Glucose Monitoring Suppl (ACCU-CHEK AVIVA PLUS) w/Device KIT  06/08/20   [provider]  budesonide  (PULMICORT ) 0.25 MG/2ML nebulizer solution Take 2 mLs (0.25 mg total) by nebulization 2 (two) times daily as needed (wheezing, dyspnea). 09/18/23   Parrett, Madelin RAMAN, NP  clopidogrel  (PLAVIX ) 75 MG tablet Take by mouth. 04/10/19   [provider]  Continuous Glucose Sensor (DEXCOM G7 SENSOR) MISC  08/08/23   [provider]  Fluocinolone Acetonide  0.18 MG IMPL by Intravitreal route. 01/30/22   [provider]  Fluticasone -Umeclidin-Vilant (TRELEGY ELLIPTA ) 100-62.5-25 MCG/ACT AEPB Inhale 1 puff into the lungs daily. 10/04/23   Parrett, Madelin RAMAN, NP  folic acid  (FOLVITE ) 1 MG tablet Take 1 tablet by mouth daily. 06/23/19   [provider]  Insulin  Human (INSULIN  PUMP) SOLN Inject into the skin. Novolog ----  basal rate--- 12 am - 2 am = 1.3, 2 am - 6 am = 1.4, 6 am - 3 pm = 1.8, 3 pm -  12 am = 1.55    [provider]  lansoprazole  (PREVACID ) 30 MG capsule Take 1 capsule (30 mg total) by mouth 2 (two) times daily before a meal. 12/13/20   Abran Norleen SAILOR, MD  latanoprost  (XALATAN ) 0.005 % ophthalmic solution Apply to eye. 02/05/23   [provider]  lidocaine  (XYLOCAINE ) 2 % solution SMARTSIG:15 Milliliter(s) By Mouth 3 Times Daily PRN 03/08/23   [provider]  methotrexate  (RHEUMATREX) 2.5 MG tablet Take 6 tablets (15 mg total) by mouth once a week. 3 tablets am and at bedtime twice a day once a week,  Saturday's Patient taking differently: Take 25 mg by mouth once a week. 5 tablets am and at bedtime twice a day once a week,  Saturday's 08/01/21   Cheryle Page, MD  metoprolol  succinate (TOPROL -XL) 50 MG 24 hr tablet Take 50 mg by mouth daily. Take with or immediately following a meal.    [provider]  montelukast  (SINGULAIR ) 10 MG tablet Take 10 mg by mouth at bedtime.    [provider]  NOVOLOG  100 UNIT/ML injection Inject into the skin See admin instructions. Pt has Insulin  pump 08/06/18   [provider]  pregabalin  (LYRICA ) 75 MG capsule Take 75-150 mg by mouth See admin instructions. Take 75 mg in the morning, 75 mg at lunch, and 150 mg at bedtime 07/24/18   [provider]  rosuvastatin  (CRESTOR ) 40 MG tablet Take 40 mg by mouth daily. 01/07/22   [provider]  sertraline  (ZOLOFT ) 100 MG tablet 2 po qd    [provider]  Vitamin D , Ergocalciferol , (DRISDOL ) 50000 UNITS CAPS Take 50,000 Units by mouth every Saturday.    [provider]    Family History  Problem Relation Age of Onset   Diabetes Father    Heart attack Father    Alcoholism Father    Lung cancer Mother    Liver cancer Mother    Irritable bowel syndrome Sister    Colon cancer Neg Hx    Esophageal cancer Neg Hx    Rectal cancer Neg Hx    Stomach cancer Neg Hx      Social History   Tobacco Use   Smoking status:  Former    Current packs/day: 0.00    Average packs/day: 0.5 packs/day for 40.0 years (20.0 ttl pk-yrs)  Types: Cigarettes    Start date: 46    Quit date: 06/20/2020    Years since quitting: 3.6   Smokeless tobacco: Never   Tobacco comments:    Started smoking at age [age]  Vaping Use   Vaping status: Never Used  Substance Use Topics   Alcohol  use: Yes    Comment: occasional   Drug use: No    Allergies as of 01/28/2024 - Review Complete 01/28/2024  Allergen Reaction Noted   Mirtazapine Hives and Rash 11/10/2019   Clindamycin Hives 02/28/2009   Macrobid [nitrofurantoin] Hives 06/07/2021   Other Hives 02/17/2018   Sulfonamide derivatives Hives    Alendronate sodium Itching 07/13/2021   Dorzolamide Other (See Comments) 03/18/2023   Fosamax [alendronate]  09/25/2021   Levocetirizine Other (See Comments) 03/18/2023   Lipitor [atorvastatin] Other (See Comments)    Restasis [cyclosporine]  02/17/2018   Sulfa antibiotics Hives 07/13/2021   Topamax [topiramate] Itching 11/17/2018    Review of Systems:    All systems reviewed and negative except where noted in HPI.   Physical Exam:  BP (!) 162/84   Pulse 85   Ht 4' 11 (1.499 m)   Wt 157 lb (71.2 kg)   BMI 31.71 kg/m  No LMP recorded. Patient has had a hysterectomy.  General:   Alert,  Well-developed, obese, well-nourished, pleasant and cooperative in NAD. Lungs:  Respirations even and unlabored.  Clear throughout to auscultation.   No wheezes, crackles, or rhonchi. No acute distress. Heart:  Regular rate and rhythm; no murmurs, clicks, rubs, or gallops. Abdomen:  Normal bowel sounds.  No bruits.  Soft, and obese without masses, hepatosplenomegaly or hernias noted.  There is moderate generalized abdominal pain throughout entire abdomen to light and deep palpation.   No guarding or rebound tenderness. No peritoneal signs. Neurologic:  Alert and oriented x3;  grossly normal neurologically. Psych:  Alert and cooperative.  Normal mood and affect.  Imaging Studies: No results found.  Labs: CBC    Component Value Date/Time   WBC 2.5 (L) 07/11/2023 1132   RBC 3.83 (L) 07/11/2023 1132   HGB 11.3 (L) 07/11/2023 1132   HGB 11.6 01/09/2019 1204   HCT 34.9 (L) 07/11/2023 1132   HCT 36.2 01/09/2019 1204   PLT 128 (L) 07/11/2023 1132   PLT 388 01/09/2019 1204   MCV 91.1 07/11/2023 1132   MCV 82 01/09/2019 1204    CMP     Component Value Date/Time   NA 134 (L) 07/11/2023 1132   NA 137 10/04/2021 1458   K 3.4 (L) 07/11/2023 1132   CL 97 (L) 07/11/2023 1132   CO2 23 07/11/2023 1132   GLUCOSE 119 (H) 07/11/2023 1132   BUN 22 (H) 07/11/2023 1132   BUN 14 10/04/2021 1458   CREATININE 1.11 (H) 07/11/2023 1132   CALCIUM  8.7 (L) 07/11/2023 1132   PROT 7.2 07/11/2023 1132   PROT 6.6 01/09/2019 1204   ALBUMIN 4.2 07/11/2023 1132   ALBUMIN 4.5 01/09/2019 1204   AST 26 07/11/2023 1132   ALT 20 07/11/2023 1132   ALKPHOS 59 07/11/2023 1132   BILITOT 0.9 07/11/2023 1132   BILITOT <0.2 01/09/2019 1204   GFRNONAA 58 (L) 07/11/2023 1132   GFRAA 93 01/30/2019 1558    Assessment and Plan:   KERLINE TRAHAN is a 58 y.o. y/o female has been referred for:  1.  Gastroparesis - Rx Reglan  5mg  TID before meals as needed. - Gastroparesis Diet: Low-fat, low fiber, small frequent meals. -  Encouraged tight control of blood sugars and diabetes through PCP.  2.  Chronic GERD with history of peptic stricture - Continue Prevacid  30 Mg twice daily. - Scheduling EGD I discussed risks of EGD with patient to include risk of bleeding, perforation, and risk of sedation.  Patient expressed understanding and agrees to proceed with EGD.   3.  Nausea / Vomiting - Scheduling EGD I discussed risks of EGD with patient to include risk of bleeding, perforation, and risk of sedation.  Patient expressed understanding and agrees to proceed with EGD.   4.  History of adenomatous colon polyps - Scheduling Colonoscopy I discussed  risks of colonoscopy with patient to include risk of bleeding, colon perforation, and risk of sedation.  Patient expressed understanding and agrees to proceed with colonoscopy.   5.  Comorbidities:  COPD, PAD, sleep apnea.  Hypertension, type II diabetes (on insulin ), GERD, gastroparesis, history of esophageal stricture, rheumatoid arthritis, obesity, tobacco abuse.  Currently on aspirin  and Plavix . - Requesting permission from Dr. Wadie, vascular, to hold Plavix  5 days prior to EGD and colonoscopy procedures.  Follow up as needed based on EGD and colonoscopy procedures and GI symptoms.  Ellouise Console, PA-C

## 2024-01-28 ENCOUNTER — Telehealth: Payer: Self-pay

## 2024-01-28 ENCOUNTER — Ambulatory Visit: Admitting: Physician Assistant

## 2024-01-28 ENCOUNTER — Encounter: Payer: Self-pay | Admitting: Physician Assistant

## 2024-01-28 ENCOUNTER — Telehealth: Payer: Self-pay | Admitting: Physician Assistant

## 2024-01-28 VITALS — BP 162/84 | HR 85 | Ht 59.0 in | Wt 157.0 lb

## 2024-01-28 DIAGNOSIS — R112 Nausea with vomiting, unspecified: Secondary | ICD-10-CM

## 2024-01-28 DIAGNOSIS — Z860100 Personal history of colon polyps, unspecified: Secondary | ICD-10-CM | POA: Diagnosis not present

## 2024-01-28 DIAGNOSIS — E1143 Type 2 diabetes mellitus with diabetic autonomic (poly)neuropathy: Secondary | ICD-10-CM | POA: Diagnosis not present

## 2024-01-28 DIAGNOSIS — K3184 Gastroparesis: Secondary | ICD-10-CM

## 2024-01-28 DIAGNOSIS — Z794 Long term (current) use of insulin: Secondary | ICD-10-CM

## 2024-01-28 DIAGNOSIS — K219 Gastro-esophageal reflux disease without esophagitis: Secondary | ICD-10-CM

## 2024-01-28 MED ORDER — METOCLOPRAMIDE HCL 5 MG PO TABS
5.0000 mg | ORAL_TABLET | Freq: Three times a day (TID) | ORAL | 2 refills | Status: AC
Start: 2024-01-28 — End: 2024-04-27

## 2024-01-28 MED ORDER — NA SULFATE-K SULFATE-MG SULF 17.5-3.13-1.6 GM/177ML PO SOLN: 1.0000 | Freq: Once | ORAL | 0 refills | Status: AC

## 2024-01-28 NOTE — Progress Notes (Signed)
 Noted

## 2024-01-28 NOTE — Telephone Encounter (Signed)
 Pt was seen today.

## 2024-01-28 NOTE — Patient Instructions (Addendum)
 Here is the link for gastroparesis diet  [email or web address]  We have sent the following medications to your pharmacy for you to pick up at your convenience: Reglan  5 mg three times daily   You have been scheduled for an Endoscopy and Colonoscopy. Please follow the written instructions given to you at your visit today.  If you use inhalers (even only as needed), please bring them with you on the day of your procedure.  DO NOT TAKE 7 DAYS PRIOR TO TEST- Trulicity (dulaglutide) Ozempic, Wegovy (semaglutide) Mounjaro (tirzepatide) Bydureon Bcise (exanatide extended release)  DO NOT TAKE 1 DAY PRIOR TO YOUR TEST Rybelsus (semaglutide) Adlyxin (lixisenatide) Victoza (liraglutide) Byetta (exanatide) ___________________________________________________________________________  Please follow up sooner if symptoms increase or worsen __________________________________________________________________________  Due to recent changes in healthcare laws, you may see the results of your imaging and laboratory studies on MyChart before your provider has had a chance to review them.  We understand that in some cases there may be results that are confusing or concerning to you. Not all laboratory results come back in the same time frame and the provider may be waiting for multiple results in order to interpret others.  Please give us  48 hours in order for your provider to thoroughly review all the results before contacting the office for clarification of your results.   Thank you for trusting me with your gastrointestinal care!   Ellouise Console, PA-C _______________________________________________________  If your blood pressure at your visit was 140/90 or greater, please contact your primary care physician to follow up on this.  _______________________________________________________  If you are age 70 or  older, your body mass index should be between 23-30. Your Body mass index is 31.71 kg/m. If this is out of the aforementioned range listed, please consider follow up with your Primary Care Provider.  If you are age 85 or younger, your body mass index should be between 19-25. Your Body mass index is 31.71 kg/m. If this is out of the aformentioned range listed, please consider follow up with your Primary Care Provider.   ________________________________________________________  The [facility] providers would like to encourage you to use MYCHART to communicate with providers for non-urgent requests or questions.  Due to long hold times on the telephone, sending your provider a message by [facility] may be a faster and more efficient way to get a response.  Please allow 48 business hours for a response.  Please remember that this is for non-urgent requests.  _______________________________________________________

## 2024-01-28 NOTE — Telephone Encounter (Signed)
 Patient called and stated that her su prep medication is going to cost her 100 dollars and she can't afford that at this time. Patient was wondering if we could possible provide her with a sample of the prep medication or prescribe her a less cost effect prep medication. Please advise.

## 2024-01-28 NOTE — Telephone Encounter (Signed)
 Insulin  pump letter

## 2024-01-28 NOTE — Telephone Encounter (Signed)
 [facility] Pre-operative Risk Assessment     Request for surgical clearance:     Endoscopy Procedure  What type of surgery is being performed?     Colonoscopy and Endoscopy   When is this surgery scheduled?     03/12/24  What type of clearance is required ?   Pharmacy  Are there any medications that need to be held prior to surgery and how long? Plavix  5 days  Practice name and name of physician performing surgery?      [facility]  What is your office phone and fax number?      Phone- [phone]  Fax- [phone]  Anesthesia type (None, local, MAC, general) ?       MAC   Please route your response to Alethea Blocker, CMA

## 2024-01-29 ENCOUNTER — Telehealth: Payer: Self-pay | Admitting: *Deleted

## 2024-01-29 NOTE — Telephone Encounter (Signed)
 Pt has been scheduled tele preop appt 02/24/24. Med rec and consent are done.

## 2024-01-29 NOTE — Telephone Encounter (Signed)
   Name: Ashley [name]  DOB: [birth year]  MRN: [id number]  Primary Cardiologist: Dorn Lesches, MD   Preoperative team, please contact this patient and set up a phone call appointment for further preoperative risk assessment. Please obtain consent and complete medication review. Thank you for your help.  I confirm that guidance regarding antiplatelet and oral anticoagulation therapy has been completed and, if necessary, noted below.  Per office protocol, if patient is without any new symptoms or concerns at the time of their virtual visit, she may hold Plavix  for 5 days prior to procedure. Please resume Plavix  as soon as possible postprocedure, at the discretion of the surgeon.    I also confirmed the patient resides in the state of [place] . As per [facility] Medical Board telemedicine laws, the patient must reside in the state in which the provider is licensed.   [name], NP 01/29/2024, 8:20 AM [facility]

## 2024-01-29 NOTE — Telephone Encounter (Signed)
 Pt has been scheduled tele preop appt 02/24/24. Med rec and consent are done.      Patient Consent for Virtual Visit        Ashley [name] has provided verbal consent on 01/29/2024 for a virtual visit (video or telephone).   CONSENT FOR VIRTUAL VISIT FOR:  Ashley [name]  By participating in this virtual visit I agree to the following:  I hereby voluntarily request, consent and authorize [facility] and its employed or contracted physicians, physician assistants, nurse practitioners or other licensed health care professionals (the Practitioner), to provide me with telemedicine health care services (the "Services) as deemed necessary by the treating Practitioner. I acknowledge and consent to receive the Services by the Practitioner via telemedicine. I understand that the telemedicine visit will involve communicating with the Practitioner through live audiovisual communication technology and the disclosure of certain medical information by electronic transmission. I acknowledge that I have been given the opportunity to request an in-person assessment or other available alternative prior to the telemedicine visit and am voluntarily participating in the telemedicine visit.  I understand that I have the right to withhold or withdraw my consent to the use of telemedicine in the course of my care at any time, without affecting my right to future care or treatment, and that the Practitioner or I may terminate the telemedicine visit at any time. I understand that I have the right to inspect all information obtained and/or recorded in the course of the telemedicine visit and may receive copies of available information for a reasonable fee.  I understand that some of the potential risks of receiving the Services via telemedicine include:  Delay or interruption in medical evaluation due to technological equipment failure or disruption; Information transmitted may not be sufficient (e.g. poor  resolution of images) to allow for appropriate medical decision making by the Practitioner; and/or  In rare instances, security protocols could fail, causing a breach of personal health information.  Furthermore, I acknowledge that it is my responsibility to provide information about my medical history, conditions and care that is complete and accurate to the best of my ability. I acknowledge that Practitioner's advice, recommendations, and/or decision may be based on factors not within their control, such as incomplete or inaccurate data provided by me or distortions of diagnostic images or specimens that may result from electronic transmissions. I understand that the practice of medicine is not an exact science and that Practitioner makes no warranties or guarantees regarding treatment outcomes. I acknowledge that a copy of this consent can be made available to me via my patient portal [facility] MyChart), or I can request a printed copy by calling the office of [facility].    I understand that my insurance will be billed for this visit.   I have read or had this consent read to me. I understand the contents of this consent, which adequately explains the benefits and risks of the Services being provided via telemedicine.  I have been provided ample opportunity to ask questions regarding this consent and the Services and have had my questions answered to my satisfaction. I give my informed consent for the services to be provided through the use of telemedicine in my medical care

## 2024-01-29 NOTE — Telephone Encounter (Signed)
   Name: Ashley [name]  DOB: [birth year]  MRN: [id number]  Primary Cardiologist: Dorn Lesches, MD   Preoperative team, please contact this patient and set up a phone call appointment for further preoperative risk assessment. Please obtain consent and complete medication review. Thank you for your help.  I confirm that guidance regarding antiplatelet and oral anticoagulation therapy has been completed and, if necessary, noted below.  Per office protocol, if patient is without any new symptoms or concerns at the time of their virtual visit, she may hold Plavix  for 5 days prior to procedure. Please resume Plavix  as soon as possible postprocedure, at the discretion of the surgeon.    I also confirmed the patient resides in the state of  . As per [facility] Medical Board telemedicine laws, the patient must reside in the state in which the provider is licensed.   [name], NP 01/29/2024, 8:19 AM [facility]

## 2024-01-30 MED ORDER — PROMETHAZINE HCL 25 MG PO TABS: 25.0000 mg | ORAL_TABLET | Freq: Four times a day (QID) | ORAL | 2 refills | Status: DC | PRN

## 2024-01-30 NOTE — Telephone Encounter (Signed)
 I spoke with the patient and she will come and pick up Suflave  and new instructions tomorrow.

## 2024-01-30 NOTE — Telephone Encounter (Signed)
===  View-only below this line=== ----- Message ----- From: [place], PA-C Sent: 01/29/2024   4:53 PM EDT To: Naomie LOISE Sharps, RN Subject: RE: Medication                                 Okay to Rx promethazine  25 Mg 1 tab every 6 hours as needed nausea and vomiting, #60, 2 refills. [place], PA-C

## 2024-02-06 ENCOUNTER — Ambulatory Visit: Admitting: Podiatry

## 2024-02-06 ENCOUNTER — Encounter: Payer: Self-pay | Admitting: Podiatry

## 2024-02-06 VITALS — Ht 59.0 in | Wt 157.0 lb

## 2024-02-06 DIAGNOSIS — M722 Plantar fascial fibromatosis: Secondary | ICD-10-CM

## 2024-02-06 DIAGNOSIS — S9030XA Contusion of unspecified foot, initial encounter: Secondary | ICD-10-CM

## 2024-02-06 NOTE — Patient Instructions (Signed)

## 2024-02-07 DIAGNOSIS — E1040 Type 1 diabetes mellitus with diabetic neuropathy, unspecified: Secondary | ICD-10-CM | POA: Diagnosis not present
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
## 2024-02-12 ENCOUNTER — Encounter: Payer: Self-pay | Admitting: Cardiovascular Disease

## 2024-02-12 MED ORDER — CLOPIDOGREL BISULFATE 75 MG PO TABS: 75.0000 mg | ORAL_TABLET | Freq: Every day | ORAL | 0 refills | Status: AC

## 2024-02-17 ENCOUNTER — Encounter: Payer: Self-pay | Admitting: Adult Health

## 2024-02-17 ENCOUNTER — Ambulatory Visit: Admitting: Adult Health

## 2024-02-17 VITALS — BP 147/88 | HR 71 | Temp 98.2°F | Ht 59.0 in | Wt 156.2 lb

## 2024-02-17 DIAGNOSIS — Z720 Tobacco use: Secondary | ICD-10-CM

## 2024-02-17 DIAGNOSIS — J449 Chronic obstructive pulmonary disease, unspecified: Secondary | ICD-10-CM | POA: Diagnosis not present

## 2024-02-17 DIAGNOSIS — R062 Wheezing: Secondary | ICD-10-CM | POA: Diagnosis not present

## 2024-02-17 DIAGNOSIS — Z87891 Personal history of nicotine dependence: Secondary | ICD-10-CM

## 2024-02-17 DIAGNOSIS — J310 Chronic rhinitis: Secondary | ICD-10-CM

## 2024-02-17 DIAGNOSIS — J309 Allergic rhinitis, unspecified: Secondary | ICD-10-CM | POA: Diagnosis not present

## 2024-02-17 NOTE — Progress Notes (Signed)
 @Patient  ID: Ashley [name], female    DOB: February 27, 1966, 58 y.o.   MRN: [id number]  Chief Complaint  Patient presents with   COPD    Follow up copd/asthma    Referring provider: Janey Santos, MD  HPI: 58 yo female former smoker followed for COPD with Asthma Medical significant for GERD, gastroparesis, type 1 diabetes, rheumatoid arthritis on methotrexate  COVID-19 infection with COVID-pneumonia in January 2022 requiring hospitalization complicated by acute respiratory failure requiring vent support, long recovery requiring prolonged oxygen use History of sleep apnea-CPAP intolerant followed by neurology Participates in the lung cancer CT chest screening program.  TEST/EVENTS :  PFTs February 02, 2021 showed normal lung function with FEV1 at 83%, ratio 87, FVC 75%, no significant bronchodilator response, DLCO 80%.   Lung cancer CT chest February 08, 2021 showed mild emphysema, left upper lobe nodule measuring 1.9 mm, lung RADS 2   Allergy panel September 07, 2021 negative, IgE 6, eosinophils 200  Allergies  Allergen Reactions   Mirtazapine Hives and Rash   Clindamycin Hives   Macrobid [Nitrofurantoin] Hives   Other Hives    Tylox    Sulfonamide Derivatives Hives   Alendronate Sodium Itching   Dorzolamide Other (See Comments)   Fosamax [Alendronate]     Other reaction(s): HIVES   Levocetirizine Other (See Comments)   Lipitor [Atorvastatin] Other (See Comments)    MUSCLE ACHES   Restasis [Cyclosporine]     Pt stated, My eyes turned red on the inside and outside of eye; burning sensation   Sulfa Antibiotics Hives   Topamax [Topiramate] Itching    Immunization History  Administered Date(s) Administered   Influenza Split 03/25/2002, 03/07/2011, 06/15/2011, 04/11/2012, 02/25/2013, 06/30/2013, 03/17/2014   Influenza, Quadrivalent, Recombinant, Inj, Pf 02/18/2018, 02/26/2019, 04/08/2020, 04/25/2021, 03/12/2023   Influenza-Unspecified 03/06/2017, 05/01/2022   Moderna  Sars-Covid-2 Vaccination 05/03/2020, 05/19/2020, 01/21/2021   PNEUMOCOCCAL CONJUGATE-20 02/16/2021, 10/25/2021   Pneumococcal Polysaccharide-23 03/25/2002, 02/25/2013, 02/16/2021   Tdap 06/17/2012   Unspecified SARS-COV-2 Vaccination 05/19/2020, 11/21/2020   Zoster, Live 06/30/2013    Past Medical History:  Diagnosis Date   Anxiety    COPD (chronic obstructive pulmonary disease) (HCC)    Depression    DOE (dyspnea on exertion)    Dyspnea    Gastroparesis    GERD (gastroesophageal reflux disease)    Glaucoma, both eyes    History of 2019 novel coronavirus disease (COVID-19) 06/20/2020   hospital admission-- dx severe covid with pneumonia, ARDS, DKA, COPD exaberation;  pt intubated 01-13th and extubated 01-20th;   (11-15-2020  per pt did not go home on oxygen, residual generalized weakness and hair loss)   History of esophageal stricture    post dilatation, followed by dr abran   History of Graves' disease    s/p RAI  03-03-2010   History of Helicobacter pylori infection    remote hx and treatment   History of kidney stones    Hyperlipidemia    Hypertension    followed by pcp   Hypothyroidism, postradioiodine therapy 02/2010   followed by pcp   IDA (iron deficiency anemia)    Insulin  pump in place    w/ Novolog  , managed by pcp   Moderate asthma    followed by pcp,  (11-15-2020  per pt has pulmonology appt on 11-17-2020))   OSA (obstructive sleep apnea)    11-15-2020  per pt has not used cpap since 05/ 2021  (previous followed by dr dohmeier,  study in epic 05-12-2018 moderate complex osa pt  had used for awhile)   Osteoporosis    PAD (peripheral artery disease) (HCC)    followed by dr berry----  s/p bilateral common iliac artery angioplasty stenting for stenosis   Peripheral neuropathy    Pneumonia    covid 2022   PONV (postoperative nausea and vomiting)    Rheumatoid arthritis (HCC)    in both eyes   Right wrist fracture    Type 1 diabetes mellitus with long-term  current use of insulin  (HCC)    per pt dx at age 89 approx,  followed by pcp,  (11-15-2020  per pt last A1c 7.2 in 04/ 2022),  checks blood sugar 6 to 10 times daily , fasting sugar-- 200 -- 300)   Vitamin D  deficiency     Tobacco History: Social History   Tobacco Use  Smoking Status Former   Current packs/day: 0.00   Average packs/day: 0.5 packs/day for 39.0 years (19.5 ttl pk-yrs)   Types: Cigarettes   Start date: 66   Quit date: 06/21/2019   Years since quitting: 4.6   Passive exposure: Past  Smokeless Tobacco Never  Tobacco Comments   Started smoking at age [age]   Counseling given: Not Answered Tobacco comments: Started smoking at age [age]   Outpatient Medications Prior to Visit  Medication Sig Dispense Refill   ACCU-CHEK COMPACT PLUS test strip 1 each by Other route as needed.      ACCU-CHEK SOFTCLIX LANCETS lancets      acetaminophen  (TYLENOL ) 500 MG tablet Take 1,000 mg by mouth every 6 (six) hours as needed for moderate pain.     albuterol  (PROVENTIL ) (2.5 MG/3ML) 0.083% nebulizer solution Take 3 mLs (2.5 mg total) by nebulization every 4 (four) hours as needed for wheezing or shortness of breath. 75 mL 3   albuterol  (VENTOLIN  HFA) 108 (90 Base) MCG/ACT inhaler Inhale into the lungs.     ALPRAZolam  (XANAX ) 0.5 MG tablet Take 0.5-1 mg by mouth 3 (three) times daily as needed for anxiety.     AMBULATORY NON FORMULARY MEDICATION NOVOLOG  INSULIN  PUMP USES AS DIRECTED     aspirin  EC 81 MG tablet Take 81 mg by mouth daily. Swallow whole.     Blood Glucose Monitoring Suppl (ACCU-CHEK AVIVA PLUS) w/Device KIT      budesonide  (PULMICORT ) 0.25 MG/2ML nebulizer solution Take 2 mLs (0.25 mg total) by nebulization 2 (two) times daily as needed (wheezing, dyspnea). 360 mL 1   clopidogrel  (PLAVIX ) 75 MG tablet Take 1 tablet (75 mg total) by mouth daily. 90 tablet 0   Continuous Glucose Sensor (DEXCOM G7 SENSOR) MISC      Fluocinolone Acetonide  0.18 MG IMPL by Intravitreal route.      folic acid  (FOLVITE ) 1 MG tablet Take 1 tablet by mouth daily.     Insulin  Human (INSULIN  PUMP) SOLN Inject into the skin. Novolog ----  basal rate--- 12 am - 2 am = 1.3, 2 am - 6 am = 1.4, 6 am - 3 pm = 1.8, 3 pm - 12 am = 1.55     lansoprazole  (PREVACID ) 30 MG capsule Take 1 capsule (30 mg total) by mouth 2 (two) times daily before a meal. 180 capsule 1   latanoprost  (XALATAN ) 0.005 % ophthalmic solution Apply to eye.     levothyroxine  (SYNTHROID ) 137 MCG tablet Take 137 mcg by mouth daily.     methotrexate  (RHEUMATREX) 2.5 MG tablet Take 6 tablets (15 mg total) by mouth once a week. 3 tablets am and at bedtime twice  a day once a week,  Saturday's (Patient taking differently: Take 25 mg by mouth once a week. 5 tablets am and at bedtime twice a day once a week,  Saturday's)     metoCLOPramide  (REGLAN ) 5 MG tablet Take 1 tablet (5 mg total) by mouth 3 (three) times daily before meals. 90 tablet 2   metoprolol  succinate (TOPROL -XL) 50 MG 24 hr tablet Take 50 mg by mouth daily. Take with or immediately following a meal.     montelukast  (SINGULAIR ) 10 MG tablet Take 10 mg by mouth at bedtime.     Na Sulfate-K Sulfate-Mg Sulfate concentrate (SUPREP) 17.5-3.13-1.6 GM/177ML SOLN SMARTSIG:1 Kit(s) By Mouth Once     NOVOLOG  100 UNIT/ML injection Inject into the skin See admin instructions. Pt has Insulin  pump     pregabalin  (LYRICA ) 75 MG capsule Take 75-150 mg by mouth See admin instructions. Take 75 mg in the morning, 75 mg at lunch, and 150 mg at bedtime     promethazine  (PHENERGAN ) 25 MG tablet Take 1 tablet (25 mg total) by mouth every 6 (six) hours as needed for nausea or vomiting. 60 tablet 2   rosuvastatin  (CRESTOR ) 40 MG tablet Take 40 mg by mouth daily.     sertraline  (ZOLOFT ) 100 MG tablet 2 po qd     Vitamin D , Ergocalciferol , (DRISDOL ) 50000 UNITS CAPS Take 50,000 Units by mouth every Saturday.     Fluticasone -Umeclidin-Vilant (TRELEGY ELLIPTA ) 100-62.5-25 MCG/ACT AEPB Inhale 1 puff into the  lungs daily. (Patient not taking: Reported on 02/17/2024) 1 each 11   No facility-administered medications prior to visit.     Review of Systems:   Constitutional:   No  weight loss, night sweats,  Fevers, chills, fatigue, or  lassitude.  HEENT:   No headaches,  Difficulty swallowing,  Tooth/dental problems, or  Sore throat,                No sneezing, itching, ear ache, nasal congestion, post nasal drip,   CV:  No chest pain,  Orthopnea, PND, swelling in lower extremities, anasarca, dizziness, palpitations, syncope.   GI  No heartburn, indigestion, abdominal pain, nausea, vomiting, diarrhea, change in bowel habits, loss of appetite, bloody stools.   Resp: No shortness of breath with exertion or at rest.  No excess mucus, no productive cough,  No non-productive cough,  No coughing up of blood.  No change in color of mucus.  No wheezing.  No chest wall deformity  Skin: no rash or lesions.  GU: no dysuria, change in color of urine, no urgency or frequency.  No flank pain, no hematuria   MS:  No joint pain or swelling.  No decreased range of motion.  No back pain.    Physical Exam  BP (!) 147/88   Pulse 71   Temp 98.2 F (36.8 C) (Oral)   Ht 4' 11 (1.499 m)   Wt 156 lb 3.2 oz (70.9 kg)   SpO2 98%   BMI 31.55 kg/m   GEN: A/Ox3; pleasant , NAD, well nourished    HEENT:  [place]/AT,  EACs-clear, TMs-wnl, NOSE-clear, THROAT-clear, no lesions, no postnasal drip or exudate noted.   NECK:  Supple w/ fair ROM; no JVD; normal carotid impulses w/o bruits; no thyromegaly or nodules palpated; no lymphadenopathy.    RESP  Clear  P & A; w/o, wheezes/ rales/ or rhonchi. no accessory muscle use, no dullness to percussion  CARD:  RRR, no m/r/g, no peripheral edema, pulses intact, no cyanosis or  clubbing.  GI:   Soft & nt; nml bowel sounds; no organomegaly or masses detected.   Musco: Warm bil, no deformities or joint swelling noted.   Neuro: alert, no focal deficits noted.    Skin: Warm,  no lesions or rashes    Lab Results:  CBC    Component Value Date/Time   WBC 2.5 (L) 07/11/2023 1132   RBC 3.83 (L) 07/11/2023 1132   HGB 11.3 (L) 07/11/2023 1132   HGB 11.6 01/09/2019 1204   HCT 34.9 (L) 07/11/2023 1132   HCT 36.2 01/09/2019 1204   PLT 128 (L) 07/11/2023 1132   PLT 388 01/09/2019 1204   MCV 91.1 07/11/2023 1132   MCV 82 01/09/2019 1204   MCH 29.5 07/11/2023 1132   MCHC 32.4 07/11/2023 1132   RDW 14.5 07/11/2023 1132   RDW 13.5 01/09/2019 1204   LYMPHSABS 1.3 03/04/2022 2047   MONOABS 0.7 03/04/2022 2047   EOSABS 0.1 03/04/2022 2047   BASOSABS 0.0 03/04/2022 2047    BMET    Component Value Date/Time   NA 134 (L) 07/11/2023 1132   NA 137 10/04/2021 1458   K 3.4 (L) 07/11/2023 1132   CL 97 (L) 07/11/2023 1132   CO2 23 07/11/2023 1132   GLUCOSE 119 (H) 07/11/2023 1132   BUN 22 (H) 07/11/2023 1132   BUN 14 10/04/2021 1458   CREATININE 1.11 (H) 07/11/2023 1132   CALCIUM  8.7 (L) 07/11/2023 1132   GFRNONAA 58 (L) 07/11/2023 1132   GFRAA 93 01/30/2019 1558    BNP    Component Value Date/Time   BNP 128.8 (H) 06/21/2020 0046    ProBNP No results found for: PROBNP  Imaging: No results found.  Administration History     None          Latest Ref Rng & Units 02/14/2023    4:18 PM 02/02/2021   12:45 PM  PFT Results  FVC-Pre L 2.29  2.25   FVC-Predicted Pre % 75  76   FVC-Post L  2.22   FVC-Predicted Post %  75   Pre FEV1/FVC % % 85  85   Post FEV1/FCV % %  87   FEV1-Pre L 1.95  1.91   FEV1-Predicted Pre % 82  82   FEV1-Post L  1.94   DLCO uncorrected ml/min/mmHg 14.85  14.42   DLCO UNC% % 80  80   DLCO corrected ml/min/mmHg 15.62  14.84   DLCO COR %Predicted % 84  82   DLVA Predicted % 103  94   TLC L  3.89   TLC % Predicted %  87   RV % Predicted %  81     Lab Results  Component Value Date   NITRICOXIDE 15 04/26/2015        Assessment & Plan:   No problem-specific Assessment & Plan notes found for this  encounter.     Madelin Stank, NP 02/17/2024

## 2024-02-17 NOTE — Patient Instructions (Addendum)
 Restart Budesonide  neb Twice daily   Begin Brovana  neb Twice daily   Albuterol  inhaler or neb As needed   Continue on Singulair  10mg  daily  Zyrtec 10mg  At bedtime  As needed   Yearly CT chest this fall as planned.  Flu shot as planned this fall.  Follow up with Dr. Kara or Chaniqua Brisby NP in 4-6 months -30 min slot

## 2024-02-17 NOTE — Progress Notes (Signed)
 @Patient  ID: Ashley [name], female    DOB: 07/13/1965, 58 y.o.   MRN: [id number]  Chief Complaint  Patient presents with   COPD    Follow up copd/asthma    Referring provider: Avva, Ravisankar, MD  HPI: 58 year old female former smoker followed for COPD with asthma and emphysema Medical significant for GERD, gastroparesis, type 1 diabetes, rheumatoid arthritis on methotrexate  COVID-19 infection with COVID-pneumonia in January 2022 requiring hospitalization complicated by acute respiratory failure requiring vent support, long recovery requiring prolonged oxygen use History of sleep apnea-CPAP intolerant followed by neurology Participates in the lung cancer CT chest screening program.  TEST/EVENTS : Reviewed  PFTs February 02, 2021 showed normal lung function with FEV1 at 83%, ratio 87, FVC 75%, no significant bronchodilator response, DLCO 80%.   Lung cancer CT chest February 08, 2021 showed mild emphysema, left upper lobe nodule measuring 1.9 mm, lung RADS 2   Allergy panel September 07, 2021 negative, IgE 6, eosinophils 200  02/17/2024 Follow up: COPD with Asthma  Discussed the use of AI scribe software for clinical note transcription with the patient, who gave verbal consent to proceed.  History of Present Illness Ashley [name] is a 58 year old female with COPD with Asthma  Patient presents for a 40-month follow-up. She has been experiencing wheezing recently and has been unable to take her prescribed Trelegy due to its high cost, which is $200 for a three-month supply. Her father-in-law, who has the same insurance, receives it for free, leading to confusion about her coverage. She has been using albuterol  as needed and has plenty of Pulmicort  neb available, which she uses with a nebulizer when her symptoms worsen.  Over the summer, she experienced an incident where she fell asleep on the beach, resulting in low blood sugar and severe blisters on her heels from the hot sand. She  received treatment at an urgent care facility, including Silvadene cream, pain medication, and antibiotics. She followed up with her medical doctor and a foot doctor after the urgent care visit.  She is finally healed all the way up  Regarding her allergies, she started taking Zyrtec two weeks ago due to persistent nasal drainage. She also uses Flonase and takes Singulair  (montelukast ) at night. Her wheezing worsens when exposed to smoke outdoors.      Allergies  Allergen Reactions   Mirtazapine Hives and Rash   Clindamycin Hives   Macrobid [Nitrofurantoin] Hives   Other Hives    Tylox    Sulfonamide Derivatives Hives   Alendronate Sodium Itching   Dorzolamide Other (See Comments)   Fosamax [Alendronate]     Other reaction(s): HIVES   Levocetirizine Other (See Comments)   Lipitor [Atorvastatin] Other (See Comments)    MUSCLE ACHES   Restasis [Cyclosporine]     Pt stated, My eyes turned red on the inside and outside of eye; burning sensation   Sulfa Antibiotics Hives   Topamax [Topiramate] Itching    Immunization History  Administered Date(s) Administered   Influenza Split 03/25/2002, 03/07/2011, 06/15/2011, 04/11/2012, 02/25/2013, 06/30/2013, 03/17/2014   Influenza, Quadrivalent, Recombinant, Inj, Pf 02/18/2018, 02/26/2019, 04/08/2020, 04/25/2021, 03/12/2023   Influenza-Unspecified 03/06/2017, 05/01/2022   Moderna Sars-Covid-2 Vaccination 05/03/2020, 05/19/2020, 01/21/2021   PNEUMOCOCCAL CONJUGATE-20 02/16/2021, 10/25/2021   Pneumococcal Polysaccharide-23 03/25/2002, 02/25/2013, 02/16/2021   Tdap 06/17/2012   Unspecified SARS-COV-2 Vaccination 05/19/2020, 11/21/2020   Zoster, Live 06/30/2013    Past Medical History:  Diagnosis Date   Anxiety    COPD (chronic obstructive pulmonary disease) (HCC)  Depression    DOE (dyspnea on exertion)    Dyspnea    Gastroparesis    GERD (gastroesophageal reflux disease)    Glaucoma, both eyes    History of 2019 novel  coronavirus disease (COVID-19) 06/20/2020   hospital admission-- dx severe covid with pneumonia, ARDS, DKA, COPD exaberation;  pt intubated 01-13th and extubated 01-20th;   (11-15-2020  per pt did not go home on oxygen, residual generalized weakness and hair loss)   History of esophageal stricture    post dilatation, followed by dr abran   History of Graves' disease    s/p RAI  03-03-2010   History of Helicobacter pylori infection    remote hx and treatment   History of kidney stones    Hyperlipidemia    Hypertension    followed by pcp   Hypothyroidism, postradioiodine therapy 02/2010   followed by pcp   IDA (iron deficiency anemia)    Insulin  pump in place    w/ Novolog  , managed by pcp   Moderate asthma    followed by pcp,  (11-15-2020  per pt has pulmonology appt on 11-17-2020))   OSA (obstructive sleep apnea)    11-15-2020  per pt has not used cpap since 05/ 2021  (previous followed by dr chalice,  study in epic 05-12-2018 moderate complex osa pt had used for awhile)   Osteoporosis    PAD (peripheral artery disease) (HCC)    followed by dr berry----  s/p bilateral common iliac artery angioplasty stenting for stenosis   Peripheral neuropathy    Pneumonia    covid 2022   PONV (postoperative nausea and vomiting)    Rheumatoid arthritis (HCC)    in both eyes   Right wrist fracture    Type 1 diabetes mellitus with long-term current use of insulin  (HCC)    per pt dx at age 53 approx,  followed by pcp,  (11-15-2020  per pt last A1c 7.2 in 04/ 2022),  checks blood sugar 6 to 10 times daily , fasting sugar-- 200 -- 300)   Vitamin D  deficiency     Tobacco History: Social History   Tobacco Use  Smoking Status Former   Current packs/day: 0.00   Average packs/day: 0.5 packs/day for 39.0 years (19.5 ttl pk-yrs)   Types: Cigarettes   Start date: 34   Quit date: 06/21/2019   Years since quitting: 4.6   Passive exposure: Past  Smokeless Tobacco Never  Tobacco Comments   Started  smoking at age [age]   Counseling given: Not Answered Tobacco comments: Started smoking at age [age]   Outpatient Medications Prior to Visit  Medication Sig Dispense Refill   ACCU-CHEK COMPACT PLUS test strip 1 each by Other route as needed.      ACCU-CHEK SOFTCLIX LANCETS lancets      acetaminophen  (TYLENOL ) 500 MG tablet Take 1,000 mg by mouth every 6 (six) hours as needed for moderate pain.     albuterol  (PROVENTIL ) (2.5 MG/3ML) 0.083% nebulizer solution Take 3 mLs (2.5 mg total) by nebulization every 4 (four) hours as needed for wheezing or shortness of breath. 75 mL 3   albuterol  (VENTOLIN  HFA) 108 (90 Base) MCG/ACT inhaler Inhale into the lungs.     ALPRAZolam  (XANAX ) 0.5 MG tablet Take 0.5-1 mg by mouth 3 (three) times daily as needed for anxiety.     AMBULATORY NON FORMULARY MEDICATION NOVOLOG  INSULIN  PUMP USES AS DIRECTED     aspirin  EC 81 MG tablet Take 81 mg by mouth  daily. Swallow whole.     Blood Glucose Monitoring Suppl (ACCU-CHEK AVIVA PLUS) w/Device KIT      budesonide  (PULMICORT ) 0.25 MG/2ML nebulizer solution Take 2 mLs (0.25 mg total) by nebulization 2 (two) times daily as needed (wheezing, dyspnea). 360 mL 1   clopidogrel  (PLAVIX ) 75 MG tablet Take 1 tablet (75 mg total) by mouth daily. 90 tablet 0   Continuous Glucose Sensor (DEXCOM G7 SENSOR) MISC      Fluocinolone Acetonide  0.18 MG IMPL by Intravitreal route.     folic acid  (FOLVITE ) 1 MG tablet Take 1 tablet by mouth daily.     Insulin  Human (INSULIN  PUMP) SOLN Inject into the skin. Novolog ----  basal rate--- 12 am - 2 am = 1.3, 2 am - 6 am = 1.4, 6 am - 3 pm = 1.8, 3 pm - 12 am = 1.55     lansoprazole  (PREVACID ) 30 MG capsule Take 1 capsule (30 mg total) by mouth 2 (two) times daily before a meal. 180 capsule 1   latanoprost  (XALATAN ) 0.005 % ophthalmic solution Apply to eye.     levothyroxine  (SYNTHROID ) 137 MCG tablet Take 137 mcg by mouth daily.     methotrexate  (RHEUMATREX) 2.5 MG tablet Take 6 tablets (15 mg  total) by mouth once a week. 3 tablets am and at bedtime twice a day once a week,  Saturday's (Patient taking differently: Take 25 mg by mouth once a week. 5 tablets am and at bedtime twice a day once a week,  Saturday's)     metoCLOPramide  (REGLAN ) 5 MG tablet Take 1 tablet (5 mg total) by mouth 3 (three) times daily before meals. 90 tablet 2   metoprolol  succinate (TOPROL -XL) 50 MG 24 hr tablet Take 50 mg by mouth daily. Take with or immediately following a meal.     montelukast  (SINGULAIR ) 10 MG tablet Take 10 mg by mouth at bedtime.     Na Sulfate-K Sulfate-Mg Sulfate concentrate (SUPREP) 17.5-3.13-1.6 GM/177ML SOLN SMARTSIG:1 Kit(s) By Mouth Once     NOVOLOG  100 UNIT/ML injection Inject into the skin See admin instructions. Pt has Insulin  pump     pregabalin  (LYRICA ) 75 MG capsule Take 75-150 mg by mouth See admin instructions. Take 75 mg in the morning, 75 mg at lunch, and 150 mg at bedtime     promethazine  (PHENERGAN ) 25 MG tablet Take 1 tablet (25 mg total) by mouth every 6 (six) hours as needed for nausea or vomiting. 60 tablet 2   rosuvastatin  (CRESTOR ) 40 MG tablet Take 40 mg by mouth daily.     sertraline  (ZOLOFT ) 100 MG tablet 2 po qd     Vitamin D , Ergocalciferol , (DRISDOL ) 50000 UNITS CAPS Take 50,000 Units by mouth every Saturday.     Fluticasone -Umeclidin-Vilant (TRELEGY ELLIPTA ) 100-62.5-25 MCG/ACT AEPB Inhale 1 puff into the lungs daily. (Patient not taking: Reported on 02/17/2024) 1 each 11   No facility-administered medications prior to visit.     Review of Systems:   Constitutional:   No  weight loss, night sweats,  Fevers, chills, fatigue, or  lassitude.  HEENT:   No headaches,  Difficulty swallowing,  Tooth/dental problems, or  Sore throat,                No sneezing, itching, ear ache,+ nasal congestion, post nasal drip,   CV:  No chest pain,  Orthopnea, PND, swelling in lower extremities, anasarca, dizziness, palpitations, syncope.   GI  No heartburn, indigestion,  abdominal pain, nausea, vomiting, diarrhea, change  in bowel habits, loss of appetite, bloody stools.   Resp: No shortness of breath with exertion or at rest.  No excess mucus, no productive cough,  No non-productive cough,  No coughing up of blood.  No change in color of mucus.  No wheezing.  No chest wall deformity  Skin: no rash or lesions.  GU: no dysuria, change in color of urine, no urgency or frequency.  No flank pain, no hematuria   MS:  No joint pain or swelling.  No decreased range of motion.  No back pain.    Physical Exam  BP (!) 147/88   Pulse 71   Temp 98.2 F (36.8 C) (Oral)   Ht 4' 11 (1.499 m)   Wt 156 lb 3.2 oz (70.9 kg)   SpO2 98%   BMI 31.55 kg/m   GEN: A/Ox3; pleasant , NAD, well nourished    HEENT:  [place]/AT,   NOSE-clear, THROAT-clear, no lesions, no postnasal drip or exudate noted.   NECK:  Supple w/ fair ROM; no JVD; normal carotid impulses w/o bruits; no thyromegaly or nodules palpated; no lymphadenopathy.    RESP  Clear  P & A; w/o, wheezes/ rales/ or rhonchi. no accessory muscle use, no dullness to percussion  CARD:  RRR, no m/r/g, no peripheral edema, pulses intact, no cyanosis or clubbing.  GI:   Soft & nt; nml bowel sounds; no organomegaly or masses detected.   Musco: Warm bil, no deformities or joint swelling noted.   Neuro: alert, no focal deficits noted.    Skin: Warm, no lesions or rashes    Lab Results:  CBC     BNP  ProBNP No results found for: PROBNP  Imaging: No results found.  Administration History     None          Latest Ref Rng & Units 02/14/2023    4:18 PM 02/02/2021   12:45 PM  PFT Results  FVC-Pre L 2.29  2.25   FVC-Predicted Pre % 75  76   FVC-Post L  2.22   FVC-Predicted Post %  75   Pre FEV1/FVC % % 85  85   Post FEV1/FCV % %  87   FEV1-Pre L 1.95  1.91   FEV1-Predicted Pre % 82  82   FEV1-Post L  1.94   DLCO uncorrected ml/min/mmHg 14.85  14.42   DLCO UNC% % 80  80   DLCO corrected  ml/min/mmHg 15.62  14.84   DLCO COR %Predicted % 84  82   DLVA Predicted % 103  94   TLC L  3.89   TLC % Predicted %  87   RV % Predicted %  81     Lab Results  Component Value Date   NITRICOXIDE 15 04/26/2015        Assessment & Plan:   No problem-specific Assessment & Plan notes found for this encounter.  Assessment and Plan Assessment & Plan Chronic obstructive pulmonary disease (COPD) with wheezing   Unable to afford Trelegy inhaler. Restart budesonide  via nebulizer twice daily. Initiate Brovana  via nebulizer and send the prescription to [facility]. Ensure albuterol  is available for use as needed.   Allergic rhinitis   Allergic rhinitis presents with nasal congestion and rhinorrhea, worsened by environmental triggers like smoke. Continue Zyrtec as needed for allergy symptoms. Continue Flonase for nasal congestion.  Tobacco abuse history participates in lung cancer CT screening program.  Upcoming CT is scheduled  DM -follow up with PCP  Plan  Patient Instructions  Restart Budesonide  neb Twice daily   Begin Brovana  neb Twice daily   Albuterol  inhaler or neb As needed   Continue on Singulair  10mg  daily  Zyrtec 10mg  At bedtime  As needed   Yearly CT chest this fall as planned.  Flu shot as planned this fall.  Follow up with Dr. Kara or Deaaron Fulghum NP in 4-6 months -30 min slot           Madelin Stank, NP 02/17/2024

## 2024-02-18 ENCOUNTER — Encounter: Payer: Self-pay | Admitting: Acute Care

## 2024-02-18 ENCOUNTER — Telehealth: Payer: Self-pay | Admitting: *Deleted

## 2024-02-18 MED ORDER — ARFORMOTEROL TARTRATE 15 MCG/2ML IN NEBU: 15.0000 ug | INHALATION_SOLUTION | Freq: Two times a day (BID) | RESPIRATORY_TRACT | 6 refills | Status: AC

## 2024-02-18 NOTE — Telephone Encounter (Signed)
 Please send Brovana  to [facility] per yesterday's visit.      Instructions  Restart Budesonide  neb Twice daily   Begin Brovana  neb Twice daily   Albuterol  inhaler or neb As needed   Continue on Singulair  10mg  daily  Zyrtec 10mg  At bedtime  As needed   Yearly CT chest this fall as planned.  Flu shot as planned this fall.  Follow up with Dr. Kara or Parrett NP in 4-6 months -30 min slot

## 2024-02-18 NOTE — Telephone Encounter (Signed)
 Rx sent.

## 2024-02-21 ENCOUNTER — Other Ambulatory Visit: Payer: Self-pay | Admitting: Acute Care

## 2024-02-21 ENCOUNTER — Inpatient Hospital Stay: Admission: RE | Admit: 2024-02-21 | Source: Ambulatory Visit

## 2024-02-21 DIAGNOSIS — Z87891 Personal history of nicotine dependence: Secondary | ICD-10-CM

## 2024-02-21 DIAGNOSIS — Z122 Encounter for screening for malignant neoplasm of respiratory organs: Secondary | ICD-10-CM

## 2024-02-24 ENCOUNTER — Ambulatory Visit: Attending: Internal Medicine

## 2024-02-24 ENCOUNTER — Encounter: Payer: Self-pay | Admitting: Physician Assistant

## 2024-02-24 ENCOUNTER — Telehealth: Payer: Self-pay

## 2024-02-24 DIAGNOSIS — Z01810 Encounter for preprocedural cardiovascular examination: Secondary | ICD-10-CM | POA: Diagnosis not present

## 2024-02-24 NOTE — Progress Notes (Signed)
 Virtual Visit via Telephone Note   Because of Ashley [name] co-morbid illnesses, she is at least at moderate risk for complications without adequate follow up.  This format is felt to be most appropriate for this patient at this time.  Due to technical limitations with video connection (technology), today's appointment will be conducted as an audio only telehealth visit, and Ashley SZYMBORSKI verbally agreed to proceed in this manner.   All issues noted in this document were discussed and addressed.  No physical exam could be performed with this format.  Evaluation Performed:  Preoperative cardiovascular risk assessment _____________   Date:  02/24/2024   Patient ID:  Ashley [name], DOB [birth year], MRN [id number] Patient Location:  Home Provider location:   Office  Primary Care Provider:  Janey Santos, MD Primary Cardiologist:  Dorn Lesches, MD  Chief Complaint / Patient Profile  58 y.o. y/o female with a h/o COPD, GERD, Graves' disease s/p RAI in 2011, history of H. pylori infection s/p treatment, hypertension, hyperlipidemia, type 1 diabetes mellitus and PAD status post peripheral angiography performed in 2020 revealing high-grade distal abdominal aortic stenosis, underwent bilateral PTA and covered stent with rebuilding of her aortic bifurcation by Dr. Lesches.  Who is pending colonoscopy/EGD and presents today for telephonic preoperative cardiovascular risk assessment. History of Present Illness  Ashley [name] is a 58 y.o. female who presents via audio/video conferencing for a telehealth visit today.  Pt was last seen in cardiology clinic on 04/03/2023 by Dr. Lesches.  At that time BUELAH RENNIE was doing well.  The patient is now pending procedure as outlined above. Since her last visit, she has remained stable from a cardiac standpoint. She endorses some mild shortness of breath related to history of COPD/asthma that is stable and unchanged. Today she denies chest pain,  lower extremity edema, fatigue, palpitations, melena, hematuria, hemoptysis, diaphoresis, weakness, presyncope, syncope, orthopnea, and PND.  Her mobility is slightly limited as a result of plantar fasciitis and a burn on the bottom of her foot however she is still able to achieve greater than 4 METS of activity ADLs, ambulation around her house and completion of household chores. Past Medical History    Past Medical History:  Diagnosis Date   Anxiety    COPD (chronic obstructive pulmonary disease) (HCC)    Depression    DOE (dyspnea on exertion)    Dyspnea    Gastroparesis    GERD (gastroesophageal reflux disease)    Glaucoma, both eyes    History of 2019 novel coronavirus disease (COVID-19) 06/20/2020   hospital admission-- dx severe covid with pneumonia, ARDS, DKA, COPD exaberation;  pt intubated 01-13th and extubated 01-20th;   (11-15-2020  per pt did not go home on oxygen, residual generalized weakness and hair loss)   History of esophageal stricture    post dilatation, followed by dr abran   History of Graves' disease    s/p RAI  03-03-2010   History of Helicobacter pylori infection    remote hx and treatment   History of kidney stones    Hyperlipidemia    Hypertension    followed by pcp   Hypothyroidism, postradioiodine therapy 02/2010   followed by pcp   IDA (iron deficiency anemia)    Insulin  pump in place    w/ Novolog  , managed by pcp   Moderate asthma    followed by pcp,  (11-15-2020  per pt has pulmonology appt on 11-17-2020))   OSA (obstructive sleep apnea)  11-15-2020  per pt has not used cpap since 05/ 2021  (previous followed by dr dohmeier,  study in epic 05-12-2018 moderate complex osa pt had used for awhile)   Osteoporosis    PAD (peripheral artery disease) (HCC)    followed by dr berry----  s/p bilateral common iliac artery angioplasty stenting for stenosis   Peripheral neuropathy    Pneumonia    covid 2022   PONV (postoperative nausea and vomiting)     Rheumatoid arthritis (HCC)    in both eyes   Right wrist fracture    Type 1 diabetes mellitus with long-term current use of insulin  (HCC)    per pt dx at age 69 approx,  followed by pcp,  (11-15-2020  per pt last A1c 7.2 in 04/ 2022),  checks blood sugar 6 to 10 times daily , fasting sugar-- 200 -- 300)   Vitamin D  deficiency    Past Surgical History:  Procedure Laterality Date   ABDOMINAL AORTOGRAM W/LOWER EXTREMITY Bilateral 01/01/2019   Procedure: ABDOMINAL AORTOGRAM W/LOWER EXTREMITY;  Surgeon: Court Dorn PARAS, MD;  Location: [facility];  Service: Cardiovascular;  Laterality: Bilateral;   BREAST EXCISIONAL BIOPSY Left 04-*02-2000  @[facility]    per pt benign   BREAST SURGERY     CATARACT EXTRACTION W/ INTRAOCULAR LENS  IMPLANT, BILATERAL  11/2019   COLONOSCOPY  last one 2019 approx   CYSTOSCOPY WITH STENT PLACEMENT Right 07/22/2021   Procedure: CYSTOSCOPY WITH STENT PLACEMENT;  Surgeon: Francisca Redell BROCKS, MD;  Location: [facility];  Service: Urology;  Laterality: Right;   CYSTOSCOPY/URETEROSCOPY/HOLMIUM LASER/STENT PLACEMENT Right 08/11/2021   Procedure: CYSTOSCOPY/URETEROSCOPY/HOLMIUM LASER/STENT EXCHANGE;  Surgeon: Francisca Redell BROCKS, MD;  Location: [facility];  Service: Urology;  Laterality: Right;   FRACTURE SURGERY     LAPAROSCOPIC ASSISTED VAGINAL HYSTERECTOMY  06-22-2004 @[facility]    NASAL SEPTOPLASTY W/ TURBINOPLASTY Bilateral 09/07/2013   Procedure: SEPTOPLASTY, BILATERAL TURBINATE RESECTION ;  Surgeon: Ana LELON Moccasin, MD;  Location: [facility];  Service: ENT;  Laterality: Bilateral;   OPEN REDUCTION INTERNAL FIXATION (ORIF) DISTAL RADIAL FRACTURE Right 11/16/2020   Procedure: OPEN REDUCTION INTERNAL FIXATION (ORIF) DISTAL RADIAL FRACTURE;  Surgeon: Cristy Bonner DASEN, MD;  [facility];  Service: Orthopedics;  Laterality: Right;   PERIPHERAL VASCULAR INTERVENTION Bilateral 01/01/2019   Procedure: PERIPHERAL VASCULAR INTERVENTION;  Surgeon: Court Dorn PARAS,  MD;  Location: [facility];  Service: Cardiovascular;  Laterality: Bilateral;   UPPER GASTROINTESTINAL ENDOSCOPY  last one 07-23-2019  dr abran    Allergies  Allergies  Allergen Reactions   Mirtazapine Hives and Rash   Clindamycin Hives   Macrobid [Nitrofurantoin] Hives   Other Hives    Tylox    Sulfonamide Derivatives Hives   Alendronate Sodium Itching   Dorzolamide Other (See Comments)   Fosamax [Alendronate]     Other reaction(s): HIVES   Levocetirizine Other (See Comments)   Lipitor [Atorvastatin] Other (See Comments)    MUSCLE ACHES   Restasis [Cyclosporine]     Pt stated, My eyes turned red on the inside and outside of eye; burning sensation   Sulfa Antibiotics Hives   Topamax [Topiramate] Itching    Home Medications    Prior to Admission medications   Medication Sig Start Date End Date Taking? Authorizing Provider  ACCU-CHEK COMPACT PLUS test strip 1 each by Other route as needed.  10/20/12   [provider]  ACCU-CHEK SOFTCLIX LANCETS lancets  05/19/18   [provider]  acetaminophen  (  TYLENOL ) 500 MG tablet Take 1,000 mg by mouth every 6 (six) hours as needed for moderate pain.    [provider]  albuterol  (PROVENTIL ) (2.5 MG/3ML) 0.083% nebulizer solution Take 3 mLs (2.5 mg total) by nebulization every 4 (four) hours as needed for wheezing or shortness of breath. 06/07/21   Brenna Adine CROME, DO  albuterol  (VENTOLIN  HFA) 108 (90 Base) MCG/ACT inhaler Inhale into the lungs. 03/01/23   [provider]  ALPRAZolam  (XANAX ) 0.5 MG tablet Take 0.5-1 mg by mouth 3 (three) times daily as needed for anxiety. 10/08/12   [provider]  AMBULATORY NON FORMULARY MEDICATION NOVOLOG  INSULIN  PUMP USES AS DIRECTED    [provider]  arformoterol  (BROVANA ) 15 MCG/2ML NEBU Take 2 mLs (15 mcg total) by nebulization 2 (two) times daily. 02/18/24   Parrett, Madelin RAMAN, NP  aspirin  EC 81 MG tablet Take 81 mg by mouth daily. Swallow  whole.    [provider]  Blood Glucose Monitoring Suppl (ACCU-CHEK AVIVA PLUS) w/Device KIT  06/08/20   [provider]  budesonide  (PULMICORT ) 0.25 MG/2ML nebulizer solution Take 2 mLs (0.25 mg total) by nebulization 2 (two) times daily as needed (wheezing, dyspnea). 09/18/23   Parrett, Madelin RAMAN, NP  clopidogrel  (PLAVIX ) 75 MG tablet Take 1 tablet (75 mg total) by mouth daily. 02/12/24   Court Dorn PARAS, MD  Continuous Glucose Sensor (DEXCOM G7 SENSOR) MISC  08/08/23   [provider]  Fluocinolone Acetonide  0.18 MG IMPL by Intravitreal route. 01/30/22   [provider]  folic acid  (FOLVITE ) 1 MG tablet Take 1 tablet by mouth daily. 06/23/19   [provider]  Insulin  Human (INSULIN  PUMP) SOLN Inject into the skin. Novolog ----  basal rate--- 12 am - 2 am = 1.3, 2 am - 6 am = 1.4, 6 am - 3 pm = 1.8, 3 pm - 12 am = 1.55    [provider]  lansoprazole  (PREVACID ) 30 MG capsule Take 1 capsule (30 mg total) by mouth 2 (two) times daily before a meal. 12/13/20   Abran Norleen SAILOR, MD  latanoprost  (XALATAN ) 0.005 % ophthalmic solution Apply to eye. 02/05/23   [provider]  levothyroxine  (SYNTHROID ) 137 MCG tablet Take 137 mcg by mouth daily. 02/15/24   [provider]  methotrexate  (RHEUMATREX) 2.5 MG tablet Take 6 tablets (15 mg total) by mouth once a week. 3 tablets am and at bedtime twice a day once a week,  Saturday's Patient taking differently: Take 25 mg by mouth once a week. 5 tablets am and at bedtime twice a day once a week,  Saturday's 08/01/21   Cheryle Page, MD  metoCLOPramide  (REGLAN ) 5 MG tablet Take 1 tablet (5 mg total) by mouth 3 (three) times daily before meals. 01/28/24 04/27/24  [place], PA-C  metoprolol  succinate (TOPROL -XL) 50 MG 24 hr tablet Take 50 mg by mouth daily. Take with or immediately following a meal.    [provider]  montelukast  (SINGULAIR ) 10 MG tablet Take 10 mg by mouth at bedtime.     [provider]  Na Sulfate-K Sulfate-Mg Sulfate concentrate (SUPREP) 17.5-3.13-1.6 GM/177ML SOLN SMARTSIG:1 Kit(s) By Mouth Once 01/30/24   [provider]  NOVOLOG  100 UNIT/ML injection Inject into the skin See admin instructions. Pt has Insulin  pump 08/06/18   [provider]  pregabalin  (LYRICA ) 75 MG capsule Take 75-150 mg by mouth See admin instructions. Take 75 mg in the morning, 75 mg at lunch, and 150 mg  at bedtime 07/24/18   [provider]  promethazine  (PHENERGAN ) 25 MG tablet Take 1 tablet (25 mg total) by mouth every 6 (six) hours as needed for nausea or vomiting. 01/30/24   [place], PA-C  rosuvastatin  (CRESTOR ) 40 MG tablet Take 40 mg by mouth daily. 01/07/22   [provider]  sertraline  (ZOLOFT ) 100 MG tablet 2 po qd    [provider]  Vitamin D , Ergocalciferol , (DRISDOL ) 50000 UNITS CAPS Take 50,000 Units by mouth every Saturday.    [provider]    Physical Exam   Vital Signs:  Ashley [name] does not have vital signs available for review today. Given telephonic nature of communication, physical exam is limited. AAOx3. NAD. Normal affect.  Speech and respirations are unlabored. Accessory Clinical Findings  None Assessment & Plan    1.  Preoperative Cardiovascular Risk Assessment: Ms. Crothers perioperative risk of a major cardiac event is 0.9% according to the Revised Cardiac Risk Index (RCRI).  Therefore, she is at low risk for perioperative complications.   Her functional capacity is fair at 5.04 METs according to the Duke Activity Status Index (DASI). Recommendations: According to ACC/AHA guidelines, no further cardiovascular testing needed.  The patient may proceed to surgery at acceptable risk.   Antiplatelet and/or Anticoagulation Recommendations: Clopidogrel  (Plavix ) can be held for 5 days prior to her surgery and resumed as soon as possible post op.   The patient was advised that if she develops  new symptoms prior to surgery to contact our office to arrange for a follow-up visit, and she verbalized understanding.  A copy of this note will be routed to requesting surgeon.  Time:   Today, I have spent 11 minutes with the patient with telehealth technology discussing medical history, symptoms, and management plan.    Marquice Uddin D Jairo Bellew, NP  02/24/2024, 4:56 PM

## 2024-02-24 NOTE — Telephone Encounter (Signed)
 I spoke with the patient and told her we received a fax from Dr. Janey stating the following:   Temp Basal of 80% of current valve when doing clear liquid, No bolus when over 200 if low use sprite.  She is aware understands and will call back with any questions comments or concerns.

## 2024-02-28 ENCOUNTER — Ambulatory Visit
Admission: RE | Admit: 2024-02-28 | Discharge: 2024-02-28 | Disposition: A | Discharge status: Home / Self Care | Source: Ambulatory Visit | Attending: Acute Care | Admitting: Acute Care

## 2024-02-28 DIAGNOSIS — Z122 Encounter for screening for malignant neoplasm of respiratory organs: Secondary | ICD-10-CM

## 2024-02-28 DIAGNOSIS — Z87891 Personal history of nicotine dependence: Secondary | ICD-10-CM

## 2024-03-03 DIAGNOSIS — E109 Type 1 diabetes mellitus without complications: Secondary | ICD-10-CM | POA: Diagnosis not present

## 2024-03-03 DIAGNOSIS — H30033 Focal chorioretinal inflammation, peripheral, bilateral: Secondary | ICD-10-CM | POA: Diagnosis not present

## 2024-03-03 DIAGNOSIS — H3581 Retinal edema: Secondary | ICD-10-CM | POA: Diagnosis not present

## 2024-03-03 DIAGNOSIS — Z79899 Other long term (current) drug therapy: Secondary | ICD-10-CM | POA: Diagnosis not present

## 2024-03-03 DIAGNOSIS — Z961 Presence of intraocular lens: Secondary | ICD-10-CM | POA: Diagnosis not present

## 2024-03-09 ENCOUNTER — Other Ambulatory Visit: Payer: Self-pay

## 2024-03-09 DIAGNOSIS — Z87891 Personal history of nicotine dependence: Secondary | ICD-10-CM

## 2024-03-09 DIAGNOSIS — Z122 Encounter for screening for malignant neoplasm of respiratory organs: Secondary | ICD-10-CM

## 2024-03-12 ENCOUNTER — Encounter: Payer: Self-pay | Admitting: Internal Medicine

## 2024-03-12 ENCOUNTER — Ambulatory Visit (AMBULATORY_SURGERY_CENTER): Admitting: Internal Medicine

## 2024-03-12 VITALS — BP 118/72 | HR 83 | Temp 98.6°F | Resp 14 | Ht 59.0 in | Wt 157.0 lb

## 2024-03-12 DIAGNOSIS — D125 Benign neoplasm of sigmoid colon: Secondary | ICD-10-CM

## 2024-03-12 DIAGNOSIS — E1143 Type 2 diabetes mellitus with diabetic autonomic (poly)neuropathy: Secondary | ICD-10-CM | POA: Diagnosis not present

## 2024-03-12 DIAGNOSIS — K648 Other hemorrhoids: Secondary | ICD-10-CM | POA: Diagnosis not present

## 2024-03-12 DIAGNOSIS — K219 Gastro-esophageal reflux disease without esophagitis: Secondary | ICD-10-CM | POA: Diagnosis not present

## 2024-03-12 DIAGNOSIS — K449 Diaphragmatic hernia without obstruction or gangrene: Secondary | ICD-10-CM

## 2024-03-12 DIAGNOSIS — D122 Benign neoplasm of ascending colon: Secondary | ICD-10-CM | POA: Diagnosis not present

## 2024-03-12 DIAGNOSIS — K3184 Gastroparesis: Secondary | ICD-10-CM | POA: Diagnosis not present

## 2024-03-12 DIAGNOSIS — K635 Polyp of colon: Secondary | ICD-10-CM

## 2024-03-12 DIAGNOSIS — D123 Benign neoplasm of transverse colon: Secondary | ICD-10-CM

## 2024-03-12 DIAGNOSIS — Z860101 Personal history of adenomatous and serrated colon polyps: Secondary | ICD-10-CM | POA: Diagnosis not present

## 2024-03-12 DIAGNOSIS — Z1211 Encounter for screening for malignant neoplasm of colon: Secondary | ICD-10-CM

## 2024-03-12 DIAGNOSIS — R112 Nausea with vomiting, unspecified: Secondary | ICD-10-CM | POA: Diagnosis not present

## 2024-03-12 DIAGNOSIS — Z860100 Personal history of colon polyps, unspecified: Secondary | ICD-10-CM

## 2024-03-12 DIAGNOSIS — Z794 Long term (current) use of insulin: Secondary | ICD-10-CM

## 2024-03-12 MED ORDER — SODIUM CHLORIDE 0.9 % IV SOLN: 500.0000 mL | INTRAVENOUS | Status: DC

## 2024-03-12 NOTE — Progress Notes (Signed)
 Vss nad trans to pacu

## 2024-03-12 NOTE — Op Note (Signed)
 [facility] Patient Name: Ashley [name] Procedure Date: 03/12/2024 1:04 PM MRN: [id number] Endoscopist: Norleen SAILOR. Abran , MD, [id number] Age: 58 Referring MD:  Date of Birth: 1966/03/02 Gender: Female Account #: [id number] Procedure:                Colonoscopy with cold snare polypectomy x 2; biopsy                            polypectomy x 1 Indications:              High risk colon cancer surveillance: Personal                            history of multiple (3 or more) adenomas. Previous                            examination 2021 Medicines:                Monitored Anesthesia Care Procedure:                Pre-Anesthesia Assessment:                           - Prior to the procedure, a History and Physical                            was performed, and patient medications and                            allergies were reviewed. The patient's tolerance of                            previous anesthesia was also reviewed. The risks                            and benefits of the procedure and the sedation                            options and risks were discussed with the patient.                            All questions were answered, and informed consent                            was obtained. Prior Anticoagulants: The patient has                            taken Plavix  (clopidogrel ), last dose was 6 days                            prior to procedure. ASA Grade Assessment: III - A                            patient with severe systemic disease. After  reviewing the risks and benefits, the patient was                            deemed in satisfactory condition to undergo the                            procedure.                           After obtaining informed consent, the colonoscope                            was passed under direct vision. Throughout the                            procedure, the patient's blood pressure, pulse, and                             oxygen saturations were monitored continuously. The                            Olympus Scope SN: [id number] was introduced through                            the anus and advanced to the the cecum, identified                            by appendiceal orifice and ileocecal valve. The                            ileocecal valve, appendiceal orifice, and rectum                            were photographed. The quality of the bowel                            preparation was excellent. The colonoscopy was                            performed without difficulty. The patient tolerated                            the procedure well. The bowel preparation used was                            SUPREP via split dose instruction. Scope In: 1:32:16 PM Scope Out: 1:51:30 PM Scope Withdrawal Time: 0 hours 12 minutes 16 seconds  Total Procedure Duration: 0 hours 19 minutes 14 seconds  Findings:                 Two polyps were found in the sigmoid colon and                            ascending colon. The polyps were 2 to 4 mm in size.  These polyps were removed with a cold snare.                            Resection and retrieval were complete.                           A 1 mm polyp was found in the transverse colon. The                            polyp was removed with a jumbo cold forceps.                            Resection and retrieval were complete.                           Internal hemorrhoids were found during retroflexion.                           The exam was otherwise without abnormality on                            direct and retroflexion views. Complications:            No immediate complications. Estimated blood loss:                            None. Estimated Blood Loss:     Estimated blood loss: none. Impression:               - Two 2 to 4 mm polyps in the sigmoid colon and in                            the ascending colon, removed with a cold snare.                             Resected and retrieved.                           - One 1 mm polyp in the transverse colon, removed                            with a jumbo cold forceps. Resected and retrieved.                           - Internal hemorrhoids.                           - The examination was otherwise normal on direct                            and retroflexion views. Recommendation:           - Repeat colonoscopy in 5 years for surveillance.                           -  Resume Plavix  (clopidogrel ) today at prior dose.                           - Patient has a contact number available for                            emergencies. The signs and symptoms of potential                            delayed complications were discussed with the                            patient. Return to normal activities tomorrow.                            Written discharge instructions were provided to the                            patient.                           - Resume previous diet.                           - Continue present medications.                           - Await pathology results. Norleen SAILOR. Abran, MD 03/12/2024 1:57:37 PM This report has been signed electronically.

## 2024-03-12 NOTE — Progress Notes (Signed)
 Called to room to assist during endoscopic procedure.  Patient ID and intended procedure confirmed with present staff. Received instructions for my participation in the procedure from the performing physician.

## 2024-03-12 NOTE — Patient Instructions (Signed)
-  Handout on polyps and hemorrhoids provided -Await pathology results -You can resume your Plavix  today  YOU HAD AN ENDOSCOPIC PROCEDURE TODAY AT THE [facility]:   Refer to the procedure report that was given to you for any specific questions about what was found during the examination.  If the procedure report does not answer your questions, please call your gastroenterologist to clarify.  If you requested that your care partner not be given the details of your procedure findings, then the procedure report has been included in a sealed envelope for you to review at your convenience later.  YOU SHOULD EXPECT: Some feelings of bloating in the abdomen. Passage of more gas than usual.  Walking can help get rid of the air that was put into your GI tract during the procedure and reduce the bloating. If you had a lower endoscopy (such as a colonoscopy or flexible sigmoidoscopy) you may notice spotting of blood in your stool or on the toilet paper. If you underwent a bowel prep for your procedure, you may not have a normal bowel movement for a few days.  Please Note:  You might notice some irritation and congestion in your nose or some drainage.  This is from the oxygen used during your procedure.  There is no need for concern and it should clear up in a day or so.  SYMPTOMS TO REPORT IMMEDIATELY:  Following lower endoscopy (colonoscopy or flexible sigmoidoscopy):  Excessive amounts of blood in the stool  Significant tenderness or worsening of abdominal pains  Swelling of the abdomen that is new, acute  Fever of 100F or higher  Following upper endoscopy (EGD)  Vomiting of blood or coffee ground material  New chest pain or pain under the shoulder blades  Painful or persistently difficult swallowing  New shortness of breath  Fever of 100F or higher  Black, tarry-looking stools  For urgent or emergent issues, a gastroenterologist can be reached at any hour by calling [phone]  [phone]. Do not use MyChart messaging for urgent concerns.    DIET:  We do recommend a small meal at first, but then you may proceed to your regular diet.  Drink plenty of fluids but you should avoid alcoholic beverages for 24 hours.  ACTIVITY:  You should plan to take it easy for the rest of today and you should NOT DRIVE or use heavy machinery until tomorrow (because of the sedation medicines used during the test).    FOLLOW UP: Our staff will call the number listed on your records the next business day following your procedure.  We will call around 7:15- 8:00 am to check on you and address any questions or concerns that you may have regarding the information given to you following your procedure. If we do not reach you, we will leave a message.     If any biopsies were taken you will be contacted by phone or by letter within the next 1-3 weeks.  Please call us  at [phone] if you have not heard about the biopsies in 3 weeks.    SIGNATURES/CONFIDENTIALITY: You and/or your care partner have signed paperwork which will be entered into your electronic medical record.  These signatures attest to the fact that that the information above on your After Visit Summary has been reviewed and is understood.  Full responsibility of the confidentiality of this discharge information lies with you and/or your care-partner.

## 2024-03-12 NOTE — Op Note (Signed)
  [facility] Patient Name: Ashley [name] Procedure Date: 03/12/2024 1:04 PM MRN: [id number] Endoscopist: Norleen SAILOR. Abran , MD, [id number] Age: 58 Referring MD:  Date of Birth: Aug 02, 1965 Gender: Female Account #: [id number] Procedure:                Upper GI endoscopy Indications:              Esophageal reflux, Nausea with vomiting Medicines:                Monitored Anesthesia Care Procedure:                Pre-Anesthesia Assessment:                           - Prior to the procedure, a History and Physical                            was performed, and patient medications and                            allergies were reviewed. The patient's tolerance of                            previous anesthesia was also reviewed. The risks                            and benefits of the procedure and the sedation                            options and risks were discussed with the patient.                            All questions were answered, and informed consent                            was obtained. Prior Anticoagulants: The patient has                            taken Plavix  (clopidogrel ), last dose was 6 days                            prior to procedure. ASA Grade Assessment: III - A                            patient with severe systemic disease. After                            reviewing the risks and benefits, the patient was                            deemed in satisfactory condition to undergo the                            procedure.  After obtaining informed consent, the endoscope was                            passed under direct vision. Throughout the                            procedure, the patient's blood pressure, pulse, and                            oxygen saturations were monitored continuously. The                            Endoscope was introduced through the mouth, and                            advanced to the second part of  duodenum. The upper                            GI endoscopy was accomplished without difficulty.                            The patient tolerated the procedure well. Scope In: Scope Out: Findings:                 The esophagus was normal.                           The stomach was normal. Small hiatal hernia                           The examined duodenum was normal.                           The cardia and gastric fundus were normal on                            retroflexion. Complications:            No immediate complications. Estimated Blood Loss:     Estimated blood loss: none. Impression:               - Normal esophagus.                           - Normal stomach. Small hiatal hernia                           - Normal examined duodenum.                           - No specimens collected. Recommendation:           - Patient has a contact number available for                            emergencies. The signs and symptoms of potential  delayed complications were discussed with the                            patient. Return to normal activities tomorrow.                            Written discharge instructions were provided to the                            patient.                           - Resume previous diet.                           - Continue present medications. Resume Plavix  today.                           - Good diabetic control is important with nausea                            and vomiting issues [name] N. Abran, MD 03/12/2024 2:05:45 PM This report has been signed electronically.

## 2024-03-12 NOTE — Progress Notes (Signed)
 Expand All Collapse All       Ashley Console, PA-C [address] [place], [place]  [address] Phone: [phone]   Gastroenterology Consultation   Referring Provider:     Janey Santos, MD Primary Care Physician:  Janey Santos, MD Primary Gastroenterologist:  Ashley Console, PA-C / Norleen Kiang, MD  Reason for Consultation:     Repeat colonoscopy, gastroparesis        HPI:   Ashley [name] is a 58 y.o. y/o female referred for consultation & management  by Avva, Ravisankar, MD. She has history of colon polyps and is overdue for repeat colonoscopy.   Current Symptoms: She admits to multiple chronic GI symptoms including generalized abdominal pain, acid reflux, dysphagia, bloating, gas, nausea and vomiting, constipation, diarrhea, and hemorrhoids.  Denies rectal bleeding.  Her husband passed away in 04/01/22 and she has had increased stress and GI symptoms since then.  She tried taking Reglan  10 mg 3 times daily for gastroparesis in 04/01/20.  She does not think it was very helpful.  Denies adverse side effects.  She tries to adhere to a diabetic and gastroparesis diets.  Has insulin  pump managed by PCP Dr. Jude.  Recent A1c 9.4.  She is currently taking Prevacid  30 mg twice daily for acid reflux which helps.   07/2019 last EGD by Dr. Kiang (for nausea and vomiting): Normal.  No biopsies.   07/2019 last colonoscopy by Dr. Kiang: 5 tubular adenoma polyps (1 mm to 8 mm) removed.  No dysplasia.  Internal and external hemorrhoids.  Excellent prep.  3-year repeat (was due 07/2022).   08/2019 gastric emptying study showed gastroparesis: 7% emptied at 1 hr ( normal >= 10%) 54% emptied at 2 hr ( normal >= 40%) 70% emptied at 3 hr ( normal >= 70%) 79% emptied at 4 hr ( normal >= 90%)   PMH: COPD, PAD, sleep apnea.  Hypertension, type II diabetes (on insulin  pump), GERD, gastroparesis, history of esophageal stricture, rheumatoid arthritis, obesity, tobacco abuse.  Currently on aspirin  and Plavix  (Rx by  Dr. Court).       Past Medical History:  Diagnosis Date   Anxiety     COPD (chronic obstructive pulmonary disease) (HCC)     Depression     DOE (dyspnea on exertion)     Dyspnea     Gastroparesis     GERD (gastroesophageal reflux disease)     Glaucoma, both eyes     History of 2018/04/01 novel coronavirus disease (COVID-19) 06/20/2020    hospital admission-- dx severe covid with pneumonia, ARDS, DKA, COPD exaberation;  pt intubated 01-13th and extubated 01-20th;   (11-15-2020  per pt did not go home on oxygen, residual generalized weakness and hair loss)   History of esophageal stricture      post dilatation, followed by dr kiang   History of Graves' disease      s/p RAI  03-03-2010   History of Helicobacter pylori infection      remote hx and treatment   History of kidney stones     Hyperlipidemia     Hypertension      followed by pcp   Hypothyroidism, postradioiodine therapy 02/2010    followed by pcp   IDA (iron deficiency anemia)     Insulin  pump in place      w/ Novolog  , managed by pcp   Moderate asthma      followed by pcp,  (11-15-2020  per pt has pulmonology appt on  11-17-2020))   OSA (obstructive sleep apnea)      11-15-2020  per pt has not used cpap since 05/ 2021  (previous followed by dr chalice,  study in epic 05-12-2018 moderate complex osa pt had used for awhile)   Osteoporosis     PAD (peripheral artery disease) (HCC)      followed by dr berry----  s/p bilateral common iliac artery angioplasty stenting for stenosis   Peripheral neuropathy     Pneumonia      covid 2022   PONV (postoperative nausea and vomiting)     Rheumatoid arthritis (HCC)      in both eyes   Right wrist fracture     Type 1 diabetes mellitus with long-term current use of insulin  (HCC)      per pt dx at age 48 approx,  followed by pcp,  (11-15-2020  per pt last A1c 7.2 in 04/ 2022),  checks blood sugar 6 to 10 times daily , fasting sugar-- 200 -- 300)   Vitamin D  deficiency                  Past Surgical History:  Procedure Laterality Date   ABDOMINAL AORTOGRAM W/LOWER EXTREMITY Bilateral 01/01/2019    Procedure: ABDOMINAL AORTOGRAM W/LOWER EXTREMITY;  Surgeon: Court Dorn PARAS, MD;  Location: [facility];  Service: Cardiovascular;  Laterality: Bilateral;   BREAST EXCISIONAL BIOPSY Left 04-*02-2000  @[facility]     per pt benign   BREAST SURGERY       CATARACT EXTRACTION W/ INTRAOCULAR LENS  IMPLANT, BILATERAL   11/2019   COLONOSCOPY   last one 2019 approx   CYSTOSCOPY WITH STENT PLACEMENT Right 07/22/2021    Procedure: CYSTOSCOPY WITH STENT PLACEMENT;  Surgeon: Francisca Redell BROCKS, MD;  Location: [facility];  Service: Urology;  Laterality: Right;   CYSTOSCOPY/URETEROSCOPY/HOLMIUM LASER/STENT PLACEMENT Right 08/11/2021    Procedure: CYSTOSCOPY/URETEROSCOPY/HOLMIUM LASER/STENT EXCHANGE;  Surgeon: Francisca Redell BROCKS, MD;  Location: [facility];  Service: Urology;  Laterality: Right;   FRACTURE SURGERY       LAPAROSCOPIC ASSISTED VAGINAL HYSTERECTOMY   06-22-2004 @[facility]    NASAL SEPTOPLASTY W/ TURBINOPLASTY Bilateral 09/07/2013    Procedure: SEPTOPLASTY, BILATERAL TURBINATE RESECTION ;  Surgeon: Ana LELON Moccasin, MD;  Location: [facility];  Service: ENT;  Laterality: Bilateral;   OPEN REDUCTION INTERNAL FIXATION (ORIF) DISTAL RADIAL FRACTURE Right 11/16/2020    Procedure: OPEN REDUCTION INTERNAL FIXATION (ORIF) DISTAL RADIAL FRACTURE;  Surgeon: Cristy Bonner DASEN, MD;  Location: [facility];  Service: Orthopedics;  Laterality: Right;   PERIPHERAL VASCULAR INTERVENTION Bilateral 01/01/2019    Procedure: PERIPHERAL VASCULAR INTERVENTION;  Surgeon: Court Dorn PARAS, MD;  Location: [facility];  Service: Cardiovascular;  Laterality: Bilateral;   UPPER GASTROINTESTINAL ENDOSCOPY   last one 07-23-2019  dr abran                 Prior to Admission medications   Medication Sig Start Date End Date Taking? Authorizing Provider  ACCU-CHEK COMPACT PLUS test strip 1 each by  Other route as needed.  10/20/12     [provider]  ACCU-CHEK SOFTCLIX LANCETS lancets   05/19/18     [provider]  acetaminophen  (TYLENOL ) 500 MG tablet Take 1,000 mg by mouth every 6 (six) hours as needed for moderate pain.       [provider]  albuterol  (PROVENTIL ) (2.5 MG/3ML) 0.083% nebulizer solution Take 3 mLs (2.5 mg total) by nebulization every 4 (four)  hours as needed for wheezing or shortness of breath. 06/07/21     Brenna Adine CROME, DO  albuterol  (VENTOLIN  HFA) 108 (90 Base) MCG/ACT inhaler Inhale into the lungs. 03/01/23     [provider]  Albuterol  Sulfate, sensor, (PROAIR  DIGIHALER) 108 (90 Base) MCG/ACT AEPB Inhale 2 puffs into the lungs every 6 (six) hours as needed (shortness of breath).       [provider]  ALPRAZolam  (XANAX ) 0.5 MG tablet Take 0.5-1 mg by mouth 3 (three) times daily as needed for anxiety. 10/08/12     [provider]  AMBULATORY NON FORMULARY MEDICATION NOVOLOG  INSULIN  PUMP USES AS DIRECTED       [provider]  aspirin  EC 81 MG tablet Take 81 mg by mouth daily. Swallow whole.       [provider]  Blood Glucose Monitoring Suppl (ACCU-CHEK AVIVA PLUS) w/Device KIT   06/08/20     [provider]  budesonide  (PULMICORT ) 0.25 MG/2ML nebulizer solution Take 2 mLs (0.25 mg total) by nebulization 2 (two) times daily as needed (wheezing, dyspnea). 09/18/23     Parrett, Madelin RAMAN, NP  clopidogrel  (PLAVIX ) 75 MG tablet Take by mouth. 04/10/19     [provider]  Continuous Glucose Sensor (DEXCOM G7 SENSOR) MISC   08/08/23     [provider]  Fluocinolone Acetonide  0.18 MG IMPL by Intravitreal route. 01/30/22     [provider]  Fluticasone -Umeclidin-Vilant (TRELEGY ELLIPTA ) 100-62.5-25 MCG/ACT AEPB Inhale 1 puff into the lungs daily. 10/04/23     Parrett, Madelin RAMAN, NP  folic acid  (FOLVITE ) 1 MG tablet Take 1 tablet by mouth daily. 06/23/19     [provider]  Insulin  Human (INSULIN  PUMP) SOLN Inject into the skin. Novolog ----  basal rate--- 12 am - 2 am = 1.3, 2 am - 6 am = 1.4, 6 am - 3 pm = 1.8, 3 pm - 12 am = 1.55       [provider]  lansoprazole  (PREVACID ) 30 MG capsule Take 1 capsule (30 mg total) by mouth 2 (two) times daily before a meal. 12/13/20     Abran Norleen SAILOR, MD  latanoprost  (XALATAN ) 0.005 % ophthalmic solution Apply to eye. 02/05/23     [provider]  lidocaine  (XYLOCAINE ) 2 % solution SMARTSIG:15 Milliliter(s) By Mouth 3 Times Daily PRN 03/08/23     [provider]  methotrexate  (RHEUMATREX) 2.5 MG tablet Take 6 tablets (15 mg total) by mouth once a week. 3 tablets am and at bedtime twice a day once a week,  Saturday's Patient taking differently: Take 25 mg by mouth once a week. 5 tablets am and at bedtime twice a day once a week,  Saturday's 08/01/21     Cheryle Page, MD  metoprolol  succinate (TOPROL -XL) 50 MG 24 hr tablet Take 50 mg by mouth daily. Take with or immediately following a meal.       [provider]  montelukast  (SINGULAIR ) 10 MG tablet Take 10 mg by mouth at bedtime.       [provider]  NOVOLOG  100 UNIT/ML injection Inject into the skin See admin instructions. Pt has Insulin  pump 08/06/18     [provider]  pregabalin  (LYRICA ) 75 MG capsule Take 75-150 mg by mouth See admin instructions. Take 75 mg in the morning, 75 mg at lunch, and 150 mg at bedtime 07/24/18     [provider]  rosuvastatin  (CRESTOR ) 40 MG tablet Take 40  mg by mouth daily. 01/07/22     [provider]  sertraline  (ZOLOFT ) 100 MG tablet 2 po qd       [provider]  Vitamin D , Ergocalciferol , (DRISDOL ) 50000 UNITS CAPS Take 50,000 Units by mouth every Saturday.       [provider]           Family History  Problem Relation Age of Onset   Diabetes Father     Heart attack Father     Alcoholism Father     Lung cancer Mother     Liver  cancer Mother     Irritable bowel syndrome Sister     Colon cancer Neg Hx     Esophageal cancer Neg Hx     Rectal cancer Neg Hx     Stomach cancer Neg Hx            Social History  Social History         Tobacco Use   Smoking status: Former      Current packs/day: 0.00      Average packs/day: 0.5 packs/day for 40.0 years (20.0 ttl pk-yrs)      Types: Cigarettes      Start date: 77      Quit date: 06/20/2020      Years since quitting: 3.6   Smokeless tobacco: Never   Tobacco comments:      Started smoking at age [age]  Vaping Use   Vaping status: Never Used  Substance Use Topics   Alcohol  use: Yes      Comment: occasional   Drug use: No             Allergies as of 01/28/2024 - Review Complete 01/28/2024  Allergen Reaction Noted   Mirtazapine Hives and Rash 11/10/2019   Clindamycin Hives 02/28/2009   Macrobid [nitrofurantoin] Hives 06/07/2021   Other Hives 02/17/2018   Sulfonamide derivatives Hives     Alendronate sodium Itching 07/13/2021   Dorzolamide Other (See Comments) 03/18/2023   Fosamax [alendronate]   09/25/2021   Levocetirizine Other (See Comments) 03/18/2023   Lipitor [atorvastatin] Other (See Comments)     Restasis [cyclosporine]   02/17/2018   Sulfa antibiotics Hives 07/13/2021   Topamax [topiramate] Itching 11/17/2018      Review of Systems:    All systems reviewed and negative except where noted in HPI.    Physical Exam:  BP (!) 162/84   Pulse 85   Ht 4' 11 (1.499 m)   Wt 157 lb (71.2 kg)   BMI 31.71 kg/m  No LMP recorded. Patient has had a hysterectomy.   General:   Alert,  Well-developed, obese, well-nourished, pleasant and cooperative in NAD. Lungs:  Respirations even and unlabored.  Clear throughout to auscultation.   No wheezes, crackles, or rhonchi. No acute distress. Heart:  Regular rate and rhythm; no murmurs, clicks, rubs, or gallops. Abdomen:  Normal bowel sounds.  No bruits.  Soft, and obese without masses,  hepatosplenomegaly or hernias noted.  There is moderate generalized abdominal pain throughout entire abdomen to light and deep palpation.   No guarding or rebound tenderness. No peritoneal signs. Neurologic:  Alert and oriented x3;  grossly normal neurologically. Psych:  Alert and cooperative. Normal mood and affect.   Imaging Studies: Imaging Results  No results found.     Labs: CBC Labs (Brief)          Component Value Date/Time    WBC 2.5 (L) 07/11/2023 1132  RBC 3.83 (L) 07/11/2023 1132    HGB 11.3 (L) 07/11/2023 1132    HGB 11.6 01/09/2019 1204    HCT 34.9 (L) 07/11/2023 1132    HCT 36.2 01/09/2019 1204    PLT 128 (L) 07/11/2023 1132    PLT 388 01/09/2019 1204    MCV 91.1 07/11/2023 1132    MCV 82 01/09/2019 1204        CMP     Labs (Brief)          Component Value Date/Time    NA 134 (L) 07/11/2023 1132    NA 137 10/04/2021 1458    K 3.4 (L) 07/11/2023 1132    CL 97 (L) 07/11/2023 1132    CO2 23 07/11/2023 1132    GLUCOSE 119 (H) 07/11/2023 1132    BUN 22 (H) 07/11/2023 1132    BUN 14 10/04/2021 1458    CREATININE 1.11 (H) 07/11/2023 1132    CALCIUM  8.7 (L) 07/11/2023 1132    PROT 7.2 07/11/2023 1132    PROT 6.6 01/09/2019 1204    ALBUMIN 4.2 07/11/2023 1132    ALBUMIN 4.5 01/09/2019 1204    AST 26 07/11/2023 1132    ALT 20 07/11/2023 1132    ALKPHOS 59 07/11/2023 1132    BILITOT 0.9 07/11/2023 1132    BILITOT <0.2 01/09/2019 1204    GFRNONAA 58 (L) 07/11/2023 1132    GFRAA 93 01/30/2019 1558        Assessment and Plan:    Ashley [name] is a 58 y.o. y/o female has been referred for:   1.  Gastroparesis - Rx Reglan  5mg  TID before meals as needed. - Gastroparesis Diet: Low-fat, low fiber, small frequent meals. - Encouraged tight control of blood sugars and diabetes through PCP.   2.  Chronic GERD with history of peptic stricture - Continue Prevacid  30 Mg twice daily. - Scheduling EGD I discussed risks of EGD with patient to include risk  of bleeding, perforation, and risk of sedation.  Patient expressed understanding and agrees to proceed with EGD.    3.  Nausea / Vomiting - Scheduling EGD I discussed risks of EGD with patient to include risk of bleeding, perforation, and risk of sedation.  Patient expressed understanding and agrees to proceed with EGD.    4.  History of adenomatous colon polyps - Scheduling Colonoscopy I discussed risks of colonoscopy with patient to include risk of bleeding, colon perforation, and risk of sedation.  Patient expressed understanding and agrees to proceed with colonoscopy.    5.  Comorbidities:  COPD, PAD, sleep apnea.  Hypertension, type II diabetes (on insulin ), GERD, gastroparesis, history of esophageal stricture, rheumatoid arthritis, obesity, tobacco abuse.  Currently on aspirin  and Plavix . - Requesting permission from Dr. Wadie, vascular, to hold Plavix  5 days prior to EGD and colonoscopy procedures.   Follow up as needed based on EGD and colonoscopy procedures and GI symptoms.   Ashley Console, PA-C      Recent HPI as above.  No interval change.  Now for colonoscopy and upper endoscopy.

## 2024-03-13 ENCOUNTER — Ambulatory Visit (HOSPITAL_BASED_OUTPATIENT_CLINIC_OR_DEPARTMENT_OTHER)
Admission: RE | Admit: 2024-03-13 | Discharge: 2024-03-13 | Disposition: A | Discharge status: Home / Self Care | Payer: Medicare HMO | Source: Ambulatory Visit | Attending: Cardiovascular Disease | Admitting: Cardiovascular Disease

## 2024-03-13 ENCOUNTER — Ambulatory Visit (HOSPITAL_COMMUNITY)
Admission: RE | Admit: 2024-03-13 | Discharge: 2024-03-13 | Disposition: A | Discharge status: Home / Self Care | Payer: Medicare HMO | Source: Ambulatory Visit | Attending: Cardiovascular Disease | Admitting: Cardiovascular Disease

## 2024-03-13 ENCOUNTER — Ambulatory Visit: Payer: Self-pay | Admitting: Cardiovascular Disease

## 2024-03-13 ENCOUNTER — Telehealth: Payer: Self-pay | Admitting: *Deleted

## 2024-03-13 DIAGNOSIS — I739 Peripheral vascular disease, unspecified: Secondary | ICD-10-CM

## 2024-03-13 DIAGNOSIS — H30033 Focal chorioretinal inflammation, peripheral, bilateral: Secondary | ICD-10-CM | POA: Diagnosis not present

## 2024-03-13 DIAGNOSIS — Z95828 Presence of other vascular implants and grafts: Secondary | ICD-10-CM | POA: Diagnosis not present

## 2024-03-13 DIAGNOSIS — Z79899 Other long term (current) drug therapy: Secondary | ICD-10-CM | POA: Diagnosis not present

## 2024-03-13 LAB — VAS US ABI WITH/WO TBI
Left ABI: 1.07
Right ABI: 1.11

## 2024-03-13 NOTE — Telephone Encounter (Signed)
Left message on f/u  

## 2024-03-13 NOTE — Telephone Encounter (Signed)
 Left message on f/u call

## 2024-03-17 ENCOUNTER — Ambulatory Visit: Payer: Self-pay | Admitting: Internal Medicine

## 2024-03-17 LAB — SURGICAL PATHOLOGY

## 2024-03-24 DIAGNOSIS — D126 Benign neoplasm of colon, unspecified: Secondary | ICD-10-CM | POA: Diagnosis not present

## 2024-03-24 DIAGNOSIS — Z23 Encounter for immunization: Secondary | ICD-10-CM | POA: Diagnosis not present

## 2024-03-24 DIAGNOSIS — I10 Essential (primary) hypertension: Secondary | ICD-10-CM | POA: Diagnosis not present

## 2024-03-24 DIAGNOSIS — E1040 Type 1 diabetes mellitus with diabetic neuropathy, unspecified: Secondary | ICD-10-CM | POA: Diagnosis not present

## 2024-03-24 DIAGNOSIS — J439 Emphysema, unspecified: Secondary | ICD-10-CM | POA: Diagnosis not present

## 2024-03-24 DIAGNOSIS — K219 Gastro-esophageal reflux disease without esophagitis: Secondary | ICD-10-CM | POA: Diagnosis not present

## 2024-03-24 DIAGNOSIS — G4733 Obstructive sleep apnea (adult) (pediatric): Secondary | ICD-10-CM | POA: Diagnosis not present

## 2024-03-24 DIAGNOSIS — I739 Peripheral vascular disease, unspecified: Secondary | ICD-10-CM | POA: Diagnosis not present

## 2024-03-24 DIAGNOSIS — K3184 Gastroparesis: Secondary | ICD-10-CM | POA: Diagnosis not present

## 2024-04-13 DIAGNOSIS — K130 Diseases of lips: Secondary | ICD-10-CM | POA: Diagnosis not present

## 2024-04-13 DIAGNOSIS — R059 Cough, unspecified: Secondary | ICD-10-CM | POA: Diagnosis not present

## 2024-04-13 DIAGNOSIS — I10 Essential (primary) hypertension: Secondary | ICD-10-CM | POA: Diagnosis not present

## 2024-04-13 DIAGNOSIS — E1040 Type 1 diabetes mellitus with diabetic neuropathy, unspecified: Secondary | ICD-10-CM | POA: Diagnosis not present

## 2024-04-13 DIAGNOSIS — J029 Acute pharyngitis, unspecified: Secondary | ICD-10-CM | POA: Diagnosis not present

## 2024-04-13 DIAGNOSIS — R399 Unspecified symptoms and signs involving the genitourinary system: Secondary | ICD-10-CM | POA: Diagnosis not present

## 2024-04-13 DIAGNOSIS — B349 Viral infection, unspecified: Secondary | ICD-10-CM | POA: Diagnosis not present

## 2024-04-13 DIAGNOSIS — J439 Emphysema, unspecified: Secondary | ICD-10-CM | POA: Diagnosis not present

## 2024-05-12 DIAGNOSIS — E785 Hyperlipidemia, unspecified: Secondary | ICD-10-CM | POA: Diagnosis not present

## 2024-05-12 DIAGNOSIS — Z4681 Encounter for fitting and adjustment of insulin pump: Secondary | ICD-10-CM | POA: Diagnosis not present

## 2024-05-12 DIAGNOSIS — E1040 Type 1 diabetes mellitus with diabetic neuropathy, unspecified: Secondary | ICD-10-CM | POA: Diagnosis not present

## 2024-05-12 DIAGNOSIS — Z794 Long term (current) use of insulin: Secondary | ICD-10-CM | POA: Diagnosis not present

## 2024-05-12 DIAGNOSIS — I10 Essential (primary) hypertension: Secondary | ICD-10-CM | POA: Diagnosis not present

## 2024-06-28 ENCOUNTER — Other Ambulatory Visit: Payer: Self-pay | Admitting: Physician Assistant
